# Patient Record
Sex: Male | Born: 1955
Health system: Southern US, Community
[De-identification: ages and names within clinical notes are randomized; demographics above are authoritative.]

## PROBLEM LIST (undated history)

## (undated) DIAGNOSIS — Z86718 Personal history of other venous thrombosis and embolism: Secondary | ICD-10-CM

## (undated) DIAGNOSIS — Z8709 Personal history of other diseases of the respiratory system: Secondary | ICD-10-CM

## (undated) DIAGNOSIS — T8859XA Other complications of anesthesia, initial encounter: Secondary | ICD-10-CM

## (undated) DIAGNOSIS — I499 Cardiac arrhythmia, unspecified: Secondary | ICD-10-CM

## (undated) DIAGNOSIS — A4101 Sepsis due to Methicillin susceptible Staphylococcus aureus: Secondary | ICD-10-CM

## (undated) DIAGNOSIS — M19132 Post-traumatic osteoarthritis, left wrist: Secondary | ICD-10-CM

## (undated) DIAGNOSIS — Z87442 Personal history of urinary calculi: Secondary | ICD-10-CM

## (undated) DIAGNOSIS — I8500 Esophageal varices without bleeding: Secondary | ICD-10-CM

## (undated) DIAGNOSIS — K2270 Barrett's esophagus without dysplasia: Secondary | ICD-10-CM

## (undated) DIAGNOSIS — I469 Cardiac arrest, cause unspecified: Secondary | ICD-10-CM

## (undated) DIAGNOSIS — Z8619 Personal history of other infectious and parasitic diseases: Secondary | ICD-10-CM

## (undated) DIAGNOSIS — D696 Thrombocytopenia, unspecified: Secondary | ICD-10-CM

## (undated) DIAGNOSIS — K449 Diaphragmatic hernia without obstruction or gangrene: Secondary | ICD-10-CM

## (undated) DIAGNOSIS — T4145XA Adverse effect of unspecified anesthetic, initial encounter: Secondary | ICD-10-CM

## (undated) DIAGNOSIS — K7581 Nonalcoholic steatohepatitis (NASH): Secondary | ICD-10-CM

## (undated) DIAGNOSIS — Z860101 Personal history of adenomatous and serrated colon polyps: Secondary | ICD-10-CM

## (undated) DIAGNOSIS — I5021 Acute systolic (congestive) heart failure: Secondary | ICD-10-CM

## (undated) DIAGNOSIS — T8453XA Infection and inflammatory reaction due to internal right knee prosthesis, initial encounter: Secondary | ICD-10-CM

## (undated) DIAGNOSIS — R7303 Prediabetes: Secondary | ICD-10-CM

## (undated) DIAGNOSIS — K7460 Unspecified cirrhosis of liver: Secondary | ICD-10-CM

## (undated) DIAGNOSIS — K219 Gastro-esophageal reflux disease without esophagitis: Secondary | ICD-10-CM

## (undated) DIAGNOSIS — M1990 Unspecified osteoarthritis, unspecified site: Secondary | ICD-10-CM

## (undated) DIAGNOSIS — Z9889 Other specified postprocedural states: Secondary | ICD-10-CM

## (undated) DIAGNOSIS — R579 Shock, unspecified: Secondary | ICD-10-CM

## (undated) DIAGNOSIS — Z8674 Personal history of sudden cardiac arrest: Secondary | ICD-10-CM

## (undated) DIAGNOSIS — Z86010 Personal history of colonic polyps: Secondary | ICD-10-CM

## (undated) DIAGNOSIS — K7469 Other cirrhosis of liver: Secondary | ICD-10-CM

## (undated) HISTORY — DX: Shock, unspecified: R57.9

## (undated) HISTORY — PX: TONSILLECTOMY: SUR1361

## (undated) HISTORY — PX: CHOLECYSTECTOMY, LAPAROSCOPIC: SHX56

## (undated) HISTORY — DX: Infection and inflammatory reaction due to internal right knee prosthesis, initial encounter: T84.53XA

## (undated) HISTORY — PX: REVISION TOTAL KNEE ARTHROPLASTY: SUR1280

## (undated) HISTORY — DX: Sepsis due to methicillin susceptible Staphylococcus aureus: A41.01

## (undated) HISTORY — PX: COLONOSCOPY: SHX174

## (undated) HISTORY — PX: CHOLECYSTECTOMY: SHX55

## (undated) HISTORY — PX: TOTAL KNEE ARTHROPLASTY: SHX125

## (undated) HISTORY — PX: ESOPHAGEAL DILATION: SHX303

---
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
## 1997-11-30 HISTORY — PX: INGUINAL HERNIA REPAIR: SUR1180

## 1998-07-02 DIAGNOSIS — Z9889 Other specified postprocedural states: Secondary | ICD-10-CM

## 1998-07-02 HISTORY — DX: Other specified postprocedural states: Z98.890

## 2001-06-28 ENCOUNTER — Emergency Department (HOSPITAL_COMMUNITY): Admission: EM | Admit: 2001-06-28 | Discharge: 2001-06-28 | Payer: Self-pay | Admitting: *Deleted

## 2001-07-06 ENCOUNTER — Emergency Department (HOSPITAL_COMMUNITY): Admission: EM | Admit: 2001-07-06 | Discharge: 2001-07-06 | Payer: Self-pay | Admitting: Emergency Medicine

## 2003-09-27 ENCOUNTER — Emergency Department (HOSPITAL_COMMUNITY): Admission: AD | Admit: 2003-09-27 | Discharge: 2003-09-27 | Payer: Self-pay | Admitting: Family Medicine

## 2006-07-09 ENCOUNTER — Ambulatory Visit: Payer: Self-pay | Admitting: Internal Medicine

## 2006-07-22 ENCOUNTER — Ambulatory Visit: Payer: Self-pay | Admitting: Internal Medicine

## 2006-07-22 ENCOUNTER — Ambulatory Visit (HOSPITAL_COMMUNITY): Admission: RE | Admit: 2006-07-22 | Discharge: 2006-07-22 | Payer: Self-pay | Admitting: Internal Medicine

## 2006-07-22 ENCOUNTER — Encounter (INDEPENDENT_AMBULATORY_CARE_PROVIDER_SITE_OTHER): Payer: Self-pay | Admitting: *Deleted

## 2009-03-03 ENCOUNTER — Ambulatory Visit: Payer: Self-pay | Admitting: Specialist

## 2009-03-16 ENCOUNTER — Inpatient Hospital Stay: Payer: Self-pay | Admitting: Specialist

## 2009-03-21 ENCOUNTER — Inpatient Hospital Stay: Payer: Self-pay | Admitting: Specialist

## 2009-03-23 HISTORY — PX: OTHER SURGICAL HISTORY: SHX169

## 2010-01-30 ENCOUNTER — Inpatient Hospital Stay (HOSPITAL_COMMUNITY): Admission: RE | Admit: 2010-01-30 | Discharge: 2010-02-01 | Payer: Self-pay | Admitting: Orthopedic Surgery

## 2010-01-30 HISTORY — PX: KNEE ARTHROSCOPY W/ SYNOVECTOMY: SHX1887

## 2010-07-02 HISTORY — PX: OTHER SURGICAL HISTORY: SHX169

## 2010-09-12 ENCOUNTER — Encounter (HOSPITAL_COMMUNITY)
Admission: RE | Admit: 2010-09-12 | Discharge: 2010-09-12 | Disposition: A | Discharge status: Home / Self Care | Payer: BC Managed Care – PPO | Source: Ambulatory Visit | Attending: Orthopedic Surgery | Admitting: Orthopedic Surgery

## 2010-09-12 DIAGNOSIS — Z01818 Encounter for other preprocedural examination: Secondary | ICD-10-CM | POA: Insufficient documentation

## 2010-09-12 DIAGNOSIS — Z01812 Encounter for preprocedural laboratory examination: Secondary | ICD-10-CM | POA: Insufficient documentation

## 2010-09-12 LAB — URINALYSIS, ROUTINE W REFLEX MICROSCOPIC
Bilirubin Urine: NEGATIVE
Hgb urine dipstick: NEGATIVE
Nitrite: NEGATIVE
Specific Gravity, Urine: 1.017 (ref 1.005–1.030)
pH: 6 (ref 5.0–8.0)

## 2010-09-12 LAB — COMPREHENSIVE METABOLIC PANEL
ALT: 82 U/L — ABNORMAL HIGH (ref 0–53)
Alkaline Phosphatase: 163 U/L — ABNORMAL HIGH (ref 39–117)
BUN: 8 mg/dL (ref 6–23)
Calcium: 8.9 mg/dL (ref 8.4–10.5)
Chloride: 105 mEq/L (ref 96–112)
Total Protein: 7 g/dL (ref 6.0–8.3)

## 2010-09-12 LAB — PROTIME-INR
INR: 0.89 (ref 0.00–1.49)
Prothrombin Time: 12.2 seconds (ref 11.6–15.2)

## 2010-09-12 LAB — DIFFERENTIAL
Basophils Absolute: 0 10*3/uL (ref 0.0–0.1)
Eosinophils Absolute: 0.3 10*3/uL (ref 0.0–0.7)
Eosinophils Relative: 7 % — ABNORMAL HIGH (ref 0–5)
Lymphocytes Relative: 23 % (ref 12–46)
Lymphs Abs: 0.9 10*3/uL (ref 0.7–4.0)
Monocytes Relative: 11 % (ref 3–12)
Neutro Abs: 2.4 10*3/uL (ref 1.7–7.7)

## 2010-09-12 LAB — SURGICAL PCR SCREEN
MRSA, PCR: NEGATIVE
Staphylococcus aureus: NEGATIVE

## 2010-09-12 LAB — CBC
Hemoglobin: 17.1 g/dL — ABNORMAL HIGH (ref 13.0–17.0)
WBC: 4 10*3/uL (ref 4.0–10.5)

## 2010-09-15 LAB — CBC
HCT: 41.8 % (ref 39.0–52.0)
HCT: 42.4 % (ref 39.0–52.0)
MCH: 30.6 pg (ref 26.0–34.0)
MCH: 32.1 pg (ref 26.0–34.0)
MCHC: 33.7 g/dL (ref 30.0–36.0)
MCHC: 35.6 g/dL (ref 30.0–36.0)
Platelets: 159 10*3/uL (ref 150–400)
Platelets: 167 10*3/uL (ref 150–400)
RBC: 4.61 MIL/uL (ref 4.22–5.81)
RDW: 13.6 % (ref 11.5–15.5)
WBC: 6 10*3/uL (ref 4.0–10.5)

## 2010-09-15 LAB — CROSSMATCH

## 2010-09-15 LAB — BASIC METABOLIC PANEL
BUN: 9 mg/dL (ref 6–23)
BUN: 9 mg/dL (ref 6–23)
CO2: 27 mEq/L (ref 19–32)
CO2: 28 mEq/L (ref 19–32)
Creatinine, Ser: 0.99 mg/dL (ref 0.4–1.5)
GFR calc non Af Amer: >60 mL/min (ref >60)
Potassium: 3.7 mEq/L (ref 3.5–5.1)

## 2010-09-16 LAB — URINALYSIS, ROUTINE W REFLEX MICROSCOPIC
Hgb urine dipstick: NEGATIVE
Ketones, ur: NEGATIVE mg/dL
Protein, ur: NEGATIVE mg/dL
Specific Gravity, Urine: 1.021 (ref 1.005–1.030)
pH: 7 (ref 5.0–8.0)

## 2010-09-16 LAB — CBC
HCT: 47.9 % (ref 39.0–52.0)
MCV: 91.7 fL (ref 78.0–100.0)
RBC: 5.22 MIL/uL (ref 4.22–5.81)
RDW: 14.2 % (ref 11.5–15.5)
WBC: 4.8 10*3/uL (ref 4.0–10.5)

## 2010-09-16 LAB — COMPREHENSIVE METABOLIC PANEL
AST: 32 U/L (ref 0–37)
Alkaline Phosphatase: 92 U/L (ref 39–117)
CO2: 31 mEq/L (ref 19–32)
Calcium: 9.1 mg/dL (ref 8.4–10.5)
GFR calc non Af Amer: >60 mL/min (ref >60)
Glucose, Bld: 107 mg/dL — ABNORMAL HIGH (ref 70–99)
Sodium: 141 mEq/L (ref 135–145)

## 2010-09-16 LAB — SURGICAL PCR SCREEN
MRSA, PCR: NEGATIVE
Staphylococcus aureus: NEGATIVE

## 2010-09-16 LAB — APTT: aPTT: 27 seconds (ref 24–37)

## 2010-09-16 LAB — DIFFERENTIAL
Basophils Absolute: 0 10*3/uL (ref 0.0–0.1)
Basophils Relative: 1 % (ref 0–1)
Monocytes Relative: 9 % (ref 3–12)

## 2010-09-16 LAB — PROTIME-INR: Prothrombin Time: 12.7 seconds (ref 11.6–15.2)

## 2010-09-16 LAB — URINE CULTURE: Culture: NO GROWTH

## 2010-09-18 ENCOUNTER — Inpatient Hospital Stay (HOSPITAL_COMMUNITY)
Admission: RE | Admit: 2010-09-18 | Discharge: 2010-09-20 | DRG: 209 | Disposition: A | Discharge status: Home Health | Payer: BC Managed Care – PPO | Source: Ambulatory Visit | Attending: Orthopedic Surgery | Admitting: Orthopedic Surgery

## 2010-09-18 DIAGNOSIS — Z888 Allergy status to other drugs, medicaments and biological substances status: Secondary | ICD-10-CM

## 2010-09-18 DIAGNOSIS — K219 Gastro-esophageal reflux disease without esophagitis: Secondary | ICD-10-CM | POA: Diagnosis present

## 2010-09-18 DIAGNOSIS — Z01812 Encounter for preprocedural laboratory examination: Secondary | ICD-10-CM

## 2010-09-18 DIAGNOSIS — D62 Acute posthemorrhagic anemia: Secondary | ICD-10-CM | POA: Diagnosis not present

## 2010-09-18 DIAGNOSIS — Z96659 Presence of unspecified artificial knee joint: Secondary | ICD-10-CM

## 2010-09-18 DIAGNOSIS — Z880 Allergy status to penicillin: Secondary | ICD-10-CM

## 2010-09-18 DIAGNOSIS — M1710 Unilateral primary osteoarthritis, unspecified knee: Principal | ICD-10-CM | POA: Diagnosis present

## 2010-09-18 LAB — CROSSMATCH
Antibody Screen: NEGATIVE
Unit division: 0

## 2010-09-18 LAB — HEPATIC FUNCTION PANEL
ALT: 60 U/L — ABNORMAL HIGH (ref 0–53)
Alkaline Phosphatase: 167 U/L — ABNORMAL HIGH (ref 39–117)
Indirect Bilirubin: 0.8 mg/dL (ref 0.3–0.9)

## 2010-09-19 LAB — CBC
HCT: 40.3 % (ref 39.0–52.0)
Hemoglobin: 13.8 g/dL (ref 13.0–17.0)
MCHC: 34.2 g/dL (ref 30.0–36.0)
RBC: 4.53 MIL/uL (ref 4.22–5.81)

## 2010-09-19 LAB — BASIC METABOLIC PANEL
CO2: 30 mEq/L (ref 19–32)
Calcium: 7.9 mg/dL — ABNORMAL LOW (ref 8.4–10.5)
Chloride: 103 mEq/L (ref 96–112)
GFR calc Af Amer: >60 mL/min (ref >60)
Glucose, Bld: 105 mg/dL — ABNORMAL HIGH (ref 70–99)
Sodium: 137 mEq/L (ref 135–145)

## 2010-09-20 LAB — BASIC METABOLIC PANEL
BUN: 7 mg/dL (ref 6–23)
CO2: 31 mEq/L (ref 19–32)
Calcium: 8.2 mg/dL — ABNORMAL LOW (ref 8.4–10.5)
Chloride: 102 mEq/L (ref 96–112)
Creatinine, Ser: 1.01 mg/dL (ref 0.4–1.5)
Glucose, Bld: 112 mg/dL — ABNORMAL HIGH (ref 70–99)

## 2010-09-20 LAB — CBC
HCT: 37 % — ABNORMAL LOW (ref 39.0–52.0)
Hemoglobin: 12.7 g/dL — ABNORMAL LOW (ref 13.0–17.0)
MCH: 30.2 pg (ref 26.0–34.0)
MCHC: 34.3 g/dL (ref 30.0–36.0)
MCV: 87.9 fL (ref 78.0–100.0)
RBC: 4.21 MIL/uL — ABNORMAL LOW (ref 4.22–5.81)

## 2010-09-21 LAB — CROSSMATCH: Unit division: 0

## 2010-10-10 NOTE — Op Note (Signed)
NAME:  Stephen Burch, [place]                ACCOUNT NO.:  [id number]  MEDICAL RECORD NO.:  [id number]           PATIENT TYPE:  I  LOCATION:  5002                         FACILITY:  [facility]  PHYSICIAN:  Mila Homer. Sherlean Foot, M.D. DATE OF BIRTH:  [birth year]  DATE OF PROCEDURE:  09/18/2010 DATE OF DISCHARGE:  09/20/2010                              OPERATIVE REPORT   SURGEON:  Mila Homer. Sherlean Foot, MD.  ASSISTANT:  Altamese Cabal, PA-C.  ANESTHESIA:  General.  PREOPERATIVE DIAGNOSIS:  Left knee osteoarthritis.  POSTOPERATIVE DIAGNOSIS:  Left knee osteoarthritis.  PROCEDURE:  Left total knee arthroplasty.  INDICATIONS FOR PROCEDURE:  The patient is a 55 year old white male with failure of conservative measures for osteoarthritis of the left knee. Informed consent was obtained.  DESCRIPTION OF PROCEDURE:  The patient was laid supine, administered general anesthesia.  The left leg was prepped and draped in usual sterile fashion.  Left leg exsanguinated with the Esmarch and tourniquet inflated to 350 mmHg.  A #10 blade was used to make a midline incision and a new blade to make a median parapatellar arthrotomy and performed a synovectomy.  I then elevated the deep MCL off the medial crest of the tibia around to and through the semimembranosus attachment since this was a bad varus knee.  I then everted the patella, it measured 25-mm thick.  I reamed down 8.5 mm with a reamer and drilled 3 lug holes through the 35-mm template with a prosthetic trial in place and recreated a 25-mm thickness.  I then removed the prosthetic trial, subluxed the patella laterally and went into flexion.  I used the extramedullary alignment system on the tibia to make perpendicular cut to the anatomic axis of the tibia and 3 degrees of posterior slope.  I then used the intramedullary system on the femur to make a 6-degree valgus cut as this again was a varus knee.  I then marked out the epicondylar axis and the posterior  condylar angle measured 3 degrees.  I sized to size E pinned through the 3 degrees external rotation holes and passed the formal cutting block to the femur.  I then made the anterior- posterior chamfer cuts with sagittal saw.  I then placed a lamina spreader in the knee of the ACL, PCL, medial lateral menisci, and posterior condylar osteophytes.  I then placed a 12-mm spacer block and had good flexion/extension gap balance.  I did have to drill a little bit more medial releasing distally.  I then finished size E finishing block, the tibia with a size 6 tibial tray drilling keel.  I then trialed with a E femur, 6 tibia, 12 insert, 35 patella, had good flexion/extension gap balance and good patellar tracking.  I then removed the components and copiously irrigated.  I then cemented in the components, removing all excess cement, and allowing the cement to harden in extension.  I then let the tourniquet down and obtained hemostasis.  I left the Hemovac coming out superolaterally and deep to the arthrotomy.  Pain catheter coming out supermedial and superficial to the arthrotomy.  I then closed the arthrotomy with figure-of-eight #  1 Vicryl sutures, buried 0 Vicryl sutures, subcuticular 2-0 Vicryl stitch, and skin staples.  Dressed with Xeroform, dressing sponges, sterile Webril, and TED stocking.  COMPLICATIONS:  None.  DRAINS:  One Hemovac.  ESTIMATED BLOOD LOSS:  300 mL.          ______________________________ Mila Homer. Sherlean Foot, M.D.     SDL/MEDQ  D:  09/22/2010  T:  09/23/2010  Job:  [id number]  Electronically Signed by Georgena Spurling M.D. on 10/10/2010 02:17:54 PM

## 2010-10-18 ENCOUNTER — Ambulatory Visit (HOSPITAL_COMMUNITY)
Admission: RE | Admit: 2010-10-18 | Discharge: 2010-10-18 | Disposition: A | Discharge status: Home / Self Care | Payer: BC Managed Care – PPO | Source: Ambulatory Visit | Attending: Orthopedic Surgery | Admitting: Orthopedic Surgery

## 2010-10-18 DIAGNOSIS — M6281 Muscle weakness (generalized): Secondary | ICD-10-CM | POA: Insufficient documentation

## 2010-10-18 DIAGNOSIS — IMO0001 Reserved for inherently not codable concepts without codable children: Secondary | ICD-10-CM | POA: Insufficient documentation

## 2010-10-18 DIAGNOSIS — R262 Difficulty in walking, not elsewhere classified: Secondary | ICD-10-CM | POA: Insufficient documentation

## 2010-10-18 DIAGNOSIS — M25569 Pain in unspecified knee: Secondary | ICD-10-CM | POA: Insufficient documentation

## 2010-10-18 DIAGNOSIS — M25669 Stiffness of unspecified knee, not elsewhere classified: Secondary | ICD-10-CM | POA: Insufficient documentation

## 2010-10-20 ENCOUNTER — Ambulatory Visit (HOSPITAL_COMMUNITY)
Admission: RE | Admit: 2010-10-20 | Discharge: 2010-10-20 | Disposition: A | Discharge status: Home / Self Care | Payer: BC Managed Care – PPO | Source: Ambulatory Visit

## 2010-10-23 ENCOUNTER — Ambulatory Visit (HOSPITAL_COMMUNITY)
Admission: RE | Admit: 2010-10-23 | Discharge: 2010-10-23 | Disposition: A | Discharge status: Home / Self Care | Payer: BC Managed Care – PPO | Source: Ambulatory Visit | Attending: Family Medicine | Admitting: Family Medicine

## 2010-10-25 ENCOUNTER — Ambulatory Visit (HOSPITAL_COMMUNITY): Payer: BC Managed Care – PPO | Admitting: Physical Therapy

## 2010-10-27 ENCOUNTER — Ambulatory Visit (HOSPITAL_COMMUNITY): Payer: BC Managed Care – PPO | Admitting: Physical Therapy

## 2010-10-30 ENCOUNTER — Ambulatory Visit (HOSPITAL_COMMUNITY): Payer: BC Managed Care – PPO | Admitting: *Deleted

## 2010-11-01 ENCOUNTER — Ambulatory Visit (HOSPITAL_COMMUNITY): Payer: BC Managed Care – PPO | Admitting: Physical Therapy

## 2010-11-03 ENCOUNTER — Ambulatory Visit (HOSPITAL_COMMUNITY): Payer: BC Managed Care – PPO | Admitting: Physical Therapy

## 2010-11-06 ENCOUNTER — Ambulatory Visit (HOSPITAL_COMMUNITY)
Admission: RE | Admit: 2010-11-06 | Discharge: 2010-11-06 | Disposition: A | Discharge status: Home / Self Care | Payer: BC Managed Care – PPO | Source: Ambulatory Visit | Attending: Orthopedic Surgery | Admitting: Orthopedic Surgery

## 2010-11-06 DIAGNOSIS — M25569 Pain in unspecified knee: Secondary | ICD-10-CM | POA: Insufficient documentation

## 2010-11-06 DIAGNOSIS — R262 Difficulty in walking, not elsewhere classified: Secondary | ICD-10-CM | POA: Insufficient documentation

## 2010-11-06 DIAGNOSIS — M25669 Stiffness of unspecified knee, not elsewhere classified: Secondary | ICD-10-CM | POA: Insufficient documentation

## 2010-11-06 DIAGNOSIS — M6281 Muscle weakness (generalized): Secondary | ICD-10-CM | POA: Insufficient documentation

## 2010-11-06 DIAGNOSIS — IMO0001 Reserved for inherently not codable concepts without codable children: Secondary | ICD-10-CM | POA: Insufficient documentation

## 2010-11-09 ENCOUNTER — Ambulatory Visit (HOSPITAL_COMMUNITY)
Admission: RE | Admit: 2010-11-09 | Discharge: 2010-11-09 | Disposition: A | Discharge status: Home / Self Care | Payer: BC Managed Care – PPO | Source: Ambulatory Visit | Attending: Orthopedic Surgery | Admitting: Orthopedic Surgery

## 2010-11-09 DIAGNOSIS — M25669 Stiffness of unspecified knee, not elsewhere classified: Secondary | ICD-10-CM | POA: Insufficient documentation

## 2010-11-09 DIAGNOSIS — M25569 Pain in unspecified knee: Secondary | ICD-10-CM | POA: Insufficient documentation

## 2010-11-09 DIAGNOSIS — IMO0001 Reserved for inherently not codable concepts without codable children: Secondary | ICD-10-CM | POA: Insufficient documentation

## 2010-11-09 DIAGNOSIS — R262 Difficulty in walking, not elsewhere classified: Secondary | ICD-10-CM | POA: Insufficient documentation

## 2010-11-09 DIAGNOSIS — M6281 Muscle weakness (generalized): Secondary | ICD-10-CM | POA: Insufficient documentation

## 2010-11-17 NOTE — Op Note (Signed)
NAME:  Stephen Burch, Stephen Burch                ACCOUNT NO.:  [id number]   MEDICAL RECORD NO.:  [id number]          PATIENT TYPE:  AMB   LOCATION:  DAY                           FACILITY:  [facility]   PHYSICIAN:  Lionel December, M.D.    DATE OF BIRTH:  1956/02/22   DATE OF PROCEDURE:  07/22/2006  DATE OF DISCHARGE:                               OPERATIVE REPORT   PROCEDURE:  Esophagogastroduodenoscopy with esophageal dilation followed  by colonoscopy with polypectomy.   INDICATION:  Stephen Burch is a 55 year old Caucasian male who presents with  few months history of solid food dysphagia.  He denies heartburn.  His  last EGD was in 1995 and he had distal esophageal ring, erosive reflux  esophagitis and small sliding hiatal hernia.  However, he did not take  PPI for long.  He is also undergoing screening colonoscopy.  Procedure  risks were reviewed with the patient, informed consent was obtained.   MEDS FOR CONSCIOUS SEDATION:  Benzocaine spray for pharyngeal topical  anesthesia, Demerol 50 mg IV, Versed 8 mg IV in divided dose.   FINDINGS:  Procedure performed in endoscopy suite.  The patient's vital  signs and O2 sat were monitored during procedure remained stable.   PROCEDURE:  1. Esophagogastroduodenoscopy. The patient was placed left lateral      position and Pentax videoscope was passed via oropharynx without      any difficulty into esophagus.   Esophagus. Mucosa of the esophagus normal.  There is prominent ring  noted at GE junction and two rings proximal to it which were noncritical  and incomplete.  GE junction was at 38 cm from the incisors and hiatus  was at 41.  Mucosa of the herniated part of the stomach was erythematous  and there was 5-6 mm ulcer just below the ring involving the gastric  mucosa.  Pictures taken for the record.   Stomach was empty and distended very well with insufflation.  Folds  proximal stomach were normal.  Examination mucosa revealed a 5-mm polyp  at gastric body  along the posterior wall which was ablated via cold  biopsy.  There was patchy antral erythema and some granularity but no  erosions or ulcers noted.  Pyloric channel was patent.  Angularis,  fundus and cardia examined by retroflexing the scope and were normal.   Duodenum. Bulbar mucosa was normal.  Scope was passed second part of  duodenum where mucosa and folds were normal.  Ampulla of Vater was also  easily seen and appeared to be unremarkable.   Endoscope was withdrawn in body of the stomach.  Balloon dilator was  passed through the scope.  The guidewire was pushed in the gastric  lumen.  Scope was withdrawn in the body of the esophagus and this ring  was dilated initially to 18 mm and subsequently to 19 mm with the  balloon.  This seemed to disrupt the ring.  Pictures taken for record.  Endoscope was withdrawn.  The patient prepared for procedure #2.   1. Colonoscopy.  Rectal examination performed.  No abnormality noted  on external or digital exam.  Pentax videoscope was placed in      rectum and advanced under vision into sigmoid colon beyond.      Preparation was satisfactory.  Scope was passed into cecum which      was identified by appendiceal orifice and ileocecal valve.      Pictures taken for the record.  As the scope was withdrawn colonic      mucosa was carefully examined.  There was 6 x 8 mm sessile polyp at      hepatic flexure which was snared after elevating it with submucosal      injection of normal saline.  Polypectomy was complete.  There was      oozing noted from polypectomy site which was coagulated with the      snare tip with good hemostasis.  Pictures taken for the record.      Mucosa rest of the colon was carefully examined and no other      abnormalities noted.  Rectal mucosa was normal.  Scope was      retroflexed to examine anorectal junction which was unremarkable.      Endoscope was then straightened and withdrawn.  The patient      tolerated the  procedure well.   FINAL DIAGNOSIS:  1. Distal esophageal ring dilated/disrupted with 18/19 mL balloon.  2. Small sliding hiatal hernia with 5-6 mm ulcer involving the      herniated part of the stomach just below the ring.  This      endoscopically appeared to be benign and was not biopsied.  3. A 5 mm gastric polyp at body ablated via cold biopsy.  4. Nonerosive antral gastritis and bulbar duodenitis.  5. 6 x 8 mm sessile polyp snared from hepatic flexure as described      above.   RECOMMENDATIONS:  1. Instructions given.  2. Antireflux measures.  3. Omeprazole 20 mg p.o. b.i.d.  4. He will return for follow-up EGD in 12 weeks.  Once his esophagitis      has healed completely we will drop PPI dose to once a day.  5. H pylori serology will be checked today.      Lionel December, M.D.  Electronically Signed     NR/MEDQ  D:  07/22/2006  T:  07/22/2006  Job:  [id number]   cc:   Dr. Sherrie Mustache [facility]

## 2010-11-17 NOTE — H&P (Signed)
NAME:  Stephen Burch, Stephen Burch                ACCOUNT NO.:  [id number]   MEDICAL RECORD NO.:  [id number]         PATIENT TYPE:  AMB   LOCATION:                                FACILITY:  [facility]   PHYSICIAN:  Lionel December, M.D.    DATE OF BIRTH:  09-18-1955   DATE OF ADMISSION:  DATE OF DISCHARGE:  LH                              HISTORY & PHYSICAL   CHIEF COMPLAINT:  Dysphagia, screening colonoscopy.   HISTORY OF PRESENT ILLNESS:  Stephen Burch is a 55 year old Caucasian male  who has a history of esophageal dysphagia, with a last EGD in 1995.  He  was found to have a distal esophageal ring, along with erosive reflux  esophagitis and a small sliding hiatal hernia.  His esophagus was  dilated to 18 mm using a balloon dilator.  He has not had any further  problems until approximately three weeks ago.  He began to develop  dysphagia to both solids and liquids.  He feels as though his food is  getting stuck mid-esophagus.  He has occasionally had to make himself  regurgitate his food, as he felt like it was not going to go down.  He  denies any odynophagia.  He denies anorexia.  He denies any heartburn or  indigestion.  He does have daily soft brown bowel movements.  He denies  rectal bleeding or melena.   PAST MEDICAL HISTORY:  Chronic GERD with dysphagia, see HPI, bilateral  inguinal herniorrhaphies, tonsillectomy.   CURRENT MEDICATIONS:  Denies.   ALLERGIES:  Denies any allergies to aspirin or penicillin.   FAMILY HISTORY:  No known family history of colorectal carcinoma, liver  or GI problems.  Mother age 31 is healthy.  Father age 55 has arthritis.  He has three healthy siblings.   SOCIAL HISTORY:  He is married with two healthy grandchildren.  He is  employed with [employer]. He denies any tobacco or drug use.  He  occasionally consumes a couple alcoholic beverages per month.   REVIEW OF SYSTEMS:  CONSTITUTIONAL:  Weight is stable, denies any  fatigue.  CARDIOVASCULAR:  Denies any chest  pain or palpitations.  PULMONARY:  Denies shortness of breath, dizziness, cough or hemoptysis.  GI:  See HPI, denies any diarrhea, constipation, or abdominal pain.  GU:  Denies any dysuria, hematuria.   PHYSICAL EXAMINATION:  VITAL SIGNS:  Weight 201, height 72, temp 98.2,  blood pressure 120/82 and pulse 70.  GENERAL:  Stephen Burch is a well-developed, well-nourished, Caucasian male  in no acute distress.  HEENT:  __________ are clear, nonicteric sclerae, conjunctivae pink.  Oropharynx pink and moist without any lesions.  NECK:  Supple without mass or thyromegaly.  HEART:  Regular rate and rhythm. Normal S1, S2.  ABDOMEN:  Positive bowel sounds x4.  No bruits auscultated, soft  nontender, nondistended without palpable mass or hepatosplenomegaly, no  rebound tenderness or guarding.  EXTREMITIES:  Without clubbing or edema bilaterally.  SKIN:  Pink, warm and dry, without any rashes or jaundice.   IMPRESSION:  Stephen Burch is a 55 year old Caucasian male with a recurrent  esophageal dysphagia.  I suspect recurrent esophageal ring.  It has been  over 12 years since he was last dilated.  He also would like to have  screening colonoscopy at the same time.   PLAN:  1. EGD with ED and screening colonoscopy with Dr. Karilyn Cota in the near      future.  We have discussed this procedure including risks and      benefits which include, but are not limited to, the risk of      bleeding, infection, perforation, drug reaction.  He agrees with      plan, consent obtained.  2. Further recommendations pending procedure.      Nicholas Lose, N.P.      Lionel December, M.D.  Electronically Signed    KC/MEDQ  D:  07/09/2006  T:  07/09/2006  Job:  [id number]

## 2010-12-21 ENCOUNTER — Ambulatory Visit (HOSPITAL_COMMUNITY)
Admission: RE | Admit: 2010-12-21 | Discharge: 2010-12-21 | Disposition: A | Discharge status: Home / Self Care | Payer: BC Managed Care – PPO | Source: Ambulatory Visit | Attending: Orthopedic Surgery | Admitting: Orthopedic Surgery

## 2010-12-21 DIAGNOSIS — IMO0001 Reserved for inherently not codable concepts without codable children: Secondary | ICD-10-CM | POA: Insufficient documentation

## 2010-12-21 DIAGNOSIS — M25569 Pain in unspecified knee: Secondary | ICD-10-CM | POA: Insufficient documentation

## 2010-12-21 DIAGNOSIS — M25669 Stiffness of unspecified knee, not elsewhere classified: Secondary | ICD-10-CM | POA: Insufficient documentation

## 2010-12-21 DIAGNOSIS — R262 Difficulty in walking, not elsewhere classified: Secondary | ICD-10-CM | POA: Insufficient documentation

## 2010-12-21 DIAGNOSIS — M6281 Muscle weakness (generalized): Secondary | ICD-10-CM | POA: Insufficient documentation

## 2010-12-27 ENCOUNTER — Ambulatory Visit (HOSPITAL_COMMUNITY)
Admission: RE | Admit: 2010-12-27 | Discharge: 2010-12-27 | Disposition: A | Discharge status: Home / Self Care | Payer: BC Managed Care – PPO | Source: Ambulatory Visit | Attending: Family Medicine | Admitting: Family Medicine

## 2011-01-01 ENCOUNTER — Ambulatory Visit (HOSPITAL_COMMUNITY)
Admission: RE | Admit: 2011-01-01 | Discharge: 2011-01-01 | Disposition: A | Discharge status: Home / Self Care | Payer: BC Managed Care – PPO | Source: Ambulatory Visit | Attending: Orthopedic Surgery | Admitting: Orthopedic Surgery

## 2011-01-01 DIAGNOSIS — M6281 Muscle weakness (generalized): Secondary | ICD-10-CM | POA: Insufficient documentation

## 2011-01-01 DIAGNOSIS — M25669 Stiffness of unspecified knee, not elsewhere classified: Secondary | ICD-10-CM | POA: Insufficient documentation

## 2011-01-01 DIAGNOSIS — M25569 Pain in unspecified knee: Secondary | ICD-10-CM | POA: Insufficient documentation

## 2011-01-01 DIAGNOSIS — R262 Difficulty in walking, not elsewhere classified: Secondary | ICD-10-CM | POA: Insufficient documentation

## 2011-01-01 DIAGNOSIS — IMO0001 Reserved for inherently not codable concepts without codable children: Secondary | ICD-10-CM | POA: Insufficient documentation

## 2011-01-09 ENCOUNTER — Ambulatory Visit (HOSPITAL_COMMUNITY)
Admission: RE | Admit: 2011-01-09 | Discharge: 2011-01-09 | Disposition: A | Discharge status: Home / Self Care | Payer: BC Managed Care – PPO | Source: Ambulatory Visit | Attending: Family Medicine | Admitting: Family Medicine

## 2011-01-09 DIAGNOSIS — R262 Difficulty in walking, not elsewhere classified: Secondary | ICD-10-CM | POA: Insufficient documentation

## 2011-01-09 DIAGNOSIS — M25569 Pain in unspecified knee: Secondary | ICD-10-CM | POA: Insufficient documentation

## 2011-01-09 NOTE — Progress Notes (Signed)
Physical Therapy Treatment Patient Name: Stephen Burch Date: 01/09/2011  Subjective: pain free at entrance   OBJECTIVE: PROM 0-118 degrees  Exercise/Treatments: Gastrocnemius st: 3x 30", Soleus st: 3x30", Biodex isokinetic at 45 degrees/second with 2x 10 reps., wall slides 10x 10" holds, Forward lunge walking 2 RT for balance, heel/toe walking 2RT for balance, Added tandem stance for balance, Supine TKE 10x10" for increased ROM, heel slides x 20 reps, with PROM following 3x30" holds each direction with PROM Measurement 0-118 degrees.  Assessment: Added tandem gait for increase balance, pt. able to regain 2 LOB episodes independently without Assistance.  All therapeutic exercises/balance activities completed without difficulty.  Plan: Re-eval next session.   End of Session There is no problem list on file for this patient.      Aldona Lento 01/09/2011, 8:10 AM

## 2011-01-16 ENCOUNTER — Ambulatory Visit (HOSPITAL_COMMUNITY)
Admission: RE | Admit: 2011-01-16 | Discharge: 2011-01-16 | Disposition: A | Discharge status: Home / Self Care | Payer: BC Managed Care – PPO | Source: Ambulatory Visit | Attending: Family Medicine | Admitting: Family Medicine

## 2011-01-16 DIAGNOSIS — R262 Difficulty in walking, not elsewhere classified: Secondary | ICD-10-CM | POA: Insufficient documentation

## 2011-01-16 DIAGNOSIS — M25669 Stiffness of unspecified knee, not elsewhere classified: Secondary | ICD-10-CM

## 2011-01-16 DIAGNOSIS — M25569 Pain in unspecified knee: Secondary | ICD-10-CM | POA: Insufficient documentation

## 2011-01-16 NOTE — Progress Notes (Signed)
Physical Therapy Treatment Patient Name: BENTLIE CATANZARO VPXTG'G Date: 01/16/2011 Time In: 801 Time Out 840 Charges: 1 ROM, 25 TE Physical Therapy Discharge Summary  Diagnosis: L TKR after manipulation ICD-9 YIRS:854.62, 719.56 Referring practitioner: Lucey Date of last MD visit: 01/06/11 Date of initial PT Visit:  Patient seen for 10 sessions after manipulation at this location   Subjective:    Patient's response to therapy: Pt reports that his pain is no greater then a 2/10.  He can stand for a few hours without an increase in pain, he can walk for at least an hour, no difficulty driving or sitting.    Objective:   Current condition:  LLE:  MMT:WNL AROM : 0-108 degrees PROM 0-118 degrees Balance: L SLS: 30 sec.  R SLS: 20 sec. Power: 5 Sit to stands: 9.6 sec, Stairs: reciprocal w/1 handrail, with decreased speed with descending stairs, Sqauts 5 times without pain.     Gait: Continues to have decreased knee flexion with gait.   Flexibility: Pt has improved hamstring and ITB length.     Edema: Not present   Assessment:   Summary/analysis of evaluation: Pt has improved strength, flexibility, power, balance and education on how to continue w/HEP at home.  Pt continues to have difficulty with AROM during gait.   Progress toward previous goals: Pt has met all STG and LTG for therapy.    Plan:   D/C w/comprehensive HEP   Exercise:  Bike x6 minutes 3.0  STANDING:   [place] 1x ascending and descending with 1 handrail  Heel Raise w/L eccentric lowering 2x10  Gastroc Stretch 3x30 sec  Chair Stretch 3x30 sec.   5 STS: 9.4 sec.  R SLS: 20 sec.  L SLS 30 sec.   Heel Walks 3 RT  Toe Walks 3 RT PRONE:   Knee flexion 5# 3x10  SUPINE:  Active Hamstring Stretch 3x30 sec.   ITB stretch 3x30 sec     Goals PT Short Term Goals Short Term Goal 1 Progress: Met Short Term Goal 3 Progress: Met PT Long Term Goals Long Term Goal 2 Progress: Met End of Session Patient Active Problem  List  Diagnoses  . Pain in joint, lower leg  . Stiffness of joint, not elsewhere classified, lower leg  . Difficulty in walking       Tanina Barb 01/16/2011, 8:16 AM

## 2012-06-23 ENCOUNTER — Other Ambulatory Visit (HOSPITAL_COMMUNITY): Payer: Self-pay | Admitting: Urology

## 2012-06-23 DIAGNOSIS — I861 Scrotal varices: Secondary | ICD-10-CM

## 2012-06-27 ENCOUNTER — Ambulatory Visit (HOSPITAL_COMMUNITY)
Admission: RE | Admit: 2012-06-27 | Discharge: 2012-06-27 | Disposition: A | Discharge status: Home / Self Care | Payer: Medicare Other | Source: Ambulatory Visit | Attending: Urology | Admitting: Urology

## 2012-06-27 DIAGNOSIS — K7460 Unspecified cirrhosis of liver: Secondary | ICD-10-CM | POA: Insufficient documentation

## 2012-06-27 DIAGNOSIS — R300 Dysuria: Secondary | ICD-10-CM | POA: Insufficient documentation

## 2012-06-27 DIAGNOSIS — K8020 Calculus of gallbladder without cholecystitis without obstruction: Secondary | ICD-10-CM | POA: Insufficient documentation

## 2012-06-27 DIAGNOSIS — I861 Scrotal varices: Secondary | ICD-10-CM | POA: Insufficient documentation

## 2012-06-27 DIAGNOSIS — K4090 Unilateral inguinal hernia, without obstruction or gangrene, not specified as recurrent: Secondary | ICD-10-CM | POA: Insufficient documentation

## 2012-06-27 DIAGNOSIS — N200 Calculus of kidney: Secondary | ICD-10-CM | POA: Insufficient documentation

## 2012-06-27 MED ORDER — IOHEXOL 300 MG/ML  SOLN
100.0000 mL | Freq: Once | INTRAMUSCULAR | Status: AC | PRN
  Administered 2012-06-27: 100 mL via INTRAVENOUS

## 2012-07-04 ENCOUNTER — Emergency Department (HOSPITAL_COMMUNITY): Payer: Medicare Other

## 2012-07-04 ENCOUNTER — Encounter (HOSPITAL_COMMUNITY): Payer: Self-pay | Admitting: Emergency Medicine

## 2012-07-04 ENCOUNTER — Emergency Department (HOSPITAL_COMMUNITY)
Admission: EM | Admit: 2012-07-04 | Discharge: 2012-07-04 | Disposition: A | Discharge status: Home / Self Care | Payer: Medicare Other | Attending: Emergency Medicine | Admitting: Emergency Medicine

## 2012-07-04 DIAGNOSIS — J189 Pneumonia, unspecified organism: Secondary | ICD-10-CM

## 2012-07-04 DIAGNOSIS — J45909 Unspecified asthma, uncomplicated: Secondary | ICD-10-CM | POA: Insufficient documentation

## 2012-07-04 DIAGNOSIS — R059 Cough, unspecified: Secondary | ICD-10-CM | POA: Insufficient documentation

## 2012-07-04 DIAGNOSIS — R05 Cough: Secondary | ICD-10-CM | POA: Insufficient documentation

## 2012-07-04 DIAGNOSIS — Z87442 Personal history of urinary calculi: Secondary | ICD-10-CM | POA: Insufficient documentation

## 2012-07-04 DIAGNOSIS — R509 Fever, unspecified: Secondary | ICD-10-CM | POA: Insufficient documentation

## 2012-07-04 LAB — COMPREHENSIVE METABOLIC PANEL
ALT: 42 U/L (ref 0–53)
Albumin: 3.6 g/dL (ref 3.5–5.2)
Calcium: 8.2 mg/dL — ABNORMAL LOW (ref 8.4–10.5)
GFR calc Af Amer: 83 mL/min — ABNORMAL LOW (ref >90)
Glucose, Bld: 227 mg/dL — ABNORMAL HIGH (ref 70–99)
Sodium: 135 mEq/L (ref 135–145)
Total Protein: 6.5 g/dL (ref 6.0–8.3)

## 2012-07-04 LAB — CBC WITH DIFFERENTIAL/PLATELET
Basophils Relative: 0 % (ref 0–1)
Eosinophils Absolute: 0.1 10*3/uL (ref 0.0–0.7)
Eosinophils Relative: 1 % (ref 0–5)
Lymphs Abs: 0.3 10*3/uL — ABNORMAL LOW (ref 0.7–4.0)
MCH: 30.7 pg (ref 26.0–34.0)
MCHC: 34.4 g/dL (ref 30.0–36.0)
MCV: 89 fL (ref 78.0–100.0)
Neutrophils Relative %: 85 % — ABNORMAL HIGH (ref 43–77)
Platelets: 94 10*3/uL — ABNORMAL LOW (ref 150–400)
RBC: 4.73 MIL/uL (ref 4.22–5.81)
RDW: 13.8 % (ref 11.5–15.5)

## 2012-07-04 LAB — INFLUENZA PANEL BY PCR (TYPE A & B)
H1N1 flu by pcr: DETECTED — AB
Influenza A By PCR: POSITIVE — AB
Influenza B By PCR: NEGATIVE

## 2012-07-04 LAB — PROTIME-INR: Prothrombin Time: 13.9 seconds (ref 11.6–15.2)

## 2012-07-04 MED ORDER — DEXTROSE 5 % IV SOLN
1.0000 g | Freq: Once | INTRAVENOUS | Status: AC
  Administered 2012-07-04: 1 g via INTRAVENOUS
  Filled 2012-07-04: qty 10

## 2012-07-04 MED ORDER — ALBUTEROL SULFATE HFA 108 (90 BASE) MCG/ACT IN AERS: 1.0000 | INHALATION_SPRAY | Freq: Four times a day (QID) | RESPIRATORY_TRACT | Status: DC | PRN

## 2012-07-04 MED ORDER — ALBUTEROL SULFATE (5 MG/ML) 0.5% IN NEBU
INHALATION_SOLUTION | RESPIRATORY_TRACT | Status: AC
  Filled 2012-07-04: qty 1

## 2012-07-04 MED ORDER — HYDROCOD POLST-CHLORPHEN POLST 10-8 MG/5ML PO LQCR
5.0000 mL | Freq: Once | ORAL | Status: AC
  Administered 2012-07-04: 5 mL via ORAL
  Filled 2012-07-04: qty 5

## 2012-07-04 MED ORDER — ALBUTEROL SULFATE (5 MG/ML) 0.5% IN NEBU
5.0000 mg | INHALATION_SOLUTION | Freq: Once | RESPIRATORY_TRACT | Status: AC
  Administered 2012-07-04: 5 mg via RESPIRATORY_TRACT
  Filled 2012-07-04: qty 1

## 2012-07-04 MED ORDER — OSELTAMIVIR PHOSPHATE 75 MG PO CAPS: 75.0000 mg | ORAL_CAPSULE | Freq: Two times a day (BID) | ORAL | Status: DC

## 2012-07-04 MED ORDER — IPRATROPIUM BROMIDE 0.02 % IN SOLN
0.5000 mg | Freq: Once | RESPIRATORY_TRACT | Status: AC
  Administered 2012-07-04: 0.5 mg via RESPIRATORY_TRACT

## 2012-07-04 MED ORDER — PREDNISONE 10 MG PO TABS: 20.0000 mg | ORAL_TABLET | Freq: Every day | ORAL | Status: DC

## 2012-07-04 MED ORDER — ACETAMINOPHEN 325 MG PO TABS
650.0000 mg | ORAL_TABLET | Freq: Once | ORAL | Status: AC
  Administered 2012-07-04: 650 mg via ORAL

## 2012-07-04 MED ORDER — ALBUTEROL SULFATE (5 MG/ML) 0.5% IN NEBU
5.0000 mg | INHALATION_SOLUTION | Freq: Once | RESPIRATORY_TRACT | Status: AC
  Administered 2012-07-04: 5 mg via RESPIRATORY_TRACT

## 2012-07-04 MED ORDER — AZITHROMYCIN 250 MG PO TABS: ORAL_TABLET | ORAL | Status: DC

## 2012-07-04 MED ORDER — OSELTAMIVIR PHOSPHATE 75 MG PO CAPS
75.0000 mg | ORAL_CAPSULE | Freq: Once | ORAL | Status: DC
  Administered 2012-07-04: 75 mg via ORAL
  Filled 2012-07-04: qty 1

## 2012-07-04 MED ORDER — SODIUM CHLORIDE 0.9 % IV BOLUS (SEPSIS)
500.0000 mL | Freq: Once | INTRAVENOUS | Status: AC
  Administered 2012-07-04: 500 mL via INTRAVENOUS

## 2012-07-04 MED ORDER — IPRATROPIUM BROMIDE 0.02 % IN SOLN
0.5000 mg | Freq: Once | RESPIRATORY_TRACT | Status: AC
  Administered 2012-07-04: 0.5 mg via RESPIRATORY_TRACT
  Filled 2012-07-04: qty 2.5

## 2012-07-04 MED ORDER — METHYLPREDNISOLONE SODIUM SUCC 125 MG IJ SOLR
125.0000 mg | Freq: Once | INTRAMUSCULAR | Status: AC
  Administered 2012-07-04: 125 mg via INTRAVENOUS
  Filled 2012-07-04: qty 2

## 2012-07-04 MED ORDER — DEXTROSE 5 % IV SOLN
500.0000 mg | Freq: Once | INTRAVENOUS | Status: AC
  Administered 2012-07-04: 500 mg via INTRAVENOUS
  Filled 2012-07-04: qty 500

## 2012-07-04 MED ORDER — ACETAMINOPHEN 325 MG PO TABS
ORAL_TABLET | ORAL | Status: AC
  Administered 2012-07-04: 650 mg via ORAL
  Filled 2012-07-04: qty 2

## 2012-07-04 MED ORDER — SODIUM CHLORIDE 0.9 % IV SOLN
Freq: Once | INTRAVENOUS | Status: AC
  Administered 2012-07-04: 08:00:00 via INTRAVENOUS

## 2012-07-04 MED ORDER — IPRATROPIUM BROMIDE 0.02 % IN SOLN
RESPIRATORY_TRACT | Status: AC
  Filled 2012-07-04: qty 2.5

## 2012-07-04 MED ORDER — ALBUTEROL SULFATE HFA 108 (90 BASE) MCG/ACT IN AERS
2.0000 | INHALATION_SPRAY | Freq: Once | RESPIRATORY_TRACT | Status: AC
  Administered 2012-07-04: 2 via RESPIRATORY_TRACT
  Filled 2012-07-04: qty 6.7

## 2012-07-04 NOTE — ED Provider Notes (Addendum)
History     CSN: [id number]  Arrival date & time 07/04/12  0508   First MD Initiated Contact with Patient 07/04/12 [phone]      Chief Complaint  Patient presents with  . Shortness of Breath  . Fever    (Consider location/radiation/quality/duration/timing/severity/associated sxs/prior treatment) HPI... cough, wheezing, fever, dyspnea for 24 hours. Nothing makes symptoms better or worse. Severity is moderate. No chest pain. No other associated symptoms. Has taken over-the-counter products at home  Past Medical History  Diagnosis Date  . Asthma   . Kidney stones     Past Surgical History  Procedure Date  . Replacement total knee   . Hernia repair     History reviewed. No pertinent family history.  History  Substance Use Topics  . Smoking status: Never Smoker   . Smokeless tobacco: Not on file  . Alcohol Use: No      Review of Systems  All other systems reviewed and are negative.    Allergies  Aspirin and Penicillins  Home Medications  No current outpatient prescriptions on file.  BP 152/76  Pulse 106  Temp 99 F (37.2 C) (Oral)  Resp 24  Ht 6' (1.829 m)  Wt 218 lb (98.884 kg)  BMI 29.57 kg/m2  SpO2 93%  Physical Exam  Nursing note and vitals reviewed. Constitutional: He is oriented to person, place, and time. He appears well-developed and well-nourished.  HENT:  Head: Normocephalic and atraumatic.  Eyes: Conjunctivae normal and EOM are normal. Pupils are equal, round, and reactive to light.  Neck: Normal range of motion. Neck supple.  Cardiovascular: Normal rate, regular rhythm and normal heart sounds.   Pulmonary/Chest: Effort normal.       Bilateral expiratory wheeze  Abdominal: Soft. Bowel sounds are normal.  Musculoskeletal: Normal range of motion.  Neurological: He is alert and oriented to person, place, and time.  Skin: Skin is warm and dry.  Psychiatric: He has a normal mood and affect.    ED Course  Procedures (including critical care  time)  Labs Reviewed - No data to display No results found.   No diagnosis found.  Dg Chest 2 View  07/04/2012  *RADIOLOGY REPORT*  Clinical Data: Shortness of breath, cough, congestion, fever  CHEST - 2 VIEW  Comparison: 01/24/2010  Findings: Grossly unchanged cardiac silhouette and mediastinal contours given slightly reduced lung volumes.  Possible developing heterogeneous air space opacity within the right mid lung.  No definite pleural effusion or pneumothorax.  Unchanged bones.  IMPRESSION: Possible developing air space opacity within right mid lung worrisome for infection. A follow-up chest radiograph in 4 to 6 weeks after treatment is recommended to ensure resolution.   Original Report Authenticated By: Jake Seats, MD    Date: 07/04/2012  Rate: 102  Rhythm: sinus tachycardia  QRS Axis: normal  Intervals: normal  ST/T Wave abnormalities: normal  Conduction Disutrbances:none  Narrative Interpretation:   Old EKG Reviewed: none available   MDM  Albuterol/Atrovent breathing treatment, cough syrup, chest x-ray.  0730.... chest x-ray reveals possible early right mid lung pneumonia.  Rx IV Rocephin 1 g IV and Zithromax 500 mg IV.   Discharge home on Zithromax by mouth, albuterol inhaler.      Nat Christen, MD 07/04/12 3790  Nat Christen, MD 07/04/12 [place], MD 07/04/12 [phone]

## 2012-07-04 NOTE — ED Notes (Signed)
MD aware of Tamiflu administration prior to discontinuation of order.

## 2012-07-04 NOTE — ED Notes (Signed)
Patient complaining of headache, cough, fever, and shortness of breath since yesterday.

## 2012-07-04 NOTE — ED Provider Notes (Signed)
Patient here with influenza like illness. Signed out by Dr. Lacinda Axon receiving iv fluids.  Patient had elevated fever after tylenol and felt worse.  Labs obtained. Results for orders placed during the hospital encounter of 07/04/12  CBC WITH DIFFERENTIAL      Component Value Range   WBC 5.4  4.0 - 10.5 K/uL   RBC 4.73  4.22 - 5.81 MIL/uL   Hemoglobin 14.5  13.0 - 17.0 g/dL   HCT 42.1  39.0 - 52.0 %   MCV 89.0  78.0 - 100.0 fL   MCH 30.7  26.0 - 34.0 pg   MCHC 34.4  30.0 - 36.0 g/dL   RDW 13.8  11.5 - 15.5 %   Platelets 94 (*) 150 - 400 K/uL   Neutrophils Relative 85 (*) 43 - 77 %   Neutro Abs 4.5  1.7 - 7.7 K/uL   Lymphocytes Relative 6 (*) 12 - 46 %   Lymphs Abs 0.3 (*) 0.7 - 4.0 K/uL   Monocytes Relative 8  3 - 12 %   Monocytes Absolute 0.4  0.1 - 1.0 K/uL   Eosinophils Relative 1  0 - 5 %   Eosinophils Absolute 0.1  0.0 - 0.7 K/uL   Basophils Relative 0  0 - 1 %   Basophils Absolute 0.0  0.0 - 0.1 K/uL  COMPREHENSIVE METABOLIC PANEL      Component Value Range   Sodium 135  135 - 145 mEq/L   Potassium 3.4 (*) 3.5 - 5.1 mEq/L   Chloride 98  96 - 112 mEq/L   CO2 25  19 - 32 mEq/L   Glucose, Bld 227 (*) 70 - 99 mg/dL   BUN 11  6 - 23 mg/dL   Creatinine, Ser 1.11  0.50 - 1.35 mg/dL   Calcium 8.2 (*) 8.4 - 10.5 mg/dL   Total Protein 6.5  6.0 - 8.3 g/dL   Albumin 3.6  3.5 - 5.2 g/dL   AST 32  0 - 37 U/L   ALT 42  0 - 53 U/L   Alkaline Phosphatase 129 (*) 39 - 117 U/L   Total Bilirubin 1.1  0.3 - 1.2 mg/dL   GFR calc non Af Amer 71 (*) >90 mL/min   GFR calc Af Amer 83 (*) >90 mL/min  PROTIME-INR      Component Value Range   Prothrombin Time 13.9  11.6 - 15.2 seconds   INR 1.08  0.00 - 1.49   NOw temp down and patient feels improved, will add tamiflu and prednisone to z-pack and albuterol.    Shaune Pollack, MD 07/04/12 [phone]

## 2012-07-04 NOTE — ED Notes (Addendum)
Patient states he is feeling somewhat better, states his cough is improved.  While patient is sleeping, his O2 sats decrease to 89%, placed on 2 lpm O2 via nasal canula.

## 2012-07-16 ENCOUNTER — Other Ambulatory Visit: Payer: Self-pay | Admitting: Dermatology

## 2012-07-29 ENCOUNTER — Ambulatory Visit: Payer: Self-pay | Admitting: Family Medicine

## 2012-08-01 ENCOUNTER — Encounter (INDEPENDENT_AMBULATORY_CARE_PROVIDER_SITE_OTHER): Payer: Self-pay | Admitting: General Surgery

## 2012-08-01 ENCOUNTER — Ambulatory Visit (INDEPENDENT_AMBULATORY_CARE_PROVIDER_SITE_OTHER): Payer: Medicare Other | Admitting: General Surgery

## 2012-08-01 VITALS — BP 123/67 | HR 74 | Temp 98.9°F | Resp 16 | Ht 72.0 in | Wt 216.8 lb

## 2012-08-01 DIAGNOSIS — K8020 Calculus of gallbladder without cholecystitis without obstruction: Secondary | ICD-10-CM | POA: Insufficient documentation

## 2012-08-01 DIAGNOSIS — K766 Portal hypertension: Secondary | ICD-10-CM | POA: Insufficient documentation

## 2012-08-01 DIAGNOSIS — K4020 Bilateral inguinal hernia, without obstruction or gangrene, not specified as recurrent: Secondary | ICD-10-CM

## 2012-08-01 DIAGNOSIS — R161 Splenomegaly, not elsewhere classified: Secondary | ICD-10-CM | POA: Insufficient documentation

## 2012-08-01 DIAGNOSIS — I8500 Esophageal varices without bleeding: Secondary | ICD-10-CM | POA: Insufficient documentation

## 2012-08-01 NOTE — Progress Notes (Signed)
Patient ID: Stephen Burch, male   DOB: 1956-06-07, 57 y.o.   MRN: [id number]  Chief Complaint  Patient presents with  . Hernia    HPI Stephen Burch is a 57 y.o. male.  He is referred by Dr. Wendall Mola, [facility] , for evaluation of bilateral inguinal hernias as seen on CT scan.  The patient had bilateral inguinal hernia repairs 39 years ago by Dr. Jerelene Redden. Mesh was not used. In 1999 he underwent repair of recurrent left ventral hernia with mesh and repair left varicocele by Dr. Assunta Gambles in [place].(we have the op note)  He now presents with a one-year history of dysuria which actually has resolved. He denies groin pain. Denies history of alcohol abuse. Denies history of hepatitis. Urologic evaluation was not remarkable. CT scan was performed which shows a bilateral inguinal hernias, right a little bit larger than left, and the bladder appears to be imaging up into the right inguinal hernia. Subtle cirrhosis was suspected. Portal hypertension with venous collaterals was documented. Marked splenomegaly was noted. No ascites. . There was one tiny gallstone. There was a left renal calculus.  He states that he is going to get a cystoscopy Monday. HPI  Past Medical History  Diagnosis Date  . Asthma   . Kidney stones     Past Surgical History  Procedure Date  . Replacement total knee   . Hernia repair     History reviewed. No pertinent family history.  Social History History  Substance Use Topics  . Smoking status: Never Smoker   . Smokeless tobacco: Not on file  . Alcohol Use: No    Allergies  Allergen Reactions  . Aspirin Shortness Of Breath  . Penicillins Shortness Of Breath    Current Outpatient Prescriptions  Medication Sig Dispense Refill  . acetaminophen (TYLENOL) 500 MG tablet Take 500 mg by mouth every 6 (six) hours as needed. Pain      . albuterol (PROVENTIL HFA;VENTOLIN HFA) 108 (90 BASE) MCG/ACT inhaler Inhale 1-2 puffs into the lungs every  6 (six) hours as needed for wheezing.  1 Inhaler  0  . azithromycin (ZITHROMAX Z-PAK) 250 MG tablet 2 po day one, then 1 daily x 4 days  5 tablet  0  . oseltamivir (TAMIFLU) 75 MG capsule Take 1 capsule (75 mg total) by mouth every 12 (twelve) hours.  10 capsule  0  . predniSONE (DELTASONE) 10 MG tablet Take 2 tablets (20 mg total) by mouth daily.  15 tablet  0    Review of Systems Review of Systems  Constitutional: Negative for fever, chills and unexpected weight change.       Denies history of alcohol abuse ever. Denies history of hepatitis.  HENT: Negative for hearing loss, congestion, sore throat, trouble swallowing and voice change.   Eyes: Negative for visual disturbance.  Respiratory: Negative for cough and wheezing.   Cardiovascular: Negative for chest pain, palpitations and leg swelling.  Gastrointestinal: Negative for nausea, vomiting, abdominal pain, diarrhea, constipation, blood in stool, abdominal distention, anal bleeding and rectal pain.  Genitourinary: Positive for dysuria. Negative for hematuria and difficulty urinating.       Recent dysuria, now resolved.  Musculoskeletal: Negative for arthralgias.  Skin: Negative for rash and wound.  Neurological: Negative for seizures, syncope, weakness and headaches.  Hematological: Negative for adenopathy. Does not bruise/bleed easily.  Psychiatric/Behavioral: Negative for confusion.    Blood pressure 123/67, pulse 74, temperature 98.9 F (37.2 C), temperature source Temporal, resp.  rate 16, height 6' (1.829 m), weight 216 lb 12.8 oz (98.34 kg).  Physical Exam Physical Exam  Constitutional: He is oriented to person, place, and time. He appears well-developed and well-nourished. No distress.  HENT:  Head: Normocephalic.  Nose: Nose normal.  Mouth/Throat: No oropharyngeal exudate.  Eyes: Conjunctivae normal and EOM are normal. Pupils are equal, round, and reactive to light. Right eye exhibits no discharge. Left eye exhibits no  discharge. No scleral icterus.  Neck: Normal range of motion. Neck supple. No JVD present. No tracheal deviation present. No thyromegaly present.  Cardiovascular: Normal rate, regular rhythm, normal heart sounds and intact distal pulses.   No murmur heard. Pulmonary/Chest: Effort normal and breath sounds normal. No stridor. No respiratory distress. He has no wheezes. He has no rales. He exhibits no tenderness.  Abdominal: Soft. Bowel sounds are normal. He exhibits no distension and no mass. There is no tenderness. There is no rebound and no guarding.       Tiny umbilical hernia. I do not detect organomegaly on exam.  Genitourinary:       I have examined him supine and standing, and I do not find any dramatic evidence of inguinal hernia. inguinal area is nontender.Very large varicocele in the scrotum. Testes descended.  Musculoskeletal: Normal range of motion. He exhibits no edema and no tenderness.  Lymphadenopathy:    He has no cervical adenopathy.  Neurological: He is alert and oriented to person, place, and time. He has normal reflexes. Coordination normal.  Skin: Skin is warm and dry. No rash noted. He is not diaphoretic. No erythema. No pallor.  Psychiatric: He has a normal mood and affect. His behavior is normal. Judgment and thought content normal.    Data Reviewed CT scan reviewed. Office notes from Dr. Frann Rider reviewed. Operative note from Dr. Achilles Dunk 1999 reviewed.  Assessment    CT diagnosis of bilateral inguinal hernias. This does not correlate with symptoms or physical exam. Suspect he does have recurrent inguinal hernias but they are not symptomatic and they do not require immediate operative intervention.  New finding of probable cirrhosis, portal hypertension, and marked splenomegaly. There are multiple potential etiologies for this, and this needs to be evaluated as a priority. I explained this to him in detail discussing some of the differential diagnosis.  Continue urologic  workup including cystoscopy with Dr. Frann Rider  Return to see me as necessary.    Plan    Continue urologic workup including cystoscopy cystoscopy with Dr. Frann Rider  He is referred back to his primary care physician, Dr. Mila Merry in [place], to begin the evaluation of his marked splenomegaly, portal hypertension and possible cirrhosis.  Return to see me as necessary.       Angelia Mould. Derrell Lolling, M.D., [facility] General and Minimally invasive Surgery Breast and Colorectal Surgery Office:   [phone] Pager:   [phone]  08/01/2012, 4:01 PM

## 2012-08-01 NOTE — Patient Instructions (Signed)
Your CT scan suggested bilateral, recurrent inguinal hernias. Your physical exam is unremarkable, meaning I do not really feel an obvious hernia on either side. The absence of pain in your  groins also suggest that there is no significant hernia.  You might require a hernia operation in the future.  The most significant finding on your CT scan is significant enlargement of the spleen, portal hypertension, and mild cirrhosis of the liver. These problems need to be explained at this time. They are the most important findings. Your hernias are not as important at this time, although they might become important in the future  You are  requested to see your primary care physician in [place], Dr. [name], to begin the evaluation of your portal hypertension and splenomegaly and possible cirrhosis.

## 2012-08-08 ENCOUNTER — Ambulatory Visit: Payer: Self-pay | Admitting: Family Medicine

## 2012-08-12 ENCOUNTER — Encounter (INDEPENDENT_AMBULATORY_CARE_PROVIDER_SITE_OTHER): Payer: Self-pay

## 2012-09-02 ENCOUNTER — Encounter (INDEPENDENT_AMBULATORY_CARE_PROVIDER_SITE_OTHER): Payer: Self-pay | Admitting: *Deleted

## 2012-09-02 ENCOUNTER — Other Ambulatory Visit (INDEPENDENT_AMBULATORY_CARE_PROVIDER_SITE_OTHER): Payer: Self-pay | Admitting: *Deleted

## 2012-09-02 ENCOUNTER — Ambulatory Visit (INDEPENDENT_AMBULATORY_CARE_PROVIDER_SITE_OTHER): Payer: Medicare Other | Admitting: Internal Medicine

## 2012-09-02 ENCOUNTER — Encounter (INDEPENDENT_AMBULATORY_CARE_PROVIDER_SITE_OTHER): Payer: Self-pay | Admitting: Internal Medicine

## 2012-09-02 VITALS — BP 122/78 | HR 60 | Temp 97.8°F | Ht 72.0 in | Wt 212.6 lb

## 2012-09-02 DIAGNOSIS — R161 Splenomegaly, not elsewhere classified: Secondary | ICD-10-CM

## 2012-09-02 DIAGNOSIS — K7460 Unspecified cirrhosis of liver: Secondary | ICD-10-CM

## 2012-09-02 NOTE — Patient Instructions (Addendum)
Labs. EGD with Dr. Laural Golden.

## 2012-09-02 NOTE — Progress Notes (Signed)
Subjective:     Patient ID: Stephen Burch, male   DOB: [birth year], 57 y.o.   MRN: [id number]  HPIReferred to our office by Dr. Caryn Section for cirrhosis. CT scan 06/27/2013 (rt lower quadrant pain): IMPRESSION:  1. Bilateral inguinal hernias. The right inguinal hernia contains  part of the bladder which likely explains the patient's symptoms.  2. Left-sided varicocele.  3. Cirrhosis with portal hypertension, portal venous collaterals  and fairly marked splenomegaly. No ascites.  4. Left renal calculus.  5. Cholelithiasis. No hx of etoh abuse. One tattoo. No IV drug use. Appetite is good. No weight loss. BMs are normal. Family hx of non-alcoholic fatty liver disease (father) Father deceased from an esophageal varices bleed in his 56s.  Seen at [facility] and transferred to [facility] where he ultimately died. Labs: Hep A AB, IgM anegative, HBsAg screen negative, Hep B core Ab, igM negative, Hep C virus Ab negative. Total bili 1.2, Direct 0.33, ALP 169, AST 35, ALT 48.  Review of Systems see hpi Current Outpatient Prescriptions  Medication Sig Dispense Refill  . acetaminophen (TYLENOL) 500 MG tablet Take 500 mg by mouth every 6 (six) hours as needed. Pain      . milk thistle 175 MG tablet Take 175 mg by mouth daily.      . multivitamin-iron-minerals-folic acid (CENTRUM) chewable tablet Chew 1 tablet by mouth daily.      . tamsulosin (FLOMAX) 0.4 MG CAPS Take 0.4 mg by mouth.       No current facility-administered medications for this visit.   Past Medical History  Diagnosis Date  . Asthma   . Kidney stones    Past Surgical History  Procedure Laterality Date  . Replacement total knee      Bilateral knee replacement, 2010, 2012  . Hernia repair     Allergies  Allergen Reactions  . Aspirin Shortness Of Breath  . Penicillins Shortness Of Breath        Objective:   Physical Exam  Filed Vitals:   09/02/12 1123  BP: 122/78  Pulse: 60  Temp: 97.8 F (36.6 C)  Height: 6' (1.829 m)   Weight: 212 lb 9.6 oz (96.435 kg)   Alert and oriented. Skin warm and dry. Oral mucosa is moist.   . Sclera anicteric, conjunctivae is pink. Thyroid not enlarged. No cervical lymphadenopathy. Lungs clear. Heart regular rate and rhythm.  Abdomen is soft. Bowel sounds are positive. No hepatomegaly. No abdominal masses felt. No tenderness.  No edema to lower extremities.  (I could not feel the spleen)     Assessment:   Cirrhosis without mention of etoh. Splenomegaly. I discussed this case with Dr. Laural Golden.     Plan:     AFP, PT/INR, ANA, SMA, Alpha1antitrypsyn, sedrate, ferritin, Iron EGD to rule out esophageal varices. [facility] and [facility] A and B vaccine. Will need a liver biopsy in the near future.

## 2012-09-03 ENCOUNTER — Telehealth (INDEPENDENT_AMBULATORY_CARE_PROVIDER_SITE_OTHER): Payer: Self-pay | Admitting: *Deleted

## 2012-09-03 NOTE — Telephone Encounter (Signed)
Denim said Stephen Castle, NP was going to put her a low grade BP medication and the pharmacy has not received it. He wanted to check on this if Karna Christmas could please return his call to [phone].

## 2012-09-03 NOTE — Telephone Encounter (Signed)
Message left at home

## 2012-09-09 ENCOUNTER — Encounter (HOSPITAL_COMMUNITY): Payer: Self-pay | Admitting: Pharmacy Technician

## 2012-09-12 ENCOUNTER — Other Ambulatory Visit (INDEPENDENT_AMBULATORY_CARE_PROVIDER_SITE_OTHER): Payer: Self-pay | Admitting: Internal Medicine

## 2012-09-12 LAB — HEPATIC FUNCTION PANEL
ALT: 37 U/L (ref 0–53)
AST: 33 U/L (ref 0–37)
Albumin: 4.7 g/dL (ref 3.5–5.2)
Bilirubin, Direct: 0.3 mg/dL (ref 0.0–0.3)
Total Protein: 7.2 g/dL (ref 6.0–8.3)

## 2012-09-12 LAB — PROTIME-INR: Prothrombin Time: 13.2 seconds (ref 11.6–15.2)

## 2012-09-13 LAB — ALPHA-1-ANTITRYPSIN: A-1 Antitrypsin, Ser: 111 mg/dL (ref 90–200)

## 2012-09-13 LAB — AFP TUMOR MARKER: AFP-Tumor Marker: <1.3 ng/mL (ref 0.0–8.0)

## 2012-09-15 LAB — ANA: Anti Nuclear Antibody(ANA): NEGATIVE

## 2012-09-17 ENCOUNTER — Encounter (HOSPITAL_COMMUNITY)
Admission: RE | Disposition: A | Discharge status: Home / Self Care | Payer: Self-pay | Source: Ambulatory Visit | Attending: Internal Medicine

## 2012-09-17 ENCOUNTER — Encounter (HOSPITAL_COMMUNITY): Payer: Self-pay | Admitting: *Deleted

## 2012-09-17 ENCOUNTER — Ambulatory Visit (HOSPITAL_COMMUNITY)
Admission: RE | Admit: 2012-09-17 | Discharge: 2012-09-17 | Disposition: A | Discharge status: Home / Self Care | Payer: Medicare Other | Source: Ambulatory Visit | Attending: Internal Medicine | Admitting: Internal Medicine

## 2012-09-17 DIAGNOSIS — K766 Portal hypertension: Secondary | ICD-10-CM

## 2012-09-17 DIAGNOSIS — K2270 Barrett's esophagus without dysplasia: Secondary | ICD-10-CM | POA: Insufficient documentation

## 2012-09-17 DIAGNOSIS — I8500 Esophageal varices without bleeding: Secondary | ICD-10-CM

## 2012-09-17 DIAGNOSIS — K7460 Unspecified cirrhosis of liver: Secondary | ICD-10-CM | POA: Insufficient documentation

## 2012-09-17 HISTORY — PX: ESOPHAGOGASTRODUODENOSCOPY: SHX5428

## 2012-09-17 SURGERY — EGD (ESOPHAGOGASTRODUODENOSCOPY)
Anesthesia: Moderate Sedation

## 2012-09-17 MED ORDER — SODIUM CHLORIDE 0.45 % IV SOLN: INTRAVENOUS | Status: DC

## 2012-09-17 MED ORDER — BUTAMBEN-TETRACAINE-BENZOCAINE 2-2-14 % EX AERO
INHALATION_SPRAY | CUTANEOUS | Status: DC | PRN
  Administered 2012-09-17: 2 via TOPICAL

## 2012-09-17 MED ORDER — MEPERIDINE HCL 25 MG/ML IJ SOLN
INTRAMUSCULAR | Status: DC | PRN
  Administered 2012-09-17 (×2): 25 mg via INTRAVENOUS

## 2012-09-17 MED ORDER — PANTOPRAZOLE SODIUM 40 MG PO TBEC: 40.0000 mg | DELAYED_RELEASE_TABLET | Freq: Every day | ORAL | Status: DC

## 2012-09-17 MED ORDER — MIDAZOLAM HCL 5 MG/5ML IJ SOLN
INTRAMUSCULAR | Status: DC | PRN
  Administered 2012-09-17: 3 mg via INTRAVENOUS
  Administered 2012-09-17: 2 mg via INTRAVENOUS

## 2012-09-17 MED ORDER — MEPERIDINE HCL 50 MG/ML IJ SOLN
INTRAMUSCULAR | Status: AC
  Filled 2012-09-17: qty 1

## 2012-09-17 MED ORDER — SODIUM CHLORIDE 0.9 % IV SOLN
INTRAVENOUS | Status: DC
  Administered 2012-09-17: 13:00:00 via INTRAVENOUS

## 2012-09-17 MED ORDER — STERILE WATER FOR IRRIGATION IR SOLN
Status: DC | PRN
  Administered 2012-09-17: 14:00:00

## 2012-09-17 MED ORDER — MIDAZOLAM HCL 5 MG/5ML IJ SOLN
INTRAMUSCULAR | Status: AC
  Filled 2012-09-17: qty 10

## 2012-09-17 NOTE — Op Note (Signed)
EGD PROCEDURE REPORT  PATIENT:  Stephen Burch  MR#:  [id number] Birthdate:  07-25-1955, 57 y.o., male Endoscopist:  Dr. Rogene Houston, MD Referred By:  Dr. Carlyn Reichert, MD Procedure Date: 09/17/2012  Procedure:   EGD  Indications:  Patient is 57 year old Caucasian male who was recently found to have cirrhosis. Biochemical markers for hep B, hep C autoimmune hepatitis hemachromatosis alpha-1 antitrypsin deficiency are all negative. CT revealed splenomegaly and portal venous collaterals. He is therefore undergoing EGD looking for varices. His last EGD was in January 2000 and with esophageal dilation when he presented with dysphagia secondary to distal esophageal ring. He also underwent colonoscopy at that time with removal of 6 mm tubular adenoma.           Informed Consent:  The risks, benefits, alternatives & imponderables which include, but are not limited to, bleeding, infection, perforation, drug reaction and potential missed lesion have been reviewed.  The potential for biopsy, lesion removal, esophageal dilation, etc. have also been discussed.  Questions have been answered.  All parties agreeable.  Please see history & physical in medical record for more information.  Medications:  Demerol 50 mg IV Versed 5 mg IV Cetacaine spray topically for oropharyngeal anesthesia  Description of procedure:  The endoscope was introduced through the mouth and advanced to the second portion of the duodenum without difficulty or limitations. The mucosal surfaces were surveyed very carefully during advancement of the scope and upon withdrawal.  Findings:  Esophagus:  Mucosa of the proximal and middle third was normal. Distally there was a single short columns of esophageal varix at 12 o'clock. 2 small salmon-colored patches noted measuring no more than 10 mm in length. Biopsy taken for routine histology. GEJ:  39 cm Stomach:  Stomach was empty and distended very well with insufflation. Folds in  the proximal stomach are normal. Examination mucosa at body revealed mosaic pattern. No telangiectasia erosions or ulcers are noted. Antral mucosa was normal. Pyloric channel was patent. Annularis fundus and cardia were examined by retroflexing the scope and were normal. No fundal varices are identified. Duodenum:  Normal bulbar and post bulbar mucosa.  Therapeutic/Diagnostic Maneuvers Performed:  See above  Complications:  None  Impression: Single short column of grade 1 esophageal varix. Two small patches of salmon-colored mucosa suspicious for short segment Barrett's. Biopsy taken. Mild portal gastropathy.  Recommendations:  Pantoprazole 40 mg by mouth every morning. I will be calling patient with results of biopsy. Liver biopsy to be arranged in near future.  Tod Abrahamsen U  09/17/2012  2:42 PM  CC: Dr. Carlyn Reichert, MD & Dr. Rayne Du ref. provider found

## 2012-09-17 NOTE — H&P (Signed)
Stephen Burch is an 57 y.o. male.   Chief Complaint: Patient is here for EGD. HPI: Patient is 57 year old Caucasian male who was recently found to have cirrhosis based on abdominopelvic CT that he had for urologic indication. His workup has been negative except he has not had ceruloplasmin. Family history significant for a cirrhosis in the father was diagnosed at age 62 and died of variceal bleed about 5 years ago. Asian has good appetite. He denies abdominal pain distention or lower extremity edema. Since he immigrated this diagnosis is change his eating habits and cut down salt intake.  Past Medical History  Diagnosis Date  . Asthma   . Kidney stones   . Cirrhosis     Past Surgical History  Procedure Laterality Date  . Replacement total knee      Bilateral knee replacement, 2010, 2012  . Hernia repair      Family History  Problem Relation Age of Onset  . Colon cancer Neg Hx   . Colon polyps Neg Hx    Social History:  reports that he has quit smoking. His smoking use included Cigarettes. He smoked 0.00 packs per day for 3 years. He does not have any smokeless tobacco history on file. He reports that he does not drink alcohol or use illicit drugs.  Allergies:  Allergies  Allergen Reactions  . Aspirin Shortness Of Breath  . Penicillins Shortness Of Breath    Medications Prior to Admission  Medication Sig Dispense Refill  . albuterol (PROVENTIL HFA;VENTOLIN HFA) 108 (90 BASE) MCG/ACT inhaler Inhale 2 puffs into the lungs every 6 (six) hours as needed for wheezing or shortness of breath.      . milk thistle 175 MG tablet Take 175 mg by mouth daily.      . multivitamin-iron-minerals-folic acid (CENTRUM) chewable tablet Chew 1 tablet by mouth daily.      . tamsulosin (FLOMAX) 0.4 MG CAPS Take 0.4 mg by mouth.        No results found for this or any previous visit (from the past 48 hour(s)). No results found.  ROS  Blood pressure 124/86, pulse 55, temperature 98.3 F (36.8  C), temperature source Oral, resp. rate 13, height 6' (1.829 m), weight 208 lb (94.348 kg), SpO2 99.00%. Physical Exam  Constitutional: He appears well-developed and well-nourished.  HENT:  Mouth/Throat: Oropharynx is clear and moist.  Eyes: Conjunctivae are normal. No scleral icterus.  Neck: No thyromegaly present.  Cardiovascular: Normal rate, regular rhythm and normal heart sounds.   No murmur heard. GI: Soft. He exhibits no distension and no mass. There is no tenderness.  Palpable spleen  Musculoskeletal: He exhibits no edema.  Lymphadenopathy:    He has no cervical adenopathy.  Neurological: He is alert.  Skin: Skin is warm and dry.     Assessment/Plan New diagnosis of cirrhosis. EGD looking for varices.  Stephen Burch U 09/17/2012, 2:19 PM

## 2012-09-22 ENCOUNTER — Encounter (INDEPENDENT_AMBULATORY_CARE_PROVIDER_SITE_OTHER): Payer: Self-pay | Admitting: *Deleted

## 2012-09-22 ENCOUNTER — Other Ambulatory Visit (INDEPENDENT_AMBULATORY_CARE_PROVIDER_SITE_OTHER): Payer: Self-pay | Admitting: Internal Medicine

## 2012-09-22 DIAGNOSIS — K7460 Unspecified cirrhosis of liver: Secondary | ICD-10-CM

## 2012-09-23 ENCOUNTER — Encounter (HOSPITAL_COMMUNITY): Payer: Self-pay | Admitting: Internal Medicine

## 2012-09-23 ENCOUNTER — Encounter (INDEPENDENT_AMBULATORY_CARE_PROVIDER_SITE_OTHER): Payer: Self-pay

## 2012-09-24 ENCOUNTER — Encounter (HOSPITAL_COMMUNITY): Payer: Self-pay | Admitting: Pharmacy Technician

## 2012-09-25 ENCOUNTER — Other Ambulatory Visit: Payer: Self-pay | Admitting: Radiology

## 2012-09-30 ENCOUNTER — Ambulatory Visit (HOSPITAL_COMMUNITY)
Admission: RE | Admit: 2012-09-30 | Discharge: 2012-09-30 | Disposition: A | Discharge status: Home / Self Care | Payer: Medicare Other | Source: Ambulatory Visit | Attending: Internal Medicine | Admitting: Internal Medicine

## 2012-09-30 DIAGNOSIS — Z87442 Personal history of urinary calculi: Secondary | ICD-10-CM | POA: Insufficient documentation

## 2012-09-30 DIAGNOSIS — J45909 Unspecified asthma, uncomplicated: Secondary | ICD-10-CM | POA: Insufficient documentation

## 2012-09-30 DIAGNOSIS — K2270 Barrett's esophagus without dysplasia: Secondary | ICD-10-CM | POA: Insufficient documentation

## 2012-09-30 DIAGNOSIS — K7689 Other specified diseases of liver: Secondary | ICD-10-CM | POA: Insufficient documentation

## 2012-09-30 DIAGNOSIS — K7460 Unspecified cirrhosis of liver: Secondary | ICD-10-CM | POA: Insufficient documentation

## 2012-09-30 HISTORY — PX: LIVER BIOPSY: SHX301

## 2012-09-30 LAB — CBC
HCT: 45.7 % (ref 39.0–52.0)
MCV: 84.6 fL (ref 78.0–100.0)
Platelets: 107 10*3/uL — ABNORMAL LOW (ref 150–400)
RBC: 5.4 MIL/uL (ref 4.22–5.81)
WBC: 3.7 10*3/uL — ABNORMAL LOW (ref 4.0–10.5)

## 2012-09-30 MED ORDER — MIDAZOLAM HCL 2 MG/2ML IJ SOLN
INTRAMUSCULAR | Status: AC
  Filled 2012-09-30: qty 4

## 2012-09-30 MED ORDER — FENTANYL CITRATE 0.05 MG/ML IJ SOLN
INTRAMUSCULAR | Status: AC
  Filled 2012-09-30: qty 4

## 2012-09-30 MED ORDER — FENTANYL CITRATE 0.05 MG/ML IJ SOLN
INTRAMUSCULAR | Status: AC | PRN
  Administered 2012-09-30: 50 ug via INTRAVENOUS

## 2012-09-30 MED ORDER — SODIUM CHLORIDE 0.9 % IV SOLN: Freq: Once | INTRAVENOUS | Status: DC

## 2012-09-30 MED ORDER — MIDAZOLAM HCL 2 MG/2ML IJ SOLN
INTRAMUSCULAR | Status: AC | PRN
  Administered 2012-09-30 (×2): 1 mg via INTRAVENOUS

## 2012-09-30 NOTE — Procedures (Signed)
Successful US guided random liver core Bx X 2 No complications

## 2012-09-30 NOTE — H&P (Signed)
Agree 

## 2012-09-30 NOTE — H&P (Signed)
Stephen Burch is an 57 y.o. male.   Chief Complaint:"I'm here to get a liver biopsy" HPI: Patient with history of cirrhosis presents today for US guided random liver biopsy.  Past Medical History  Diagnosis Date  . Asthma   . Kidney stones   . Cirrhosis   Barrett's esophagus  Past Surgical History  Procedure Laterality Date  . Replacement total knee      Bilateral knee replacement, 2010, 2012  . Hernia repair    . Esophagogastroduodenoscopy N/A 09/17/2012    Procedure: ESOPHAGOGASTRODUODENOSCOPY (EGD);  Surgeon: Rogene Houston, MD;  Location: [facility];  Service: Endoscopy;  Laterality: N/A;  200    Family History  Problem Relation Age of Onset  . Colon cancer Neg Hx   . Colon polyps Neg Hx    Social History:  reports that he has quit smoking. His smoking use included Cigarettes. He smoked 0.00 packs per day for 3 years. He does not have any smokeless tobacco history on file. He reports that he does not drink alcohol or use illicit drugs.  Allergies:  Allergies  Allergen Reactions  . Aspirin Shortness Of Breath  . Penicillins Shortness Of Breath    Current outpatient prescriptions:milk thistle 175 MG tablet, Take 175 mg by mouth daily., Disp: , Rfl: ;  multivitamin-iron-minerals-folic acid (CENTRUM) chewable tablet, Chew 1 tablet by mouth daily., Disp: , Rfl: ;  tamsulosin (FLOMAX) 0.4 MG CAPS, Take 0.4 mg by mouth., Disp: , Rfl:  Current facility-administered medications:0.9 %  sodium chloride infusion, , Intravenous, Once, Lavonia Drafts, PA-C   Results for orders placed during the hospital encounter of 09/30/12 (from the past 48 hour(s))  APTT     Status: None   Collection Time    09/30/12  8:49 AM      Result Value Range   aPTT 29  24 - 37 seconds  CBC     Status: Abnormal (Preliminary result)   Collection Time    09/30/12  8:49 AM      Result Value Range   WBC 3.7 (*) 4.0 - 10.5 K/uL   RBC 5.40  4.22 - 5.81 MIL/uL   Hemoglobin 16.7  13.0 - 17.0 g/dL   HCT  45.7  39.0 - 52.0 %   MCV 84.6  78.0 - 100.0 fL   MCH 30.9  26.0 - 34.0 pg   MCHC 36.5 (*) 30.0 - 36.0 g/dL   RDW 12.7  11.5 - 15.5 %   Platelets PENDING  150 - 400 K/uL   No results found.  Review of Systems  Constitutional: Negative for fever and chills.  Respiratory: Negative for cough and shortness of breath.   Cardiovascular: Negative for chest pain.  Gastrointestinal: Negative for nausea, vomiting and abdominal pain.  Musculoskeletal: Negative for back pain.  Neurological: Negative for headaches.  Endo/Heme/Allergies: Does not bruise/bleed easily.    Blood pressure 118/73, pulse 66, temperature 97.5 F (36.4 C), temperature source Oral, resp. rate 20, height 6' (1.829 m), weight 208 lb (94.348 kg), SpO2 98.00%. Physical Exam  Constitutional: He is oriented to person, place, and time. He appears well-developed and well-nourished.  Cardiovascular: Normal rate and regular rhythm.   Respiratory: Effort normal and breath sounds normal.  GI: Soft. Bowel sounds are normal. There is no tenderness.  splenomegaly  Musculoskeletal: Normal range of motion. He exhibits no edema.  Neurological: He is alert and oriented to person, place, and time.     Assessment/Plan Pt with hx cirrhosis.  Plan is for US guided random core liver biopsy today. Details/risks of procedure d/w pt/wife with their understanding and consent.  [name],D KEVIN 09/30/2012, 9:48 AM

## 2012-10-01 ENCOUNTER — Telehealth (HOSPITAL_COMMUNITY): Payer: Self-pay | Admitting: *Deleted

## 2012-10-06 ENCOUNTER — Telehealth (INDEPENDENT_AMBULATORY_CARE_PROVIDER_SITE_OTHER): Payer: Self-pay | Admitting: *Deleted

## 2012-10-06 NOTE — Telephone Encounter (Signed)
Stephen Burch would like to get Dr. Laural Golden option on having his hernia fixed. He was told by Dr. Laural Golden he would speak with a surgeon then get back with him. His return phone number is [phone].

## 2012-10-08 NOTE — Telephone Encounter (Signed)
Spoke to [facility], they will contact pateitn with appt, Stephen Burch is aware

## 2012-10-08 NOTE — Telephone Encounter (Signed)
Ann- Per Dr.Rehman the patient will need a referral to the same Surgeon as he previously saw at the [facility])

## 2012-10-08 NOTE — Telephone Encounter (Signed)
I called the patient to make him aware that his message had been rec'd, Dr.Rehman is out of town and I would ask Dr.Rehman and call him back.Patient's voicemail is not set up. Will try again to reach pateint

## 2012-10-09 ENCOUNTER — Telehealth (INDEPENDENT_AMBULATORY_CARE_PROVIDER_SITE_OTHER): Payer: Self-pay

## 2012-10-09 NOTE — Telephone Encounter (Signed)
I called and relayed the message below to the pt and that Dr Dalbert Batman did not intend to repair the hernias.  I asked him if they get worse and really bother him to let his medical md know and that office can contact us and refer him back.  He agrees.  He says he can feel them but it's not bad.  He only works 2 days a week.

## 2012-10-09 NOTE — Telephone Encounter (Signed)
Message copied by Gweneth Fritter on Thu Oct 09, 2012  2:22 PM ------      Message from: [name]      Created: Wed Oct 08, 2012  5:29 PM       He is high-risk patient due to portal hypertension.            I do not plan surgery to repair his hernias, unless they become symptomatic or unless they become  obvious on physical exam.            According to my last note, I will see him as necessary upon referral by his managing physicians.                  hmi            ----- Message -----         From: Gweneth Fritter, RN         Sent: 10/08/2012   5:02 PM           To: [name], MD              Can you read your last note on this pt?  Do you just need to see him back to check hernia before surgery is scheduled?      ----- Message -----         From: Aviva Signs         Sent: 10/08/2012   4:14 PM           To: Gweneth Fritter, RN            Pt wanting to have hernia sx now.he was here 1/31 last O/V...does he need another f/u apt or can he be sched for sx.pt is awaiting a callback             ------

## 2012-11-17 ENCOUNTER — Ambulatory Visit: Payer: Self-pay | Admitting: Family Medicine

## 2012-12-16 ENCOUNTER — Telehealth (INDEPENDENT_AMBULATORY_CARE_PROVIDER_SITE_OTHER): Payer: Self-pay | Admitting: *Deleted

## 2012-12-16 NOTE — Telephone Encounter (Signed)
No answer at home #

## 2012-12-16 NOTE — Telephone Encounter (Signed)
Would like to get his test results. "It was a new procedure, something like a [place] that was done on Wednesday, 12/10/12." His return phone number is [phone].

## 2012-12-17 NOTE — Telephone Encounter (Signed)
I have spoken with patient. Apparently this was an experimental Xray/?US. Results are not up.

## 2013-02-23 ENCOUNTER — Ambulatory Visit (INDEPENDENT_AMBULATORY_CARE_PROVIDER_SITE_OTHER): Payer: Medicare Other | Admitting: Internal Medicine

## 2013-02-23 ENCOUNTER — Encounter (INDEPENDENT_AMBULATORY_CARE_PROVIDER_SITE_OTHER): Payer: Self-pay | Admitting: Internal Medicine

## 2013-02-23 VITALS — BP 110/70 | HR 74 | Temp 98.7°F | Resp 18 | Ht 72.0 in | Wt 208.9 lb

## 2013-02-23 DIAGNOSIS — R141 Gas pain: Secondary | ICD-10-CM

## 2013-02-23 DIAGNOSIS — R140 Abdominal distension (gaseous): Secondary | ICD-10-CM

## 2013-02-23 DIAGNOSIS — K7460 Unspecified cirrhosis of liver: Secondary | ICD-10-CM

## 2013-02-23 DIAGNOSIS — K219 Gastro-esophageal reflux disease without esophagitis: Secondary | ICD-10-CM

## 2013-02-23 MED ORDER — VITAMIN C 500 MG PO TABS: 500.0000 mg | ORAL_TABLET | Freq: Every day | ORAL | Status: DC

## 2013-02-23 MED ORDER — VITAMIN E 180 MG (400 UNIT) PO CAPS: 400.0000 [IU] | ORAL_CAPSULE | Freq: Every day | ORAL | Status: DC

## 2013-02-23 NOTE — Progress Notes (Signed)
Presenting complaint;  Abdominal bloating.  Subjective:  Patient is a 57 year old Caucasian male who has cirrhosis secondary to NAFLD AS WELL as chronic GERD complicated by short segment Barrett's esophagus who presents with recent onset of bloating. He states bloating started about a month ago. He is not having any pain nausea vomiting diarrhea or constipation. He is wondering if he should have his enlarged spleen removed. He has lost 3 pounds since his last visit of March 2014. He walks 2-3 miles at least 3-4 times each week. He states he had Baker's cyst behind his right knee aspirated by Dr. Reynaldo Minium in May June and August this year. He feels his heartburn is well controlled with therapy. He denies melena or rectal bleeding.  Current Medications: Current Outpatient Prescriptions  Medication Sig Dispense Refill  . milk thistle 175 MG tablet Take 175 mg by mouth daily.      . multivitamin-iron-minerals-folic acid (CENTRUM) chewable tablet Chew 1 tablet by mouth daily.      . pantoprazole (PROTONIX) 40 MG tablet Take 40 mg by mouth daily.       . tamsulosin (FLOMAX) 0.4 MG CAPS Take 0.4 mg by mouth.       No current facility-administered medications for this visit.     Objective: Blood pressure 110/70, pulse 74, temperature 98.7 F (37.1 C), temperature source Oral, resp. rate 18, height 6' (1.829 m), weight 208 lb 14.4 oz (94.756 kg). Patient is alert and in no acute distress. He does not have asterixis. Conjunctiva is pink. Sclera is nonicteric Oropharyngeal mucosa is normal. No neck masses or thyromegaly noted. Cardiac exam with regular rhythm normal S1 and S2. No murmur or gallop noted. Lungs are clear to auscultation. Abdomen is symmetrical. It is soft and nontender. Spleen edge is easily palpable. Liver edge is indistinct. He has small umbilicus hernia which is only apparent when he coughs. Inguinal herniae not appreciated in supine position.  Shifting dullness is absent. No LE  edema or clubbing noted. He has hypopigmented areas over his for head back of his neck as well as his hands(vitiligo).  Labs/studies Results: Liver biopsy on 10-04-2012 revealed steatohepatitis and mild portal and focal sinusoidal fibrosis.   Assessment:  #1. Bloating of recent onset. No evidence of ascites. He does not have any associated symptoms such as abdominal pain nausea vomiting or diarrhea. Therefore the symptom would appear to be nonspecific. If bloating gets worse will consider abdominal ultrasound to rule out ascites. #2.NAFLD. Based on imaging studies he appears to have cirrhosis by liver biopsy did not show stage III or 4 fibrosis. I believe this is due to sampling error. NAFLD is an independent risk for cardiovascular disease. Would ask Dr. Caryn Section to consider cardiac evaluation. #3. GERD complicated by short segment Barrett's esophagus. Heartburn is well controlled with PPI.    Plan:  Patient advised to continue regular physical activity i.e. brisk walking at least 3-4 weeks. He will call if bloating gets worse. Will discuss need for cardiac evaluation with Dr. [name]. Office visit in 4 months.

## 2013-02-23 NOTE — Patient Instructions (Signed)
Try to drink two cups of coffee daily. Notify if bloating gets worse

## 2013-03-17 ENCOUNTER — Ambulatory Visit (INDEPENDENT_AMBULATORY_CARE_PROVIDER_SITE_OTHER): Payer: Medicare Other | Admitting: Internal Medicine

## 2013-03-24 ENCOUNTER — Telehealth (INDEPENDENT_AMBULATORY_CARE_PROVIDER_SITE_OTHER): Payer: Self-pay | Admitting: General Surgery

## 2013-03-24 NOTE — Telephone Encounter (Signed)
Dr. Laural Golden called me today and informed me that Stephen Burch appears stable from a GI standpoint. He has nonalcoholic, noninfectious cirrhosis, probably secondary to fatty liver. He has splenomegaly and mild pancytopenia, portal hypertension, but no ascites. He has bilateral inguinal hernias and would like these repaired. He has been followed regularly by his urologist, Dr. Freda Munro, who he feels that his urologic problems are stable. He is now on Flomax.   [name].  is going to call him in for a preop examination and probable scheduling of his bilateral inguinal hernia repairs.   [name]. Dalbert Batman, M.D., [facility]

## 2013-04-02 ENCOUNTER — Ambulatory Visit (INDEPENDENT_AMBULATORY_CARE_PROVIDER_SITE_OTHER): Payer: Medicare Other | Admitting: General Surgery

## 2013-04-02 ENCOUNTER — Encounter (INDEPENDENT_AMBULATORY_CARE_PROVIDER_SITE_OTHER): Payer: Self-pay | Admitting: General Surgery

## 2013-04-02 VITALS — BP 118/78 | HR 68 | Temp 97.3°F | Resp 14 | Ht 72.0 in | Wt 209.6 lb

## 2013-04-02 DIAGNOSIS — K4020 Bilateral inguinal hernia, without obstruction or gangrene, not specified as recurrent: Secondary | ICD-10-CM

## 2013-04-02 NOTE — Progress Notes (Signed)
Patient ID: Stephen Burch, male   DOB: [birth year], 57 y.o.   MRN: [id number]  Chief Complaint  Patient presents with  . Pre-op Exam    pre op for bi ing hernia    HPI [name] is a 57 y.o. male.  He is referred back to me by Dr. Karilyn Cota in reasonable for consideration of repair of his bilateral, recurrent inguinal hernias. Dr. Hoyt Koch is his urologist. Mila Merry is his PCP.  I previously evaluated this patient on 08/01/2012.he has undergone extensive medical violation since I last saw him. He has seen his urologist, diagnosed with BPH and started him Flomax. This has helped some but he still has urinary frequency. Upper endoscopy showed one tiny varix and changes of Barrett's esophagitis. Esophageal biopsy shows Barrett's esophagus but no dysplasia. A liver biopsy showed benign liver with steatosis special stains were negative. Dr. Dionicia Abler called me and informed me that the patient was ensured hernia repair and that he was stable from a GI standpoint. He stated that he has nonalcoholic, noninfectious cirrhosis, probably secondary to fatty liver. He has splenomegaly and mild pancytopenia, portal hypertension but no ascites.  The patient states that he notices a bulge and some pain in his right groin. Less pain  on the left. No nausea vomiting or abdominal pain.  .  The patient had bilateral inguinal hernia repairs 39 years ago by Dr. Jerelene Redden. Mesh was not used. In 1999 he underwent repair of recurrent  Right inguinal hernia(per patient) with mesh and repair left varicocele by Dr. Assunta Gambles in [place].(we have the op note)  He now presents with a one-year history of dysuria which actually has resolved. He denies groin pain. Denies history of alcohol abuse. Denies history of hepatitis. Urologic evaluation reveals BPH. . CT scan was performed which shows a bilateral inguinal hernias, right a little bit larger than left, and the bladder appears to be imaging up into the right  inguinal hernia. Subtle cirrhosis was suspected. Portal hypertension with venous collaterals was documented. Marked splenomegaly was noted. No ascites. . There was one tiny gallstone. There was a left renal calculus.   HPI  Past Medical History  Diagnosis Date  . Asthma   . Kidney stones   . Cirrhosis     Past Surgical History  Procedure Laterality Date  . Replacement total knee      Bilateral knee replacement, 2010, 2012  . Hernia repair    . Esophagogastroduodenoscopy N/A 09/17/2012    Procedure: ESOPHAGOGASTRODUODENOSCOPY (EGD);  Surgeon: Malissa Hippo, MD;  Location: [facility];  Service: Endoscopy;  Laterality: N/A;  200    Family History  Problem Relation Age of Onset  . Colon cancer Neg Hx   . Colon polyps Neg Hx     Social History History  Substance Use Topics  . Smoking status: Former Smoker -- 3 years    Types: Cigarettes  . Smokeless tobacco: Never Used     Comment: none since age 39-1 pack per week when he was smoking   . Alcohol Use: No     Comment: rarely.  Last beer 6 months. Was not a heavy drinker.    Allergies  Allergen Reactions  . Aspirin Shortness Of Breath  . Penicillins Shortness Of Breath    Current Outpatient Prescriptions  Medication Sig Dispense Refill  . milk thistle 175 MG tablet Take 175 mg by mouth daily.      . multivitamin-iron-minerals-folic acid (CENTRUM) chewable tablet Chew  1 tablet by mouth daily.      . pantoprazole (PROTONIX) 40 MG tablet Take 40 mg by mouth daily.       . tamsulosin (FLOMAX) 0.4 MG CAPS Take 0.4 mg by mouth.      . vitamin C (ASCORBIC ACID) 500 MG tablet Take 1 tablet (500 mg total) by mouth daily.      . vitamin E (VITAMIN E) 400 UNIT capsule Take 1 capsule (400 Units total) by mouth daily.       No current facility-administered medications for this visit.    Review of Systems Review of Systems  Genitourinary: Positive for dysuria and frequency.    Blood pressure 118/78, pulse 68, temperature  97.3 F (36.3 C), temperature source Temporal, resp. rate 14, height 6' (1.829 m), weight 209 lb 9.6 oz (95.074 kg).  Physical Exam Physical Exam  Constitutional: He is oriented to person, place, and time. He appears well-developed and well-nourished. No distress.  Looks reasonably healthy. Friendly and appropriate  HENT:  Head: Normocephalic.  Nose: Nose normal.  Mouth/Throat: No oropharyngeal exudate.  Eyes: Conjunctivae and EOM are normal. Pupils are equal, round, and reactive to light. Right eye exhibits no discharge. Left eye exhibits no discharge. No scleral icterus.  Neck: Normal range of motion. Neck supple. No JVD present. No tracheal deviation present. No thyromegaly present.  Cardiovascular: Normal rate, regular rhythm, normal heart sounds and intact distal pulses.   No murmur heard. Pulmonary/Chest: Effort normal and breath sounds normal. No stridor. No respiratory distress. He has no wheezes. He has no rales. He exhibits no tenderness.  Abdominal: Soft. Bowel sounds are normal. He exhibits no distension and no mass. There is no tenderness. There is no rebound and no guarding.  Tiny umbilical hernia detected when standing.  Genitourinary:  Bilateral groin incisions. Small bilateral inguinal hernias. Large varicocele in the scrotum. Testes descended.  Musculoskeletal: Normal range of motion. He exhibits no edema and no tenderness.  Lymphadenopathy:    He has no cervical adenopathy.  Neurological: He is alert and oriented to person, place, and time. He has normal reflexes. Coordination normal.  Skin: Skin is warm and dry. No rash noted. He is not diaphoretic. No erythema. No pallor.  Psychiatric: He has a normal mood and affect. His behavior is normal. Judgment and thought content normal.    Data Reviewed My old records. CT scan. Endoscopy report. Urologic evaluation. Liver biopsy.  Assessment     bilateral inguinal hernias. These are now becoming more symptomatic. I have  offered repair, and I think that this should be done through an open anterior approach because of his cirrhosis, portal hypertension, and low-grade coagulopathy.   New finding of nonalcoholic, noninfectious cirrhosis, portal hypertension, and marked splenomegaly.   Barrett's esophagus, no dysplasia. Marland Kitchen  BPH. Followed by Dr. Frann Rider  .      Plan    Offered to repair his bilateral inguinal hernias with mesh. I explained to him that he is at increased risk for [place] recurrence, nerve damage, injury or removal of the testicles, injury to the bladder. I told him that these risks are increased compared to elective initial surgery but acceptable. He would like to have this done because his symptoms are progressive.  He will be scheduled for open repair of bilateral inguinal hernias with mesh. We will plan to observe him in the hospital overnight. This will be done a [place] at his request.        Angelia Mould. Derrell Lolling,  M.D., [facility] General and Minimally invasive Surgery Breast and Colorectal Surgery Office:   [phone] Pager:   [phone]  04/02/2013, 9:47 AM

## 2013-04-02 NOTE — Patient Instructions (Signed)
You have bilateral, recurrent inguinal hernias. The right side has been operated on twice and the left side has been operated on once. These are likely to slowly enlarge and become more painful over time.  We have talked about the increased risks that are associated with your liver disease, bleeding, and the fact that this is reoperative surgery.  Although you are at increased risk for all these complications that we discussed, the risks are acceptable.  You will be scheduled for bilateral open repair of your bilateral inguinal hernias with mesh.     Inguinal Hernia, Adult Muscles help keep everything in the body in its proper place. But if a weak spot in the muscles develops, something can poke through. That is called a hernia. When this happens in the lower part of the belly (abdomen), it is called an inguinal hernia. (It takes its name from a part of the body in this region called the inguinal canal.) A weak spot in the wall of muscles lets some fat or part of the small intestine bulge through. An inguinal hernia can develop at any age. Men get them more often than women. CAUSES  In adults, an inguinal hernia develops over time.  It can be triggered by:  Suddenly straining the muscles of the lower abdomen.  Lifting heavy objects.  Straining to have a bowel movement. Difficult bowel movements (constipation) can lead to this.  Constant coughing. This may be caused by smoking or lung disease.  Being overweight.  Being pregnant.  Working at a job that requires long periods of standing or heavy lifting.  Having had an inguinal hernia before. One type can be an emergency situation. It is called a strangulated inguinal hernia. It develops if part of the small intestine slips through the weak spot and cannot get back into the abdomen. The blood supply can be cut off. If that happens, part of the intestine may die. This situation requires emergency surgery. SYMPTOMS  Often, a small  inguinal hernia has no symptoms. It is found when a healthcare provider does a physical exam. Larger hernias usually have symptoms.   In adults, symptoms may include:  A lump in the groin. This is easier to see when the person is standing. It might disappear when lying down.  In men, a lump in the scrotum.  Pain or burning in the groin. This occurs especially when lifting, straining or coughing.  A dull ache or feeling of pressure in the groin.  Signs of a strangulated hernia can include:  A bulge in the groin that becomes very painful and tender to the touch.  A bulge that turns red or purple.  Fever, nausea and vomiting.  Inability to have a bowel movement or to pass gas. DIAGNOSIS  To decide if you have an inguinal hernia, a healthcare provider will probably do a physical examination.  This will include asking questions about any symptoms you have noticed.  The healthcare provider might feel the groin area and ask you to cough. If an inguinal hernia is felt, the healthcare provider may try to slide it back into the abdomen.  Usually no other tests are needed. TREATMENT  Treatments can vary. The size of the hernia makes a difference. Options include:  Watchful waiting. This is often suggested if the hernia is small and you have had no symptoms.  No medical procedure will be done unless symptoms develop.  You will need to watch closely for symptoms. If any occur, contact your healthcare  provider right away.  Surgery. This is used if the hernia is larger or you have symptoms.  Open surgery. This is usually an outpatient procedure (you will not stay overnight in a hospital). An cut (incision) is made through the skin in the groin. The hernia is put back inside the abdomen. The weak area in the muscles is then repaired by herniorrhaphy or hernioplasty. Herniorrhaphy: in this type of surgery, the weak muscles are sewn back together. Hernioplasty: a patch or mesh is used to close  the weak area in the abdominal wall.  Laparoscopy. In this procedure, a surgeon makes small incisions. A thin tube with a tiny video camera (called a laparoscope) is put into the abdomen. The surgeon repairs the hernia with mesh by looking with the video camera and using two long instruments. HOME CARE INSTRUCTIONS   After surgery to repair an inguinal hernia:  You will need to take pain medicine prescribed by your healthcare provider. Follow all directions carefully.  You will need to take care of the wound from the incision.  Your activity will be restricted for awhile. This will probably include no heavy lifting for several weeks. You also should not do anything too active for a few weeks. When you can return to work will depend on the type of job that you have.  During "watchful waiting" periods, you should:  Maintain a healthy weight.  Eat a diet high in fiber (fruits, vegetables and whole grains).  Drink plenty of fluids to avoid constipation. This means drinking enough water and other liquids to keep your urine clear or pale yellow.  Do not lift heavy objects.  Do not stand for long periods of time.  Quit smoking. This should keep you from developing a frequent cough. SEEK MEDICAL CARE IF:   A bulge develops in your groin area.  You feel pain, a burning sensation or pressure in the groin. This might be worse if you are lifting or straining.  You develop a fever of more than 100.5 F (38.1 C). SEEK IMMEDIATE MEDICAL CARE IF:   Pain in the groin increases suddenly.  A bulge in the groin gets bigger suddenly and does not go down.  For men, there is sudden pain in the scrotum. Or, the size of the scrotum increases.  A bulge in the groin area becomes red or purple and is painful to touch.  You have nausea or vomiting that does not go away.  You feel your heart beating much faster than normal.  You cannot have a bowel movement or pass gas.  You develop a fever of  more than 102.0 F (38.9 C). Document Released: 11/04/2008 Document Revised: 09/10/2011 Document Reviewed: 11/04/2008 [facility] Patient Information 2014 [place], [place].

## 2013-04-29 ENCOUNTER — Encounter (HOSPITAL_COMMUNITY): Payer: Self-pay | Admitting: Emergency Medicine

## 2013-04-29 ENCOUNTER — Emergency Department (HOSPITAL_COMMUNITY)
Admission: EM | Admit: 2013-04-29 | Discharge: 2013-04-29 | Disposition: A | Discharge status: Home / Self Care | Payer: Medicare Other | Attending: Emergency Medicine | Admitting: Emergency Medicine

## 2013-04-29 DIAGNOSIS — Z79899 Other long term (current) drug therapy: Secondary | ICD-10-CM | POA: Insufficient documentation

## 2013-04-29 DIAGNOSIS — Z8719 Personal history of other diseases of the digestive system: Secondary | ICD-10-CM | POA: Insufficient documentation

## 2013-04-29 DIAGNOSIS — J45909 Unspecified asthma, uncomplicated: Secondary | ICD-10-CM | POA: Insufficient documentation

## 2013-04-29 DIAGNOSIS — Z87442 Personal history of urinary calculi: Secondary | ICD-10-CM | POA: Insufficient documentation

## 2013-04-29 DIAGNOSIS — Z880 Allergy status to penicillin: Secondary | ICD-10-CM | POA: Insufficient documentation

## 2013-04-29 DIAGNOSIS — Z87891 Personal history of nicotine dependence: Secondary | ICD-10-CM | POA: Insufficient documentation

## 2013-04-29 DIAGNOSIS — L0231 Cutaneous abscess of buttock: Secondary | ICD-10-CM | POA: Insufficient documentation

## 2013-04-29 MED ORDER — BUPIVACAINE HCL (PF) 0.25 % IJ SOLN
20.0000 mL | Freq: Once | INTRAMUSCULAR | Status: AC
Start: 2013-04-29 — End: 2013-04-29
  Administered 2013-04-29: 20 mL
  Filled 2013-04-29: qty 30

## 2013-04-29 MED ORDER — DOXYCYCLINE HYCLATE 100 MG PO CAPS: 100.0000 mg | ORAL_CAPSULE | Freq: Two times a day (BID) | ORAL | Status: AC

## 2013-04-29 MED ORDER — OXYCODONE-ACETAMINOPHEN 5-325 MG PO TABS: 1.0000 | ORAL_TABLET | Freq: Four times a day (QID) | ORAL | Status: DC | PRN

## 2013-04-29 MED ORDER — OXYCODONE-ACETAMINOPHEN 5-325 MG PO TABS
2.0000 | ORAL_TABLET | Freq: Once | ORAL | Status: AC
  Administered 2013-04-29: 2 via ORAL
  Filled 2013-04-29: qty 2

## 2013-04-29 MED ORDER — DOXYCYCLINE HYCLATE 100 MG PO TABS
100.0000 mg | ORAL_TABLET | Freq: Once | ORAL | Status: AC
  Administered 2013-04-29: 100 mg via ORAL
  Filled 2013-04-29: qty 1

## 2013-04-29 MED ORDER — CIPROFLOXACIN HCL 250 MG PO TABS
500.0000 mg | ORAL_TABLET | Freq: Once | ORAL | Status: AC
  Administered 2013-04-29: 500 mg via ORAL
  Filled 2013-04-29: qty 2

## 2013-04-29 MED ORDER — ONDANSETRON HCL 4 MG PO TABS
4.0000 mg | ORAL_TABLET | Freq: Once | ORAL | Status: AC
  Administered 2013-04-29: 4 mg via ORAL
  Filled 2013-04-29: qty 1

## 2013-04-29 NOTE — ED Notes (Signed)
Pt c/o abscess to buttocks since Friday.

## 2013-04-29 NOTE — ED Provider Notes (Signed)
CSN: [id number]     Arrival date & time 04/29/13  1812 History   First MD Initiated Contact with Patient 04/29/13 1852     Chief Complaint  Patient presents with  . Abscess   (Consider location/radiation/quality/duration/timing/severity/associated sxs/prior Treatment) Patient is a 57 y.o. male presenting with abscess. The history is provided by the patient.  Abscess Location:  Ano-genital Ano-genital abscess location:  L buttock Abscess quality: painful, redness and warmth   Abscess quality: not draining   Red streaking: no   Duration:  5 days Progression:  Worsening Pain details:    Quality:  Aching and tightness   Severity:  Severe   Duration:  5 days   Timing:  Constant   Progression:  Worsening Chronicity:  New Context: not diabetes   Relieved by:  Nothing Ineffective treatments:  Oral antibiotics Associated symptoms: no fever, no nausea and no vomiting   Risk factors: no hx of MRSA     Past Medical History  Diagnosis Date  . Asthma   . Kidney stones   . Cirrhosis    Past Surgical History  Procedure Laterality Date  . Replacement total knee      Bilateral knee replacement, 2010, 2012  . Hernia repair    . Esophagogastroduodenoscopy N/A 09/17/2012    Procedure: ESOPHAGOGASTRODUODENOSCOPY (EGD);  Surgeon: Rogene Houston, MD;  Location: [facility];  Service: Endoscopy;  Laterality: N/A;  200   Family History  Problem Relation Age of Onset  . Colon cancer Neg Hx   . Colon polyps Neg Hx    History  Substance Use Topics  . Smoking status: Former Smoker -- 3 years    Types: Cigarettes  . Smokeless tobacco: Never Used     Comment: none since age 57-1 pack per week when he was smoking   . Alcohol Use: No     Comment: rarely.  Last beer 6 months. Was not a heavy drinker.    Review of Systems  Constitutional: Negative for fever and activity change.       All ROS Neg except as noted in HPI  HENT: Negative for nosebleeds.   Eyes: Negative for photophobia and  discharge.  Respiratory: Negative for cough, shortness of breath and wheezing.   Cardiovascular: Negative for chest pain and palpitations.  Gastrointestinal: Negative for nausea, vomiting, abdominal pain and blood in stool.  Genitourinary: Negative for dysuria, frequency and hematuria.  Musculoskeletal: Negative for arthralgias, back pain and neck pain.  Skin: Negative.   Neurological: Negative for dizziness, seizures and speech difficulty.  Psychiatric/Behavioral: Negative for hallucinations and confusion.    Allergies  Aspirin and Penicillins  Home Medications   Current Outpatient Rx  Name  Route  Sig  Dispense  Refill  . HYDROcodone-acetaminophen (NORCO) 7.5-325 MG per tablet   Oral   Take 1 tablet by mouth every 4 (four) hours as needed. For pain         . levofloxacin (LEVAQUIN) 500 MG tablet   Oral   Take 500 mg by mouth daily. 10 day course starting on 04/28/2013         . pantoprazole (PROTONIX) 40 MG tablet   Oral   Take 40 mg by mouth daily.          . tamsulosin (FLOMAX) 0.4 MG CAPS   Oral   Take 0.4 mg by mouth daily after supper.          . vitamin C (ASCORBIC ACID) 500 MG tablet   Oral  Take 1 tablet (500 mg total) by mouth daily.         Marland Kitchen doxycycline (VIBRAMYCIN) 100 MG capsule   Oral   Take 1 capsule (100 mg total) by mouth 2 (two) times daily.   14 capsule   0   . oxyCODONE-acetaminophen (PERCOCET/ROXICET) 5-325 MG per tablet   Oral   Take 1 tablet by mouth every 6 (six) hours as needed for pain.   20 tablet   0    BP 131/94  Pulse 84  Temp(Src) 98.7 F (37.1 C) (Oral)  Resp 18  Ht 6' (1.829 m)  Wt 212 lb (96.163 kg)  BMI 28.75 kg/m2  SpO2 98% Physical Exam  Nursing note and vitals reviewed. Constitutional: He is oriented to person, place, and time. He appears well-developed and well-nourished.  Non-toxic appearance.  HENT:  Head: Normocephalic.  Right Ear: Tympanic membrane and external ear normal.  Left Ear: Tympanic  membrane and external ear normal.  Eyes: EOM and lids are normal. Pupils are equal, round, and reactive to light.  Neck: Normal range of motion. Neck supple. Carotid bruit is not present.  Cardiovascular: Normal rate, regular rhythm, normal heart sounds, intact distal pulses and normal pulses.   Pulmonary/Chest: Breath sounds normal. No respiratory distress.  Abdominal: Soft. Bowel sounds are normal. There is no tenderness. There is no guarding.  Genitourinary:  Moderate size, painful abscess of the left buttock. No drainage. No red streaking. Anus not involved.  Musculoskeletal: Normal range of motion.  Lymphadenopathy:       Head (right side): No submandibular adenopathy present.       Head (left side): No submandibular adenopathy present.    He has no cervical adenopathy.  Neurological: He is alert and oriented to person, place, and time. He has normal strength. No cranial nerve deficit or sensory deficit.  Skin: Skin is warm and dry.  Psychiatric: He has a normal mood and affect. His speech is normal.    ED Course  INCISION AND DRAINAGE Date/Time: 04/29/2013 8:09 PM Performed by: Lenox Ahr Authorized by: Lenox Ahr Consent: Verbal consent obtained. Risks and benefits: risks, benefits and alternatives were discussed Consent given by: patient Patient understanding: patient states understanding of the procedure being performed Patient identity confirmed: arm band Time out: Immediately prior to procedure a "time out" was called to verify the correct patient, procedure, equipment, support staff and site/side marked as required. Type: abscess Body area: anogenital Anesthesia: local infiltration Local anesthetic: bupivacaine 0.25% without epinephrine Patient sedated: no Scalpel size: 11 Incision type: single straight Complexity: simple Drainage: purulent Drainage amount: copious Wound treatment: wound left open Patient tolerance: Patient tolerated the procedure well  with no immediate complications. Comments: Culture sent to the lab. Sterile dressing applied.   (including critical care time) Labs Review Labs Reviewed  CULTURE, ROUTINE-ABSCESS   Imaging Review No results found.  EKG Interpretation   None       MDM   1. Abscess of buttock, left    *I have reviewed nursing notes, vital signs, and all appropriate lab and imaging results for this patient.**  Patient states he has been dealing with an abscess of the left buttocks for about 5 days. He's been seen by his primary physician, placed on antibiotics and hydrocodone for pain. The patient states that neither of these are working for him. He requests reevaluation of the abscess, as well as incision and drainage of possible.  Incision and drainage carried out with a to  the abscess of the left buttocks. Patient tolerated the procedure without problem.  Prescription for Percocet and doxycycline given. Patient will continue his Levaquin. He is to start warm tub soaks on tomorrow. He is to see his primary physician or return to the emergency department if not improving.  Lenox Ahr, PA-C 04/29/13 2019

## 2013-04-30 NOTE — ED Provider Notes (Signed)
Medical screening examination/treatment/procedure(s) were performed by non-physician practitioner and as supervising physician I was immediately available for consultation/collaboration.  EKG Interpretation   None        Jasper Riling. Alvino Chapel, MD 04/30/13 [phone]

## 2013-05-01 ENCOUNTER — Encounter (INDEPENDENT_AMBULATORY_CARE_PROVIDER_SITE_OTHER): Payer: Self-pay

## 2013-05-01 NOTE — Progress Notes (Unsigned)
Pt walked in today.  He is a pt of Dr Darrel Hoover and is scheduled for [facility] rep on 11/21.  He states he had a boil on his buttock that he went to the ER for.  He is on Doxycycline.  It is improving.  He will follow up with his Primary MD 11/15.  He wants to make sure this will not interfere with surgery.  I told him since it is getting better and he is on antibiotics that he will probably be ok by surgery day.  I asked him to call if he gets fever and if it seems to stop healing.  He will keep [place] posted.

## 2013-05-02 LAB — CULTURE, ROUTINE-ABSCESS

## 2013-05-12 NOTE — Pre-Procedure Instructions (Signed)
Stephen Burch  05/12/2013   Your procedure is scheduled on:  Friday, November 21st   Report to [facility]  2 * 3 at 5;30 AM.   Call this number if you have problems the morning of surgery: [phone]   Remember:   Do not eat food or drink liquids after midnight Thursday.   Take these medicines the morning of surgery with A SIP OF WATER: Protonix, Pain medication   Do not wear jewelry.  Do not wear lotions, powders, or colognes. You may wear deodorant.    Men may shave face and neck.  Do not bring valuables to the hospital.  [facility] is not responsible  for any belongings or valuables.               Contacts, dentures or bridgework may not be worn into surgery.  Leave suitcase in the car. After surgery it may be brought to your room.  For patients admitted to the hospital, discharge time is determined by your treatment team.               Name and phone number of your driver:    Special Instructions: Shower using CHG 2 nights before surgery and the night before surgery.  If you shower the day of surgery use CHG.  Use special wash - you have one bottle of CHG for all showers.  You should use approximately 1/3 of the bottle for each shower.   Please read over the following fact sheets that you were given: Pain Booklet, MRSA Information and Surgical Site Infection Prevention

## 2013-05-13 ENCOUNTER — Encounter (HOSPITAL_COMMUNITY)
Admission: RE | Admit: 2013-05-13 | Discharge: 2013-05-13 | Disposition: A | Discharge status: Home / Self Care | Payer: Medicare Other | Source: Ambulatory Visit | Attending: General Surgery | Admitting: General Surgery

## 2013-05-13 ENCOUNTER — Encounter (HOSPITAL_COMMUNITY): Payer: Self-pay

## 2013-05-13 DIAGNOSIS — Z01812 Encounter for preprocedural laboratory examination: Secondary | ICD-10-CM | POA: Insufficient documentation

## 2013-05-13 LAB — URINALYSIS, ROUTINE W REFLEX MICROSCOPIC
Bilirubin Urine: NEGATIVE
Ketones, ur: NEGATIVE mg/dL
Nitrite: NEGATIVE
Protein, ur: NEGATIVE mg/dL
Specific Gravity, Urine: 1.003 — ABNORMAL LOW (ref 1.005–1.030)
Urobilinogen, UA: 0.2 mg/dL (ref 0.0–1.0)

## 2013-05-13 LAB — PROTIME-INR
INR: 1 (ref 0.00–1.49)
Prothrombin Time: 13 seconds (ref 11.6–15.2)

## 2013-05-13 LAB — COMPREHENSIVE METABOLIC PANEL
Albumin: 4 g/dL (ref 3.5–5.2)
BUN: 8 mg/dL (ref 6–23)
CO2: 27 mEq/L (ref 19–32)
Chloride: 102 mEq/L (ref 96–112)
Creatinine, Ser: 0.93 mg/dL (ref 0.50–1.35)
GFR calc Af Amer: >90 mL/min (ref >90)
GFR calc non Af Amer: >90 mL/min (ref >90)
Total Bilirubin: 0.9 mg/dL (ref 0.3–1.2)

## 2013-05-13 LAB — CBC WITH DIFFERENTIAL/PLATELET
Basophils Relative: 0 % (ref 0–1)
Eosinophils Absolute: 0.3 10*3/uL (ref 0.0–0.7)
HCT: 45.8 % (ref 39.0–52.0)
Hemoglobin: 16 g/dL (ref 13.0–17.0)
Lymphocytes Relative: 18 % (ref 12–46)
MCH: 30.7 pg (ref 26.0–34.0)
MCHC: 34.9 g/dL (ref 30.0–36.0)
Monocytes Absolute: 0.4 10*3/uL (ref 0.1–1.0)
Monocytes Relative: 8 % (ref 3–12)
Neutro Abs: 3.2 10*3/uL (ref 1.7–7.7)
WBC: 4.8 10*3/uL (ref 4.0–10.5)

## 2013-05-13 LAB — APTT: aPTT: 27 seconds (ref 24–37)

## 2013-05-13 NOTE — Progress Notes (Addendum)
Pt has 'mild' case of cirrhosis.  He's had a liver biopsy done recently.  They think his cirrhosis is a hereditary thing..the patient denies drinking, ellicit drug use.   I am going to get notes from his PCP, Dr. [name] @ [facility].   DA Pt also states he had a boil lanced a while ago...culture was sent, but he hasn't been notified of results.  I went ahead and did a PCR to cover all bases.   DA Spoke with Dr. Dalbert Batman regarding low platelet count.  Surgery to continue as planned.  DA

## 2013-05-13 NOTE — Progress Notes (Addendum)
05/13/13 0927  OBSTRUCTIVE SLEEP APNEA  Have you ever been diagnosed with sleep apnea through a sleep study? No  Do you snore loudly (loud enough to be heard through closed doors)?  1  Do you often feel tired, fatigued, or sleepy during the daytime? 1  Has anyone observed you stop breathing during your sleep? 0  Do you have, or are you being treated for high blood pressure? 0  BMI more than 35 kg/m2? 0  Age over 59 years old? 1  Neck circumference greater than 40 cm/18 inches? 0  Gender: 1  Obstructive Sleep Apnea Score 4  Score 4 or greater  Results sent to PCP   According to our STOP Bang tool for identifying OSA, this patient scored a 4..Thanks

## 2013-05-14 ENCOUNTER — Encounter (HOSPITAL_COMMUNITY): Payer: Self-pay | Admitting: Pharmacy Technician

## 2013-05-19 NOTE — H&P (Signed)
Stephen Burch   MRN:  [id number]   Description: 57 year old male  Provider: Ernestene Mention, MD  Department: [facility]       Diagnoses    Bilateral inguinal hernia    -  Primary    550.92      Reason for Visit    Pre-op Exam    pre op for bi ing hernia        Current Vitals - Last Recorded    BP Pulse Temp(Src) Resp Ht Wt    118/78 68 97.3 F (36.3 C) (Temporal) 14 6' (1.829 m) 209 lb 9.6 oz (95.074 kg)    BMI 28.42 kg/m2                    History and Physical    Ernestene Mention, MD    Status: Signed            Patient ID: Stephen Burch, male   DOB: Dec 29, 1955, 57 y.o.   MRN: [id number]            HPI Stephen Burch is a 57 y.o. male.  He is referred back to me by Dr. Renae Fickle in reasonable for consideration of repair of his bilateral, recurrent inguinal hernias. Dr. Hoyt Koch is his urologist. Mila Merry is his PCP.   I previously evaluated this patient on 08/01/2012.he has undergone extensive medical evaluation since I last saw him. He has seen his urologist, diagnosed with BPH and started him Flomax. This has helped some but he still has urinary frequency. Upper endoscopy showed one tiny varix and changes of Barrett's esophagitis. Esophageal biopsy shows Barrett's esophagus but no dysplasia. A liver biopsy showed benign liver with steatosis special stains were negative. Dr. Renae Fickle called me and informed me that the patient was desiring  hernia repair and that he was stable from a GI standpoint. He stated that he has nonalcoholic, noninfectious cirrhosis, probably secondary to fatty liver. He has splenomegaly and mild pancytopenia, portal hypertension but no ascites.   The patient states that he notices a bulge and some pain in his right groin. Less pain  on the left. No nausea vomiting or abdominal pain.   .   The patient had bilateral inguinal hernia repairs 39 years ago by Dr. Jerelene Redden. Mesh was not used. In 1999 he underwent repair of  recurrent  Right inguinal hernia(per patient) with mesh and repair left varicocele by Dr. Assunta Gambles in [place].(we have the op note)   He now presents with a one-year history of dysuria which actually has resolved. He denies groin pain. Denies history of alcohol abuse. Denies history of hepatitis. Urologic evaluation reveals BPH. . CT scan was performed which shows a bilateral inguinal hernias, right a little bit larger than left, and the bladder appears to be imaging up into the right inguinal hernia. Subtle cirrhosis was suspected. Portal hypertension with venous collaterals was documented. Marked splenomegaly was noted. No ascites. . There was one tiny gallstone. There was a left renal calculus.         Past Medical History   Diagnosis  Date   .  Asthma     .  Kidney stones     .  Cirrhosis           Past Surgical History   Procedure  Laterality  Date   .  Replacement total knee           Bilateral  knee replacement, 2010, 2012   .  Hernia repair       .  Esophagogastroduodenoscopy  N/A  09/17/2012       Procedure: ESOPHAGOGASTRODUODENOSCOPY (EGD);  Surgeon: Malissa Hippo, MD;  Location: [facility];  Service: Endoscopy;  Laterality: N/A;  200         Family History   Problem  Relation  Age of Onset   .  Colon cancer  Neg Hx     .  Colon polyps  Neg Hx          Social History History   Substance Use Topics   .  Smoking status:  Former Smoker -- 3 years       Types:  Cigarettes   .  Smokeless tobacco:  Never Used         Comment: none since age 21-1 pack per week when he was smoking    .  Alcohol Use:  No         Comment: rarely.  Last beer 6 months. Was not a heavy drinker.         Allergies   Allergen  Reactions   .  Aspirin  Shortness Of Breath   .  Penicillins  Shortness Of Breath         Current Outpatient Prescriptions   Medication  Sig  Dispense  Refill   .  milk thistle 175 MG tablet  Take 175 mg by mouth daily.         .   multivitamin-iron-minerals-folic acid (CENTRUM) chewable tablet  Chew 1 tablet by mouth daily.         .  pantoprazole (PROTONIX) 40 MG tablet  Take 40 mg by mouth daily.          .  tamsulosin (FLOMAX) 0.4 MG CAPS  Take 0.4 mg by mouth.         .  vitamin C (ASCORBIC ACID) 500 MG tablet  Take 1 tablet (500 mg total) by mouth daily.         .  vitamin E (VITAMIN E) 400 UNIT capsule  Take 1 capsule (400 Units total) by mouth daily.            Review of Systems   Genitourinary: Positive for dysuria and frequency.      Blood pressure 118/78, pulse 68, temperature 97.3 F (36.3 C), temperature source Temporal, resp. rate 14, height 6' (1.829 m), weight 209 lb 9.6 oz (95.074 kg).   Physical Exam  Constitutional: He is oriented to person, place, and time. He appears well-developed and well-nourished. No distress.  Looks reasonably healthy. Friendly and appropriate  HENT:   Head: Normocephalic.   Nose: Nose normal.   Mouth/Throat: No oropharyngeal exudate.  Eyes: Conjunctivae and EOM are normal. Pupils are equal, round, and reactive to light. Right eye exhibits no discharge. Left eye exhibits no discharge. No scleral icterus.  Neck: Normal range of motion. Neck supple. No JVD present. No tracheal deviation present. No thyromegaly present.  Cardiovascular: Normal rate, regular rhythm, normal heart sounds and intact distal pulses.    No murmur heard. Pulmonary/Chest: Effort normal and breath sounds normal. No stridor. No respiratory distress. He has no wheezes. He has no rales. He exhibits no tenderness.  Abdominal: Soft. Bowel sounds are normal. He exhibits no distension and no mass. There is no tenderness. There is no rebound and no guarding.  Tiny umbilical hernia detected when standing.  Genitourinary:  Bilateral groin  incisions. Small bilateral inguinal hernias. Large varicocele in the scrotum. Testes descended.  Musculoskeletal: Normal range of motion. He exhibits no edema and no  tenderness.  Lymphadenopathy:    He has no cervical adenopathy.  Neurological: He is alert and oriented to person, place, and time. He has normal reflexes. Coordination normal.  Skin: Skin is warm and dry. No rash noted. He is not diaphoretic. No erythema. No pallor.  Psychiatric: He has a normal mood and affect. His behavior is normal. Judgment and thought content normal.      Data Reviewed My old records. CT scan. Endoscopy report. Urologic evaluation. Liver biopsy.   Assessment      bilateral inguinal hernias. These are now becoming more symptomatic. I have offered repair, and I think that this should be done through an open anterior approach because of his cirrhosis, portal hypertension, and low-grade coagulopathy.    New finding of nonalcoholic, noninfectious cirrhosis, portal hypertension, and marked splenomegaly.    Barrett's esophagus, no dysplasia. Marland Kitchen   BPH. Followed by Dr. Frann Rider   .        Plan    Offered to repair his bilateral inguinal hernias with mesh. I explained to him that he is at increased risk for [place] recurrence, nerve damage, injury or removal of the testicles, injury to the bladder. I told him that these risks are increased compared to elective initial surgery but acceptable. He would like to have this done because his symptoms are progressive.   He will be scheduled for open repair of bilateral inguinal hernias with mesh. We will plan to observe him in the hospital overnight. This will be done a [place] at his request.           Angelia Mould. Derrell Lolling, M.D., [facility] General and Minimally invasive Surgery Breast and Colorectal Surgery Office:   [phone] Pager:   [phone]

## 2013-05-21 MED ORDER — VANCOMYCIN HCL 10 G IV SOLR
1500.0000 mg | INTRAVENOUS | Status: AC
  Administered 2013-05-22: 1500 mg via INTRAVENOUS
  Filled 2013-05-21: qty 1500

## 2013-05-22 ENCOUNTER — Encounter (HOSPITAL_COMMUNITY): Payer: Self-pay

## 2013-05-22 ENCOUNTER — Encounter (HOSPITAL_COMMUNITY)
Admission: RE | Disposition: A | Discharge status: Home / Self Care | Payer: Self-pay | Source: Ambulatory Visit | Attending: General Surgery

## 2013-05-22 ENCOUNTER — Encounter (HOSPITAL_COMMUNITY): Payer: Medicare Other | Admitting: Anesthesiology

## 2013-05-22 ENCOUNTER — Ambulatory Visit (HOSPITAL_COMMUNITY): Payer: Medicare Other | Admitting: Anesthesiology

## 2013-05-22 ENCOUNTER — Observation Stay (HOSPITAL_COMMUNITY)
Admission: RE | Admit: 2013-05-22 | Discharge: 2013-05-23 | Disposition: A | Discharge status: Home / Self Care | Payer: Medicare Other | Source: Ambulatory Visit | Attending: General Surgery | Admitting: General Surgery

## 2013-05-22 DIAGNOSIS — K4031 Unilateral inguinal hernia, with obstruction, without gangrene, recurrent: Principal | ICD-10-CM | POA: Insufficient documentation

## 2013-05-22 DIAGNOSIS — K8020 Calculus of gallbladder without cholecystitis without obstruction: Secondary | ICD-10-CM | POA: Insufficient documentation

## 2013-05-22 DIAGNOSIS — K4020 Bilateral inguinal hernia, without obstruction or gangrene, not specified as recurrent: Secondary | ICD-10-CM | POA: Diagnosis present

## 2013-05-22 DIAGNOSIS — D61818 Other pancytopenia: Secondary | ICD-10-CM | POA: Insufficient documentation

## 2013-05-22 DIAGNOSIS — K766 Portal hypertension: Secondary | ICD-10-CM | POA: Insufficient documentation

## 2013-05-22 DIAGNOSIS — Z87891 Personal history of nicotine dependence: Secondary | ICD-10-CM | POA: Insufficient documentation

## 2013-05-22 DIAGNOSIS — R161 Splenomegaly, not elsewhere classified: Secondary | ICD-10-CM | POA: Insufficient documentation

## 2013-05-22 DIAGNOSIS — N289 Disorder of kidney and ureter, unspecified: Secondary | ICD-10-CM | POA: Insufficient documentation

## 2013-05-22 DIAGNOSIS — K7460 Unspecified cirrhosis of liver: Secondary | ICD-10-CM | POA: Insufficient documentation

## 2013-05-22 DIAGNOSIS — K2270 Barrett's esophagus without dysplasia: Secondary | ICD-10-CM | POA: Insufficient documentation

## 2013-05-22 DIAGNOSIS — K4091 Unilateral inguinal hernia, without obstruction or gangrene, recurrent: Secondary | ICD-10-CM

## 2013-05-22 HISTORY — PX: INGUINAL HERNIA REPAIR: SHX194

## 2013-05-22 HISTORY — PX: INSERTION OF MESH: SHX5868

## 2013-05-22 SURGERY — REPAIR, HERNIA, INGUINAL, BILATERAL, ADULT
Anesthesia: General | Site: Groin | Laterality: Bilateral | Wound class: Clean

## 2013-05-22 MED ORDER — LACTATED RINGERS IV SOLN
INTRAVENOUS | Status: DC | PRN
  Administered 2013-05-22 (×2): via INTRAVENOUS

## 2013-05-22 MED ORDER — SODIUM CHLORIDE 0.9 % IJ SOLN: 3.0000 mL | INTRAMUSCULAR | Status: DC | PRN

## 2013-05-22 MED ORDER — ONDANSETRON HCL 4 MG/2ML IJ SOLN
INTRAMUSCULAR | Status: DC | PRN
  Administered 2013-05-22: 4 mg via INTRAVENOUS

## 2013-05-22 MED ORDER — 0.9 % SODIUM CHLORIDE (POUR BTL) OPTIME
TOPICAL | Status: DC | PRN
  Administered 2013-05-22: 1000 mL

## 2013-05-22 MED ORDER — MIDAZOLAM HCL 5 MG/5ML IJ SOLN
INTRAMUSCULAR | Status: DC | PRN
  Administered 2013-05-22: 2 mg via INTRAVENOUS

## 2013-05-22 MED ORDER — VECURONIUM BROMIDE 10 MG IV SOLR
INTRAVENOUS | Status: DC | PRN
  Administered 2013-05-22: 3 mg via INTRAVENOUS

## 2013-05-22 MED ORDER — HYDROMORPHONE HCL PF 1 MG/ML IJ SOLN
0.2500 mg | INTRAMUSCULAR | Status: DC | PRN
  Administered 2013-05-22 (×2): 0.5 mg via INTRAVENOUS

## 2013-05-22 MED ORDER — ACETAMINOPHEN 325 MG PO TABS: 650.0000 mg | ORAL_TABLET | ORAL | Status: DC | PRN

## 2013-05-22 MED ORDER — SODIUM CHLORIDE 0.9 % IV SOLN: INTRAVENOUS | Status: DC

## 2013-05-22 MED ORDER — CHLORHEXIDINE GLUCONATE 4 % EX LIQD: 1.0000 "application " | Freq: Once | CUTANEOUS | Status: DC

## 2013-05-22 MED ORDER — FENTANYL CITRATE 0.05 MG/ML IJ SOLN
INTRAMUSCULAR | Status: DC | PRN
  Administered 2013-05-22: 50 ug via INTRAVENOUS
  Administered 2013-05-22: 100 ug via INTRAVENOUS
  Administered 2013-05-22: 50 ug via INTRAVENOUS

## 2013-05-22 MED ORDER — HYDROMORPHONE HCL PF 1 MG/ML IJ SOLN
INTRAMUSCULAR | Status: AC
  Filled 2013-05-22: qty 1

## 2013-05-22 MED ORDER — FENTANYL CITRATE 0.05 MG/ML IJ SOLN: 25.0000 ug | INTRAMUSCULAR | Status: DC | PRN

## 2013-05-22 MED ORDER — VITAMIN C 500 MG PO TABS
500.0000 mg | ORAL_TABLET | Freq: Every day | ORAL | Status: DC
  Administered 2013-05-22 – 2013-05-23 (×2): 500 mg via ORAL
  Filled 2013-05-22 (×2): qty 1

## 2013-05-22 MED ORDER — TAMSULOSIN HCL 0.4 MG PO CAPS
0.4000 mg | ORAL_CAPSULE | Freq: Every day | ORAL | Status: DC
  Administered 2013-05-22: 0.4 mg via ORAL
  Filled 2013-05-22 (×2): qty 1

## 2013-05-22 MED ORDER — OXYCODONE HCL 5 MG PO TABS
ORAL_TABLET | ORAL | Status: AC
  Administered 2013-05-22: 5 mg
  Filled 2013-05-22: qty 1

## 2013-05-22 MED ORDER — LIDOCAINE HCL (CARDIAC) 20 MG/ML IV SOLN
INTRAVENOUS | Status: DC | PRN
  Administered 2013-05-22 (×2): 50 mg via INTRAVENOUS

## 2013-05-22 MED ORDER — BUPIVACAINE-EPINEPHRINE 0.5% -1:200000 IJ SOLN
INTRAMUSCULAR | Status: DC | PRN
  Administered 2013-05-22: 10 mL

## 2013-05-22 MED ORDER — PROPOFOL 10 MG/ML IV BOLUS
INTRAVENOUS | Status: DC | PRN
  Administered 2013-05-22: 150 mg via INTRAVENOUS

## 2013-05-22 MED ORDER — ONDANSETRON HCL 4 MG/2ML IJ SOLN: 4.0000 mg | Freq: Four times a day (QID) | INTRAMUSCULAR | Status: DC | PRN

## 2013-05-22 MED ORDER — NEOSTIGMINE METHYLSULFATE 1 MG/ML IJ SOLN
INTRAMUSCULAR | Status: DC | PRN
  Administered 2013-05-22: 3 mg via INTRAVENOUS

## 2013-05-22 MED ORDER — DIPHENHYDRAMINE HCL 50 MG/ML IJ SOLN
INTRAMUSCULAR | Status: DC | PRN
  Administered 2013-05-22: 50 mg via INTRAVENOUS

## 2013-05-22 MED ORDER — ACETAMINOPHEN 650 MG RE SUPP: 650.0000 mg | RECTAL | Status: DC | PRN

## 2013-05-22 MED ORDER — BUPIVACAINE-EPINEPHRINE (PF) 0.5% -1:200000 IJ SOLN
INTRAMUSCULAR | Status: AC
  Filled 2013-05-22: qty 10

## 2013-05-22 MED ORDER — DEXAMETHASONE SODIUM PHOSPHATE 4 MG/ML IJ SOLN
INTRAMUSCULAR | Status: DC | PRN
  Administered 2013-05-22: 8 mg via INTRAVENOUS

## 2013-05-22 MED ORDER — SODIUM CHLORIDE 0.9 % IJ SOLN: 3.0000 mL | Freq: Two times a day (BID) | INTRAMUSCULAR | Status: DC

## 2013-05-22 MED ORDER — OXYCODONE HCL 5 MG PO TABS
5.0000 mg | ORAL_TABLET | ORAL | Status: DC | PRN
  Administered 2013-05-22: 10 mg via ORAL
  Filled 2013-05-22: qty 2

## 2013-05-22 MED ORDER — ROCURONIUM BROMIDE 100 MG/10ML IV SOLN
INTRAVENOUS | Status: DC | PRN
  Administered 2013-05-22: 50 mg via INTRAVENOUS

## 2013-05-22 MED ORDER — PANTOPRAZOLE SODIUM 40 MG PO TBEC: 40.0000 mg | DELAYED_RELEASE_TABLET | Freq: Every day | ORAL | Status: DC

## 2013-05-22 MED ORDER — GLYCOPYRROLATE 0.2 MG/ML IJ SOLN
INTRAMUSCULAR | Status: DC | PRN
  Administered 2013-05-22: .4 mg via INTRAVENOUS

## 2013-05-22 MED ORDER — EPHEDRINE SULFATE 50 MG/ML IJ SOLN
INTRAMUSCULAR | Status: DC | PRN
  Administered 2013-05-22 (×2): 5 mg via INTRAVENOUS
  Administered 2013-05-22: 10 mg via INTRAVENOUS

## 2013-05-22 MED ORDER — SODIUM CHLORIDE 0.9 % IV SOLN: 250.0000 mL | INTRAVENOUS | Status: DC | PRN

## 2013-05-22 SURGICAL SUPPLY — 49 items
BLADE SURG 10 STRL SS (BLADE) ×2 IMPLANT
BLADE SURG 15 STRL LF DISP TIS (BLADE) ×1 IMPLANT
BLADE SURG 15 STRL SS (BLADE) ×1
BLADE SURG ROTATE 9660 (MISCELLANEOUS) IMPLANT
CANISTER SUCTION 2500CC (MISCELLANEOUS) IMPLANT
CHLORAPREP W/TINT 26ML (MISCELLANEOUS) ×2 IMPLANT
COVER SURGICAL LIGHT HANDLE (MISCELLANEOUS) ×2 IMPLANT
DERMABOND ADVANCED (GAUZE/BANDAGES/DRESSINGS) ×1
DERMABOND ADVANCED .7 DNX12 (GAUZE/BANDAGES/DRESSINGS) ×1 IMPLANT
DRAIN PENROSE 1/2X12 LTX STRL (WOUND CARE) ×2 IMPLANT
DRAPE LAPAROTOMY TRNSV 102X78 (DRAPE) ×2 IMPLANT
DRAPE UTILITY 15X26 W/TAPE STR (DRAPE) ×4 IMPLANT
ELECT CAUTERY BLADE 6.4 (BLADE) ×2 IMPLANT
ELECT REM PT RETURN 9FT ADLT (ELECTROSURGICAL) ×2
ELECTRODE REM PT RTRN 9FT ADLT (ELECTROSURGICAL) ×1 IMPLANT
GLOVE EUDERMIC 7 POWDERFREE (GLOVE) ×2 IMPLANT
GOWN STRL NON-REIN LRG LVL3 (GOWN DISPOSABLE) ×4 IMPLANT
GOWN STRL REIN XL XLG (GOWN DISPOSABLE) ×2 IMPLANT
KIT BASIN OR (CUSTOM PROCEDURE TRAY) ×2 IMPLANT
KIT ROOM TURNOVER OR (KITS) ×2 IMPLANT
MESH HERNIA 3X6 (Mesh General) ×2 IMPLANT
MESH ULTRAPRO 3X6 7.6X15CM (Mesh General) ×4 IMPLANT
NEEDLE 22X1 1/2 (OR ONLY) (NEEDLE) ×2 IMPLANT
NEEDLE HYPO 25GX1X1/2 BEV (NEEDLE) ×2 IMPLANT
NS IRRIG 1000ML POUR BTL (IV SOLUTION) ×2 IMPLANT
PACK SURGICAL SETUP 50X90 (CUSTOM PROCEDURE TRAY) ×2 IMPLANT
PAD ARMBOARD 7.5X6 YLW CONV (MISCELLANEOUS) ×2 IMPLANT
PENCIL BUTTON HOLSTER BLD 10FT (ELECTRODE) ×2 IMPLANT
SPONGE GAUZE 4X4 12PLY (GAUZE/BANDAGES/DRESSINGS) ×2 IMPLANT
SPONGE INTESTINAL PEANUT (DISPOSABLE) ×2 IMPLANT
SPONGE LAP 18X18 X RAY DECT (DISPOSABLE) ×4 IMPLANT
STAPLER VISISTAT 35W (STAPLE) ×2 IMPLANT
SUT MNCRL AB 4-0 PS2 18 (SUTURE) ×4 IMPLANT
SUT PROLENE 2 0 CT2 30 (SUTURE) ×20 IMPLANT
SUT SILK 2 0 (SUTURE) ×2
SUT SILK 2 0 SH (SUTURE) ×2 IMPLANT
SUT SILK 2-0 18XBRD TIE 12 (SUTURE) ×2 IMPLANT
SUT VIC AB 2-0 CT1 27 (SUTURE) ×5
SUT VIC AB 2-0 CT1 TAPERPNT 27 (SUTURE) ×5 IMPLANT
SUT VIC AB 3-0 SH 27 (SUTURE) ×2
SUT VIC AB 3-0 SH 27XBRD (SUTURE) ×2 IMPLANT
SUT VICRYL AB 2 0 TIES (SUTURE) ×2 IMPLANT
SYR BULB 3OZ (MISCELLANEOUS) ×2 IMPLANT
SYR CONTROL 10ML LL (SYRINGE) ×2 IMPLANT
TAPE CLOTH SURG 4X10 WHT LF (GAUZE/BANDAGES/DRESSINGS) ×2 IMPLANT
TOWEL OR 17X24 6PK STRL BLUE (TOWEL DISPOSABLE) ×2 IMPLANT
TOWEL OR 17X26 10 PK STRL BLUE (TOWEL DISPOSABLE) ×2 IMPLANT
TUBE CONNECTING 12X1/4 (SUCTIONS) ×2 IMPLANT
YANKAUER SUCT BULB TIP NO VENT (SUCTIONS) ×2 IMPLANT

## 2013-05-22 NOTE — Progress Notes (Signed)
Late entry:  After receiving patient into phrase II, we were informed by wife that Dr. Dalbert Batman had said her husband was suppose to stay overnight.  I did call Dr. Dalbert Batman to get clarification--order to stay has been located.  Request placed into bed control.   Wife has since left and gone home.  I am still waiting for bed assignment.   Bilateral groin dressings look good.  Ice packs to those areas continue.  I have also started IVF of NS @ 75cc/hr. Cassandria Santee  RN

## 2013-05-22 NOTE — Preoperative (Signed)
Beta Blockers   Reason not to administer Beta Blockers:Not Applicable 

## 2013-05-22 NOTE — Anesthesia Postprocedure Evaluation (Signed)
  Anesthesia Post-op Note  Patient: Stephen Burch  Procedure(s) Performed: Procedure(s): REPAIR OF RECURRENT INCARCERATED INGUINAL HERNIA WITH MESH RIGHT SIDE,  REPAIR OF RECURRENT INGUINAL HERNIA WITH MESH LEFT SIDE (Bilateral) INSERTION OF MESH (Bilateral)  Patient Location: PACU  Anesthesia Type:General  Level of Consciousness: awake  Airway and Oxygen Therapy: Patient Spontanous Breathing  Post-op Pain: mild  Post-op Assessment: Post-op Vital signs reviewed  Post-op Vital Signs: Reviewed  Complications: No apparent anesthesia complications

## 2013-05-22 NOTE — Progress Notes (Signed)
Report given to sookie rn for lunch relief

## 2013-05-22 NOTE — Progress Notes (Signed)
Awaiting phase 2 spot to open

## 2013-05-22 NOTE — Interval H&P Note (Signed)
History and Physical Interval Note:  05/22/2013 6:29 AM  Stephen Burch  has presented today for surgery, with the diagnosis of BILATERAL INGUINAL HERNIA   The goals and the various methods of treatment have been discussed with the patient and family. He is aware of increased risk of bleeding due to portal hypertension and mild pancytopenia and increased risk of injury to adjacent organs such as testicle and intestine. After consideration of risks, benefits and other options for treatment, the patient has consented to  Procedure(s): HERNIA REPAIR INGUINAL ADULT, RECURRENT, BILATERAL (Bilateral) INSERTION OF MESH (Bilateral) as a surgical intervention .  The patient's history has been reviewed, patient examined today , no change in status, stable for surgery.  I have reviewed the patient's chart and labs.  Questions were answered to the patient's satisfaction.     [name]

## 2013-05-22 NOTE — Anesthesia Preprocedure Evaluation (Signed)
Anesthesia Evaluation  Patient identified by MRN, date of birth, ID band Patient awake    Reviewed: Allergy & Precautions  Airway Mallampati: II      Dental   Pulmonary asthma , former smoker,          Cardiovascular negative cardio ROS      Neuro/Psych    GI/Hepatic negative GI ROS, (+) Cirrhosis -       ,   Endo/Other    Renal/GU Renal disease     Musculoskeletal   Abdominal   Peds  Hematology   Anesthesia Other Findings   Reproductive/Obstetrics                           Anesthesia Physical Anesthesia Plan  ASA: III  Anesthesia Plan: General   Post-op Pain Management:    Induction: Intravenous  Airway Management Planned: Oral ETT  Additional Equipment:   Intra-op Plan:   Post-operative Plan: Extubation in OR  Informed Consent: I have reviewed the patients History and Physical, chart, labs and discussed the procedure including the risks, benefits and alternatives for the proposed anesthesia with the patient or authorized representative who has indicated his/her understanding and acceptance.   Dental advisory given  Plan Discussed with: CRNA, Anesthesiologist and Surgeon  Anesthesia Plan Comments:         Anesthesia Quick Evaluation

## 2013-05-22 NOTE — Transfer of Care (Signed)
Immediate Anesthesia Transfer of Care Note  Patient: Stephen Burch  Procedure(s) Performed: Procedure(s): REPAIR OF RECURRENT INCARCERATED INGUINAL HERNIA WITH MESH RIGHT SIDE,  REPAIR OF RECURRENT INGUINAL HERNIA WITH MESH LEFT SIDE (Bilateral) INSERTION OF MESH (Bilateral)  Patient Location: PACU  Anesthesia Type:General  Level of Consciousness: awake, alert  and oriented  Airway & Oxygen Therapy: Patient Spontanous Breathing and Patient connected to nasal cannula oxygen  Post-op Assessment: Report given to PACU RN and Post -op Vital signs reviewed and stable  Post vital signs: Reviewed and stable  Complications: No apparent anesthesia complications

## 2013-05-22 NOTE — Op Note (Signed)
Patient Name:           Stephen Burch   Date of Surgery:        05/22/2013  Pre op Diagnosis:      Recurrent bilateral inguinal hernias  Post op Diagnosis:    Recurrent, incarcerated right inguinal hernia Recurrent left inguinal hernia  Procedure:                 Open repair of recurrent, incarcerated right inguinal hernia with mesh, open repair recurrent left inguinal hernia with mesh.  Surgeon:                     Stephen Burch. Stephen Burch, M.D., FACS  Assistant:                      none  Operative Indications:   Stephen Burch is a 57 y.o. male. He is referred  to me by Dr. Dereck Burch for consideration of repair of his bilateral, recurrent inguinal hernias. Dr. Darryll Burch is his urologist. Stephen Burch is his PCP.  I previously evaluated this patient on 08/01/2012.he has undergone extensive medical evaluation since I last saw him. He has seen his urologist, diagnosed with BPH and started him Flomax. This has helped some but he still has urinary frequency. Upper endoscopy showed one tiny varix and changes of Barrett's esophagitis. Esophageal biopsy shows Barrett's esophagus but no dysplasia. A liver biopsy showed benign liver with steatosis special stains were negative. Dr. Dereck Burch called me and informed me that the patient was desiring hernia repair and that he was stable from a GI standpoint. He stated that he has nonalcoholic, noninfectious cirrhosis, probably secondary to fatty liver. He has splenomegaly and mild pancytopenia, portal hypertension but no ascites.  The patient states that he notices a bulge and some pain in his right groin. Less pain on the left. No nausea vomiting or abdominal pain.  .  The patient had bilateral inguinal hernia repairs 39 years ago by Dr. Magdalene Burch. Mesh was not used. In 1999 he underwent repair of recurrent Right inguinal hernia(per patient) with mesh and repair left varicocele by Dr. Edrick Burch in [place].(we have the op note)   CT scan was performed which  shows a bilateral inguinal hernias, right a little bit larger than left, and the bladder appears to be imaging up into the right inguinal hernia. Subtle cirrhosis was suspected. Portal hypertension with venous collaterals was documented. Marked splenomegaly was noted. No ascites. . There was one tiny gallstone. There was a left renal calculus   Operative Findings:       On the right side he had a recurrent hernia presenting just lateral to the pubic tubercle. This was a large lemon size incarcerated mass. I do not know if the bladder was involved in this. This was simply dissected free and reduced. The left side he had a recurrent indirect hernia. Both sides were repaired with mesh.  Procedure in Detail:          Following the induction of general endotracheal anesthesia the abdomen and genitalia were prepped and draped in a sterile fashion. Intravenous antibiotics were given and a surgical time out for. 0.5 Marcaine with epinephrine was used as local infiltration anesthetic.  I incised the right groin through the old scar. I dissected down through the scar tissue until I identified a hernia bulge medially and the external oblique laterally. I then  slowly dissected through  very intense scar  tissue, dissecting  it off around the cord structures and I encircled them with a Penrose drain. I then slowly dissected around the large hernia sac medial to the cord. Ultimately freed this up but I had to divide the lacunar ligament medially to make enough space to reduce the hernia. I did reduce the bulge. I then took a polypropylene mesh and fashioned a small plug and inserted that into the space and sutured  around the rim of the defect with interrupted 2-0 Prolene. This held the reduction in place quite nicely. I checked the cord and there was no evidence of indirect hernia. A small lipoma was  debrided. The floor of the inguinal canal was repaired and reinforced with an onlay graft of ultra Pro mesh. A 3" x 6"  piece of mesh was brought to the operative field and trimmed at the corners to accommodate the anatomy of the wound. This was sutured in place with interrupted mattress sutures of 2-0 Prolene. The mesh was sutured so as to generously overlap the fascia at the pubic tubercle. Then along the inguinal ligament inferiorly. I placed  mattress sutures medially, superiorly, and  laterally. The mesh was incised laterally stow so as to wrap around the cord structures. The tails of the mesh were overlapped laterally and further sutures were placed. This provided very secure repair both medial and lateral to the internal ring and allowed adequate fingertip opening for the cord structures. The wound was irrigated with saline. There was no bleeding. The external oblique was closed with a running 2-0 Vicryl suture;  Scarpa's fascia was closed with a running 2-0 Vicryl suture. The skin was closed with skin staples.  I then made a mirror image incision in the left groin, also through an old scar.Dissection was carried down through the subcutaneous tissue. I identified the external oblique laterally. I dissected medially. The scarring was quite intense chronically. I could palpate the pubic tubercle. I slowly identified the external inguinal ring. I got under that and slowly opened it up having to dissect each step. I eventually opened this up beyond the internal ring laterally. I mobilized the cord structures encircled them with a Penrose drain. I dissected the external oblique superiorly off of the underlying muscle layer dissected inferiorly  to identify the inguinal ligament. I found an indirect hernia sac that was mostly a lipoma. This was debrided and resected. The internal ring was then closed with interrupted 2-0 Vicryl sutures to tighten it down. I searched for other hernias and found no evidence of inguinal or femoral hernia. Wound  was irrigated with saline.  A 3" x 6" piece of Ultra-Pro mesh was  Brought to the  operative field, trimmed it at the corners and sutured in place with interrupted mattress sutures of 2-0 Prolene, essentially identical to the repair on the right. This provided very secure repair both medial and lateral to the internal ring and  allowed an adequate opening for the cord structures. There was no bleeding. I irrigated the wound. The external oblique was closed with running 2-0 Vicryl suture. Subcutaneous tissue was closed with running 3-0 Vicryl suture the skin closed with skin staples. Clean bandages were placed and the patient taken to recovery room in stable condition. EBL 20 cc. Counts correct. Complications none.     Stephen Burch. Stephen Burch, M.D., FACS General and Minimally Invasive Surgery Breast and Colorectal Surgery  05/22/2013 9:39 AM

## 2013-05-23 ENCOUNTER — Encounter (HOSPITAL_COMMUNITY): Payer: Self-pay | Admitting: Emergency Medicine

## 2013-05-23 DIAGNOSIS — J45909 Unspecified asthma, uncomplicated: Secondary | ICD-10-CM | POA: Insufficient documentation

## 2013-05-23 DIAGNOSIS — Y838 Other surgical procedures as the cause of abnormal reaction of the patient, or of later complication, without mention of misadventure at the time of the procedure: Secondary | ICD-10-CM | POA: Insufficient documentation

## 2013-05-23 DIAGNOSIS — Z87442 Personal history of urinary calculi: Secondary | ICD-10-CM | POA: Insufficient documentation

## 2013-05-23 DIAGNOSIS — Z79899 Other long term (current) drug therapy: Secondary | ICD-10-CM | POA: Insufficient documentation

## 2013-05-23 DIAGNOSIS — IMO0002 Reserved for concepts with insufficient information to code with codable children: Secondary | ICD-10-CM | POA: Insufficient documentation

## 2013-05-23 DIAGNOSIS — Z87891 Personal history of nicotine dependence: Secondary | ICD-10-CM | POA: Insufficient documentation

## 2013-05-23 DIAGNOSIS — Z8719 Personal history of other diseases of the digestive system: Secondary | ICD-10-CM | POA: Insufficient documentation

## 2013-05-23 DIAGNOSIS — Z880 Allergy status to penicillin: Secondary | ICD-10-CM | POA: Insufficient documentation

## 2013-05-23 MED ORDER — ACETAMINOPHEN 325 MG PO TABS: 650.0000 mg | ORAL_TABLET | ORAL | Status: DC | PRN

## 2013-05-23 MED ORDER — OXYCODONE HCL 5 MG PO TABS: 5.0000 mg | ORAL_TABLET | ORAL | Status: DC | PRN

## 2013-05-23 NOTE — Discharge Summary (Signed)
Physician Discharge Summary  Stephen Burch UEA:[id number] DOB: 09/06/1955 DOA: 05/22/2013  PCP: Clydell Hakim, MD  Consultation: none  Admit date: 05/22/2013 Discharge date: 05/23/2013  Recommendations for Outpatient Follow-up:    Follow-up Information   Follow up with Ernestene Mention, MD. Schedule an appointment as soon as possible for a visit in 10 days.   Specialty:  General Surgery   Contact information:   [address] [place] [address] [phone]      Discharge Diagnoses:  1. Recurrent, incarcerated right inguinal hernia 2. Recurrent left inguinal hernia   Surgical Procedure: open repair of recurrent, incarcerated right inguinal hernia with mesh, open repair of left inguinal hernia with mesh(05/22/13 Dr. Derrell Lolling)  Discharge Condition: stable Disposition: home  Diet recommendation: high fiber  Filed Weights   05/22/13 1539  Weight: 210 lb 12.8 oz (95.618 kg)     Filed Vitals:   05/23/13 1005  BP: 119/80  Pulse: 85  Temp: 98.8 F (37.1 C)  Resp: 19     Hospital Course:  Stephen Burch underwent a scheduled bilateral hernia repair yesterday.  He tolerated the procedure well and was kept overnight to ensure he remains stable.  His vital signs were stable.  He was mobilized.  On POD #1 he was tolerating a diet, passing flatus, voiding and pain was well controlled with oral pain medication.  He was therefore felt stable for discharge.  His incisions looked excellent. No bleeding or erythema.  We discussed home care.  We discussed warning signs that warrant immediate attention.  We discussed side effects of pain medication.  He knows to call Dr. Jacinto Halim office on Monday to scheduled a follow up in 10 days.     General appearance: alert and oriented. Calm and cooperative No acute distress. VSS. Afebrile.  Resp: clear to auscultation bilaterally  Cardio: S1S1 RRR without murmurs or gallops. No edema. GI: soft round and nontender. +BS x4  quadrants. No organomegaly, hernias or masses. Bilateral inguinal incisions are without drainage, staples are intact, without erythema.      Discharge Instructions   Future Appointments Provider Department Dept Phone   06/04/2013 10:30 AM Ernestene Mention, MD [facility], [place] [phone]   06/29/2013 9:00 AM Malissa Hippo, MD [facility] [phone]       Medication List         acetaminophen 325 MG tablet  Commonly known as:  TYLENOL  Take 2 tablets (650 mg total) by mouth every 4 (four) hours as needed for mild pain (or Fever >/= 101).     oxyCODONE 5 MG immediate release tablet  Commonly known as:  Oxy IR/ROXICODONE  Take 1-2 tablets (5-10 mg total) by mouth every 4 (four) hours as needed for moderate pain.     pantoprazole 40 MG tablet  Commonly known as:  PROTONIX  Take 40 mg by mouth daily.     tamsulosin 0.4 MG Caps capsule  Commonly known as:  FLOMAX  Take 0.4 mg by mouth daily after supper.     vitamin C 500 MG tablet  Commonly known as:  ASCORBIC ACID  Take 1 tablet (500 mg total) by mouth daily.           Follow-up Information   Follow up with Ernestene Mention, MD. Schedule an appointment as soon as possible for a visit in 10 days.   Specialty:  General Surgery   Contact information:   [address]  [place] [address] [phone]        The results of significant diagnostics from this hospitalization (including imaging, microbiology, ancillary and laboratory) are listed below for reference.     Principal Problem:   Bilateral inguinal hernia   Signed:  Larence Thone, ANP-BC

## 2013-05-23 NOTE — Discharge Summary (Signed)
ATTENDING ADDENDUM:  I personally reviewed patient's record, examined the patient, and formulated the following assessment and plan:  Stable for discharge.

## 2013-05-23 NOTE — ED Notes (Signed)
Pt had left inguinal hernia repair on Friday 05/22/2013 and now has swelling bruising around scrotum

## 2013-05-24 ENCOUNTER — Emergency Department (HOSPITAL_COMMUNITY)
Admission: EM | Admit: 2013-05-24 | Discharge: 2013-05-24 | Disposition: A | Discharge status: Home / Self Care | Payer: Medicare Other | Attending: Emergency Medicine | Admitting: Emergency Medicine

## 2013-05-24 DIAGNOSIS — T888XXA Other specified complications of surgical and medical care, not elsewhere classified, initial encounter: Secondary | ICD-10-CM

## 2013-05-24 NOTE — ED Provider Notes (Signed)
CSN: [id number]     Arrival date & time 05/23/13  2221 History   First MD Initiated Contact with Patient 05/24/13 0221     Chief Complaint  Patient presents with  . Groin Swelling   (Consider location/radiation/quality/duration/timing/severity/associated sxs/prior Treatment) HPI 57 year old male presents to emergency room with complaint of post operative bruising and swelling.  Patient had bilateral inguinal hernia repairs done yesterday.  He reports throughout the day today.  He has had increased swelling to his left incision site with bruising over his suprapubic region into his scrotum and penis.  He reports pain is controlled.  Swelling.  His discomfort, but not uncomfortable.  He has had no bleeding from his incision.  He was told by on-call surgeon that he could either wait to have it seen in the office or followup.  Tonight. Past Medical History  Diagnosis Date  . Kidney stones   . Cirrhosis   . Asthma     as a kid   Past Surgical History  Procedure Laterality Date  . Replacement total knee      Bilateral knee replacement, 2010, 2012  . Esophagogastroduodenoscopy N/A 09/17/2012    Procedure: ESOPHAGOGASTRODUODENOSCOPY (EGD);  Surgeon: Rogene Houston, MD;  Location: [facility];  Service: Endoscopy;  Laterality: N/A;  200  . Hernia repair Bilateral     x 2  . Tonsillectomy     Family History  Problem Relation Age of Onset  . Colon cancer Neg Hx   . Colon polyps Neg Hx    History  Substance Use Topics  . Smoking status: Former Smoker -- 0.25 packs/day for 3 years    Types: Cigarettes  . Smokeless tobacco: Never Used     Comment: none since age 34-1 pack per week when he was smoking   . Alcohol Use: No     Comment: rarely.  Last beer 6 months. Was not a heavy drinker.    Review of Systems  All other systems reviewed and are negative.    Allergies  Aspirin and Penicillins  Home Medications   Current Outpatient Rx  Name  Route  Sig  Dispense  Refill  .  oxyCODONE (OXY IR/ROXICODONE) 5 MG immediate release tablet   Oral   Take 1-2 tablets (5-10 mg total) by mouth every 4 (four) hours as needed for moderate pain.   30 tablet   0   . pantoprazole (PROTONIX) 40 MG tablet   Oral   Take 40 mg by mouth daily.          . tamsulosin (FLOMAX) 0.4 MG CAPS   Oral   Take 0.4 mg by mouth daily after supper.          . vitamin C (ASCORBIC ACID) 500 MG tablet   Oral   Take 1 tablet (500 mg total) by mouth daily.          BP 98/60  Pulse 68  Temp(Src) 98.3 F (36.8 C) (Oral)  Resp 18  Wt 208 lb 11.2 oz (94.666 kg)  SpO2 100% Physical Exam  Nursing note and vitals reviewed. Constitutional: He is oriented to person, place, and time. He appears well-developed and well-nourished. No distress.  HENT:  Head: Normocephalic and atraumatic.  Nose: Nose normal.  Mouth/Throat: Oropharynx is clear and moist.  Eyes: Conjunctivae and EOM are normal. Pupils are equal, round, and reactive to light.  Neck: Normal range of motion. Neck supple. No JVD present. No tracheal deviation present. No thyromegaly present.  Cardiovascular:  Normal rate, regular rhythm, normal heart sounds and intact distal pulses.  Exam reveals no gallop and no friction rub.   No murmur heard. Pulmonary/Chest: Effort normal and breath sounds normal. No stridor. No respiratory distress. He has no wheezes. He has no rales. He exhibits no tenderness.  Abdominal: Soft. Bowel sounds are normal. He exhibits no distension and no mass. There is no tenderness. There is no rebound and no guarding.  Bilateral incisions to groin.  Clean, dry and intact.  No signs of dehiscence.  No purulent drainage.  Left sided incision is slightly swollen.  There is bruising noted around the incision that extends into the suprapubic region and down into scrotum and penis.  There is no tenderness to palpation of the penis.  Scrotum is slightly tender  Musculoskeletal: Normal range of motion. He exhibits no  edema and no tenderness.  Lymphadenopathy:    He has no cervical adenopathy.  Neurological: He is alert and oriented to person, place, and time. He exhibits normal muscle tone. Coordination normal.  Skin: Skin is warm and dry. No rash noted. No erythema. No pallor.  Psychiatric: He has a normal mood and affect. His behavior is normal. Judgment and thought content normal.    ED Course  Procedures (including critical care time) Labs Review Labs Reviewed - No data to display Imaging Review No results found.  EKG Interpretation   None       MDM   1. Postoperative hematoma, initial encounter    57 year old male with bruising and swelling to scrotum and left inguinal hernia repair site.  Case was discussed with Dr. Hulen Skains, who reports this is a not uncommon occurrence after such surgeries.  Patient reassured and has good followup in place.   Kalman Drape, MD 05/25/13 [phone]

## 2013-05-24 NOTE — ED Notes (Addendum)
Pt stated that he had a hernia repair on Friday. Since then he has a hematoma. Groin area is significantly bruised. Pt stated that he also has intermittent swelling in scrotum. Pain is 4/5 out of 10. No cardiac or respiratory distress. Will continue to monitor.

## 2013-05-25 ENCOUNTER — Telehealth (INDEPENDENT_AMBULATORY_CARE_PROVIDER_SITE_OTHER): Payer: Self-pay | Admitting: *Deleted

## 2013-05-25 NOTE — Telephone Encounter (Signed)
Patient called this morning reporting that he has had increasing swelling and bruising.  Patient states he was seen on 05/24/13 in the ED due to this.  Explained to patient that the swelling and bruising are very normal for this type of surgery.  Patient denies fever, drainage, or signs of infection.  Patient instructed to use ice and a pillow under his lower back to try to help but explained it just takes quite a bit of time.  Patient states understanding and agreeable at this time.

## 2013-05-26 ENCOUNTER — Encounter (HOSPITAL_COMMUNITY): Payer: Self-pay | Admitting: General Surgery

## 2013-06-01 ENCOUNTER — Encounter (INDEPENDENT_AMBULATORY_CARE_PROVIDER_SITE_OTHER): Payer: Self-pay | Admitting: General Surgery

## 2013-06-01 ENCOUNTER — Encounter (INDEPENDENT_AMBULATORY_CARE_PROVIDER_SITE_OTHER): Payer: Self-pay

## 2013-06-01 ENCOUNTER — Ambulatory Visit (INDEPENDENT_AMBULATORY_CARE_PROVIDER_SITE_OTHER): Payer: Medicare Other | Admitting: General Surgery

## 2013-06-01 VITALS — BP 128/82 | HR 80 | Temp 97.6°F | Resp 16 | Ht 72.0 in | Wt 208.0 lb

## 2013-06-01 DIAGNOSIS — K4020 Bilateral inguinal hernia, without obstruction or gangrene, not specified as recurrent: Secondary | ICD-10-CM

## 2013-06-01 NOTE — Progress Notes (Signed)
Patient ID: Stephen Burch, male   DOB: [birth year], 57 y.o.   MRN: [id number] History: This gentleman underwent open repair of recurrent incarcerated right inguinal hernia with mesh and open repair of recurrent left anal hernia with mesh on 05/22/2013. His comorbidities include nonalcoholic, noninfectious cirrhosis, mild portal hypertension but no ascites, splenomegaly and mild pancytopenia. He has done well. He noticed a lot of swelling and bruising in his penis and scrotum and went to the emergency department on November 23. He now feels fine pieces the bruising has resolved. He hasn't ever had much pain and doesn't take much pain medicine. He is voiding normally. He is tolerating a diet, having normal bowel movements  Exam: Patient looks well. No distress Abdomen soft and nontender Genitalia revealed bilateral inguinal incisions healing normally. Normal amount of edema and thickening but no hematoma or infection. Small red dots around each staple, thought to be simply reaction to the staple material. Staples were removed and Steri-Strips are applied. Penis scrotum and testes looked normal. No hematoma or ecchymoses  Assessment: Bilateral recurrent inguinal hernias, incarcerated right. Recovering uneventfully following complex open repair with mesh bilaterally Nonalcoholic, noninfectious cirrhosis with portal hypertension Significant splenomegaly Mild pancytopenia  Plan: Diet & activities discussed. Lots of walking but no lifting more than 15 or 20 pounds or sports  for 6 weeks Return to see me in 6 weeks.   Stephen Petrin. Dalbert Batman, M.D., [facility] General and Minimally invasive Surgery Breast and Colorectal Surgery Office:   [phone] Pager:   [phone]

## 2013-06-01 NOTE — Patient Instructions (Signed)
Your bilateral inguinal hernia incisions are healing normally. A small amount of redness is most likely a reaction to the staples. There is no sign of any bleeding or infection at this time.  The staples were removed and Steri-Strip bandages were placed.  You may continue to shower but do not taken a tub bath  Take a couple of walks every day  No lifting more than 15 pounds for 6 weeks  Return to see Dr. Dalbert Batman in 6 weeks for a checkup

## 2013-06-04 ENCOUNTER — Encounter (INDEPENDENT_AMBULATORY_CARE_PROVIDER_SITE_OTHER): Payer: Medicare Other | Admitting: General Surgery

## 2013-06-05 ENCOUNTER — Encounter (INDEPENDENT_AMBULATORY_CARE_PROVIDER_SITE_OTHER): Payer: Medicare Other | Admitting: General Surgery

## 2013-06-08 ENCOUNTER — Telehealth (INDEPENDENT_AMBULATORY_CARE_PROVIDER_SITE_OTHER): Payer: Self-pay

## 2013-06-08 NOTE — Telephone Encounter (Signed)
Patient called in stating part of his incision has opened up. States the area may be about an inch in length that opened up. Denies any drainage. I advised him to keep area clean and covered with dry guaze. Watch area and if he starts to have any drainage or more of the incision opens up to call our office and let us know. We will keep his follow up appointment where it is now.

## 2013-06-09 ENCOUNTER — Ambulatory Visit (INDEPENDENT_AMBULATORY_CARE_PROVIDER_SITE_OTHER): Payer: Medicare Other | Admitting: Internal Medicine

## 2013-06-09 ENCOUNTER — Encounter (INDEPENDENT_AMBULATORY_CARE_PROVIDER_SITE_OTHER): Payer: Self-pay | Admitting: Internal Medicine

## 2013-06-09 VITALS — BP 110/74 | HR 76 | Temp 98.1°F | Resp 18 | Ht 72.0 in | Wt 212.9 lb

## 2013-06-09 DIAGNOSIS — D696 Thrombocytopenia, unspecified: Secondary | ICD-10-CM

## 2013-06-09 DIAGNOSIS — K7460 Unspecified cirrhosis of liver: Secondary | ICD-10-CM

## 2013-06-09 NOTE — Patient Instructions (Signed)
Resume regular exercise and physical activity when cleared by Dr. Fanny Skates.

## 2013-06-09 NOTE — Progress Notes (Signed)
Presenting complaint;  Followup for cirrhosis secondary to NAFLD.  Subjective:  Stephen Burch is a 57 year old Caucasian male who has cirrhosis secondary to NAFLD complicated by thrombocytopenia. He presents for scheduled visit. He was last seen on 02/23/2013 for abdominal bloating. He underwent bilateral inguinal herniorrhaphy on 05/22/2013 by Dr. Fanny Skates and has done well. He did not require platelet transfusion. He is still having soreness at surgical site but he is ambulating without difficulty. He has not yet been cleared to exercise. He is not bloated anymore. He has very good appetite. He has gained about 4 pounds since his last visit. His bowels move daily. He denies melena or rectal bleeding. Heartburn is well controlled with PPI. He states he completed a vaccination for hepatitis A and B. through [employer]. He tells me his father died of variceal bleed at age 69. No other member in his family has chronic liver disease.  Current Medications: Current Outpatient Prescriptions  Medication Sig Dispense Refill  . pantoprazole (PROTONIX) 40 MG tablet Take 40 mg by mouth daily.       . vitamin C (ASCORBIC ACID) 500 MG tablet Take 1 tablet (500 mg total) by mouth daily.       No current facility-administered medications for this visit.     Objective: Blood pressure 110/74, pulse 76, temperature 98.1 F (36.7 C), temperature source Oral, resp. rate 18, height 6' (1.829 m), weight 212 lb 14.4 oz (96.571 kg). Patient is alert and does not have asterixis. Conjunctiva is pink. Sclera is nonicteric Oropharyngeal mucosa is normal. No neck masses or thyromegaly noted. Cardiac exam with regular rhythm normal S1 and S2. No murmur or gallop noted. Lungs are clear to auscultation. Abdomen. He has bilateral inguinal surgical scars. Abdomen is soft and nontender with palpable spleen isn't palpable liver edge. Shifting dullness is absent. No LE edema or clubbing  noted.  Labs/studies Results: CBC from 05/13/2013 revealed WBC of 4.8, H&H of 16.0 and 45.8 and platelet count of 121K.    Assessment:  #1. Cirrhosis secondary to NAFLD. Based on imaging studies he would appear to have stage III or 4 fibrosis. He had liver biopsy in April 2014 which revealed periportal and perisinusoidal fibrosis but this would appear to be sampling error. EGD in March 2014 revealed portal gastropathy and single small esophageal varix. He has received hepatitis A and B. Vaccination. #2. GERD complicated by short segment Barrett's esophagus. Esophageal biopsy was in March 2014. Symptoms are well controlled with therapy.    Plan:  Patient will resume regular exercise when cleared by Dr. Fanny Skates.  I would like to see his weight down to below 200 pounds by the time of his next office visit. Office visit in 6 months.

## 2013-06-29 ENCOUNTER — Ambulatory Visit (INDEPENDENT_AMBULATORY_CARE_PROVIDER_SITE_OTHER): Payer: Medicare Other | Admitting: Internal Medicine

## 2013-07-13 ENCOUNTER — Encounter (INDEPENDENT_AMBULATORY_CARE_PROVIDER_SITE_OTHER): Payer: Self-pay

## 2013-07-13 ENCOUNTER — Ambulatory Visit (INDEPENDENT_AMBULATORY_CARE_PROVIDER_SITE_OTHER): Payer: Medicare Other | Admitting: General Surgery

## 2013-07-13 ENCOUNTER — Encounter (INDEPENDENT_AMBULATORY_CARE_PROVIDER_SITE_OTHER): Payer: Self-pay | Admitting: General Surgery

## 2013-07-13 VITALS — BP 118/80 | HR 96 | Temp 97.7°F | Resp 18 | Ht 72.0 in | Wt 219.5 lb

## 2013-07-13 DIAGNOSIS — K4020 Bilateral inguinal hernia, without obstruction or gangrene, not specified as recurrent: Secondary | ICD-10-CM

## 2013-07-13 DIAGNOSIS — L02214 Cutaneous abscess of groin: Secondary | ICD-10-CM

## 2013-07-13 DIAGNOSIS — L03319 Cellulitis of trunk, unspecified: Secondary | ICD-10-CM

## 2013-07-13 DIAGNOSIS — L02219 Cutaneous abscess of trunk, unspecified: Secondary | ICD-10-CM

## 2013-07-13 NOTE — Patient Instructions (Signed)
Your bilateral inguinal hernia surgery appears to have healed well. There is no evidence of hernia.  You developed a small abscess in your right groin 3 days ago. We drained this in the office today.  Take the doxycycline antibiotic for the next 2 weeks, and the infection should resolve.  You may return to work without restriction any time after January 19  Return to see Dr. Dalbert Batman if further problems arise.

## 2013-07-13 NOTE — Progress Notes (Signed)
Patient ID: Stephen Burch, male   DOB: 25-Aug-1955, 58 y.o.   MRN: [id number]  History:  This gentleman underwent open repair of recurrent incarcerated right inguinal hernia with mesh and open repair of recurrent left inguinal hernia with mesh on 05/22/2013. His comorbidities include nonalcoholic, noninfectious cirrhosis, mild portal hypertension but no ascites, splenomegaly and mild pancytopenia.  He states that the hernia repairs have healed well. He did develop a painful bulla laterally in his right groin 2 or 3 days ago. He has had these on his back before.  Past history, family history, social history, and review of systems are documented on the chart, unchanged, and noncontributory except as described above.  Exam:  Patient looks well. No distress  Abdomen soft and nontender  Genitalia revealed bilateral inguinal incisions The hernia repairs are intact. On the lateral aspect of the right groin, below the incision is a small eschar and some erythema. This is tender and indurated but not fluctuant. I removed the eschar and got one drop of purulent fluid out but there really wasn't anything else to drain. This was dressed with dry clean gauze. Penis scrotum and testes are normal.  Assessment:  Bilateral recurrent inguinal hernias, incarcerated right.No evidence of recurrence postop Soft tissue infection right groin. Suspect folliculitis. Unroofed and drained in office today. Suspect staph or strep Nonalcoholic, noninfectious cirrhosis with portal hypertension  Significant splenomegaly  Mild pancytopenia   Plan:  Right groin wound care discussed. Twice a day section shower Doxycycline 100 mg twice a day x14 days. Prescription given the patient Return to work in one week Return to see me if further problems arise or infection that probably resolve.    [name]. Dalbert Batman, M.D., [facility]  General and Minimally invasive Surgery  Breast and Colorectal Surgery   Office: [phone]  Pager: [phone]

## 2013-10-07 LAB — CBC AND DIFFERENTIAL
Hemoglobin: 16.3 g/dL (ref 13.5–17.5)
WBC: 3.8 10^3/mL

## 2013-11-10 ENCOUNTER — Telehealth (INDEPENDENT_AMBULATORY_CARE_PROVIDER_SITE_OTHER): Payer: Self-pay | Admitting: Internal Medicine

## 2013-11-10 DIAGNOSIS — K219 Gastro-esophageal reflux disease without esophagitis: Secondary | ICD-10-CM

## 2013-11-10 MED ORDER — PANTOPRAZOLE SODIUM 40 MG PO TBEC: 40.0000 mg | DELAYED_RELEASE_TABLET | Freq: Every day | ORAL | Status: DC

## 2013-11-10 NOTE — Telephone Encounter (Signed)
rx refilled.

## 2013-12-08 ENCOUNTER — Encounter (INDEPENDENT_AMBULATORY_CARE_PROVIDER_SITE_OTHER): Payer: Self-pay | Admitting: Internal Medicine

## 2013-12-08 ENCOUNTER — Encounter (INDEPENDENT_AMBULATORY_CARE_PROVIDER_SITE_OTHER): Payer: Self-pay | Admitting: *Deleted

## 2013-12-08 ENCOUNTER — Ambulatory Visit (INDEPENDENT_AMBULATORY_CARE_PROVIDER_SITE_OTHER): Payer: Medicare Other | Admitting: Internal Medicine

## 2013-12-08 VITALS — BP 110/70 | HR 74 | Temp 97.3°F | Resp 18 | Wt 213.3 lb

## 2013-12-08 DIAGNOSIS — K7460 Unspecified cirrhosis of liver: Secondary | ICD-10-CM

## 2013-12-08 DIAGNOSIS — K219 Gastro-esophageal reflux disease without esophagitis: Secondary | ICD-10-CM

## 2013-12-08 DIAGNOSIS — M7120 Synovial cyst of popliteal space [Baker], unspecified knee: Secondary | ICD-10-CM | POA: Insufficient documentation

## 2013-12-08 NOTE — Progress Notes (Signed)
Presenting complaint;  Followup for chronic liver disease and GERD.  Subjective:  Patient is 58 year old Caucasian male who has cirrhosis secondary to NAFLD and chronic GERD complicated by short segment Barrett's esophagus who is here for scheduled visit. He was last seen in December 2014. He has no complaints. He is watching his diet. He believes he is cut back on his calories. He is eating more grilled foods and drinks one cola a day. Heartburn is well-controlled with therapy. He occasionally has to take TUMS. He is drinking 2 cups of coffee per day. He walks 6-8 miles every week and he also goes to gym at [employer] at [employer] at least 3 times a week. He denies abdominal pain or fatigue.       Current Medications: Outpatient Encounter Prescriptions as of 12/08/2013  Medication Sig  . Milk Thistle 1000 MG CAPS Take 1,000 mg by mouth daily.  . pantoprazole (PROTONIX) 40 MG tablet Take 1 tablet (40 mg total) by mouth daily.  . vitamin C (ASCORBIC ACID) 500 MG tablet Take 1 tablet (500 mg total) by mouth daily.     Objective: Blood pressure 110/70, pulse 74, temperature 97.3 F (36.3 C), temperature source Oral, resp. rate 18, weight 213 lb 4.8 oz (96.752 kg). Patient is alert and does not have asterixis. Conjunctiva is pink. Sclera is nonicteric Oropharyngeal mucosa is normal. No neck masses or thyromegaly noted. Cardiac exam with regular rhythm normal S1 and S2. No murmur or gallop noted. Lungs are clear to auscultation. Abdomen is full. It is soft and nontender with palpable spleen. Liver edges is indistinct below RCM but does not appear to be enlarged. No LE edema or clubbing noted.  Labs/studies Results: Lab data from 10/07/2013  WBC 3.8, H&H 16.3 and 46.8 and platelet count 118 K.  INR 1.0    Assessment:  #1. Cirrhosis secondary to NAFLD. Patient has multiple stigmata to suggest cirrhosis he would do liver biopsy in April last year showed stage  II disease. EGD in March last year revealed grade 1 esophageal varices portal gastropathy and he has thrombocytopenia and splenomegaly. He has compensated disease. He has received hepatitis A and B. Vaccination. He is due for [facility] screening. #2. GERD. GERD complicated by short segment Barrett's esophagus. He is doing well with therapy. #3. Obesity. He is working hard to lose weight. If current approach is not working we'll consider dietary consultation at the time of next visit.    Plan:  Patient will go to the lab for AFP and LFTs. Hepatic ultrasound for [place] screening. Office visit in 6 months.

## 2013-12-08 NOTE — Patient Instructions (Signed)
Physician will call with results of ultrasound when completed. Will request a copy of blood work from your primary care physician.

## 2013-12-09 LAB — HEPATIC FUNCTION PANEL
ALBUMIN: 4.4 g/dL (ref 3.5–5.2)
ALT: 48 U/L (ref 0–53)
AST: 38 U/L — ABNORMAL HIGH (ref 0–37)
Alkaline Phosphatase: 185 U/L — ABNORMAL HIGH (ref 39–117)
Bilirubin, Direct: 0.3 mg/dL (ref 0.0–0.3)
Indirect Bilirubin: 1.2 mg/dL (ref 0.2–1.2)
TOTAL PROTEIN: 6.8 g/dL (ref 6.0–8.3)
Total Bilirubin: 1.5 mg/dL — ABNORMAL HIGH (ref 0.2–1.2)

## 2013-12-09 LAB — AFP TUMOR MARKER: AFP-Tumor Marker: 2.7 ng/mL (ref 0.0–8.0)

## 2013-12-11 ENCOUNTER — Ambulatory Visit (HOSPITAL_COMMUNITY)
Admission: RE | Admit: 2013-12-11 | Discharge: 2013-12-11 | Disposition: A | Discharge status: Home / Self Care | Payer: Medicare Other | Source: Ambulatory Visit | Attending: Internal Medicine | Admitting: Internal Medicine

## 2013-12-11 DIAGNOSIS — R161 Splenomegaly, not elsewhere classified: Secondary | ICD-10-CM | POA: Insufficient documentation

## 2013-12-11 DIAGNOSIS — K7460 Unspecified cirrhosis of liver: Secondary | ICD-10-CM | POA: Insufficient documentation

## 2014-01-04 ENCOUNTER — Telehealth (INDEPENDENT_AMBULATORY_CARE_PROVIDER_SITE_OTHER): Payer: Self-pay | Admitting: *Deleted

## 2014-01-04 NOTE — Telephone Encounter (Signed)
Stephen Burch has a rash more to the right side of his stomach above the pants live. His PCP and dermatologist gave him Flutlicasome Cream also Flucocinonide Cream which has had no effect on it. He was also give Doxycyclien Hycolate that has not helped either. Would like to know if his Cirrhosis could cause a rash? The return phone number is [phone].

## 2014-01-04 NOTE — Telephone Encounter (Signed)
Per Dr.Rehman - Does not feel that this rash is related to his Cirrhosis. Suggest that if the patient is not responding,he should follow up with his PCP /Dermatolgist. They may want to do a bx to determine the rash. Patient will be called 01/05/14.

## 2014-01-05 NOTE — Telephone Encounter (Signed)
Patient was called and a message with Dr.Rehman's recommendation was left on his voicemail. Patient was ask to call the office if he had any further questions and or concerns.

## 2014-01-27 ENCOUNTER — Other Ambulatory Visit: Payer: Self-pay | Admitting: Dermatology

## 2014-04-01 ENCOUNTER — Encounter (INDEPENDENT_AMBULATORY_CARE_PROVIDER_SITE_OTHER): Payer: Self-pay | Admitting: *Deleted

## 2014-04-06 ENCOUNTER — Encounter (INDEPENDENT_AMBULATORY_CARE_PROVIDER_SITE_OTHER): Payer: Self-pay | Admitting: Internal Medicine

## 2014-04-06 ENCOUNTER — Ambulatory Visit (INDEPENDENT_AMBULATORY_CARE_PROVIDER_SITE_OTHER): Payer: Medicare Other | Admitting: Internal Medicine

## 2014-04-06 ENCOUNTER — Encounter (INDEPENDENT_AMBULATORY_CARE_PROVIDER_SITE_OTHER): Payer: Self-pay | Admitting: *Deleted

## 2014-04-06 VITALS — BP 110/64 | HR 72 | Temp 97.4°F | Ht 72.0 in | Wt 216.2 lb

## 2014-04-06 DIAGNOSIS — R140 Abdominal distension (gaseous): Secondary | ICD-10-CM

## 2014-04-06 DIAGNOSIS — K7469 Other cirrhosis of liver: Secondary | ICD-10-CM

## 2014-04-06 LAB — CBC WITH DIFFERENTIAL/PLATELET
BASOS PCT: 0 % (ref 0–1)
Basophils Absolute: 0 10*3/uL (ref 0.0–0.1)
Eosinophils Absolute: 0.3 10*3/uL (ref 0.0–0.7)
Eosinophils Relative: 6 % — ABNORMAL HIGH (ref 0–5)
HCT: 46 % (ref 39.0–52.0)
HEMOGLOBIN: 16.1 g/dL (ref 13.0–17.0)
Lymphocytes Relative: 19 % (ref 12–46)
Lymphs Abs: 0.9 10*3/uL (ref 0.7–4.0)
MCH: 30.6 pg (ref 26.0–34.0)
MCHC: 35 g/dL (ref 30.0–36.0)
MCV: 87.3 fL (ref 78.0–100.0)
Monocytes Absolute: 0.4 10*3/uL (ref 0.1–1.0)
Monocytes Relative: 8 % (ref 3–12)
NEUTROS ABS: 3 10*3/uL (ref 1.7–7.7)
NEUTROS PCT: 67 % (ref 43–77)
Platelets: 112 10*3/uL — ABNORMAL LOW (ref 150–400)
RBC: 5.27 MIL/uL (ref 4.22–5.81)
RDW: 13.8 % (ref 11.5–15.5)
WBC: 4.5 10*3/uL (ref 4.0–10.5)

## 2014-04-06 NOTE — Patient Instructions (Signed)
Keep OV in December

## 2014-04-06 NOTE — Progress Notes (Signed)
Subjective:    Patient ID: Stephen Burch, male    DOB: Jun 25, 1956, 58 y.o.   MRN: [id number]  HPI Here today with c/o bloating. Bloats especially after eating. Abdomen does go down in the evening. Hx of NAFLD and GERD. Very little acid reflux.   Appetite is good. He has gained 3 pounds since his last visit.  Usually has a BM daily. No melena or BRRB. He is exercising at the [facility] He has received Hep A and B vaccine  Family hx of non-alcoholic fatty liver disease (father)  Father deceased from an esophageal varices bleed in his 72s. Seen at [facility] and transferred to [facility] where he ultimately died.  Labs: Hep A AB, IgM anegative, HBsAg screen negative, Hep B core Ab, igM negative, Hep C virus Ab negative.      12/11/2013 US abdomen/complete: IMPRESSION:  Cirrhotic liver with question probable gallbladder polyp at fundus.  Incomplete pancreatic visualization.  Splenomegaly.  No additional acute abnormalities.   12/08/2013 AFP 2.7  .Labs/studies Results:  Liver biopsy on 09/30/2012 revealed steatohepatitis and mild portal and focal sinusoidal fibrosis.    09/17/2012 EGD: Impression:  Single short column of grade 1 esophageal varix.  Two small patches of salmon-colored mucosa suspicious for short segment Barrett's. Biopsy taken.  Mild portal gastropathy.   Hepatic Function Panel     Component Value Date/Time   PROT 6.8 12/08/2013 1357   ALBUMIN 4.4 12/08/2013 1357   AST 38* 12/08/2013 1357   ALT 48 12/08/2013 1357   ALKPHOS 185* 12/08/2013 1357   BILITOT 1.5* 12/08/2013 1357   BILIDIR 0.3 12/08/2013 1357   IBILI 1.2 12/08/2013 1357   CBC    Component Value Date/Time   WBC 4.8 05/13/2013 0929   RBC 5.21 05/13/2013 0929   HGB 16.0 05/13/2013 0929   HCT 45.8 05/13/2013 0929   PLT 121* 05/13/2013 0929   MCV 87.9 05/13/2013 0929   MCH 30.7 05/13/2013 0929   MCHC 34.9 05/13/2013 0929   RDW 12.5 05/13/2013 0929   LYMPHSABS 0.9 05/13/2013 0929   MONOABS 0.4 05/13/2013  0929   EOSABS 0.3 05/13/2013 0929   BASOSABS 0.0 05/13/2013 0929      Review of Systems Past Medical History  Diagnosis Date  . Kidney stones   . Cirrhosis   . Asthma     as a kid    Past Surgical History  Procedure Laterality Date  . Replacement total knee      Bilateral knee replacement, 2010, 2012  . Esophagogastroduodenoscopy N/A 09/17/2012    Procedure: ESOPHAGOGASTRODUODENOSCOPY (EGD);  Surgeon: Malissa Hippo, MD;  Location: [facility];  Service: Endoscopy;  Laterality: N/A;  200  . Hernia repair Bilateral     x 2  . Tonsillectomy    . Inguinal hernia repair Bilateral 05/22/2013    Procedure: REPAIR OF RECURRENT INCARCERATED INGUINAL HERNIA WITH MESH RIGHT SIDE,  REPAIR OF RECURRENT INGUINAL HERNIA WITH MESH LEFT SIDE;  Surgeon: Ernestene Mention, MD;  Location: [facility] OR;  Service: General;  Laterality: Bilateral;  . Insertion of mesh Bilateral 05/22/2013    Procedure: INSERTION OF MESH;  Surgeon: Ernestene Mention, MD;  Location: [facility] OR;  Service: General;  Laterality: Bilateral;    Allergies  Allergen Reactions  . Aspirin Shortness Of Breath  . Penicillins Shortness Of Breath    Current Outpatient Prescriptions on File Prior to Visit  Medication Sig Dispense Refill  . Milk Thistle 1000 MG CAPS Take 1,000  mg by mouth daily.      . pantoprazole (PROTONIX) 40 MG tablet Take 1 tablet (40 mg total) by mouth daily.  30 tablet  5  . vitamin C (ASCORBIC ACID) 500 MG tablet Take 1 tablet (500 mg total) by mouth daily.       No current facility-administered medications on file prior to visit.        Objective:   Physical Exam  Filed Vitals:   04/06/14 1537  BP: 110/64  Pulse: 72  Temp: 97.4 F (36.3 C)  Height: 6' (1.829 m)  Weight: 216 lb 3.2 oz (98.068 kg)   Alert and oriented. Skin warm and dry. Oral mucosa is moist.   . Sclera anicteric, conjunctivae is pink. Thyroid not enlarged. No cervical lymphadenopathy. Lungs clear. Heart regular rate and rhythm.   Abdomen is soft. Bowel sounds are positive. No hepatomegaly. No abdominal masses felt. No tenderness.  No edema to lower extremities.     Assessment & Plan:  NAFLD. He seems to be doing well. He is trying to exericse. He has gained 3 pounds since his last visit.  Bloating.: Will get an [place] to be sure there is no ascites. CBC and Hepatic profile today.

## 2014-04-07 LAB — HEPATIC FUNCTION PANEL
ALBUMIN: 4.3 g/dL (ref 3.5–5.2)
ALT: 47 U/L (ref 0–53)
AST: 33 U/L (ref 0–37)
Alkaline Phosphatase: 147 U/L — ABNORMAL HIGH (ref 39–117)
Bilirubin, Direct: 0.3 mg/dL (ref 0.0–0.3)
Indirect Bilirubin: 1.1 mg/dL (ref 0.2–1.2)
TOTAL PROTEIN: 6.5 g/dL (ref 6.0–8.3)
Total Bilirubin: 1.4 mg/dL — ABNORMAL HIGH (ref 0.2–1.2)

## 2014-04-14 ENCOUNTER — Telehealth (INDEPENDENT_AMBULATORY_CARE_PROVIDER_SITE_OTHER): Payer: Self-pay | Admitting: Internal Medicine

## 2014-04-14 ENCOUNTER — Ambulatory Visit (HOSPITAL_COMMUNITY)
Admission: RE | Admit: 2014-04-14 | Discharge: 2014-04-14 | Disposition: A | Discharge status: Home / Self Care | Payer: Medicare Other | Source: Ambulatory Visit | Attending: Internal Medicine | Admitting: Internal Medicine

## 2014-04-14 DIAGNOSIS — K8080 Other cholelithiasis without obstruction: Secondary | ICD-10-CM | POA: Diagnosis not present

## 2014-04-14 DIAGNOSIS — R161 Splenomegaly, not elsewhere classified: Secondary | ICD-10-CM | POA: Insufficient documentation

## 2014-04-14 DIAGNOSIS — K7469 Other cirrhosis of liver: Secondary | ICD-10-CM

## 2014-04-14 DIAGNOSIS — R140 Abdominal distension (gaseous): Secondary | ICD-10-CM | POA: Insufficient documentation

## 2014-04-14 DIAGNOSIS — K7460 Unspecified cirrhosis of liver: Secondary | ICD-10-CM

## 2014-04-14 NOTE — Telephone Encounter (Signed)
US abdomen with elastrography.

## 2014-04-14 NOTE — Telephone Encounter (Signed)
Results of [place] given to patient.

## 2014-04-15 NOTE — Telephone Encounter (Signed)
[place] sch'd 04/21/14 @ 800 ([phone]), npo after midnight, left detailed message for patient

## 2014-04-21 ENCOUNTER — Ambulatory Visit (HOSPITAL_COMMUNITY)
Admission: RE | Admit: 2014-04-21 | Discharge: 2014-04-21 | Disposition: A | Discharge status: Home / Self Care | Payer: Medicare Other | Source: Ambulatory Visit | Attending: Internal Medicine | Admitting: Internal Medicine

## 2014-04-21 DIAGNOSIS — K7460 Unspecified cirrhosis of liver: Secondary | ICD-10-CM | POA: Diagnosis present

## 2014-06-08 ENCOUNTER — Encounter (INDEPENDENT_AMBULATORY_CARE_PROVIDER_SITE_OTHER): Payer: Self-pay

## 2014-06-08 ENCOUNTER — Ambulatory Visit (INDEPENDENT_AMBULATORY_CARE_PROVIDER_SITE_OTHER): Payer: Medicare Other | Admitting: Internal Medicine

## 2014-06-08 ENCOUNTER — Encounter (INDEPENDENT_AMBULATORY_CARE_PROVIDER_SITE_OTHER): Payer: Self-pay | Admitting: Internal Medicine

## 2014-06-08 VITALS — BP 106/70 | HR 74 | Temp 97.6°F | Resp 18 | Ht 72.0 in | Wt 215.4 lb

## 2014-06-08 DIAGNOSIS — K7460 Unspecified cirrhosis of liver: Secondary | ICD-10-CM

## 2014-06-08 DIAGNOSIS — D696 Thrombocytopenia, unspecified: Secondary | ICD-10-CM

## 2014-06-08 DIAGNOSIS — K2270 Barrett's esophagus without dysplasia: Secondary | ICD-10-CM

## 2014-06-08 DIAGNOSIS — K2100 Gastro-esophageal reflux disease with esophagitis, without bleeding: Secondary | ICD-10-CM

## 2014-06-08 NOTE — Progress Notes (Signed)
Presenting complaint;  Follow-up for cirrhosis and GERD.  Subjective:  Patient is 58 year-old Caucasian male who has cirrhosis secondary to NAFLD and GERD complicated by short segment Barrett's esophagus was here for scheduled visit. He was last seen 2 months ago and was complaining of bloating. He had abdominal ultrasound was negative for ascites. He has no complaints. He says pantoprazole is working. He rarely experiences heartburn or regurgitation. He denies abdominal pain or lower extremity edema. He is going to [facility] center at [facility] history times a week. Lately he has not been able to walk because of recurrence of left Baker's cyst. He's had it drained multiple times. His bowels move regularly and he denies melena or rectal bleeding. He has not lost any weight since his last visit.   Current Medications: Outpatient Encounter Prescriptions as of 06/08/2014  Medication Sig  . HALOG 0.1 % CREA as needed.   . Milk Thistle 1000 MG CAPS Take 1,000 mg by mouth daily.  . pantoprazole (PROTONIX) 40 MG tablet Take 1 tablet (40 mg total) by mouth daily.  . vitamin C (ASCORBIC ACID) 500 MG tablet Take 1 tablet (500 mg total) by mouth daily.     Objective: Blood pressure 106/70, pulse 74, temperature 97.6 F (36.4 C), temperature source Oral, resp. rate 18, height 6' (1.829 m), weight 215 lb 6.4 oz (97.705 kg). Patient is alert and does not have asterixis. Conjunctiva is pink. Sclera is nonicteric Oropharyngeal mucosa is normal. No neck masses or thyromegaly noted. Cardiac exam with regular rhythm normal S1 and S2. No murmur or gallop noted. Lungs are clear to auscultation. Abdomen is soft and nontender. Spleen tip is easily palpable. Liver edge is indistinct. He has bilateral scars above and parallel to inguinal ligaments. No LE edema or clubbing noted.  Labs/studies Results:  ultrasound on 04/21/2014 revealed echogenic and slightly heterogeneous liver parenchyma . splenomegaly with spleen  measuring 17.5 cm and no evidence of ascites. No focal abnormalities involving the liver.  Metavir fibrosis score  F0/F1.  Lab data from 04/06/2014 WBC 4.5, H&H 16.1 and 46.0 and platelet count 112 dictated [place] 1.4 direct 0.3, AP 147, AST 33, ALT 47 albumin 4.3. Last EGD was in March 2014 revealing single short column of grade 1 esophageal varix mild portal gastropathy and short segment Barrett's confirmed via biopsy.   Assessment:  #1. Cirrhosis secondary to NAFLD. He has well-preserved hepatic function. Elastography does not correspond to clinical diagnosis of cirrhosis. Similarly liver biopsy in April last year revealed portal fibrosis but no evidence of cirrhosis. I believe her disease has been underestimated with elastography and biopsy. #2. Thrombocytopenia secondary to chronic liver disease and splenomegaly. #3. GERD complicated by short segment Barrett's esophagus. Symptoms well controlled. #4. Last colonoscopy was in January 2008 with removal of small polyp which was tubular adenoma.  Plan:  Patient encouraged to walk and exercise daily if possible for a minimum of 30 minutes. He will have ultrasound and AFP in April 2016. Office visit in 1 year.

## 2014-06-08 NOTE — Patient Instructions (Signed)
Ultrasound and AFP in April 2016.

## 2014-06-14 ENCOUNTER — Ambulatory Visit (INDEPENDENT_AMBULATORY_CARE_PROVIDER_SITE_OTHER): Payer: Medicare Other | Admitting: Internal Medicine

## 2014-08-05 DIAGNOSIS — Z96659 Presence of unspecified artificial knee joint: Secondary | ICD-10-CM | POA: Insufficient documentation

## 2014-08-17 ENCOUNTER — Other Ambulatory Visit (HOSPITAL_COMMUNITY): Payer: Self-pay | Admitting: Orthopedic Surgery

## 2014-08-17 DIAGNOSIS — M7121 Synovial cyst of popliteal space [Baker], right knee: Secondary | ICD-10-CM

## 2014-08-18 ENCOUNTER — Ambulatory Visit (HOSPITAL_COMMUNITY)
Admission: RE | Admit: 2014-08-18 | Discharge: 2014-08-18 | Disposition: A | Discharge status: Home / Self Care | Payer: Medicare Other | Source: Ambulatory Visit | Attending: Orthopedic Surgery | Admitting: Orthopedic Surgery

## 2014-08-18 DIAGNOSIS — M7121 Synovial cyst of popliteal space [Baker], right knee: Secondary | ICD-10-CM | POA: Diagnosis present

## 2014-08-18 DIAGNOSIS — Z96651 Presence of right artificial knee joint: Secondary | ICD-10-CM | POA: Diagnosis not present

## 2014-08-24 ENCOUNTER — Other Ambulatory Visit: Payer: Self-pay | Admitting: Orthopedic Surgery

## 2014-08-31 NOTE — Pre-Procedure Instructions (Signed)
Stephen Burch  08/31/2014   Your procedure is scheduled on:  09-06-2014   Monday   Report to [facility] Admitting at 6:45 AM.   Call this number if you have problems the morning of surgery: [phone]   Remember:   Do not eat food or drink liquids after midnight.    Take these medicines the morning of surgery with A SIP OF WATER: pantoprazole(Protonix)   Do not wear jewelry,     Do not wear lotions, powders, or perfumes. You may not wear deodorant.  Do not shave 48 hours prior to surgery. Men may shave face and neck.   Do not bring valuables to the hospital.  [facility] is not responsible for any belongings or valuables.               Contacts, dentures or bridgework may not be worn into surgery.   Leave suitcase in the car. After surgery it may be brought to your room.   For patients admitted to the hospital, discharge time is determined by your  treatment team.               Patients discharged the day of surgery will not be allowed to drive home.   Name and phone number of your driver:    Special Instructions: See attached Sheet for instructions of CHG shower/bath   Please read over the following fact sheets that you were given: Pain Booklet and Surgical Site Infection Prevention

## 2014-09-01 ENCOUNTER — Encounter (HOSPITAL_COMMUNITY): Payer: Self-pay

## 2014-09-01 ENCOUNTER — Encounter (HOSPITAL_COMMUNITY)
Admission: RE | Admit: 2014-09-01 | Discharge: 2014-09-01 | Disposition: A | Discharge status: Home / Self Care | Payer: Medicare Other | Source: Ambulatory Visit | Attending: Orthopedic Surgery | Admitting: Orthopedic Surgery

## 2014-09-01 DIAGNOSIS — Z87891 Personal history of nicotine dependence: Secondary | ICD-10-CM | POA: Diagnosis not present

## 2014-09-01 DIAGNOSIS — Z01812 Encounter for preprocedural laboratory examination: Secondary | ICD-10-CM | POA: Diagnosis not present

## 2014-09-01 DIAGNOSIS — K7460 Unspecified cirrhosis of liver: Secondary | ICD-10-CM | POA: Diagnosis not present

## 2014-09-01 DIAGNOSIS — Z01818 Encounter for other preprocedural examination: Secondary | ICD-10-CM | POA: Insufficient documentation

## 2014-09-01 DIAGNOSIS — J45909 Unspecified asthma, uncomplicated: Secondary | ICD-10-CM | POA: Insufficient documentation

## 2014-09-01 DIAGNOSIS — M7121 Synovial cyst of popliteal space [Baker], right knee: Secondary | ICD-10-CM | POA: Diagnosis not present

## 2014-09-01 DIAGNOSIS — K219 Gastro-esophageal reflux disease without esophagitis: Secondary | ICD-10-CM | POA: Diagnosis not present

## 2014-09-01 DIAGNOSIS — R001 Bradycardia, unspecified: Secondary | ICD-10-CM | POA: Diagnosis not present

## 2014-09-01 HISTORY — DX: Unspecified osteoarthritis, unspecified site: M19.90

## 2014-09-01 HISTORY — DX: Gastro-esophageal reflux disease without esophagitis: K21.9

## 2014-09-01 LAB — CBC
HCT: 46.3 % (ref 39.0–52.0)
Hemoglobin: 16.1 g/dL (ref 13.0–17.0)
MCH: 30.4 pg (ref 26.0–34.0)
MCHC: 34.8 g/dL (ref 30.0–36.0)
MCV: 87.4 fL (ref 78.0–100.0)
Platelets: 105 10*3/uL — ABNORMAL LOW (ref 150–400)
RBC: 5.3 MIL/uL (ref 4.22–5.81)
RDW: 13.3 % (ref 11.5–15.5)
WBC: 3.4 10*3/uL — AB (ref 4.0–10.5)

## 2014-09-01 LAB — COMPREHENSIVE METABOLIC PANEL
ALT: 47 U/L (ref 0–53)
AST: 38 U/L — ABNORMAL HIGH (ref 0–37)
Albumin: 4.2 g/dL (ref 3.5–5.2)
Alkaline Phosphatase: 140 U/L — ABNORMAL HIGH (ref 39–117)
Anion gap: 1 — ABNORMAL LOW (ref 5–15)
BILIRUBIN TOTAL: 1.3 mg/dL — AB (ref 0.3–1.2)
BUN: 9 mg/dL (ref 6–23)
CHLORIDE: 106 mmol/L (ref 96–112)
CO2: 32 mmol/L (ref 19–32)
Calcium: 8.8 mg/dL (ref 8.4–10.5)
Creatinine, Ser: 1.03 mg/dL (ref 0.50–1.35)
GFR calc Af Amer: 89 mL/min — ABNORMAL LOW (ref >90)
GFR calc non Af Amer: 77 mL/min — ABNORMAL LOW (ref >90)
Glucose, Bld: 114 mg/dL — ABNORMAL HIGH (ref 70–99)
Potassium: 4 mmol/L (ref 3.5–5.1)
Sodium: 139 mmol/L (ref 135–145)
Total Protein: 7.1 g/dL (ref 6.0–8.3)

## 2014-09-01 MED ORDER — CHLORHEXIDINE GLUCONATE 4 % EX LIQD: 60.0000 mL | Freq: Once | CUTANEOUS | Status: DC

## 2014-09-02 NOTE — Progress Notes (Signed)
Anesthesia Chart Review:  Pt is 59 year old male scheduled for open excision of Baker's cyst R knee on 09/06/2014 with Dr. Ronnie Derby.   PMH includes: Asthma, cirrhosis, GERD. S/p repair incarcerated inguinal hernia 05/22/2013. Former smoker. BMI 30.   Preoperative labs reviewed.  Platelets 105, c/w previous results.   EKG: Sinus bradycardia (55 bpm). Minimal voltage criteria for LVH, may be normal variant. Cannot rule out Anterior infarct, age undetermined. Since previous tracing 07/04/12 LVH is new, rate is lower. Low R in V3 is read as anterior infarct but may just be lead placement.  Reviewed EKG with Dr. Therisa Doyne.   If no changes, I anticipate pt can proceed with surgery as scheduled.   Willeen Cass, FNP-BC [facility]/Anesthesiology Phone: [phone] 09/02/2014 2:43 PM

## 2014-09-05 MED ORDER — VANCOMYCIN HCL IN DEXTROSE 1-5 GM/200ML-% IV SOLN
1000.0000 mg | INTRAVENOUS | Status: AC
  Administered 2014-09-06: 1000 mg via INTRAVENOUS
  Filled 2014-09-05: qty 200

## 2014-09-05 NOTE — Anesthesia Preprocedure Evaluation (Addendum)
Anesthesia Evaluation  Patient identified by MRN, date of birth, ID band Patient awake    Reviewed: Allergy & Precautions, NPO status , Patient's Chart, lab work & pertinent test results  Airway Mallampati: II  TM Distance: >3 FB Neck ROM: Full    Dental  (+) Teeth Intact, Dental Advisory Given   Pulmonary asthma , former smoker,  breath sounds clear to auscultation        Cardiovascular Rhythm:Regular  EKG,  possible AS MI old, LVV   Neuro/Psych    GI/Hepatic GERD-  Medicated,  Endo/Other    Renal/GU      Musculoskeletal   Abdominal (+)  Abdomen: soft.    Peds  Hematology 16/46   Anesthesia Other Findings   Reproductive/Obstetrics                           Anesthesia Physical Anesthesia Plan  ASA: II  Anesthesia Plan: General   Post-op Pain Management:    Induction: Intravenous  Airway Management Planned: Oral ETT  Additional Equipment:   Intra-op Plan:   Post-operative Plan: Extubation in OR  Informed Consent: I have reviewed the patients History and Physical, chart, labs and discussed the procedure including the risks, benefits and alternatives for the proposed anesthesia with the patient or authorized representative who has indicated his/her understanding and acceptance.     Plan Discussed with:   Anesthesia Plan Comments:         Anesthesia Quick Evaluation

## 2014-09-06 ENCOUNTER — Ambulatory Visit (HOSPITAL_COMMUNITY): Payer: Medicare Other | Admitting: Certified Registered Nurse Anesthetist

## 2014-09-06 ENCOUNTER — Encounter (HOSPITAL_COMMUNITY)
Admission: RE | Disposition: A | Discharge status: Home / Self Care | Payer: Self-pay | Source: Ambulatory Visit | Attending: Orthopedic Surgery

## 2014-09-06 ENCOUNTER — Ambulatory Visit (HOSPITAL_COMMUNITY): Payer: Medicare Other | Admitting: Emergency Medicine

## 2014-09-06 ENCOUNTER — Inpatient Hospital Stay (HOSPITAL_COMMUNITY)
Admission: RE | Admit: 2014-09-06 | Discharge: 2014-09-07 | DRG: 489 | Disposition: A | Discharge status: Home / Self Care | Payer: Medicare Other | Source: Ambulatory Visit | Attending: Orthopedic Surgery | Admitting: Orthopedic Surgery

## 2014-09-06 ENCOUNTER — Encounter (HOSPITAL_COMMUNITY): Payer: Self-pay | Admitting: Certified Registered"

## 2014-09-06 DIAGNOSIS — K210 Gastro-esophageal reflux disease with esophagitis: Secondary | ICD-10-CM | POA: Diagnosis present

## 2014-09-06 DIAGNOSIS — M7121 Synovial cyst of popliteal space [Baker], right knee: Secondary | ICD-10-CM | POA: Diagnosis present

## 2014-09-06 DIAGNOSIS — Z87891 Personal history of nicotine dependence: Secondary | ICD-10-CM

## 2014-09-06 DIAGNOSIS — Z96653 Presence of artificial knee joint, bilateral: Secondary | ICD-10-CM | POA: Diagnosis present

## 2014-09-06 DIAGNOSIS — M7120 Synovial cyst of popliteal space [Baker], unspecified knee: Secondary | ICD-10-CM | POA: Diagnosis present

## 2014-09-06 DIAGNOSIS — M7122 Synovial cyst of popliteal space [Baker], left knee: Secondary | ICD-10-CM

## 2014-09-06 DIAGNOSIS — M25561 Pain in right knee: Secondary | ICD-10-CM | POA: Diagnosis present

## 2014-09-06 HISTORY — PX: EAR CYST EXCISION: SHX22

## 2014-09-06 SURGERY — EXCISION, SYNOVIAL CYST, POPLITEAL SPACE
Anesthesia: General | Site: Knee | Laterality: Right

## 2014-09-06 MED ORDER — FENTANYL CITRATE 0.05 MG/ML IJ SOLN
INTRAMUSCULAR | Status: DC | PRN
  Administered 2014-09-06: 50 ug via INTRAVENOUS
  Administered 2014-09-06 (×2): 100 ug via INTRAVENOUS

## 2014-09-06 MED ORDER — BUPIVACAINE LIPOSOME 1.3 % IJ SUSP
20.0000 mL | INTRAMUSCULAR | Status: DC
  Filled 2014-09-06: qty 20

## 2014-09-06 MED ORDER — ONDANSETRON HCL 4 MG/2ML IJ SOLN
INTRAMUSCULAR | Status: AC
  Filled 2014-09-06: qty 2

## 2014-09-06 MED ORDER — ROCURONIUM BROMIDE 50 MG/5ML IV SOLN
INTRAVENOUS | Status: AC
  Filled 2014-09-06: qty 1

## 2014-09-06 MED ORDER — ACETAMINOPHEN 650 MG RE SUPP: 650.0000 mg | Freq: Four times a day (QID) | RECTAL | Status: DC | PRN

## 2014-09-06 MED ORDER — ALUM & MAG HYDROXIDE-SIMETH 200-200-20 MG/5ML PO SUSP: 30.0000 mL | ORAL | Status: DC | PRN

## 2014-09-06 MED ORDER — FENTANYL CITRATE 0.05 MG/ML IJ SOLN
INTRAMUSCULAR | Status: AC
  Filled 2014-09-06: qty 5

## 2014-09-06 MED ORDER — MIDAZOLAM HCL 5 MG/5ML IJ SOLN
INTRAMUSCULAR | Status: DC | PRN
  Administered 2014-09-06: 2 mg via INTRAVENOUS

## 2014-09-06 MED ORDER — PHENOL 1.4 % MT LIQD: 1.0000 | OROMUCOSAL | Status: DC | PRN

## 2014-09-06 MED ORDER — SENNOSIDES-DOCUSATE SODIUM 8.6-50 MG PO TABS: 1.0000 | ORAL_TABLET | Freq: Every evening | ORAL | Status: DC | PRN

## 2014-09-06 MED ORDER — FLEET ENEMA 7-19 GM/118ML RE ENEM: 1.0000 | ENEMA | Freq: Once | RECTAL | Status: AC | PRN

## 2014-09-06 MED ORDER — PANTOPRAZOLE SODIUM 40 MG PO TBEC: 40.0000 mg | DELAYED_RELEASE_TABLET | Freq: Every day | ORAL | Status: DC

## 2014-09-06 MED ORDER — PROPOFOL 10 MG/ML IV BOLUS
INTRAVENOUS | Status: DC | PRN
  Administered 2014-09-06: 170 mg via INTRAVENOUS

## 2014-09-06 MED ORDER — NEOSTIGMINE METHYLSULFATE 10 MG/10ML IV SOLN
INTRAVENOUS | Status: DC | PRN
  Administered 2014-09-06: 3 mg via INTRAVENOUS

## 2014-09-06 MED ORDER — BUPIVACAINE-EPINEPHRINE (PF) 0.25% -1:200000 IJ SOLN
INTRAMUSCULAR | Status: DC | PRN
  Administered 2014-09-06: 30 mL

## 2014-09-06 MED ORDER — LIDOCAINE HCL (CARDIAC) 20 MG/ML IV SOLN
INTRAVENOUS | Status: AC
  Filled 2014-09-06: qty 5

## 2014-09-06 MED ORDER — MEPERIDINE HCL 25 MG/ML IJ SOLN
6.2500 mg | INTRAMUSCULAR | Status: DC | PRN
Start: 2014-09-06 — End: 2014-09-06

## 2014-09-06 MED ORDER — METHOCARBAMOL 500 MG PO TABS: 500.0000 mg | ORAL_TABLET | Freq: Four times a day (QID) | ORAL | Status: DC | PRN

## 2014-09-06 MED ORDER — DOCUSATE SODIUM 100 MG PO CAPS
100.0000 mg | ORAL_CAPSULE | Freq: Two times a day (BID) | ORAL | Status: DC
  Administered 2014-09-06 (×2): 100 mg via ORAL
  Filled 2014-09-06 (×2): qty 1

## 2014-09-06 MED ORDER — MENTHOL 3 MG MT LOZG: 1.0000 | LOZENGE | OROMUCOSAL | Status: DC | PRN

## 2014-09-06 MED ORDER — VANCOMYCIN HCL IN DEXTROSE 1-5 GM/200ML-% IV SOLN
1000.0000 mg | Freq: Two times a day (BID) | INTRAVENOUS | Status: AC
  Administered 2014-09-06: 1000 mg via INTRAVENOUS
  Filled 2014-09-06: qty 200

## 2014-09-06 MED ORDER — EPHEDRINE SULFATE 50 MG/ML IJ SOLN
INTRAMUSCULAR | Status: DC | PRN
  Administered 2014-09-06 (×4): 10 mg via INTRAVENOUS

## 2014-09-06 MED ORDER — LACTATED RINGERS IV SOLN
INTRAVENOUS | Status: DC
  Administered 2014-09-06 (×2): via INTRAVENOUS

## 2014-09-06 MED ORDER — FENTANYL CITRATE 0.05 MG/ML IJ SOLN: 25.0000 ug | INTRAMUSCULAR | Status: DC | PRN

## 2014-09-06 MED ORDER — HYDROMORPHONE HCL 1 MG/ML IJ SOLN: 1.0000 mg | INTRAMUSCULAR | Status: DC | PRN

## 2014-09-06 MED ORDER — METOCLOPRAMIDE HCL 5 MG/ML IJ SOLN: 5.0000 mg | Freq: Three times a day (TID) | INTRAMUSCULAR | Status: DC | PRN

## 2014-09-06 MED ORDER — ARTIFICIAL TEARS OP OINT
TOPICAL_OINTMENT | OPHTHALMIC | Status: AC
  Filled 2014-09-06: qty 3.5

## 2014-09-06 MED ORDER — LIDOCAINE HCL (CARDIAC) 20 MG/ML IV SOLN
INTRAVENOUS | Status: DC | PRN
  Administered 2014-09-06: 100 mg via INTRAVENOUS

## 2014-09-06 MED ORDER — ACETAMINOPHEN 325 MG PO TABS: 650.0000 mg | ORAL_TABLET | Freq: Four times a day (QID) | ORAL | Status: DC | PRN

## 2014-09-06 MED ORDER — ACETAMINOPHEN 500 MG PO TABS
1000.0000 mg | ORAL_TABLET | Freq: Four times a day (QID) | ORAL | Status: AC
  Administered 2014-09-06 – 2014-09-07 (×4): 1000 mg via ORAL
  Filled 2014-09-06 (×4): qty 2

## 2014-09-06 MED ORDER — METOCLOPRAMIDE HCL 10 MG PO TABS: 5.0000 mg | ORAL_TABLET | Freq: Three times a day (TID) | ORAL | Status: DC | PRN

## 2014-09-06 MED ORDER — ROCURONIUM BROMIDE 100 MG/10ML IV SOLN
INTRAVENOUS | Status: DC | PRN
  Administered 2014-09-06: 35 mg via INTRAVENOUS

## 2014-09-06 MED ORDER — PHENYLEPHRINE HCL 10 MG/ML IJ SOLN
INTRAMUSCULAR | Status: DC | PRN
  Administered 2014-09-06 (×2): 120 ug via INTRAVENOUS

## 2014-09-06 MED ORDER — PROPOFOL 10 MG/ML IV BOLUS
INTRAVENOUS | Status: AC
  Filled 2014-09-06: qty 20

## 2014-09-06 MED ORDER — ONDANSETRON HCL 4 MG PO TABS: 4.0000 mg | ORAL_TABLET | Freq: Four times a day (QID) | ORAL | Status: DC | PRN

## 2014-09-06 MED ORDER — SODIUM CHLORIDE 0.9 % IJ SOLN
INTRAMUSCULAR | Status: AC
  Filled 2014-09-06: qty 10

## 2014-09-06 MED ORDER — GLYCOPYRROLATE 0.2 MG/ML IJ SOLN
INTRAMUSCULAR | Status: DC | PRN
  Administered 2014-09-06: 0.4 mg via INTRAVENOUS

## 2014-09-06 MED ORDER — ARTIFICIAL TEARS OP OINT
TOPICAL_OINTMENT | OPHTHALMIC | Status: DC | PRN
  Administered 2014-09-06: 1 via OPHTHALMIC

## 2014-09-06 MED ORDER — METHOCARBAMOL 1000 MG/10ML IJ SOLN
500.0000 mg | Freq: Four times a day (QID) | INTRAVENOUS | Status: DC | PRN
  Filled 2014-09-06: qty 5

## 2014-09-06 MED ORDER — SODIUM CHLORIDE 0.9 % IR SOLN
Status: DC | PRN
  Administered 2014-09-06: 1000 mL

## 2014-09-06 MED ORDER — MIDAZOLAM HCL 2 MG/2ML IJ SOLN
INTRAMUSCULAR | Status: AC
  Filled 2014-09-06: qty 2

## 2014-09-06 MED ORDER — ONDANSETRON HCL 4 MG/2ML IJ SOLN
INTRAMUSCULAR | Status: DC | PRN
  Administered 2014-09-06: 4 mg via INTRAVENOUS

## 2014-09-06 MED ORDER — CHLORHEXIDINE GLUCONATE 4 % EX LIQD
60.0000 mL | Freq: Once | CUTANEOUS | Status: DC
  Filled 2014-09-06: qty 60

## 2014-09-06 MED ORDER — BUPIVACAINE LIPOSOME 1.3 % IJ SUSP
INTRAMUSCULAR | Status: DC | PRN
  Administered 2014-09-06: 20 mL

## 2014-09-06 MED ORDER — ZOLPIDEM TARTRATE 5 MG PO TABS: 5.0000 mg | ORAL_TABLET | Freq: Every evening | ORAL | Status: DC | PRN

## 2014-09-06 MED ORDER — DIPHENHYDRAMINE HCL 12.5 MG/5ML PO ELIX: 12.5000 mg | ORAL_SOLUTION | ORAL | Status: DC | PRN

## 2014-09-06 MED ORDER — ONDANSETRON HCL 4 MG/2ML IJ SOLN: 4.0000 mg | Freq: Four times a day (QID) | INTRAMUSCULAR | Status: DC | PRN

## 2014-09-06 MED ORDER — SODIUM CHLORIDE 0.9 % IV SOLN
INTRAVENOUS | Status: DC
  Administered 2014-09-06: 16:00:00 via INTRAVENOUS

## 2014-09-06 MED ORDER — OXYCODONE HCL 5 MG PO TABS: 5.0000 mg | ORAL_TABLET | ORAL | Status: DC | PRN

## 2014-09-06 MED ORDER — PROMETHAZINE HCL 25 MG/ML IJ SOLN: 6.2500 mg | INTRAMUSCULAR | Status: DC | PRN

## 2014-09-06 MED ORDER — BISACODYL 5 MG PO TBEC: 5.0000 mg | DELAYED_RELEASE_TABLET | Freq: Every day | ORAL | Status: DC | PRN

## 2014-09-06 SURGICAL SUPPLY — 62 items
BAG URINE DRAINAGE (UROLOGICAL SUPPLIES) IMPLANT
BANDAGE ELASTIC 6 VELCRO ST LF (GAUZE/BANDAGES/DRESSINGS) ×2 IMPLANT
BANDAGE ESMARK 6X9 LF (GAUZE/BANDAGES/DRESSINGS) ×1 IMPLANT
BNDG ESMARK 6X9 LF (GAUZE/BANDAGES/DRESSINGS) ×2
BOWL SMART MIX CTS (DISPOSABLE) IMPLANT
CONT SPEC 4OZ CLIKSEAL STRL BL (MISCELLANEOUS) ×4 IMPLANT
COVER SURGICAL LIGHT HANDLE (MISCELLANEOUS) ×2 IMPLANT
CUFF TOURNIQUET SINGLE 34IN LL (TOURNIQUET CUFF) ×2 IMPLANT
DRAPE EXTREMITY T 121X128X90 (DRAPE) ×2 IMPLANT
DRAPE IMP U-DRAPE 54X76 (DRAPES) ×2 IMPLANT
DRAPE INCISE IOBAN 66X45 STRL (DRAPES) IMPLANT
DRAPE ORTHO SPLIT 77X108 STRL (DRAPES) ×1
DRAPE PROXIMA HALF (DRAPES) ×2 IMPLANT
DRAPE SURG ORHT 6 SPLT 77X108 (DRAPES) ×1 IMPLANT
DRAPE U-SHAPE 47X51 STRL (DRAPES) ×2 IMPLANT
DRSG ADAPTIC 3X8 NADH LF (GAUZE/BANDAGES/DRESSINGS) ×2 IMPLANT
DRSG PAD ABDOMINAL 8X10 ST (GAUZE/BANDAGES/DRESSINGS) ×2 IMPLANT
DURAPREP 26ML APPLICATOR (WOUND CARE) ×4 IMPLANT
ELECT REM PT RETURN 9FT ADLT (ELECTROSURGICAL) ×2
ELECTRODE REM PT RTRN 9FT ADLT (ELECTROSURGICAL) ×1 IMPLANT
EVACUATOR 1/8 PVC DRAIN (DRAIN) IMPLANT
GAUZE SPONGE 4X4 12PLY STRL (GAUZE/BANDAGES/DRESSINGS) ×2 IMPLANT
GAUZE XEROFORM 1X8 LF (GAUZE/BANDAGES/DRESSINGS) ×2 IMPLANT
GLOVE BIOGEL M 7.0 STRL (GLOVE) ×2 IMPLANT
GLOVE BIOGEL PI IND STRL 7.5 (GLOVE) IMPLANT
GLOVE BIOGEL PI IND STRL 8.5 (GLOVE) ×2 IMPLANT
GLOVE BIOGEL PI INDICATOR 7.5 (GLOVE)
GLOVE BIOGEL PI INDICATOR 8.5 (GLOVE) ×2
GLOVE SURG ORTHO 7.0 STRL STRW (GLOVE) IMPLANT
GLOVE SURG ORTHO 8.0 STRL STRW (GLOVE) ×4 IMPLANT
GOWN STRL REUS W/ TWL LRG LVL3 (GOWN DISPOSABLE) ×2 IMPLANT
GOWN STRL REUS W/ TWL XL LVL3 (GOWN DISPOSABLE) ×2 IMPLANT
GOWN STRL REUS W/TWL LRG LVL3 (GOWN DISPOSABLE) ×2
GOWN STRL REUS W/TWL XL LVL3 (GOWN DISPOSABLE) ×2
HANDPIECE INTERPULSE COAX TIP (DISPOSABLE)
HOOD PEEL AWAY FACE SHEILD DIS (HOOD) IMPLANT
IMMOBILIZER KNEE 22  40 CIR (ORTHOPEDIC SUPPLIES) ×1
IMMOBILIZER KNEE 22 40 CIR (ORTHOPEDIC SUPPLIES) ×1 IMPLANT
KIT BASIN OR (CUSTOM PROCEDURE TRAY) ×2 IMPLANT
KIT ROOM TURNOVER OR (KITS) ×2 IMPLANT
MANIFOLD NEPTUNE II (INSTRUMENTS) ×2 IMPLANT
NS IRRIG 1000ML POUR BTL (IV SOLUTION) ×2 IMPLANT
PACK TOTAL JOINT (CUSTOM PROCEDURE TRAY) ×2 IMPLANT
PACK UNIVERSAL I (CUSTOM PROCEDURE TRAY) ×2 IMPLANT
PAD ARMBOARD 7.5X6 YLW CONV (MISCELLANEOUS) ×4 IMPLANT
PADDING CAST COTTON 6X4 STRL (CAST SUPPLIES) ×2 IMPLANT
SET HNDPC FAN SPRY TIP SCT (DISPOSABLE) IMPLANT
SPONGE GAUZE 4X4 12PLY STER LF (GAUZE/BANDAGES/DRESSINGS) ×2 IMPLANT
STAPLER VISISTAT 35W (STAPLE) ×2 IMPLANT
SUCTION FRAZIER TIP 10 FR DISP (SUCTIONS) ×2 IMPLANT
SUT PDS AB 1 CTXB1 36 (SUTURE) ×2 IMPLANT
SUT VIC AB 0 CTB1 27 (SUTURE) ×2 IMPLANT
SUT VIC AB 1 CT1 27 (SUTURE)
SUT VIC AB 1 CT1 27XBRD ANBCTR (SUTURE) IMPLANT
SUT VIC AB 2-0 CT1 27 (SUTURE) ×2
SUT VIC AB 2-0 CT1 TAPERPNT 27 (SUTURE) ×2 IMPLANT
SYR 20CC LL (SYRINGE) ×4 IMPLANT
SYRINGE 10CC LL (SYRINGE) IMPLANT
TOWEL OR 17X24 6PK STRL BLUE (TOWEL DISPOSABLE) ×2 IMPLANT
TOWEL OR 17X26 10 PK STRL BLUE (TOWEL DISPOSABLE) ×2 IMPLANT
TRAY FOLEY CATH 16FRSI W/METER (SET/KITS/TRAYS/PACK) IMPLANT
WATER STERILE IRR 1000ML POUR (IV SOLUTION) ×6 IMPLANT

## 2014-09-06 NOTE — Anesthesia Postprocedure Evaluation (Signed)
  Anesthesia Post-op Note  Patient: Stephen Burch  Procedure(s) Performed: Procedure(s): OPEN EXCISION BAKER'S CYST RIGHT KNEE (Right)  Patient Location: PACU  Anesthesia Type:General  Level of Consciousness: awake and alert   Airway and Oxygen Therapy: Patient Spontanous Breathing and Patient connected to nasal cannula oxygen  Post-op Pain: mild  Post-op Assessment: Post-op Vital signs reviewed and Patient's Cardiovascular Status Stable  Post-op Vital Signs: Reviewed and stable  Last Vitals:  Filed Vitals:   09/06/14 1100  BP: 126/66  Pulse: 62  Temp:   Resp: 17    Complications: No apparent anesthesia complications

## 2014-09-06 NOTE — H&P (Signed)
Stephen Burch MRN:  [id number] DOB/SEX:  [birth year]/male  CHIEF COMPLAINT:  Painful right Knee  HISTORY: Patient is a 59 y.o. male presented with a history of pain in the right knee. Onset of symptoms was gradual starting several years ago with gradually worsening course since that time. Prior procedures on the knee include arthroplasty. Patient has been treated conservatively with over-the-counter NSAIDs and activity modification. Patient currently rates pain in the knee at 5 out of 10 with activity. There is a baker's cyst that continues to growing having been drained a half dozen times.  PAST MEDICAL HISTORY: Patient Active Problem List   Diagnosis Date Noted  . Baker's cyst 12/08/2013  . Cirrhosis of liver without mention of alcohol 09/02/2012  . Bilateral inguinal hernia 08/01/2012  . Splenomegaly 08/01/2012  . Portal hypertension 08/01/2012  . Gallstones 08/01/2012  . Pain in joint, lower leg 01/09/2011  . Stiffness of joint, not elsewhere classified, lower leg 01/09/2011  . Difficulty in walking 01/09/2011   Past Medical History  Diagnosis Date  . Kidney stones   . Cirrhosis   . Asthma     as a kid  . Pneumonia     3 years ago  . GERD (gastroesophageal reflux disease)   . Arthritis     knees   Past Surgical History  Procedure Laterality Date  . Replacement total knee      Bilateral knee replacement, 2010, 2012  . Esophagogastroduodenoscopy N/A 09/17/2012    Procedure: ESOPHAGOGASTRODUODENOSCOPY (EGD);  Surgeon: Malissa Hippo, MD;  Location: [facility];  Service: Endoscopy;  Laterality: N/A;  200  . Hernia repair Bilateral     x 2  . Tonsillectomy    . Inguinal hernia repair Bilateral 05/22/2013    Procedure: REPAIR OF RECURRENT INCARCERATED INGUINAL HERNIA WITH MESH RIGHT SIDE,  REPAIR OF RECURRENT INGUINAL HERNIA WITH MESH LEFT SIDE;  Surgeon: Ernestene Mention, MD;  Location: [facility] OR;  Service: General;  Laterality: Bilateral;  . Insertion of mesh Bilateral  05/22/2013    Procedure: INSERTION OF MESH;  Surgeon: Ernestene Mention, MD;  Location: [facility] OR;  Service: General;  Laterality: Bilateral;  . Joint replacement       MEDICATIONS:   No prescriptions prior to admission    ALLERGIES:   Allergies  Allergen Reactions  . Aspirin Shortness Of Breath  . Penicillins Shortness Of Breath    REVIEW OF SYSTEMS:  A comprehensive review of systems was negative.   FAMILY HISTORY:   Family History  Problem Relation Age of Onset  . Colon cancer Neg Hx   . Colon polyps Neg Hx     SOCIAL HISTORY:   History  Substance Use Topics  . Smoking status: Former Smoker -- 0.25 packs/day for 3 years    Types: Cigarettes  . Smokeless tobacco: Never Used     Comment: none since age 17-1 pack per week when he was smoking   . Alcohol Use: No     Comment: rarely.  Last beer 6 months. Was not a heavy drinker.     EXAMINATION:  Vital signs in last 24 hours:    General appearance: alert, cooperative and no distress Lungs: clear to auscultation bilaterally Heart: regular rate and rhythm, S1, S2 normal, no murmur, click, rub or gallop Abdomen: soft, non-tender; bowel sounds normal; no masses,  no organomegaly Extremities: extremities normal, atraumatic, no cyanosis or edema and Homans sign is negative, no sign of DVT Pulses: 2+ and symmetric Skin: Skin  color, texture, turgor normal. No rashes or lesions Neurologic: Alert and oriented X 3, normal strength and tone. Normal symmetric reflexes. Normal coordination and gait  Musculoskeletal:  ROM 0-120, Ligaments intact,  Imaging Review Components in good alignment  Assessment/Plan: Right knee baker's cyst  Right knee baker's cyst removal  Ramaj Frangos 09/06/2014, 6:39 AM

## 2014-09-06 NOTE — Progress Notes (Signed)
OT Cancellation Note  Patient Details Name: Stephen Burch MRN: [id number] DOB: September 01, 1955   Cancelled Treatment:    Reason Eval/Treat Not Completed: OT screened, no needs identified, will sign off  [facility], OTR/L  [phone] 09/06/2014 09/06/2014, 4:56 PM

## 2014-09-06 NOTE — Transfer of Care (Signed)
Immediate Anesthesia Transfer of Care Note  Patient: Stephen Burch  Procedure(s) Performed: Procedure(s): OPEN EXCISION BAKER'S CYST RIGHT KNEE (Right)  Patient Location: PACU  Anesthesia Type:General  Level of Consciousness: sedated  Airway & Oxygen Therapy: Patient Spontanous Breathing and Patient connected to nasal cannula oxygen  Post-op Assessment: Report given to RN and Post -op Vital signs reviewed and stable  Post vital signs: Reviewed and stable  Last Vitals:  Filed Vitals:   09/06/14 0712  BP: 129/88  Pulse: 70  Temp: 36.7 C  Resp: 20    Complications: No apparent anesthesia complications

## 2014-09-06 NOTE — Evaluation (Signed)
Physical Therapy Evaluation Patient Details Name: Stephen Burch MRN: [id number] DOB: Aug 22, 1955 Today's Date: 09/06/2014   History of Present Illness  Pt is a 59 y.o. male s/p OPEN EXCISION BAKER'S CYST RIGHT KNEE .  Clinical Impression  Patient is s/p above surgery resulting in functional limitations due to the deficits listed below (see PT Problem List). Awaiting MD clarification regarding ROM restrictions prior to increasing exercises. Patient will benefit from skilled PT to increase their independence and safety with mobility to allow discharge to the venue listed below.       Follow Up Recommendations Outpatient PT;Supervision/Assistance - 24 hour    Equipment Recommendations  None recommended by PT    Recommendations for Other Services       Precautions / Restrictions Precautions Precautions: None Required Braces or Orthoses: Knee Immobilizer - Right Knee Immobilizer - Right: Other (comment) (no orders regarding KI ) Restrictions Weight Bearing Restrictions: Yes RLE Weight Bearing: Weight bearing as tolerated      Mobility  Bed Mobility Overal bed mobility: Needs Assistance Bed Mobility: Supine to Sit     Supine to sit: Modified independent (Device/Increase time)     General bed mobility comments: pt able to swing Rt LE off EOB; no c/o dizziness  Transfers Overall transfer level: Needs assistance Equipment used: Rolling walker (2 wheeled) Transfers: Sit to/from Stand Sit to Stand: Supervision         General transfer comment: cues for hand placement and safety with RW  Ambulation/Gait Ambulation/Gait assistance: Min guard Ambulation Distance (Feet): 30 Feet Assistive device: Rolling walker (2 wheeled) Gait Pattern/deviations: Step-through pattern;Decreased stride length Gait velocity: decr Gait velocity interpretation: Below normal speed for age/gender General Gait Details: guarded gt pattern due to pain; pt mobilizing around room with min guard (A) to  steady; no buckling of Rt Knee noted  Stairs            Wheelchair Mobility    Modified Rankin (Stroke Patients Only)       Balance Overall balance assessment: Needs assistance Sitting-balance support: Feet supported;No upper extremity supported Sitting balance-Leahy Scale: Poor Sitting balance - Comments: sat EOB ~5 min; no c/o dizziness   Standing balance support: During functional activity;Bilateral upper extremity supported Standing balance-Leahy Scale: Poor Standing balance comment: RW to balance                              Pertinent Vitals/Pain Pain Assessment: 0-10 Pain Score: 2  Pain Location: Rt knee  Pain Descriptors / Indicators: Aching Pain Intervention(s): Monitored during session;Premedicated before session;Repositioned    Home Living Family/patient expects to be discharged to:: Private residence Living Arrangements: Spouse/significant other Available Help at Discharge: Family;Available 24 hours/day Type of Home: House Home Access: Stairs to enter Entrance Stairs-Rails: None Entrance Stairs-Number of Steps: 2 Home Layout: One level Home Equipment: Walker - 2 wheels;Cane - single point;Wheelchair - manual      Prior Function Level of Independence: Independent               Hand Dominance        Extremity/Trunk Assessment   Upper Extremity Assessment: Defer to OT evaluation           Lower Extremity Assessment: RLE deficits/detail RLE Deficits / Details: maintained KI; no orders regarding ROM restrictions at this time; pt was able to perform APs and SLRx5    Cervical / Trunk Assessment: Normal  Communication   Communication: No  difficulties  Cognition Arousal/Alertness: Awake/alert Behavior During Therapy: WFL for tasks assessed/performed Overall Cognitive Status: Within Functional Limits for tasks assessed                      General Comments General comments (skin integrity, edema, etc.): encouraged  OOB activity with nursing     Exercises General Exercises - Lower Extremity Ankle Circles/Pumps: AROM;Both;10 reps Long Arc Quad: AROM;Right;5 reps;Supine      Assessment/Plan    PT Assessment Patient needs continued PT services  PT Diagnosis Abnormality of gait;Generalized weakness;Acute pain   PT Problem List Decreased strength;Decreased range of motion;Decreased activity tolerance;Decreased balance;Decreased mobility;Decreased knowledge of use of DME;Pain  PT Treatment Interventions DME instruction;Gait training;Stair training;Functional mobility training;Therapeutic activities;Therapeutic exercise;Neuromuscular re-education;Balance training;Patient/family education   PT Goals (Current goals can be found in the Care Plan section) Acute Rehab PT Goals Patient Stated Goal: to go home tomorrow PT Goal Formulation: With patient Time For Goal Achievement: 09/09/14 Potential to Achieve Goals: Good    Frequency Min 4X/week   Barriers to discharge        Co-evaluation               End of Session Equipment Utilized During Treatment: Gait belt;Right knee immobilizer Activity Tolerance: Patient tolerated treatment well Patient left: in chair;with call bell/phone within reach Nurse Communication: Mobility status;Precautions;Weight bearing status         Time: 1420-1443 PT Time Calculation (min) (ACUTE ONLY): 23 min   Charges:   PT Evaluation $Initial PT Evaluation Tier I: 1 Procedure PT Treatments $Gait Training: 8-22 mins   PT G CodesDonell Sievert, [place]  [phone] 09/06/2014, 4:14 PM

## 2014-09-06 NOTE — Anesthesia Procedure Notes (Signed)
Procedure Name: Intubation Date/Time: 09/06/2014 8:41 AM Performed by: Trixie Deis A Pre-anesthesia Checklist: Patient identified, Emergency Drugs available, Suction available, Timeout performed and Patient being monitored Patient Re-evaluated:Patient Re-evaluated prior to inductionOxygen Delivery Method: Circle system utilized Preoxygenation: Pre-oxygenation with 100% oxygen Intubation Type: IV induction Ventilation: Mask ventilation without difficulty and Oral airway inserted - appropriate to patient size Laryngoscope Size: Mac and 4 Grade View: Grade I Tube type: Oral Tube size: 7.5 mm Number of attempts: 1 Airway Equipment and Method: Stylet Placement Confirmation: positive ETCO2,  ETT inserted through vocal cords under direct vision and breath sounds checked- equal and bilateral Secured at: 23 cm Tube secured with: Tape Dental Injury: Teeth and Oropharynx as per pre-operative assessment

## 2014-09-06 NOTE — Progress Notes (Signed)
Orthopedic Tech Progress Note Patient Details:  Stephen Burch 06-Mar-1956 [id number] Bone foam provided in PACU Ortho Devices Type of Ortho Device: Other (comment) Ortho Device/Splint Location: Bone Foam Ortho Device/Splint Interventions: Ordered   [place] R Thompson 09/06/2014, 10:50 AM

## 2014-09-07 ENCOUNTER — Encounter (HOSPITAL_COMMUNITY): Payer: Self-pay | Admitting: Orthopedic Surgery

## 2014-09-07 LAB — BASIC METABOLIC PANEL
Anion gap: 5 (ref 5–15)
BUN: <5 mg/dL — ABNORMAL LOW (ref 6–23)
CHLORIDE: 105 mmol/L (ref 96–112)
CO2: 31 mmol/L (ref 19–32)
Calcium: 8.2 mg/dL — ABNORMAL LOW (ref 8.4–10.5)
Creatinine, Ser: 1.05 mg/dL (ref 0.50–1.35)
GFR calc non Af Amer: 75 mL/min — ABNORMAL LOW (ref >90)
GFR, EST AFRICAN AMERICAN: 87 mL/min — AB (ref >90)
Glucose, Bld: 122 mg/dL — ABNORMAL HIGH (ref 70–99)
POTASSIUM: 3.7 mmol/L (ref 3.5–5.1)
SODIUM: 141 mmol/L (ref 135–145)

## 2014-09-07 LAB — CBC
HCT: 42.9 % (ref 39.0–52.0)
Hemoglobin: 14.5 g/dL (ref 13.0–17.0)
MCH: 30.3 pg (ref 26.0–34.0)
MCHC: 33.8 g/dL (ref 30.0–36.0)
MCV: 89.6 fL (ref 78.0–100.0)
Platelets: 79 10*3/uL — ABNORMAL LOW (ref 150–400)
RBC: 4.79 MIL/uL (ref 4.22–5.81)
RDW: 13.5 % (ref 11.5–15.5)
WBC: 3.4 10*3/uL — ABNORMAL LOW (ref 4.0–10.5)

## 2014-09-07 MED ORDER — OXYCODONE HCL 5 MG PO TABS: 5.0000 mg | ORAL_TABLET | ORAL | Status: DC | PRN

## 2014-09-07 NOTE — Progress Notes (Signed)
CARE MANAGEMENT NOTE 09/07/2014  Patient:  Stephen Burch, Stephen Burch   Account Number:  [id number]  Date Initiated:  09/07/2014  Documentation initiated by:  [facility]  Subjective/Objective Assessment:   s/p open excision of Baker cyst     Action/Plan:   PT evals- recommended outpatient PT   Anticipated DC Date:  09/07/2014   Anticipated DC Plan:  [place] [facility]  CM consult      Choice offered to / List presented to:             Status of service:  Completed, signed off  Discharge Disposition:  HOME/SELF CARE  Per UR Regulation:  Reviewed for med. necessity/level of care/duration of stay

## 2014-09-07 NOTE — Op Note (Signed)
Dictation Number:  [id number]

## 2014-09-07 NOTE — Progress Notes (Signed)
SPORTS MEDICINE AND JOINT REPLACEMENT  Lara Mulch, MD   Carlynn Spry, PA-C [place], [place], [place]  [address]                             [phone]   PROGRESS NOTE  Subjective:  negative for Chest Pain  negative for Shortness of Breath  negative for Nausea/Vomiting   negative for Calf Pain  negative for Bowel Movement   Tolerating Diet: yes         Patient reports pain as 2 on 0-10 scale.    Objective: Vital signs in last 24 hours:   Patient Vitals for the past 24 hrs:  BP Temp Temp src Pulse Resp SpO2  09/07/14 0539 (!) 106/58 mmHg 97.8 F (36.6 C) Oral 65 16 98 %  09/07/14 0400 - - - - 16 95 %  09/07/14 0000 - - - - 16 95 %  09/06/14 2000 - - - - 16 95 %  09/06/14 1949 97/61 mmHg 98.1 F (36.7 C) Oral 68 16 95 %  09/06/14 1100 126/66 mmHg 97.7 F (36.5 C) - 62 17 100 %  09/06/14 1045 - - - 62 15 99 %  09/06/14 1035 - 97.7 F (36.5 C) - 69 14 98 %    @flow {1959:LAST@   Intake/Output from previous day:   03/07 0701 - 03/08 0700 In: 1540 [P.O.:240; I.V.:1300] Out: 1850 [Urine:1850]   Intake/Output this shift:       Intake/Output      03/07 0701 - 03/08 0700 03/08 0701 - 03/09 0700   P.O. 240    I.V. (mL/kg) 1300 (13)    Total Intake(mL/kg) 1540 (15.4)    Urine (mL/kg/hr) 1850 (0.8)    Total Output 1850     Net -310             LABORATORY DATA:  Recent Labs  09/01/14 0831 09/07/14 0525  WBC 3.4* 3.4*  HGB 16.1 14.5  HCT 46.3 42.9  PLT 105* 79*    Recent Labs  09/01/14 0831  NA 139  K 4.0  CL 106  CO2 32  BUN 9  CREATININE 1.03  GLUCOSE 114*  CALCIUM 8.8   Lab Results  Component Value Date   INR 1.00 05/13/2013   INR 1.00 09/12/2012   INR 1.08 07/04/2012    Examination:  General appearance: alert, cooperative and no distress Extremities: extremities normal, atraumatic, no cyanosis or edema and Homans sign is negative, no sign of DVT  Wound Exam: clean, dry, intact   Drainage:  None: wound tissue  dry  Motor Exam: EHL and FHL Intact  Sensory Exam: Deep Peroneal normal   Assessment:    1 Day Post-Op  Procedure(s) (LRB): OPEN EXCISION BAKER'S CYST RIGHT KNEE (Right)  ADDITIONAL DIAGNOSIS:  Active Problems:   Baker cyst    Plan: Physical Therapy as ordered Weight Bearing as Tolerated (WBAT)  DVT Prophylaxis:  None  DISCHARGE PLAN: Home           Marry Kusch 09/07/2014, 7:41 AM

## 2014-09-07 NOTE — Discharge Summary (Signed)
SPORTS MEDICINE & JOINT REPLACEMENT   Georgena Spurling, MD   Altamese Cabal, PA-C [address] [place], [place], [place]  [address]                             [phone]  PATIENT ID: Stephen Burch        MRN:  [id number]          DOB/AGE: [birth year] / 59 y.o.    DISCHARGE SUMMARY  ADMISSION DATE:    09/06/2014 DISCHARGE DATE:   09/07/2014   ADMISSION DIAGNOSIS: baker's cyst right knee    DISCHARGE DIAGNOSIS:  baker's cyst right knee    ADDITIONAL DIAGNOSIS: Active Problems:   Baker cyst  Past Medical History  Diagnosis Date  . Kidney stones   . Cirrhosis   . Asthma     as a kid  . Pneumonia     3 years ago  . GERD (gastroesophageal reflux disease)   . Arthritis     knees    PROCEDURE: Procedure(s): OPEN EXCISION BAKER'S CYST RIGHT KNEE on 09/06/2014  CONSULTS:     HISTORY:  See H&P in chart  HOSPITAL COURSE:  Stephen Burch is a 59 y.o. admitted on 09/06/2014 and found to have a diagnosis of baker's cyst right knee.  After appropriate laboratory studies were obtained  they were taken to the operating room on 09/06/2014 and underwent Procedure(s): OPEN EXCISION BAKER'S CYST RIGHT KNEE.   They were given perioperative antibiotics:  Anti-infectives    Start     Dose/Rate Route Frequency Ordered Stop   09/06/14 2000  vancomycin (VANCOCIN) IVPB 1000 mg/200 mL premix     1,000 mg 200 mL/hr over 60 Minutes Intravenous Every 12 hours 09/06/14 1153 09/06/14 2120   09/06/14 0600  vancomycin (VANCOCIN) IVPB 1000 mg/200 mL premix     1,000 mg 200 mL/hr over 60 Minutes Intravenous On call to O.R. 09/05/14 1450 09/06/14 0925    .  Tolerated the procedure well.  Placed with a foley intraoperatively.  Given Ofirmev at induction and for 48 hours.    POD# 1: Vital signs were stable.  Patient denied Chest pain, shortness of breath, or calf pain.   Consults to PT, OT, and care management were made.  The patient was weight bearing as tolerated.    Incentive spirometry was taught.  Dressing  was changed.          The remainder of the hospital course was dedicated to ambulation and strengthening.   The patient was discharged on 1 Day Post-Op in  Good condition.  Blood products given:none  DIAGNOSTIC STUDIES: Recent vital signs: Patient Vitals for the past 24 hrs:  BP Temp Temp src Pulse Resp SpO2  09/07/14 0539 (!) 106/58 mmHg 97.8 F (36.6 C) Oral 65 16 98 %  09/07/14 0400 - - - - 16 95 %  09/07/14 0000 - - - - 16 95 %  09/06/14 2000 - - - - 16 95 %  09/06/14 1949 97/61 mmHg 98.1 F (36.7 C) Oral 68 16 95 %       Recent laboratory studies:  Recent Labs  09/01/14 0831 09/07/14 0525  WBC 3.4* 3.4*  HGB 16.1 14.5  HCT 46.3 42.9  PLT 105* 79*    Recent Labs  09/01/14 0831 09/07/14 0525  NA 139 141  K 4.0 3.7  CL 106 105  CO2 32 31  BUN 9 <5*  CREATININE  1.03 1.05  GLUCOSE 114* 122*  CALCIUM 8.8 8.2*   Lab Results  Component Value Date   INR 1.00 05/13/2013   INR 1.00 09/12/2012   INR 1.08 07/04/2012     Recent Radiographic Studies :  [place] Extrem Low Right Ltd  08/18/2014   CLINICAL DATA:  Baker's cyst.  History of knee replacement.  EXAM: ULTRASOUND RIGHT LOWER EXTREMITY LIMITED  TECHNIQUE: Ultrasound examination of the lower extremity soft tissues was performed in the area of clinical concern.  COMPARISON:  None.  FINDINGS: RIGHT popliteal fossa of cysts is present compatible with Baker cyst. This measures 54 mm x 28 mm x 56 mm. The contents of the Baker's cyst are overall simple, with some low level internal echoes which are typically seen in Baker cyst.  IMPRESSION: RIGHT popliteal fossa of Baker's cyst.   Electronically Signed   By: Andreas Newport M.D.   On: 08/18/2014 14:05    DISCHARGE INSTRUCTIONS: Discharge Instructions    Call MD / Call 911    Complete by:  As directed   If you experience chest pain or shortness of breath, CALL 911 and be transported to the hospital emergency room.  If you develope a fever above 101 F, pus (white  drainage) or increased drainage or redness at the wound, or calf pain, call your surgeon's office.     Constipation Prevention    Complete by:  As directed   Drink plenty of fluids.  Prune juice may be helpful.  You may use a stool softener, such as Colace (over the counter) 100 mg twice a day.  Use MiraLax (over the counter) for constipation as needed.     Diet - low sodium heart healthy    Complete by:  As directed      Increase activity slowly as tolerated    Complete by:  As directed      Weight bearing as tolerated    Complete by:  As directed            DISCHARGE MEDICATIONS:     Medication List    TAKE these medications        Garcinia Cambogia-Chromium 500-200 MG-MCG Tabs  Take 2 tablets by mouth daily.     Milk Thistle 1000 MG Caps  Take 1,000 mg by mouth daily.     oxyCODONE 5 MG immediate release tablet  Commonly known as:  Oxy IR/ROXICODONE  Take 1-2 tablets (5-10 mg total) by mouth every 4 (four) hours as needed for breakthrough pain.     pantoprazole 40 MG tablet  Commonly known as:  PROTONIX  Take 1 tablet (40 mg total) by mouth daily.     vitamin C 500 MG tablet  Commonly known as:  ASCORBIC ACID  Take 1 tablet (500 mg total) by mouth daily.        FOLLOW UP VISIT:       Follow-up Information    Follow up with Raymon Mutton, MD. Call on 09/09/2014.   Specialty:  Orthopedic Surgery   Contact information:   [address] [place] [address] [phone]       DISPOSITION: HOME   CONDITION:  Good   Lional Icenogle 09/07/2014, 3:22 PM

## 2014-09-07 NOTE — Discharge Instructions (Signed)
Diet: As you were doing prior to hospitalization   Activity:  Increase activity slowly as tolerated                  No lifting or driving for 6 weeks  Shower:  May shower without a dressing once there is no drainage from your wound.                 Do NOT wash over the wound.                 Dressing:  You may change your dressing on Wednesday                    Then change the dressing daily with sterile 4"x4"s gauze dressing                     And TED hose for knees.  Weight Bearing:  Weight bearing as tolerated as taught in physical therapy.  Use a                                walker or Crutches as instructed.  To prevent constipation: you may use a stool softener such as -               Colace ( over the counter) 100 mg by mouth twice a day                Drink plenty of fluids ( prune juice may be helpful) and high fiber foods                Miralax ( over the counter) for constipation as needed.    Precautions:  If you experience chest pain or shortness of breath - call 911 immediately               For transfer to the hospital emergency department!!               If you develop a fever greater that 101 F, purulent drainage from wound,                             increased redness or drainage from wound, or calf pain -- Call the office.  Follow- Up Appointment:  Please call for an appointment to be seen on 09/09/14                                              [facility] office:  [phone]            [address] [place], [place] [address]

## 2014-09-08 NOTE — Op Note (Signed)
NAMETORY, SEPTER                ACCOUNT NO.:  [id number]  MEDICAL RECORD NO.:  [id number]  LOCATION:  5N28C                        FACILITY:  [facility]  PHYSICIAN:  Estill Bamberg. Ronnie Derby, M.D. DATE OF BIRTH:  [birth year]  DATE OF PROCEDURE:  09/06/2014 DATE OF DISCHARGE:  09/07/2014                              OPERATIVE REPORT   SURGEON:  Estill Bamberg. Ronnie Derby, M.D.  ASSISTANT:  Nehemiah Massed, PA-C.  ANESTHESIA:  General.  PREOPERATIVE DIAGNOSIS:  Right knee Baker cyst.  POSTOPERATIVE DIAGNOSIS:  Right knee Baker cyst.  PROCEDURE:  Right open Baker cyst removal.  INDICATION FOR PROCEDURE:  The patient is a 59 year old, status post a knee replacement by another Psychologist, sport and exercise.  I had aspirated this Baker cyst, no less than dozen times.  He consented for open debridement.  Informed consent was obtained.  DESCRIPTION OF PROCEDURE:  The patient was laid supine, administered general anesthesia, then placed in the prone position, well padded.  The extremity was prepped and draped with a tourniquet applied.  I then exsanguinated the extremity after sterile prep and drape, and inflated the tourniquet to 300 mmHg.  I made a midline incision approximately 6 cm in length with a #15 blade.  I then used careful dissection through the soft tissues identifying the Baker cyst.  I then used blunt dissection to shell it out.  It was piercing through the posteromedial aspect of the joint longitudinally with the soleus muscle.  Took about 20-30 minutes to dissect down to the joint to get down through the stalk.  I then ellipsed the Baker cyst out that measured approximately 10 x 10 cm.  I then irrigated and closed what I felt to be the mouth of the cyst with a #2-0 Vicryl suture.  I then lavaged the tourniquet down to obtain hemostasis and then closed with 2-0 Vicryl, 4-0 Monocryl, Steri-Strips, a bulky dressing, and a 6-inch Ace wrap.  Tourniquet time was 28 minutes.  COMPLICATIONS:  None.  DRAINS:   None.          ______________________________ Estill Bamberg. Ronnie Derby, M.D.     SDL/MEDQ  D:  09/07/2014  T:  09/08/2014  Job:  [id number]

## 2014-09-18 ENCOUNTER — Emergency Department (HOSPITAL_COMMUNITY): Payer: Medicare Other

## 2014-09-18 ENCOUNTER — Emergency Department (HOSPITAL_COMMUNITY)
Admission: EM | Admit: 2014-09-18 | Discharge: 2014-09-18 | Disposition: A | Discharge status: Home / Self Care | Payer: Medicare Other | Attending: Emergency Medicine | Admitting: Emergency Medicine

## 2014-09-18 ENCOUNTER — Encounter (HOSPITAL_COMMUNITY): Payer: Self-pay

## 2014-09-18 DIAGNOSIS — Z79899 Other long term (current) drug therapy: Secondary | ICD-10-CM | POA: Insufficient documentation

## 2014-09-18 DIAGNOSIS — R030 Elevated blood-pressure reading, without diagnosis of hypertension: Secondary | ICD-10-CM | POA: Diagnosis present

## 2014-09-18 DIAGNOSIS — Z87891 Personal history of nicotine dependence: Secondary | ICD-10-CM | POA: Insufficient documentation

## 2014-09-18 DIAGNOSIS — Z8701 Personal history of pneumonia (recurrent): Secondary | ICD-10-CM | POA: Diagnosis not present

## 2014-09-18 DIAGNOSIS — K219 Gastro-esophageal reflux disease without esophagitis: Secondary | ICD-10-CM | POA: Insufficient documentation

## 2014-09-18 DIAGNOSIS — J45909 Unspecified asthma, uncomplicated: Secondary | ICD-10-CM | POA: Diagnosis not present

## 2014-09-18 DIAGNOSIS — Z880 Allergy status to penicillin: Secondary | ICD-10-CM | POA: Diagnosis not present

## 2014-09-18 DIAGNOSIS — M25512 Pain in left shoulder: Secondary | ICD-10-CM | POA: Insufficient documentation

## 2014-09-18 MED ORDER — KETOROLAC TROMETHAMINE 60 MG/2ML IM SOLN
60.0000 mg | Freq: Once | INTRAMUSCULAR | Status: AC
  Administered 2014-09-18: 60 mg via INTRAMUSCULAR
  Filled 2014-09-18: qty 2

## 2014-09-18 NOTE — ED Notes (Signed)
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  Pt checked his blood pressure at home and it was 190/94

## 2014-09-18 NOTE — ED Provider Notes (Signed)
CSN: [id number]     Arrival date & time 09/18/14  0441 History   First MD Initiated Contact with Patient 09/18/14 [phone]     Chief Complaint  Patient presents with  . Hypertension     (Consider location/radiation/quality/duration/timing/severity/associated sxs/prior Treatment) HPI Comments: Patient is a 59 year old male with history of cirrhosis, asthma, GERD, and recent surgery for a Baker's cyst. He presents today for evaluation of pain in his left shoulder which started yesterday evening after working with a grinding machine. His blood pressure was elevated at home and he presents for evaluation of this. He denies any chest pain or shortness of breath. He reports to me that the same thing happened with his right shoulder several days ago and seemed to resolve with time.  Patient is a 59 y.o. male presenting with hypertension. The history is provided by the patient.  Hypertension This is a new problem. Episode onset: 9 hours ago. The problem occurs constantly. The problem has been rapidly worsening. Pertinent negatives include no chest pain and no shortness of breath. Exacerbated by: Movement, position, and palpation. Nothing relieves the symptoms. He has tried nothing for the symptoms. The treatment provided no relief.    Past Medical History  Diagnosis Date  . Cirrhosis   . Asthma     as a kid  . Pneumonia     3 years ago  . GERD (gastroesophageal reflux disease)   . Arthritis     knees   Past Surgical History  Procedure Laterality Date  . Replacement total knee      Bilateral knee replacement, 2010, 2012  . Esophagogastroduodenoscopy N/A 09/17/2012    Procedure: ESOPHAGOGASTRODUODENOSCOPY (EGD);  Surgeon: Rogene Houston, MD;  [facility];  Service: Endoscopy;  Laterality: N/A;  200  . Hernia repair Bilateral     x 2  . Tonsillectomy    . Inguinal hernia repair Bilateral 05/22/2013    Procedure: REPAIR OF RECURRENT INCARCERATED INGUINAL HERNIA WITH MESH RIGHT SIDE,   REPAIR OF RECURRENT INGUINAL HERNIA WITH MESH LEFT SIDE;  Surgeon: [name], MD;  Location: [facility];  Service: General;  Laterality: Bilateral;  . Insertion of mesh Bilateral 05/22/2013    Procedure: INSERTION OF MESH;  Surgeon: [name], MD;  Location: [facility];  Service: General;  Laterality: Bilateral;  . Joint replacement    . Ear cyst excision Right 09/06/2014    Procedure: OPEN EXCISION BAKER'S CYST RIGHT KNEE;  Surgeon: Vickey Huger, MD;  Location: [facility];  Service: Orthopedics;  Laterality: Right;   Family History  Problem Relation Age of Onset  . Colon cancer Neg Hx   . Colon polyps Neg Hx    History  Substance Use Topics  . Smoking status: Former Smoker -- 0.25 packs/day for 3 years    Types: Cigarettes  . Smokeless tobacco: Never Used     Comment: none since age 28-1 pack per week when he was smoking   . Alcohol Use: No     Comment: rarely.  Last beer 6 months. Was not a heavy drinker.    Review of Systems  Respiratory: Negative for shortness of breath.   Cardiovascular: Negative for chest pain.  All other systems reviewed and are negative.     Allergies  Aspirin and Penicillins  Home Medications   Prior to Admission medications   Medication Sig Start Date End Date Taking? Authorizing Provider  Milk Thistle 1000 MG CAPS Take 1,000 mg by mouth daily.  Yes Historical Provider, MD  oxyCODONE (OXY IR/ROXICODONE) 5 MG immediate release tablet Take 1-2 tablets (5-10 mg total) by mouth every 4 (four) hours as needed for breakthrough pain. 09/07/14  Yes Carlynn Spry, PA-C  pantoprazole (PROTONIX) 40 MG tablet Take 1 tablet (40 mg total) by mouth daily. 11/10/13  Yes Butch Penny, NP  vitamin C (ASCORBIC ACID) 500 MG tablet Take 1 tablet (500 mg total) by mouth daily. 02/23/13  Yes Rogene Houston, MD  Garcinia Cambogia-Chromium 500-200 MG-MCG TABS Take 2 tablets by mouth daily.    Historical Provider, MD   BP 137/87 mmHg  Pulse 76  Temp(Src) 98.8 F (37.1 C)   Resp 20  Ht 6' (1.829 m)  Wt 218 lb (98.884 kg)  BMI 29.56 kg/m2  SpO2 96% Physical Exam  Constitutional: He is oriented to person, place, and time. He appears well-developed and well-nourished. No distress.  HENT:  Head: Normocephalic and atraumatic.  Neck: Normal range of motion. Neck supple.  Cardiovascular: Normal rate, regular rhythm and normal heart sounds.   No murmur heard. Pulmonary/Chest: Effort normal and breath sounds normal. No respiratory distress. He has no wheezes.  Abdominal: Soft. Bowel sounds are normal. He exhibits no distension. There is no tenderness.  Musculoskeletal:  The left shoulder appears grossly normal. There is no redness, swelling, or other abnormality. There is tenderness to palpation to the lateral aspect of the left deltoid. There is pain with any range of motion. Ulnar and radial pulses are easily palpable. He is able to flex, extend, and oppose all fingers without difficulty.  Neurological: He is alert and oriented to person, place, and time.  Skin: Skin is warm and dry. He is not diaphoretic.  Nursing note and vitals reviewed.   ED Course  Procedures (including critical care time) Labs Review Labs Reviewed - No data to display  Imaging Review No results found.   EKG Interpretation   Date/Time:  Saturday September 18 2014 05:05:08 EDT Ventricular Rate:  67 PR Interval:  149 QRS Duration: 97 QT Interval:  374 QTC Calculation: 395 R Axis:   6 Text Interpretation:  Sinus rhythm Baseline wander in lead(s) V5 Otherwise  normal ECG Confirmed by DELOS  MD, Jasir Rother ([id number]) on 09/18/2014 5:07:51 AM      MDM   Final diagnoses:  Left shoulder pain    Patient presents with complaints of left shoulder pain. 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 His discomfort is very musculoskeletal in nature. He is exquisitely tender over the lateral left shoulder. He has significant discomfort with  any movement that reproduces his pain. His EKG is normal and x-ray reveals no acute abnormality. I am certain this is not cardiac and do not feel as though any workup is indicated into this. He will be treated with rest, immobilization, NSAIDs, and when necessary return.    Veryl Speak, MD 09/18/14 [phone]

## 2014-09-18 NOTE — Discharge Instructions (Signed)
Wear shoulder sling as applied for the next 5 days.  Ibuprofen 600 mg 3 times daily for the next 5 days.  All up with your primary Dr. if not improving in the next week, and return to the emergency department if your symptoms substantially worsen or change.   Shoulder Pain The shoulder is the joint that connects your arms to your body. The bones that form the shoulder joint include the upper arm bone (humerus), the shoulder blade (scapula), and the collarbone (clavicle). The top of the humerus is shaped like a ball and fits into a rather flat socket on the scapula (glenoid cavity). A combination of muscles and strong, fibrous tissues that connect muscles to bones (tendons) support your shoulder joint and hold the ball in the socket. Small, fluid-filled sacs (bursae) are located in different areas of the joint. They act as cushions between the bones and the overlying soft tissues and help reduce friction between the gliding tendons and the bone as you move your arm. Your shoulder joint allows a wide range of motion in your arm. This range of motion allows you to do things like scratch your back or throw a ball. However, this range of motion also makes your shoulder more prone to pain from overuse and injury. Causes of shoulder pain can originate from both injury and overuse and usually can be grouped in the following four categories:  Redness, swelling, and pain (inflammation) of the tendon (tendinitis) or the bursae (bursitis).  Instability, such as a dislocation of the joint.  Inflammation of the joint (arthritis).  Broken bone (fracture). HOME CARE INSTRUCTIONS   Apply ice to the sore area.  Put ice in a plastic bag.  Place a towel between your skin and the bag.  Leave the ice on for 15-20 minutes, 3-4 times per day for the first 2 days, or as directed by your health care provider.  Stop using cold packs if they do not help with the pain.  If you have a shoulder sling or immobilizer,  wear it as long as your caregiver instructs. Only remove it to shower or bathe. Move your arm as little as possible, but keep your hand moving to prevent swelling.  Squeeze a soft ball or foam pad as much as possible to help prevent swelling.  Only take over-the-counter or prescription medicines for pain, discomfort, or fever as directed by your caregiver. SEEK MEDICAL CARE IF:   Your shoulder pain increases, or new pain develops in your arm, hand, or fingers.  Your hand or fingers become cold and numb.  Your pain is not relieved with medicines. SEEK IMMEDIATE MEDICAL CARE IF:   Your arm, hand, or fingers are numb or tingling.  Your arm, hand, or fingers are significantly swollen or turn white or blue. MAKE SURE YOU:   Understand these instructions.  Will watch your condition.  Will get help right away if you are not doing well or get worse. Document Released: 03/28/2005 Document Revised: 11/02/2013 Document Reviewed: 06/02/2011 [facility] Patient Information 2015 [place], [place]. This information is not intended to replace advice given to you by your health care provider. Make sure you discuss any questions you have with your health care provider.

## 2014-09-24 ENCOUNTER — Telehealth (HOSPITAL_COMMUNITY): Payer: Self-pay

## 2014-09-24 NOTE — Telephone Encounter (Signed)
3/25 called to schedule and first available appt was April 7.  Patient said that was a waste of time because he would be back at work by then.  Nothing was scheduled

## 2014-09-27 ENCOUNTER — Encounter (INDEPENDENT_AMBULATORY_CARE_PROVIDER_SITE_OTHER): Payer: Self-pay | Admitting: *Deleted

## 2014-11-08 ENCOUNTER — Other Ambulatory Visit (INDEPENDENT_AMBULATORY_CARE_PROVIDER_SITE_OTHER): Payer: Self-pay | Admitting: Internal Medicine

## 2014-11-08 DIAGNOSIS — K7469 Other cirrhosis of liver: Secondary | ICD-10-CM

## 2014-11-17 ENCOUNTER — Ambulatory Visit (HOSPITAL_COMMUNITY)
Admission: RE | Admit: 2014-11-17 | Discharge: 2014-11-17 | Disposition: A | Discharge status: Home / Self Care | Payer: Medicare Other | Source: Ambulatory Visit | Attending: Internal Medicine | Admitting: Internal Medicine

## 2014-11-17 DIAGNOSIS — K7469 Other cirrhosis of liver: Secondary | ICD-10-CM | POA: Insufficient documentation

## 2014-11-22 ENCOUNTER — Telehealth (INDEPENDENT_AMBULATORY_CARE_PROVIDER_SITE_OTHER): Payer: Self-pay | Admitting: *Deleted

## 2014-11-22 DIAGNOSIS — K7469 Other cirrhosis of liver: Secondary | ICD-10-CM

## 2014-11-22 NOTE — Telephone Encounter (Signed)
Per Dr.Rehman the patient will need to have labs drawn the week of May 30,2016.

## 2014-12-01 LAB — AFP TUMOR MARKER: AFP-Tumor Marker: 2.7 ng/mL (ref <6.1)

## 2014-12-06 ENCOUNTER — Other Ambulatory Visit (INDEPENDENT_AMBULATORY_CARE_PROVIDER_SITE_OTHER): Payer: Self-pay | Admitting: Internal Medicine

## 2014-12-06 DIAGNOSIS — R935 Abnormal findings on diagnostic imaging of other abdominal regions, including retroperitoneum: Secondary | ICD-10-CM

## 2014-12-06 DIAGNOSIS — K7469 Other cirrhosis of liver: Secondary | ICD-10-CM

## 2014-12-15 ENCOUNTER — Ambulatory Visit (HOSPITAL_COMMUNITY)
Admission: RE | Admit: 2014-12-15 | Discharge: 2014-12-15 | Disposition: A | Discharge status: Home / Self Care | Payer: Medicare Other | Source: Ambulatory Visit | Attending: Internal Medicine | Admitting: Internal Medicine

## 2014-12-15 DIAGNOSIS — K7469 Other cirrhosis of liver: Secondary | ICD-10-CM | POA: Diagnosis present

## 2014-12-15 DIAGNOSIS — R935 Abnormal findings on diagnostic imaging of other abdominal regions, including retroperitoneum: Secondary | ICD-10-CM | POA: Diagnosis not present

## 2014-12-15 LAB — POCT I-STAT CREATININE: CREATININE: 1.1 mg/dL (ref 0.61–1.24)

## 2014-12-15 MED ORDER — GADOXETATE DISODIUM 0.25 MMOL/ML IV SOLN
10.0000 mL | Freq: Once | INTRAVENOUS | Status: AC | PRN
  Administered 2014-12-15: 10 mL via INTRAVENOUS

## 2014-12-21 ENCOUNTER — Ambulatory Visit (INDEPENDENT_AMBULATORY_CARE_PROVIDER_SITE_OTHER): Payer: Medicare Other | Admitting: Internal Medicine

## 2014-12-21 ENCOUNTER — Encounter (INDEPENDENT_AMBULATORY_CARE_PROVIDER_SITE_OTHER): Payer: Self-pay | Admitting: Internal Medicine

## 2014-12-21 VITALS — BP 110/82 | HR 68 | Temp 98.4°F | Resp 18 | Ht 72.0 in | Wt 216.3 lb

## 2014-12-21 DIAGNOSIS — K7460 Unspecified cirrhosis of liver: Secondary | ICD-10-CM

## 2014-12-21 DIAGNOSIS — K219 Gastro-esophageal reflux disease without esophagitis: Secondary | ICD-10-CM

## 2014-12-21 DIAGNOSIS — R932 Abnormal findings on diagnostic imaging of liver and biliary tract: Secondary | ICD-10-CM | POA: Diagnosis not present

## 2014-12-21 DIAGNOSIS — R140 Abdominal distension (gaseous): Secondary | ICD-10-CM

## 2014-12-21 MED ORDER — DICYCLOMINE HCL 20 MG PO TABS: 10.0000 mg | ORAL_TABLET | Freq: Three times a day (TID) | ORAL | Status: DC

## 2014-12-21 MED ORDER — DICYCLOMINE HCL 20 MG PO TABS: 20.0000 mg | ORAL_TABLET | Freq: Three times a day (TID) | ORAL | Status: DC

## 2014-12-21 MED ORDER — PANTOPRAZOLE SODIUM 40 MG PO TBEC: 40.0000 mg | DELAYED_RELEASE_TABLET | Freq: Every day | ORAL | Status: DC

## 2014-12-21 NOTE — Progress Notes (Signed)
Presenting complaint;  Patient is here to discuss results of liver MRI. He complains of bloating after each meal.  Subjective:  Patient is here to discuss results of recent MRI. He had ultrasound for this surveillance purposes and noted to have focal abnormality. MRI revealed 2 hyperenhancing lesions in the right lobe of liver posteriorly. He is complaining of postprandial bloating which occurs after meals. He also has have a bowel movement. He denies abdominal pain and urgency melena or rectal bleeding. He has good appetite. Ultrasound suggested gallbladder polyp but no stones.   Current Medications: Outpatient Encounter Prescriptions as of 12/21/2014  Medication Sig  . calcium carbonate (TUMS - DOSED IN MG ELEMENTAL CALCIUM) 500 MG chewable tablet Chew 1 tablet by mouth. Prn  . pantoprazole (PROTONIX) 40 MG tablet Take 1 tablet (40 mg total) by mouth daily.  . [DISCONTINUED] Garcinia Cambogia-Chromium 500-200 MG-MCG TABS Take 2 tablets by mouth daily.  . [DISCONTINUED] Milk Thistle 1000 MG CAPS Take 1,000 mg by mouth daily.  . [DISCONTINUED] oxyCODONE (OXY IR/ROXICODONE) 5 MG immediate release tablet Take 1-2 tablets (5-10 mg total) by mouth every 4 (four) hours as needed for breakthrough pain. (Patient not taking: Reported on 12/21/2014)  . [DISCONTINUED] vitamin C (ASCORBIC ACID) 500 MG tablet Take 1 tablet (500 mg total) by mouth daily. (Patient not taking: Reported on 12/21/2014)   No facility-administered encounter medications on file as of 12/21/2014.     Objective: Blood pressure 110/82, pulse 68, temperature 98.4 F (36.9 C), temperature source Oral, resp. rate 18, height 6' (1.829 m), weight 216 lb 4.8 oz (98.113 kg). Patient is alert and in no acute distress. Conjunctiva is pink. Sclera is nonicteric Oropharyngeal mucosa is normal. No neck masses or thyromegaly noted. Cardiac exam with regular rhythm normal S1 and S2. No murmur or gallop noted. Lungs are clear to  auscultation. Abdomen is symmetrical soft and nontender. Liver edge is right costal margin. Spleen is felt not palpable. No LE edema or clubbing noted.  Labs/studies Results: EXAM: MRI ABDOMEN WITHOUT AND WITH CONTRAST  TECHNIQUE: Multiplanar multisequence MR imaging of the abdomen was performed both before and after the administration of intravenous contrast.  CONTRAST: 10 cc Eovist  COMPARISON: Ultrasound 11/17/2014.  FINDINGS: Lower chest: Unremarkable.  Hepatobiliary: No abnormality seen in the subcapsular left liver as suggest on the recent ultrasound exam. 10 mm T1 isointense, T2 isointense, hyper enhancing lesion is identified in segment 7 (see image 11 series 5004). A similar but smaller 6 mm lesion is identified more laterally in the same liver segment. On 23 minutes hepato biliary face imaging, these lesions are isointense to the background hepatic parenchyma. No other focal abnormality is seen within the liver parenchyma.  There is no evidence for gallstones, gallbladder wall thickening, or pericholecystic fluid. No intrahepatic or extrahepatic biliary dilation.  Pancreas: No focal mass lesion. No dilatation of the main duct. No intraparenchymal cyst. No peripancreatic edema.  Spleen: 18.8 cm craniocaudal length. No focal mass lesion.  Adrenals/Urinary Tract: No adrenal nodule or mass. Tiny cyst identified in the lower pole the right kidney. Left kidney is unremarkable.  Stomach/Bowel: Stomach is nondistended. No gastric wall thickening. No evidence of outlet obstruction. Duodenum is normally positioned as is the ligament of Treitz.  Vascular/Lymphatic: No abdominal aortic aneurysm. No lymphadenopathy in the abdomen.  Other: No intraperitoneal free fluid.  Musculoskeletal: No abnormal marrow signal within the visualized bony anatomy.  IMPRESSION: 1. No focal parenchymal abnormality is identified in the subcapsular aspect the anterior  left liver as suggested on the recent ultrasound exam. The patient is noted to have two tiny foci of hyper enhancement in the posterior right liver, both of which retained contrast on delayed hepatobiliary phase imaging. While imaging features are nonspecific, hepatoma at either location could certainly have this appearance.   Electronically Signed  By: Misty Stanley M.D.  On: 12/15/2014 09:22   Assessment:  #1.Two small hyper enhancing liver lesions measuring 9.8 and 6 mm and right lobe of liver concerning for small HCC but could be benign and needs to be followed up closely. AFP is normal. #2. Postprandial bloating and bowel movement possibly due to IBS. #3. Cirrhosis secondary to NAFLD. He has compensated disease.   Plan:  Dicyclomine 10 mg by mouth two or 3 times a day. Patient will call with progress report. If he does not respond to dicyclomine will proceed with HIDA scan with CCK. Office visit in 6 months at which time liver MRI would be repeated.

## 2014-12-21 NOTE — Patient Instructions (Signed)
Try dicyclomine for 2-3 weeks and if it does not help with bloating will proceed with HIDA scan with CCK

## 2014-12-22 ENCOUNTER — Telehealth (INDEPENDENT_AMBULATORY_CARE_PROVIDER_SITE_OTHER): Payer: Self-pay | Admitting: *Deleted

## 2014-12-22 DIAGNOSIS — K7469 Other cirrhosis of liver: Secondary | ICD-10-CM

## 2014-12-22 NOTE — Telephone Encounter (Signed)
Per Dr.Rehman the patient will need to have labs drawn in 6 months.

## 2015-01-13 ENCOUNTER — Encounter: Payer: Self-pay | Admitting: Family Medicine

## 2015-01-13 ENCOUNTER — Ambulatory Visit (INDEPENDENT_AMBULATORY_CARE_PROVIDER_SITE_OTHER): Payer: Medicare Other | Admitting: Family Medicine

## 2015-01-13 VITALS — BP 100/64 | HR 72 | Temp 98.0°F | Resp 16 | Ht 72.0 in | Wt 218.0 lb

## 2015-01-13 DIAGNOSIS — L259 Unspecified contact dermatitis, unspecified cause: Secondary | ICD-10-CM | POA: Diagnosis not present

## 2015-01-13 MED ORDER — METHYLPREDNISOLONE ACETATE 80 MG/ML IJ SUSP
80.0000 mg | Freq: Once | INTRAMUSCULAR | Status: AC
  Administered 2015-01-13: 80 mg via INTRAMUSCULAR

## 2015-01-13 MED ORDER — PREDNISONE 20 MG PO TABS: ORAL_TABLET | ORAL | Status: DC

## 2015-01-13 NOTE — Patient Instructions (Signed)
May use oral antihistamines like Benadryl or Claritin for itching

## 2015-01-13 NOTE — Progress Notes (Signed)
Subjective:     Patient ID: Stephen Burch, male   DOB: 05/20/1956, 59 y.o.   MRN: [id number]  HPI  Chief Complaint  Patient presents with  . Rash    Patient comes in office today with conerns of what he believes is poison oak. Patient states that he has been out in the woods and two days ago noticed rash around his hands and wrist which has now spread up to his face. Patient describes rash as very itchy and has applied calomine lotion prn  States he was cutting down limbs in his yard which were covered by poison ivy vines. Wishes an injection today.   Review of Systems     Objective:   Physical Exam  Constitutional: He appears well-developed and well-nourished. No distress.  Skin:  A few papules on his bilateral anterior forearms but has an area of confluent rash on his right forehead.       Assessment:    1. Contact dermatitis - predniSONE (DELTASONE) 20 MG tablet; Taper as follows: 3 pills for 4 days, two pills for 4 days, one pill for four days  Dispense: 24 tablet; Refill: 0 - methylPREDNISolone acetate (DEPO-MEDROL) injection 80 mg; Inject 1 mL (80 mg total) into the muscle once.    Plan:    May use oral antihistamines for itching.

## 2015-05-03 ENCOUNTER — Telehealth (INDEPENDENT_AMBULATORY_CARE_PROVIDER_SITE_OTHER): Payer: Self-pay | Admitting: *Deleted

## 2015-05-03 NOTE — Telephone Encounter (Signed)
Forward to Dr.Rehman

## 2015-05-03 NOTE — Telephone Encounter (Signed)
Stephen Burch would like to know if Dr. Laural Golden would please give him a return call at [phone]. He is wanting to know what Dr. Laural Golden thinks about him have Hernia surgery before the end of the year.

## 2015-05-03 NOTE — Telephone Encounter (Signed)
Patient will stop by at day hospital on this Thursday for me to do a quick exam before referral made or recommended.

## 2015-05-18 ENCOUNTER — Telehealth (INDEPENDENT_AMBULATORY_CARE_PROVIDER_SITE_OTHER): Payer: Self-pay | Admitting: *Deleted

## 2015-05-18 NOTE — Telephone Encounter (Signed)
[name] went to his knee doctor and he is wanting to put him on the generic brand of Cipro. It has a warring not to take if you have liver problems. He would like to know if Dr. Laural Golden thinks. The return phone number is [phone].

## 2015-05-18 NOTE — Telephone Encounter (Signed)
Per Dr.Rehman - this will be okay to take. Patient was called and made aware.

## 2015-05-25 ENCOUNTER — Other Ambulatory Visit (INDEPENDENT_AMBULATORY_CARE_PROVIDER_SITE_OTHER): Payer: Self-pay | Admitting: *Deleted

## 2015-05-25 ENCOUNTER — Encounter (INDEPENDENT_AMBULATORY_CARE_PROVIDER_SITE_OTHER): Payer: Self-pay | Admitting: *Deleted

## 2015-05-25 DIAGNOSIS — K7469 Other cirrhosis of liver: Secondary | ICD-10-CM

## 2015-06-09 ENCOUNTER — Other Ambulatory Visit: Payer: Self-pay

## 2015-06-14 ENCOUNTER — Encounter (INDEPENDENT_AMBULATORY_CARE_PROVIDER_SITE_OTHER): Payer: Self-pay | Admitting: Internal Medicine

## 2015-06-14 ENCOUNTER — Ambulatory Visit (INDEPENDENT_AMBULATORY_CARE_PROVIDER_SITE_OTHER): Payer: Medicare Other | Admitting: Internal Medicine

## 2015-06-14 VITALS — BP 118/80 | HR 68 | Temp 97.6°F | Resp 18 | Ht 72.0 in | Wt 212.9 lb

## 2015-06-14 DIAGNOSIS — K2270 Barrett's esophagus without dysplasia: Secondary | ICD-10-CM

## 2015-06-14 DIAGNOSIS — K2100 Gastro-esophageal reflux disease with esophagitis, without bleeding: Secondary | ICD-10-CM

## 2015-06-14 DIAGNOSIS — K7460 Unspecified cirrhosis of liver: Secondary | ICD-10-CM

## 2015-06-14 DIAGNOSIS — R932 Abnormal findings on diagnostic imaging of liver and biliary tract: Secondary | ICD-10-CM

## 2015-06-14 NOTE — Progress Notes (Signed)
Presenting complaint;  Follow-up for chronic liver disease.  Subjective:  Stephen Burch is 59 year old occasion male was cirrhosis secondary to NAFLD was here for scheduled visit. He was last seen on 12/21/2014. He has no complaints. He has not undergone repair for umbilicus hernia because Dr. Dalbert Batman is on leave. He is not having any pain. He walks 3 times a week. He walks at least mildly time. He also lifts weights and punching at home twice a week. He states heart tones well controlled with therapy. He denies dysphagia. He drinks 2 cups of coffee per day. He denies abdominal pain melena or rectal bleeding. He has completed hepatitis A and B vaccination.    Current Medications: Outpatient Encounter Prescriptions as of 06/14/2015  Medication Sig  . calcium carbonate (TUMS - DOSED IN MG ELEMENTAL CALCIUM) 500 MG chewable tablet Chew 1 tablet by mouth. Prn  . dicyclomine (BENTYL) 20 MG tablet Take 0.5 tablets (10 mg total) by mouth 3 (three) times daily before meals.  . pantoprazole (PROTONIX) 40 MG tablet Take 1 tablet (40 mg total) by mouth daily.  . [DISCONTINUED] predniSONE (DELTASONE) 20 MG tablet Taper as follows: 3 pills for 4 days, two pills for 4 days, one pill for four days (Patient not taking: Reported on 06/14/2015)   No facility-administered encounter medications on file as of 06/14/2015.     Objective: Blood pressure 118/80, pulse 68, temperature 97.6 F (36.4 C), temperature source Oral, resp. rate 18, height 6' (1.829 m), weight 212 lb 14.4 oz (96.571 kg).  Patient is alert and in no acute distress. Conjunctiva is pink. Sclera is nonicteric Oropharyngeal mucosa is normal. No neck masses or thyromegaly noted. Cardiac exam with regular rhythm normal S1 and S2. No murmur or gallop noted. Lungs are clear to auscultation. Abdomen is full. He has small umbilical hernia which is completely reducible. Abdomen is soft with palpable spleen tip. Liver is indistinct. Shifting dullness  is absent.  No LE edema or clubbing noted. He has hypopigmented patches of vitiligo involving right for head and back of his hands.  Labs/studies Results: He had EGD on 09/17/2012 revealing single short column of grade 1 esophageal varix mild portal gastropathy and short segment Barrett's esophagus.   MR liver on 12/15/2014 revealed two foci of hyper enhancing lesions in posterior right liver. Lesion in segment 7 was 10 mm and 6 mm lesion was in the same segment. Spleen was 18.8 cm.  Assessment:  #1. Cirrhosis secondary to NAFLD. He has well compensated hepatic function. #2. 2 small hyperenhancing lesions in right lobe noted on MRI of 12/15/2014. He is due for follow-up study. AFP was 2.7 in May 2016. #3. GERD complicated by short segment Barrett's esophagus. Heartburn is well controlled with therapy.   Plan:  Patient will go the lab for CBC, LFTs, metabolic 7 and AFP. MR of liver with and without contrast. EGD in 2017. Office visit in 6 months.

## 2015-06-14 NOTE — Patient Instructions (Signed)
Physician will call with results of blood tests and MRI when completed

## 2015-06-15 NOTE — Addendum Note (Signed)
Addended by: Rogene Houston on: 06/15/2015 10:05 AM   Modules accepted: Orders

## 2015-06-17 ENCOUNTER — Ambulatory Visit (HOSPITAL_COMMUNITY)
Admission: RE | Admit: 2015-06-17 | Discharge: 2015-06-17 | Disposition: A | Discharge status: Home / Self Care | Payer: Medicare Other | Source: Ambulatory Visit | Attending: Internal Medicine | Admitting: Internal Medicine

## 2015-06-17 DIAGNOSIS — K7460 Unspecified cirrhosis of liver: Secondary | ICD-10-CM | POA: Insufficient documentation

## 2015-06-17 DIAGNOSIS — R932 Abnormal findings on diagnostic imaging of liver and biliary tract: Secondary | ICD-10-CM | POA: Diagnosis not present

## 2015-06-17 DIAGNOSIS — K2270 Barrett's esophagus without dysplasia: Secondary | ICD-10-CM

## 2015-06-17 DIAGNOSIS — R161 Splenomegaly, not elsewhere classified: Secondary | ICD-10-CM | POA: Insufficient documentation

## 2015-06-17 LAB — POCT I-STAT CREATININE: CREATININE: 1.1 mg/dL (ref 0.61–1.24)

## 2015-06-17 MED ORDER — GADOXETATE DISODIUM 0.25 MMOL/ML IV SOLN
10.0000 mL | Freq: Once | INTRAVENOUS | Status: AC | PRN
  Administered 2015-06-17: 10 mL via INTRAVENOUS

## 2015-06-24 LAB — BASIC METABOLIC PANEL
BUN: 16 mg/dL (ref 7–25)
CO2: 28 mmol/L (ref 20–31)
Calcium: 9 mg/dL (ref 8.6–10.3)
Chloride: 105 mmol/L (ref 98–110)
Creat: 1 mg/dL (ref 0.70–1.25)
GLUCOSE: 100 mg/dL — AB (ref 65–99)
Potassium: 4.1 mmol/L (ref 3.5–5.3)
Sodium: 142 mmol/L (ref 135–146)

## 2015-06-30 ENCOUNTER — Ambulatory Visit (HOSPITAL_COMMUNITY): Admission: RE | Admit: 2015-06-30 | Payer: Medicare Other | Source: Ambulatory Visit

## 2015-08-05 ENCOUNTER — Encounter (INDEPENDENT_AMBULATORY_CARE_PROVIDER_SITE_OTHER): Payer: Self-pay

## 2015-08-22 ENCOUNTER — Encounter: Payer: Self-pay | Admitting: Family Medicine

## 2015-08-22 DIAGNOSIS — R739 Hyperglycemia, unspecified: Secondary | ICD-10-CM | POA: Insufficient documentation

## 2015-08-22 DIAGNOSIS — E119 Type 2 diabetes mellitus without complications: Secondary | ICD-10-CM | POA: Insufficient documentation

## 2015-08-22 DIAGNOSIS — K7689 Other specified diseases of liver: Secondary | ICD-10-CM | POA: Insufficient documentation

## 2015-08-22 DIAGNOSIS — N529 Male erectile dysfunction, unspecified: Secondary | ICD-10-CM | POA: Insufficient documentation

## 2015-08-22 DIAGNOSIS — L209 Atopic dermatitis, unspecified: Secondary | ICD-10-CM | POA: Insufficient documentation

## 2015-08-22 DIAGNOSIS — K7581 Nonalcoholic steatohepatitis (NASH): Secondary | ICD-10-CM | POA: Insufficient documentation

## 2015-08-22 DIAGNOSIS — M25061 Hemarthrosis, right knee: Secondary | ICD-10-CM | POA: Insufficient documentation

## 2015-08-22 DIAGNOSIS — J45909 Unspecified asthma, uncomplicated: Secondary | ICD-10-CM | POA: Insufficient documentation

## 2015-08-22 DIAGNOSIS — M67432 Ganglion, left wrist: Secondary | ICD-10-CM | POA: Insufficient documentation

## 2015-08-22 DIAGNOSIS — M1711 Unilateral primary osteoarthritis, right knee: Secondary | ICD-10-CM | POA: Insufficient documentation

## 2015-08-22 DIAGNOSIS — K219 Gastro-esophageal reflux disease without esophagitis: Secondary | ICD-10-CM | POA: Insufficient documentation

## 2015-08-22 DIAGNOSIS — L80 Vitiligo: Secondary | ICD-10-CM | POA: Insufficient documentation

## 2015-08-22 DIAGNOSIS — K7460 Unspecified cirrhosis of liver: Secondary | ICD-10-CM | POA: Insufficient documentation

## 2015-09-15 ENCOUNTER — Encounter (INDEPENDENT_AMBULATORY_CARE_PROVIDER_SITE_OTHER): Payer: Self-pay | Admitting: *Deleted

## 2015-09-22 ENCOUNTER — Ambulatory Visit (INDEPENDENT_AMBULATORY_CARE_PROVIDER_SITE_OTHER): Payer: Medicare Other | Admitting: Family Medicine

## 2015-09-22 ENCOUNTER — Encounter: Payer: Self-pay | Admitting: Family Medicine

## 2015-09-22 VITALS — BP 122/78 | HR 75 | Temp 98.5°F | Resp 18 | Wt 213.0 lb

## 2015-09-22 DIAGNOSIS — R059 Cough, unspecified: Secondary | ICD-10-CM

## 2015-09-22 DIAGNOSIS — J40 Bronchitis, not specified as acute or chronic: Secondary | ICD-10-CM | POA: Diagnosis not present

## 2015-09-22 DIAGNOSIS — R05 Cough: Secondary | ICD-10-CM

## 2015-09-22 MED ORDER — HYDROCODONE-HOMATROPINE 5-1.5 MG/5ML PO SYRP
5.0000 mL | ORAL_SOLUTION | Freq: Three times a day (TID) | ORAL | Status: DC | PRN
Start: 2015-09-22 — End: 2015-11-04

## 2015-09-22 MED ORDER — AZITHROMYCIN 250 MG PO TABS: ORAL_TABLET | ORAL | Status: AC

## 2015-09-22 NOTE — Progress Notes (Signed)
Patient: Stephen Burch Male    DOB: January 24, 1956   60 y.o.   MRN: [id number] Visit Date: 09/22/2015  Today's Provider: Mila Merry, MD   Chief Complaint  Patient presents with  . Cough   Subjective:    Cough This is a new problem. Episode onset: 2 weeks ago. The problem has been unchanged. The cough is productive of sputum (yellow- green colored). Associated symptoms include ear congestion, nasal congestion (now resolved), postnasal drip and wheezing. Pertinent negatives include no chest pain, chills, ear pain, eye redness, fever, headaches, heartburn, hemoptysis, myalgias, rash, rhinorrhea, sore throat, shortness of breath or sweats. The symptoms are aggravated by lying down. He has tried OTC cough suppressant (Mucinex  and Tussinex cough syrup) for the symptoms. The treatment provided mild relief.   Had some head congestion and chills last week which have resolved.     Allergies  Allergen Reactions  . Aspirin Shortness Of Breath  . Penicillins Shortness Of Breath and Rash   Previous Medications   MILK THISTLE 500 MG CAPS    Take 1 capsule by mouth 3 (three) times daily.   PANTOPRAZOLE (PROTONIX) 40 MG TABLET    Take 1 tablet (40 mg total) by mouth daily.    Review of Systems  Constitutional: Negative for fever, chills, diaphoresis, appetite change and fatigue.  HENT: Positive for postnasal drip. Negative for congestion, ear pain, mouth sores, nosebleeds, rhinorrhea, sinus pressure, sneezing and sore throat.   Eyes: Negative for pain, discharge, redness and itching.  Respiratory: Positive for cough and wheezing. Negative for hemoptysis, chest tightness and shortness of breath.   Cardiovascular: Negative for chest pain and palpitations.  Gastrointestinal: Negative for heartburn, nausea, vomiting and abdominal pain.  Musculoskeletal: Negative for myalgias.  Skin: Negative for rash.  Neurological: Negative for headaches.    Social History  Substance Use Topics  .  Smoking status: Former Smoker -- 0.25 packs/day for 3 years    Types: Cigarettes  . Smokeless tobacco: Never Used     Comment: none since age 26-1 pack per week when he was smoking   . Alcohol Use: No     Comment: occasional use.  Last beer 6 months. Was not a heavy drinker.   Objective:   BP 122/78 mmHg  Pulse 75  Temp(Src) 98.5 F (36.9 C) (Oral)  Resp 18  Wt 213 lb (96.616 kg)  SpO2 96%  Physical Exam  General Appearance:    Alert, cooperative, no distress  HENT:   bilateral TM normal without fluid or infection, neck without nodes, sinuses nontender and nasal mucosa congested  Eyes:    PERRL, conjunctiva/corneas clear, EOM's intact       Lungs:     Occasional expiratory wheeze, no rales, ,respirations unlabored  Heart:    Regular rate and rhythm  Neurologic:   Awake, alert, oriented x 3. No apparent focal neurological           defect.           Assessment & Plan:     1. Bronchitis  - azithromycin (ZITHROMAX) 250 MG tablet; 2 by mouth today, then 1 daily for 4 days  Dispense: 6 tablet; Refill: 0  2. Cough  - HYDROcodone-homatropine (HYCODAN) 5-1.5 MG/5ML syrup; Take 5 mLs by mouth every 8 (eight) hours as needed for cough.  Dispense: 120 mL; Refill: 0  Call if symptoms change or if not rapidly improving.   Work excuse the remainder of the week  Mila Merry, MD  [facility]

## 2015-10-26 ENCOUNTER — Other Ambulatory Visit (INDEPENDENT_AMBULATORY_CARE_PROVIDER_SITE_OTHER): Payer: Self-pay | Admitting: *Deleted

## 2015-10-26 ENCOUNTER — Encounter (INDEPENDENT_AMBULATORY_CARE_PROVIDER_SITE_OTHER): Payer: Self-pay | Admitting: *Deleted

## 2015-10-26 DIAGNOSIS — K7469 Other cirrhosis of liver: Secondary | ICD-10-CM

## 2015-11-04 ENCOUNTER — Ambulatory Visit (INDEPENDENT_AMBULATORY_CARE_PROVIDER_SITE_OTHER): Payer: Medicare Other | Admitting: Family Medicine

## 2015-11-04 ENCOUNTER — Encounter: Payer: Self-pay | Admitting: Family Medicine

## 2015-11-04 VITALS — BP 110/68 | HR 65 | Temp 98.6°F | Resp 16 | Wt 215.0 lb

## 2015-11-04 DIAGNOSIS — L259 Unspecified contact dermatitis, unspecified cause: Secondary | ICD-10-CM

## 2015-11-04 MED ORDER — METHYLPREDNISOLONE ACETATE 80 MG/ML IJ SUSP
80.0000 mg | Freq: Once | INTRAMUSCULAR | Status: AC
Start: 2015-11-04 — End: 2015-11-04
  Administered 2015-11-04: 80 mg via INTRAMUSCULAR

## 2015-11-04 MED ORDER — PREDNISONE 20 MG PO TABS: ORAL_TABLET | ORAL | Status: DC

## 2015-11-04 NOTE — Progress Notes (Signed)
Patient: Stephen Burch Male    DOB: March 16, 1956   60 y.o.   MRN: [id number] Visit Date: 11/04/2015  Today's Provider: Mila Merry, MD   Chief Complaint  Patient presents with  . Rash   Subjective:    Rash This is a new problem. The current episode started yesterday. The problem has been gradually worsening since onset. The affected locations include the right arm and left arm. The rash is characterized by itchiness (bumps). He was exposed to plant contact (poison oak). Pertinent negatives include no anorexia, congestion, cough, diarrhea, eye pain, facial edema, fatigue, fever, joint pain, nail changes, rhinorrhea, shortness of breath, sore throat or vomiting. Past treatments include nothing.   He reports history of having severe allergic reactions to poison oak in past, nearly being hospitalized one time. He was seen for this last year and had injection of depot medrol which he states worked well, and he desires today.     Allergies  Allergen Reactions  . Aspirin Shortness Of Breath  . Penicillins Shortness Of Breath and Rash   Previous Medications   PANTOPRAZOLE (PROTONIX) 40 MG TABLET    Take 1 tablet (40 mg total) by mouth daily.    Review of Systems  Constitutional: Negative for fever, chills, appetite change and fatigue.  HENT: Negative for congestion, rhinorrhea and sore throat.   Eyes: Negative for pain.  Respiratory: Negative for cough, chest tightness, shortness of breath and wheezing.   Cardiovascular: Negative for chest pain and palpitations.  Gastrointestinal: Negative for nausea, vomiting, abdominal pain, diarrhea and anorexia.  Musculoskeletal: Negative for joint pain.  Skin: Positive for rash. Negative for nail changes.    Social History  Substance Use Topics  . Smoking status: Former Smoker -- 0.25 packs/day for 3 years    Types: Cigarettes  . Smokeless tobacco: Never Used     Comment: none since age 79-1 pack per week when he was smoking   .  Alcohol Use: 0.0 oz/week    0 Standard drinks or equivalent per week     Comment: occasional use.  Last beer 6 months. Was not a heavy drinker.   Objective:   BP 110/68 mmHg  Pulse 65  Temp(Src) 98.6 F (37 C) (Oral)  Resp 16  Wt 215 lb (97.523 kg)  SpO2 96%  Physical Exam  General appearance: alert, well developed, well nourished, cooperative and in no distress Extremities: No gross deformities Skin: Scattered vesicular eruption with mild erythema on volar aspect of both forearms      Assessment & Plan:     1. Contact dermatitis Admiistered 80mg  Depot-Medrol in office today.   - predniSONE (DELTASONE) 20 MG tablet; Taper as follows: 3 pills for 4 days, two pills for 4 days, one pill for four days  Dispense: 24 tablet; Refill: 0  Call if symptoms change or if not rapidly improving.          Mila Merry, MD  [facility]

## 2015-11-18 ENCOUNTER — Telehealth (INDEPENDENT_AMBULATORY_CARE_PROVIDER_SITE_OTHER): Payer: Self-pay | Admitting: *Deleted

## 2015-11-18 NOTE — Telephone Encounter (Signed)
Referring MD/PCP: fisher   Procedure: egd  Reason/Indication:  ocrrishosi  Has patient had this procedure before?  Yes, 2014 -- epic  If so, when, by whom and where?    Is there a family history of colon cancer?    Who?  What age when diagnosed?    Is patient diabetic?   no      Does patient have prosthetic heart valve or mechanical valve?  no  Do you have a pacemaker?  no  Has patient ever had endocarditis? no  Has patient had joint replacement within last 12 months?  no  Does patient tend to be constipated or take laxatives? no  Does patient have a history of alcohol/drug use?  o  Is patient on Coumadin, Plavix and/or Aspirin? no  Medications: protonix 40 mg daily  Allergies: see epic  Medication Adjustment:   Procedure date & time: 12/15/15 at 1245

## 2015-11-22 NOTE — Telephone Encounter (Signed)
agree

## 2015-12-13 ENCOUNTER — Ambulatory Visit (INDEPENDENT_AMBULATORY_CARE_PROVIDER_SITE_OTHER): Payer: Medicare Other | Admitting: Internal Medicine

## 2015-12-13 ENCOUNTER — Encounter (INDEPENDENT_AMBULATORY_CARE_PROVIDER_SITE_OTHER): Payer: Self-pay | Admitting: Internal Medicine

## 2015-12-13 VITALS — BP 110/80 | HR 67 | Temp 97.6°F | Resp 18 | Ht 72.0 in | Wt 208.4 lb

## 2015-12-13 DIAGNOSIS — K7460 Unspecified cirrhosis of liver: Secondary | ICD-10-CM | POA: Diagnosis not present

## 2015-12-13 DIAGNOSIS — K2270 Barrett's esophagus without dysplasia: Secondary | ICD-10-CM | POA: Diagnosis not present

## 2015-12-13 DIAGNOSIS — R400 Somnolence: Secondary | ICD-10-CM | POA: Diagnosis not present

## 2015-12-13 DIAGNOSIS — K219 Gastro-esophageal reflux disease without esophagitis: Secondary | ICD-10-CM

## 2015-12-13 LAB — CBC
HCT: 49.4 % (ref 38.5–50.0)
Hemoglobin: 16.9 g/dL (ref 13.2–17.1)
MCH: 30 pg (ref 27.0–33.0)
MCHC: 34.2 g/dL (ref 32.0–36.0)
MCV: 87.7 fL (ref 80.0–100.0)
MPV: 10.3 fL (ref 7.5–12.5)
PLATELETS: 109 10*3/uL — AB (ref 140–400)
RBC: 5.63 MIL/uL (ref 4.20–5.80)
RDW: 14.5 % (ref 11.0–15.0)
WBC: 4 10*3/uL (ref 3.8–10.8)

## 2015-12-13 MED ORDER — PANTOPRAZOLE SODIUM 40 MG PO TBEC: 40.0000 mg | DELAYED_RELEASE_TABLET | Freq: Every day | ORAL | Status: DC

## 2015-12-13 NOTE — Progress Notes (Signed)
Presenting complaint;  Follow-up for chronic liver disease and GERD.  Subjective:  Stephen Burch is 60 year old Caucasian male was here for scheduled visit. He was last seen 6 months ago. He had umbilical hernia repair by Dr. Dalbert Batman in March this year. He did not have problems during or after surgery. He has good appetite. Bowels move daily. He denies melena or rectal bleeding. He has occasional heartburn. He denies dysphagia nausea vomiting or abdominal pain. His new complaint is one of frequent yawns and he feels sleepy all the time. He denies falling asleep behind revealed or doing tasks which require attention. He is walking or doing exercise twice a week. He states he has slacked off. He has not been confused and denies memory impairment.   Current Medications: Outpatient Encounter Prescriptions as of 12/13/2015  Medication Sig  . dicyclomine (BENTYL) 20 MG tablet Take 20 mg by mouth as needed.   . pantoprazole (PROTONIX) 40 MG tablet Take 1 tablet (40 mg total) by mouth daily.   No facility-administered encounter medications on file as of 12/13/2015.     Objective: Blood pressure 110/80, pulse 67, temperature 97.6 F (36.4 C), temperature source Oral, resp. rate 18, height 6' (1.829 m), weight 208 lb 6.4 oz (94.53 kg). Patient is alert and in no acute distress. He does not have asterixis. Conjunctiva is pink. Sclera is nonicteric Oropharyngeal mucosa is normal. No neck masses or thyromegaly noted. Cardiac exam with regular rhythm normal S1 and S2. No murmur or gallop noted. Lungs are clear to auscultation. Abdomen is symmetrical. He has well-healed scar below the umbilicus. Abdomen is soft and nontender with palpable spleen tip. Liver edge is indistinct. Shifting dullness is absent. No LE edema or clubbing noted. He has vitiligo involving his forehead and both hands.  Labs/studies Results:  AFP on 11/30/2014 was 2.7.    MRN 06/17/2015 revealed cirrhotic liver with 2 small  hypervascular lesions in segment 7 unchanged from prior MR of June 2016. Splenomegaly but no ascites or varices.  Assessment:  #1. Nonalcoholic cirrhosis. He remains with well preserved hepatic function. He has stigmata of portal hypertension. Last EGD was in March 2014 revealed single small varix. #2. 2 small hepatic lesions possibly hemangiomas. Stable between first MR of 12/20/2014 and second MR of December 2016. He will have MR in December 2017. #3. GERD complicated by short segment Barrett's esophagus. Heartburns well controlled with therapy. #4. Somnolence. He does not have asterixis. Will check serum ammonia.   Plan:  Patient will go to the lab for CBC LFTs AFP and serum ammonia. EGD later this week to assess for varices and follow-up for Barrett's esophagus. MR liver in December 2017.

## 2015-12-13 NOTE — Patient Instructions (Signed)
Physician will call with results of blood work. Next MRI of liver and December 2017.

## 2015-12-14 ENCOUNTER — Other Ambulatory Visit (INDEPENDENT_AMBULATORY_CARE_PROVIDER_SITE_OTHER): Payer: Self-pay | Admitting: *Deleted

## 2015-12-14 LAB — HEPATIC FUNCTION PANEL
ALT: 29 U/L (ref 9–46)
AST: 26 U/L (ref 10–35)
Albumin: 4.5 g/dL (ref 3.6–5.1)
Alkaline Phosphatase: 110 U/L (ref 40–115)
Bilirubin, Direct: 0.3 mg/dL — ABNORMAL HIGH (ref <=0.2)
Indirect Bilirubin: 1.2 mg/dL (ref 0.2–1.2)
TOTAL PROTEIN: 7 g/dL (ref 6.1–8.1)
Total Bilirubin: 1.5 mg/dL — ABNORMAL HIGH (ref 0.2–1.2)

## 2015-12-14 LAB — AMMONIA: AMMONIA: 70 umol/L — AB (ref 16–53)

## 2015-12-14 LAB — AFP TUMOR MARKER: AFP-Tumor Marker: 3.4 ng/mL (ref <6.1)

## 2015-12-14 NOTE — Telephone Encounter (Signed)
Rx was called to the [facility] in [place]. Lactulose 30 BID as per Dr.Rehman. 240 mL with 1 refill. The ammonia result was left on the patient's cell phone voicemail. He was advised that Dr.Rehman was going to talk with him at the time of his procedure.

## 2015-12-15 ENCOUNTER — Encounter (HOSPITAL_COMMUNITY): Payer: Self-pay | Admitting: *Deleted

## 2015-12-15 ENCOUNTER — Encounter (HOSPITAL_COMMUNITY)
Admission: RE | Disposition: A | Discharge status: Home / Self Care | Payer: Self-pay | Source: Ambulatory Visit | Attending: Internal Medicine

## 2015-12-15 ENCOUNTER — Ambulatory Visit (HOSPITAL_COMMUNITY)
Admission: RE | Admit: 2015-12-15 | Discharge: 2015-12-15 | Disposition: A | Discharge status: Home / Self Care | Payer: Medicare Other | Source: Ambulatory Visit | Attending: Internal Medicine | Admitting: Internal Medicine

## 2015-12-15 DIAGNOSIS — K2270 Barrett's esophagus without dysplasia: Secondary | ICD-10-CM | POA: Insufficient documentation

## 2015-12-15 DIAGNOSIS — K3189 Other diseases of stomach and duodenum: Secondary | ICD-10-CM | POA: Insufficient documentation

## 2015-12-15 DIAGNOSIS — Z833 Family history of diabetes mellitus: Secondary | ICD-10-CM | POA: Diagnosis not present

## 2015-12-15 DIAGNOSIS — K766 Portal hypertension: Secondary | ICD-10-CM | POA: Insufficient documentation

## 2015-12-15 DIAGNOSIS — M170 Bilateral primary osteoarthritis of knee: Secondary | ICD-10-CM | POA: Insufficient documentation

## 2015-12-15 DIAGNOSIS — Z87891 Personal history of nicotine dependence: Secondary | ICD-10-CM | POA: Insufficient documentation

## 2015-12-15 DIAGNOSIS — Z96653 Presence of artificial knee joint, bilateral: Secondary | ICD-10-CM | POA: Insufficient documentation

## 2015-12-15 DIAGNOSIS — Z13810 Encounter for screening for upper gastrointestinal disorder: Secondary | ICD-10-CM | POA: Insufficient documentation

## 2015-12-15 DIAGNOSIS — I8500 Esophageal varices without bleeding: Secondary | ICD-10-CM | POA: Diagnosis not present

## 2015-12-15 DIAGNOSIS — K228 Other specified diseases of esophagus: Secondary | ICD-10-CM | POA: Diagnosis not present

## 2015-12-15 DIAGNOSIS — K219 Gastro-esophageal reflux disease without esophagitis: Secondary | ICD-10-CM | POA: Diagnosis not present

## 2015-12-15 DIAGNOSIS — Z79899 Other long term (current) drug therapy: Secondary | ICD-10-CM | POA: Insufficient documentation

## 2015-12-15 DIAGNOSIS — I8510 Secondary esophageal varices without bleeding: Secondary | ICD-10-CM | POA: Diagnosis not present

## 2015-12-15 DIAGNOSIS — K7460 Unspecified cirrhosis of liver: Secondary | ICD-10-CM | POA: Insufficient documentation

## 2015-12-15 DIAGNOSIS — K7469 Other cirrhosis of liver: Secondary | ICD-10-CM

## 2015-12-15 HISTORY — PX: ESOPHAGOGASTRODUODENOSCOPY: SHX5428

## 2015-12-15 SURGERY — EGD (ESOPHAGOGASTRODUODENOSCOPY)
Anesthesia: Moderate Sedation

## 2015-12-15 MED ORDER — BUTAMBEN-TETRACAINE-BENZOCAINE 2-2-14 % EX AERO
INHALATION_SPRAY | CUTANEOUS | Status: AC
  Filled 2015-12-15: qty 20

## 2015-12-15 MED ORDER — MEPERIDINE HCL 50 MG/ML IJ SOLN
INTRAMUSCULAR | Status: DC | PRN
  Administered 2015-12-15 (×2): 25 mg via INTRAVENOUS

## 2015-12-15 MED ORDER — MEPERIDINE HCL 50 MG/ML IJ SOLN
INTRAMUSCULAR | Status: AC
  Filled 2015-12-15: qty 1

## 2015-12-15 MED ORDER — MIDAZOLAM HCL 5 MG/5ML IJ SOLN
INTRAMUSCULAR | Status: DC | PRN
  Administered 2015-12-15: 1 mg via INTRAVENOUS
  Administered 2015-12-15 (×2): 2 mg via INTRAVENOUS

## 2015-12-15 MED ORDER — BUTAMBEN-TETRACAINE-BENZOCAINE 2-2-14 % EX AERO
INHALATION_SPRAY | CUTANEOUS | Status: DC | PRN
  Administered 2015-12-15: 2 via TOPICAL

## 2015-12-15 MED ORDER — MIDAZOLAM HCL 5 MG/5ML IJ SOLN
INTRAMUSCULAR | Status: AC
  Filled 2015-12-15: qty 10

## 2015-12-15 MED ORDER — SODIUM CHLORIDE 0.9 % IV SOLN
INTRAVENOUS | Status: DC
  Administered 2015-12-15: 1000 mL via INTRAVENOUS

## 2015-12-15 MED ORDER — STERILE WATER FOR IRRIGATION IR SOLN
Status: DC | PRN
  Administered 2015-12-15: 2.5 mL

## 2015-12-15 MED ORDER — LACTULOSE 10 GM/15ML PO SOLN: 20.0000 g | Freq: Two times a day (BID) | ORAL | Status: DC

## 2015-12-15 NOTE — H&P (Signed)
Stephen Burch is an 60 y.o. male.   Chief Complaint: Patient is here for EGD. HPI: Patient is 60 year old Caucasian male who is cirrhosis most likely secondary to steatohepatitis who is here for screening/surveillance exam. Has chronic GERD, history by short segment Barrett's esophagus. Last EGD was in March 2014 also revealed single column of grade 1 esophageal varix. He was seen in the office earlier this week. He was complaining of somnolence and frequent yawning. Serum ammonia was checked and was elevated. He was given prescription for lactulose which he has not started yet. He has never had asterixis.  Past Medical History  Diagnosis Date  . Cirrhosis ([place])   . Asthma     as a kid  . Pneumonia     3 years ago  . GERD (gastroesophageal reflux disease)   . Arthritis     knees    Past Surgical History  Procedure Laterality Date  . Replacement total knee      Bilateral knee replacement, 2010, 2012  . Esophagogastroduodenoscopy N/A 09/17/2012    Procedure: ESOPHAGOGASTRODUODENOSCOPY (EGD);  Surgeon: Rogene Houston, MD;  Location: [facility];  Service: Endoscopy;  Laterality: N/A;  200  . Tonsillectomy    . Inguinal hernia repair Bilateral 05/22/2013    Procedure: REPAIR OF RECURRENT INCARCERATED INGUINAL HERNIA WITH MESH RIGHT SIDE,  REPAIR OF RECURRENT INGUINAL HERNIA WITH MESH LEFT SIDE;  Surgeon: [name], MD;  Location: [facility];  Service: General;  Laterality: Bilateral;  . Insertion of mesh Bilateral 05/22/2013    Procedure: INSERTION OF MESH;  Surgeon: [name], MD;  Location: [place];  Service: General;  Laterality: Bilateral;  . Ear cyst excision Right 09/06/2014    Procedure: OPEN EXCISION BAKER'S CYST RIGHT KNEE;  Surgeon: Vickey Huger, MD;  Location: [facility];  Service: Orthopedics;  Laterality: Right;  . Knee arthroscopy with excision baker's cyst Right 10/01/14  . Irrigation and debridement right knee  03/23/2009    Dr. Sabra Heck, [facility]  . Joint replacement   03/16/2009    RIGHT KNEE REPLACEMENT  . Hernia repair Bilateral 05/22/2013, 11/1997    x 2 (Inguinal hernia) Dr. Dalbert Batman, Dr. Jacqlyn Larsen  . Esophageal dilation  1996  . Ct scan of abdomen  06/27/2012    Cirrhosis with portal hypertension. Marked slenomegaly. Bilateral inguinal hernias. Right inguinal hernia contains part of the bladder. Report from [facility]  . Radionuclide imaging study      Normal LV systolic function with ejection of 50%; Normal myocardial perfusion without evidence of myocardial ischemia  . Exercise tredmill test  03/27/2005    Normal    Family History  Problem Relation Age of Onset  . Colon cancer Neg Hx   . Colon polyps Neg Hx   . Diabetes Mother     type 2  . Arthritis Father   . GI Bleed Father     upper GI Bleed  . Arthritis Brother   . Diabetes Brother     type 2  . Diabetes Paternal Uncle     type 2  . Heart attack Maternal Grandmother   . Heart attack Maternal Grandfather    Social History:  reports that he has quit smoking. His smoking use included Cigarettes. He has a .75 pack-year smoking history. He has never used smokeless tobacco. He reports that he drinks alcohol. He reports that he does not use illicit drugs.  Allergies:  Allergies  Allergen Reactions  . Aspirin Shortness Of Breath  .  Penicillins Shortness Of Breath and Rash    Has patient had a PCN reaction causing immediate rash, facial/tongue/throat swelling, SOB or lightheadedness with hypotension: Yes Has patient had a PCN reaction causing severe rash involving mucus membranes or skin necrosis: No Has patient had a PCN reaction that required hospitalization No Has patient had a PCN reaction occurring within the last 10 years: No If all of the above answers are "NO", then may proceed with Cephalosporin use.     Medications Prior to Admission  Medication Sig Dispense Refill  . dicyclomine (BENTYL) 20 MG tablet Take 20 mg by mouth as needed.   2  . pantoprazole (PROTONIX) 40 MG  tablet Take 1 tablet (40 mg total) by mouth daily. 30 tablet 11    No results found for this or any previous visit (from the past 48 hour(s)). No results found.  ROS  Blood pressure 121/87, pulse 62, temperature 99 F (37.2 C), temperature source Oral, resp. rate 12, height 6' (1.829 m), weight 208 lb (94.348 kg), SpO2 94 %. Physical Exam  Constitutional: He appears well-developed and well-nourished.  HENT:  Mouth/Throat: Oropharynx is clear and moist.  Eyes: Conjunctivae are normal. No scleral icterus.  Neck: No thyromegaly present.  GI:  Abdomen is symmetrical with infra-umbilicus scar. Abdomen is soft and nontender with palpable spleen tip. No masses.   Musculoskeletal: He exhibits no edema.  Lymphadenopathy:    He has no cervical adenopathy.  Neurological: He is alert.  Skin: Skin is warm and dry.     Assessment/Plan Short segment Barrett's esophagus and cirrhosis. EGD to assess and treat esophageal varices and also evaluate Barrett's.  Hildred Laser, MD 12/15/2015, 12:34 PM

## 2015-12-15 NOTE — Discharge Instructions (Signed)
Resume usual medications and diet. Lactulose 30 mL or 1 ounce by mouth twice daily. Can titrate dose. Goal is 3 bowel movements daily. No driving for 24 hours. Please call office with progress report in 3-4 weeks(regarding somnolence and yawning).         Esophagogastroduodenoscopy, Care After Refer to this sheet in the next few weeks. These instructions provide you with information about caring for yourself after your procedure. Your health care provider may also give you more specific instructions. Your treatment has been planned according to current medical practices, but problems sometimes occur. Call your health care provider if you have any problems or questions after your procedure. WHAT TO EXPECT AFTER THE PROCEDURE After your procedure, it is typical to feel:  Soreness in your throat.  Pain with swallowing.  Sick to your stomach (nauseous).  Bloated.  Dizzy.  Fatigued. HOME CARE INSTRUCTIONS  Do not eat or drink anything until the numbing medicine (local anesthetic) has worn off and your gag reflex has returned. You will know that the local anesthetic has worn off when you can swallow comfortably.  Do not drive or operate machinery until directed by your health care provider.  Take medicines only as directed by your health care provider. SEEK MEDICAL CARE IF:   You cannot stop coughing.  You are not urinating at all or less than usual. SEEK IMMEDIATE MEDICAL CARE IF:  You have difficulty swallowing.  You cannot eat or drink.  You have worsening throat or chest pain.  You have dizziness or lightheadedness or you faint.  You have nausea or vomiting.  You have chills.  You have a fever.  You have severe abdominal pain.  You have black, tarry, or bloody stools.   This information is not intended to replace advice given to you by your health care provider. Make sure you discuss any questions you have with your health care provider.   Document  Released: 06/04/2012 Document Revised: 07/09/2014 Document Reviewed: 06/04/2012 Elsevier Interactive Patient Education 2016 [employer]     Esophageal Varices The esophagus is the passage that connects the throat to the stomach. Esophageal varices are blood vessels in the esophagus that have become enlarged. They develop when extra blood is forced to flow through them because the blood's normal pathway is blocked. Without treatment these blood vessels eventually break and bleed (hemorrhage). A hemorrhage is life-threatening. CAUSES This condition may be caused by:  Scarring of the liver (cirrhosis) due to alcoholism. This is the most common cause.  Liver disease.  Severe heart failure.  A blood clot in the portal vein.  Sarcoidosis. This is an inflammatory disease that can affect the liver.  Schistosomiasis. This is a parasitic infection that can cause liver damage. SYMPTOMS Usually there are no symptoms unless the esophageal varices bleed. Symptoms of bleeding esophageal varices include:  Vomiting material that is bright red or that is black and looks like coffee grounds.  Coughing up blood.  Black, tarry stools.  Dizziness or lightheadedness.  Low blood pressure.  Loss of consciousness. DIAGNOSIS This condition is diagnosed with tests, such as:  Endoscopy. During this test a thin, lighted tube is inserted through the mouth and into the esophagus.  Blood tests. These may be done to check liver function, blood counts, and the body's ability to form blood clots. TREATMENT This condition may be treated with:  Medicines that reduce pressure in the esophageal varices and reduce the risk of bleeding.  Procedures to reduce pressure in the  esophageal varices and reduce the risk of bleeding or stop bleeding. These include:  Variceal ligation. In this procedure, a rubber band is placed around the esophageal varices to keep them from bleeding.  Injection therapy. This  treatment involves an injection that causes the esophageal varices to shrink and close (sclerotherapy). Medicines that tighten blood vessels or alter blood flow may also be used.  Balloon tamponade. In this procedure, a tube is put into the esophagus and a balloon is passed through it and inflated.  Transjugular intrahepatic portosystemic shunt (TIPS) placement. In this procedure, a small tube is placed within the liver veins. This decreases blood flow and pressure to the esophageal varices.  A liver transplant. This may be done if other treatments do not work. HOME CARE INSTRUCTIONS  Take medicines only as directed by your health care provider.  Follow your health care provider's instructions about rest and physical activity. SEEK IMMEDIATE MEDICAL CARE IF:  You have any symptoms of this condition after treatment.  You are unable to eat or drink.  You have chest pain.   This information is not intended to replace advice given to you by your health care provider. Make sure you discuss any questions you have with your health care provider.   Document Released: 09/08/2003 Document Revised: 11/02/2014 Document Reviewed: 06/14/2014 Elsevier Interactive Patient Education [employer].

## 2015-12-15 NOTE — Op Note (Signed)
[facility] Patient Name: Stephen Burch Procedure Date: 12/15/2015 12:34 PM MRN: [id number] Date of Birth: 08-Apr-1956 Attending MD: Lionel December , MD CSN: [id number] Age: 60 Admit Type: Outpatient Procedure:                Upper GI endoscopy Indications:              Barrett's esophagus, Cirrhosis rule out esophageal                            varices Providers:                Lionel December, MD, Nena Polio, RN, Calton Dach,                            Technician Referring MD:             Demetrios Isaacs. Fisher, MD Medicines:                Cetacaine spray, Meperidine 50 mg IV, Midazolam 5                            mg IV Complications:            No immediate complications. Estimated Blood Loss:     Estimated blood loss: none. Procedure:                Pre-Anesthesia Assessment:                           - Prior to the procedure, a History and Physical                            was performed, and patient medications and                            allergies were reviewed. The patient's tolerance of                            previous anesthesia was also reviewed. The risks                            and benefits of the procedure and the sedation                            options and risks were discussed with the patient.                            All questions were answered, and informed consent                            was obtained. Prior Anticoagulants: The patient has                            taken no previous anticoagulant or antiplatelet                            agents.  ASA Grade Assessment: III - A patient with                            severe systemic disease. After reviewing the risks                            and benefits, the patient was deemed in                            satisfactory condition to undergo the procedure.                           After obtaining informed consent, the endoscope was                            passed under direct vision. Throughout  the                            procedure, the patient's blood pressure, pulse, and                            oxygen saturations were monitored continuously. The                            [id number]) was introduced through the mouth,                            and advanced to the second part of duodenum. The                            upper GI endoscopy was accomplished without                            difficulty. The patient tolerated the procedure                            well. Scope In: 12:49:59 PM Scope Out: 12:55:41 PM Total Procedure Duration: 0 hours 5 minutes 42 seconds  Findings:      The upper third of the esophagus and middle third of the esophagus were       normal.      There were esophageal mucosal changes secondary to established       short-segment Barrett's disease present in the lower third of the       esophagus. The maximum longitudinal extent of these mucosal changes was       1 cm in length.      Grade I varices were found in the lower third of the esophagus. Three       short columns.      The Z-line was irregular and was found 40 cm from the incisors.      Mild portal hypertensive gastropathy was found in the gastric body.      The exam of the stomach was otherwise normal.      The duodenal bulb and second portion of the duodenum were normal. Impression:               -  Normal upper third of esophagus and middle third                            of esophagus.                           - Esophageal mucosal changes secondary to                            established short-segment Barrett's disease.                            Barrett's was not biopsied.                           - Three columns of Grade I esophageal varices.                           - Z-line irregular, 40 cm from the incisors.                           - Portal hypertensive gastropathy.                           - Normal duodenal bulb and second portion of the                             duodenum.                           - No specimens collected. Moderate Sedation:      Moderate (conscious) sedation was administered by the endoscopy nurse       and supervised by the endoscopist. The following parameters were       monitored: oxygen saturation, heart rate, blood pressure, CO2       capnography and response to care. Total physician intraservice time was       14 minutes. Recommendation:           - Patient has a contact number available for                            emergencies. The signs and symptoms of potential                            delayed complications were discussed with the                            patient. Return to normal activities tomorrow.                            Written discharge instructions were provided to the                            patient.                           -  Resume previous diet today.                           - Continue present medications.                           - Repeat upper endoscopy in 1 year.                           - Return to GI office in 6 months. Procedure Code(s):        --- Professional ---                           [phone], Esophagogastroduodenoscopy, flexible,                            transoral; diagnostic, including collection of                            specimen(s) by brushing or washing, when performed                            (separate procedure)                           99152, Moderate sedation services provided by the                            same physician or other qualified health care                            professional performing the diagnostic or                            therapeutic service that the sedation supports,                            requiring the presence of an independent trained                            observer to assist in the monitoring of the                            patient's level of consciousness and physiological                            status; initial 15  minutes of intraservice time,                            patient age 33 years or older Diagnosis Code(s):        --- Professional ---                           K22.70, Barrett's esophagus without dysplasia  K74.60, Unspecified cirrhosis of liver                           I85.10, Secondary esophageal varices without                            bleeding                           K22.8, Other specified diseases of esophagus                           K76.6, Portal hypertension                           K31.89, Other diseases of stomach and duodenum CPT copyright 2016 American [employer]. All rights reserved. The codes documented in this report are preliminary and upon coder review may  be revised to meet current compliance requirements. Lionel December, MD Lionel December, MD 12/15/2015 1:18:40 PM This report has been signed electronically. Number of Addenda: 0

## 2015-12-16 ENCOUNTER — Encounter (HOSPITAL_COMMUNITY): Payer: Self-pay | Admitting: Internal Medicine

## 2016-02-05 ENCOUNTER — Emergency Department (HOSPITAL_COMMUNITY): Payer: Medicare Other

## 2016-02-05 ENCOUNTER — Encounter (HOSPITAL_COMMUNITY): Payer: Self-pay

## 2016-02-05 ENCOUNTER — Emergency Department (HOSPITAL_COMMUNITY)
Admission: EM | Admit: 2016-02-05 | Discharge: 2016-02-05 | Disposition: A | Discharge status: Home / Self Care | Payer: Medicare Other | Attending: Emergency Medicine | Admitting: Emergency Medicine

## 2016-02-05 DIAGNOSIS — Z79899 Other long term (current) drug therapy: Secondary | ICD-10-CM | POA: Insufficient documentation

## 2016-02-05 DIAGNOSIS — Z87891 Personal history of nicotine dependence: Secondary | ICD-10-CM | POA: Diagnosis not present

## 2016-02-05 DIAGNOSIS — M25561 Pain in right knee: Secondary | ICD-10-CM | POA: Diagnosis present

## 2016-02-05 DIAGNOSIS — M7121 Synovial cyst of popliteal space [Baker], right knee: Secondary | ICD-10-CM | POA: Insufficient documentation

## 2016-02-05 DIAGNOSIS — M25461 Effusion, right knee: Secondary | ICD-10-CM | POA: Insufficient documentation

## 2016-02-05 DIAGNOSIS — J45909 Unspecified asthma, uncomplicated: Secondary | ICD-10-CM | POA: Insufficient documentation

## 2016-02-05 MED ORDER — HYDROCODONE-ACETAMINOPHEN 5-325 MG PO TABS: 1.0000 | ORAL_TABLET | ORAL | 0 refills | Status: DC | PRN

## 2016-02-05 MED ORDER — CIPROFLOXACIN HCL 250 MG PO TABS
500.0000 mg | ORAL_TABLET | Freq: Once | ORAL | Status: AC
  Administered 2016-02-05: 500 mg via ORAL
  Filled 2016-02-05: qty 2

## 2016-02-05 MED ORDER — CIPROFLOXACIN HCL 500 MG PO TABS: 500.0000 mg | ORAL_TABLET | Freq: Two times a day (BID) | ORAL | 0 refills | Status: DC

## 2016-02-05 MED ORDER — HYDROCODONE-ACETAMINOPHEN 5-325 MG PO TABS
1.0000 | ORAL_TABLET | Freq: Once | ORAL | Status: AC
  Administered 2016-02-05: 1 via ORAL
  Filled 2016-02-05: qty 1

## 2016-02-05 NOTE — ED Provider Notes (Signed)
[facility] DEPT Provider Note   CSN: [id number] Arrival date & time: 02/05/16  1655  First Provider Contact:  None       History   Chief Complaint Chief Complaint  Patient presents with  . Knee Pain    HPI Stephen Burch is a 60 y.o. male.  Stephen Burch is a 60 y.o. Male presenting for evaluation of right knee pain. He has a long standing history of a right knee Bakers cyst with intermittent episodes of drainage despite surgery performed last year by Dr. Ronnie Derby. Pt endorses persistent swelling which culminates in drainage from the tract that has formed approximately every 3 days for the past several months.  He has been told the next step will need to be repeat surgery with wound vac placement and he is planning to schedule an appointment with his surgeon to discuss, although has been trying to avoid further surgery.  He is concerned for possible infection in the joint given increasing pain.  He denies fevers, redness or drainage of purulent looking fluid as the last drainage occurred this am and was clear yellow colored and like "motor oil" consistency, no different from prior episodes of drainage.  He denies radiation of pain which is constant with weight bearing and movement, better at rest. He has found no alleviators.        Past Medical History:  Diagnosis Date  . Arthritis    knees  . Asthma    as a kid  . Cirrhosis ([place])   . GERD (gastroesophageal reflux disease)   . Pneumonia    3 years ago    Patient Active Problem List   Diagnosis Date Noted  . Vitiligo 08/22/2015  . AD (atopic dermatitis) 08/22/2015  . Osteoarthritis of right knee 08/22/2015  . Cirrhosis ([place]) 08/22/2015  . Hyperglycemia 08/22/2015  . GERD (gastroesophageal reflux disease) 08/22/2015  . Ganglion of left wrist 08/22/2015  . ED (erectile dysfunction) of organic origin 08/22/2015  . Asthma 08/22/2015  . Dysplastic nodule of liver 08/22/2015  . Baker cyst 09/06/2014  . H/O total knee  replacement 08/05/2014  . Bilateral inguinal hernia 08/01/2012  . Splenomegaly 08/01/2012  . Portal hypertension ([place]) 08/01/2012  . Gallstones 08/01/2012    Past Surgical History:  Procedure Laterality Date  . CT SCAN of Abdomen  06/27/2012   Cirrhosis with portal hypertension. Marked slenomegaly. Bilateral inguinal hernias. Right inguinal hernia contains part of the bladder. Report from [facility] Right 09/06/2014   Procedure: OPEN EXCISION BAKER'S CYST RIGHT KNEE;  Surgeon: Vickey Huger, MD;  Location: [facility];  Service: Orthopedics;  Laterality: Right;  . [place]  . ESOPHAGOGASTRODUODENOSCOPY N/A 09/17/2012   Procedure: ESOPHAGOGASTRODUODENOSCOPY (EGD);  Surgeon: Rogene Houston, MD;  [facility];  Service: Endoscopy;  Laterality: N/A;  200  . ESOPHAGOGASTRODUODENOSCOPY N/A 12/15/2015   Procedure: ESOPHAGOGASTRODUODENOSCOPY (EGD);  Surgeon: Rogene Houston, MD;  [facility];  Service: Endoscopy;  Laterality: N/A;  1245  . Exercise tredmill test  03/27/2005   Normal  . HERNIA REPAIR Bilateral 05/22/2013, 11/1997   x 2 (Inguinal hernia) Dr. Dalbert Batman, Dr. Jacqlyn Larsen  . INGUINAL HERNIA REPAIR Bilateral 05/22/2013   Procedure: REPAIR OF RECURRENT INCARCERATED INGUINAL HERNIA WITH MESH RIGHT SIDE,  REPAIR OF RECURRENT INGUINAL HERNIA WITH MESH LEFT SIDE;  Surgeon: [name], MD;  Location: [facility];  Service: General;  Laterality: Bilateral;  . INSERTION OF MESH Bilateral 05/22/2013  Procedure: INSERTION OF MESH;  Surgeon: [name], MD;  Location: [facility];  Service: General;  Laterality: Bilateral;  . Irrigation and Debridement right knee  03/23/2009   Dr. Sabra Heck, [facility]  . JOINT REPLACEMENT  03/16/2009   RIGHT KNEE REPLACEMENT  . KNEE ARTHROSCOPY WITH EXCISION BAKER'S CYST Right 10/01/14  . Radionuclide Imaging Study     Normal LV systolic function with ejection of 50%; Normal myocardial perfusion without evidence of  myocardial ischemia  . REPLACEMENT TOTAL KNEE     Bilateral knee replacement, 2010, 2012  . TONSILLECTOMY         Home Medications    Prior to Admission medications   Medication Sig Start Date End Date Taking? Authorizing Provider  dicyclomine (BENTYL) 20 MG tablet Take 20 mg by mouth daily as needed for spasms.  11/20/15  Yes Historical Provider, MD  lactulose (CHRONULAC) 10 GM/15ML solution Take 30 mLs (20 g total) by mouth 2 (two) times daily. 12/15/15  Yes Rogene Houston, MD  pantoprazole (PROTONIX) 40 MG tablet Take 1 tablet (40 mg total) by mouth daily. 12/13/15  Yes Rogene Houston, MD  ciprofloxacin (CIPRO) 500 MG tablet Take 1 tablet (500 mg total) by mouth 2 (two) times daily. 02/05/16   Evalee Jefferson, PA-C  HYDROcodone-acetaminophen (NORCO/VICODIN) 5-325 MG tablet Take 1 tablet by mouth every 4 (four) hours as needed. 02/05/16   Evalee Jefferson, PA-C    Family History Family History  Problem Relation Age of Onset  . Diabetes Mother     type 2  . Arthritis Father   . GI Bleed Father     upper GI Bleed  . Arthritis Brother   . Diabetes Brother     type 2  . Diabetes Paternal Uncle     type 2  . Heart attack Maternal Grandmother   . Heart attack Maternal Grandfather   . Colon cancer Neg Hx   . Colon polyps Neg Hx     Social History Social History  Substance Use Topics  . Smoking status: Former Smoker    Packs/day: 0.25    Years: 3.00    Types: Cigarettes  . Smokeless tobacco: Never Used     Comment: none since age 31-1 pack per week when he was smoking   . Alcohol use No     Allergies   Aspirin and Penicillins   Review of Systems Review of Systems  Constitutional: Negative for chills and fever.  Musculoskeletal: Positive for arthralgias and joint swelling. Negative for myalgias.  Skin: Negative for color change.  Neurological: Negative for weakness and numbness.     Physical Exam Updated Vital Signs BP 122/82 (BP Location: Left Arm)   Pulse 86   Temp  98.6 F (37 C) (Oral)   Resp 16   Ht 6' (1.829 m)   Wt 97.1 kg   SpO2 98%   BMI 29.02 kg/m   Physical Exam  Constitutional: He appears well-developed and well-nourished.  HENT:  Head: Atraumatic.  Neck: Normal range of motion.  Cardiovascular:  Pulses equal bilaterally  Musculoskeletal: He exhibits edema and tenderness.       Right knee: He exhibits decreased range of motion and effusion. He exhibits no deformity, no erythema and no bony tenderness.  Pt can flex/ext right knee to 90 degrees with discomfort.  No red streaking, no increased warmth.  Chronic appearing raised nodule in the popliteal space with a trace of clear yellow drainage around the base of this nodule with  pressure application.  Neurological: He is alert. He has normal strength. He displays normal reflexes. No sensory deficit.  Skin: Skin is warm and dry.  Psychiatric: He has a normal mood and affect.     ED Treatments / Results  Labs (Radiology Dg Knee Complete 4 Views Right  Result Date: 02/05/2016 CLINICAL DATA:  Right knee pain and burning which began goes tearing. History of prior knee replacement. No known injury. Initial encounter. EXAM: RIGHT KNEE - COMPLETE 4+ VIEW COMPARISON:  Two views of the right knee 03/23/2009. FINDINGS: The patient is status post right total knee arthroplasty. No fracture is seen. Hardware is intact. Prominent osteophytosis is seen about the knee and is new since the prior examination. There are likely loose bodies in the joint. Moderate joint effusion is identified. IMPRESSION: Negative for fracture. Status post right total knee replacement. Extensive osteophytosis about the knee is new since the prior examination. There are likely loose bodies in the joint. Moderate joint effusion. Electronically Signed   By: Inge Rise M.D.   On: 02/05/2016 18:41    Procedures Procedures (including critical care time)  Nodule was prepped in sterile fashion using alcohol swabs.  Attempt at  needle aspiration to collect fluid for culture with just a trace of clear yellow, then bloody aspirate obtained, not enough for culture.   Medications Ordered in ED Medications  ciprofloxacin (CIPRO) tablet 500 mg (500 mg Oral Given 02/05/16 1946)  HYDROcodone-acetaminophen (NORCO/VICODIN) 5-325 MG per tablet 1 tablet (1 tablet Oral Given 02/05/16 1946)     Initial Impression / Assessment and Plan / ED Course  I have reviewed the triage vital signs and the nursing notes.  Pertinent labs & imaging results that were available during my care of the patient were reviewed by me and considered in my medical decision making (see chart for details).  Clinical Course    Review of chart with pt having course of ciprofloxacin per Dr. Ronnie Derby last fall for prophylactic tx given pt has an open tract and potential for joint space infection.  There is no PE exam or hx evidence of joint infection today, but will cover with abx until can be re-evaluated by his surgeon.  Pt will call for appt tomorrow. Hydrocodone prescribed for pain.  Suggested knee immobilizer, crutches for sx relief and to help minimize reaccumulation of fluid.  Pt has these items at home and will utilize prn.  Dressing applied to aspiration site.  Final Clinical Impressions(s) / ED Diagnoses   Final diagnoses:  Knee effusion, right  Baker's cyst of knee, right    New Prescriptions Discharge Medication List as of 02/05/2016  7:40 PM    START taking these medications   Details  ciprofloxacin (CIPRO) 500 MG tablet Take 1 tablet (500 mg total) by mouth 2 (two) times daily., Starting Sun 02/05/2016, Print    HYDROcodone-acetaminophen (NORCO/VICODIN) 5-325 MG tablet Take 1 tablet by mouth every 4 (four) hours as needed., Starting Sun 02/05/2016, Print         Evalee Jefferson, PA-C 02/07/16 1339    Julianne Dotzler, MD 02/07/16 2314

## 2016-02-05 NOTE — ED Notes (Signed)
Patient transported to X-ray 

## 2016-02-05 NOTE — ED Triage Notes (Signed)
Right knee pain/burning that started yesterday. Denies recent injury. States pain increases when bending.

## 2016-02-05 NOTE — Discharge Instructions (Signed)
Take the entire course of antibiotics and use the hydrocodone as needed for pain relief.  Do not drive within 4 hours of taking this medication as it will make you drowsy.  Use your crutches and your home knee immobilizer as discussed.

## 2016-02-07 ENCOUNTER — Other Ambulatory Visit (HOSPITAL_COMMUNITY): Payer: Self-pay | Admitting: Orthopedic Surgery

## 2016-02-07 DIAGNOSIS — T84038D Mechanical loosening of other internal prosthetic joint, subsequent encounter: Secondary | ICD-10-CM

## 2016-02-07 DIAGNOSIS — Z96659 Presence of unspecified artificial knee joint: Principal | ICD-10-CM

## 2016-02-13 ENCOUNTER — Encounter (HOSPITAL_COMMUNITY)
Admission: RE | Admit: 2016-02-13 | Discharge: 2016-02-13 | Disposition: A | Discharge status: Home / Self Care | Payer: Medicare Other | Source: Ambulatory Visit | Attending: Orthopedic Surgery | Admitting: Orthopedic Surgery

## 2016-02-13 ENCOUNTER — Encounter (HOSPITAL_COMMUNITY): Payer: Self-pay

## 2016-02-13 DIAGNOSIS — Z96659 Presence of unspecified artificial knee joint: Secondary | ICD-10-CM

## 2016-02-13 DIAGNOSIS — Z96651 Presence of right artificial knee joint: Secondary | ICD-10-CM | POA: Diagnosis present

## 2016-02-13 DIAGNOSIS — M25561 Pain in right knee: Secondary | ICD-10-CM | POA: Insufficient documentation

## 2016-02-13 DIAGNOSIS — T84038D Mechanical loosening of other internal prosthetic joint, subsequent encounter: Secondary | ICD-10-CM | POA: Diagnosis present

## 2016-02-13 MED ORDER — TECHNETIUM TC 99M MEDRONATE IV KIT
20.0000 | PACK | Freq: Once | INTRAVENOUS | Status: AC | PRN
  Administered 2016-02-13: 21 via INTRAVENOUS

## 2016-02-21 ENCOUNTER — Encounter (HOSPITAL_COMMUNITY)
Admission: RE | Admit: 2016-02-21 | Discharge: 2016-02-21 | Disposition: A | Discharge status: Home / Self Care | Payer: Medicare Other | Source: Ambulatory Visit | Attending: Orthopedic Surgery | Admitting: Orthopedic Surgery

## 2016-02-21 ENCOUNTER — Encounter (HOSPITAL_COMMUNITY): Payer: Self-pay

## 2016-02-21 LAB — COMPREHENSIVE METABOLIC PANEL
ALBUMIN: 4.2 g/dL (ref 3.5–5.0)
ALT: 35 U/L (ref 17–63)
AST: 32 U/L (ref 15–41)
Alkaline Phosphatase: 109 U/L (ref 38–126)
Anion gap: 7 (ref 5–15)
BUN: 11 mg/dL (ref 6–20)
CHLORIDE: 108 mmol/L (ref 101–111)
CO2: 24 mmol/L (ref 22–32)
CREATININE: 0.95 mg/dL (ref 0.61–1.24)
Calcium: 8.7 mg/dL — ABNORMAL LOW (ref 8.9–10.3)
GFR calc Af Amer: >60 mL/min (ref >60)
GFR calc non Af Amer: >60 mL/min (ref >60)
GLUCOSE: 126 mg/dL — AB (ref 65–99)
POTASSIUM: 3.8 mmol/L (ref 3.5–5.1)
Sodium: 139 mmol/L (ref 135–145)
Total Bilirubin: 1.3 mg/dL — ABNORMAL HIGH (ref 0.3–1.2)
Total Protein: 6.4 g/dL — ABNORMAL LOW (ref 6.5–8.1)

## 2016-02-21 LAB — CBC
HEMATOCRIT: 45.7 % (ref 39.0–52.0)
Hemoglobin: 15.5 g/dL (ref 13.0–17.0)
MCH: 30.8 pg (ref 26.0–34.0)
MCHC: 33.9 g/dL (ref 30.0–36.0)
MCV: 90.7 fL (ref 78.0–100.0)
PLATELETS: 85 10*3/uL — AB (ref 150–400)
RBC: 5.04 MIL/uL (ref 4.22–5.81)
RDW: 13.2 % (ref 11.5–15.5)
WBC: 4.3 10*3/uL (ref 4.0–10.5)

## 2016-02-21 NOTE — Progress Notes (Signed)
   02/21/16 1416  OBSTRUCTIVE SLEEP APNEA  Have you ever been diagnosed with sleep apnea through a sleep study? No  Do you snore loudly (loud enough to be heard through closed doors)?  1  Do you often feel tired, fatigued, or sleepy during the daytime (such as falling asleep during driving or talking to someone)? 1  Has anyone observed you stop breathing during your sleep? 1  Do you have, or are you being treated for high blood pressure? 0  BMI more than 35 kg/m2? 0  Age > 50 (1-yes) 1  Neck circumference greater than:Male 16 inches or larger, Male 17inches or larger? 1  Male Gender (Yes=1) 1  Obstructive Sleep Apnea Score 6  Score 5 or greater  Results sent to PCP

## 2016-02-21 NOTE — Progress Notes (Signed)
Anesthesia Chart Review:  Pt is a 60 year old male scheduled for irrigation and debridement R knee, application of wound vac on 02/22/2016 with Vickey Huger, MD.   PMH includes: Asthma, cirrhosis, GERD. S/p R Baker's cyst excision 09/06/14. S/p repair incarcerated inguinal hernia 05/22/2013. Former smoker. BMI 30.   Preoperative labs reviewed.  Platelets 85. Prior results 105-121.  Notified Dr. Ruel Favors office of low platelets.   EKG 09/18/14: Sinus bradycardia (55 bpm). Minimal voltage criteria for LVH, may be normal variant. Cannot rule out Anterior infarct, age undetermined. Since previous tracing 07/04/12 LVH is new, rate is lower. Low R in V3 is read as anterior infarct but may just be lead placement.  If no changes, I anticipate pt can proceed with surgery as scheduled.   Willeen Cass, FNP-BC [facility]/Anesthesiology Phone: [phone] 02/21/2016 4:10 PM

## 2016-02-21 NOTE — Progress Notes (Signed)
PCP is Dr. [name] States he had an Echo many years ago, but doesn't remember the Dr that did it thinks it was done in 2006. States it was a normal Echo. Denies having any chest pain.

## 2016-02-21 NOTE — Pre-Procedure Instructions (Signed)
VIC ESCO  02/21/2016      [facility], [place] - [id number] [place] [address] Phone: [phone] Fax: [phone]    Your procedure is scheduled on Aug 23  Report to [facility] at 830 A.M.  Call this number if you have problems the morning of surgery:  [phone]   Remember:  Do not eat food or drink liquids after midnight.  Take these medicines the morning of surgery with A SIP OF WATER Pantoprazole (Prtotonix) and Hydrocodone (norco) if needed    Stop taking aspirin, BC's, Goody's, Ibuprofen, Advil, Motrin, Aleve, Herbal medications, Fish Oil, vitamins    Do not wear jewelry, make-up or nail polish.  Do not wear lotions, powders, or perfumes.  You may wear deoderant.  Do not shave 48 hours prior to surgery.  Men may shave face and neck.  Do not bring valuables to the hospital.  [facility] is not responsible for any belongings or valuables.  Contacts, dentures or bridgework may not be worn into surgery.  Leave your suitcase in the car.  After surgery it may be brought to your room.  For patients admitted to the hospital, discharge time will be determined by your treatment team.  Patients discharged the day of surgery will not be allowed to drive home.    Special instructions:  [facility] - Preparing for Surgery  Before surgery, you can play an important role.  Because skin is not sterile, your skin needs to be as free of germs as possible.  You can reduce the number of germs on you skin by washing with CHG (chlorahexidine gluconate) soap before surgery.  CHG is an antiseptic cleaner which kills germs and bonds with the skin to continue killing germs even after washing.  Please DO NOT use if you have an allergy to CHG or antibacterial soaps.  If your skin becomes reddened/irritated stop using the CHG and inform your nurse when you arrive at Short Stay.  Do not shave (including legs and  underarms) for at least 48 hours prior to the first CHG shower.  You may shave your face.  Please follow these instructions carefully:   1.  Shower with CHG Soap the night before surgery and the   morning of Surgery.  2.  If you choose to wash your hair, wash your hair first as usual with your  normal shampoo.  3.  After you shampoo, rinse your hair and body thoroughly to remove the Shampoo.  4.  Use CHG as you would any other liquid soap.  You can apply chg directly   to the skin and wash gently with scrungie or a clean washcloth.  5.  Apply the CHG Soap to your body ONLY FROM THE NECK DOWN.  Do not use on open wounds or open sores.  Avoid contact with your eyes, ears, mouth and genitals (private parts).  Wash genitals (private parts)  with your normal soap.  6.  Wash thoroughly, paying special attention to the area where your surgery  will be performed.  7.  Thoroughly rinse your body with warm water from the neck down.  8.  DO NOT shower/wash with your normal soap after using and rinsing off   the CHG Soap.  9.  Pat yourself dry with a clean towel.            10.  Wear clean pajamas.  11.  Place clean sheets on your bed the night of your first shower and do not sleep with pets.  Day of Surgery  Do not apply any lotions/deoderants the morning of surgery.  Please wear clean clothes to the hospital/surgery center.    Please read over the following fact sheets that you were given. Pain Booklet, Coughing and Deep Breathing and Surgical Site Infection Prevention

## 2016-02-22 ENCOUNTER — Encounter (HOSPITAL_COMMUNITY): Payer: Self-pay | Admitting: Certified Registered Nurse Anesthetist

## 2016-02-22 ENCOUNTER — Ambulatory Visit (HOSPITAL_COMMUNITY): Payer: Medicare Other | Admitting: Vascular Surgery

## 2016-02-22 ENCOUNTER — Inpatient Hospital Stay (HOSPITAL_COMMUNITY): Payer: Medicare Other

## 2016-02-22 ENCOUNTER — Ambulatory Visit (HOSPITAL_COMMUNITY): Payer: Medicare Other | Admitting: Certified Registered Nurse Anesthetist

## 2016-02-22 ENCOUNTER — Inpatient Hospital Stay (HOSPITAL_COMMUNITY)
Admission: AD | Admit: 2016-02-22 | Discharge: 2016-02-24 | DRG: 981 | Disposition: A | Discharge status: Home Health | Payer: Medicare Other | Source: Ambulatory Visit | Attending: Orthopedic Surgery | Admitting: Orthopedic Surgery

## 2016-02-22 ENCOUNTER — Encounter (HOSPITAL_COMMUNITY)
Admission: AD | Disposition: A | Discharge status: Home Health | Payer: Self-pay | Source: Ambulatory Visit | Attending: Orthopedic Surgery

## 2016-02-22 DIAGNOSIS — L80 Vitiligo: Secondary | ICD-10-CM | POA: Diagnosis present

## 2016-02-22 DIAGNOSIS — M7121 Synovial cyst of popliteal space [Baker], right knee: Secondary | ICD-10-CM

## 2016-02-22 DIAGNOSIS — Z452 Encounter for adjustment and management of vascular access device: Secondary | ICD-10-CM

## 2016-02-22 DIAGNOSIS — K7469 Other cirrhosis of liver: Secondary | ICD-10-CM | POA: Diagnosis not present

## 2016-02-22 DIAGNOSIS — I469 Cardiac arrest, cause unspecified: Secondary | ICD-10-CM | POA: Diagnosis present

## 2016-02-22 DIAGNOSIS — R739 Hyperglycemia, unspecified: Secondary | ICD-10-CM | POA: Diagnosis not present

## 2016-02-22 DIAGNOSIS — M7120 Synovial cyst of popliteal space [Baker], unspecified knee: Secondary | ICD-10-CM | POA: Diagnosis present

## 2016-02-22 DIAGNOSIS — D62 Acute posthemorrhagic anemia: Secondary | ICD-10-CM | POA: Diagnosis not present

## 2016-02-22 DIAGNOSIS — K219 Gastro-esophageal reflux disease without esophagitis: Secondary | ICD-10-CM | POA: Diagnosis present

## 2016-02-22 DIAGNOSIS — I5023 Acute on chronic systolic (congestive) heart failure: Secondary | ICD-10-CM | POA: Diagnosis not present

## 2016-02-22 DIAGNOSIS — K766 Portal hypertension: Secondary | ICD-10-CM | POA: Diagnosis present

## 2016-02-22 DIAGNOSIS — I959 Hypotension, unspecified: Secondary | ICD-10-CM | POA: Diagnosis not present

## 2016-02-22 DIAGNOSIS — I5020 Unspecified systolic (congestive) heart failure: Secondary | ICD-10-CM | POA: Diagnosis not present

## 2016-02-22 DIAGNOSIS — Z87891 Personal history of nicotine dependence: Secondary | ICD-10-CM | POA: Diagnosis not present

## 2016-02-22 DIAGNOSIS — D696 Thrombocytopenia, unspecified: Secondary | ICD-10-CM | POA: Diagnosis not present

## 2016-02-22 DIAGNOSIS — I4901 Ventricular fibrillation: Secondary | ICD-10-CM | POA: Diagnosis present

## 2016-02-22 DIAGNOSIS — Z880 Allergy status to penicillin: Secondary | ICD-10-CM | POA: Diagnosis not present

## 2016-02-22 DIAGNOSIS — J9621 Acute and chronic respiratory failure with hypoxia: Secondary | ICD-10-CM | POA: Diagnosis not present

## 2016-02-22 DIAGNOSIS — I5021 Acute systolic (congestive) heart failure: Secondary | ICD-10-CM | POA: Diagnosis not present

## 2016-02-22 DIAGNOSIS — I214 Non-ST elevation (NSTEMI) myocardial infarction: Secondary | ICD-10-CM | POA: Diagnosis present

## 2016-02-22 DIAGNOSIS — J9601 Acute respiratory failure with hypoxia: Secondary | ICD-10-CM | POA: Diagnosis not present

## 2016-02-22 DIAGNOSIS — K7460 Unspecified cirrhosis of liver: Secondary | ICD-10-CM | POA: Diagnosis present

## 2016-02-22 DIAGNOSIS — Z96653 Presence of artificial knee joint, bilateral: Secondary | ICD-10-CM | POA: Diagnosis present

## 2016-02-22 DIAGNOSIS — I9581 Postprocedural hypotension: Secondary | ICD-10-CM | POA: Diagnosis not present

## 2016-02-22 DIAGNOSIS — D6959 Other secondary thrombocytopenia: Secondary | ICD-10-CM | POA: Diagnosis present

## 2016-02-22 DIAGNOSIS — R579 Shock, unspecified: Secondary | ICD-10-CM

## 2016-02-22 DIAGNOSIS — M79604 Pain in right leg: Secondary | ICD-10-CM | POA: Diagnosis present

## 2016-02-22 HISTORY — DX: Cardiac arrest, cause unspecified: I46.9

## 2016-02-22 HISTORY — DX: Thrombocytopenia, unspecified: D69.6

## 2016-02-22 HISTORY — PX: IRRIGATION AND DEBRIDEMENT KNEE: SHX5185

## 2016-02-22 HISTORY — PX: TRANSTHORACIC ECHOCARDIOGRAM: SHX275

## 2016-02-22 HISTORY — PX: APPLICATION OF WOUND VAC: SHX5189

## 2016-02-22 LAB — COMPREHENSIVE METABOLIC PANEL
ALK PHOS: 116 U/L (ref 38–126)
ALT: 138 U/L — ABNORMAL HIGH (ref 17–63)
ANION GAP: 9 (ref 5–15)
AST: 169 U/L — ABNORMAL HIGH (ref 15–41)
Albumin: 3.5 g/dL (ref 3.5–5.0)
BILIRUBIN TOTAL: 1.5 mg/dL — AB (ref 0.3–1.2)
BUN: 14 mg/dL (ref 6–20)
CALCIUM: 8 mg/dL — AB (ref 8.9–10.3)
CO2: 21 mmol/L — ABNORMAL LOW (ref 22–32)
Chloride: 108 mmol/L (ref 101–111)
Creatinine, Ser: 1.26 mg/dL — ABNORMAL HIGH (ref 0.61–1.24)
GFR calc non Af Amer: 60 mL/min — ABNORMAL LOW (ref >60)
Glucose, Bld: 137 mg/dL — ABNORMAL HIGH (ref 65–99)
Potassium: 3.5 mmol/L (ref 3.5–5.1)
Sodium: 138 mmol/L (ref 135–145)
TOTAL PROTEIN: 5.6 g/dL — AB (ref 6.5–8.1)

## 2016-02-22 LAB — TROPONIN I
TROPONIN I: 0.38 ng/mL — AB (ref <0.03)
Troponin I: <0.03 ng/mL (ref <0.03)
Troponin I: 1.85 ng/mL (ref <0.03)

## 2016-02-22 LAB — ECHOCARDIOGRAM COMPLETE
CHL CUP MV DEC (S): 280
CHL CUP STROKE VOLUME: 50 mL
E/e' ratio: 6.71
EWDT: 280 ms
FS: 26 % — AB (ref 28–44)
IV/PV OW: 1.02
LA diam end sys: 32 mm
LA diam index: 1.42 cm/m2
LA vol A4C: 25.3 ml
LASIZE: 32 mm
LAVOL: 28.1 mL
LAVOLIN: 12.5 mL/m2
LDCA: 2.84 cm2
LV E/e'average: 6.71
LV PW d: 9.06 mm — AB (ref 0.6–1.1)
LV SIMPSON'S DISK: 50
LV dias vol: 100 mL (ref 62–150)
LV sys vol: 51 mL (ref 21–61)
LVDIAVOLIN: 44 mL/m2
LVEEMED: 6.71
LVELAT: 5.87 cm/s
LVOT VTI: 13.4 cm
LVOT diameter: 19 mm
LVOTPV: 73.1 cm/s
LVOTSV: 38 mL
LVSYSVOLIN: 22 mL/m2
Lateral S' vel: 10.1 cm/s
MV pk A vel: 71.6 m/s
MVPKEVEL: 39.4 m/s
RV TAPSE: 14.9 mm
Reg peak vel: 201 cm/s
TDI e' lateral: 5.87
TDI e' medial: 5.11
TR max vel: 201 cm/s

## 2016-02-22 LAB — POCT I-STAT 3, ART BLOOD GAS (G3+)
ACID-BASE DEFICIT: 3 mmol/L — AB (ref 0.0–2.0)
Acid-base deficit: 3 mmol/L — ABNORMAL HIGH (ref 0.0–2.0)
Acid-base deficit: 3 mmol/L — ABNORMAL HIGH (ref 0.0–2.0)
BICARBONATE: 22.6 meq/L (ref 20.0–24.0)
BICARBONATE: 22.8 meq/L (ref 20.0–24.0)
Bicarbonate: 24.2 mEq/L — ABNORMAL HIGH (ref 20.0–24.0)
O2 Saturation: 100 %
O2 Saturation: 99 %
O2 Saturation: 100 %
PCO2 ART: 42.1 mmHg (ref 35.0–45.0)
PH ART: 7.341 — AB (ref 7.350–7.450)
PO2 ART: 413 mmHg — AB (ref 80.0–100.0)
Patient temperature: 98.5
TCO2: 26 mmol/L (ref 0–100)
TCO2: 24 mmol/L (ref 0–100)
TCO2: 24 mmol/L (ref 0–100)
pCO2 arterial: 52.2 mmHg — ABNORMAL HIGH (ref 35.0–45.0)
pCO2 arterial: 40.5 mmHg (ref 35.0–45.0)
pH, Arterial: 7.274 — ABNORMAL LOW (ref 7.350–7.450)
pH, Arterial: 7.352 (ref 7.350–7.450)
pO2, Arterial: 238 mmHg — ABNORMAL HIGH (ref 80.0–100.0)
pO2, Arterial: 152 mmHg — ABNORMAL HIGH (ref 80.0–100.0)

## 2016-02-22 LAB — GLUCOSE, CAPILLARY
GLUCOSE-CAPILLARY: 143 mg/dL — AB (ref 65–99)
Glucose-Capillary: 153 mg/dL — ABNORMAL HIGH (ref 65–99)
Glucose-Capillary: 166 mg/dL — ABNORMAL HIGH (ref 65–99)

## 2016-02-22 LAB — CBC
HEMATOCRIT: 47.5 % (ref 39.0–52.0)
HEMATOCRIT: 46.6 % (ref 39.0–52.0)
HEMOGLOBIN: 16.1 g/dL (ref 13.0–17.0)
HEMOGLOBIN: 15.8 g/dL (ref 13.0–17.0)
MCH: 30.7 pg (ref 26.0–34.0)
MCH: 30.6 pg (ref 26.0–34.0)
MCHC: 33.9 g/dL (ref 30.0–36.0)
MCHC: 33.9 g/dL (ref 30.0–36.0)
MCV: 90.5 fL (ref 78.0–100.0)
MCV: 90.3 fL (ref 78.0–100.0)
Platelets: 83 10*3/uL — ABNORMAL LOW (ref 150–400)
Platelets: 114 10*3/uL — ABNORMAL LOW (ref 150–400)
RBC: 5.25 MIL/uL (ref 4.22–5.81)
RBC: 5.16 MIL/uL (ref 4.22–5.81)
RDW: 13.2 % (ref 11.5–15.5)
RDW: 13.4 % (ref 11.5–15.5)
WBC: 4 10*3/uL (ref 4.0–10.5)
WBC: 1.7 10*3/uL — ABNORMAL LOW (ref 4.0–10.5)

## 2016-02-22 LAB — TYPE AND SCREEN
ABO/RH(D): O POS
ANTIBODY SCREEN: NEGATIVE

## 2016-02-22 LAB — FIBRINOGEN: FIBRINOGEN: 244 mg/dL (ref 210–475)

## 2016-02-22 LAB — PROTIME-INR
INR: 1.21
Prothrombin Time: 15.4 seconds — ABNORMAL HIGH (ref 11.4–15.2)

## 2016-02-22 LAB — PHOSPHORUS: PHOSPHORUS: 4.7 mg/dL — AB (ref 2.5–4.6)

## 2016-02-22 LAB — LACTIC ACID, PLASMA: Lactic Acid, Venous: 2.5 mmol/L (ref 0.5–1.9)

## 2016-02-22 LAB — APTT: aPTT: 29 seconds (ref 24–36)

## 2016-02-22 LAB — MRSA PCR SCREENING: MRSA by PCR: NEGATIVE

## 2016-02-22 LAB — MAGNESIUM: Magnesium: 1.6 mg/dL — ABNORMAL LOW (ref 1.7–2.4)

## 2016-02-22 LAB — BRAIN NATRIURETIC PEPTIDE: B Natriuretic Peptide: 32.2 pg/mL (ref 0.0–100.0)

## 2016-02-22 SURGERY — IRRIGATION AND DEBRIDEMENT KNEE
Anesthesia: General | Site: Leg Lower | Laterality: Right

## 2016-02-22 MED ORDER — SODIUM CHLORIDE 0.9 % IV SOLN: 250.0000 mL | INTRAVENOUS | Status: DC | PRN

## 2016-02-22 MED ORDER — MIDAZOLAM HCL 2 MG/2ML IJ SOLN
INTRAMUSCULAR | Status: AC
  Filled 2016-02-22: qty 2

## 2016-02-22 MED ORDER — SODIUM CHLORIDE 0.9 % IR SOLN
Status: DC | PRN
  Administered 2016-02-22: 1000 mL

## 2016-02-22 MED ORDER — EPHEDRINE SULFATE 50 MG/ML IJ SOLN
INTRAMUSCULAR | Status: DC | PRN
  Administered 2016-02-22: 10 mg via INTRAVENOUS
  Administered 2016-02-22: 20 mg via INTRAVENOUS
  Administered 2016-02-22: 25 mg via INTRAVENOUS

## 2016-02-22 MED ORDER — PROPOFOL 10 MG/ML IV BOLUS
INTRAVENOUS | Status: DC | PRN
  Administered 2016-02-22: 200 mg via INTRAVENOUS

## 2016-02-22 MED ORDER — PROPOFOL 1000 MG/100ML IV EMUL
INTRAVENOUS | Status: AC
  Filled 2016-02-22: qty 100

## 2016-02-22 MED ORDER — CETYLPYRIDINIUM CHLORIDE 0.05 % MT LIQD
7.0000 mL | Freq: Two times a day (BID) | OROMUCOSAL | Status: DC
  Administered 2016-02-23: 7 mL via OROMUCOSAL

## 2016-02-22 MED ORDER — PHENYLEPHRINE HCL 10 MG/ML IJ SOLN
30.0000 ug/min | INTRAVENOUS | Status: DC
  Administered 2016-02-22: 30 ug/min via INTRAVENOUS
  Administered 2016-02-23: 20 ug/min via INTRAVENOUS
  Filled 2016-02-22 (×3): qty 1

## 2016-02-22 MED ORDER — LACTATED RINGERS IV SOLN
INTRAVENOUS | Status: DC
  Administered 2016-02-22 (×3): via INTRAVENOUS

## 2016-02-22 MED ORDER — ROCURONIUM BROMIDE 100 MG/10ML IV SOLN
INTRAVENOUS | Status: DC | PRN
  Administered 2016-02-22: 50 mg via INTRAVENOUS

## 2016-02-22 MED ORDER — FENTANYL CITRATE (PF) 100 MCG/2ML IJ SOLN
25.0000 ug | INTRAMUSCULAR | Status: DC | PRN
  Administered 2016-02-22 (×2): 50 ug via INTRAVENOUS
  Filled 2016-02-22 (×2): qty 2

## 2016-02-22 MED ORDER — ANTISEPTIC ORAL RINSE SOLUTION (CORINZ): 7.0000 mL | Freq: Four times a day (QID) | OROMUCOSAL | Status: DC

## 2016-02-22 MED ORDER — FAMOTIDINE IN NACL 20-0.9 MG/50ML-% IV SOLN
20.0000 mg | Freq: Two times a day (BID) | INTRAVENOUS | Status: DC
  Administered 2016-02-22 (×2): 20 mg via INTRAVENOUS
  Filled 2016-02-22 (×2): qty 50

## 2016-02-22 MED ORDER — EPHEDRINE SULFATE 50 MG/ML IJ SOLN: INTRAMUSCULAR | Status: DC | PRN

## 2016-02-22 MED ORDER — SUGAMMADEX SODIUM 500 MG/5ML IV SOLN
INTRAVENOUS | Status: AC
Start: 2016-02-22 — End: 2016-02-22
  Filled 2016-02-22: qty 5

## 2016-02-22 MED ORDER — PHENYLEPHRINE HCL 10 MG/ML IJ SOLN
INTRAMUSCULAR | Status: DC | PRN
  Administered 2016-02-22: 80 ug via INTRAVENOUS
  Administered 2016-02-22: 200 ug via INTRAVENOUS

## 2016-02-22 MED ORDER — LIDOCAINE 2% (20 MG/ML) 5 ML SYRINGE
INTRAMUSCULAR | Status: AC
  Filled 2016-02-22: qty 10

## 2016-02-22 MED ORDER — VANCOMYCIN HCL IN DEXTROSE 1-5 GM/200ML-% IV SOLN
1000.0000 mg | Freq: Once | INTRAVENOUS | Status: DC
  Filled 2016-02-22: qty 200

## 2016-02-22 MED ORDER — PROPOFOL 500 MG/50ML IV EMUL
INTRAVENOUS | Status: DC | PRN
  Administered 2016-02-22: 20 ug/kg/min via INTRAVENOUS

## 2016-02-22 MED ORDER — EPHEDRINE 5 MG/ML INJ
INTRAVENOUS | Status: AC
  Filled 2016-02-22: qty 20

## 2016-02-22 MED ORDER — FENTANYL CITRATE (PF) 100 MCG/2ML IJ SOLN
INTRAMUSCULAR | Status: AC
  Filled 2016-02-22: qty 2

## 2016-02-22 MED ORDER — PHENYLEPHRINE 40 MCG/ML (10ML) SYRINGE FOR IV PUSH (FOR BLOOD PRESSURE SUPPORT)
PREFILLED_SYRINGE | INTRAVENOUS | Status: AC
  Filled 2016-02-22: qty 20

## 2016-02-22 MED ORDER — HEPARIN SODIUM (PORCINE) 5000 UNIT/ML IJ SOLN
5000.0000 [IU] | Freq: Three times a day (TID) | INTRAMUSCULAR | Status: DC
  Administered 2016-02-22 – 2016-02-24 (×6): 5000 [IU] via SUBCUTANEOUS
  Filled 2016-02-22 (×6): qty 1

## 2016-02-22 MED ORDER — FENTANYL CITRATE (PF) 100 MCG/2ML IJ SOLN
INTRAMUSCULAR | Status: DC | PRN
  Administered 2016-02-22 (×2): 100 ug via INTRAVENOUS

## 2016-02-22 MED ORDER — LIDOCAINE HCL (CARDIAC) 20 MG/ML IV SOLN
INTRAVENOUS | Status: DC | PRN
  Administered 2016-02-22: 80 mg via INTRAVENOUS
  Administered 2016-02-22: 100 mg via INTRAVENOUS

## 2016-02-22 MED ORDER — LACTATED RINGERS IV BOLUS (SEPSIS)
1000.0000 mL | Freq: Once | INTRAVENOUS | Status: AC
  Administered 2016-02-22: 1000 mL via INTRAVENOUS

## 2016-02-22 MED ORDER — CHLORHEXIDINE GLUCONATE 0.12% ORAL RINSE (MEDLINE KIT)
15.0000 mL | Freq: Two times a day (BID) | OROMUCOSAL | Status: DC
  Administered 2016-02-22: 15 mL via OROMUCOSAL

## 2016-02-22 MED ORDER — EPINEPHRINE HCL 1 MG/ML IJ SOLN
INTRAMUSCULAR | Status: DC | PRN
  Administered 2016-02-22: 1 mg via INTRAVENOUS
  Administered 2016-02-22: .02 mg via INTRAVENOUS

## 2016-02-22 MED ORDER — VANCOMYCIN HCL 1000 MG IV SOLR
INTRAVENOUS | Status: DC | PRN
  Administered 2016-02-22: 100 mg via INTRAVENOUS

## 2016-02-22 MED ORDER — LIDOCAINE 2% (20 MG/ML) 5 ML SYRINGE
INTRAMUSCULAR | Status: AC
  Filled 2016-02-22: qty 5

## 2016-02-22 MED ORDER — MAGNESIUM SULFATE 50 % IJ SOLN
3.0000 g | Freq: Once | INTRAVENOUS | Status: AC
  Administered 2016-02-22: 3 g via INTRAVENOUS
  Filled 2016-02-22: qty 6

## 2016-02-22 MED ORDER — PROPOFOL 10 MG/ML IV BOLUS
INTRAVENOUS | Status: AC
  Filled 2016-02-22: qty 20

## 2016-02-22 MED ORDER — SODIUM CHLORIDE 0.9 % IV SOLN: INTRAVENOUS | Status: DC | PRN

## 2016-02-22 MED ORDER — PROPOFOL 1000 MG/100ML IV EMUL
5.0000 ug/kg/min | INTRAVENOUS | Status: DC
  Administered 2016-02-22 (×2): 40 ug/kg/min via INTRAVENOUS
  Filled 2016-02-22 (×2): qty 100

## 2016-02-22 MED ORDER — MIDAZOLAM HCL 5 MG/5ML IJ SOLN
INTRAMUSCULAR | Status: DC | PRN
  Administered 2016-02-22 (×2): 2 mg via INTRAVENOUS

## 2016-02-22 SURGICAL SUPPLY — 28 items
CANISTER WOUND CARE 500ML ATS (WOUND CARE) ×3 IMPLANT
COVER SURGICAL LIGHT HANDLE (MISCELLANEOUS) ×3 IMPLANT
DRAPE IMP U-DRAPE 54X76 (DRAPES) ×3 IMPLANT
DRAPE INCISE IOBAN 66X45 STRL (DRAPES) ×3 IMPLANT
DRAPE PROXIMA HALF (DRAPES) ×3 IMPLANT
DRAPE U-SHAPE 47X51 STRL (DRAPES) ×3 IMPLANT
DRSG VAC ATS SM SENSATRAC (GAUZE/BANDAGES/DRESSINGS) ×3 IMPLANT
DURAPREP 26ML APPLICATOR (WOUND CARE) ×3 IMPLANT
ELECT REM PT RETURN 9FT ADLT (ELECTROSURGICAL) ×3
ELECTRODE REM PT RTRN 9FT ADLT (ELECTROSURGICAL) ×1 IMPLANT
GLOVE BIO SURGEON STRL SZ8 (GLOVE) ×3 IMPLANT
GLOVE BIOGEL PI IND STRL 7.0 (GLOVE) ×1 IMPLANT
GLOVE BIOGEL PI IND STRL 8.5 (GLOVE) ×3 IMPLANT
GLOVE BIOGEL PI INDICATOR 7.0 (GLOVE) ×2
GLOVE BIOGEL PI INDICATOR 8.5 (GLOVE) ×6
GLOVE SURG ORTHO 7.0 STRL STRW (GLOVE) ×3 IMPLANT
GLOVE SURG ORTHO 8.0 STRL STRW (GLOVE) ×6 IMPLANT
GOWN STRL REUS W/ TWL LRG LVL3 (GOWN DISPOSABLE) ×1 IMPLANT
GOWN STRL REUS W/ TWL XL LVL3 (GOWN DISPOSABLE) ×3 IMPLANT
GOWN STRL REUS W/TWL LRG LVL3 (GOWN DISPOSABLE) ×2
GOWN STRL REUS W/TWL XL LVL3 (GOWN DISPOSABLE) ×6
KIT BASIN OR (CUSTOM PROCEDURE TRAY) ×3 IMPLANT
KIT ROOM TURNOVER OR (KITS) ×3 IMPLANT
NS IRRIG 1000ML POUR BTL (IV SOLUTION) ×3 IMPLANT
PACK TOTAL JOINT (CUSTOM PROCEDURE TRAY) ×3 IMPLANT
PAD ARMBOARD 7.5X6 YLW CONV (MISCELLANEOUS) ×6 IMPLANT
TOWEL OR 17X24 6PK STRL BLUE (TOWEL DISPOSABLE) ×3 IMPLANT
TOWEL OR 17X26 10 PK STRL BLUE (TOWEL DISPOSABLE) ×3 IMPLANT

## 2016-02-22 NOTE — Progress Notes (Signed)
Patient self-extubated at RT called to room. Georgann Housekeeper, NP at bedside and stated that we watch patient with 30 min ABG check. Ventilator turned off and RT will continue to monitor.

## 2016-02-22 NOTE — Transfer of Care (Signed)
Immediate Anesthesia Transfer of Care Note  Patient: Treasa School  Procedure(s) Performed: Procedure(s): IRRIGATION AND DEBRIDEMENT KNEE (Right) APPLICATION OF WOUND VAC (Right)  Patient Location: ICU  Anesthesia Type:General  Level of Consciousness: Patient remains intubated per anesthesia plan  Airway & Oxygen Therapy: Patient remains intubated per anesthesia plan and Patient placed on Ventilator (see vital sign flow sheet for setting)  Post-op Assessment: Report given to RN and Post -op Vital signs reviewed and stable  Post vital signs: Reviewed and stable  Last Vitals:  Vitals:   02/22/16 0945 02/22/16 1317  BP: (!) 161/93 110/84  Pulse: 71 99  Resp: 20 15  Temp: 36.9 C     Last Pain:  Vitals:   02/22/16 0945  TempSrc: Oral      Patients Stated Pain Goal: 8 (39/03/00 9233)  Complications: unexpected post-op hospitalization   Transported to PACU with Dr. Gifford Shave at bedside.  VS remained stable throughout transport with SR on monitor hr 90's. BP 110/80.  Bag mask ventilator with 100% 02 10-12bpm

## 2016-02-22 NOTE — Progress Notes (Signed)
CRITICAL VALUE ALERT  Critical value received:  Trop 0.38  Date of notification:  02/22/16  Time of notification:  3779  Critical value read back:Yes.    Nurse who received alert:  Jake Bathe, RN  MD notified (1st page):    Time of first page:    MD notified (2nd page):  Time of second page:  Responding MD:  Ashok Cordia, MD  Time MD responded:  50  Dr. Ashok Cordia at bedside, made aware of elevated troponin. Patient to have an ECHO.  Rowe Pavy, RN

## 2016-02-22 NOTE — Progress Notes (Signed)
Echocardiogram 2D Echocardiogram has been performed.  Aggie Cosier 02/22/2016, 5:15 PM

## 2016-02-22 NOTE — Anesthesia Postprocedure Evaluation (Signed)
Anesthesia Post Note  Patient: Stephen Burch  Procedure(s) Performed: Procedure(s) (LRB): IRRIGATION AND DEBRIDEMENT KNEE (Right) APPLICATION OF WOUND VAC (Right)  Patient location during evaluation: SICU Anesthesia Type: General Level of consciousness: sedated and patient remains intubated per anesthesia plan Pain management: pain level controlled Vital Signs Assessment: post-procedure vital signs reviewed and stable Respiratory status: patient remains intubated per anesthesia plan Cardiovascular status: stable Anesthetic complications: yes Anesthetic complication details: adverse drug reactionComments: Intra-operative code, likely from vancomycin administration with ROSC after 1 round of CPR and defibrillation x1.  Transported to SICU intubated, sedated.    Last Vitals:  Vitals:   02/22/16 1317 02/22/16 1400  BP: 110/84   Pulse: 99 (!) 117  Resp: 15 17  Temp:  36.4 C    Last Pain:  Vitals:   02/22/16 1400  TempSrc: Axillary                 Catalina Gravel

## 2016-02-22 NOTE — Addendum Note (Signed)
Addendum  created 02/22/16 1653 by Oletta Lamas, CRNA   Anesthesia Event edited, Anesthesia Intra Flowsheets edited, Anesthesia Intra Meds edited

## 2016-02-22 NOTE — Progress Notes (Signed)
PCCM Attending Note: Bedside nurse notified of persistent hypotension w/ CVP 8. Serum magnesium 1.5 & creatinine 1.2. Ordering 1L LR bolus over 1 hour. Magnesium Sulfate 3gm IV now.   Sonia Baller Ashok Cordia, M.D. [facility]monary [facility] Pager:  [phone] After 3pm or if no response, call [phone] 6:49 PM 02/22/16

## 2016-02-22 NOTE — Progress Notes (Signed)
eLink Physician-Brief Progress Note Patient Name: Stephen Burch DOB: 18-Sep-1955 MRN: [id number]   Date of Service  02/22/2016  HPI/Events of Note  ABG on 40%/PRVC 18/TV 620/P 5 = 7.35/40.5/152/22.6.  eICU Interventions  Continue present management.         Sommer,Steven Cornelia Copa 02/22/2016, 5:07 PM

## 2016-02-22 NOTE — Anesthesia Preprocedure Evaluation (Addendum)
Anesthesia Evaluation  Patient identified by MRN, date of birth, ID band Patient awake    Reviewed: Allergy & Precautions, NPO status , Patient's Chart, lab work & pertinent test results  Airway Mallampati: II  TM Distance: >3 FB Neck ROM: Full    Dental  (+) Teeth Intact, Dental Advisory Given   Pulmonary asthma , former smoker,    Pulmonary exam normal breath sounds clear to auscultation       Cardiovascular Exercise Tolerance: Good negative cardio ROS Normal cardiovascular exam Rhythm:Regular Rate:Normal  EKG 09/18/14: Sinus bradycardia (55 bpm). Minimal voltage criteria for LVH, may be normal variant. Cannot rule out Anterior infarct, age undetermined. Since previous tracing 07/04/12 LVH is new, rate is lower. Low R in V3 is read as anterior infarct but may just be lead placement.   Neuro/Psych negative neurological ROS     GI/Hepatic hiatal hernia, GERD  Medicated,(+) Cirrhosis       ,   Endo/Other  negative endocrine ROS  Renal/GU negative Renal ROS     Musculoskeletal  (+) Arthritis , Osteoarthritis,    Abdominal   Peds  Hematology  (+) Blood dyscrasia (thrombocytopenia), ,   Anesthesia Other Findings Day of surgery medications reviewed with the patient.  Reproductive/Obstetrics                            Anesthesia Physical Anesthesia Plan  ASA: II  Anesthesia Plan: General   Post-op Pain Management:    Induction: Intravenous  Airway Management Planned: LMA  Additional Equipment:   Intra-op Plan:   Post-operative Plan: Extubation in OR  Informed Consent: I have reviewed the patients History and Physical, chart, labs and discussed the procedure including the risks, benefits and alternatives for the proposed anesthesia with the patient or authorized representative who has indicated his/her understanding and acceptance.   Dental advisory given  Plan Discussed with:  CRNA  Anesthesia Plan Comments: (Risks/benefits of general anesthesia discussed with patient including risk of damage to teeth, lips, gum, and tongue, nausea/vomiting, allergic reactions to medications, and the possibility of heart attack, stroke and death.  All patient questions answered.  Patient wishes to proceed.)       Anesthesia Quick Evaluation

## 2016-02-22 NOTE — Progress Notes (Signed)
Pt self extubated, pt immediately placed on 15L NRB. Propofol stopped. Elink MD notified, RT notified, Georgann Housekeeper, NP at bedside. Verbal orders to check ABG in 30 minutes. Will continue to closely monitor.  Sherlie Ban, RN

## 2016-02-22 NOTE — Progress Notes (Signed)
Spoke with Carlyon Shadow, PA. Gave verbal order to please consult CCM.  Dr. Ashok Cordia, MD made aware.  Rowe Pavy, RN

## 2016-02-22 NOTE — H&P (Signed)
Stephen Burch MRN:  [id number] DOB/SEX:  1955-11-16/male  CHIEF COMPLAINT:  Painful right leg  HISTORY: Patient is a 60 y.o. male presented with a history of pain in the right leg. Onset of symptoms was gradual starting a few months ago with gradually worsening course since that time. Patient has been treated conservatively with over-the-counter NSAIDs and activity modification. Patient currently rates pain in the knee at 8 out of 10 with activity. There is pain at night.  PAST MEDICAL HISTORY: Patient Active Problem List   Diagnosis Date Noted  . Vitiligo 08/22/2015  . AD (atopic dermatitis) 08/22/2015  . Osteoarthritis of right knee 08/22/2015  . Cirrhosis (HCC) 08/22/2015  . Hyperglycemia 08/22/2015  . GERD (gastroesophageal reflux disease) 08/22/2015  . Ganglion of left wrist 08/22/2015  . ED (erectile dysfunction) of organic origin 08/22/2015  . Asthma 08/22/2015  . Dysplastic nodule of liver 08/22/2015  . Baker cyst 09/06/2014  . H/O total knee replacement 08/05/2014  . Bilateral inguinal hernia 08/01/2012  . Splenomegaly 08/01/2012  . Portal hypertension (HCC) 08/01/2012  . Gallstones 08/01/2012   Past Medical History:  Diagnosis Date  . Arthritis    knees  . Asthma    as a kid  . Cirrhosis (HCC)   . GERD (gastroesophageal reflux disease)   . History of hiatal hernia   . Kidney stones   . Pneumonia    3 years ago   Past Surgical History:  Procedure Laterality Date  . COLONOSCOPY    . CT SCAN of Abdomen  06/27/2012   Cirrhosis with portal hypertension. Marked slenomegaly. Bilateral inguinal hernias. Right inguinal hernia contains part of the bladder. Report from [facility]  . EAR CYST EXCISION Right 09/06/2014   Procedure: OPEN EXCISION BAKER'S CYST RIGHT KNEE;  Surgeon: Dannielle Huh, MD;  Location: [facility] OR;  Service: Orthopedics;  Laterality: Right;  . ESOPHAGEAL DILATION  1996  . ESOPHAGOGASTRODUODENOSCOPY N/A 09/17/2012   Procedure:  ESOPHAGOGASTRODUODENOSCOPY (EGD);  Surgeon: Malissa Hippo, MD;  Location: [facility] ENDO SUITE;  Service: Endoscopy;  Laterality: N/A;  200  . ESOPHAGOGASTRODUODENOSCOPY N/A 12/15/2015   Procedure: ESOPHAGOGASTRODUODENOSCOPY (EGD);  Surgeon: Malissa Hippo, MD;  Location: [facility] ENDO SUITE;  Service: Endoscopy;  Laterality: N/A;  1245  . Exercise tredmill test  03/27/2005   Normal  . HERNIA REPAIR Bilateral 05/22/2013, 11/1997   x 2 (Inguinal hernia) Dr. Derrell Lolling, Dr. Achilles Dunk  . INGUINAL HERNIA REPAIR Bilateral 05/22/2013   Procedure: REPAIR OF RECURRENT INCARCERATED INGUINAL HERNIA WITH MESH RIGHT SIDE,  REPAIR OF RECURRENT INGUINAL HERNIA WITH MESH LEFT SIDE;  Surgeon: Ernestene Mention, MD;  Location: [facility] OR;  Service: General;  Laterality: Bilateral;  . INSERTION OF MESH Bilateral 05/22/2013   Procedure: INSERTION OF MESH;  Surgeon: Ernestene Mention, MD;  Location: [facility] OR;  Service: General;  Laterality: Bilateral;  . Irrigation and Debridement right knee  03/23/2009   Dr. Hyacinth Meeker, [facility]  . JOINT REPLACEMENT  03/16/2009   RIGHT KNEE REPLACEMENT  . KNEE ARTHROSCOPY WITH EXCISION BAKER'S CYST Right 10/01/14  . Radionuclide Imaging Study     Normal LV systolic function with ejection of 50%; Normal myocardial perfusion without evidence of myocardial ischemia  . REPLACEMENT TOTAL KNEE     Bilateral knee replacement, 2010, 2012  . TONSILLECTOMY       MEDICATIONS:   Prescriptions Prior to Admission  Medication Sig Dispense Refill Last Dose  . pantoprazole (PROTONIX) 40 MG tablet Take 1 tablet (40 mg total) by mouth daily.  30 tablet 11 02/21/2016 at Unknown time  . ciprofloxacin (CIPRO) 500 MG tablet Take 1 tablet (500 mg total) by mouth 2 (two) times daily. (Patient not taking: Reported on 02/20/2016) 14 tablet 0 Completed Course at Unknown time  . HYDROcodone-acetaminophen (NORCO/VICODIN) 5-325 MG tablet Take 1 tablet by mouth every 4 (four) hours as needed. (Patient not taking: Reported on 02/20/2016) 20 tablet  0 Not Taking at Unknown time  . lactulose (CHRONULAC) 10 GM/15ML solution Take 30 mLs (20 g total) by mouth 2 (two) times daily. (Patient not taking: Reported on 02/20/2016) 1892 mL 0 Not Taking at Unknown time    ALLERGIES:   Allergies  Allergen Reactions  . Aspirin Shortness Of Breath  . Penicillins Shortness Of Breath and Rash    Has patient had a PCN reaction causing immediate rash, facial/tongue/throat swelling, SOB or lightheadedness with hypotension: Yes Has patient had a PCN reaction causing severe rash involving mucus membranes or skin necrosis: No Has patient had a PCN reaction that required hospitalization No Has patient had a PCN reaction occurring within the last 10 years: No If all of the above answers are "NO", then may proceed with Cephalosporin use.     REVIEW OF SYSTEMS:  A comprehensive review of systems was negative except for: Musculoskeletal: positive for swelling   FAMILY HISTORY:   Family History  Problem Relation Age of Onset  . Diabetes Mother     type 2  . Arthritis Father   . GI Bleed Father     upper GI Bleed  . Arthritis Brother   . Diabetes Brother     type 2  . Diabetes Paternal Uncle     type 2  . Heart attack Maternal Grandmother   . Heart attack Maternal Grandfather   . Colon cancer Neg Hx   . Colon polyps Neg Hx     SOCIAL HISTORY:   Social History  Substance Use Topics  . Smoking status: Former Smoker    Packs/day: 0.25    Years: 3.00    Types: Cigarettes  . Smokeless tobacco: Never Used     Comment: none since age 66-1 pack per week when he was smoking   . Alcohol use No     EXAMINATION:  Vital signs in last 24 hours: Temp:  [98.4 F (36.9 C)] 98.4 F (36.9 C) (08/23 0945) Pulse Rate:  [71-90] 71 (08/23 0945) Resp:  [18-20] 20 (08/23 0945) BP: (131-161)/(76-93) 161/93 (08/23 0945) SpO2:  [97 %-98 %] 98 % (08/23 0945) Weight:  [97.9 kg (215 lb 14.4 oz)] 97.9 kg (215 lb 14.4 oz) (08/22 1407)  BP (!) 161/93   Pulse 71    Temp 98.4 F (36.9 C) (Oral)   Resp 20   SpO2 98%   General Appearance:    Alert, cooperative, no distress, appears stated age  Head:    Normocephalic, without obvious abnormality, atraumatic  Eyes:    PERRL, conjunctiva/corneas clear, EOM's intact, fundi    benign, both eyes       Ears:    Normal TM's and external ear canals, both ears  Nose:   Nares normal, septum midline, mucosa normal, no drainage    or sinus tenderness  Throat:   Lips, mucosa, and tongue normal; teeth and gums normal  Neck:   Supple, symmetrical, trachea midline, no adenopathy;       thyroid:  No enlargement/tenderness/nodules; no carotid   bruit or JVD  Back:     Symmetric, no curvature, ROM  normal, no CVA tenderness  Lungs:     Clear to auscultation bilaterally, respirations unlabored  Chest wall:    No tenderness or deformity  Heart:    Regular rate and rhythm, S1 and S2 normal, no murmur, rub   or gallop  Abdomen:     Soft, non-tender, bowel sounds active all four quadrants,    no masses, no organomegaly  Genitalia:    Normal male without lesion, discharge or tenderness  Rectal:    Normal tone, normal prostate, no masses or tenderness;   guaiac negative stool  Extremities:   Extremities normal, atraumatic, no cyanosis or edema  Pulses:   2+ and symmetric all extremities  Skin:   Skin color, texture, turgor normal, no rashes or lesions  Lymph nodes:   Cervical, supraclavicular, and axillary nodes normal  Neurologic:   CNII-XII intact. Normal strength, sensation and reflexes      throughout     Musculoskeletal:  ROM 0-120, Ligaments intact,    Assessment/Plan: Recurrent bakers cyst, right leg   The patient history, physical examination and imaging studies are consistent with recurrent bakers cyst right leg. The patient has failed conservative treatment.  The clearance notes were reviewed.  After discussion with the patient it was felt that incision and drainage with wound vac application was  appropriate. The procedure,  risks, and benefits were presented and reviewed. The risks including but not limited to infection, blood clots, vascular injury, stiffness, patella tracking problems complications among others were discussed. The patient acknowledged the explanation, agreed to proceed with the plan. Guy Sandifer 02/22/2016, 11:49 AM

## 2016-02-22 NOTE — Progress Notes (Addendum)
Pt remains hypotensive Lactic acid reported as 2.5. This is NOT SEPSIS.  Favor hypotension is a mix of sedation, cardiac fxn following VT arrest and possibly volume.   Plan Central line emergently placed Will obtain CVP w/ goal 8-12 Start neo gtt for MAP >65 Echo pending Will hold off from extubation at this point to let things settle s/p arrest   Erick Colace ACNP-BC [place] Pager # [phone] OR # [phone] if no answer

## 2016-02-22 NOTE — Procedures (Signed)
Central Venous Catheter Insertion Procedure Note Stephen Burch [id number] Dec 31, 1955  Procedure: Insertion of Central Venous Catheter Indications: Assessment of intravascular volume, Drug and/or fluid administration and Frequent blood sampling  Procedure Details Consent: Unable to obtain consent because of emergent medical necessity. Time Out: Verified patient identification, verified procedure, site/side was marked, verified correct patient position, special equipment/implants available, medications/allergies/relevent history reviewed, required imaging and test results available.  Performed  Maximum sterile technique was used including antiseptics, cap, gloves, gown, hand hygiene, mask and sheet. Skin prep: Chlorhexidine; local anesthetic administered A antimicrobial bonded/coated triple lumen catheter was placed in the right subclavian vein using the Seldinger technique.  Evaluation Blood flow good Complications: No apparent complications Patient did tolerate procedure well. Chest X-ray ordered to verify placement.  CXR: pending.  Erick Colace ACNP-BC [facility] Pager # [phone] OR # [phone] if no answer  Clementeen Graham 02/22/2016, 4:00 PM

## 2016-02-22 NOTE — Progress Notes (Signed)
CRITICAL VALUE ALERT  Critical value received:  Lactic acid 2.5  Date of notification:  02/22/16   Time of notification:  3:33 PM   Critical value read back: yes  Nurse who received alert: Sherrie Mustache   MD notified (1st page):  MD at bedside  Time of first page:  MD at bedside  MD notified (2nd page): MD at bedside  Time of second page: 3:34 PM   Responding MD:  Salvadore Dom  Time MD responded: 3:34 PM

## 2016-02-22 NOTE — Anesthesia Procedure Notes (Signed)
Procedure Name: Intubation Date/Time: 02/22/2016 12:05 PM Performed by: Melina Copa, Caylen Kuwahara R Pre-anesthesia Checklist: Patient identified, Emergency Drugs available, Suction available and Patient being monitored Patient Re-evaluated:Patient Re-evaluated prior to inductionOxygen Delivery Method: Circle System Utilized Preoxygenation: Pre-oxygenation with 100% oxygen Intubation Type: IV induction Ventilation: Mask ventilation without difficulty Laryngoscope Size: Mac and 4 Grade View: Grade I Tube type: Oral Tube size: 7.0 mm Number of attempts: 1 Airway Equipment and Method: Stylet Placement Confirmation: ETT inserted through vocal cords under direct vision,  positive ETCO2 and breath sounds checked- equal and bilateral Secured at: 23 cm Tube secured with: Tape Dental Injury: Teeth and Oropharynx as per pre-operative assessment

## 2016-02-22 NOTE — Op Note (Signed)
Dictation number:  [id number]

## 2016-02-22 NOTE — Progress Notes (Signed)
Pt able to answer questions appropriately, states he is in the hospital and his "surgery went bad." Will continue to monitor closely.  Sherlie Ban, RN

## 2016-02-22 NOTE — Consult Note (Signed)
PULMONARY / CRITICAL CARE MEDICINE   Name: KAIAN Burch MRN: [id number] DOB: 10-27-1955    ADMISSION DATE:  02/22/2016 CONSULTATION DATE:  02/22/16  REFERRING MD:  Dr. Sherlean Foot  CHIEF COMPLAINT:  VF Arrest   HISTORY OF PRESENT ILLNESS:   60 y/o M with PMH of arthritis s/p total knee replacement, GERD, inguinal hiatal hernia repair, renal stones, cirrhosis, pneumonia and asthma (not defined) who presented to [facility] on 8/23 for planned Bakers Cyst removal.    Per chart review, the patient has been followed by [facility] for right leg pain that began several months before admission with a gradual worsening.  He had been treated with NSAIDs and activity modification without relief.    The patient was admitted 8/23 for planned removal of Bakers cyst.  He was intubated per anesthesia (grade I view with a MAC 4) without noted difficulty.  Per report, during the case he developed a VF arrest requiring shock x1.  No other details available at this time.    PCCM called for assistance.    PAST MEDICAL HISTORY :  He  has a past medical history of Arthritis; Asthma; Cirrhosis (HCC); GERD (gastroesophageal reflux disease); History of hiatal hernia; Kidney stones; and Pneumonia.  PAST SURGICAL HISTORY: He  has a past surgical history that includes Replacement total knee; Esophagogastroduodenoscopy (N/A, 09/17/2012); Tonsillectomy; Inguinal hernia repair (Bilateral, 05/22/2013); Insertion of mesh (Bilateral, 05/22/2013); Ear Cyst Excision (Right, 09/06/2014); Knee arthroscopy with excision baker's cyst (Right, 10/01/14); Irrigation and Debridement right knee (03/23/2009); Joint replacement (03/16/2009); Hernia repair (Bilateral, 05/22/2013, 11/1997); Esophageal dilation (1996); CT SCAN of Abdomen (06/27/2012); Radionuclide Imaging Study; Exercise tredmill test (03/27/2005); Esophagogastroduodenoscopy (N/A, 12/15/2015); and Colonoscopy.  Allergies  Allergen Reactions  . Aspirin Shortness Of Breath  . Penicillins  Shortness Of Breath and Rash    Has patient had a PCN reaction causing immediate rash, facial/tongue/throat swelling, SOB or lightheadedness with hypotension: Yes Has patient had a PCN reaction causing severe rash involving mucus membranes or skin necrosis: No Has patient had a PCN reaction that required hospitalization No Has patient had a PCN reaction occurring within the last 10 years: No If all of the above answers are "NO", then may proceed with Cephalosporin use.     No current facility-administered medications on file prior to encounter.    Current Outpatient Prescriptions on File Prior to Encounter  Medication Sig  . pantoprazole (PROTONIX) 40 MG tablet Take 1 tablet (40 mg total) by mouth daily.  . ciprofloxacin (CIPRO) 500 MG tablet Take 1 tablet (500 mg total) by mouth 2 (two) times daily. (Patient not taking: Reported on 02/20/2016)  . HYDROcodone-acetaminophen (NORCO/VICODIN) 5-325 MG tablet Take 1 tablet by mouth every 4 (four) hours as needed. (Patient not taking: Reported on 02/20/2016)  . lactulose (CHRONULAC) 10 GM/15ML solution Take 30 mLs (20 g total) by mouth 2 (two) times daily. (Patient not taking: Reported on 02/20/2016)    FAMILY HISTORY:  His indicated that his mother is alive. He indicated that his father is deceased. He indicated that his sister is alive. He indicated that his brother is alive. He indicated that his maternal grandmother is deceased. He indicated that his maternal grandfather is deceased. He indicated that his paternal uncle is deceased. He indicated that the status of his neg hx is unknown.    SOCIAL HISTORY: He  reports that he has quit smoking. His smoking use included Cigarettes. He has a 0.75 pack-year smoking history. He has never used smokeless tobacco. He reports  that he does not drink alcohol or use drugs.  REVIEW OF SYSTEMS:  Unable to complete as patient is on mechanical ventilation   SUBJECTIVE:  RN reports pt follows commands  appropriately when off propofol   VITAL SIGNS: BP 110/84   Pulse 99   Temp 98.4 F (36.9 C) (Oral)   Resp 15   SpO2 100%   HEMODYNAMICS:    VENTILATOR SETTINGS: Vent Mode: PRVC FiO2 (%):  [100 %] 100 % Set Rate:  [16 bmp] 16 bmp Vt Set:  [620 mL] 620 mL PEEP:  [5 cmH20] 5 cmH20 Plateau Pressure:  [21 cmH20] 21 cmH20  INTAKE / OUTPUT: No intake/output data recorded.  PHYSICAL EXAMINATION: General:  Sedated. No acute distress. Family now at bedside.  Integument:  Warm & dry. No rash on exposed skin.  Lymphatics:  No appreciated cervical or supraclavicular lymphadenoapthy. HEENT:  Moist mucus membranes. No scleral injection or icterus. Endotracheal tube in place. PERRL. Cardiovascular:  Regular rate. No edema. No appreciable JVD.  Pulmonary:  Good aeration & clear to auscultation bilaterally. Symmetric chest wall rise on ventilator. Abdomen: Soft. Normal bowel sounds. Protuberant. Musculoskeletal:  Normal bulk and tone. No joint deformity or effusion appreciated. Neurological:  Face symmetric. Spontaneously moving all 4 extremities equally. Patient does not to questions with lightening of sedation. Psychiatric:  Unable to assess given intubation and sedation.  LABS:  BMET  Recent Labs Lab 02/21/16 1428  NA 139  K 3.8  CL 108  CO2 24  BUN 11  CREATININE 0.95  GLUCOSE 126*    Electrolytes  Recent Labs Lab 02/21/16 1428  CALCIUM 8.7*    CBC  Recent Labs Lab 02/21/16 1428 02/22/16 1250  WBC 4.3 4.0  HGB 15.5 16.1  HCT 45.7 47.5  PLT 85* 83*    Coag's No results for input(s): APTT, INR in the last 168 hours.  Sepsis Markers No results for input(s): LATICACIDVEN, PROCALCITON, O2SATVEN in the last 168 hours.  ABG No results for input(s): PHART, PCO2ART, PO2ART in the last 168 hours.  Liver Enzymes  Recent Labs Lab 02/21/16 1428  AST 32  ALT 35  ALKPHOS 109  BILITOT 1.3*  ALBUMIN 4.2    Cardiac Enzymes No results for input(s):  TROPONINI, PROBNP in the last 168 hours.  Glucose No results for input(s): GLUCAP in the last 168 hours.  Imaging No results found.   STUDIES:  EKG 8/23:  Sinus Tach w/ QTc . Questionable Q-waves in III & AVF. No obvious ischemia. Port CXR 8/23 >> TTE >>  MICROBIOLOGY: MRSA PCR 8/23 >>  ANTIBIOTICS: Vancomycin  SIGNIFICANT EVENTS: 8/23  Admit for planned removal of Baker's cyst, VF arrest during case with shock x1.  Returned to ICU   LINES/TUBES: OETT 8/23 >>  PIV x2 8/23  L Rad ALine 8/23 >>   DISCUSSION: 59 y/o M with PMH of asthma, GERD, cirrhosis and known Baker's cyst admitted 8/23 for planned removal.  Intubated without difficulty.  During case developed reported VF arrest requiring shock x1, ? Reaction to vancomycin.  Returned to ICU on vent.    ASSESSMENT / PLAN:  PULMONARY A: Acute Hypoxic Respiratory Failure - post cardiac arrest Asthma Former Smoker   P:   PRVC 8 cc/kg Wean PEEP / FiO2 for sats > 92% STAT CXR Daily SBT / WUA   CARDIOVASCULAR A:  VF Arrest - reported VF in OR, shock x1 (no notes available) Questionable Reaction to Vancomycin - per OR staff  P:  Assess  EKG to r/o intraoperative MI  Trend Troponin q6hr ICU monitoring of hemodynamics Assess ECHO   Assess BNP, triglycerides (propofol)  RENAL A:   At Risk AKI - in setting of cardiac arrest   P:   Trend BMP / UOP  Replace electrolytes as indicated  Avoid nephrotoxic agents  Now CMP, lactic acid, magnesium, phosphorous Gentle hydration, LR at 50 ml/hr  GASTROINTESTINAL A:   Hx Cirrhosis, GERD   P:   Pepcid BID for stress ulcer prophylaxis  NPO  Consider TF in am if remains intubated  HOLD home lactulose, consider restart in am 8/24  HEMATOLOGIC A:   Thrombocytopenia - Likely due to cirrhosis.  P:  Trend CBC Type and Cross  Now CBC, fibronigen, PTT Heparin SQ for DVT prophylaxis    INFECTIOUS A:   No evidence of acute infectious process   P:    Monitor CXR  Trend fever curve / WBC  Holding on further Vancomycin  ENDOCRINE A:   At Risk Hyper / Hypoglycemia    P:   Monitor glucose on BMP   NEUROLOGIC A:   Post Cardiac Arrest - woke post arrest, follows commands appropriately  P:   RASS goal: 0 to -1  Frequent neuro exams  Daily WUA   Propofol for sedation  Fentanyl for pain    FAMILY  - Updates:  Wife updated at bedside per Dr. Jamison Neighbor 8/23  - Inter-disciplinary family meet or Palliative Care meeting due by:  8/30   Canary Brim, NP-C [facility] Pgr: [phone] or if no answer [phone] 02/22/2016, 1:41 PM   PCCM Attending Note: Patient seen and examined with nurse practitioner. Please refer to her consultation note which I have reviewed in detail. Patient reportedly suffered cardiac arrest with ventricular fibrillation intraoperatively. Question if this could've been secondary to vancomycin. Family updated by myself at bedside. I did discuss the events intraoperative with Dr. Sherlean Foot. Checking electrolytes & trending cardiac biomarkers. Also checking transthoracic echocardiogram. Holding off on initiating antiarrhythmic given QTc 470 ms. Continuing sedation for now with spontaneous breathing trial in the morning. Hopefully we'll be able to extubate in the morning. Ventilator rate increased for hypercarbia likely secondary to hypoventilation post arrest.  I spent a total of 31 minutes of critical care time today caring for the patient, reviewing the patient's electronic medical record, discussing the intraoperative events with Dr. Sherlean Foot, updating family at bedside, and reviewing the patient's electronic medical record.  Donna Christen Jamison Neighbor, M.D. [facility] Pager:  [phone] After 3pm or if no response, call [phone] 2:52 PM 02/22/16

## 2016-02-23 ENCOUNTER — Encounter (HOSPITAL_COMMUNITY): Payer: Self-pay | Admitting: Orthopedic Surgery

## 2016-02-23 DIAGNOSIS — I9581 Postprocedural hypotension: Secondary | ICD-10-CM

## 2016-02-23 DIAGNOSIS — I5020 Unspecified systolic (congestive) heart failure: Secondary | ICD-10-CM

## 2016-02-23 DIAGNOSIS — R739 Hyperglycemia, unspecified: Secondary | ICD-10-CM

## 2016-02-23 DIAGNOSIS — I5021 Acute systolic (congestive) heart failure: Secondary | ICD-10-CM

## 2016-02-23 LAB — RENAL FUNCTION PANEL
ALBUMIN: 3 g/dL — AB (ref 3.5–5.0)
Anion gap: 9 (ref 5–15)
BUN: 14 mg/dL (ref 6–20)
CALCIUM: 8 mg/dL — AB (ref 8.9–10.3)
CHLORIDE: 108 mmol/L (ref 101–111)
CO2: 23 mmol/L (ref 22–32)
CREATININE: 1.19 mg/dL (ref 0.61–1.24)
Glucose, Bld: 127 mg/dL — ABNORMAL HIGH (ref 65–99)
PHOSPHORUS: 3.4 mg/dL (ref 2.5–4.6)
Potassium: 3.7 mmol/L (ref 3.5–5.1)
SODIUM: 140 mmol/L (ref 135–145)

## 2016-02-23 LAB — GLUCOSE, CAPILLARY
GLUCOSE-CAPILLARY: 130 mg/dL — AB (ref 65–99)
GLUCOSE-CAPILLARY: 118 mg/dL — AB (ref 65–99)
Glucose-Capillary: 128 mg/dL — ABNORMAL HIGH (ref 65–99)
Glucose-Capillary: 118 mg/dL — ABNORMAL HIGH (ref 65–99)
Glucose-Capillary: 119 mg/dL — ABNORMAL HIGH (ref 65–99)

## 2016-02-23 LAB — BLOOD GAS, ARTERIAL
Acid-Base Excess: 0.2 mmol/L (ref 0.0–2.0)
BICARBONATE: 24.5 meq/L — AB (ref 20.0–24.0)
FIO2: 32
O2 Content: 3 L/min
O2 Saturation: 98.8 %
Patient temperature: 98.2
TCO2: 25.8 mmol/L (ref 0–100)
pCO2 arterial: 40.6 mmHg (ref 35.0–45.0)
pH, Arterial: 7.397 (ref 7.350–7.450)
pO2, Arterial: 121 mmHg — ABNORMAL HIGH (ref 80.0–100.0)

## 2016-02-23 LAB — CBC WITH DIFFERENTIAL/PLATELET
Basophils Absolute: 0 10*3/uL (ref 0.0–0.1)
Basophils Relative: 0 %
EOS ABS: 0.1 10*3/uL (ref 0.0–0.7)
Eosinophils Relative: 1 %
HEMATOCRIT: 43.1 % (ref 39.0–52.0)
HEMOGLOBIN: 14.4 g/dL (ref 13.0–17.0)
LYMPHS ABS: 0.4 10*3/uL — AB (ref 0.7–4.0)
LYMPHS PCT: 5 %
MCH: 29.8 pg (ref 26.0–34.0)
MCHC: 33.4 g/dL (ref 30.0–36.0)
MCV: 89 fL (ref 78.0–100.0)
MONOS PCT: 8 %
Monocytes Absolute: 0.7 10*3/uL (ref 0.1–1.0)
NEUTROS ABS: 8 10*3/uL — AB (ref 1.7–7.7)
NEUTROS PCT: 87 %
Platelets: 132 10*3/uL — ABNORMAL LOW (ref 150–400)
RBC: 4.84 MIL/uL (ref 4.22–5.81)
RDW: 14.1 % (ref 11.5–15.5)
WBC: 9.1 10*3/uL (ref 4.0–10.5)

## 2016-02-23 LAB — MAGNESIUM: MAGNESIUM: 2.1 mg/dL (ref 1.7–2.4)

## 2016-02-23 LAB — TROPONIN I: Troponin I: 1.13 ng/mL (ref <0.03)

## 2016-02-23 LAB — TRIGLYCERIDES: Triglycerides: 111 mg/dL (ref <150)

## 2016-02-23 MED ORDER — LACTULOSE 10 GM/15ML PO SOLN
20.0000 g | Freq: Two times a day (BID) | ORAL | Status: DC
Start: 2016-02-23 — End: 2016-02-25
  Administered 2016-02-23: 20 g via ORAL
  Filled 2016-02-23 (×4): qty 30

## 2016-02-23 MED ORDER — INSULIN ASPART 100 UNIT/ML ~~LOC~~ SOLN
2.0000 [IU] | SUBCUTANEOUS | Status: DC
  Administered 2016-02-23 (×2): 2 [IU] via SUBCUTANEOUS
  Administered 2016-02-23: 4 [IU] via SUBCUTANEOUS

## 2016-02-23 MED ORDER — INSULIN ASPART 100 UNIT/ML ~~LOC~~ SOLN: 0.0000 [IU] | Freq: Three times a day (TID) | SUBCUTANEOUS | Status: DC

## 2016-02-23 MED ORDER — SODIUM CHLORIDE 0.9% FLUSH
10.0000 mL | INTRAVENOUS | Status: DC | PRN
  Administered 2016-02-24 (×2): 10 mL
  Filled 2016-02-23 (×2): qty 40

## 2016-02-23 MED ORDER — INSULIN ASPART 100 UNIT/ML ~~LOC~~ SOLN: 0.0000 [IU] | Freq: Every day | SUBCUTANEOUS | Status: DC

## 2016-02-23 MED ORDER — HYDROCODONE-ACETAMINOPHEN 5-325 MG PO TABS
1.0000 | ORAL_TABLET | ORAL | Status: DC | PRN
  Administered 2016-02-23: 1 via ORAL
  Filled 2016-02-23: qty 1

## 2016-02-23 MED ORDER — SODIUM CHLORIDE 0.9% FLUSH
3.0000 mL | Freq: Two times a day (BID) | INTRAVENOUS | Status: DC
Start: 2016-02-23 — End: 2016-02-23

## 2016-02-23 MED ORDER — SODIUM CHLORIDE 0.9% FLUSH: 10.0000 mL | Freq: Two times a day (BID) | INTRAVENOUS | Status: DC

## 2016-02-23 MED ORDER — SODIUM CHLORIDE 0.9 % IV SOLN: INTRAVENOUS | Status: DC

## 2016-02-23 MED ORDER — CARVEDILOL 3.125 MG PO TABS
3.1250 mg | ORAL_TABLET | Freq: Two times a day (BID) | ORAL | Status: DC
  Administered 2016-02-23: 3.125 mg via ORAL
  Filled 2016-02-23 (×3): qty 1

## 2016-02-23 MED ORDER — PANTOPRAZOLE SODIUM 40 MG PO TBEC
40.0000 mg | DELAYED_RELEASE_TABLET | Freq: Every day | ORAL | Status: DC
  Administered 2016-02-23: 40 mg via ORAL
  Filled 2016-02-23 (×2): qty 1

## 2016-02-23 MED ORDER — SODIUM CHLORIDE 0.9% FLUSH: 3.0000 mL | INTRAVENOUS | Status: DC | PRN

## 2016-02-23 MED ORDER — SODIUM CHLORIDE 0.9 % IV SOLN: 250.0000 mL | INTRAVENOUS | Status: DC | PRN

## 2016-02-23 NOTE — Op Note (Signed)
Stephen Burch, Stephen Burch                ACCOUNT NO.:  [id number]  MEDICAL RECORD NO.:  [id number]  LOCATION:  2S02C                        FACILITY:  [facility]  PHYSICIAN:  Estill Bamberg. Ronnie Derby, M.D. DATE OF BIRTH:  06/09/1956  DATE OF PROCEDURE:  02/22/2016 DATE OF DISCHARGE:                              OPERATIVE REPORT   SURGEON:  Estill Bamberg. Ronnie Derby, M.D.  ASSISTANT:  Carlyon Shadow, PA-C  ANESTHESIA:  General.  PREOPERATIVE DIAGNOSIS:  Right draining baker cyst.  POSTOPERATIVE DIAGNOSIS:  Right draining baker cyst.  PROCEDURE:  Right open irrigation and debridement with VAC sponge placement.  INDICATION FOR PROCEDURE:  The patient is a 60 year old, white male, with draining Baker cyst many years status post a knee replacement done by a Psychologist, sport and exercise in another community.  An open cyst debridement was taken place couple years ago by myself after multiple aspirations of the Baker cyst.  Informed consent was obtained.  DESCRIPTION OF PROCEDURE:  The patient was laid in the prone position, well-padded extremity, and prepped and draped in the usual fashion.  I then used #10 blade to ellipse the draining sinus tract.  I then irrigated and debrided about 500 mL through the wound.  About 3 minutes into the operation, anesthesia staff said that he was having difficulty "moving air."  They called for the anesthesia Staff.  Within 3 minutes, I finished the surgery, had the VAC sponge in place, and had offered to put the patient back to the supine position, which we did.  In the subsequent 1-2 minutes, they begin to call for code him begin chest compressions and running the code.  Ultimately, after he was in the ED unit, he seemed stabilized and he was taken to the ICU by the anesthesia staff.  There was no communication with regard to what they felt the source of the problem was and felt that the critical care team could figure that out.  ESTIMATED BLOOD LOSS:  Minimal.  COMPLICATIONS:  As  stated.         ______________________________ Estill Bamberg. Ronnie Derby, M.D.    SDL/MEDQ  D:  02/22/2016  T:  02/23/2016  Job:  [id number]

## 2016-02-23 NOTE — Progress Notes (Signed)
SPORTS MEDICINE AND JOINT REPLACEMENT  Lara Mulch, MD    Carlyon Shadow, PA-C [place], [place], [place]  [address]                             [phone]   PROGRESS NOTE  Subjective:  positive for Chest Pain  positive for Shortness of Breath  negative for Nausea/Vomiting   negative for Calf Pain  negative for Bowel Movement   Tolerating Diet: yes         Patient reports pain as 5 on 0-10 scale.    Objective: Vital signs in last 24 hours:   Patient Vitals for the past 24 hrs:  BP Temp Temp src Pulse Resp SpO2 Height Weight  02/23/16 1550 - - - - - - - 102.7 kg (226 lb 6.6 oz)  02/23/16 1528 - 98.4 F (36.9 C) Oral - - - - -  02/23/16 1400 103/67 - - 67 (!) 23 96 % - -  02/23/16 1300 - - - 72 12 95 % - -  02/23/16 1208 - 98.2 F (36.8 C) Oral - - - - -  02/23/16 1200 115/66 - - 89 20 94 % - -  02/23/16 1100 (!) 91/57 - - 84 14 96 % - -  02/23/16 1000 109/64 - - 88 20 96 % - -  02/23/16 0900 (!) 87/53 - - 83 20 95 % - -  02/23/16 0800 92/64 - - 75 (!) 21 94 % - -  02/23/16 0719 - 99 F (37.2 C) Oral - - - - -  02/23/16 0700 (!) 89/56 - - 72 (!) 21 94 % - -  02/23/16 0645 - - - 70 16 96 % - -  02/23/16 0630 (!) 93/58 - - 79 (!) 23 95 % - -  02/23/16 0615 - - - 81 14 96 % - -  02/23/16 0600 (!) 92/59 - - 75 (!) 21 98 % - -  02/23/16 0545 - - - 69 (!) 21 98 % - -  02/23/16 0530 95/60 - - 77 (!) 22 98 % - -  02/23/16 0515 - - - 76 16 99 % - -  02/23/16 0500 96/64 - - 69 (!) 21 98 % - -  02/23/16 0445 - - - 70 20 96 % - -  02/23/16 0430 (!) 91/57 - - 89 (!) 23 98 % - -  02/23/16 0415 - - - 71 (!) 22 98 % - -  02/23/16 0400 99/60 98.1 F (36.7 C) Oral 75 (!) 23 97 % 6' (1.829 m) 102.7 kg (226 lb 6.6 oz)  02/23/16 0345 - - - 76 (!) 21 98 % - -  02/23/16 0330 96/64 - - 77 (!) 23 97 % - -  02/23/16 0315 - - - 81 (!) 23 98 % - -  02/23/16 0300 104/70 - - 77 20 98 % - -  02/23/16 0245 - - - 75 (!) 22 98 % - -  02/23/16 0230 (!) 93/59 - - 76 (!) 23 98 % - -   02/23/16 0215 96/67 - - 75 16 100 % - -  02/23/16 0200 (!) 98/56 - - 75 (!) 24 97 % - -  02/23/16 0145 (!) 89/70 - - 78 (!) 23 97 % - -  02/23/16 0130 105/73 - - 81 (!) 22 97 % - -  02/23/16 0115 93/66 - - 73 (!)  21 98 % - -  02/23/16 0100 93/65 - - 75 20 100 % - -  02/23/16 0045 96/67 - - 73 (!) 21 98 % - -  02/23/16 0030 (!) 89/62 - - 77 16 99 % - -  02/23/16 0015 (!) 84/64 - - 74 20 98 % - -  02/23/16 0003 - 98.1 F (36.7 C) Oral - - - - -  02/23/16 0000 (!) 88/54 - - 77 (!) 21 97 % - -  02/22/16 2345 96/63 - - 79 (!) 21 97 % - -  02/22/16 2330 (!) 86/60 - - 82 13 99 % - -  02/22/16 2315 96/67 - - 79 (!) 22 98 % - -  02/22/16 2300 (!) 109/93 - - 79 20 97 % - -  02/22/16 2245 91/60 - - 81 19 97 % - -  02/22/16 2230 100/64 - - 79 14 98 % - -  02/22/16 2215 108/68 - - 80 15 99 % - -  02/22/16 2200 108/82 - - 81 14 98 % - -  02/22/16 2145 108/69 - - 80 18 99 % - -  02/22/16 2130 100/71 - - 79 14 99 % - -  02/22/16 2115 107/72 - - 76 17 99 % - -  02/22/16 2100 94/65 - - 85 15 99 % - -  02/22/16 2045 (!) 158/104 - - (!) 123 (!) 22 96 % - -  02/22/16 2030 98/70 - - 84 19 97 % - -  02/22/16 2015 96/71 - - 83 19 98 % - -  02/22/16 2000 101/72 - - 87 19 98 % - -  02/22/16 1935 - 99.1 F (37.3 C) Oral - - - - -  02/22/16 1900 (!) 89/66 - - 86 18 98 % - -  02/22/16 1800 97/74 - - 90 18 99 % - -  02/22/16 1730 97/75 - - 92 18 99 % - -  02/22/16 1700 92/73 - - 90 18 99 % 6' (1.829 m) 106.3 kg (234 lb 5.6 oz)  02/22/16 1631 (!) 79/59 - - 94 18 99 % - -  02/22/16 1630 - - - 95 18 97 % - -    @flow {1959:LAST@   Intake/Output from previous day:   08/23 0701 - 08/24 0700 In: 3961.8 [I.V.:2675.8] Out: 3105 [Urine:2885; Drains:50]   Intake/Output this shift:   08/24 0701 - 08/24 1900 In: 597.5 [P.O.:240; I.V.:357.5] Out: 2125 [Urine:2125]   Intake/Output      08/23 0701 - 08/24 0700 08/24 0701 - 08/25 0700   P.O.  240   I.V. (mL/kg) 2675.8 (26.1) 357.5 (3.5)   NG/GT 30    IV  Piggyback 1256    Total Intake(mL/kg) 3961.8 (38.6) 597.5 (5.8)   Urine (mL/kg/hr) 2885 2125 (2.3)   Emesis/NG output 150    Drains 50 0 (0)   Blood 20    Total Output 3105 2125   Net +856.8 -1527.5           LABORATORY DATA:  Recent Labs  02/21/16 1428 02/22/16 1250 02/22/16 1430 02/23/16 0230  WBC 4.3 4.0 1.7* 9.1  HGB 15.5 16.1 15.8 14.4  HCT 45.7 47.5 46.6 43.1  PLT 85* 83* 114* 132*    Recent Labs  02/21/16 1428 02/22/16 1430 02/23/16 0924  NA 139 138 140  K 3.8 3.5 3.7  CL 108 108 108  CO2 24 21* 23  BUN 11 14 14   CREATININE 0.95 1.26* 1.19  GLUCOSE 126*  137* 127*  CALCIUM 8.7* 8.0* 8.0*   Lab Results  Component Value Date   INR 1.21 02/22/2016   INR 1.00 05/13/2013   INR 1.00 09/12/2012    Examination:  General appearance: alert, cooperative and mild distress Extremities: extremities normal, atraumatic, no cyanosis or edema  Wound Exam: clean, dry, intact   Drainage:  None: wound tissue dry  Motor Exam: Quadriceps and Hamstrings Intact  Sensory Exam: Superficial Peroneal, Deep Peroneal and Tibial normal   Assessment:    1 Day Post-Op  Procedure(s) (LRB): IRRIGATION AND DEBRIDEMENT KNEE (Right) APPLICATION OF WOUND VAC (Right)  ADDITIONAL DIAGNOSIS:  Active Problems:   Cardiac arrest (HCC)   Encounter for central line placement   Shock circulatory (HCC)  Acute Blood Loss Anemia   Plan: Physical Therapy as ordered Weight Bearing as Tolerated (WBAT)  DVT Prophylaxis:  None  Will continue to follow patient daily s/p bakers cyst removal. Patient schedule for cardiac cath tomorrow due to elevated cardiac enzymes. Will continue to check daily until D/C.          Donia Ast 02/23/2016, 4:11 PM

## 2016-02-23 NOTE — Consult Note (Signed)
CARDIOLOGY CONSULT NOTE   Patient ID: Stephen Burch MRN: [id number] DOB/AGE: [birth year] 60 y.o.  Admit date: 02/22/2016  Primary Physician   Mila Merry, MD Primary Cardiologist   New Reason for Consultation   Abnormal echo, decreased EF Requesting MD: Dr Jamison Neighbor  NWG:[id number] Stephen Burch is a 60 y.o. year old male with a history of arthritis s/p total knee replacement, GERD, inguinal hiatal hernia repair, renal stones, cirrhosis, pneumonia and asthma (not defined) who presented to [facility] on 8/23 for planned Bakers Cyst removal.    After the case, he went into cardiac arrest. Details recorded in surgical log below describe ST elevation on the monitor, difficulty with ventilation and hypotension, then he became pulseless. He was prone at the time and was turned over, CPR started and he was given Epi x 1. After that, he went into Vfib and was shocked twice. He was kept intubated after the procedure, but self-extubated overnight.   On 08/24, he had some mild chest discomfort and an echo showed an EF of 30-35%. He had a mild elevation in his troponin. Cardiology was asked to evaluate him.  Prior to admission, he was as active as his knee would allow. He was walking daily, working part-time and was able to lift 50 lbs as needed with no difficulty -no CP or dyspnea    Past Medical History:  Diagnosis Date  . Arthritis    knees  . Asthma    as a kid  . Cirrhosis (HCC)   . GERD (gastroesophageal reflux disease)   . History of hiatal hernia   . Kidney stones   . Pneumonia    3 years ago     Past Surgical History:  Procedure Laterality Date  . APPLICATION OF WOUND VAC Right 02/22/2016   Procedure: APPLICATION OF WOUND VAC;  Surgeon: Dannielle Huh, MD;  Location: [facility] OR;  Service: Orthopedics;  Laterality: Right;  . COLONOSCOPY    . CT SCAN of Abdomen  06/27/2012   Cirrhosis with portal hypertension. Marked slenomegaly. Bilateral inguinal hernias. Right inguinal hernia contains part of the  bladder. Report from [facility]  . EAR CYST EXCISION Right 09/06/2014   Procedure: OPEN EXCISION BAKER'S CYST RIGHT KNEE;  Surgeon: Dannielle Huh, MD;  Location: [facility] OR;  Service: Orthopedics;  Laterality: Right;  . ESOPHAGEAL DILATION  1996  . ESOPHAGOGASTRODUODENOSCOPY N/A 09/17/2012   Procedure: ESOPHAGOGASTRODUODENOSCOPY (EGD);  Surgeon: Malissa Hippo, MD;  Location: [facility] ENDO SUITE;  Service: Endoscopy;  Laterality: N/A;  200  . ESOPHAGOGASTRODUODENOSCOPY N/A 12/15/2015   Procedure: ESOPHAGOGASTRODUODENOSCOPY (EGD);  Surgeon: Malissa Hippo, MD;  Location: [facility] ENDO SUITE;  Service: Endoscopy;  Laterality: N/A;  1245  . Exercise tredmill test  03/27/2005   Normal  . HERNIA REPAIR Bilateral 05/22/2013, 11/1997   x 2 (Inguinal hernia) Dr. Derrell Lolling, Dr. Achilles Dunk  . INGUINAL HERNIA REPAIR Bilateral 05/22/2013   Procedure: REPAIR OF RECURRENT INCARCERATED INGUINAL HERNIA WITH MESH RIGHT SIDE,  REPAIR OF RECURRENT INGUINAL HERNIA WITH MESH LEFT SIDE;  Surgeon: Ernestene Mention, MD;  Location: [facility] OR;  Service: General;  Laterality: Bilateral;  . INSERTION OF MESH Bilateral 05/22/2013   Procedure: INSERTION OF MESH;  Surgeon: Ernestene Mention, MD;  Location: [facility] OR;  Service: General;  Laterality: Bilateral;  . IRRIGATION AND DEBRIDEMENT KNEE Right 02/22/2016   Procedure: IRRIGATION AND DEBRIDEMENT KNEE;  Surgeon: Dannielle Huh, MD;  Location: [facility] OR;  Service: Orthopedics;  Laterality: Right;  . Irrigation and  Debridement right knee  03/23/2009   Dr. Hyacinth Meeker, [facility]  . JOINT REPLACEMENT  03/16/2009   RIGHT KNEE REPLACEMENT  . KNEE ARTHROSCOPY WITH EXCISION BAKER'S CYST Right 10/01/14  . Radionuclide Imaging Study     Normal LV systolic function with ejection of 50%; Normal myocardial perfusion without evidence of myocardial ischemia  . REPLACEMENT TOTAL KNEE     Bilateral knee replacement, 2010, 2012  . TONSILLECTOMY      Allergies  Allergen Reactions  . Aspirin Shortness Of Breath  . Penicillins  Shortness Of Breath and Rash    Has patient had a PCN reaction causing immediate rash, facial/tongue/throat swelling, SOB or lightheadedness with hypotension: Yes Has patient had a PCN reaction causing severe rash involving mucus membranes or skin necrosis: No Has patient had a PCN reaction that required hospitalization No Has patient had a PCN reaction occurring within the last 10 years: No If all of the above answers are "NO", then may proceed with Cephalosporin use.   . Vancomycin Anaphylaxis    Immediately following Vancomycin administration in the OR patient Vfib arrested.    I have reviewed the patient's current medications . antiseptic oral rinse  7 mL Mouth Rinse BID  . heparin  5,000 Units Subcutaneous Q8H  . insulin aspart  2-6 Units Subcutaneous Q4H  . lactulose  20 g Oral BID  . pantoprazole  40 mg Oral Daily  . sodium chloride flush  10-40 mL Intracatheter Q12H   . lactated ringers 50 mL/hr at 02/23/16 1400  . phenylephrine (NEO-SYNEPHRINE) Adult infusion Stopped (02/23/16 0745)   sodium chloride, fentaNYL (SUBLIMAZE) injection, sodium chloride flush  Prior to Admission medications   Medication Sig Start Date End Date Taking? Authorizing Provider  pantoprazole (PROTONIX) 40 MG tablet Take 1 tablet (40 mg total) by mouth daily. 12/13/15  Yes Malissa Hippo, MD  ciprofloxacin (CIPRO) 500 MG tablet Take 1 tablet (500 mg total) by mouth 2 (two) times daily. Patient not taking: Reported on 02/20/2016 02/05/16   Burgess Amor, PA-C  HYDROcodone-acetaminophen (NORCO/VICODIN) 5-325 MG tablet Take 1 tablet by mouth every 4 (four) hours as needed. Patient not taking: Reported on 02/20/2016 02/05/16   Burgess Amor, PA-C  lactulose (CHRONULAC) 10 GM/15ML solution Take 30 mLs (20 g total) by mouth 2 (two) times daily. Patient not taking: Reported on 02/20/2016 12/15/15   Malissa Hippo, MD     Social History   Social History  . Marital status: Single    Spouse name: N/A  . Number of  children: 2  . Years of education: N/A   Occupational History  . Tour manager   Social History Main Topics  . Smoking status: Former Smoker    Packs/day: 0.25    Years: 3.00    Types: Cigarettes    Quit date: 07/03/1975  . Smokeless tobacco: Never Used     Comment: none since age 73-1 pack per week when he was smoking   . Alcohol use No  . Drug use: No  . Sexual activity: Not on file   Other Topics Concern  . Not on file   Social History Narrative   Pt lives alone.    Family Status  Relation Status  . Mother Alive   good health  . Father Deceased   Cirrhosis of liver, non-alcoholic  . Sister Alive   good health  . Brother Alive   DM2  . Paternal Uncle Deceased   complication of Diabetes  . Maternal Grandmother  Deceased   died in her 56's from MI  . Maternal Grandfather Deceased   age of death: in his 73's. cause of death: MI  . Neg Hx    Family History  Problem Relation Age of Onset  . Diabetes Mother     type 2, born 73  . Arthritis Father     died age 25  . GI Bleed Father     upper GI Bleed, non-ETOH cirrhosis  . Arthritis Brother   . Diabetes Brother     type 2  . Diabetes Paternal Uncle     type 2  . Heart attack Maternal Grandmother   . Heart attack Maternal Grandfather   . Colon cancer Neg Hx   . Colon polyps Neg Hx      ROS:  Full 14 point review of systems complete and found to be negative unless listed above.  Physical Exam: Blood pressure 103/67, pulse 67, temperature 98.2 F (36.8 C), temperature source Oral, resp. rate (!) 23, height 6' (1.829 m), weight 226 lb 6.6 oz (102.7 kg), SpO2 96 %.  General: Well developed, well nourished, male in no acute distress Head: Eyes PERRLA, No xanthomas.   Normocephalic and atraumatic, oropharynx without edema or exudate. Dentition: good Lungs: clear bilaterally Heart: HRRR S1 S2, no rub/gallop, no murmur. pulses are 2+ all 4 extrem.   Neck: No carotid bruits. No lymphadenopathy.  JVD  not elevated Abdomen: Bowel sounds present, abdomen soft and non-tender without masses or hernias noted. Msk:  No spine or cva tenderness. No weakness, no joint deformities or effusions. Extremities: No clubbing or cyanosis. No edema. Knee wound with wound vac in place, not disturbed Neuro: Alert and oriented X 3. No focal deficits noted. Psych:  Good affect, responds appropriately Skin: No rashes or lesions noted.  Labs:   Lab Results  Component Value Date   WBC 9.1 02/23/2016   HGB 14.4 02/23/2016   HCT 43.1 02/23/2016   MCV 89.0 02/23/2016   PLT 132 (L) 02/23/2016    Recent Labs  02/22/16 1430  INR 1.21     Recent Labs Lab 02/22/16 1430 02/23/16 0924  NA 138 140  K 3.5 3.7  CL 108 108  CO2 21* 23  BUN 14 14  CREATININE 1.26* 1.19  CALCIUM 8.0* 8.0*  PROT 5.6*  --   BILITOT 1.5*  --   ALKPHOS 116  --   ALT 138*  --   AST 169*  --   GLUCOSE 137* 127*  ALBUMIN 3.5 3.0*   Magnesium  Date Value Ref Range Status  02/23/2016 2.1 1.7 - 2.4 mg/dL Final    Recent Labs  62/95/28 1250 02/22/16 1430 02/22/16 1940 02/23/16 0230  TROPONINI <0.03 0.38* 1.85* 1.13*   B Natriuretic Peptide  Date/Time Value Ref Range Status  02/22/2016 02:02 PM 32.2 0.0 - 100.0 pg/mL Final   Lab Results  Component Value Date   TRIG 111 02/23/2016   Echo: 08/24 - Left ventricle: The cavity size was normal. Wall thickness was   normal. Systolic function was moderately to severely reduced. The   estimated ejection fraction was in the range of 30% to 35%.   Severe hypokinesis of the mid-apicalanteroseptal and apical   myocardium. Doppler parameters are consistent with abnormal left   ventricular relaxation (grade 1 diastolic dysfunction).  ECG:  08/23 ST, rate 115 QT/QTc 340/470 ms  Anesthesia Report post-op: 8/24 1223 Quick Note     EKG changes noted on monitor. ST elevation.  Peak pressure elevated and difficulty delivering adequate tidal volume. Tube checked for kinks.  Breath sounds present bilateral, diminished, wheezes on left. Requested Dr. Desmond Lope be called to the room.    1226 Quick Note    MDA called to room for hypotension.   1226 Anesthesia MD in room    MDA called to room for hypotension unresponsive to ephedrine. On arrival to OR, patient in prone position, hypotensive with EtCO2 in teens. Bilateral breath sounds to auscultation, but breath sounds diminished. Communicated with surgeon to return patient to supine position. of epinephrine IV administered. Vancomycin infusion discontinued. Patient returned to supine position. Pulse checked and patient found to be pulseless. 1mg  epinephrine delivered IV and CPR initiated. 2nd IV and arterial line obtained. Patient progressed into V-fib and defibrillated x1 with return of spontaneous circulation and sinus rhythm.   Transported to ICU intubated, sedated and VSS. Report given to ICU staff.   Rondall Allegra, MD   [phone] Quick Note    CPR in progress   1231 CPR   1232 CPR   1235 Quick Note    Defib shock 120 joules    1235 Defibrillation    Defib shock 120 Joules    1236 Restoration of Circulation    Sinus Huston Foley with a pulse      Radiology:  Dg Chest Port 1 View Result Date: 02/22/2016 CLINICAL DATA:  Status post central line placement and intubation today. EXAM: PORTABLE CHEST 1 VIEW COMPARISON:  PA and lateral chest 07/29/2012. FINDINGS: Endotracheal tube is in place with the tip at the level of clavicular heads well above the carina. Right subclavian catheter tip projects over the mid to lower superior vena cava. Defibrillator pad is in place over the left chest. Lung volumes are low but the lungs are clear. No pneumothorax or pleural fluid. Heart size is normal. IMPRESSION: ETT and right subclavian catheter projecting good position. Negative for pneumothorax. No acute disease. Electronically Signed   By: Drusilla Kanner M.D.   On: 02/22/2016 16:40    ASSESSMENT AND PLAN:   The patient was seen  today by Dr Elease Hashimoto, the patient evaluated and the data reviewed.  Active Problems:   Cardiac arrest (HCC) - with low EF, cath am - unclear if ischemic or non-ischemic - hold off on statin w/ abnl LFTs, BP has been low, but may be able to add low-dose Coreg and/or ACE prior to dc - no ASA, it gives him asthma - The risks and benefits of a cardiac catheterization including, but not limited to, death, stroke, MI, kidney damage and bleeding were discussed with the patient who indicates understanding and agrees to proceed.     Encounter for central line placement - per CCM    Shock circulatory (HCC) - was on neo all night, now off.  SignedLeanna Battles 02/23/2016 2:59 PM Beeper [phone]  Co-Sign MD   Attending Note:   The patient was seen and examined.  Agree with assessment and plan as noted above.  Changes made to the above note as needed.  Patient seen and independently examined with Theodore Demark, PA .   We discussed all aspects of the encounter. I agree with the assessment and plan as stated above.  Pt presented for Bakers cyst surgery . Surgery was complicated by respiratory arrest / cardiac arrest Apparently had some ST elevation during surgery  - no ECG or tele strips are available  Had CPR, received Epi - then at that point had VF  arrest Was recessutated  Echo now reveals EF of 35%  Was hypotensive this am - required pressors this am  Doing better.   BP is better  1. Cardiopulmonary arrest - cause is unclear. Has been found to have EF 35%  Will arrange for cath tomorrow Has an ASA allergy.   Will wait and start plavix once we see that the will not need CABG   2. Acute vs. Chronic systolic chf:   Will start low dose coreg  . BP is still marginal today  Anticipate adding ARB soon He will need follow up for this CHF as op    I have spent a total of 40 minutes with patient reviewing hospital  notes , telemetry, EKGs, labs and examining patient as well as  establishing an assessment and plan that was discussed with the patient. > 50% of time was spent in direct patient care.    Vesta Mixer, Montez Hageman., MD, [facility] 02/23/2016, 3:36 PM [address],  [address] Office [phone] Pager [phone]

## 2016-02-23 NOTE — Progress Notes (Signed)
Stephen Burch w [facility] called to let us know that they had seen pt prior to adm and will follow after disch.

## 2016-02-23 NOTE — Progress Notes (Signed)
eLink Physician-Brief Progress Note Patient Name: Stephen Burch DOB: [birth year] MRN: [id number]   Date of Service  02/23/2016  HPI/Events of Note  Passed bedside swallow, elevated BGs  eICU Interventions  Start clears SSI     Intervention Category Intermediate Interventions: Other:  Hermila Millis 02/23/2016, 12:00 AM

## 2016-02-23 NOTE — Progress Notes (Signed)
PULMONARY / CRITICAL CARE MEDICINE   Name: Stephen Burch MRN: [id number] DOB: 08/12/1955    ADMISSION DATE:  02/22/2016 CONSULTATION DATE:  02/22/16  REFERRING MD:  Dr. Sherlean Foot  CHIEF COMPLAINT:  VF Arrest   HISTORY OF PRESENT ILLNESS:   60 y/o M with PMH of arthritis s/p total knee replacement, GERD, inguinal hiatal hernia repair, renal stones, cirrhosis, pneumonia and asthma (not defined) who presented to [facility] on 8/23 for planned Bakers Cyst removal.    Per chart review, the patient has been followed by [facility] for right leg pain that began several months before admission with a gradual worsening.  He had been treated with NSAIDs and activity modification without relief.    The patient was admitted 8/23 for planned removal of Bakers cyst.  He was intubated per anesthesia (grade I view with a MAC 4) without noted difficulty.  Per report, during the case he developed a VF arrest requiring shock x1.  No other details available at this time.  PCCM called for assistance.    SUBJECTIVE:  Patient self extubated overnight and passed bedside swallow evaluation. Patient reports some chest wall discomfort that is mild. Denies any dyspnea or cough. No nausea or emesis.   REVIEW OF SYSTEMS:  No fever or chills. No headache or vision changes.   VITAL SIGNS: BP 92/64 (BP Location: Right Arm)   Pulse 75   Temp 99 F (37.2 C) (Oral)   Resp (!) 21   Ht 6' (1.829 m)   Wt 226 lb 6.6 oz (102.7 kg)   SpO2 94%   BMI 30.71 kg/m   HEMODYNAMICS: CVP:  [3 mmHg-15 mmHg] 6 mmHg  VENTILATOR SETTINGS: Vent Mode: PRVC FiO2 (%):  [30 %-100 %] 30 % Set Rate:  [16 bmp-18 bmp] 18 bmp Vt Set:  [620 mL] 620 mL PEEP:  [5 cmH20] 5 cmH20 Plateau Pressure:  [13 cmH20-21 cmH20] 17 cmH20  INTAKE / OUTPUT: I/O last 3 completed shifts: In: 3961.8 [I.V.:2675.8; NG/GT:30; IV Piggyback:1256] Out: 3105 [Urine:2885; Emesis/NG output:150; Drains:50; Blood:20]  PHYSICAL EXAMINATION: General:  Awake. Alert. Daughters now  at bedside.  Integument:  Warm & dry. No rash on exposed skin. Right posterior knee dressing with wound VAC in place. Lymphatics:  No appreciated cervical or supraclavicular lymphadenoapthy. HEENT:  Moist mucus membranes. No scleral injection or icterus.  Cardiovascular:  Regular rate and rhythm. No edema. Normal S1 & S2. Pulmonary:  Good aeration & clear to auscultation bilaterally. Normal work of breathing on room air. Abdomen: Soft. Normal bowel sounds. Protuberant. Nontender. Musculoskeletal:  Normal bulk and tone. No joint deformity or effusion appreciated. Neurological:  Cranial nerves grossly intact. Moving all 4 extremities equally. Grossly nonfocal. Psychiatric:  Mood and affect congruent. Alert and oriented 4.  LABS:  BMET  Recent Labs Lab 02/21/16 1428 02/22/16 1430  NA 139 138  K 3.8 3.5  CL 108 108  CO2 24 21*  BUN 11 14  CREATININE 0.95 1.26*  GLUCOSE 126* 137*    Electrolytes  Recent Labs Lab 02/21/16 1428 02/22/16 1430  CALCIUM 8.7* 8.0*  MG  --  1.6*  PHOS  --  4.7*    CBC  Recent Labs Lab 02/22/16 1250 02/22/16 1430 02/23/16 0230  WBC 4.0 1.7* 9.1  HGB 16.1 15.8 14.4  HCT 47.5 46.6 43.1  PLT 83* 114* 132*    Coag's  Recent Labs Lab 02/22/16 1430  APTT 29  INR 1.21    Sepsis Markers  Recent Labs Lab 02/22/16 1430  LATICACIDVEN 2.5*    ABG  Recent Labs Lab 02/22/16 1705 02/22/16 2134 02/23/16 0420  PHART 7.352 7.341* 7.397  PCO2ART 40.5 42.1 40.6  PO2ART 152.0* 413.0* 121*    Liver Enzymes  Recent Labs Lab 02/21/16 1428 02/22/16 1430  AST 32 169*  ALT 35 138*  ALKPHOS 109 116  BILITOT 1.3* 1.5*  ALBUMIN 4.2 3.5    Cardiac Enzymes  Recent Labs Lab 02/22/16 1430 02/22/16 1940 02/23/16 0230  TROPONINI 0.38* 1.85* 1.13*    Glucose  Recent Labs Lab 02/22/16 1359 02/22/16 1929 02/22/16 2357 02/23/16 0405 02/23/16 0713  GLUCAP 143* 153* 166* 128* 118*    Imaging Dg Chest Port 1  View  Result Date: 02/22/2016 CLINICAL DATA:  Status post central line placement and intubation today. EXAM: PORTABLE CHEST 1 VIEW COMPARISON:  PA and lateral chest 07/29/2012. FINDINGS: Endotracheal tube is in place with the tip at the level of clavicular heads well above the carina. Right subclavian catheter tip projects over the mid to lower superior vena cava. Defibrillator pad is in place over the left chest. Lung volumes are low but the lungs are clear. No pneumothorax or pleural fluid. Heart size is normal. IMPRESSION: ETT and right subclavian catheter projecting good position. Negative for pneumothorax. No acute disease. Electronically Signed   By: Drusilla Kanner M.D.   On: 02/22/2016 16:40     STUDIES:  EKG 8/23:  Sinus Tach w/ QTc . Questionable Q-waves in III & AVF. No obvious ischemia. Port CXR 8/23: Endotracheal tube in good position. Right subclavian central venous catheter in good position. Low lung volumes. No focal opacity. EKG 8/23:  QTc . Sinus tachycardia. No obvious ischemia.  TTE 8/23: EF 30-35% with severe hypokinesis. Wall motion abnormalities. Grade 1 diastolic dysfunction. LA & RA normal in size. RV normal in size and function. No aortic stenosis or regurgitation. Aortic root normal in size. No mitral stenosis or regurgitation. Trivial pulmonic regurgitation. Trivial tricuspid regurgitation. No pericardial effusion.  MICROBIOLOGY: MRSA PCR 8/23 >>  ANTIBIOTICS: Vancomycin intra-op  SIGNIFICANT EVENTS: 8/23 - Admit for planned removal of Baker's cyst, VF arrest during case with shock x1.  Returned to ICU   LINES/TUBES: OETT 8/23 - 8/23 (self extubated) R [place] CVL 8/23 >> Foley 8/23 >> L Rad ALine 8/23 >>  PIV x3  ASSESSMENT / PLAN:  PULMONARY A: Acute Hypoxic Respiratory Failure - Post cardiac arrest. H/O Asthma Former Smoker   P:   Continuous Pulse Oximetry Incentive Spirometry q1hr while awake  CARDIOVASCULAR A:  VF Arrest - reported VF  in OR, shock x1  Elevated Troponin I - Wall motion abnormalities noted on TTE but no prior for comparison. Trending downward. Questionable Reaction to Vancomycin - per OR staff Hypotension - Resolved.  P:  Continuous Telemetry monitoring Vitals per unit protocol Consulting Cardiology today Goal SBP >90 & MAP >65  RENAL A:   Hypomagnesemia - Replaced 8/23.  P:   Discontinuing Foley Trending UOP Monitoring electrolytes & renal function daily Replace electrolytes as indicated  LR at 50 ml/hr Checking electrolytes now  GASTROINTESTINAL A:   H/O Liver Cirrhosis H/O GERD   P:   Clear Liquid Diet Restarting Lactulose 20gm bid Protonix PO daily  HEMATOLOGIC A:   Thrombocytopenia - Likely due to cirrhosis. Mild.  P:  Trending cell counts daily w/ CBC Heparin [place] q8hr SCDs  INFECTIOUS A:   No evidence of acute infectious process   P:   Trend fever curve / WBC  Holding  on further Vancomycin  ENDOCRINE A:   Hyperglycemia - No h/o DM.  P:   Continuing SSI per Standard Algorithm Accu-Checks q4hr Checking Hgb A1c  NEUROLOGIC A:   Post Cardiac Arrest - No focal deficits. Post-operative Pain  P:   Fentanyl IV prn   FAMILY  - Updates:  Daughters updated at bedside per Dr. Jamison Neighbor 8/24.  TODAY'S SUMMARY:  60 y.o. male with PMH of asthma, GERD, cirrhosis and known Baker's cyst admitted 8/23 for planned removal.  Intubated without difficulty.  During case developed reported VF arrest requiring shock x1, questionable reaction to vancomycin. Patient now with wall motion abnormalities on his transthoracic echocardiogram. Consult cardiology for further assessment and recommendations. As patient only weaned off vasopressor support very early this morning we'll continue ICU monitoring for signs of clinical decompensation. Patient appears neurologically intact without any deficits post resuscitation.  I spent a total of 34 minutes of critical care time today caring for the  patient, reviewing the patient's electronic medical record, updating family at bedside, and reviewing the patient's electronic medical record.  Donna Christen Jamison Neighbor, M.D. [facility] Pulmonary & Critical Care Pager:  [phone] After 3pm or if no response, call [phone] 8:35 AM 02/23/16

## 2016-02-23 NOTE — Progress Notes (Signed)
Transferred to 2W19 via wheelchair. Portable monitor on. No changes.Report given to [place], [place]

## 2016-02-23 NOTE — Progress Notes (Signed)
PCCM Rounding Note: Patient doing well. Up in a chair visiting with family on room air. Has remained off vasopressor support since 7:30am. Patient seen by cardiology & started on low dose Coreg. Planned for left heart catheterization tomorrow. Patient tolerating clear liquid diet. Transferring patient to telemetry bed for further monitoring. Discontinuing Fentanyl for pain. Ordering Norco prn pain. I will have TRH to follow along for continued support of Internal Medicine. PCCM will sign off as of 8/25.  Sonia Baller Ashok Cordia, M.D. [facility] Pulmonary & Critical Care Pager:  [phone] After 3pm or if no response, call [phone] 4:36 PM 02/23/16

## 2016-02-24 ENCOUNTER — Inpatient Hospital Stay (HOSPITAL_COMMUNITY)
Admission: AD | Disposition: A | Discharge status: Home Health | Payer: Self-pay | Source: Ambulatory Visit | Attending: Orthopedic Surgery

## 2016-02-24 ENCOUNTER — Encounter (HOSPITAL_COMMUNITY): Payer: Self-pay | Admitting: Physician Assistant

## 2016-02-24 DIAGNOSIS — K7460 Unspecified cirrhosis of liver: Secondary | ICD-10-CM

## 2016-02-24 DIAGNOSIS — D696 Thrombocytopenia, unspecified: Secondary | ICD-10-CM

## 2016-02-24 HISTORY — PX: CARDIAC CATHETERIZATION: SHX172

## 2016-02-24 LAB — CBC WITH DIFFERENTIAL/PLATELET
Basophils Absolute: 0 K/uL (ref 0.0–0.1)
Basophils Relative: 0 %
Eosinophils Absolute: 0.1 K/uL (ref 0.0–0.7)
Eosinophils Relative: 5 %
HCT: 41.5 % (ref 39.0–52.0)
Hemoglobin: 13.4 g/dL (ref 13.0–17.0)
Lymphocytes Relative: 15 %
Lymphs Abs: 0.4 K/uL — ABNORMAL LOW (ref 0.7–4.0)
MCH: 29.3 pg (ref 26.0–34.0)
MCHC: 32.3 g/dL (ref 30.0–36.0)
MCV: 90.8 fL (ref 78.0–100.0)
Monocytes Absolute: 0.4 K/uL (ref 0.1–1.0)
Monocytes Relative: 14 %
Neutro Abs: 1.7 K/uL (ref 1.7–7.7)
Neutrophils Relative %: 65 %
Platelets: 60 K/uL — ABNORMAL LOW (ref 150–400)
RBC: 4.57 MIL/uL (ref 4.22–5.81)
RDW: 13.9 % (ref 11.5–15.5)
WBC: 2.6 K/uL — ABNORMAL LOW (ref 4.0–10.5)

## 2016-02-24 LAB — CBC
HCT: 41 % (ref 39.0–52.0)
Hemoglobin: 13.8 g/dL (ref 13.0–17.0)
MCH: 30.2 pg (ref 26.0–34.0)
MCHC: 33.7 g/dL (ref 30.0–36.0)
MCV: 89.7 fL (ref 78.0–100.0)
PLATELETS: 61 10*3/uL — AB (ref 150–400)
RBC: 4.57 MIL/uL (ref 4.22–5.81)
RDW: 13.8 % (ref 11.5–15.5)
WBC: 2.5 10*3/uL — AB (ref 4.0–10.5)

## 2016-02-24 LAB — HEMOGLOBIN A1C
Hgb A1c MFr Bld: 5.7 % — ABNORMAL HIGH (ref 4.8–5.6)
MEAN PLASMA GLUCOSE: 117 mg/dL

## 2016-02-24 LAB — MAGNESIUM: Magnesium: 2.2 mg/dL (ref 1.7–2.4)

## 2016-02-24 LAB — COMPREHENSIVE METABOLIC PANEL WITH GFR
ALT: 78 U/L — ABNORMAL HIGH (ref 17–63)
AST: 66 U/L — ABNORMAL HIGH (ref 15–41)
Albumin: 3.3 g/dL — ABNORMAL LOW (ref 3.5–5.0)
Alkaline Phosphatase: 79 U/L (ref 38–126)
Anion gap: 5 (ref 5–15)
BUN: 9 mg/dL (ref 6–20)
CO2: 29 mmol/L (ref 22–32)
Calcium: 8.4 mg/dL — ABNORMAL LOW (ref 8.9–10.3)
Chloride: 107 mmol/L (ref 101–111)
Creatinine, Ser: 1.03 mg/dL (ref 0.61–1.24)
GFR calc Af Amer: >60 mL/min
GFR calc non Af Amer: >60 mL/min
Glucose, Bld: 94 mg/dL (ref 65–99)
Potassium: 3.7 mmol/L (ref 3.5–5.1)
Sodium: 141 mmol/L (ref 135–145)
Total Bilirubin: 1.8 mg/dL — ABNORMAL HIGH (ref 0.3–1.2)
Total Protein: 5.8 g/dL — ABNORMAL LOW (ref 6.5–8.1)

## 2016-02-24 LAB — PHOSPHORUS: Phosphorus: 2.1 mg/dL — ABNORMAL LOW (ref 2.5–4.6)

## 2016-02-24 LAB — GLUCOSE, CAPILLARY
Glucose-Capillary: 107 mg/dL — ABNORMAL HIGH (ref 65–99)
Glucose-Capillary: 127 mg/dL — ABNORMAL HIGH (ref 65–99)

## 2016-02-24 SURGERY — LEFT HEART CATH AND CORONARY ANGIOGRAPHY
Anesthesia: LOCAL

## 2016-02-24 MED ORDER — ORAL CARE MOUTH RINSE: 15.0000 mL | Freq: Two times a day (BID) | OROMUCOSAL | Status: DC

## 2016-02-24 MED ORDER — FENTANYL CITRATE (PF) 100 MCG/2ML IJ SOLN
INTRAMUSCULAR | Status: AC
  Filled 2016-02-24: qty 2

## 2016-02-24 MED ORDER — FENTANYL CITRATE (PF) 100 MCG/2ML IJ SOLN
INTRAMUSCULAR | Status: DC | PRN
  Administered 2016-02-24: 50 ug via INTRAVENOUS

## 2016-02-24 MED ORDER — HEPARIN (PORCINE) IN NACL 2-0.9 UNIT/ML-% IJ SOLN
INTRAMUSCULAR | Status: DC | PRN
  Administered 2016-02-24: 1000 mL via INTRA_ARTERIAL

## 2016-02-24 MED ORDER — HEPARIN SODIUM (PORCINE) 1000 UNIT/ML IJ SOLN
INTRAMUSCULAR | Status: DC | PRN
Start: 2016-02-24 — End: 2016-02-24
  Administered 2016-02-24: 5000 [IU] via INTRAVENOUS

## 2016-02-24 MED ORDER — LIDOCAINE HCL (PF) 1 % IJ SOLN
INTRAMUSCULAR | Status: DC | PRN
  Administered 2016-02-24: 2 mL

## 2016-02-24 MED ORDER — SODIUM CHLORIDE 0.9 % IV SOLN: 250.0000 mL | INTRAVENOUS | Status: DC | PRN

## 2016-02-24 MED ORDER — MIDAZOLAM HCL 2 MG/2ML IJ SOLN
INTRAMUSCULAR | Status: DC | PRN
  Administered 2016-02-24: 2 mg via INTRAVENOUS

## 2016-02-24 MED ORDER — IOPAMIDOL (ISOVUE-370) INJECTION 76%
INTRAVENOUS | Status: AC
  Filled 2016-02-24: qty 100

## 2016-02-24 MED ORDER — MIDAZOLAM HCL 2 MG/2ML IJ SOLN
INTRAMUSCULAR | Status: AC
  Filled 2016-02-24: qty 2

## 2016-02-24 MED ORDER — HEPARIN (PORCINE) IN NACL 2-0.9 UNIT/ML-% IJ SOLN
INTRAMUSCULAR | Status: AC
  Filled 2016-02-24: qty 1000

## 2016-02-24 MED ORDER — SODIUM CHLORIDE 0.9% FLUSH: 3.0000 mL | INTRAVENOUS | Status: DC | PRN

## 2016-02-24 MED ORDER — VERAPAMIL HCL 2.5 MG/ML IV SOLN
INTRAVENOUS | Status: AC
  Filled 2016-02-24: qty 2

## 2016-02-24 MED ORDER — HEPARIN SODIUM (PORCINE) 1000 UNIT/ML IJ SOLN
INTRAMUSCULAR | Status: AC
  Filled 2016-02-24: qty 1

## 2016-02-24 MED ORDER — IOPAMIDOL (ISOVUE-370) INJECTION 76%
INTRAVENOUS | Status: DC | PRN
  Administered 2016-02-24: 70 mL via INTRA_ARTERIAL

## 2016-02-24 MED ORDER — LIDOCAINE HCL (PF) 1 % IJ SOLN
INTRAMUSCULAR | Status: AC
  Filled 2016-02-24: qty 30

## 2016-02-24 MED ORDER — SODIUM CHLORIDE 0.9% FLUSH: 3.0000 mL | Freq: Two times a day (BID) | INTRAVENOUS | Status: DC

## 2016-02-24 MED ORDER — HYDROCODONE-ACETAMINOPHEN 5-325 MG PO TABS: 1.0000 | ORAL_TABLET | Freq: Four times a day (QID) | ORAL | 0 refills | Status: DC | PRN

## 2016-02-24 MED ORDER — CARVEDILOL 3.125 MG PO TABS: 3.1250 mg | ORAL_TABLET | Freq: Two times a day (BID) | ORAL | 2 refills | Status: DC

## 2016-02-24 MED ORDER — CHLORHEXIDINE GLUCONATE 0.12 % MT SOLN: 15.0000 mL | Freq: Two times a day (BID) | OROMUCOSAL | Status: DC

## 2016-02-24 MED ORDER — SODIUM PHOSPHATES 45 MMOLE/15ML IV SOLN
10.0000 mmol | Freq: Once | INTRAVENOUS | Status: AC
  Administered 2016-02-24: 10 mmol via INTRAVENOUS
  Filled 2016-02-24: qty 3.33

## 2016-02-24 MED ORDER — SODIUM CHLORIDE 0.9 % IV SOLN
INTRAVENOUS | Status: AC
  Administered 2016-02-24: 13:00:00 via INTRAVENOUS

## 2016-02-24 SURGICAL SUPPLY — 10 items
CATH INFINITI 5 FR JL3.5 (CATHETERS) ×2 IMPLANT
CATH INFINITI 5FR MULTPACK ANG (CATHETERS) ×2 IMPLANT
DEVICE RAD COMP TR BAND LRG (VASCULAR PRODUCTS) ×2 IMPLANT
GLIDESHEATH SLEND SS 6F .021 (SHEATH) ×2 IMPLANT
KIT HEART LEFT (KITS) ×2 IMPLANT
PACK CARDIAC CATHETERIZATION (CUSTOM PROCEDURE TRAY) ×2 IMPLANT
SYR MEDRAD MARK V 150ML (SYRINGE) ×2 IMPLANT
TRANSDUCER W/STOPCOCK (MISCELLANEOUS) ×2 IMPLANT
TUBING CIL FLEX 10 FLL-RA (TUBING) ×2 IMPLANT
WIRE SAFE-T 1.5MM-J .035X260CM (WIRE) ×2 IMPLANT

## 2016-02-24 NOTE — Discharge Summary (Signed)
SPORTS MEDICINE & JOINT REPLACEMENT   Stephen Spurling, MD   Laurier Nancy, PA-C [address] [place], [place], [place]  [address]                             [phone]  PATIENT ID: Stephen Burch        MRN:  [id number]          DOB/AGE: 62-Feb-1957 / 62 y.o.    DISCHARGE SUMMARY  ADMISSION DATE:    02/22/2016 DISCHARGE DATE:   02/24/2016   ADMISSION DIAGNOSIS: bakers cyst right knee NSTEMI    DISCHARGE DIAGNOSIS:  bakers cyst right knee    ADDITIONAL DIAGNOSIS: Principal Problem:   Cardiac arrest [facility]) Active Problems:   Encounter for central line placement   Shock circulatory (HCC)   Thrombocytopenia (HCC)   Hypomagnesemia  Past Medical History:  Diagnosis Date  . Arthritis    knees  . Asthma    as a kid  . Cirrhosis (HCC)   . GERD (gastroesophageal reflux disease)   . History of hiatal hernia   . Kidney stones   . Pneumonia    3 years ago  . Thrombocytopenia (HCC)     PROCEDURE: Procedure(s): IRRIGATION AND DEBRIDEMENT KNEE APPLICATION OF WOUND VAC on 02/22/2016  CONSULTS: Treatment Team:  Rounding Lbcardiology, MD   HISTORY:  See H&P in chart  HOSPITAL COURSE:  Stephen Burch is a 60 y.o. admitted on 02/22/2016 and found to have a diagnosis of bakers cyst right knee.  After appropriate laboratory studies were obtained  they were taken to the operating room on 02/22/2016 and underwent Procedure(s): IRRIGATION AND DEBRIDEMENT KNEE APPLICATION OF WOUND VAC.   They were given perioperative antibiotics:  Anti-infectives    Start     Dose/Rate Route Frequency Ordered Stop   02/22/16 1200  vancomycin (VANCOCIN) IVPB 1000 mg/200 mL premix  Status:  Discontinued     1,000 mg 200 mL/hr over 60 Minutes Intravenous  Once 02/22/16 1147 02/22/16 1306    .  Patient given tranexamic acid IV or topical and exparel intra-operatively.  Tolerated the procedure well.    POD# 1: Vital signs were stable.  Patient denied Chest pain, shortness of breath, or calf pain.   Patient was started on Lovenox 30 mg subcutaneously twice daily at 8am.  Consults to PT, OT, and care management were made.  The patient was weight bearing as tolerated.  CPM was placed on the operative leg 0-90 degrees for 6-8 hours a day. When out of the CPM, patient was placed in the foam block to achieve full extension. Incentive spirometry was taught.  Dressing was changed.       POD #2, Continued  PT for ambulation and exercise program.  IV saline locked.  O2 discontinued.    The remainder of the hospital course was dedicated to ambulation and strengthening.   The patient was discharged on 2 Days Post-Op in  Good condition.  Blood products given:none  DIAGNOSTIC STUDIES: Recent vital signs: Patient Vitals for the past 24 hrs:  BP Temp Temp src Pulse Resp SpO2 Weight  02/24/16 1402 129/77 - - 64 - 97 % -  02/24/16 1347 136/78 - - 65 - 97 % -  02/24/16 1332 109/86 - - 65 - 99 % -  02/24/16 1315 126/61 - - 60 - 98 % -  02/24/16 1247 (!) 127/94 - - 71 - 97 % -  02/24/16  1232 129/77 - - 68 - 98 % -  02/24/16 1217 121/76 - - 70 - 97 % -  02/24/16 1208 118/74 - - 62 - 96 % -  02/24/16 1145 - - - 74 - - -  02/24/16 1140 122/81 - - 76 18 97 % -  02/24/16 1135 123/81 - - 66 14 97 % -  02/24/16 1130 137/86 - - 72 14 99 % -  02/24/16 1125 126/79 - - 61 18 97 % -  02/24/16 1120 126/82 - - (!) 57 14 96 % -  02/24/16 1115 131/87 - - (!) 56 18 93 % -  02/24/16 1110 (!) 153/101 - - 73 18 100 % -  02/24/16 1109 - - - 71 - - -  02/24/16 1107 - - - (!) 0 17 (!) 0 % -  02/24/16 1103 - - - (!) 0 (!) 0 (!) 0 % -  02/24/16 1058 - - - (!) 0 (!) 0 (!) 0 % -  02/24/16 0919 113/74 - - (!) 56 - - -  02/24/16 0326 127/72 99.2 F (37.3 C) Oral 69 17 95 % 97 kg (213 lb 13.5 oz)  02/23/16 2026 113/61 98.6 F (37 C) Oral 71 18 96 % -  02/23/16 1800 (!) 104/54 98.7 F (37.1 C) Oral 77 18 96 % -       Recent laboratory studies:  Recent Labs  02/21/16 1428 02/22/16 1250 02/22/16 1430 02/23/16 0230  02/24/16 0410 02/24/16 0945  WBC 4.3 4.0 1.7* 9.1 2.6* 2.5*  HGB 15.5 16.1 15.8 14.4 13.4 13.8  HCT 45.7 47.5 46.6 43.1 41.5 41.0  PLT 85* 83* 114* 132* 60* 61*    Recent Labs  02/21/16 1428 02/22/16 1430 02/23/16 0924 02/24/16 0410  NA 139 138 140 141  K 3.8 3.5 3.7 3.7  CL 108 108 108 107  CO2 24 21* 23 29  BUN 11 14 14 9   CREATININE 0.95 1.26* 1.19 1.03  GLUCOSE 126* 137* 127* 94  CALCIUM 8.7* 8.0* 8.0* 8.4*   Lab Results  Component Value Date   INR 1.21 02/22/2016   INR 1.00 05/13/2013   INR 1.00 09/12/2012     Recent Radiographic Studies :  Dg Chest Port 1 View  Result Date: 02/22/2016 CLINICAL DATA:  Status post central line placement and intubation today. EXAM: PORTABLE CHEST 1 VIEW COMPARISON:  PA and lateral chest 07/29/2012. FINDINGS: Endotracheal tube is in place with the tip at the level of clavicular heads well above the carina. Right subclavian catheter tip projects over the mid to lower superior vena cava. Defibrillator pad is in place over the left chest. Lung volumes are low but the lungs are clear. No pneumothorax or pleural fluid. Heart size is normal. IMPRESSION: ETT and right subclavian catheter projecting good position. Negative for pneumothorax. No acute disease. Electronically Signed   By: Drusilla Kanner M.D.   On: 02/22/2016 16:40   Dg Knee Complete 4 Views Right  Result Date: 02/05/2016 CLINICAL DATA:  Right knee pain and burning which began goes tearing. History of prior knee replacement. No known injury. Initial encounter. EXAM: RIGHT KNEE - COMPLETE 4+ VIEW COMPARISON:  Two views of the right knee 03/23/2009. FINDINGS: The patient is status post right total knee arthroplasty. No fracture is seen. Hardware is intact. Prominent osteophytosis is seen about the knee and is new since the prior examination. There are likely loose bodies in the joint. Moderate joint effusion is identified. IMPRESSION: Negative for  fracture. Status post right total knee  replacement. Extensive osteophytosis about the knee is new since the prior examination. There are likely loose bodies in the joint. Moderate joint effusion. Electronically Signed   By: Drusilla Kanner M.D.   On: 02/05/2016 18:41   Nm Bone Scan 3 Phase Lower Extremity  Result Date: 02/13/2016 CLINICAL DATA:  RIGHT knee replacement 9 years ago. LEFT knee replacement 5 to 6 years ago. RIGHT knee pain EXAM: NUCLEAR MEDICINE 3-PHASE BONE SCAN TECHNIQUE: Radionuclide angiographic images, immediate static blood pool images, and 3-hour delayed static images were obtained of the knees after intravenous injection of radiopharmaceutical. RADIOPHARMACEUTICALS:  Twenty-one mCi Tc-69m MDP COMPARISON:  Radiograph 02/05/16 FINDINGS: Vascular phase: Mild increased blood flow to RIGHT knee compared to the LEFT. Blood pool phase: Mild increased blood pool activity in the RIGHT knee outlining the joint capsule. Delayed phase: Mild to moderate uptake along the RIGHT tibial plateau which is increased above the other prosthetic surfaces of LEFT and RIGHT knee. IMPRESSION: Mild increased vascular flow, hyperemia and delayed uptake in the RIGHT knee localizing to tibial plateau could indicate early loosening. Electronically Signed   By: Genevive Bi M.D.   On: 02/13/2016 14:50    DISCHARGE INSTRUCTIONS: Discharge Instructions    Call MD / Call 911    Complete by:  As directed   If you experience chest pain or shortness of breath, CALL 911 and be transported to the hospital emergency room.  If you develope a fever above 101 F, pus (white drainage) or increased drainage or redness at the wound, or calf pain, call your surgeon's office.   Constipation Prevention    Complete by:  As directed   Drink plenty of fluids.  Prune juice may be helpful.  You may use a stool softener, such as Colace (over the counter) 100 mg twice a day.  Use MiraLax (over the counter) for constipation as needed.   Diet - low sodium heart healthy     Complete by:  As directed   Discharge instructions    Complete by:  As directed   INSTRUCTIONS AFTER JOINT REPLACEMENT   Remove items at home which could result in a fall. This includes throw rugs or furniture in walking pathways ICE to the affected joint every three hours while awake for 30 minutes at a time, for at least the first 3-5 days, and then as needed for pain and swelling.  Continue to use ice for pain and swelling. You may notice swelling that will progress down to the foot and ankle.  This is normal after surgery.  Elevate your leg when you are not up walking on it.   Continue to use the breathing machine you got in the hospital (incentive spirometer) which will help keep your temperature down.  It is common for your temperature to cycle up and down following surgery, especially at night when you are not up moving around and exerting yourself.  The breathing machine keeps your lungs expanded and your temperature down.   DIET:  As you were doing prior to hospitalization, we recommend a well-balanced diet.  DRESSING / WOUND CARE / SHOWERING  Keep the surgical dressing until follow up.   IF THE DRESSING FALLS OFF or the wound gets wet inside, change the dressing with sterile gauze.  Please use good hand washing techniques before changing the dressing.  Do not use any lotions or creams on the incision until instructed by your surgeon.    ACTIVITY  Increase  activity slowly as tolerated, but follow the weight bearing instructions below.   No driving for 6 weeks or until further direction given by your physician.  You cannot drive while taking narcotics.  No lifting or carrying greater than 10 lbs. until further directed by your surgeon. Avoid periods of inactivity such as sitting longer than an hour when not asleep. This helps prevent blood clots.  You may return to work once you are authorized by your doctor.     WEIGHT BEARING   Weight bearing as tolerated with assist device  (walker, cane, etc) as directed, use it as long as suggested by your surgeon or therapist, typically at least 4-6 weeks.   EXERCISES  Results after joint replacement surgery are often greatly improved when you follow the exercise, range of motion and muscle strengthening exercises prescribed by your doctor. Safety measures are also important to protect the joint from further injury. Any time any of these exercises cause you to have increased pain or swelling, decrease what you are doing until you are comfortable again and then slowly increase them. If you have problems or questions, call your caregiver or physical therapist for advice.   Rehabilitation is important following a joint replacement. After just a few days of immobilization, the muscles of the leg can become weakened and shrink (atrophy).  These exercises are designed to build up the tone and strength of the thigh and leg muscles and to improve motion. Often times heat used for twenty to thirty minutes before working out will loosen up your tissues and help with improving the range of motion but do not use heat for the first two weeks following surgery (sometimes heat can increase post-operative swelling).   These exercises can be done on a training (exercise) mat, on the floor, on a table or on a bed. Use whatever works the best and is most comfortable for you.    Use music or television while you are exercising so that the exercises are a pleasant break in your day. This will make your life better with the exercises acting as a break in your routine that you can look forward to.   Perform all exercises about fifteen times, three times per day or as directed.  You should exercise both the operative leg and the other leg as well.   Exercises include:   Quad Sets - Tighten up the muscle on the front of the thigh (Quad) and hold for 5-10 seconds.   Straight Leg Raises - With your knee straight (if you were given a brace, keep it on), lift the  leg to 60 degrees, hold for 3 seconds, and slowly lower the leg.  Perform this exercise against resistance later as your leg gets stronger.  Leg Slides: Lying on your back, slowly slide your foot toward your buttocks, bending your knee up off the floor (only go as far as is comfortable). Then slowly slide your foot back down until your leg is flat on the floor again.  Angel Wings: Lying on your back spread your legs to the side as far apart as you can without causing discomfort.  Hamstring Strength:  Lying on your back, push your heel against the floor with your leg straight by tightening up the muscles of your buttocks.  Repeat, but this time bend your knee to a comfortable angle, and push your heel against the floor.  You may put a pillow under the heel to make it more comfortable if necessary.  A rehabilitation program following joint replacement surgery can speed recovery and prevent re-injury in the future due to weakened muscles. Contact your doctor or a physical therapist for more information on knee rehabilitation.    CONSTIPATION  Constipation is defined medically as fewer than three stools per week and severe constipation as less than one stool per week.  Even if you have a regular bowel pattern at home, your normal regimen is likely to be disrupted due to multiple reasons following surgery.  Combination of anesthesia, postoperative narcotics, change in appetite and fluid intake all can affect your bowels.   YOU MUST use at least one of the following options; they are listed in order of increasing strength to get the job done.  They are all available over the counter, and you may need to use some, POSSIBLY even all of these options:    Drink plenty of fluids (prune juice may be helpful) and high fiber foods Colace 100 mg by mouth twice a day  Senokot for constipation as directed and as needed Dulcolax (bisacodyl), take with full glass of water  Miralax (polyethylene glycol) once or twice  a day as needed.  If you have tried all these things and are unable to have a bowel movement in the first 3-4 days after surgery call either your surgeon or your primary doctor.    If you experience loose stools or diarrhea, hold the medications until you stool forms back up.  If your symptoms do not get better within 1 week or if they get worse, check with your doctor.  If you experience "the worst abdominal pain ever" or develop nausea or vomiting, please contact the office immediately for further recommendations for treatment.   ITCHING:  If you experience itching with your medications, try taking only a single pain pill, or even half a pain pill at a time.  You can also use Benadryl over the counter for itching or also to help with sleep.   TED HOSE STOCKINGS:  Use stockings on both legs until for at least 2 weeks or as directed by physician office. They may be removed at night for sleeping.  MEDICATIONS:  See your medication summary on the "After Visit Summary" that nursing will review with you.  You may have some home medications which will be placed on hold until you complete the course of blood thinner medication.  It is important for you to complete the blood thinner medication as prescribed.  PRECAUTIONS:  If you experience chest pain or shortness of breath - call 911 immediately for transfer to the hospital emergency department.   If you develop a fever greater that 101 F, purulent drainage from wound, increased redness or drainage from wound, foul odor from the wound/dressing, or calf pain - CONTACT YOUR SURGEON.                                                   FOLLOW-UP APPOINTMENTS:  If you do not already have a post-op appointment, please call the office for an appointment to be seen by your surgeon.  Guidelines for how soon to be seen are listed in your "After Visit Summary", but are typically between 1-4 weeks after surgery.  OTHER INSTRUCTIONS:   Knee Replacement:  Do not  place pillow under knee, focus on keeping the knee straight while resting.  CPM instructions: 0-90 degrees, 2 hours in the morning, 2 hours in the afternoon, and 2 hours in the evening. Place foam block, curve side up under heel at all times except when in CPM or when walking.  DO NOT modify, tear, cut, or change the foam block in any way.  MAKE SURE YOU:  Understand these instructions.  Get help right away if you are not doing well or get worse.    Thank you for letting us be a part of your medical care team.  It is a privilege we respect greatly.  We hope these instructions will help you stay on track for a fast and full recovery!   Increase activity slowly as tolerated    Complete by:  As directed      DISCHARGE MEDICATIONS:     Medication List    STOP taking these medications   ciprofloxacin 500 MG tablet Commonly known as:  CIPRO     TAKE these medications   carvedilol 3.125 MG tablet Commonly known as:  COREG Take 1 tablet (3.125 mg total) by mouth 2 (two) times daily with a meal.   HYDROcodone-acetaminophen 5-325 MG tablet Commonly known as:  NORCO/VICODIN Take 1 tablet by mouth every 6 (six) hours as needed for moderate pain. What changed:  when to take this  reasons to take this   lactulose 10 GM/15ML solution Commonly known as:  CHRONULAC Take 30 mLs (20 g total) by mouth 2 (two) times daily.   pantoprazole 40 MG tablet Commonly known as:  PROTONIX Take 1 tablet (40 mg total) by mouth daily.       FOLLOW UP VISIT:    DISPOSITION: HOME VS. SNF  CONDITION:  Good   Stephen Burch 02/24/2016, 5:41 PM

## 2016-02-24 NOTE — Progress Notes (Signed)
Patient Name: Stephen Burch Date of Encounter: 02/24/2016  Principal Problem:   Cardiac arrest [facility]) Active Problems:   Encounter for central line placement   Shock circulatory ([place])   Thrombocytopenia [facility])   Primary Cardiologist: Dr Acie Fredrickson Patient Profile: 60 y.o. year old male with a history of arthritis s/p R-TKR, GERD, hernia repair, renal stones, non-ETOH cirrhosis, PNA and asthma (not defined), came [facility] on 8/23 for planned Bakers Cyst removal. Post-op had ST elevation>>hypoxia/hypotension>>pulseless>>CPR>>Epi>>Vfib>>shock x 2>>pulses. EF 35% by echo (no previous)  SUBJECTIVE: No chest pain, no SOB, has had low platelets before. WBCs have also been low before. Leg is not too bad  OBJECTIVE Vitals:   02/23/16 1800 02/23/16 2026 02/24/16 0326 02/24/16 0919  BP: (!) 104/54 113/61 127/72 113/74  Pulse: 77 71 69 (!) 56  Resp: 18 18 17    Temp: 98.7 F (37.1 C) 98.6 F (37 C) 99.2 F (37.3 C)   TempSrc: Oral Oral Oral   SpO2: 96% 96% 95%   Weight:   213 lb 13.5 oz (97 kg)   Height:        Intake/Output Summary (Last 24 hours) at 02/24/16 1005 Last data filed at 02/24/16 7408  Gross per 24 hour  Intake              900 ml  Output             3550 ml  Net            -2650 ml   Filed Weights   02/23/16 0400 02/23/16 1550 02/24/16 0326  Weight: 226 lb 6.6 oz (102.7 kg) 226 lb 6.6 oz (102.7 kg) 213 lb 13.5 oz (97 kg)    PHYSICAL EXAM General: Well developed, well nourished, male in no acute distress. Head: Normocephalic, atraumatic.  Neck: Supple without bruits, JVD not elevated. Lungs:  Resp regular and unlabored, CTA. Heart: RRR, S1, S2, no S3, S4, or murmur; no rub. Abdomen: Soft, non-tender, non-distended, BS + x 4.  Extremities: No clubbing, cyanosis, edema.  Neuro: Alert and oriented X 3. Moves all extremities spontaneously. Psych: Normal affect.  LABS: CBC:  Recent Labs  02/23/16 0230 02/24/16 0410  WBC 9.1 2.6*  NEUTROABS 8.0* 1.7  HGB 14.4  13.4  HCT 43.1 41.5  MCV 89.0 90.8  PLT 132* 60*   INR:  Recent Labs  02/22/16 1430  INR 1.44   Basic Metabolic Panel:  Recent Labs  02/23/16 0924 02/24/16 0410  NA 140 141  K 3.7 3.7  CL 108 107  CO2 23 29  GLUCOSE 127* 94  BUN 14 9  CREATININE 1.19 1.03  CALCIUM 8.0* 8.4*  MG 2.1 2.2  PHOS 3.4 2.1*   Liver Function Tests:  Recent Labs  02/22/16 1430 02/23/16 0924 02/24/16 0410  AST 169*  --  66*  ALT 138*  --  78*  ALKPHOS 116  --  79  BILITOT 1.5*  --  1.8*  PROT 5.6*  --  5.8*  ALBUMIN 3.5 3.0* 3.3*   Cardiac Enzymes:  Recent Labs  02/22/16 1430 02/22/16 1940 02/23/16 0230  TROPONINI 0.38* 1.85* 1.13*   BNP:  B Natriuretic Peptide  Date/Time Value Ref Range Status  02/22/2016 02:02 PM 32.2 0.0 - 100.0 pg/mL Final   Hemoglobin A1C:  Recent Labs  02/23/16 0900  HGBA1C 5.7*   Fasting Lipid Panel:  Recent Labs  02/23/16 0230  TRIG 111   TELE: SR, [place]  Radiology/Studies: Dg Chest Port 1 View  Result Date: 02/22/2016 CLINICAL DATA:  Status post central line placement and intubation today. EXAM: PORTABLE CHEST 1 VIEW COMPARISON:  PA and lateral chest 07/29/2012. FINDINGS: Endotracheal tube is in place with the tip at the level of clavicular heads well above the carina. Right subclavian catheter tip projects over the mid to lower superior vena cava. Defibrillator pad is in place over the left chest. Lung volumes are low but the lungs are clear. No pneumothorax or pleural fluid. Heart size is normal. IMPRESSION: ETT and right subclavian catheter projecting good position. Negative for pneumothorax. No acute disease. Electronically Signed   By: Inge Rise M.D.   On: 02/22/2016 16:40     Current Medications:  . antiseptic oral rinse  7 mL Mouth Rinse BID  . carvedilol  3.125 mg Oral BID WC  . insulin aspart  0-5 Units Subcutaneous QHS  . insulin aspart  0-9 Units Subcutaneous TID WC  . lactulose  20 g Oral BID  . pantoprazole   40 mg Oral Daily  . sodium chloride flush  10-40 mL Intracatheter Q12H  . sodium phosphate  Dextrose 5% IVPB  10 mmol Intravenous Once   . lactated ringers 50 mL/hr at 02/23/16 1600    ASSESSMENT AND PLAN: Principal Problem:   Cardiac arrest (HCC)/elevated troponin - per notes, ST elevation>>hypoxia/hypotension>>no pulse>>VF after epi - BP low after resuscitation, improved w/ IVF - tolerating low-dose BB - EF low, cath today  Active Problems:   Encounter for central line placement - per IM    Shock circulatory ([place]) - improved    Thrombocytopenia - has had since 2014, about the time his cirrhosis was diagnosed - lowest plt count before today was 79, generally they are > 90 - LFTs are trending down, recheck CBC - no bleeding issues - cath on hold till CBC results reviewed. Anderson Malta is aware.  Jonetta Speak , PA-C 10:04 AM 02/24/2016   Attending Note:   The patient was seen and examined.  Agree with assessment and plan as noted above.  Changes made to the above note as needed.  Patient seen and independently examined with Rosaria Ferries, PA .   We discussed all aspects of the encounter. I agree with the assessment and plan as stated above.  Cath is normal - normal coronaries, normal LV function    I have spent a total of 20 minutes with patient reviewing hospital  notes , telemetry, EKGs, labs and examining patient as well as establishing an assessment and plan that was discussed with the patient. > 50% of time was spent in direct patient care.  Patient is stable for DC He can follow up with his medical doctor and may see [place] as needed    Thayer Headings, Brooke Bonito., MD, [facility] 02/24/2016, 4:21 PM [address],  [place] Pager [phone]

## 2016-02-24 NOTE — Progress Notes (Signed)
PROGRESS NOTE/consult    Stephen Burch  WUJ:[id number] DOB: 09-09-1955 DOA: 02/22/2016 PCP: Mila Merry, MD   Brief Narrative:  60 y/o M with PMH of arthritis s/p total knee replacement, GERD, inguinal hiatal hernia repair, renal stones, cirrhosis, pneumonia and asthma (not defined) who presented to [facility] on 8/23 for planned Bakers Cyst removal.    Per chart review, the patient has been followed by [facility] for right leg pain that began several months before admission with a gradual worsening.  He had been treated with NSAIDs and activity modification without relief.    The patient was admitted 8/23 for planned removal of Bakers cyst.  He was intubated per anesthesia (grade I view with a MAC 4) without noted difficulty.  Per report, during the case he developed a ST elevation, hypoxia/hypotension with pulselessness patient received CPR and epinephrine and went into VF arrest requiring shock x2. Patient was subsequently admitted to the ICU under the critical care team. 2-D echo which was done showed a EF of 30-35% with severe hypokinesis of the mid apical anteroseptal and apical myocardium with grade 1 diastolic dysfunction. Cardiology was consulted patient underwent cardiac catheterization on 02/24/2016 which showed no angiographic evidence of coronary artery disease, normal LV systolic function. Patient self extubated in the ICU and was subsequently transferred to the medical floor, under the orthopedic service. Critical care medicine and asked that [facility] follow along for continued support of internal medicine.   Assessment & Plan:   Principal Problem:   Cardiac arrest [facility]) Active Problems:   Encounter for central line placement   Shock circulatory (HCC)   Thrombocytopenia (HCC)  #1 cardiac arrest/elevated troponin Per notes patient procedure had a ST elevation leading to hypoxia and hypotension pulselessness and V. fib after epinephrine requiring shock 2. Patient was  subsequently admitted to the ICU. 2-D echo showed a EF of 30-35% with severe hypokinesis of the mid apical anteroseptal and apical myocardium with grade 1 diastolic dysfunction. Cardiology was consulted. Patient's blood pressure responded to IV fluids. Patient placed on a low-dose beta blocker with Coreg. Patient underwent cardiac catheterization on 02/24/2016 with no angiographic evidence of coronary artery disease, normal left ventricular systolic function. Per cardiology.  #2 acute hypoxic respiratory failure postcardiac arrest Results. Patient self extubated. Patient with no shortness of breath. Patient currently with sats of 97% on room air. Patient was being followed by critical care.  #3 hypomagnesemia Repleted.  #4 history of liver cirrhosis Stable. Continue PPI and lactulose. Outpatient follow-up with GI.  #5 chronic thrombocytopenia Likely secondary to cirrhosis. Patient denies any bleeding. Prophylactic heparin has been discontinued. Stable. Follow.  #6 chronic leukopenia Stable. No signs of infection. Patient afebrile. No need for and about except this time.  Patient is currently afebrile, hemodynamically stable. Patient's electrolytes have been repleted and renal function is stable. Patient with history of cirrhosis and a such does have some transaminitis which have improved. Patient's history of cirrhosis is compensated. Patient's chronic medical issues are all stable. Patient is currently stable from a medical standpoint.  Will sign off.    DVT prophylaxis: SCDs Code Status: Full Family Communication: Updated patient. Disposition Plan: Per primary team. Patient is currently medically stable to be discharged from a medical standpoint.   Consultants:   PCCM : Dr Jamison Neighbor 02/22/2016  Cardiology: Dr. Elease Hashimoto 02/23/2016  Procedures:   Chest x-ray 02/22/2016  Cardiac catheterization 02/24/2016  2-D echo 02/22/2016  Right open irrigation and debridement with VAC sponge  placement 02/22/2016 per Dr.  Lucey  STUDIES:  EKG 8/23:  Sinus Tach w/ QTc . Questionable Q-waves in III & AVF. No obvious ischemia. Port CXR 8/23: Endotracheal tube in good position. Right subclavian central venous catheter in good position. Low lung volumes. No focal opacity. EKG 8/23:  QTc . Sinus tachycardia. No obvious ischemia.  TTE 8/23: EF 30-35% with severe hypokinesis. Wall motion abnormalities. Grade 1 diastolic dysfunction. LA & RA normal in size. RV normal in size and function. No aortic stenosis or regurgitation. Aortic root normal in size. No mitral stenosis or regurgitation. Trivial pulmonic regurgitation. Trivial tricuspid regurgitation. No pericardial effusion.  LINES/TUBES: OETT 8/23 - 8/23 (self extubated) R Fedora CVL 8/23 >> Foley 8/23 >> L Rad ALine 8/23 >>  PIV x3   Antimicrobials:   IV vancomycin 1 g IV 1  02/22/2016   Subjective: Patient denies any chest pain. No shortness of breath. No nausea. No vomiting.  Objective: Vitals:   02/24/16 1135 02/24/16 1140 02/24/16 1145 02/24/16 1208  BP: 123/81 122/81  118/74  Pulse: 66 76 74 62  Resp: 14 18    Temp:      TempSrc:      SpO2: 97% 97%  96%  Weight:      Height:        Intake/Output Summary (Last 24 hours) at 02/24/16 1357 Last data filed at 02/24/16 0919  Gross per 24 hour  Intake              510 ml  Output             3350 ml  Net            -2840 ml   Filed Weights   02/23/16 0400 02/23/16 1550 02/24/16 0326  Weight: 102.7 kg (226 lb 6.6 oz) 102.7 kg (226 lb 6.6 oz) 97 kg (213 lb 13.5 oz)    Examination:  General exam: Appears calm and comfortable  Respiratory system: Clear to auscultation. Respiratory effort normal. Cardiovascular system: S1 & S2 heard, RRR. No JVD, murmurs, rubs, gallops or clicks. No pedal edema. Gastrointestinal system: Abdomen is nondistended, soft and nontender. No organomegaly or masses felt. Normal bowel sounds heard. Central nervous system: Alert and  oriented. No focal neurological deficits. Extremities: Symmetric 5 x 5 power. Skin: No rashes, lesions or ulcers Psychiatry: Judgement and insight appear normal. Mood & affect appropriate.     Data Reviewed: I have personally reviewed following labs and imaging studies  CBC:  Recent Labs Lab 02/22/16 1250 02/22/16 1430 02/23/16 0230 02/24/16 0410 02/24/16 0945  WBC 4.0 1.7* 9.1 2.6* 2.5*  NEUTROABS  --   --  8.0* 1.7  --   HGB 16.1 15.8 14.4 13.4 13.8  HCT 47.5 46.6 43.1 41.5 41.0  MCV 90.5 90.3 89.0 90.8 89.7  PLT 83* 114* 132* 60* 61*   Basic Metabolic Panel:  Recent Labs Lab 02/21/16 1428 02/22/16 1430 02/23/16 0924 02/24/16 0410  NA 139 138 140 141  K 3.8 3.5 3.7 3.7  CL 108 108 108 107  CO2 24 21* 23 29  GLUCOSE 126* 137* 127* 94  BUN 11 14 14 9   CREATININE 0.95 1.26* 1.19 1.03  CALCIUM 8.7* 8.0* 8.0* 8.4*  MG  --  1.6* 2.1 2.2  PHOS  --  4.7* 3.4 2.1*   GFR: Estimated Creatinine Clearance: 91 mL/min (by C-G formula based on SCr of 1.03 mg/dL). Liver Function Tests:  Recent Labs Lab 02/21/16 1428 02/22/16 1430 02/23/16 0924 02/24/16 0410  AST 32 169*  --  66*  ALT 35 138*  --  78*  ALKPHOS 109 116  --  79  BILITOT 1.3* 1.5*  --  1.8*  PROT 6.4* 5.6*  --  5.8*  ALBUMIN 4.2 3.5 3.0* 3.3*   No results for input(s): LIPASE, AMYLASE in the last 168 hours. No results for input(s): AMMONIA in the last 168 hours. Coagulation Profile:  Recent Labs Lab 02/22/16 1430  INR 1.21   Cardiac Enzymes:  Recent Labs Lab 02/22/16 1250 02/22/16 1430 02/22/16 1940 02/23/16 0230  TROPONINI <0.03 0.38* 1.85* 1.13*   BNP (last 3 results) No results for input(s): PROBNP in the last 8760 hours. HbA1C:  Recent Labs  02/23/16 0900  HGBA1C 5.7*   CBG:  Recent Labs Lab 02/23/16 0713 02/23/16 1207 02/23/16 1527 02/23/16 2044 02/24/16 0636  GLUCAP 118* 130* 118* 119* 107*   Lipid Profile:  Recent Labs  02/23/16 0230  TRIG 111   Thyroid  Function Tests: No results for input(s): TSH, T4TOTAL, FREET4, T3FREE, THYROIDAB in the last 72 hours. Anemia Panel: No results for input(s): VITAMINB12, FOLATE, FERRITIN, TIBC, IRON, RETICCTPCT in the last 72 hours. Sepsis Labs:  Recent Labs Lab 02/22/16 1430  LATICACIDVEN 2.5*    Recent Results (from the past 240 hour(s))  MRSA PCR Screening     Status: None   Collection Time: 02/22/16  6:01 PM  Result Value Ref Range Status   MRSA by PCR NEGATIVE NEGATIVE Final    Comment:        The GeneXpert MRSA Assay (FDA approved for NASAL specimens only), is one component of a comprehensive MRSA colonization surveillance program. It is not intended to diagnose MRSA infection nor to guide or monitor treatment for MRSA infections.          Radiology Studies: Dg Chest Port 1 View  Result Date: 02/22/2016 CLINICAL DATA:  Status post central line placement and intubation today. EXAM: PORTABLE CHEST 1 VIEW COMPARISON:  PA and lateral chest 07/29/2012. FINDINGS: Endotracheal tube is in place with the tip at the level of clavicular heads well above the carina. Right subclavian catheter tip projects over the mid to lower superior vena cava. Defibrillator pad is in place over the left chest. Lung volumes are low but the lungs are clear. No pneumothorax or pleural fluid. Heart size is normal. IMPRESSION: ETT and right subclavian catheter projecting good position. Negative for pneumothorax. No acute disease. Electronically Signed   By: Drusilla Kanner M.D.   On: 02/22/2016 16:40        Scheduled Meds: . antiseptic oral rinse  7 mL Mouth Rinse BID  . carvedilol  3.125 mg Oral BID WC  . insulin aspart  0-5 Units Subcutaneous QHS  . insulin aspart  0-9 Units Subcutaneous TID WC  . lactulose  20 g Oral BID  . pantoprazole  40 mg Oral Daily  . sodium chloride flush  10-40 mL Intracatheter Q12H  . sodium chloride flush  3 mL Intravenous Q12H  . sodium phosphate  Dextrose 5% IVPB  10 mmol  Intravenous Once   Continuous Infusions: . sodium chloride    . lactated ringers 50 mL/hr at 02/23/16 1600     LOS: 2 days    Time spent: 35 minutes    THOMPSON,DANIEL, MD [facility] Pager [phone]  If 7PM-7AM, please contact night-coverage [email or web address] Password [email or web address] 02/24/2016, 1:57 PM

## 2016-02-24 NOTE — Progress Notes (Addendum)
Spoke with Carlyon Shadow PAC several times regarding patient and discharge this evening. PAC has contacted [employer] and stated that they have an on-call person to come and change out VAC to home [place] but it would be in a couple of hours as they were coming from another town. The [facility] also stated he called the home health agency and they are aware of the patients discharge tonight.  Orders were written for home health and on call case manager was paged through [facility] system and made aware of [place] orders. Carlyon Shadow [facility] stated that once home vac was brought by [facility] rep that he could be discharged home. Report given to on coming RN and Agricultural consultant . Siddhi Dornbush, Bettina Gavia RN

## 2016-02-24 NOTE — Progress Notes (Signed)
SPORTS MEDICINE AND JOINT REPLACEMENT  Lara Mulch, MD    Carlyon Shadow, PA-C [place], [place], [place]  [id number]                             [phone]   PROGRESS NOTE  Subjective:  negative for Chest Pain  negative for Shortness of Breath  negative for Nausea/Vomiting   negative for Calf Pain  negative for Bowel Movement   Tolerating Diet: yes         Patient reports pain as 2 on 0-10 scale.    Objective: Vital signs in last 24 hours:   Patient Vitals for the past 24 hrs:  BP Temp Temp src Pulse Resp SpO2 Weight  02/24/16 1402 129/77 - - 64 - 97 % -  02/24/16 1347 136/78 - - 65 - 97 % -  02/24/16 1332 109/86 - - 65 - 99 % -  02/24/16 1315 126/61 - - 60 - 98 % -  02/24/16 1247 (!) 127/94 - - 71 - 97 % -  02/24/16 1232 129/77 - - 68 - 98 % -  02/24/16 1217 121/76 - - 70 - 97 % -  02/24/16 1208 118/74 - - 62 - 96 % -  02/24/16 1145 - - - 74 - - -  02/24/16 1140 122/81 - - 76 18 97 % -  02/24/16 1135 123/81 - - 66 14 97 % -  02/24/16 1130 137/86 - - 72 14 99 % -  02/24/16 1125 126/79 - - 61 18 97 % -  02/24/16 1120 126/82 - - (!) 57 14 96 % -  02/24/16 1115 131/87 - - (!) 56 18 93 % -  02/24/16 1110 (!) 153/101 - - 73 18 100 % -  02/24/16 1109 - - - 71 - - -  02/24/16 1107 - - - (!) 0 17 (!) 0 % -  02/24/16 1103 - - - (!) 0 (!) 0 (!) 0 % -  02/24/16 1058 - - - (!) 0 (!) 0 (!) 0 % -  02/24/16 0919 113/74 - - (!) 56 - - -  02/24/16 0326 127/72 99.2 F (37.3 C) Oral 69 17 95 % 97 kg (213 lb 13.5 oz)  02/23/16 2026 113/61 98.6 F (37 C) Oral 71 18 96 % -  02/23/16 1800 (!) 104/54 98.7 F (37.1 C) Oral 77 18 96 % -    @flow {1959:LAST@   Intake/Output from previous day:   08/24 0701 - 08/25 0700 In: 1057.5 [P.O.:590; I.V.:467.5] Out: 4125 [Urine:4125]   Intake/Output this shift:   08/25 0701 - 08/25 1900 In: -  Out: 1700 [Urine:1700]   Intake/Output      08/24 0701 - 08/25 0700 08/25 0701 - 08/26 0700   P.O. 590    I.V. (mL/kg) 467.5 (4.8)     NG/GT     IV Piggyback     Total Intake(mL/kg) 1057.5 (10.9)    Urine (mL/kg/hr) 4125 (1.8) 1700 (1.7)   Emesis/NG output     Drains 0 (0)    Blood     Total Output 4125 1700   Net -3067.5 -1700           LABORATORY DATA:  Recent Labs  02/21/16 1428 02/22/16 1250 02/22/16 1430 02/23/16 0230 02/24/16 0410 02/24/16 0945  WBC 4.3 4.0 1.7* 9.1 2.6* 2.5*  HGB 15.5 16.1 15.8 14.4 13.4 13.8  HCT 45.7 47.5 46.6 43.1  41.5 41.0  PLT 85* 83* 114* 132* 60* 61*    Recent Labs  02/21/16 1428 02/22/16 1430 02/23/16 0924 02/24/16 0410  NA 139 138 140 141  K 3.8 3.5 3.7 3.7  CL 108 108 108 107  CO2 24 21* 23 29  BUN 11 14 14 9   CREATININE 0.95 1.26* 1.19 1.03  GLUCOSE 126* 137* 127* 94  CALCIUM 8.7* 8.0* 8.0* 8.4*   Lab Results  Component Value Date   INR 1.21 02/22/2016   INR 1.00 05/13/2013   INR 1.00 09/12/2012    Examination:  General appearance: alert, cooperative and no distress Extremities: extremities normal, atraumatic, no cyanosis or edema  Wound Exam: clean, dry, intact   Drainage:  None: wound tissue dry  Motor Exam: Quadriceps and Hamstrings Intact  Sensory Exam: Superficial Peroneal, Deep Peroneal and Tibial normal   Assessment:    2 Days Post-Op  Procedure(s) (LRB): IRRIGATION AND DEBRIDEMENT KNEE (Right) APPLICATION OF WOUND VAC (Right)  ADDITIONAL DIAGNOSIS:  Principal Problem:   Cardiac arrest (HCC) Active Problems:   Encounter for central line placement   Shock circulatory (HCC)   Thrombocytopenia (HCC)   Hypomagnesemia  Acute Blood Loss Anemia   Plan: Physical Therapy as ordered Weight Bearing as Tolerated (WBAT)  DVT Prophylaxis:  None  DISCHARGE PLAN: Home  DISCHARGE NEEDS: home health wound vac checks   Patient has been cleared by ccm, hospitalists, cardiologist  Ortho now signing off and patient will be discharged. To follow up in our office in 1 week.         Donia Ast 02/24/2016, 5:36 PM

## 2016-02-24 NOTE — H&P (View-Only) (Signed)
CARDIOLOGY CONSULT NOTE   Patient ID: Stephen Burch MRN: [id number] DOB/AGE: [birth year] 60 y.o.  Admit date: 02/22/2016  Primary Physician   Mila Merry, MD Primary Cardiologist   New Reason for Consultation   Abnormal echo, decreased EF Requesting MD: Dr Jamison Neighbor  NWG:[id number] Stephen Burch is a 60 y.o. year old male with a history of arthritis s/p total knee replacement, GERD, inguinal hiatal hernia repair, renal stones, cirrhosis, pneumonia and asthma (not defined) who presented to [facility] on 8/23 for planned Bakers Cyst removal.    After the case, he went into cardiac arrest. Details recorded in surgical log below describe ST elevation on the monitor, difficulty with ventilation and hypotension, then he became pulseless. He was prone at the time and was turned over, CPR started and he was given Epi x 1. After that, he went into Vfib and was shocked twice. He was kept intubated after the procedure, but self-extubated overnight.   On 08/24, he had some mild chest discomfort and an echo showed an EF of 30-35%. He had a mild elevation in his troponin. Cardiology was asked to evaluate him.  Prior to admission, he was as active as his knee would allow. He was walking daily, working part-time and was able to lift 50 lbs as needed with no difficulty -no CP or dyspnea    Past Medical History:  Diagnosis Date  . Arthritis    knees  . Asthma    as a kid  . Cirrhosis (HCC)   . GERD (gastroesophageal reflux disease)   . History of hiatal hernia   . Kidney stones   . Pneumonia    3 years ago     Past Surgical History:  Procedure Laterality Date  . APPLICATION OF WOUND VAC Right 02/22/2016   Procedure: APPLICATION OF WOUND VAC;  Surgeon: Dannielle Huh, MD;  Location: [facility] OR;  Service: Orthopedics;  Laterality: Right;  . COLONOSCOPY    . CT SCAN of Abdomen  06/27/2012   Cirrhosis with portal hypertension. Marked slenomegaly. Bilateral inguinal hernias. Right inguinal hernia contains part of the  bladder. Report from [facility]  . EAR CYST EXCISION Right 09/06/2014   Procedure: OPEN EXCISION BAKER'S CYST RIGHT KNEE;  Surgeon: Dannielle Huh, MD;  Location: [facility] OR;  Service: Orthopedics;  Laterality: Right;  . ESOPHAGEAL DILATION  1996  . ESOPHAGOGASTRODUODENOSCOPY N/A 09/17/2012   Procedure: ESOPHAGOGASTRODUODENOSCOPY (EGD);  Surgeon: Malissa Hippo, MD;  Location: [facility] ENDO SUITE;  Service: Endoscopy;  Laterality: N/A;  200  . ESOPHAGOGASTRODUODENOSCOPY N/A 12/15/2015   Procedure: ESOPHAGOGASTRODUODENOSCOPY (EGD);  Surgeon: Malissa Hippo, MD;  Location: [facility] ENDO SUITE;  Service: Endoscopy;  Laterality: N/A;  1245  . Exercise tredmill test  03/27/2005   Normal  . HERNIA REPAIR Bilateral 05/22/2013, 11/1997   x 2 (Inguinal hernia) Dr. Derrell Lolling, Dr. Achilles Dunk  . INGUINAL HERNIA REPAIR Bilateral 05/22/2013   Procedure: REPAIR OF RECURRENT INCARCERATED INGUINAL HERNIA WITH MESH RIGHT SIDE,  REPAIR OF RECURRENT INGUINAL HERNIA WITH MESH LEFT SIDE;  Surgeon: Ernestene Mention, MD;  Location: [facility] OR;  Service: General;  Laterality: Bilateral;  . INSERTION OF MESH Bilateral 05/22/2013   Procedure: INSERTION OF MESH;  Surgeon: Ernestene Mention, MD;  Location: [facility] OR;  Service: General;  Laterality: Bilateral;  . IRRIGATION AND DEBRIDEMENT KNEE Right 02/22/2016   Procedure: IRRIGATION AND DEBRIDEMENT KNEE;  Surgeon: Dannielle Huh, MD;  Location: [facility] OR;  Service: Orthopedics;  Laterality: Right;  . Irrigation and  Debridement right knee  03/23/2009   Dr. Hyacinth Meeker, [facility]  . JOINT REPLACEMENT  03/16/2009   RIGHT KNEE REPLACEMENT  . KNEE ARTHROSCOPY WITH EXCISION BAKER'S CYST Right 10/01/14  . Radionuclide Imaging Study     Normal LV systolic function with ejection of 50%; Normal myocardial perfusion without evidence of myocardial ischemia  . REPLACEMENT TOTAL KNEE     Bilateral knee replacement, 2010, 2012  . TONSILLECTOMY      Allergies  Allergen Reactions  . Aspirin Shortness Of Breath  . Penicillins  Shortness Of Breath and Rash    Has patient had a PCN reaction causing immediate rash, facial/tongue/throat swelling, SOB or lightheadedness with hypotension: Yes Has patient had a PCN reaction causing severe rash involving mucus membranes or skin necrosis: No Has patient had a PCN reaction that required hospitalization No Has patient had a PCN reaction occurring within the last 10 years: No If all of the above answers are "NO", then may proceed with Cephalosporin use.   . Vancomycin Anaphylaxis    Immediately following Vancomycin administration in the OR patient Vfib arrested.    I have reviewed the patient's current medications . antiseptic oral rinse  7 mL Mouth Rinse BID  . heparin  5,000 Units Subcutaneous Q8H  . insulin aspart  2-6 Units Subcutaneous Q4H  . lactulose  20 g Oral BID  . pantoprazole  40 mg Oral Daily  . sodium chloride flush  10-40 mL Intracatheter Q12H   . lactated ringers 50 mL/hr at 02/23/16 1400  . phenylephrine (NEO-SYNEPHRINE) Adult infusion Stopped (02/23/16 0745)   sodium chloride, fentaNYL (SUBLIMAZE) injection, sodium chloride flush  Prior to Admission medications   Medication Sig Start Date End Date Taking? Authorizing Provider  pantoprazole (PROTONIX) 40 MG tablet Take 1 tablet (40 mg total) by mouth daily. 12/13/15  Yes Malissa Hippo, MD  ciprofloxacin (CIPRO) 500 MG tablet Take 1 tablet (500 mg total) by mouth 2 (two) times daily. Patient not taking: Reported on 02/20/2016 02/05/16   Burgess Amor, PA-C  HYDROcodone-acetaminophen (NORCO/VICODIN) 5-325 MG tablet Take 1 tablet by mouth every 4 (four) hours as needed. Patient not taking: Reported on 02/20/2016 02/05/16   Burgess Amor, PA-C  lactulose (CHRONULAC) 10 GM/15ML solution Take 30 mLs (20 g total) by mouth 2 (two) times daily. Patient not taking: Reported on 02/20/2016 12/15/15   Malissa Hippo, MD     Social History   Social History  . Marital status: Single    Spouse name: N/A  . Number of  children: 2  . Years of education: N/A   Occupational History  . Tour manager   Social History Main Topics  . Smoking status: Former Smoker    Packs/day: 0.25    Years: 3.00    Types: Cigarettes    Quit date: 07/03/1975  . Smokeless tobacco: Never Used     Comment: none since age 73-1 pack per week when he was smoking   . Alcohol use No  . Drug use: No  . Sexual activity: Not on file   Other Topics Concern  . Not on file   Social History Narrative   Pt lives alone.    Family Status  Relation Status  . Mother Alive   good health  . Father Deceased   Cirrhosis of liver, non-alcoholic  . Sister Alive   good health  . Brother Alive   DM2  . Paternal Uncle Deceased   complication of Diabetes  . Maternal Grandmother  Deceased   died in her 56's from MI  . Maternal Grandfather Deceased   age of death: in his 73's. cause of death: MI  . Neg Hx    Family History  Problem Relation Age of Onset  . Diabetes Mother     type 2, born 73  . Arthritis Father     died age 25  . GI Bleed Father     upper GI Bleed, non-ETOH cirrhosis  . Arthritis Brother   . Diabetes Brother     type 2  . Diabetes Paternal Uncle     type 2  . Heart attack Maternal Grandmother   . Heart attack Maternal Grandfather   . Colon cancer Neg Hx   . Colon polyps Neg Hx      ROS:  Full 14 point review of systems complete and found to be negative unless listed above.  Physical Exam: Blood pressure 103/67, pulse 67, temperature 98.2 F (36.8 C), temperature source Oral, resp. rate (!) 23, height 6' (1.829 m), weight 226 lb 6.6 oz (102.7 kg), SpO2 96 %.  General: Well developed, well nourished, male in no acute distress Head: Eyes PERRLA, No xanthomas.   Normocephalic and atraumatic, oropharynx without edema or exudate. Dentition: good Lungs: clear bilaterally Heart: HRRR S1 S2, no rub/gallop, no murmur. pulses are 2+ all 4 extrem.   Neck: No carotid bruits. No lymphadenopathy.  JVD  not elevated Abdomen: Bowel sounds present, abdomen soft and non-tender without masses or hernias noted. Msk:  No spine or cva tenderness. No weakness, no joint deformities or effusions. Extremities: No clubbing or cyanosis. No edema. Knee wound with wound vac in place, not disturbed Neuro: Alert and oriented X 3. No focal deficits noted. Psych:  Good affect, responds appropriately Skin: No rashes or lesions noted.  Labs:   Lab Results  Component Value Date   WBC 9.1 02/23/2016   HGB 14.4 02/23/2016   HCT 43.1 02/23/2016   MCV 89.0 02/23/2016   PLT 132 (L) 02/23/2016    Recent Labs  02/22/16 1430  INR 1.21     Recent Labs Lab 02/22/16 1430 02/23/16 0924  NA 138 140  K 3.5 3.7  CL 108 108  CO2 21* 23  BUN 14 14  CREATININE 1.26* 1.19  CALCIUM 8.0* 8.0*  PROT 5.6*  --   BILITOT 1.5*  --   ALKPHOS 116  --   ALT 138*  --   AST 169*  --   GLUCOSE 137* 127*  ALBUMIN 3.5 3.0*   Magnesium  Date Value Ref Range Status  02/23/2016 2.1 1.7 - 2.4 mg/dL Final    Recent Labs  62/95/28 1250 02/22/16 1430 02/22/16 1940 02/23/16 0230  TROPONINI <0.03 0.38* 1.85* 1.13*   B Natriuretic Peptide  Date/Time Value Ref Range Status  02/22/2016 02:02 PM 32.2 0.0 - 100.0 pg/mL Final   Lab Results  Component Value Date   TRIG 111 02/23/2016   Echo: 08/24 - Left ventricle: The cavity size was normal. Wall thickness was   normal. Systolic function was moderately to severely reduced. The   estimated ejection fraction was in the range of 30% to 35%.   Severe hypokinesis of the mid-apicalanteroseptal and apical   myocardium. Doppler parameters are consistent with abnormal left   ventricular relaxation (grade 1 diastolic dysfunction).  ECG:  08/23 ST, rate 115 QT/QTc 340/470 ms  Anesthesia Report post-op: 8/24 1223 Quick Note     EKG changes noted on monitor. ST elevation.  Peak pressure elevated and difficulty delivering adequate tidal volume. Tube checked for kinks.  Breath sounds present bilateral, diminished, wheezes on left. Requested Dr. Desmond Lope be called to the room.    1226 Quick Note    MDA called to room for hypotension.   1226 Anesthesia MD in room    MDA called to room for hypotension unresponsive to ephedrine. On arrival to OR, patient in prone position, hypotensive with EtCO2 in teens. Bilateral breath sounds to auscultation, but breath sounds diminished. Communicated with surgeon to return patient to supine position. of epinephrine IV administered. Vancomycin infusion discontinued. Patient returned to supine position. Pulse checked and patient found to be pulseless. 1mg  epinephrine delivered IV and CPR initiated. 2nd IV and arterial line obtained. Patient progressed into V-fib and defibrillated x1 with return of spontaneous circulation and sinus rhythm.   Transported to ICU intubated, sedated and VSS. Report given to ICU staff.   Rondall Allegra, MD   [phone] Quick Note    CPR in progress   1231 CPR   1232 CPR   1235 Quick Note    Defib shock 120 joules    1235 Defibrillation    Defib shock 120 Joules    1236 Restoration of Circulation    Sinus Huston Foley with a pulse      Radiology:  Dg Chest Port 1 View Result Date: 02/22/2016 CLINICAL DATA:  Status post central line placement and intubation today. EXAM: PORTABLE CHEST 1 VIEW COMPARISON:  PA and lateral chest 07/29/2012. FINDINGS: Endotracheal tube is in place with the tip at the level of clavicular heads well above the carina. Right subclavian catheter tip projects over the mid to lower superior vena cava. Defibrillator pad is in place over the left chest. Lung volumes are low but the lungs are clear. No pneumothorax or pleural fluid. Heart size is normal. IMPRESSION: ETT and right subclavian catheter projecting good position. Negative for pneumothorax. No acute disease. Electronically Signed   By: Drusilla Kanner M.D.   On: 02/22/2016 16:40    ASSESSMENT AND PLAN:   The patient was seen  today by Dr Elease Hashimoto, the patient evaluated and the data reviewed.  Active Problems:   Cardiac arrest (HCC) - with low EF, cath am - unclear if ischemic or non-ischemic - hold off on statin w/ abnl LFTs, BP has been low, but may be able to add low-dose Coreg and/or ACE prior to dc - no ASA, it gives him asthma - The risks and benefits of a cardiac catheterization including, but not limited to, death, stroke, MI, kidney damage and bleeding were discussed with the patient who indicates understanding and agrees to proceed.     Encounter for central line placement - per CCM    Shock circulatory (HCC) - was on neo all night, now off.  SignedLeanna Battles 02/23/2016 2:59 PM Beeper [phone]  Co-Sign MD   Attending Note:   The patient was seen and examined.  Agree with assessment and plan as noted above.  Changes made to the above note as needed.  Patient seen and independently examined with Theodore Demark, PA .   We discussed all aspects of the encounter. I agree with the assessment and plan as stated above.  Pt presented for Bakers cyst surgery . Surgery was complicated by respiratory arrest / cardiac arrest Apparently had some ST elevation during surgery  - no ECG or tele strips are available  Had CPR, received Epi - then at that point had VF  arrest Was recessutated  Echo now reveals EF of 35%  Was hypotensive this am - required pressors this am  Doing better.   BP is better  1. Cardiopulmonary arrest - cause is unclear. Has been found to have EF 35%  Will arrange for cath tomorrow Has an ASA allergy.   Will wait and start plavix once we see that the will not need CABG   2. Acute vs. Chronic systolic chf:   Will start low dose coreg  . BP is still marginal today  Anticipate adding ARB soon He will need follow up for this CHF as op    I have spent a total of 40 minutes with patient reviewing hospital  notes , telemetry, EKGs, labs and examining patient as well as  establishing an assessment and plan that was discussed with the patient. > 50% of time was spent in direct patient care.    Vesta Mixer, Montez Hageman., MD, [facility] 02/23/2016, 3:36 PM [address],  [address] Office [phone] Pager [phone]

## 2016-02-24 NOTE — Interval H&P Note (Signed)
History and Physical Interval Note:  02/24/2016 11:08 AM  Stephen Burch  has presented today for cardiac cath with the diagnosis of NSTEMI  The various methods of treatment have been discussed with the patient and family. After consideration of risks, benefits and other options for treatment, the patient has consented to  Procedure(s): Left Heart Cath and Coronary Angiography (N/A) as a surgical intervention .  The patient's history has been reviewed, patient examined, no change in status, stable for surgery.  I have reviewed the patient's chart and labs.  Questions were answered to the patient's satisfaction.    Cath Lab Visit (complete for each Cath Lab visit)  Clinical Evaluation Leading to the Procedure:   ACS: Yes.    Non-ACS:    Anginal Classification: CCS III  Anti-ischemic medical therapy: No Therapy  Non-Invasive Test Results: No non-invasive testing performed  Prior CABG: No previous CABG         Lauree Chandler

## 2016-02-24 NOTE — Care Management Note (Signed)
Case Management Note Marvetta Gibbons RN, BSN Unit 2W-Case Manager [phone]  Patient Details  Name: Stephen Burch MRN: [id number] Date of Birth: 1956-01-03  Subjective/Objective:    Pt admitted s/p  Right open irrigation and debridement with VAC sponge Placement.- with cardiac arrest post precedure               Action/Plan: PTA pt lived at home-was active with [facility] for [facility] services- will need resumption orders for discharge- also will pt go home with wound VAC? Will need order form signed prior to discharge in order to arrange- please place CM consult for wound VAC if needed. ([facility] or [facility] wound VAC? Please advise)  Expected Discharge Date:                  Expected Discharge Plan:  [facility]  In-House Referral:     Discharge planning Services     Post Acute Care Choice:  Resumption of Svcs/PTA Provider Choice offered to:     DME Arranged:    DME Agency:     HH Arranged:  RN [place] Agency:  [facility] (now [facility])  Status of Service:     If discussed at [employer], dates discussed:    Additional Comments:  Dawayne Patricia, RN 02/24/2016, 2:08 PM

## 2016-02-24 NOTE — Progress Notes (Signed)
Pt's discharge instructions have been given and is now hooked up to home wound vac.  IVs have been taken out. Lupita Dawn, RN

## 2016-02-27 MED FILL — Verapamil HCl IV Soln 2.5 MG/ML: INTRAVENOUS | Qty: 2 | Status: AC

## 2016-03-04 ENCOUNTER — Encounter (HOSPITAL_COMMUNITY): Payer: Self-pay | Admitting: *Deleted

## 2016-03-04 ENCOUNTER — Emergency Department (HOSPITAL_COMMUNITY): Payer: Medicare Other

## 2016-03-04 ENCOUNTER — Emergency Department (HOSPITAL_COMMUNITY)
Admission: EM | Admit: 2016-03-04 | Discharge: 2016-03-04 | Disposition: A | Discharge status: Home / Self Care | Payer: Medicare Other | Attending: Emergency Medicine | Admitting: Emergency Medicine

## 2016-03-04 DIAGNOSIS — J45909 Unspecified asthma, uncomplicated: Secondary | ICD-10-CM | POA: Diagnosis not present

## 2016-03-04 DIAGNOSIS — R071 Chest pain on breathing: Secondary | ICD-10-CM | POA: Insufficient documentation

## 2016-03-04 DIAGNOSIS — Z87891 Personal history of nicotine dependence: Secondary | ICD-10-CM | POA: Diagnosis not present

## 2016-03-04 DIAGNOSIS — R091 Pleurisy: Secondary | ICD-10-CM | POA: Diagnosis present

## 2016-03-04 HISTORY — DX: Personal history of sudden cardiac arrest: Z86.74

## 2016-03-04 LAB — BASIC METABOLIC PANEL
Anion gap: 8 (ref 5–15)
BUN: 11 mg/dL (ref 6–20)
CHLORIDE: 99 mmol/L — AB (ref 101–111)
CO2: 27 mmol/L (ref 22–32)
Calcium: 8.6 mg/dL — ABNORMAL LOW (ref 8.9–10.3)
Creatinine, Ser: 0.95 mg/dL (ref 0.61–1.24)
Glucose, Bld: 159 mg/dL — ABNORMAL HIGH (ref 65–99)
POTASSIUM: 3.3 mmol/L — AB (ref 3.5–5.1)
SODIUM: 134 mmol/L — AB (ref 135–145)

## 2016-03-04 LAB — CBC
HCT: 43.7 % (ref 39.0–52.0)
Hemoglobin: 15.5 g/dL (ref 13.0–17.0)
MCH: 30.9 pg (ref 26.0–34.0)
MCHC: 35.5 g/dL (ref 30.0–36.0)
MCV: 87.2 fL (ref 78.0–100.0)
Platelets: 105 10*3/uL — ABNORMAL LOW (ref 150–400)
RBC: 5.01 MIL/uL (ref 4.22–5.81)
RDW: 13.2 % (ref 11.5–15.5)
WBC: 4.9 10*3/uL (ref 4.0–10.5)

## 2016-03-04 LAB — I-STAT TROPONIN, ED: Troponin i, poc: 0 ng/mL (ref 0.00–0.08)

## 2016-03-04 MED ORDER — IBUPROFEN 400 MG PO TABS: 400.0000 mg | ORAL_TABLET | Freq: Four times a day (QID) | ORAL | 0 refills | Status: DC

## 2016-03-04 MED ORDER — KETOROLAC TROMETHAMINE 60 MG/2ML IM SOLN: 20.0000 mg | Freq: Once | INTRAMUSCULAR | Status: DC

## 2016-03-04 MED ORDER — KETOROLAC TROMETHAMINE 30 MG/ML IJ SOLN
INTRAMUSCULAR | Status: AC
  Filled 2016-03-04: qty 1

## 2016-03-04 MED ORDER — HYDROCODONE-ACETAMINOPHEN 5-325 MG PO TABS: 1.0000 | ORAL_TABLET | Freq: Four times a day (QID) | ORAL | 0 refills | Status: DC | PRN

## 2016-03-04 MED ORDER — HYDROCODONE-ACETAMINOPHEN 5-325 MG PO TABS
1.0000 | ORAL_TABLET | Freq: Once | ORAL | Status: AC
  Administered 2016-03-04: 1 via ORAL
  Filled 2016-03-04: qty 1

## 2016-03-04 MED ORDER — KETOROLAC TROMETHAMINE 30 MG/ML IJ SOLN
10.0000 mg | Freq: Once | INTRAMUSCULAR | Status: AC
  Administered 2016-03-04: 9.9 mg via INTRAVENOUS
  Filled 2016-03-04: qty 1

## 2016-03-04 NOTE — ED Triage Notes (Signed)
Pt states that he went in to hospital for knee surgery and went into cardiac arrest, had cpr performed, advised that everything "checked out" on his heart, comes to er tonight c/o left side chest pain that is worse with cough, movement,

## 2016-03-04 NOTE — ED Provider Notes (Signed)
[facility] DEPT Provider Note   CSN: [id number] Arrival date & time: 03/04/16  0151     History   Chief Complaint Chief Complaint  Patient presents with  . Pleurisy    HPI Stephen Burch is a 60 y.o. male.  60 year old male who was discharged from hospital week ago after having had a cardiac arrest secondary to ST elevation in the A. fib while under general anesthesia for Baker's cyst. Had CPR performed for unknown period of time and also defibrillated couple times. He has intermittent chest pain worse with big deep breaths or with coughing. He is coughing up white phlegm. This is been persistent since his discharge. Is not radiating or. Has no shortness of breath, nausea, vomiting, headache, dizziness, diaphoresis or other associated symptoms. No modifying factors aside from coughing and deep breaths make it worse.      Past Medical History:  Diagnosis Date  . Arthritis    knees  . Asthma    as a kid  . Cirrhosis ([place])   . GERD (gastroesophageal reflux disease)   . History of hiatal hernia   . Hx of cardiac arrest   . Kidney stones   . Pneumonia    3 years ago  . Thrombocytopenia [facility])     Patient Active Problem List   Diagnosis Date Noted  . Thrombocytopenia ([place])   . Hypomagnesemia   . Cardiac arrest ([place]) 02/22/2016  . Encounter for central line placement   . Shock circulatory ([place])   . Vitiligo 08/22/2015  . AD (atopic dermatitis) 08/22/2015  . Osteoarthritis of right knee 08/22/2015  . Hepatic cirrhosis ([place]) 08/22/2015  . Hyperglycemia 08/22/2015  . GERD (gastroesophageal reflux disease) 08/22/2015  . Ganglion of left wrist 08/22/2015  . ED (erectile dysfunction) of organic origin 08/22/2015  . Asthma 08/22/2015  . Dysplastic nodule of liver 08/22/2015  . Baker cyst 09/06/2014  . H/O total knee replacement 08/05/2014  . Bilateral inguinal hernia 08/01/2012  . Splenomegaly 08/01/2012  . Portal hypertension ([place]) 08/01/2012  . Gallstones  08/01/2012    Past Surgical History:  Procedure Laterality Date  . APPLICATION OF WOUND VAC Right 02/22/2016   Procedure: APPLICATION OF WOUND VAC;  Surgeon: Vickey Huger, MD;  Location: [place];  Service: Orthopedics;  Laterality: Right;  . CARDIAC CATHETERIZATION N/A 02/24/2016   Procedure: Left Heart Cath and Coronary Angiography;  Surgeon: Burnell Blanks, MD;  Location: [place] [facility];  Service: Cardiovascular;  Laterality: N/A;  . COLONOSCOPY    . CT SCAN of Abdomen  06/27/2012   Cirrhosis with portal hypertension. Marked slenomegaly. Bilateral inguinal hernias. Right inguinal hernia contains part of the bladder. Report from [place] Right 09/06/2014   Procedure: OPEN EXCISION BAKER'S CYST RIGHT KNEE;  Surgeon: Vickey Huger, MD;  Location: [place];  Service: Orthopedics;  Laterality: Right;  . [place]  . ESOPHAGOGASTRODUODENOSCOPY N/A 09/17/2012   Procedure: ESOPHAGOGASTRODUODENOSCOPY (EGD);  Surgeon: Rogene Houston, MD;  [facility];  Service: Endoscopy;  Laterality: N/A;  200  . ESOPHAGOGASTRODUODENOSCOPY N/A 12/15/2015   Procedure: ESOPHAGOGASTRODUODENOSCOPY (EGD);  Surgeon: Rogene Houston, MD;  [facility];  Service: Endoscopy;  Laterality: N/A;  1245  . Exercise tredmill test  03/27/2005   Normal  . HERNIA REPAIR Bilateral 05/22/2013, 11/1997   x 2 (Inguinal hernia) Dr. Dalbert Batman, Dr. Jacqlyn Larsen  . INGUINAL HERNIA REPAIR Bilateral 05/22/2013   Procedure: REPAIR OF RECURRENT INCARCERATED INGUINAL HERNIA  WITH MESH RIGHT SIDE,  REPAIR OF RECURRENT INGUINAL HERNIA WITH MESH LEFT SIDE;  Surgeon: [name], MD;  Location: [place];  Service: General;  Laterality: Bilateral;  . INSERTION OF MESH Bilateral 05/22/2013   Procedure: INSERTION OF MESH;  Surgeon: [name], MD;  Location: [place];  Service: General;  Laterality: Bilateral;  . IRRIGATION AND DEBRIDEMENT KNEE Right 02/22/2016   Procedure: IRRIGATION AND  DEBRIDEMENT KNEE;  Surgeon: Vickey Huger, MD;  Location: [place];  Service: Orthopedics;  Laterality: Right;  . Irrigation and Debridement right knee  03/23/2009   Dr. Sabra Heck, [facility]  . JOINT REPLACEMENT  03/16/2009   RIGHT KNEE REPLACEMENT  . KNEE ARTHROSCOPY WITH EXCISION BAKER'S CYST Right 10/01/14  . Radionuclide Imaging Study     Normal LV systolic function with ejection of 50%; Normal myocardial perfusion without evidence of myocardial ischemia  . REPLACEMENT TOTAL KNEE     Bilateral knee replacement, 2010, 2012  . TONSILLECTOMY         Home Medications    Prior to Admission medications   Medication Sig Start Date End Date Taking? Authorizing Provider  carvedilol (COREG) 3.125 MG tablet Take 1 tablet (3.125 mg total) by mouth 2 (two) times daily with a meal. 02/24/16   Donia Ast, PA  HYDROcodone-acetaminophen (NORCO) 5-325 MG tablet Take 1 tablet by mouth every 6 (six) hours as needed for severe pain. 03/04/16   Merrily Pew, MD  ibuprofen (ADVIL,MOTRIN) 400 MG tablet Take 1 tablet (400 mg total) by mouth 4 (four) times daily. 03/04/16   Merrily Pew, MD  lactulose (CHRONULAC) 10 GM/15ML solution Take 30 mLs (20 g total) by mouth 2 (two) times daily. Patient not taking: Reported on 02/20/2016 12/15/15   Rogene Houston, MD  pantoprazole (PROTONIX) 40 MG tablet Take 1 tablet (40 mg total) by mouth daily. 12/13/15   Rogene Houston, MD    Family History Family History  Problem Relation Age of Onset  . Diabetes Mother     type 2, born 17  . Arthritis Father     died age 25  . GI Bleed Father     upper GI Bleed, non-ETOH cirrhosis  . Arthritis Brother   . Diabetes Brother     type 2  . Diabetes Paternal Uncle     type 2  . Heart attack Maternal Grandmother   . Heart attack Maternal Grandfather   . Colon cancer Neg Hx   . Colon polyps Neg Hx     Social History Social History  Substance Use Topics  . Smoking status: Former Smoker    Packs/day: 0.25    Years: 3.00     Types: Cigarettes    Quit date: 07/03/1975  . Smokeless tobacco: Never Used     Comment: none since age 44-1 pack per week when he was smoking   . Alcohol use No     Allergies   Aspirin; Penicillins; and Vancomycin   Review of Systems Review of Systems  Respiratory: Positive for cough. Negative for shortness of breath, wheezing and stridor.   Cardiovascular: Positive for chest pain. Negative for leg swelling.  Gastrointestinal: Negative for abdominal pain.  All other systems reviewed and are negative.    Physical Exam Updated Vital Signs BP 104/75   Pulse 69   Temp 97.8 F (36.6 C) (Oral)   Resp 10   Ht 6' (1.829 m)   Wt 213 lb (96.6 kg)   SpO2 95%   BMI  28.89 kg/m   Physical Exam  Constitutional: He is oriented to person, place, and time. He appears well-developed and well-nourished.  HENT:  Head: Normocephalic and atraumatic.  Eyes: Conjunctivae are normal.  Neck: Neck supple.  Cardiovascular: Normal rate and regular rhythm.   No murmur heard. Pulmonary/Chest: Effort normal and breath sounds normal. No respiratory distress. He has no wheezes. He has no rales. He exhibits tenderness (when palpatign sternum).  Abdominal: Soft. He exhibits no mass. There is no tenderness.  Musculoskeletal: He exhibits no edema.  Neurological: He is alert and oriented to person, place, and time.  Skin: Skin is warm and dry.  Psychiatric: He has a normal mood and affect.  Nursing note and vitals reviewed.    ED Treatments / Results  Labs (all labs ordered are listed, but only abnormal results are displayed) Labs Reviewed  BASIC METABOLIC PANEL - Abnormal; Notable for the following:       Result Value   Sodium 134 (*)    Potassium 3.3 (*)    Chloride 99 (*)    Glucose, Bld 159 (*)    Calcium 8.6 (*)    All other components within normal limits  CBC - Abnormal; Notable for the following:    Platelets 105 (*)    All other components within normal limits  I-STAT TROPOININ,  ED    EKG  EKG Interpretation  Date/Time:  Sunday March 04 2016 02:00:13 EDT Ventricular Rate:  75 PR Interval:    QRS Duration: 104 QT Interval:  389 QTC Calculation: 435 R Axis:   -10 Text Interpretation:  Sinus rhythm Low voltage, precordial leads Borderline T abnormalities, inferior leads Confirmed by [facility] MD, Corene Cornea [phone]) on 03/04/2016 2:09:46 AM       Radiology Dg Chest 2 View  Result Date: 03/04/2016 CLINICAL DATA:  Status post cardiac arrest and CPR. Left-sided chest pain and cough. Initial encounter. EXAM: CHEST  2 VIEW COMPARISON:  Chest radiograph performed 02/22/2016 FINDINGS: The lungs are well-aerated and clear. There is no evidence of focal opacification, pleural effusion or pneumothorax. The heart is normal in size; the mediastinal contour is within normal limits. No acute osseous abnormalities are seen. Anterior bridging osteophytes are seen along the lower thoracic spine. IMPRESSION: No acute cardiopulmonary process seen. Electronically Signed   By: Garald Balding M.D.   On: 03/04/2016 03:18   Dg Sternum  Result Date: 03/04/2016 CLINICAL DATA:  Status post cardiac arrest and CPR, with left-sided chest pain. Initial encounter. EXAM: STERNUM - 2+ VIEW COMPARISON:  Chest radiograph performed 07/29/2012 FINDINGS: There is question of a minimally displaced fracture of the body of the sternum. The visualized portions of the lungs are grossly clear. IMPRESSION: Question of minimally displaced fracture of the body of the sternum. Electronically Signed   By: Garald Balding M.D.   On: 03/04/2016 03:19   Ct Chest Wo Contrast  Result Date: 03/04/2016 CLINICAL DATA:  Status post cardiac arrest and CPR. Acute onset of left-sided chest pain and cough. Initial encounter. EXAM: CT CHEST WITHOUT CONTRAST TECHNIQUE: Multidetector CT imaging of the chest was performed following the standard protocol without IV contrast. COMPARISON:  None. FINDINGS: Cardiovascular: The heart is difficult  to fully assess without contrast, but appears grossly unremarkable. The thoracic aorta is grossly unremarkable in appearance. The great vessels are grossly unremarkable. Mediastinum/Nodes: The mediastinum is unremarkable in appearance. No mediastinal lymphadenopathy is seen. No pericardial effusion is identified. The thyroid gland is unremarkable. No axillary lymphadenopathy is seen. Lungs/Pleura: The  lungs are clear bilaterally. No focal consolidation, pleural effusion or pneumothorax is seen. No masses are identified. Upper Abdomen: The visualized portions of the liver are unremarkable. A few tiny stones are noted at the base of the gallbladder. The spleen is enlarged, measuring 18.2 cm in length. The visualized portions of the pancreas and adrenal glands are within normal limits. Musculoskeletal: No acute osseous abnormalities are identified. The chest wall is grossly unremarkable in appearance. Anterior bridging osteophytes are noted along the lower thoracic spine. IMPRESSION: 1. No acute abnormality seen within the chest. 2. Minimal cholelithiasis noted. Gallbladder otherwise grossly unremarkable. 3. Splenomegaly noted. Electronically Signed   By: Garald Balding M.D.   On: 03/04/2016 04:15    Procedures Procedures (including critical care time)  Medications Ordered in ED Medications  HYDROcodone-acetaminophen (NORCO/VICODIN) 5-325 MG per tablet 1 tablet (1 tablet Oral Given 03/04/16 0236)  ketorolac (TORADOL) 30 MG/ML injection 9.9 mg (9.9 mg Intravenous Given 03/04/16 0235)     Initial Impression / Assessment and Plan / ED Course  I have reviewed the triage vital signs and the nursing notes.  Pertinent labs & imaging results that were available during my care of the patient were reviewed by me and considered in my medical decision making (see chart for details).  Clinical Course   Likely MSK from broken ribs/costochondritis from cardiac arrest and chest compressions. ecg ok, doubt acs. Will xr  and ct if needed. Supportive care otherwise.  No fracture seen. Will treat for costochondritis likely 2/2 trauma. Doubt PE/ACS/pneumonia.   Final Clinical Impressions(s) / ED Diagnoses   Final diagnoses:  Chest pain on breathing    New Prescriptions Discharge Medication List as of 03/04/2016  4:22 AM    START taking these medications   Details  ibuprofen (ADVIL,MOTRIN) 400 MG tablet Take 1 tablet (400 mg total) by mouth 4 (four) times daily., Starting Sun 03/04/2016, Print         Merrily Pew, MD 03/04/16 [phone]

## 2016-03-13 ENCOUNTER — Ambulatory Visit (INDEPENDENT_AMBULATORY_CARE_PROVIDER_SITE_OTHER): Payer: Medicare Other | Admitting: Internal Medicine

## 2016-03-13 ENCOUNTER — Encounter (INDEPENDENT_AMBULATORY_CARE_PROVIDER_SITE_OTHER): Payer: Self-pay | Admitting: Internal Medicine

## 2016-03-13 VITALS — BP 102/78 | HR 64 | Temp 97.9°F | Resp 18 | Ht 72.0 in | Wt 213.1 lb

## 2016-03-13 DIAGNOSIS — R740 Nonspecific elevation of levels of transaminase and lactic acid dehydrogenase [LDH]: Secondary | ICD-10-CM | POA: Diagnosis not present

## 2016-03-13 DIAGNOSIS — R1010 Upper abdominal pain, unspecified: Secondary | ICD-10-CM | POA: Diagnosis not present

## 2016-03-13 DIAGNOSIS — R748 Abnormal levels of other serum enzymes: Secondary | ICD-10-CM

## 2016-03-13 DIAGNOSIS — K7460 Unspecified cirrhosis of liver: Secondary | ICD-10-CM

## 2016-03-13 NOTE — Progress Notes (Signed)
Presenting complaint;  Right-sided abdominal pain and elevated transaminases.  Database and Subjective:  Patient is 60 year old Caucasian male who was cirrhosis secondary to NASH and was doing well when he was last seen on 12/13/2015. He underwent right knee surgery for Baker's cyst on 02/22/2016. Towards end of the procedure he had cardiac arrest and had to be recessed sedated. He was shocked twice. Echo in usually after his event revealed diminished ejection fraction but cardiac cath on 02/24/2016 revealed normal coronaries and normal LV function. It was determined that he had life-threatening reaction to vancomycin. He was also noted to have elevated transaminases and has been experiencing pain below the right costal margin. He was therefore advised to follow-up with me. He has noted mild pain below the right costal margin laterally ever since he left the hospital. This pain is not associated with nausea vomiting or hematuria. Pain does not radiate posteriorly. Similarly he is not noted any change in pain with meals or with physical activity. He remains with good appetite. He has gained 5 pounds. He states since his surgery he has not been able to exercise. He is on pain medication which she is taking very sparingly and not every day. He has 1 formed stool daily. He denies rectal bleeding or melena.    Current Medications: Outpatient Encounter Prescriptions as of 03/13/2016  Medication Sig  . HYDROcodone-acetaminophen (NORCO) 5-325 MG tablet Take 1 tablet by mouth every 6 (six) hours as needed for severe pain.  . pantoprazole (PROTONIX) 40 MG tablet Take 1 tablet (40 mg total) by mouth daily.  . [DISCONTINUED] carvedilol (COREG) 3.125 MG tablet Take 1 tablet (3.125 mg total) by mouth 2 (two) times daily with a meal. (Patient not taking: Reported on 03/13/2016)  . [DISCONTINUED] ibuprofen (ADVIL,MOTRIN) 400 MG tablet Take 1 tablet (400 mg total) by mouth 4 (four) times daily. (Patient not taking:  Reported on 03/13/2016)  . [DISCONTINUED] lactulose (CHRONULAC) 10 GM/15ML solution Take 30 mLs (20 g total) by mouth 2 (two) times daily. (Patient not taking: Reported on 02/20/2016)   No facility-administered encounter medications on file as of 03/13/2016.      Objective: Blood pressure 102/78, pulse 64, temperature 97.9 F (36.6 C), temperature source Oral, resp. rate 18, height 6' (1.829 m), weight 213 lb 1.6 oz (96.7 kg). Patient is alert and in no acute distress. Asterixis absent. Conjunctiva is pink. Sclera is nonicteric Oropharyngeal mucosa is normal. No neck masses or thyromegaly noted. Cardiac exam with regular rhythm normal S1 and S2. No murmur or gallop noted. Lungs are clear to auscultation. Abdomen is full and symmetrical. On palpation it soft and nontender. Liver edge is indistinct but spleen tip was palpable. He has bilateral scars above inguinal ligament from prior hernia repaired but no evidence of cough impulse. Small umbilicus scar also noted. No LE edema or clubbing noted. He has vitiligo involving back of his hands.  Labs/studies Results:  AST and ALT were 32 and 35 on 02/21/2016  AST and ALT were 169 and 138 respectively on 02/22/2016  AST and ALT were 66 and 78 respectively on 02/24/2016.   Assessment:  #1. Right upper quadrant abdominal pain appears to be musculoskeletal. No further workup unless pain lingers for more than 3 weeks or becomes worse. #2. Elevated transaminases possibly related to cardiac arrest three weeks ago resulting in liver injury. Prior to this incidence transaminases have been normal for several months. Transaminases should return to normal otherwise further workup would be undertaken. #3. Cirrhosis secondary  to NASH. He has preserved hepatic function. His transaminases have been normal until recently. He is up-to-date re [place] screening. #4. GERD complicated by short segment Barrett's esophagus. He is doing well with anti-reflux measures and  PPI.   Plan:  Patient will have LFTs next week. He will call if abdominal pain becomes worsens or does not resolve in the next 3-4 weeks. Office visit in December, 2017.

## 2016-03-13 NOTE — Patient Instructions (Signed)
Physician will call with results of LFTs when completed. Notify if abdominal pain becomes worse and are not resolved in the next 3-4 weeks.

## 2016-03-27 LAB — HEPATIC FUNCTION PANEL
ALBUMIN: 4.1 g/dL (ref 3.6–5.1)
ALK PHOS: 107 U/L (ref 40–115)
ALT: 30 U/L (ref 9–46)
AST: 29 U/L (ref 10–35)
BILIRUBIN DIRECT: 0.2 mg/dL (ref <=0.2)
BILIRUBIN TOTAL: 1 mg/dL (ref 0.2–1.2)
Indirect Bilirubin: 0.8 mg/dL (ref 0.2–1.2)
Total Protein: 6.4 g/dL (ref 6.1–8.1)

## 2016-04-01 ENCOUNTER — Emergency Department (HOSPITAL_COMMUNITY)
Admission: EM | Admit: 2016-04-01 | Discharge: 2016-04-01 | Disposition: A | Discharge status: Home / Self Care | Payer: Medicare Other | Source: Home / Self Care | Attending: Emergency Medicine | Admitting: Emergency Medicine

## 2016-04-01 ENCOUNTER — Emergency Department (HOSPITAL_COMMUNITY): Payer: Medicare Other

## 2016-04-01 ENCOUNTER — Inpatient Hospital Stay (HOSPITAL_COMMUNITY)
Admission: EM | Admit: 2016-04-01 | Discharge: 2016-04-07 | DRG: 463 | Disposition: A | Discharge status: Home Health | Payer: Medicare Other | Attending: Orthopedic Surgery | Admitting: Orthopedic Surgery

## 2016-04-01 ENCOUNTER — Encounter (HOSPITAL_COMMUNITY): Payer: Self-pay | Admitting: Emergency Medicine

## 2016-04-01 DIAGNOSIS — M7121 Synovial cyst of popliteal space [Baker], right knee: Secondary | ICD-10-CM | POA: Insufficient documentation

## 2016-04-01 DIAGNOSIS — M25561 Pain in right knee: Secondary | ICD-10-CM | POA: Diagnosis not present

## 2016-04-01 DIAGNOSIS — J45909 Unspecified asthma, uncomplicated: Secondary | ICD-10-CM | POA: Insufficient documentation

## 2016-04-01 DIAGNOSIS — K7581 Nonalcoholic steatohepatitis (NASH): Secondary | ICD-10-CM

## 2016-04-01 DIAGNOSIS — Z8674 Personal history of sudden cardiac arrest: Secondary | ICD-10-CM

## 2016-04-01 DIAGNOSIS — K7460 Unspecified cirrhosis of liver: Secondary | ICD-10-CM | POA: Diagnosis present

## 2016-04-01 DIAGNOSIS — Z87891 Personal history of nicotine dependence: Secondary | ICD-10-CM

## 2016-04-01 DIAGNOSIS — Z79899 Other long term (current) drug therapy: Secondary | ICD-10-CM | POA: Insufficient documentation

## 2016-04-01 DIAGNOSIS — R2681 Unsteadiness on feet: Secondary | ICD-10-CM

## 2016-04-01 DIAGNOSIS — A4101 Sepsis due to Methicillin susceptible Staphylococcus aureus: Secondary | ICD-10-CM | POA: Diagnosis present

## 2016-04-01 DIAGNOSIS — M25569 Pain in unspecified knee: Secondary | ICD-10-CM

## 2016-04-01 DIAGNOSIS — Z880 Allergy status to penicillin: Secondary | ICD-10-CM

## 2016-04-01 DIAGNOSIS — M00062 Staphylococcal arthritis, left knee: Secondary | ICD-10-CM | POA: Diagnosis present

## 2016-04-01 DIAGNOSIS — K219 Gastro-esophageal reflux disease without esophagitis: Secondary | ICD-10-CM | POA: Diagnosis present

## 2016-04-01 DIAGNOSIS — Z881 Allergy status to other antibiotic agents status: Secondary | ICD-10-CM

## 2016-04-01 DIAGNOSIS — T8453XA Infection and inflammatory reaction due to internal right knee prosthesis, initial encounter: Secondary | ICD-10-CM | POA: Diagnosis not present

## 2016-04-01 DIAGNOSIS — M009 Pyogenic arthritis, unspecified: Secondary | ICD-10-CM | POA: Diagnosis present

## 2016-04-01 DIAGNOSIS — Z886 Allergy status to analgesic agent status: Secondary | ICD-10-CM

## 2016-04-01 DIAGNOSIS — Z8619 Personal history of other infectious and parasitic diseases: Secondary | ICD-10-CM

## 2016-04-01 DIAGNOSIS — B9561 Methicillin susceptible Staphylococcus aureus infection as the cause of diseases classified elsewhere: Secondary | ICD-10-CM | POA: Diagnosis present

## 2016-04-01 DIAGNOSIS — D693 Immune thrombocytopenic purpura: Secondary | ICD-10-CM | POA: Diagnosis present

## 2016-04-01 DIAGNOSIS — K760 Fatty (change of) liver, not elsewhere classified: Secondary | ICD-10-CM | POA: Diagnosis present

## 2016-04-01 DIAGNOSIS — Y792 Prosthetic and other implants, materials and accessory orthopedic devices associated with adverse incidents: Secondary | ICD-10-CM | POA: Diagnosis present

## 2016-04-01 DIAGNOSIS — Z96651 Presence of right artificial knee joint: Secondary | ICD-10-CM | POA: Diagnosis present

## 2016-04-01 HISTORY — DX: Personal history of other infectious and parasitic diseases: Z86.19

## 2016-04-01 MED ORDER — HYDROCODONE-ACETAMINOPHEN 5-325 MG PO TABS: 1.0000 | ORAL_TABLET | ORAL | 0 refills | Status: DC | PRN

## 2016-04-01 MED ORDER — HYDROCODONE-ACETAMINOPHEN 5-325 MG PO TABS
2.0000 | ORAL_TABLET | Freq: Once | ORAL | Status: AC
  Administered 2016-04-01: 2 via ORAL
  Filled 2016-04-01: qty 2

## 2016-04-01 MED ORDER — LIDOCAINE HCL (PF) 1 % IJ SOLN
10.0000 mL | Freq: Once | INTRAMUSCULAR | Status: AC
  Administered 2016-04-01: 10 mL
  Filled 2016-04-01: qty 10

## 2016-04-01 MED ORDER — DOXYCYCLINE HYCLATE 100 MG PO CAPS: 100.0000 mg | ORAL_CAPSULE | Freq: Two times a day (BID) | ORAL | 0 refills | Status: DC

## 2016-04-01 MED ORDER — POVIDONE-IODINE 10 % EX SOLN
CUTANEOUS | Status: AC
  Administered 2016-04-01: 10:00:00
  Filled 2016-04-01: qty 118

## 2016-04-01 MED ORDER — DOXYCYCLINE HYCLATE 100 MG PO TABS
100.0000 mg | ORAL_TABLET | Freq: Once | ORAL | Status: AC
  Administered 2016-04-01: 100 mg via ORAL
  Filled 2016-04-01: qty 1

## 2016-04-01 NOTE — ED Provider Notes (Signed)
[place] DEPT Provider Note   CSN: [id number] Arrival date & time: 04/01/16  0759     History   Chief Complaint Chief Complaint  Patient presents with  . Knee Pain    HPI Stephen Burch is a 60 y.o. male presenting with right posterior knee pain, swelling and redness which started yesterday.  He is currently one month out from having a surgery to place a wound vac at this site to try to heal a chronic Bakers cyst (initial Bakers cyst incision last year).  The wound vac was removed 2 weeks ago and had no problems with the wound site until yesterday.  He denies any injury. He went shopping yesterday, walked a little bit, but did over exert and by yesterday evening he had increased pain and swelling. He denies fevers, chills or other complaint. He has had multiple surgeries of the right knee including joint replacement.  The history is provided by the patient.    Past Medical History:  Diagnosis Date  . Arthritis    knees  . Asthma    as a kid  . Cirrhosis ([place])   . GERD (gastroesophageal reflux disease)   . History of hiatal hernia   . Hx of cardiac arrest   . Kidney stones   . Pneumonia    3 years ago  . Thrombocytopenia [facility])     Patient Active Problem List   Diagnosis Date Noted  . Thrombocytopenia ([place])   . Hypomagnesemia   . Cardiac arrest ([place]) 02/22/2016  . Encounter for central line placement   . Shock circulatory ([place])   . Vitiligo 08/22/2015  . AD (atopic dermatitis) 08/22/2015  . Osteoarthritis of right knee 08/22/2015  . Hepatic cirrhosis ([place]) 08/22/2015  . Hyperglycemia 08/22/2015  . GERD (gastroesophageal reflux disease) 08/22/2015  . Ganglion of left wrist 08/22/2015  . ED (erectile dysfunction) of organic origin 08/22/2015  . Asthma 08/22/2015  . Dysplastic nodule of liver 08/22/2015  . Baker cyst 09/06/2014  . H/O total knee replacement 08/05/2014  . Bilateral inguinal hernia 08/01/2012  . Splenomegaly 08/01/2012  . Portal hypertension  ([place]) 08/01/2012  . Gallstones 08/01/2012    Past Surgical History:  Procedure Laterality Date  . APPLICATION OF WOUND VAC Right 02/22/2016   Procedure: APPLICATION OF WOUND VAC;  Surgeon: Vickey Huger, MD;  Location: [place];  Service: Orthopedics;  Laterality: Right;  . CARDIAC CATHETERIZATION N/A 02/24/2016   Procedure: Left Heart Cath and Coronary Angiography;  Surgeon: Burnell Blanks, MD;  Location: [place] CV LAB;  Service: Cardiovascular;  Laterality: N/A;  . COLONOSCOPY    . CT SCAN of Abdomen  06/27/2012   Cirrhosis with portal hypertension. Marked slenomegaly. Bilateral inguinal hernias. Right inguinal hernia contains part of the bladder. Report from [place] Right 09/06/2014   Procedure: OPEN EXCISION BAKER'S CYST RIGHT KNEE;  Surgeon: Vickey Huger, MD;  Location: [place];  Service: Orthopedics;  Laterality: Right;  . [place]  . ESOPHAGOGASTRODUODENOSCOPY N/A 09/17/2012   Procedure: ESOPHAGOGASTRODUODENOSCOPY (EGD);  Surgeon: Rogene Houston, MD;  [facility];  Service: Endoscopy;  Laterality: N/A;  200  . ESOPHAGOGASTRODUODENOSCOPY N/A 12/15/2015   Procedure: ESOPHAGOGASTRODUODENOSCOPY (EGD);  Surgeon: Rogene Houston, MD;  [facility];  Service: Endoscopy;  Laterality: N/A;  1245  . Exercise tredmill test  03/27/2005   Normal  . HERNIA REPAIR Bilateral 05/22/2013, 11/1997   x 2 (Inguinal hernia) Dr. Dalbert Batman, Dr.  Cope  . INGUINAL HERNIA REPAIR Bilateral 05/22/2013   Procedure: REPAIR OF RECURRENT INCARCERATED INGUINAL HERNIA WITH MESH RIGHT SIDE,  REPAIR OF RECURRENT INGUINAL HERNIA WITH MESH LEFT SIDE;  Surgeon: [name], MD;  Location: [place];  Service: General;  Laterality: Bilateral;  . INSERTION OF MESH Bilateral 05/22/2013   Procedure: INSERTION OF MESH;  Surgeon: [name], MD;  Location: [place];  Service: General;  Laterality: Bilateral;  . IRRIGATION AND DEBRIDEMENT KNEE Right  02/22/2016   Procedure: IRRIGATION AND DEBRIDEMENT KNEE;  Surgeon: Vickey Huger, MD;  Location: [place];  Service: Orthopedics;  Laterality: Right;  . Irrigation and Debridement right knee  03/23/2009   Dr. Sabra Heck, [facility]  . JOINT REPLACEMENT  03/16/2009   RIGHT KNEE REPLACEMENT  . KNEE ARTHROSCOPY WITH EXCISION BAKER'S CYST Right 10/01/14  . Radionuclide Imaging Study     Normal LV systolic function with ejection of 50%; Normal myocardial perfusion without evidence of myocardial ischemia  . REPLACEMENT TOTAL KNEE     Bilateral knee replacement, 2010, 2012  . TONSILLECTOMY         Home Medications    Prior to Admission medications   Medication Sig Start Date End Date Taking? Authorizing Provider  pantoprazole (PROTONIX) 40 MG tablet Take 1 tablet (40 mg total) by mouth daily. 12/13/15  Yes Rogene Houston, MD  doxycycline (VIBRAMYCIN) 100 MG capsule Take 1 capsule (100 mg total) by mouth 2 (two) times daily. 04/01/16   Evalee Jefferson, PA-C  HYDROcodone-acetaminophen (NORCO/VICODIN) 5-325 MG tablet Take 1 tablet by mouth every 4 (four) hours as needed. 04/01/16   Evalee Jefferson, PA-C    Family History Family History  Problem Relation Age of Onset  . Diabetes Mother     type 2, born 77  . Arthritis Father     died age 13  . GI Bleed Father     upper GI Bleed, non-ETOH cirrhosis  . Arthritis Brother   . Diabetes Brother     type 2  . Diabetes Paternal Uncle     type 2  . Heart attack Maternal Grandmother   . Heart attack Maternal Grandfather   . Colon cancer Neg Hx   . Colon polyps Neg Hx     Social History Social History  Substance Use Topics  . Smoking status: Former Smoker    Packs/day: 0.25    Years: 3.00    Types: Cigarettes    Quit date: 07/03/1975  . Smokeless tobacco: Never Used     Comment: none since age 33-1 pack per week when he was smoking   . Alcohol use No     Allergies   Aspirin; Penicillins; and Vancomycin   Review of Systems Review of Systems    Constitutional: Negative for fever.  Musculoskeletal: Positive for arthralgias and joint swelling. Negative for myalgias.  Skin: Positive for color change.  Neurological: Negative for weakness and numbness.     Physical Exam Updated Vital Signs BP (!) 89/55 (BP Location: Left Arm)   Pulse 73   Temp 98.3 F (36.8 C) (Oral)   Resp 18   Ht 6' (1.829 m)   Wt 97.1 kg   SpO2 97%   BMI 29.02 kg/m   Physical Exam  Constitutional: He appears well-developed and well-nourished.  HENT:  Head: Atraumatic.  Neck: Normal range of motion.  Cardiovascular:  Pulses equal bilaterally  Musculoskeletal: He exhibits edema and tenderness.  Ttp, swelling and redness in the right popliteal fossa with a  central intact scab.  No red streaking.   Decreased ROM of the knee secondary to pain.   Neurological: He is alert. He has normal strength. He displays normal reflexes. No sensory deficit.  Skin: Skin is warm and dry.  Psychiatric: He has a normal mood and affect.     ED Treatments / Results  Labs (all labs ordered are listed, but only abnormal results are displayed) Labs Reviewed  AEROBIC/ANAEROBIC CULTURE (SURGICAL/DEEP WOUND)    EKG  EKG Interpretation None       Radiology No results found.  Dr. Sabra Heck performed bedside US with superficial fluid collection suspicious for abscess.  Call placed to Dr. Ronnie Derby to discuss.  Call from Dr. French Ana, advised needle aspiration and/or I & D.  Also recommended dose of abx here/ doxycycline for home use.  Needs f/u with Dr. Ronnie Derby within the next 1-2 days.   Procedures Procedures (including critical care time)  INCISION AND DRAINAGE Performed by: Evalee Jefferson Consent: Verbal consent obtained. Risks and benefits: risks, benefits and alternatives were discussed Type: abscess.  Procedure done using sterile technique.  Betadine, sterile drapes.    Body area: right popliteal space  Anesthesia: local infiltration.  Incision was made with a  scalpel.  Local anesthetic: lidocaine 1% without epinephrine  Anesthetic total: 6 ml  Complexity: complex Blunt dissection to break up loculations  Drainage: bloody drainage mixed with oily textured fluid suspicious for synovial fluid.  Not clearly purulent.  Drainage amount: moderate.  Packing material: 1/4 in iodoform gauze  Patient tolerance: Patient tolerated the procedure well with no immediate complications.     Medications Ordered in ED Medications  povidone-iodine (BETADINE) 10 % external solution (  Given by Other 04/01/16 1005)  HYDROcodone-acetaminophen (NORCO/VICODIN) 5-325 MG per tablet 2 tablet (2 tablets Oral Given 04/01/16 0830)  lidocaine (PF) (XYLOCAINE) 1 % injection 10 mL (10 mLs Other Given by Other 04/01/16 0933)  doxycycline (VIBRA-TABS) tablet 100 mg (100 mg Oral Given 04/01/16 1009)     Initial Impression / Assessment and Plan / ED Course  I have reviewed the triage vital signs and the nursing notes.  Pertinent labs & imaging results that were available during my care of the patient were reviewed by me and considered in my medical decision making (see chart for details).  Clinical Course    Cultures sent.  Pt was placed on doxycycline.  Dressing applied.  Pt to see Dr. Ronnie Derby tomorrow.  Pt knows to call for appt.  Final Clinical Impressions(s) / ED Diagnoses   Final diagnoses:  Acute pain of right knee  Baker's cyst of knee, right    New Prescriptions New Prescriptions   DOXYCYCLINE (VIBRAMYCIN) 100 MG CAPSULE    Take 1 capsule (100 mg total) by mouth 2 (two) times daily.   HYDROCODONE-ACETAMINOPHEN (NORCO/VICODIN) 5-325 MG TABLET    Take 1 tablet by mouth every 4 (four) hours as needed.     Evalee Jefferson, PA-C 04/01/16 [place], PA-C 04/01/16 1015    Noemi Chapel, MD 04/01/16 [phone]

## 2016-04-01 NOTE — Discharge Instructions (Signed)
Keep your knee incision covered and dry.  Change the dressing if it soaks through.  Otherwise leave it in place and plan to see your surgeon tomorrow or Tuesday at the latest for a recheck of your knee.  I am less likely to think this is an infection (although wound cultures were obtained and should result within the next several days) and more likely to think this is a recurrence of your Bakers cyst given the nature of the fluid drained.   You may take the hydrocodone prescribed for pain relief.  This will make you drowsy - do not drive within 4 hours of taking this medication.

## 2016-04-01 NOTE — ED Provider Notes (Signed)
The patient is a 60 year old male with a history of multiple surgical procedures on his right leg including a knee replacement with revision, popliteal cyst removal followed by a revision which was done approximately one month ago during which time the patient had a cardiac arrest during his procedure. He had a wound VAC which was removed 2 weeks ago and has done very well since that time with good wound healing. Yesterday he noted increased swelling at that area with some redness, no fevers. He has had some difficulty ambulating because of pain in that area. On exam with the patient in the prone position he does have an obvious swollen area in the right popliteal fossa, there is significant tenderness in the center of that area and on my bedside ultrasound there does appear to be a fluid-filled pocket consistent with an abscess. I do not see any cobblestoning of the surrounding tissue. This is superficial to the popliteal vessels. The patient has decreased range of motion of the knee secondary to pain. The patient will need to have care discussed with the orthopedist prior to any incision and drainage in the emergency department, antibiotics and close follow-up.  EMERGENCY DEPARTMENT US SOFT TISSUE INTERPRETATION "Study: Limited Ultrasound of the noted body part in comments below"  INDICATIONS: Soft tissue infection Multiple views of the body part are obtained with a multi-frequency linear probe  PERFORMED BY:  Myself  IMAGES ARCHIVED?: Yes  SIDE:Right   BODY PART:Lower extremity  FINDINGS: Other fluid collection present  LIMITATIONS:  Emergent Procedure  INTERPRETATION:  Fluid collection present  COMMENT:  [place] confirms popliteal abscess    Medical screening examination/treatment/procedure(s) were conducted as a shared visit with non-physician practitioner(s) and myself.  I personally evaluated the patient during the encounter.  Clinical Impression:   Final diagnoses:  Acute pain of  right knee  Baker's cyst of knee, right          Noemi Chapel, MD 04/01/16 1331

## 2016-04-01 NOTE — ED Provider Notes (Addendum)
[place] DEPT Provider Note   CSN: [id number] Arrival date & time: 04/01/16  2122  By signing my name below, I, Gwenlyn Fudge, attest that this documentation has been prepared under the direction and in the presence of Varney Biles, MD. Electronically Signed: Gwenlyn Fudge, ED Scribe. 04/01/16. 6:28 AM.   History   Chief Complaint Chief Complaint  Patient presents with  . Knee Pain   HPI  HPI Comments: REVANTH NEIDIG is a 60 y.o. male with PMHx of Liver Cirrhosis who presents to the Emergency Department complaining of gradual onset, constant right knee pain onset 1 day. Pt had surgery for Baker's cysts on 03/24/2016. He was seen in ED for knee pain earlier and was started on antibiotics. Denies injury to knee and pain prior to surgery. Pt reports associated swelling and decreased ROM due to pain. Denies hx of DM or any autoimmune disorders. Pt has had 5 surgeries on this knee before. Denies nausea, vomiting, fever, chills.  Past Medical History:  Diagnosis Date  . Liver cirrhosis (H[facility])     There are no active problems to display for this patient.   Past Surgical History:  Procedure Laterality Date  . KNEE SURGERY       Home Medications    Prior to Admission medications   Medication Sig Start Date End Date Taking? Authorizing Provider  HYDROcodone-acetaminophen (NORCO/VICODIN) 5-325 MG tablet Take 1 tablet by mouth every 6 (six) hours as needed for moderate pain.   Yes Historical Provider, MD  pantoprazole (PROTONIX) 40 MG tablet Take 40 mg by mouth daily.   Yes Historical Provider, MD    Family History No family history on file.  Social History Social History  Substance Use Topics  . Smoking status: Never Smoker  . Smokeless tobacco: Never Used  . Alcohol use No     Allergies   Asa [aspirin]; Penicillins; and Vancomycin   Review of Systems Review of Systems A complete 10 system review of systems was obtained and all systems are negative except as noted  in the HPI and PMH.    Physical Exam Updated Vital Signs BP 108/78   Pulse 81   Temp 99.8 F (37.7 C) (Oral)   Resp (!) 27   Ht 6' (1.829 m)   Wt 214 lb (97.1 kg)   SpO2 94%   BMI 29.02 kg/m   Physical Exam  Constitutional: He is oriented to person, place, and time. He appears well-developed and well-nourished.  HENT:  Head: Normocephalic.  Eyes: EOM are normal.  Neck: Normal range of motion.  Pulmonary/Chest: Effort normal.  Abdominal: He exhibits no distension.  Musculoskeletal: Normal range of motion.  warm to touch Right knee has effusion and is edematous Anteriorly no erythema  Posterior aspect pt has erythema and some edema that measures 11 cm x 5 cm Pt also has packing in place with no drainage to palpation  Neurological: He is alert and oriented to person, place, and time.  Psychiatric: He has a normal mood and affect.  Nursing note and vitals reviewed.  ED Treatments / Results  DIAGNOSTIC STUDIES: Oxygen Saturation is 100% on RA, normal by my interpretation.    COORDINATION OF CARE: 11:25 PM Discussed treatment plan with pt at bedside which includes  CT of Right Knee and pt agreed to plan.  Labs (all labs ordered are listed, but only abnormal results are displayed) Labs Reviewed  COMPREHENSIVE METABOLIC PANEL - Abnormal; Notable for the following:       Result  Value   Potassium 3.4 (*)    Glucose, Bld 178 (*)    Calcium 8.3 (*)    Total Bilirubin 2.4 (*)    All other components within normal limits  CBC WITH DIFFERENTIAL/PLATELET - Abnormal; Notable for the following:    Platelets 63 (*)    Lymphs Abs 0.5 (*)    All other components within normal limits  C-REACTIVE PROTEIN - Abnormal; Notable for the following:    CRP 13.3 (*)    All other components within normal limits  PROTIME-INR - Abnormal; Notable for the following:    Prothrombin Time 15.5 (*)    All other components within normal limits  SYNOVIAL CELL COUNT + DIFF, W/ CRYSTALS - Abnormal;  Notable for the following:    Appearance-Synovial TURBID (*)    WBC, Synovial 26,050 (*)    Neutrophil, Synovial 84 (*)    Monocyte-Macrophage-Synovial Fluid 14 (*)    All other components within normal limits  BODY FLUID CULTURE  CULTURE, BLOOD (ROUTINE X 2)  CULTURE, BLOOD (ROUTINE X 2)  SEDIMENTATION RATE  GLUCOSE, SYNOVIAL FLUID  I-STAT CG4 LACTIC ACID, ED    EKG  EKG Interpretation None       Radiology Dg Chest Portable 1 View  Result Date: 04/02/2016 CLINICAL DATA:  Fever. EXAM: PORTABLE CHEST 1 VIEW COMPARISON:  None. FINDINGS: Shallow inspiration. Normal heart size and pulmonary vascularity. No focal airspace disease or consolidation in the lungs. No blunting of costophrenic angles. No pneumothorax. Mediastinal contours appear intact. IMPRESSION: Shallow inspiration.  No evidence of active pulmonary disease. Electronically Signed   By: Lucienne Capers M.D.   On: 04/02/2016 00:45   Dg Knee Complete 4 Views Right  Result Date: 04/02/2016 CLINICAL DATA:  Pain and swelling of the posterior right knee. Recent removal of Baker's cyst on 02/22/2016. EXAM: RIGHT KNEE - COMPLETE 4+ VIEW COMPARISON:  None. FINDINGS: Postoperative right total knee arthroplasty with patellar femoral component. Components appear well seated. Minimal lucency at the bone hardware interface for the tibial component could indicate mild loosening. Multiple old appearing ossicles adjacent to the knee are likely postoperative. Moderate size right knee effusion. Skin thickening over the suprapatellar region. No acute fracture or dislocation. IMPRESSION: Postoperative right knee arthroplasty. Can't exclude mild loosening of the tibial component. Moderate size right knee effusion with overlying skin thickening. No acute fracture or dislocation. Electronically Signed   By: Lucienne Capers M.D.   On: 04/02/2016 00:44    Procedures .Marland KitchenIncision and Drainage Date/Time: 04/02/2016 1:03 AM Performed by: Varney Biles Authorized by: Varney Biles   Consent:    Consent obtained:  Verbal   Consent given by:  Patient   Risks discussed:  Bleeding, pain, infection and incomplete drainage   Alternatives discussed:  No treatment and observation Universal protocol:    Procedure explained and questions answered to patient or proxy's satisfaction: yes     Imaging studies available: yes     Site/side marked: yes     Patient identity confirmed:  Arm band Location:    Type:  Fluid collection   Location:  Lower extremity   Lower extremity location:  Knee   Knee location:  R knee Pre-procedure details:    Skin preparation:  Betadine Anesthesia (see MAR for exact dosages):    Anesthesia method:  None Procedure type:    Complexity:  Simple Procedure details:    Needle aspiration: yes     Needle size:  18 G   Incision types:  Single  straight   Drainage:  Serosanguinous   Drainage amount:  Moderate Post-procedure details:    Patient tolerance of procedure:  Tolerated well, no immediate complications Comments:     Risk of the procedure including infection (soft tissue / joint), need for further surgeries, bleeding, pain, nerve injury discussed - and pt verbally consented to needle aspiration and drainage.   .Joint Aspiration/Arthrocentesis Date/Time: 04/02/2016 1:23 AM Performed by: Varney Biles Authorized by: Varney Biles   Consent:    Consent obtained:  Verbal   Consent given by:  Patient   Risks discussed:  Infection   Alternatives discussed:  Observation Location:    Location:  Knee   Knee:  R knee Anesthesia (see MAR for exact dosages):    Anesthesia method:  Local infiltration   Local anesthetic:  Lidocaine 1% WITH epi Procedure details:    Preparation: Patient was prepped and draped in usual sterile fashion     Needle gauge:  18 G   Ultrasound guidance: no     Approach:  Posterior   Aspirate amount:  10   Aspirate characteristics:  Yellow   Specimen collected: yes    Post-procedure details:    Dressing:  Adhesive bandage   Patient tolerance of procedure:  Tolerated well, no immediate complications   (including critical care time)  ULTRASOUND LIMITED SOFT TISSUE/ MUSCULOSKELETAL: Right knee - posterior Indication: edema - evaluation for cellulitis and possible abscess. Linear probe used to evaluate area of interest in two planes. Findings:  There is a fluid pocket just below the I & d site. No cobble stoning seen. Performed by: Dr Kathrynn Humble Images saved electronically    Medications Ordered in ED Medications  lidocaine-EPINEPHrine (XYLOCAINE W/EPI) 2 %-1:200000 (PF) injection 10 mL (10 mLs Intradermal Given 04/02/16 0025)  doxycycline (VIBRA-TABS) tablet 100 mg (100 mg Oral Given 04/02/16 0303)     Initial Impression / Assessment and Plan / ED Course  I have reviewed the triage vital signs and the nursing notes.  Pertinent labs & imaging results that were available during my care of the patient were reviewed by me and considered in my medical decision making (see chart for details).  Clinical Course  Comment By Time  Bedside US revealed a pocket of fluid collection, and we decided to attempt US guided needle aspiration at that site. The yield was serosanguinous fluid initially, followed by what appeared to be synovial fluid. Fluid sent for analysis. We had about 8 cc of fluid drained. There was no cobble stone type finding - so possibly there might be erysipelas. Knee Xray reveals effusion. I havent heard back from Ortho yet. Varney Biles, MD 10/02 [phone]  3rd attempt to reach Ortho requested to secretary. Cell count > 20K, no crystals. In this clinical setting, septic arthritis will have to be ruled out by Orthopedist. Synovial gram stain is negative. Varney Biles, MD 10/02 [phone]  Dr. Ronnie Derby to see the patient - likely  to get OR washout. PT aware. Varney Biles, MD 10/02 [phone]   I personally performed the services described in this  documentation, which was scribed in my presence. The recorded information has been reviewed and is accurate.  Pt comes in with cc of knee pain. Pt has liver cirrhosis hx and he had R knee baker cyst that was drained 8/24. At the time of the intubation he had VF arrest and required admission to the ICU. Pt reports knee pain and swelling that started last night. S/p ER visit today - had incision  and drainage and bedside US in the ER, with no significant drainage. There was a fluid pocket seen by the EDP. Pt has generalized knee pain, now has fevers - so he comes to the ER. Pt was started on doxy.  DDX: Post op infection of the baker's cyst Soft tissue infection around the posterior knee  - cellulitis vs. erysipelas Septic joint  Formal US will not be helpful, as we clearly see edema - and it won't be able to distinguish between post op fluid collection and abscess. CT ordered w/ contrast after consultation with the radiologist.  Plan is to get labs, get CT and likely aspirate the knee as well.  We will initiate sepsis workup. Get CRP and SED RATE as well. We will give fluids. We will start tx for soft tissue infection. Will possibly need admission.  Final Clinical Impressions(s) / ED Diagnoses   Final diagnoses:  Knee pain, acute  Pyogenic arthritis of right knee joint, due to unspecified organism [facility])    New Prescriptions New Prescriptions   No medications on file     Varney Biles, MD 04/02/16 [place], MD 04/02/16 [id number]    Varney Biles, MD 04/04/16 0425

## 2016-04-01 NOTE — ED Notes (Signed)
Dressing appled to right knee. Telfa, Gauze, ABD pad, and ace bandage-patient tolerated well.

## 2016-04-01 NOTE — ED Triage Notes (Addendum)
Pt had right knee operation 02/22/16 to remove bakers cyst, has been having knee pain since yesterday with small amount of drainage.  PT states he was given vancomycin during surgery causing him to "flatline."  Pt alert and oriented at this time. Upon assessment pt appears to have abscess behind right knee.

## 2016-04-01 NOTE — ED Triage Notes (Signed)
Pt. reports right knee pain with swelling and drainage onset yesterday , seen at [facility] this morning discharged home with oral antibiotic and pain medication after incision and drainage at ER .

## 2016-04-02 ENCOUNTER — Other Ambulatory Visit: Payer: Self-pay

## 2016-04-02 ENCOUNTER — Inpatient Hospital Stay (HOSPITAL_COMMUNITY): Payer: Medicare Other | Admitting: Anesthesiology

## 2016-04-02 ENCOUNTER — Inpatient Hospital Stay (HOSPITAL_COMMUNITY): Admission: EM | Admit: 2016-04-02 | Payer: Medicare Other | Source: Ambulatory Visit | Admitting: Orthopedic Surgery

## 2016-04-02 ENCOUNTER — Encounter (HOSPITAL_COMMUNITY): Payer: Self-pay | Admitting: Anesthesiology

## 2016-04-02 ENCOUNTER — Encounter (HOSPITAL_COMMUNITY)
Admission: EM | Disposition: A | Discharge status: Home Health | Payer: Self-pay | Source: Home / Self Care | Attending: Orthopedic Surgery

## 2016-04-02 ENCOUNTER — Encounter (HOSPITAL_COMMUNITY): Admission: EM | Payer: Self-pay | Source: Ambulatory Visit

## 2016-04-02 DIAGNOSIS — K760 Fatty (change of) liver, not elsewhere classified: Secondary | ICD-10-CM | POA: Diagnosis present

## 2016-04-02 DIAGNOSIS — T8453XD Infection and inflammatory reaction due to internal right knee prosthesis, subsequent encounter: Secondary | ICD-10-CM | POA: Diagnosis not present

## 2016-04-02 DIAGNOSIS — Z886 Allergy status to analgesic agent status: Secondary | ICD-10-CM | POA: Diagnosis not present

## 2016-04-02 DIAGNOSIS — M009 Pyogenic arthritis, unspecified: Secondary | ICD-10-CM | POA: Diagnosis present

## 2016-04-02 DIAGNOSIS — Z96651 Presence of right artificial knee joint: Secondary | ICD-10-CM | POA: Diagnosis present

## 2016-04-02 DIAGNOSIS — M7121 Synovial cyst of popliteal space [Baker], right knee: Secondary | ICD-10-CM | POA: Diagnosis present

## 2016-04-02 DIAGNOSIS — M25561 Pain in right knee: Secondary | ICD-10-CM | POA: Diagnosis present

## 2016-04-02 DIAGNOSIS — Z881 Allergy status to other antibiotic agents status: Secondary | ICD-10-CM | POA: Diagnosis not present

## 2016-04-02 DIAGNOSIS — T8453XA Infection and inflammatory reaction due to internal right knee prosthesis, initial encounter: Secondary | ICD-10-CM | POA: Diagnosis present

## 2016-04-02 DIAGNOSIS — B9561 Methicillin susceptible Staphylococcus aureus infection as the cause of diseases classified elsewhere: Secondary | ICD-10-CM | POA: Diagnosis present

## 2016-04-02 DIAGNOSIS — A4101 Sepsis due to Methicillin susceptible Staphylococcus aureus: Secondary | ICD-10-CM | POA: Diagnosis present

## 2016-04-02 DIAGNOSIS — K7460 Unspecified cirrhosis of liver: Secondary | ICD-10-CM | POA: Diagnosis present

## 2016-04-02 DIAGNOSIS — R509 Fever, unspecified: Secondary | ICD-10-CM | POA: Diagnosis not present

## 2016-04-02 DIAGNOSIS — Z8674 Personal history of sudden cardiac arrest: Secondary | ICD-10-CM | POA: Diagnosis not present

## 2016-04-02 DIAGNOSIS — M00061 Staphylococcal arthritis, right knee: Secondary | ICD-10-CM | POA: Diagnosis not present

## 2016-04-02 DIAGNOSIS — D693 Immune thrombocytopenic purpura: Secondary | ICD-10-CM | POA: Diagnosis present

## 2016-04-02 DIAGNOSIS — K219 Gastro-esophageal reflux disease without esophagitis: Secondary | ICD-10-CM | POA: Diagnosis present

## 2016-04-02 DIAGNOSIS — Y792 Prosthetic and other implants, materials and accessory orthopedic devices associated with adverse incidents: Secondary | ICD-10-CM | POA: Diagnosis present

## 2016-04-02 DIAGNOSIS — M00062 Staphylococcal arthritis, left knee: Secondary | ICD-10-CM | POA: Diagnosis present

## 2016-04-02 DIAGNOSIS — Z880 Allergy status to penicillin: Secondary | ICD-10-CM | POA: Diagnosis not present

## 2016-04-02 HISTORY — PX: I & D KNEE WITH POLY EXCHANGE: SHX5024

## 2016-04-02 HISTORY — DX: Pyogenic arthritis, unspecified: M00.9

## 2016-04-02 LAB — COMPREHENSIVE METABOLIC PANEL
ALBUMIN: 3.7 g/dL (ref 3.5–5.0)
ALK PHOS: 101 U/L (ref 38–126)
ALT: 41 U/L (ref 17–63)
AST: 38 U/L (ref 15–41)
Anion gap: 9 (ref 5–15)
BILIRUBIN TOTAL: 2.4 mg/dL — AB (ref 0.3–1.2)
BUN: 11 mg/dL (ref 6–20)
CALCIUM: 8.3 mg/dL — AB (ref 8.9–10.3)
CO2: 24 mmol/L (ref 22–32)
CREATININE: 1.13 mg/dL (ref 0.61–1.24)
Chloride: 104 mmol/L (ref 101–111)
GFR calc Af Amer: >60 mL/min (ref >60)
GFR calc non Af Amer: >60 mL/min (ref >60)
GLUCOSE: 178 mg/dL — AB (ref 65–99)
Potassium: 3.4 mmol/L — ABNORMAL LOW (ref 3.5–5.1)
Sodium: 137 mmol/L (ref 135–145)
TOTAL PROTEIN: 6.5 g/dL (ref 6.5–8.1)

## 2016-04-02 LAB — CBC
HCT: 42 % (ref 39.0–52.0)
Hemoglobin: 14.2 g/dL (ref 13.0–17.0)
MCH: 30.3 pg (ref 26.0–34.0)
MCHC: 33.8 g/dL (ref 30.0–36.0)
MCV: 89.6 fL (ref 78.0–100.0)
PLATELETS: 51 10*3/uL — AB (ref 150–400)
RBC: 4.69 MIL/uL (ref 4.22–5.81)
RDW: 13.5 % (ref 11.5–15.5)
WBC: 6.7 10*3/uL (ref 4.0–10.5)

## 2016-04-02 LAB — SYNOVIAL CELL COUNT + DIFF, W/ CRYSTALS
Crystals, Fluid: NONE SEEN
Lymphocytes-Synovial Fld: 2 % (ref 0–20)
Monocyte-Macrophage-Synovial Fluid: 14 % — ABNORMAL LOW (ref 50–90)
NEUTROPHIL, SYNOVIAL: 84 % — AB (ref 0–25)
WBC, Synovial: 26050 /mm3 — ABNORMAL HIGH (ref 0–200)

## 2016-04-02 LAB — SURGICAL PCR SCREEN
MRSA, PCR: NEGATIVE
Staphylococcus aureus: POSITIVE — AB

## 2016-04-02 LAB — C-REACTIVE PROTEIN
CRP: 13.3 mg/dL — AB (ref <1.0)
CRP: 18.6 mg/dL — AB (ref <1.0)

## 2016-04-02 LAB — CBC WITH DIFFERENTIAL/PLATELET
BASOS ABS: 0 10*3/uL (ref 0.0–0.1)
BASOS PCT: 0 %
EOS PCT: 0 %
Eosinophils Absolute: 0 10*3/uL (ref 0.0–0.7)
HCT: 41.2 % (ref 39.0–52.0)
Hemoglobin: 14 g/dL (ref 13.0–17.0)
Lymphocytes Relative: 7 %
Lymphs Abs: 0.5 10*3/uL — ABNORMAL LOW (ref 0.7–4.0)
MCH: 30.4 pg (ref 26.0–34.0)
MCHC: 34 g/dL (ref 30.0–36.0)
MCV: 89.6 fL (ref 78.0–100.0)
MONO ABS: 0.9 10*3/uL (ref 0.1–1.0)
MONOS PCT: 12 %
Neutro Abs: 6.3 10*3/uL (ref 1.7–7.7)
Neutrophils Relative %: 81 %
PLATELETS: 63 10*3/uL — AB (ref 150–400)
RBC: 4.6 MIL/uL (ref 4.22–5.81)
RDW: 13.3 % (ref 11.5–15.5)
WBC: 7.8 10*3/uL (ref 4.0–10.5)

## 2016-04-02 LAB — PATHOLOGIST SMEAR REVIEW

## 2016-04-02 LAB — SEDIMENTATION RATE
SED RATE: 12 mm/h (ref 0–16)
Sed Rate: 7 mm/hr (ref 0–16)

## 2016-04-02 LAB — PROTIME-INR
INR: 1.22
PROTHROMBIN TIME: 15.5 s — AB (ref 11.4–15.2)

## 2016-04-02 LAB — BASIC METABOLIC PANEL
Anion gap: 8 (ref 5–15)
BUN: 11 mg/dL (ref 6–20)
CALCIUM: 8.4 mg/dL — AB (ref 8.9–10.3)
CO2: 25 mmol/L (ref 22–32)
CREATININE: 0.88 mg/dL (ref 0.61–1.24)
Chloride: 104 mmol/L (ref 101–111)
GFR calc Af Amer: >60 mL/min (ref >60)
GFR calc non Af Amer: >60 mL/min (ref >60)
GLUCOSE: 148 mg/dL — AB (ref 65–99)
Potassium: 3.6 mmol/L (ref 3.5–5.1)
Sodium: 137 mmol/L (ref 135–145)

## 2016-04-02 LAB — ABO/RH: ABO/RH(D): O POS

## 2016-04-02 LAB — I-STAT CG4 LACTIC ACID, ED: Lactic Acid, Venous: 1.82 mmol/L (ref 0.5–1.9)

## 2016-04-02 SURGERY — IRRIGATION AND DEBRIDEMENT KNEE WITH POLY EXCHANGE
Anesthesia: General | Site: Knee | Laterality: Right

## 2016-04-02 MED ORDER — POVIDONE-IODINE 10 % EX SWAB: 2.0000 "application " | Freq: Once | CUTANEOUS | Status: DC

## 2016-04-02 MED ORDER — ACETAMINOPHEN 650 MG RE SUPP: 650.0000 mg | Freq: Four times a day (QID) | RECTAL | Status: DC | PRN

## 2016-04-02 MED ORDER — METHOCARBAMOL 500 MG PO TABS
500.0000 mg | ORAL_TABLET | Freq: Four times a day (QID) | ORAL | Status: DC | PRN
  Administered 2016-04-03 – 2016-04-05 (×4): 500 mg via ORAL
  Filled 2016-04-02 (×5): qty 1

## 2016-04-02 MED ORDER — BUPIVACAINE LIPOSOME 1.3 % IJ SUSP
20.0000 mL | INTRAMUSCULAR | Status: DC
  Filled 2016-04-02: qty 20

## 2016-04-02 MED ORDER — FENTANYL CITRATE (PF) 100 MCG/2ML IJ SOLN
INTRAMUSCULAR | Status: DC | PRN
  Administered 2016-04-02 (×2): 100 ug via INTRAVENOUS

## 2016-04-02 MED ORDER — TRANEXAMIC ACID 1000 MG/10ML IV SOLN
2000.0000 mg | INTRAVENOUS | Status: DC
  Filled 2016-04-02: qty 20

## 2016-04-02 MED ORDER — CHLORHEXIDINE GLUCONATE 4 % EX LIQD: 60.0000 mL | Freq: Once | CUTANEOUS | Status: DC

## 2016-04-02 MED ORDER — CLINDAMYCIN PHOSPHATE 900 MG/50ML IV SOLN
900.0000 mg | INTRAVENOUS | Status: DC
  Filled 2016-04-02: qty 50

## 2016-04-02 MED ORDER — LACTATED RINGERS IV SOLN
INTRAVENOUS | Status: DC | PRN
Start: 2016-04-02 — End: 2016-04-02
  Administered 2016-04-02: 13:00:00 via INTRAVENOUS

## 2016-04-02 MED ORDER — NEOSTIGMINE METHYLSULFATE 10 MG/10ML IV SOLN
INTRAVENOUS | Status: DC | PRN
  Administered 2016-04-02: 2.5 mg via INTRAVENOUS

## 2016-04-02 MED ORDER — FLEET ENEMA 7-19 GM/118ML RE ENEM: 1.0000 | ENEMA | Freq: Once | RECTAL | Status: DC | PRN

## 2016-04-02 MED ORDER — HYDROMORPHONE HCL 1 MG/ML IJ SOLN
0.2500 mg | INTRAMUSCULAR | Status: DC | PRN
  Administered 2016-04-02 (×2): 0.5 mg via INTRAVENOUS

## 2016-04-02 MED ORDER — PHENYLEPHRINE HCL 10 MG/ML IJ SOLN
INTRAMUSCULAR | Status: DC | PRN
  Administered 2016-04-02: 40 ug via INTRAVENOUS
  Administered 2016-04-02: 80 ug via INTRAVENOUS
  Administered 2016-04-02 (×2): 120 ug via INTRAVENOUS
  Administered 2016-04-02: 40 ug via INTRAVENOUS

## 2016-04-02 MED ORDER — SODIUM CHLORIDE 0.9 % IR SOLN
Status: DC | PRN
  Administered 2016-04-02 (×2): 3000 mL

## 2016-04-02 MED ORDER — CLINDAMYCIN PHOSPHATE 900 MG/50ML IV SOLN
900.0000 mg | Freq: Three times a day (TID) | INTRAVENOUS | Status: DC
  Administered 2016-04-02 – 2016-04-03 (×3): 900 mg via INTRAVENOUS
  Filled 2016-04-02 (×5): qty 50

## 2016-04-02 MED ORDER — MIDAZOLAM HCL 2 MG/2ML IJ SOLN: 0.5000 mg | Freq: Once | INTRAMUSCULAR | Status: DC | PRN

## 2016-04-02 MED ORDER — PHENYLEPHRINE HCL 10 MG/ML IJ SOLN
INTRAVENOUS | Status: DC | PRN
  Administered 2016-04-02: 25 ug/min via INTRAVENOUS

## 2016-04-02 MED ORDER — BISACODYL 5 MG PO TBEC
5.0000 mg | DELAYED_RELEASE_TABLET | Freq: Every day | ORAL | Status: DC | PRN
  Filled 2016-04-02: qty 1

## 2016-04-02 MED ORDER — DIPHENHYDRAMINE HCL 50 MG/ML IJ SOLN
INTRAMUSCULAR | Status: DC | PRN
  Administered 2016-04-02: 25 mg via INTRAVENOUS

## 2016-04-02 MED ORDER — DIPHENHYDRAMINE HCL 12.5 MG/5ML PO ELIX: 12.5000 mg | ORAL_SOLUTION | ORAL | Status: DC | PRN

## 2016-04-02 MED ORDER — SENNOSIDES-DOCUSATE SODIUM 8.6-50 MG PO TABS
1.0000 | ORAL_TABLET | Freq: Every evening | ORAL | Status: DC | PRN
  Filled 2016-04-02: qty 1

## 2016-04-02 MED ORDER — PROMETHAZINE HCL 25 MG/ML IJ SOLN: 6.2500 mg | INTRAMUSCULAR | Status: DC | PRN

## 2016-04-02 MED ORDER — PANTOPRAZOLE SODIUM 40 MG PO TBEC
40.0000 mg | DELAYED_RELEASE_TABLET | Freq: Every day | ORAL | Status: DC
  Administered 2016-04-03 – 2016-04-07 (×5): 40 mg via ORAL
  Filled 2016-04-02 (×5): qty 1

## 2016-04-02 MED ORDER — ZOLPIDEM TARTRATE 5 MG PO TABS: 5.0000 mg | ORAL_TABLET | Freq: Every evening | ORAL | Status: DC | PRN

## 2016-04-02 MED ORDER — PROPOFOL 10 MG/ML IV BOLUS
INTRAVENOUS | Status: DC | PRN
  Administered 2016-04-02: 120 mg via INTRAVENOUS

## 2016-04-02 MED ORDER — FENTANYL CITRATE (PF) 100 MCG/2ML IJ SOLN
INTRAMUSCULAR | Status: AC
  Filled 2016-04-02: qty 2

## 2016-04-02 MED ORDER — SODIUM CHLORIDE 0.9 % IV SOLN
INTRAVENOUS | Status: DC
  Administered 2016-04-02 (×2): via INTRAVENOUS

## 2016-04-02 MED ORDER — LIDOCAINE-EPINEPHRINE (PF) 2 %-1:200000 IJ SOLN
10.0000 mL | Freq: Once | INTRAMUSCULAR | Status: AC
  Administered 2016-04-02: 10 mL via INTRADERMAL
  Filled 2016-04-02: qty 10

## 2016-04-02 MED ORDER — HYDROMORPHONE HCL 1 MG/ML IJ SOLN
1.0000 mg | INTRAMUSCULAR | Status: DC | PRN
  Administered 2016-04-02: 1 mg via INTRAVENOUS
  Filled 2016-04-02: qty 1

## 2016-04-02 MED ORDER — ONDANSETRON HCL 4 MG PO TABS: 4.0000 mg | ORAL_TABLET | Freq: Four times a day (QID) | ORAL | Status: DC | PRN

## 2016-04-02 MED ORDER — ONDANSETRON HCL 4 MG/2ML IJ SOLN
INTRAMUSCULAR | Status: DC | PRN
  Administered 2016-04-02: 4 mg via INTRAVENOUS

## 2016-04-02 MED ORDER — HYDROMORPHONE HCL 1 MG/ML IJ SOLN
INTRAMUSCULAR | Status: AC
  Administered 2016-04-02: 0.5 mg via INTRAVENOUS
  Filled 2016-04-02: qty 1

## 2016-04-02 MED ORDER — ROCURONIUM BROMIDE 10 MG/ML (PF) SYRINGE
PREFILLED_SYRINGE | INTRAVENOUS | Status: AC
  Filled 2016-04-02: qty 10

## 2016-04-02 MED ORDER — LIDOCAINE 2% (20 MG/ML) 5 ML SYRINGE
INTRAMUSCULAR | Status: AC
  Filled 2016-04-02: qty 10

## 2016-04-02 MED ORDER — METHOCARBAMOL 1000 MG/10ML IJ SOLN
500.0000 mg | Freq: Four times a day (QID) | INTRAVENOUS | Status: DC | PRN
  Filled 2016-04-02: qty 5

## 2016-04-02 MED ORDER — ONDANSETRON HCL 4 MG/2ML IJ SOLN
INTRAMUSCULAR | Status: AC
  Filled 2016-04-02: qty 2

## 2016-04-02 MED ORDER — ENOXAPARIN SODIUM 40 MG/0.4ML ~~LOC~~ SOLN
40.0000 mg | Freq: Every day | SUBCUTANEOUS | Status: DC
  Administered 2016-04-03 – 2016-04-07 (×5): 40 mg via SUBCUTANEOUS
  Filled 2016-04-02 (×5): qty 0.4

## 2016-04-02 MED ORDER — DIPHENHYDRAMINE HCL 50 MG/ML IJ SOLN
INTRAMUSCULAR | Status: AC
  Filled 2016-04-02: qty 1

## 2016-04-02 MED ORDER — BUPIVACAINE-EPINEPHRINE (PF) 0.5% -1:200000 IJ SOLN
INTRAMUSCULAR | Status: AC
  Filled 2016-04-02: qty 30

## 2016-04-02 MED ORDER — DOCUSATE SODIUM 100 MG PO CAPS
100.0000 mg | ORAL_CAPSULE | Freq: Two times a day (BID) | ORAL | Status: DC
  Administered 2016-04-02 – 2016-04-07 (×10): 100 mg via ORAL
  Filled 2016-04-02 (×10): qty 1

## 2016-04-02 MED ORDER — ACETAMINOPHEN 325 MG PO TABS: 650.0000 mg | ORAL_TABLET | Freq: Four times a day (QID) | ORAL | Status: DC | PRN

## 2016-04-02 MED ORDER — MIDAZOLAM HCL 5 MG/5ML IJ SOLN
INTRAMUSCULAR | Status: DC | PRN
  Administered 2016-04-02: 2 mg via INTRAVENOUS

## 2016-04-02 MED ORDER — ROCURONIUM BROMIDE 100 MG/10ML IV SOLN
INTRAVENOUS | Status: DC | PRN
  Administered 2016-04-02: 50 mg via INTRAVENOUS

## 2016-04-02 MED ORDER — ACETAMINOPHEN 500 MG PO TABS: 1000.0000 mg | ORAL_TABLET | Freq: Once | ORAL | Status: DC

## 2016-04-02 MED ORDER — DOXYCYCLINE HYCLATE 100 MG PO TABS
100.0000 mg | ORAL_TABLET | Freq: Once | ORAL | Status: AC
  Administered 2016-04-02: 100 mg via ORAL
  Filled 2016-04-02: qty 1

## 2016-04-02 MED ORDER — LIDOCAINE HCL (CARDIAC) 20 MG/ML IV SOLN
INTRAVENOUS | Status: DC | PRN
  Administered 2016-04-02: 20 mg via INTRATRACHEAL

## 2016-04-02 MED ORDER — GLYCOPYRROLATE 0.2 MG/ML IJ SOLN
INTRAMUSCULAR | Status: DC | PRN
  Administered 2016-04-02: .4 mg via INTRAVENOUS

## 2016-04-02 MED ORDER — MIDAZOLAM HCL 2 MG/2ML IJ SOLN
INTRAMUSCULAR | Status: AC
  Filled 2016-04-02: qty 2

## 2016-04-02 MED ORDER — ACETAMINOPHEN 500 MG PO TABS
1000.0000 mg | ORAL_TABLET | Freq: Four times a day (QID) | ORAL | Status: DC
  Administered 2016-04-02 – 2016-04-07 (×11): 1000 mg via ORAL
  Filled 2016-04-02 (×15): qty 2

## 2016-04-02 MED ORDER — MEPERIDINE HCL 25 MG/ML IJ SOLN: 6.2500 mg | INTRAMUSCULAR | Status: DC | PRN

## 2016-04-02 MED ORDER — OXYCODONE HCL ER 10 MG PO T12A
10.0000 mg | EXTENDED_RELEASE_TABLET | Freq: Two times a day (BID) | ORAL | Status: DC
  Administered 2016-04-02 – 2016-04-07 (×10): 10 mg via ORAL
  Filled 2016-04-02 (×11): qty 1

## 2016-04-02 MED ORDER — BUPIVACAINE-EPINEPHRINE (PF) 0.25% -1:200000 IJ SOLN
INTRAMUSCULAR | Status: AC
  Filled 2016-04-02: qty 30

## 2016-04-02 MED ORDER — EPHEDRINE SULFATE 50 MG/ML IJ SOLN
INTRAMUSCULAR | Status: DC | PRN
  Administered 2016-04-02: 5 mg via INTRAVENOUS

## 2016-04-02 MED ORDER — LIDOCAINE-EPINEPHRINE (PF) 2 %-1:200000 IJ SOLN: 20.0000 mL | Freq: Once | INTRAMUSCULAR | Status: DC

## 2016-04-02 MED ORDER — PROPOFOL 10 MG/ML IV BOLUS
INTRAVENOUS | Status: AC
  Filled 2016-04-02: qty 20

## 2016-04-02 MED ORDER — ONDANSETRON HCL 4 MG/2ML IJ SOLN: 4.0000 mg | Freq: Four times a day (QID) | INTRAMUSCULAR | Status: DC | PRN

## 2016-04-02 MED ORDER — DEXAMETHASONE SODIUM PHOSPHATE 10 MG/ML IJ SOLN
INTRAMUSCULAR | Status: DC | PRN
  Administered 2016-04-02: 8 mg via INTRAVENOUS

## 2016-04-02 MED ORDER — OXYCODONE HCL 5 MG PO TABS
5.0000 mg | ORAL_TABLET | ORAL | Status: DC | PRN
  Administered 2016-04-04 – 2016-04-06 (×6): 10 mg via ORAL
  Filled 2016-04-02 (×6): qty 2

## 2016-04-02 SURGICAL SUPPLY — 58 items
BLADE 10 SAFETY STRL DISP (BLADE) ×18 IMPLANT
BNDG GAUZE ELAST 4 BULKY (GAUZE/BANDAGES/DRESSINGS) ×3 IMPLANT
COVER SURGICAL LIGHT HANDLE (MISCELLANEOUS) ×3 IMPLANT
CUFF TOURNIQUET SINGLE 24IN (TOURNIQUET CUFF) ×3 IMPLANT
DRAPE EXTREMITY T 121X128X90 (DRAPE) ×3 IMPLANT
DRAPE IMP U-DRAPE 54X76 (DRAPES) ×3 IMPLANT
DRAPE INCISE IOBAN 66X45 STRL (DRAPES) ×3 IMPLANT
DRAPE PROXIMA HALF (DRAPES) ×3 IMPLANT
DRAPE U-SHAPE 47X51 STRL (DRAPES) ×3 IMPLANT
DRSG ADAPTIC 3X8 NADH LF (GAUZE/BANDAGES/DRESSINGS) ×3 IMPLANT
DRSG AQUACEL AG ADV 3.5X10 (GAUZE/BANDAGES/DRESSINGS) ×3 IMPLANT
DRSG TEGADERM 2-3/8X2-3/4 SM (GAUZE/BANDAGES/DRESSINGS) ×3 IMPLANT
DURAPREP 26ML APPLICATOR (WOUND CARE) ×3 IMPLANT
ELECT REM PT RETURN 9FT ADLT (ELECTROSURGICAL) ×3
ELECTRODE REM PT RTRN 9FT ADLT (ELECTROSURGICAL) ×1 IMPLANT
EVACUATOR 1/8 PVC DRAIN (DRAIN) ×3 IMPLANT
GAUZE PACKING IODOFORM 1/4X15 (GAUZE/BANDAGES/DRESSINGS) ×3 IMPLANT
GAUZE SPONGE 4X4 12PLY STRL (GAUZE/BANDAGES/DRESSINGS) ×3 IMPLANT
GLOVE BIOGEL M 7.0 STRL (GLOVE) ×3 IMPLANT
GLOVE BIOGEL PI IND STRL 7.5 (GLOVE) ×1 IMPLANT
GLOVE BIOGEL PI IND STRL 8.5 (GLOVE) ×1 IMPLANT
GLOVE BIOGEL PI INDICATOR 7.5 (GLOVE) ×2
GLOVE BIOGEL PI INDICATOR 8.5 (GLOVE) ×2
GLOVE SURG ORTHO 8.0 STRL STRW (GLOVE) ×6 IMPLANT
GOWN STRL REUS W/ TWL LRG LVL3 (GOWN DISPOSABLE) ×2 IMPLANT
GOWN STRL REUS W/ TWL XL LVL3 (GOWN DISPOSABLE) ×2 IMPLANT
GOWN STRL REUS W/TWL 2XL LVL3 (GOWN DISPOSABLE) ×3 IMPLANT
GOWN STRL REUS W/TWL LRG LVL3 (GOWN DISPOSABLE) ×4
GOWN STRL REUS W/TWL XL LVL3 (GOWN DISPOSABLE) ×4
HANDPIECE INTERPULSE COAX TIP (DISPOSABLE) ×2
HOOD PEEL AWAY FACE SHEILD DIS (HOOD) ×9 IMPLANT
IMMOBILIZER KNEE 22 UNIV (SOFTGOODS) ×3 IMPLANT
INSERT TIB LCS RP LRG 12.5 (Knees) ×3 IMPLANT
KIT BASIN OR (CUSTOM PROCEDURE TRAY) ×3 IMPLANT
KIT ROOM TURNOVER OR (KITS) ×3 IMPLANT
MANIFOLD NEPTUNE II (INSTRUMENTS) ×3 IMPLANT
NS IRRIG 1000ML POUR BTL (IV SOLUTION) ×3 IMPLANT
PACK TOTAL JOINT (CUSTOM PROCEDURE TRAY) ×3 IMPLANT
PAD ABD 8X10 STRL (GAUZE/BANDAGES/DRESSINGS) ×3 IMPLANT
PAD ARMBOARD 7.5X6 YLW CONV (MISCELLANEOUS) ×6 IMPLANT
PATELLA REPLACEMENT LG (Knees) ×3 IMPLANT
SET HNDPC FAN SPRY TIP SCT (DISPOSABLE) ×1 IMPLANT
STAPLER VISISTAT 35W (STAPLE) ×3 IMPLANT
SUCTION FRAZIER HANDLE 10FR (MISCELLANEOUS) ×2
SUCTION TUBE FRAZIER 10FR DISP (MISCELLANEOUS) ×1 IMPLANT
SUT MNCRL AB 4-0 PS2 18 (SUTURE) ×3 IMPLANT
SUT PDS AB 0 CT 36 (SUTURE) ×6 IMPLANT
SUT PDS AB 1 CT  36 (SUTURE) ×4
SUT PDS AB 1 CT 36 (SUTURE) ×2 IMPLANT
SUT PROLENE 2 0 CT 1 (SUTURE) ×6 IMPLANT
SUT VIC AB 0 CT1 27 (SUTURE) ×4
SUT VIC AB 0 CT1 27XBRD ANBCTR (SUTURE) ×2 IMPLANT
SUT VIC AB 1 CT1 27 (SUTURE) ×4
SUT VIC AB 1 CT1 27XBRD ANBCTR (SUTURE) ×2 IMPLANT
SUT VIC AB 2-0 CT1 27 (SUTURE) ×4
SUT VIC AB 2-0 CT1 TAPERPNT 27 (SUTURE) ×2 IMPLANT
TOWEL OR 17X24 6PK STRL BLUE (TOWEL DISPOSABLE) ×3 IMPLANT
TOWEL OR 17X26 10 PK STRL BLUE (TOWEL DISPOSABLE) ×3 IMPLANT

## 2016-04-02 NOTE — ED Notes (Signed)
Present after I&D at [facility] yesterday, reports inc in pain, swelling to R knee. Nanavati performed aspiration of synovial fluid from knee & sent for cultures. Lactic normal, blood cultures pending. 2 IV's. Given doxycycline PO.

## 2016-04-02 NOTE — Progress Notes (Signed)
Pt. Arrived to floor from ED. Alert and oriented x 4.. No respiratory distress noted.Family at bedside.

## 2016-04-02 NOTE — Anesthesia Procedure Notes (Signed)
Procedure Name: Intubation Date/Time: 04/02/2016 1:13 PM Performed by: Mariea Clonts Pre-anesthesia Checklist: Patient identified, Emergency Drugs available, Suction available and Patient being monitored Patient Re-evaluated:Patient Re-evaluated prior to inductionOxygen Delivery Method: Circle System Utilized Preoxygenation: Pre-oxygenation with 100% oxygen Intubation Type: IV induction Ventilation: Mask ventilation without difficulty Laryngoscope Size: Miller and 2 Grade View: Grade I Tube type: Oral Number of attempts: 1 Airway Equipment and Method: Stylet and Oral airway Placement Confirmation: ETT inserted through vocal cords under direct vision,  positive ETCO2 and breath sounds checked- equal and bilateral Tube secured with: Tape Dental Injury: Teeth and Oropharynx as per pre-operative assessment

## 2016-04-02 NOTE — Progress Notes (Signed)
Pt. Left to OR via bed. Family at side. No respiratory distress noted.

## 2016-04-02 NOTE — Anesthesia Preprocedure Evaluation (Signed)
Anesthesia Evaluation  Patient identified by MRN, date of birth, ID band Patient awake    Reviewed: Allergy & Precautions, NPO status , Patient's Chart, lab work & pertinent test results  History of Anesthesia Complications (+) history of anesthetic complications (cardiac arrest during GETA)  Airway Mallampati: II  TM Distance: >3 FB Neck ROM: Full    Dental  (+) Dental Advisory Given, Chipped   Pulmonary neg pulmonary ROS,    breath sounds clear to auscultation       Cardiovascular (-) anginanegative cardio ROS   Rhythm:Regular Rate:Normal  H/o cardiac arrest following turning prone under GETA and vanc administration 02/24/16 cath: normal coronaries, normal LVF   Neuro/Psych negative neurological ROS     GI/Hepatic Neg liver ROS, GERD  Medicated and Controlled,  Endo/Other  negative endocrine ROS  Renal/GU negative Renal ROS     Musculoskeletal  (+) Arthritis , Osteoarthritis,    Abdominal   Peds  Hematology H/o ITP: plt 51k   Anesthesia Other Findings   Reproductive/Obstetrics                             Anesthesia Physical Anesthesia Plan  ASA: III  Anesthesia Plan: General   Post-op Pain Management:    Induction: Intravenous  Airway Management Planned: Oral ETT  Additional Equipment:   Intra-op Plan:   Post-operative Plan: Extubation in OR  Informed Consent: I have reviewed the patients History and Physical, chart, labs and discussed the procedure including the risks, benefits and alternatives for the proposed anesthesia with the patient or authorized representative who has indicated his/her understanding and acceptance.   Dental advisory given  Plan Discussed with: CRNA and Surgeon  Anesthesia Plan Comments: (Plan routine monitors, GETA)        Anesthesia Quick Evaluation

## 2016-04-02 NOTE — ED Notes (Signed)
Attempted report 

## 2016-04-02 NOTE — Transfer of Care (Signed)
Immediate Anesthesia Transfer of Care Note  Patient: Stephen Burch  Procedure(s) Performed: Procedure(s): IRRIGATION AND DEBRIDEMENT RIGHT KNEE WITH POLY EXCHANGE (Right)  Patient Location: PACU  Anesthesia Type:General  Level of Consciousness: awake, alert  and oriented  Airway & Oxygen Therapy: Patient Spontanous Breathing and Patient connected to nasal cannula oxygen  Post-op Assessment: Report given to RN and Post -op Vital signs reviewed and stable  Post vital signs: Reviewed and stable  Last Vitals:  Vitals:   04/02/16 0700 04/02/16 1142  BP: 105/71 131/87  Pulse: 79 88  Resp: 17 18  Temp:  37.1 C    Last Pain:  Vitals:   04/02/16 1148  TempSrc:   PainSc: 4          Complications: No apparent anesthesia complications

## 2016-04-02 NOTE — Anesthesia Preprocedure Evaluation (Deleted)
Anesthesia Evaluation    History of Anesthesia Complications (+) history of anesthetic complications (h/o cardiac arrest during surgery: subsequent cath showed normal coronaries and vent function)  Airway        Dental   Pulmonary former smoker (quit 1977),           Cardiovascular   02/24/16 Cath: No angiographic evidence of CAD, Normal LV systolic function   Neuro/Psych    GI/Hepatic GERD  Medicated,(+) Cirrhosis       ,   Endo/Other    Renal/GU      Musculoskeletal  (+) Arthritis , Osteoarthritis,    Abdominal   Peds  Hematology   Anesthesia Other Findings   Reproductive/Obstetrics                             Anesthesia Physical Anesthesia Plan Anesthesia Quick Evaluation

## 2016-04-02 NOTE — H&P (Signed)
[name] MRN:  [id number] DOB/SEX:  1955-10-11/male  CHIEF COMPLAINT:  Painful right Knee  HISTORY: Patient is a 60 y.o. male presented with a history of pain in the right knee. Onset of symptoms was abrupt starting a few days ago with gradually worsening course since that time. Prior procedures on the knee include arthroplasty. Patient has been treated conservatively with over-the-counter NSAIDs and activity modification. Patient currently rates pain in the knee at 8 out of 10 with activity. There is pain at night.  PAST MEDICAL HISTORY: Patient Active Problem List   Diagnosis Date Noted  . Thrombocytopenia (HCC)   . Hypomagnesemia   . Cardiac arrest (HCC) 02/22/2016  . Encounter for central line placement   . Shock circulatory (HCC)   . Vitiligo 08/22/2015  . AD (atopic dermatitis) 08/22/2015  . Osteoarthritis of right knee 08/22/2015  . Hepatic cirrhosis (HCC) 08/22/2015  . Hyperglycemia 08/22/2015  . GERD (gastroesophageal reflux disease) 08/22/2015  . Ganglion of left wrist 08/22/2015  . ED (erectile dysfunction) of organic origin 08/22/2015  . Asthma 08/22/2015  . Dysplastic nodule of liver 08/22/2015  . Baker cyst 09/06/2014  . H/O total knee replacement 08/05/2014  . Bilateral inguinal hernia 08/01/2012  . Splenomegaly 08/01/2012  . Portal hypertension (HCC) 08/01/2012  . Gallstones 08/01/2012   Past Medical History:  Diagnosis Date  . Arthritis    knees  . Asthma    as a kid  . Cirrhosis (HCC)   . GERD (gastroesophageal reflux disease)   . History of hiatal hernia   . Hx of cardiac arrest   . Kidney stones   . Pneumonia    3 years ago  . Thrombocytopenia (HCC)    Past Surgical History:  Procedure Laterality Date  . APPLICATION OF WOUND VAC Right 02/22/2016   Procedure: APPLICATION OF WOUND VAC;  Surgeon: Dannielle Huh, MD;  Location: [facility] OR;  Service: Orthopedics;  Laterality: Right;  . CARDIAC CATHETERIZATION N/A 02/24/2016   Procedure: Left Heart Cath  and Coronary Angiography;  Surgeon: Kathleene Hazel, MD;  Location: [facility] INVASIVE CV LAB;  Service: Cardiovascular;  Laterality: N/A;  . COLONOSCOPY    . CT SCAN of Abdomen  06/27/2012   Cirrhosis with portal hypertension. Marked slenomegaly. Bilateral inguinal hernias. Right inguinal hernia contains part of the bladder. Report from [facility]  . EAR CYST EXCISION Right 09/06/2014   Procedure: OPEN EXCISION BAKER'S CYST RIGHT KNEE;  Surgeon: Dannielle Huh, MD;  Location: [facility] OR;  Service: Orthopedics;  Laterality: Right;  . ESOPHAGEAL DILATION  1996  . ESOPHAGOGASTRODUODENOSCOPY N/A 09/17/2012   Procedure: ESOPHAGOGASTRODUODENOSCOPY (EGD);  Surgeon: Malissa Hippo, MD;  Location: [facility] ENDO SUITE;  Service: Endoscopy;  Laterality: N/A;  200  . ESOPHAGOGASTRODUODENOSCOPY N/A 12/15/2015   Procedure: ESOPHAGOGASTRODUODENOSCOPY (EGD);  Surgeon: Malissa Hippo, MD;  Location: [facility] ENDO SUITE;  Service: Endoscopy;  Laterality: N/A;  1245  . Exercise tredmill test  03/27/2005   Normal  . HERNIA REPAIR Bilateral 05/22/2013, 11/1997   x 2 (Inguinal hernia) Dr. Derrell Lolling, Dr. Achilles Dunk  . INGUINAL HERNIA REPAIR Bilateral 05/22/2013   Procedure: REPAIR OF RECURRENT INCARCERATED INGUINAL HERNIA WITH MESH RIGHT SIDE,  REPAIR OF RECURRENT INGUINAL HERNIA WITH MESH LEFT SIDE;  Surgeon: Ernestene Mention, MD;  Location: [facility] OR;  Service: General;  Laterality: Bilateral;  . INSERTION OF MESH Bilateral 05/22/2013   Procedure: INSERTION OF MESH;  Surgeon: Ernestene Mention, MD;  Location[facility] OR;  Service: General;  Laterality: Bilateral;  .  IRRIGATION AND DEBRIDEMENT KNEE Right 02/22/2016   Procedure: IRRIGATION AND DEBRIDEMENT KNEE;  Surgeon: Dannielle Huh, MD;  Location: [facility] OR;  Service: Orthopedics;  Laterality: Right;  . Irrigation and Debridement right knee  03/23/2009   Dr. Hyacinth Meeker, [facility]  . JOINT REPLACEMENT  03/16/2009   RIGHT KNEE REPLACEMENT  . KNEE ARTHROSCOPY WITH EXCISION BAKER'S CYST Right 10/01/14  .  Radionuclide Imaging Study     Normal LV systolic function with ejection of 50%; Normal myocardial perfusion without evidence of myocardial ischemia  . REPLACEMENT TOTAL KNEE     Bilateral knee replacement, 2010, 2012  . TONSILLECTOMY       MEDICATIONS:   No prescriptions prior to admission.    ALLERGIES:   Allergies  Allergen Reactions  . Aspirin Shortness Of Breath  . Penicillins Shortness Of Breath and Rash    Has patient had a PCN reaction causing immediate rash, facial/tongue/throat swelling, SOB or lightheadedness with hypotension: Yes Has patient had a PCN reaction causing severe rash involving mucus membranes or skin necrosis: No Has patient had a PCN reaction that required hospitalization No Has patient had a PCN reaction occurring within the last 10 years: No If all of the above answers are "NO", then may proceed with Cephalosporin use.   . Vancomycin Anaphylaxis    Immediately following Vancomycin administration in the OR patient Vfib arrested.    REVIEW OF SYSTEMS:  A comprehensive review of systems was negative except for: Musculoskeletal: positive for arthralgias, myalgias and stiff joints   FAMILY HISTORY:   Family History  Problem Relation Age of Onset  . Diabetes Mother     type 2, born 29  . Arthritis Father     died age 14  . GI Bleed Father     upper GI Bleed, non-ETOH cirrhosis  . Arthritis Brother   . Diabetes Brother     type 2  . Diabetes Paternal Uncle     type 2  . Heart attack Maternal Grandmother   . Heart attack Maternal Grandfather   . Colon cancer Neg Hx   . Colon polyps Neg Hx     SOCIAL HISTORY:   Social History  Substance Use Topics  . Smoking status: Former Smoker    Packs/day: 0.25    Years: 3.00    Types: Cigarettes    Quit date: 07/03/1975  . Smokeless tobacco: Never Used     Comment: none since age 22-1 pack per week when he was smoking   . Alcohol use No     EXAMINATION:  Vital signs in last 24 hours:    There  were no vitals taken for this visit.  General Appearance:    Alert, cooperative, no distress, appears stated age  Head:    Normocephalic, without obvious abnormality, atraumatic  Eyes:    PERRL, conjunctiva/corneas clear, EOM's intact, fundi    benign, both eyes       Ears:    Normal TM's and external ear canals, both ears  Nose:   Nares normal, septum midline, mucosa normal, no drainage    or sinus tenderness  Throat:   Lips, mucosa, and tongue normal; teeth and gums normal  Neck:   Supple, symmetrical, trachea midline, no adenopathy;       thyroid:  No enlargement/tenderness/nodules; no carotid   bruit or JVD  Back:     Symmetric, no curvature, ROM normal, no CVA tenderness  Lungs:     Clear to auscultation bilaterally, respirations unlabored  Chest  wall:    No tenderness or deformity  Heart:    Regular rate and rhythm, S1 and S2 normal, no murmur, rub   or gallop  Abdomen:     Soft, non-tender, bowel sounds active all four quadrants,    no masses, no organomegaly  Genitalia:    Normal male without lesion, discharge or tenderness  Rectal:    Normal tone, normal prostate, no masses or tenderness;   guaiac negative stool  Extremities:   Extremities normal, atraumatic, no cyanosis or edema  Pulses:   2+ and symmetric all extremities  Skin:   Skin color, texture, turgor normal, no rashes or lesions  Lymph nodes:   Cervical, supraclavicular, and axillary nodes normal  Neurologic:   CNII-XII intact. Normal strength, sensation and reflexes      throughout     Musculoskeletal:  ROM 0-90, Ligaments intact, warm swollen right knee with swelling around bakers cyst  Imaging Review Plain radiographs demonstrate s/p right total knee replacement Assessment/Plan: Septic Joint right knee   The patient history, physical examination and imaging studies are consistent with septic joint of the right knee. The patient has failed conservative treatment.  The clearance notes were reviewed.  After  discussion with the patient it was felt that irrigation and debridement with poly exchange vs excision arthroplasty with antibiotic spacer. The procedure,  risks, and benefits of the procedure were presented and reviewed. The risks including but not limited to aseptic loosening, infection, blood clots, vascular injury, stiffness, patella tracking problems complications among others were discussed. The patient acknowledged the explanation, agreed to proceed with the plan. Guy Sandifer 04/02/2016, 10:49 AM

## 2016-04-02 NOTE — Progress Notes (Signed)
PT Cancellation Note  Patient Details Name: Stephen Burch MRN: [id number] DOB: [birth year]   Cancelled Treatment:    Reason Eval/Treat Not Completed: Patient at procedure or test/unavailable, will follow as able.    Cassell Clement, PT, CSCS Pager [phone] Office [phone]  04/02/2016, 12:44 PM

## 2016-04-03 ENCOUNTER — Encounter (HOSPITAL_COMMUNITY): Payer: Self-pay | Admitting: Orthopedic Surgery

## 2016-04-03 DIAGNOSIS — Z881 Allergy status to other antibiotic agents status: Secondary | ICD-10-CM

## 2016-04-03 DIAGNOSIS — T8453XA Infection and inflammatory reaction due to internal right knee prosthesis, initial encounter: Secondary | ICD-10-CM

## 2016-04-03 DIAGNOSIS — A4101 Sepsis due to Methicillin susceptible Staphylococcus aureus: Secondary | ICD-10-CM

## 2016-04-03 DIAGNOSIS — B9561 Methicillin susceptible Staphylococcus aureus infection as the cause of diseases classified elsewhere: Secondary | ICD-10-CM

## 2016-04-03 DIAGNOSIS — Z886 Allergy status to analgesic agent status: Secondary | ICD-10-CM

## 2016-04-03 DIAGNOSIS — M00061 Staphylococcal arthritis, right knee: Secondary | ICD-10-CM

## 2016-04-03 DIAGNOSIS — Y792 Prosthetic and other implants, materials and accessory orthopedic devices associated with adverse incidents: Secondary | ICD-10-CM

## 2016-04-03 DIAGNOSIS — T8453XD Infection and inflammatory reaction due to internal right knee prosthesis, subsequent encounter: Secondary | ICD-10-CM

## 2016-04-03 DIAGNOSIS — M25569 Pain in unspecified knee: Secondary | ICD-10-CM

## 2016-04-03 DIAGNOSIS — Z880 Allergy status to penicillin: Secondary | ICD-10-CM

## 2016-04-03 LAB — MISC LABCORP TEST (SEND OUT): LABCORP TEST CODE: 19497

## 2016-04-03 LAB — BLOOD CULTURE ID PANEL (REFLEXED)
ACINETOBACTER BAUMANNII: NOT DETECTED
CANDIDA ALBICANS: NOT DETECTED
CANDIDA GLABRATA: NOT DETECTED
CANDIDA KRUSEI: NOT DETECTED
CANDIDA PARAPSILOSIS: NOT DETECTED
Candida tropicalis: NOT DETECTED
ENTEROBACTER CLOACAE COMPLEX: NOT DETECTED
ENTEROBACTERIACEAE SPECIES: NOT DETECTED
ENTEROCOCCUS SPECIES: NOT DETECTED
ESCHERICHIA COLI: NOT DETECTED
Haemophilus influenzae: NOT DETECTED
KLEBSIELLA OXYTOCA: NOT DETECTED
Klebsiella pneumoniae: NOT DETECTED
LISTERIA MONOCYTOGENES: NOT DETECTED
Methicillin resistance: NOT DETECTED
Neisseria meningitidis: NOT DETECTED
PSEUDOMONAS AERUGINOSA: NOT DETECTED
Proteus species: NOT DETECTED
SERRATIA MARCESCENS: NOT DETECTED
STAPHYLOCOCCUS AUREUS BCID: DETECTED — AB
STREPTOCOCCUS AGALACTIAE: NOT DETECTED
STREPTOCOCCUS PNEUMONIAE: NOT DETECTED
STREPTOCOCCUS PYOGENES: NOT DETECTED
Staphylococcus species: DETECTED — AB
Streptococcus species: NOT DETECTED

## 2016-04-03 MED ORDER — SODIUM CHLORIDE 0.9 % IV SOLN
750.0000 mg | INTRAVENOUS | Status: DC
  Administered 2016-04-04 – 2016-04-05 (×2): 750 mg via INTRAVENOUS
  Filled 2016-04-03 (×6): qty 15

## 2016-04-03 NOTE — Consult Note (Signed)
Date of Admission:  04/01/2016  Date of Consult:  04/03/2016  Reason for Consult: MSSA bacteremia and prosthetic joint infection  Referring Physician: Terrilyn Saver "auto consult" and Dr. Ronnie Derby   HPI: Stephen Burch is an 60 y.o. male with hx of cirrhosis of the liver (? Cause) had placement of a prosthetic knee proximally 10 years ago. He was complaining of pain in his right knee at the end of September and came to the emergency department. He apparently had surgery for a "Baker's cyst on 03/24/2016. During that surgery apparently had a cardiac arrest which she believes was due to vancomycin. Hip placement of a wound vacuum place at that time and did continue to have pain in the knee and was seen in the emerge department on multiple occasions. On October 1 he was seen and an aspiration was performed of a localized abscess but not the joint fluid itself and this was sent for cell count and differential as well as culture. Patient was then started on doxycycline. He unfortunately developed severe fevers and worsening knee pain and came back to the emerge department on the second and was admitted to Dr. Ruel Favors service. He was taken to the operating room and underwent incision and debridement and single staged exchange arthroplasty. He had blood cultures drawn on admission and these are growing methicillin sensitive Staphylococcus aureus. He has been on IV clindamycin due to his history of penicillin allergy which apparently causes wheezing and the alleged vancomycin allergy. He also appears to be growing  staph from his knee.      Past Medical History:  Diagnosis Date  . Liver cirrhosis [facility])     Past Surgical History:  Procedure Laterality Date  . I&D KNEE WITH POLY EXCHANGE Right 04/02/2016   Procedure: IRRIGATION AND DEBRIDEMENT RIGHT KNEE WITH POLY EXCHANGE;  Surgeon: Vickey Huger, MD;  Location: [place];  Service: Orthopedics;  Laterality: Right;  . KNEE SURGERY      Social History:   reports that he has never smoked. He has never used smokeless tobacco. He reports that he does not drink alcohol or use drugs.   No family history on file.  Allergies  Allergen Reactions  . Asa [Aspirin] Shortness Of Breath  . Penicillins Shortness Of Breath  . Vancomycin Other (See Comments)    Flat lined      Medications: I have reviewed patients current medications as documented in Epic Anti-infectives    Start     Dose/Rate Route Frequency Ordered Stop   04/03/16 1300  DAPTOmycin (CUBICIN) 750 mg in sodium chloride 0.9 % IVPB     750 mg 230 mL/hr over 30 Minutes Intravenous Every 24 hours 04/03/16 1219     04/02/16 1715  clindamycin (CLEOCIN) IVPB 900 mg  Status:  Discontinued     900 mg 100 mL/hr over 30 Minutes Intravenous Every 8 hours 04/02/16 1705 04/03/16 1219   04/02/16 0900  clindamycin (CLEOCIN) IVPB 900 mg  Status:  Discontinued     900 mg 100 mL/hr over 30 Minutes Intravenous On call to O.R. 04/02/16 0845 04/02/16 1703   04/02/16 0230  doxycycline (VIBRA-TABS) tablet 100 mg     100 mg Oral  Once 04/02/16 0229 04/02/16 0303         ROS: as in HPI otherwise remainder of 12 point Review of Systems is negative  Blood pressure 105/78, pulse 96, temperature 98.1 F (36.7 C), temperature source Oral, resp. rate 18, height 6' (1.829  m), weight 214 lb (97.1 kg), SpO2 99 %.   General: Alert and awake, oriented x3, not in any acute distress. HEENT: anicteric sclera,  EOMI, oropharynx clear and without exudate Cardiovascular: regular rate, normal r,  no murmur rubs or gallops Pulmonary: clear to auscultation bilaterally, no wheezing, rales or rhonchi Gastrointestinal: soft nontender, nondistended, normal bowel sounds, Musculoskeletal: right knee bandaged Skin, soft tissue: no rashes Neuro: nonfocal, strength and sensation intact   Results for orders placed or performed during the hospital encounter of 04/01/16 (from the past 48 hour(s))  Comprehensive metabolic  panel     Status: Abnormal   Collection Time: 04/01/16 11:54 PM  Result Value Ref Range   Sodium 137 135 - 145 mmol/L   Potassium 3.4 (L) 3.5 - 5.1 mmol/L   Chloride 104 101 - 111 mmol/L   CO2 24 22 - 32 mmol/L   Glucose, Bld 178 (H) 65 - 99 mg/dL   BUN 11 6 - 20 mg/dL   Creatinine, Ser 1.13 0.61 - 1.24 mg/dL   Calcium 8.3 (L) 8.9 - 10.3 mg/dL   Total Protein 6.5 6.5 - 8.1 g/dL   Albumin 3.7 3.5 - 5.0 g/dL   AST 38 15 - 41 U/L   ALT 41 17 - 63 U/L   Alkaline Phosphatase 101 38 - 126 U/L   Total Bilirubin 2.4 (H) 0.3 - 1.2 mg/dL   GFR calc non Af Amer >60 >60 mL/min   GFR calc Af Amer >60 >60 mL/min    Comment: (NOTE) The eGFR has been calculated using the CKD EPI equation. This calculation has not been validated in all clinical situations. eGFR's persistently <60 mL/min signify possible Chronic Kidney Disease.    Anion gap 9 5 - 15  CBC WITH DIFFERENTIAL     Status: Abnormal   Collection Time: 04/01/16 11:54 PM  Result Value Ref Range   WBC 7.8 4.0 - 10.5 K/uL   RBC 4.60 4.22 - 5.81 MIL/uL   Hemoglobin 14.0 13.0 - 17.0 g/dL   HCT 41.2 39.0 - 52.0 %   MCV 89.6 78.0 - 100.0 fL   MCH 30.4 26.0 - 34.0 pg   MCHC 34.0 30.0 - 36.0 g/dL   RDW 13.3 11.5 - 15.5 %   Platelets 63 (L) 150 - 400 K/uL    Comment: SPECIMEN CHECKED FOR CLOTS REPEATED TO VERIFY PLATELET COUNT CONFIRMED BY SMEAR    Neutrophils Relative % 81 %   Neutro Abs 6.3 1.7 - 7.7 K/uL   Lymphocytes Relative 7 %   Lymphs Abs 0.5 (L) 0.7 - 4.0 K/uL   Monocytes Relative 12 %   Monocytes Absolute 0.9 0.1 - 1.0 K/uL   Eosinophils Relative 0 %   Eosinophils Absolute 0.0 0.0 - 0.7 K/uL   Basophils Relative 0 %   Basophils Absolute 0.0 0.0 - 0.1 K/uL  Sedimentation rate     Status: None   Collection Time: 04/01/16 11:54 PM  Result Value Ref Range   Sed Rate 7 0 - 16 mm/hr  C-reactive protein     Status: Abnormal   Collection Time: 04/01/16 11:54 PM  Result Value Ref Range   CRP 13.3 (H) <1.0 mg/dL  Blood  culture (routine x 2)     Status: Abnormal (Preliminary result)   Collection Time: 04/01/16 11:54 PM  Result Value Ref Range   Specimen Description BLOOD RIGHT HAND    Special Requests BOTTLES DRAWN AEROBIC AND ANAEROBIC 5CC EA    Culture  Setup Time  GRAM POSITIVE COCCI IN CLUSTERS IN BOTH AEROBIC AND ANAEROBIC BOTTLES CRITICAL RESULT CALLED TO, READ BACK BY AND VERIFIED WITH: J. Frens Pharm.D. 11:45 04/03/16  ([email or web address])    Culture STAPHYLOCOCCUS AUREUS (A)    Report Status PENDING   Blood culture (routine x 2)     Status: None (Preliminary result)   Collection Time: 04/01/16 11:54 PM  Result Value Ref Range   Specimen Description BLOOD RIGHT ANTECUBITAL    Special Requests BOTTLES DRAWN AEROBIC AND ANAEROBIC 5CC EA    Culture  Setup Time      ANAEROBIC BOTTLE ONLY GRAM POSITIVE COCCI IN CLUSTERS CRITICAL VALUE NOTED.  VALUE IS CONSISTENT WITH PREVIOUSLY REPORTED AND CALLED VALUE.    Culture GRAM POSITIVE COCCI IN CLUSTERS    Report Status PENDING   Blood Culture ID Panel (Reflexed)     Status: Abnormal   Collection Time: 04/01/16 11:54 PM  Result Value Ref Range   Enterococcus species NOT DETECTED NOT DETECTED   Listeria monocytogenes NOT DETECTED NOT DETECTED   Staphylococcus species DETECTED (A) NOT DETECTED    Comment: CRITICAL RESULT CALLED TO, READ BACK BY AND VERIFIED WITH: J. Frens 11:45 04/03/16 ([email or web address])    Staphylococcus aureus DETECTED (A) NOT DETECTED    Comment: CRITICAL RESULT CALLED TO, READ BACK BY AND VERIFIED WITH: J. Frens 11:45 04/03/16 ([email or web address])    Methicillin resistance NOT DETECTED NOT DETECTED   Streptococcus species NOT DETECTED NOT DETECTED   Streptococcus agalactiae NOT DETECTED NOT DETECTED   Streptococcus pneumoniae NOT DETECTED NOT DETECTED   Streptococcus pyogenes NOT DETECTED NOT DETECTED   Acinetobacter baumannii NOT DETECTED NOT DETECTED   Enterobacteriaceae species NOT DETECTED NOT DETECTED   Enterobacter cloacae complex NOT DETECTED  NOT DETECTED   Escherichia coli NOT DETECTED NOT DETECTED   Klebsiella oxytoca NOT DETECTED NOT DETECTED   Klebsiella pneumoniae NOT DETECTED NOT DETECTED   Proteus species NOT DETECTED NOT DETECTED   Serratia marcescens NOT DETECTED NOT DETECTED   Haemophilus influenzae NOT DETECTED NOT DETECTED   Neisseria meningitidis NOT DETECTED NOT DETECTED   Pseudomonas aeruginosa NOT DETECTED NOT DETECTED   Candida albicans NOT DETECTED NOT DETECTED   Candida glabrata NOT DETECTED NOT DETECTED   Candida krusei NOT DETECTED NOT DETECTED   Candida parapsilosis NOT DETECTED NOT DETECTED   Candida tropicalis NOT DETECTED NOT DETECTED  Protime-INR     Status: Abnormal   Collection Time: 04/01/16 11:57 PM  Result Value Ref Range   Prothrombin Time 15.5 (H) 11.4 - 15.2 seconds   INR 1.22   I-Stat CG4 Lactic Acid, ED  (not at  [facility])     Status: None   Collection Time: 04/02/16 12:18 AM  Result Value Ref Range   Lactic Acid, Venous 1.82 0.5 - 1.9 mmol/L  Body fluid culture     Status: None (Preliminary result)   Collection Time: 04/02/16 12:42 AM  Result Value Ref Range   Specimen Description FLUID SYNOVIAL RIGHT KNEE    Special Requests NONE    Gram Stain      FEW WBC PRESENT, PREDOMINANTLY PMN NO ORGANISMS SEEN    Culture RARE STAPHYLOCOCCUS AUREUS    Report Status PENDING   Cell count + diff,  w/ cryst-synvl fld     Status: Abnormal   Collection Time: 04/02/16  1:03 AM  Result Value Ref Range   Color, Synovial YELLOW YELLOW   Appearance-Synovial TURBID (A) CLEAR   Crystals, Fluid NO CRYSTALS SEEN    WBC, Synovial 26,050 (H)  0 - 200 /cu mm   Neutrophil, Synovial 84 (H) 0 - 25 %   Lymphocytes-Synovial Fld 2 0 - 20 %   Monocyte-Macrophage-Synovial Fluid 14 (L) 50 - 90 %  Miscellaneous [facility] test (send-out)     Status: None   Collection Time: 04/02/16  1:03 AM  Result Value Ref Range   [facility] test code 19,497    [facility] test name GLUCOSE BODY FLUID    Source ([facility]) 1ML SYNOVIAL  FLUID RIGHT KNEE    Misc [facility] result COMMENT     Comment: (NOTE) Test Ordered: [id number] Glucose, Body Fluid Glucose, Body Fluid            108              mg/dL    BN   ________________________________________________________ :  Peritoneal  :       Pleural          :   Synovial     : :______________:________________________:________________: :              : Transudate :  Exudate  :                : :______________:____________:___________:________________: :  Not Estab.  :  Equal to simultaneously drawn plasma   : :______________:_________________________________________: The method performance specifications have not been established for this test in body fluid. The test result should be integrated into clinical context for interpretation. The reference intervals and other method performance specifications have not been established for this test. The test result should be integrated into the clinical context for interpretation. Performed At: [facility] [address] [place], [place] [id number] Inocente Salles [id number]   Pathologist smear review     Status: None   Collection Time: 04/02/16  1:03 AM  Result Value Ref Range   Path Review Acute inflammation.     Comment: Reviewed by Chrystie Nose. Saralyn Pilar, M.D. 04/02/16   CBC     Status: Abnormal   Collection Time: 04/02/16  8:00 AM  Result Value Ref Range   WBC 6.7 4.0 - 10.5 K/uL   RBC 4.69 4.22 - 5.81 MIL/uL   Hemoglobin 14.2 13.0 - 17.0 g/dL   HCT 42.0 39.0 - 52.0 %   MCV 89.6 78.0 - 100.0 fL   MCH 30.3 26.0 - 34.0 pg   MCHC 33.8 30.0 - 36.0 g/dL   RDW 13.5 11.5 - 15.5 %   Platelets 51 (L) 150 - 400 K/uL    Comment: REPEATED TO VERIFY SPECIMEN CHECKED FOR CLOTS CONSISTENT WITH PREVIOUS RESULT   C-reactive protein     Status: Abnormal   Collection Time: 04/02/16  8:00 AM  Result Value Ref Range   CRP 18.6 (H) <1.0 mg/dL  Sedimentation rate     Status: None   Collection Time: 04/02/16  8:00 AM    Result Value Ref Range   Sed Rate 12 0 - 16 mm/hr  Basic metabolic panel     Status: Abnormal   Collection Time: 04/02/16  8:00 AM  Result Value Ref Range   Sodium 137 135 - 145 mmol/L   Potassium 3.6 3.5 - 5.1 mmol/L   Chloride 104 101 - 111 mmol/L   CO2 25 22 - 32 mmol/L   Glucose, Bld 148 (H) 65 - 99 mg/dL   BUN 11 6 - 20 mg/dL   Creatinine, Ser 0.88 0.61 - 1.24 mg/dL   Calcium 8.4 (L) 8.9 - 10.3 mg/dL  GFR calc non Af Amer >60 >60 mL/min   GFR calc Af Amer >60 >60 mL/min    Comment: (NOTE) The eGFR has been calculated using the CKD EPI equation. This calculation has not been validated in all clinical situations. eGFR's persistently <60 mL/min signify possible Chronic Kidney Disease.    Anion gap 8 5 - 15  Surgical pcr screen     Status: Abnormal   Collection Time: 04/02/16  8:46 AM  Result Value Ref Range   MRSA, PCR NEGATIVE NEGATIVE   Staphylococcus aureus POSITIVE (A) NEGATIVE    Comment:        The Xpert SA Assay (FDA approved for NASAL specimens in patients over 62 years of age), is one component of a comprehensive surveillance program.  Test performance has been validated by [facility] for patients greater than or equal to 47 year old. It is not intended to diagnose infection nor to guide or monitor treatment.   Body fluid culture     Status: None (Preliminary result)   Collection Time: 04/02/16  2:22 PM  Result Value Ref Range   Specimen Description SYNOVIAL RIGHT KNEE    Special Requests NONE    Gram Stain      FEW WBC PRESENT, PREDOMINANTLY PMN RARE GRAM POSITIVE COCCI IN PAIRS IN CLUSTERS    Culture FEW STAPHYLOCOCCUS AUREUS    Report Status PENDING   Type and screen [place]     Status: None   Collection Time: 04/02/16  2:27 PM  Result Value Ref Range   ABO/RH(D) O POS    Antibody Screen NEG    Sample Expiration 04/05/2016   ABO/Rh     Status: None   Collection Time: 04/02/16  2:27 PM  Result Value Ref Range    ABO/RH(D) O POS    @BRIEFLABTABLE (sdes,specrequest,cult,reptstatus)   ) Recent Results (from the past 720 hour(s))  Blood culture (routine x 2)     Status: Abnormal (Preliminary result)   Collection Time: 04/01/16 11:54 PM  Result Value Ref Range Status   Specimen Description BLOOD RIGHT HAND  Final   Special Requests BOTTLES DRAWN AEROBIC AND ANAEROBIC 5CC EA  Final   Culture  Setup Time   Final    GRAM POSITIVE COCCI IN CLUSTERS IN BOTH AEROBIC AND ANAEROBIC BOTTLES CRITICAL RESULT CALLED TO, READ BACK BY AND VERIFIED WITH: J. Frens Pharm.D. 11:45 04/03/16  ([email or web address])    Culture STAPHYLOCOCCUS AUREUS (A)  Final   Report Status PENDING  Incomplete  Blood culture (routine x 2)     Status: None (Preliminary result)   Collection Time: 04/01/16 11:54 PM  Result Value Ref Range Status   Specimen Description BLOOD RIGHT ANTECUBITAL  Final   Special Requests BOTTLES DRAWN AEROBIC AND ANAEROBIC 5CC EA  Final   Culture  Setup Time   Final    ANAEROBIC BOTTLE ONLY GRAM POSITIVE COCCI IN CLUSTERS CRITICAL VALUE NOTED.  VALUE IS CONSISTENT WITH PREVIOUSLY REPORTED AND CALLED VALUE.    Culture GRAM POSITIVE COCCI IN CLUSTERS  Final   Report Status PENDING  Incomplete  Blood Culture ID Panel (Reflexed)     Status: Abnormal   Collection Time: 04/01/16 11:54 PM  Result Value Ref Range Status   Enterococcus species NOT DETECTED NOT DETECTED Final   Listeria monocytogenes NOT DETECTED NOT DETECTED Final   Staphylococcus species DETECTED (A) NOT DETECTED Final    Comment: CRITICAL RESULT CALLED TO, READ BACK BY AND VERIFIED WITH: J. Frens 11:45 04/03/16 (  [email or web address])    Staphylococcus aureus DETECTED (A) NOT DETECTED Final    Comment: CRITICAL RESULT CALLED TO, READ BACK BY AND VERIFIED WITH: J. Frens 11:45 04/03/16 ([email or web address])    Methicillin resistance NOT DETECTED NOT DETECTED Final   Streptococcus species NOT DETECTED NOT DETECTED Final   Streptococcus agalactiae NOT DETECTED NOT DETECTED Final    Streptococcus pneumoniae NOT DETECTED NOT DETECTED Final   Streptococcus pyogenes NOT DETECTED NOT DETECTED Final   Acinetobacter baumannii NOT DETECTED NOT DETECTED Final   Enterobacteriaceae species NOT DETECTED NOT DETECTED Final   Enterobacter cloacae complex NOT DETECTED NOT DETECTED Final   Escherichia coli NOT DETECTED NOT DETECTED Final   Klebsiella oxytoca NOT DETECTED NOT DETECTED Final   Klebsiella pneumoniae NOT DETECTED NOT DETECTED Final   Proteus species NOT DETECTED NOT DETECTED Final   Serratia marcescens NOT DETECTED NOT DETECTED Final   Haemophilus influenzae NOT DETECTED NOT DETECTED Final   Neisseria meningitidis NOT DETECTED NOT DETECTED Final   Pseudomonas aeruginosa NOT DETECTED NOT DETECTED Final   Candida albicans NOT DETECTED NOT DETECTED Final   Candida glabrata NOT DETECTED NOT DETECTED Final   Candida krusei NOT DETECTED NOT DETECTED Final   Candida parapsilosis NOT DETECTED NOT DETECTED Final   Candida tropicalis NOT DETECTED NOT DETECTED Final  Body fluid culture     Status: None (Preliminary result)   Collection Time: 04/02/16 12:42 AM  Result Value Ref Range Status   Specimen Description FLUID SYNOVIAL RIGHT KNEE  Final   Special Requests NONE  Final   Gram Stain   Final    FEW WBC PRESENT, PREDOMINANTLY PMN NO ORGANISMS SEEN    Culture RARE STAPHYLOCOCCUS AUREUS  Final   Report Status PENDING  Incomplete  Surgical pcr screen     Status: Abnormal   Collection Time: 04/02/16  8:46 AM  Result Value Ref Range Status   MRSA, PCR NEGATIVE NEGATIVE Final   Staphylococcus aureus POSITIVE (A) NEGATIVE Final    Comment:        The Xpert SA Assay (FDA approved for NASAL specimens in patients over 43 years of age), is one component of a comprehensive surveillance program.  Test performance has been validated by [facility] for patients greater than or equal to 72 year old. It is not intended to diagnose infection nor to guide or monitor  treatment.   Body fluid culture     Status: None (Preliminary result)   Collection Time: 04/02/16  2:22 PM  Result Value Ref Range Status   Specimen Description SYNOVIAL RIGHT KNEE  Final   Special Requests NONE  Final   Gram Stain   Final    FEW WBC PRESENT, PREDOMINANTLY PMN RARE GRAM POSITIVE COCCI IN PAIRS IN CLUSTERS    Culture FEW STAPHYLOCOCCUS AUREUS  Final   Report Status PENDING  Incomplete     Impression/Recommendation  Active Problems:   Septic joint (HCC)   Stephen Burch is a 60 y.o. male with cirrhosis of the liver, prosthetic knee, now with prosthetic joint infection and bacteremia with methicillin sensitive Staphylococcus aureus.   #1 MS SAB with PJI:     [facility] Staphylococcus aureus bacteremia   Staphylococcus aureus bacteremia (SAB) is associated with a high rate of complications and mortality.  Specific aspects of clinical management are critical to optimizing the outcome of patients with SAB.  Therefore, the [facility] Antimicrobial Management Team [facility]) has initiated an intervention aimed at improving the management of SAB  at [facility].  To do so, Infectious Diseases physicians are providing an evidence-based consult for the management of all patients with SAB.     Yes No Comments  Perform follow-up blood cultures (even if the patient is afebrile) to ensure clearance of bacteremia [x]  []  Ordered today  Remove vascular catheter and obtain follow-up blood cultures after the removal of the catheter []  []  DO NOT PLACE PICC UNTIL WE HAVE NO GROWTH ON REPEAT BLOOD CULTURES X 4 DAYS  Perform echocardiography to evaluate for endocarditis (transthoracic ECHO is 40-50% sensitive, TEE is > 90% sensitive) []  []  Please keep in mind, that neither test can definitively EXCLUDE endocarditis, and that should clinical suspicion remain high for endocarditis the patient should then still be treated with an "endocarditis" duration of therapy  = 6 weeks, 2d ECHO ORDERED       Ensure source control []  []  Have all abscesses been drained effectively? Have deep seeded infections (septic joints or osteomyelitis) had appropriate surgical debridement?  Patient has had exchange arthroplasty  Investigate for "metastatic" sites of infection []  []  Does the patient have ANY symptom or physical exam finding that would suggest a deeper infection (back or neck pain that may be suggestive of vertebral osteomyelitis or epidural abscess, muscle pain that could be a symptom of pyomyositis)?  Keep in mind that for deep seeded infections MRI imaging with contrast is preferred rather than other often insensitive tests such as plain x-rays, especially early in a patient's presentation.  Change antibiotic therapy to daptomycin. I would consider adding rifampin later BUT he has cirrhosis and I wonder if it could be from alcoholic cirrhosis [x]  []  Beta-lactam antibiotics are preferred for MSSA due to higher cure rates.   If on Vancomycin, goal trough should be 15 - 20 mcg/mL  Estimated duration of IV antibiotic therapy:  6 weeks followed by chronic oral abx x 6 months if not longer [x]  []  Consult case management for probably prolonged outpatient IV antibiotic therapy   #2 Screening: check HIV, HCV  04/03/2016, 4:18 PM   Thank you so much for this interesting consult  [place] for [place] [phone] (pager) [phone] (office) 04/03/2016, 4:18 PM  [place] 04/03/2016, 4:18 PM

## 2016-04-03 NOTE — Progress Notes (Signed)
SPORTS MEDICINE AND JOINT REPLACEMENT  Lara Mulch, MD    Carlyon Shadow, PA-C [place], [place], [place]  [address]                             [phone]   PROGRESS NOTE  Subjective:  negative for Chest Pain  negative for Shortness of Breath  negative for Nausea/Vomiting   negative for Calf Pain  negative for Bowel Movement   Tolerating Diet: yes         Patient reports pain as 4 on 0-10 scale.    Objective: Vital signs in last 24 hours:   Patient Vitals for the past 24 hrs:  BP Temp Temp src Pulse Resp SpO2  04/03/16 0400 130/72 98 F (36.7 C) Oral 83 17 99 %  04/02/16 2326 130/68 98.2 F (36.8 C) Oral 78 17 98 %  04/02/16 2242 122/72 98.2 F (36.8 C) Oral 98 17 98 %  04/02/16 1700 115/72 99.8 F (37.7 C) Oral 82 17 98 %  04/02/16 1630 120/76 97.7 F (36.5 C) - - - -  04/02/16 1615 102/75 - - - - -  04/02/16 1600 109/64 - - - - -  04/02/16 1545 106/72 - - - - -  04/02/16 1530 121/62 - - - - -  04/02/16 1515 105/76 - - - - -  04/02/16 1500 104/67 - - - - -  04/02/16 1445 (!) 152/60 97.4 F (36.3 C) - 98 (!) 21 98 %  04/02/16 1142 131/87 98.8 F (37.1 C) Oral 88 18 98 %    @flow {1959:LAST@   Intake/Output from previous day:   10/02 0701 - 10/03 0700 In: 1350 [I.V.:1300] Out: 1150 [Urine:400; Drains:550]   Intake/Output this shift:   No intake/output data recorded.   Intake/Output      10/02 0701 - 10/03 0700 10/03 0701 - 10/04 0700   I.V. (mL/kg) 1300 (13.4)    IV Piggyback 50    Total Intake(mL/kg) 1350 (13.9)    Urine (mL/kg/hr) 400 (0.2)    Drains 550 (0.2)    Blood 200 (0.1)    Total Output 1150     Net +200             LABORATORY DATA:  Recent Labs  04/01/16 2354 04/02/16 0800  WBC 7.8 6.7  HGB 14.0 14.2  HCT 41.2 42.0  PLT 63* 51*    Recent Labs  04/01/16 2354 04/02/16 0800  NA 137 137  K 3.4* 3.6  CL 104 104  CO2 24 25  BUN 11 11  CREATININE 1.13 0.88  GLUCOSE 178* 148*  CALCIUM 8.3* 8.4*   Lab Results   Component Value Date   INR 1.22 04/01/2016    Examination:  General appearance: alert, cooperative and no distress Extremities: extremities normal, atraumatic, no cyanosis or edema  Wound Exam: clean, dry, intact   Drainage:  Scant/small amount Serous exudate  Motor Exam: Quadriceps and Hamstrings Intact  Sensory Exam: Superficial Peroneal, Deep Peroneal and Tibial normal   Assessment:    1 Day Post-Op  Procedure(s) (LRB): IRRIGATION AND DEBRIDEMENT RIGHT KNEE WITH POLY EXCHANGE (Right)  ADDITIONAL DIAGNOSIS:  Active Problems:   Septic joint (HCC)     Plan: Physical Therapy as ordered Weight Bearing as Tolerated (WBAT)  DVT Prophylaxis:  Lovenox  DISCHARGE PLAN: Home  DISCHARGE NEEDS: HHPT   Patient doing well, will keep until cultures come back and infectious disease  sees the patient and recommends antibiotic treatment.          Donia Ast 04/03/2016, 7:17 AM

## 2016-04-03 NOTE — Evaluation (Signed)
Physical Therapy Evaluation Patient Details Name: Stephen Burch MRN: [id number] DOB: Nov 05, 1955 Today's Date: 04/03/2016   History of Present Illness  60 y.o. male with PMHx of Liver Cirrhosis. He underwent I&D of Baker's cyst R knee 9/23 and presented to the ED 10/1 with c/o severe R knee pain. Pt admitted for septic R knee joint and underwent I&D 10/2.  Clinical Impression  Pt admitted with above diagnosis. Pt currently with functional limitations due to the deficits listed below (see PT Problem List). On eval, pt demo mod I with bed mobility. Supervision provided for safety only with transfers and ambulation 300 feet with RW. Pt will benefit from skilled PT to increase their independence and safety with mobility to allow discharge to the venue listed below.      Follow Up Recommendations No PT follow up;Supervision - Intermittent    Equipment Recommendations  None recommended by PT    Recommendations for Other Services       Precautions / Restrictions Precautions Precautions: None Required Braces or Orthoses: Knee Immobilizer - Right Knee Immobilizer - Right: On at all times Restrictions RLE Weight Bearing: Weight bearing as tolerated      Mobility  Bed Mobility Overal bed mobility: Modified Independent                Transfers Overall transfer level: Needs assistance Equipment used: Rolling walker (2 wheeled) Transfers: Sit to/from [employer] Sit to Stand: Supervision Stand pivot transfers: Supervision       General transfer comment: No physical assist needed. No LOB noted.  Ambulation/Gait Ambulation/Gait assistance: Supervision Ambulation Distance (Feet): 300 Feet Assistive device: Rolling walker (2 wheeled) Gait Pattern/deviations: Step-to pattern;Antalgic;Decreased stride length Gait velocity: decreased Gait velocity interpretation: Below normal speed for age/gender General Gait Details: Pt demo good technique with RW.  Stairs             Wheelchair Mobility    Modified Rankin (Stroke Patients Only)       Balance Overall balance assessment: No apparent balance deficits (not formally assessed)                                           Pertinent Vitals/Pain Pain Assessment: No/denies pain    Home Living Family/patient expects to be discharged to:: Private residence Living Arrangements: Alone Available Help at Discharge: Family;Friend(s);Available 24 hours/day Type of Home: House Home Access: Stairs to enter Entrance Stairs-Rails: None Entrance Stairs-Number of Steps: 2 Home Layout: One level Home Equipment: Crutches;Walker - 2 wheels;Cane - single point Additional Comments: This is pt's 9th knee sx.    Prior Function Level of Independence: Independent               Hand Dominance        Extremity/Trunk Assessment                         Communication   Communication: No difficulties  Cognition Arousal/Alertness: Awake/alert Behavior During Therapy: WFL for tasks assessed/performed Overall Cognitive Status: Within Functional Limits for tasks assessed                      General Comments      Exercises     Assessment/Plan    PT Assessment Patient needs continued PT services  PT Problem List Decreased activity tolerance;Decreased mobility;Decreased strength;Decreased range of  motion;Pain          PT Treatment Interventions DME instruction;Gait training;Stair training;Functional mobility training;Balance training;Therapeutic exercise;Therapeutic activities;Patient/family education    PT Goals (Current goals can be found in the Care Plan section)  Acute Rehab PT Goals Patient Stated Goal: home PT Goal Formulation: With patient Time For Goal Achievement: 04/10/16 Potential to Achieve Goals: Good    Frequency Min 6X/week   Barriers to discharge        Co-evaluation               End of Session Equipment Utilized During  Treatment: Gait belt Activity Tolerance: Patient tolerated treatment well Patient left: in chair;with call bell/phone within reach Nurse Communication: Mobility status         Time: 1610-9604 PT Time Calculation (min) (ACUTE ONLY): 23 min   Charges:   PT Evaluation $PT Eval Moderate Complexity: 1 Procedure PT Treatments $Gait Training: 8-22 mins   PT G Codes:        Ilda Foil 04/03/2016, 10:58 AM

## 2016-04-03 NOTE — Op Note (Signed)
Dictation Number:  [id number]

## 2016-04-03 NOTE — Progress Notes (Signed)
PHARMACY - PHYSICIAN COMMUNICATION CRITICAL VALUE ALERT - BLOOD CULTURE IDENTIFICATION (BCID)  Results for orders placed or performed during the hospital encounter of 04/01/16  Blood Culture ID Panel (Reflexed) (Collected: 04/01/2016 11:54 PM)  Result Value Ref Range   Enterococcus species NOT DETECTED NOT DETECTED   Listeria monocytogenes NOT DETECTED NOT DETECTED   Staphylococcus species DETECTED (A) NOT DETECTED   Staphylococcus aureus DETECTED (A) NOT DETECTED   Methicillin resistance NOT DETECTED NOT DETECTED   Streptococcus species NOT DETECTED NOT DETECTED   Streptococcus agalactiae NOT DETECTED NOT DETECTED   Streptococcus pneumoniae NOT DETECTED NOT DETECTED   Streptococcus pyogenes NOT DETECTED NOT DETECTED   Acinetobacter baumannii NOT DETECTED NOT DETECTED   Enterobacteriaceae species NOT DETECTED NOT DETECTED   Enterobacter cloacae complex NOT DETECTED NOT DETECTED   Escherichia coli NOT DETECTED NOT DETECTED   Klebsiella oxytoca NOT DETECTED NOT DETECTED   Klebsiella pneumoniae NOT DETECTED NOT DETECTED   Proteus species NOT DETECTED NOT DETECTED   Serratia marcescens NOT DETECTED NOT DETECTED   Haemophilus influenzae NOT DETECTED NOT DETECTED   Neisseria meningitidis NOT DETECTED NOT DETECTED   Pseudomonas aeruginosa NOT DETECTED NOT DETECTED   Candida albicans NOT DETECTED NOT DETECTED   Candida glabrata NOT DETECTED NOT DETECTED   Candida krusei NOT DETECTED NOT DETECTED   Candida parapsilosis NOT DETECTED NOT DETECTED   Candida tropicalis NOT DETECTED NOT DETECTED    Name of physician (or Provider) Contacted: Dr. Tommy Medal and Carlyon Shadow PA-C  Changes to prescribed antibiotics required: Stop clindamycin and start Daptomycin 40m/kg day  60year old man with septic knee growing Staph aureus.  Blood cultures are not also positive with MSSA per BCID.  Patient reports allergies to penicillin and vancomycin.  Will start daptomycin today, stop clindamycin and order a  baseline CK tomorrow morning.  FCandie Mile10/08/2015  12:22 PM

## 2016-04-03 NOTE — Anesthesia Postprocedure Evaluation (Signed)
Anesthesia Post Note  Patient: Stephen Burch  Procedure(s) Performed: Procedure(s) (LRB): IRRIGATION AND DEBRIDEMENT RIGHT KNEE WITH POLY EXCHANGE (Right)  Patient location during evaluation: PACU Anesthesia Type: General Level of consciousness: awake and alert Pain management: pain level controlled Vital Signs Assessment: post-procedure vital signs reviewed and stable Respiratory status: spontaneous breathing, nonlabored ventilation, respiratory function stable and patient connected to nasal cannula oxygen Cardiovascular status: blood pressure returned to baseline and stable Postop Assessment: no signs of nausea or vomiting Anesthetic complications: no    Last Vitals:  Vitals:   04/02/16 2326 04/03/16 0400  BP: 130/68 130/72  Pulse: 78 83  Resp: 17 17  Temp: 36.8 C 36.7 C    Last Pain:  Vitals:   04/03/16 0643  TempSrc:   PainSc: 0-No pain                 Tiajuana Amass

## 2016-04-04 ENCOUNTER — Encounter (HOSPITAL_COMMUNITY): Payer: Self-pay | Admitting: Infectious Disease

## 2016-04-04 ENCOUNTER — Inpatient Hospital Stay (HOSPITAL_COMMUNITY): Payer: Medicare Other

## 2016-04-04 DIAGNOSIS — R509 Fever, unspecified: Secondary | ICD-10-CM

## 2016-04-04 DIAGNOSIS — K760 Fatty (change of) liver, not elsewhere classified: Secondary | ICD-10-CM

## 2016-04-04 DIAGNOSIS — K7581 Nonalcoholic steatohepatitis (NASH): Secondary | ICD-10-CM

## 2016-04-04 DIAGNOSIS — R2681 Unsteadiness on feet: Secondary | ICD-10-CM

## 2016-04-04 LAB — BODY FLUID CULTURE

## 2016-04-04 LAB — ECHOCARDIOGRAM COMPLETE
Height: 72 in
WEIGHTICAEL: 3424 [oz_av]

## 2016-04-04 LAB — SEDIMENTATION RATE: SED RATE: 60 mm/h — AB (ref 0–16)

## 2016-04-04 LAB — TYPE AND SCREEN
ABO/RH(D): O POS
Antibody Screen: NEGATIVE

## 2016-04-04 LAB — C-REACTIVE PROTEIN: CRP: 14.8 mg/dL — ABNORMAL HIGH (ref <1.0)

## 2016-04-04 LAB — HIV ANTIBODY (ROUTINE TESTING W REFLEX): HIV Screen 4th Generation wRfx: NONREACTIVE

## 2016-04-04 LAB — CK: CK TOTAL: 91 U/L (ref 49–397)

## 2016-04-04 NOTE — Progress Notes (Signed)
Received clearance from PA for ROM/mobility R knee. He will be ordering a CPM today. Spoke with pt re:  beginning exercises today. He wants to just use CPM this afternoon and start exercises tomorrow.  Lorrin Goodell, PT  Office # [phone] Pager [phone]

## 2016-04-04 NOTE — Progress Notes (Signed)
SPORTS MEDICINE AND JOINT REPLACEMENT  Lara Mulch, MD    Carlyon Shadow, PA-C [address], [place], [place]  [address]                             [phone]   PROGRESS NOTE  Subjective:  negative for Chest Pain  negative for Shortness of Breath  negative for Nausea/Vomiting   negative for Calf Pain  negative for Bowel Movement   Tolerating Diet: yes         Patient reports pain as 2 on 0-10 scale.    Objective: Vital signs in last 24 hours:   Patient Vitals for the past 24 hrs:  BP Temp Temp src Pulse Resp SpO2  04/04/16 0330 131/84 97.9 F (36.6 C) Oral 80 18 99 %  04/03/16 1519 105/78 98.1 F (36.7 C) Oral 96 18 99 %    @flow {1959:LAST@   Intake/Output from previous day:   10/03 0701 - 10/04 0700 In: 730 [P.O.:730] Out: 1225 [Urine:1000; Drains:225]   Intake/Output this shift:   No intake/output data recorded.   Intake/Output      10/03 0701 - 10/04 0700 10/04 0701 - 10/05 0700   P.O. 730    I.V. (mL/kg)     IV Piggyback     Total Intake(mL/kg) 730 (7.5)    Urine (mL/kg/hr) 1000 (0.4)    Drains 225 (0.1)    Blood     Total Output 1225     Net -495          Urine Occurrence 1 x       LABORATORY DATA:  Recent Labs  04/01/16 2354 04/02/16 0800  WBC 7.8 6.7  HGB 14.0 14.2  HCT 41.2 42.0  PLT 63* 51*    Recent Labs  04/01/16 2354 04/02/16 0800  NA 137 137  K 3.4* 3.6  CL 104 104  CO2 24 25  BUN 11 11  CREATININE 1.13 0.88  GLUCOSE 178* 148*  CALCIUM 8.3* 8.4*   Lab Results  Component Value Date   INR 1.22 04/01/2016   INR 1.21 02/22/2016   INR 1.00 05/13/2013    Examination:  General appearance: alert, cooperative and no distress Extremities: extremities normal, atraumatic, no cyanosis or edema  Wound Exam: clean, dry, intact   Drainage:  Small amount of bloody drainage  Motor Exam: Quadriceps and Hamstrings Intact  Sensory Exam: Superficial Peroneal, Deep Peroneal and Tibial normal   Assessment:    2 Days  Post-Op  Procedure(s) (LRB): IRRIGATION AND DEBRIDEMENT RIGHT KNEE WITH POLY EXCHANGE (Right)  ADDITIONAL DIAGNOSIS:  Active Problems:   Pyogenic arthritis of right knee joint (HCC)   Staphylococcus aureus bacteremia with sepsis (HCC)   Prosthetic joint infection, initial encounter (HCC)   Knee pain, acute     Plan: Physical Therapy as ordered Weight Bearing as Tolerated (WBAT)  DVT Prophylaxis:  Lovenox  DISCHARGE PLAN: Home  DISCHARGE NEEDS: IV Antibiotics   Patient doing well, dressing change and drain removed today. Was also present in the room for infectious disease rounds. Patient will need to have at least 3 days of clean blood cultures in order to be D/C home on PICC line for 6 week of IV antibiotics. Will continue to monitor until infectious disease is ready for D/C         Donia Ast 04/04/2016, 12:23 PM

## 2016-04-04 NOTE — Op Note (Signed)
Stephen Burch, Stephen Burch                ACCOUNT NO.:  [id number]  MEDICAL RECORD NO.:  [id number]  LOCATION:  [id number]                        FACILITY:  [facility]  PHYSICIAN:  Estill Bamberg. Ronnie Derby, M.D. DATE OF BIRTH:  1955/08/09  DATE OF PROCEDURE:  04/02/2016 DATE OF DISCHARGE:                              OPERATIVE REPORT   SURGEON:  Estill Bamberg. Ronnie Derby, M.D.  ASSISTANTLowell Guitar. Mancel Bale, PA-C  ANESTHESIA:  General.  PREOPERATIVE DIAGNOSIS:  Right septic knee with total knee replacement and posterior wound infection.  POSTOPERATIVE DIAGNOSIS:  Right septic knee with total knee replacement and posterior wound infection.  PROCEDURE:  Right open revision total knee arthroplasty, one side (polyethylene only) with open synovectomy irrigation, debridement, as well as irrigation and debridement of the posterior wound.  INDICATION FOR PROCEDURE:  The patient is a 60 year old, white male with a very complicated history.  He had a total knee replacement done elsewhere 18 years ago.  He developed recurrent Baker cyst, which I aspirated several times.  We then attempted to do a Baker cyst removal, but it recurred.  It recurred over several years and then burst and I went in to do a VAC sponge placement and he ended up having cardiac arrest on the table.  That was only 1 month ago.  He returned to the [facility] ER with septic joint and draining from the posterior wound. Informed consent obtained.  DESCRIPTION OF PROCEDURE:  The patient was laid supine, administered general anesthesia.  The right leg prepped and draped in usual fashion. I did not exsanguinate the extremity, but I did elevate the tourniquet to 300 mmHg.  I made a midline incision through the old incision and then used a new blade to make a median parapatellar arthrotomy and perform a synovectomy.  There was purulence upon entering the wound.  We did culture that before irrigating.  After very complete synovectomy and meticulous  debridement of soft tissues, I pulse lavaged 6000 mL of LR through the joint.  Interestingly, it did not seem to communicate with posterior Baker cyst incision, there was no drainage coming out posteriorly.  We therefore pulse irrigated the posterior incision, which was only one-quarter inch.  I then left a drain deep to the arthrotomy, I did remove the polyethylene and irrigated another 1000 mL of the posterior aspect of the knee, the tissue actually looked quite good back there.  I then placed a new polyethylene in the knee, closed the arthrotomy with figure-of-eight #1 PDS sutures, 0-PDS sutures, subcuticular 2-0 PDS sutures, and skin staples.  I closed the back of the knee with iodoform quarter-inch gauze x3 inches, 4 x 4s, Webril, and an Ace wrap.  COMPLICATIONS:  None.  DRAINS:  None.  TOURNIQUET TIME:  35 minutes.          ______________________________ Estill Bamberg Ronnie Derby, M.D.     SDL/MEDQ  D:  04/03/2016  T:  04/04/2016  Job:  [id number]

## 2016-04-04 NOTE — Progress Notes (Signed)
Physical Therapy Treatment Patient Details Name: Stephen Burch MRN: [id number] DOB: Jun 08, 1956 Today's Date: 04/04/2016    History of Present Illness 60 y.o. male with PMHx of Liver Cirrhosis. He underwent I&D of Baker's cyst R knee 9/23 and presented to the ED 10/1 with c/o severe R knee pain. Pt admitted for septic R knee joint and underwent I&D 10/2.    PT Comments    Updated d/c plan to OPPT to address R knee mobility, if cleared by ortho for ROM. Pt to remain in acute care pending cultures to determine appropriate antibiotic. Pt is mod I with bed mobility and transfers. Supervision provided for ambulation in hallway and stairs. Will follow up tomorrow to see if pt is cleared for ROM R knee. If pt is to remain in KI at all times, PT will d/c.  Follow Up Recommendations  Supervision - Intermittent;Outpatient PT     Equipment Recommendations  None recommended by PT    Recommendations for Other Services       Precautions / Restrictions Precautions Precautions: None Required Braces or Orthoses: Knee Immobilizer - Right Knee Immobilizer - Right: On at all times Restrictions RLE Weight Bearing: Weight bearing as tolerated Other Position/Activity Restrictions: No order in chart regarding KI wear schedule or ROM restrictions R knee. Maintaining KI at all times and no ROM until clarified by ortho.    Mobility  Bed Mobility Overal bed mobility: Modified Independent                Transfers   Equipment used: Rolling walker (2 wheeled)   Sit to Stand: Modified independent (Device/Increase time) Stand pivot transfers: Modified independent (Device/Increase time)       General transfer comment: Pt demo safe technique.  Ambulation/Gait Ambulation/Gait assistance: Supervision Ambulation Distance (Feet): 600 Feet Assistive device: Rolling walker (2 wheeled) Gait Pattern/deviations: Step-through pattern;Decreased stride length Gait velocity: decreased Gait velocity  interpretation: Below normal speed for age/gender General Gait Details: Pt demo good technique with RW.   Stairs Stairs: Yes Stairs assistance: Supervision Stair Management: No rails;Forwards;With walker Number of Stairs: 1 General stair comments: Pt has 2 steps that are deep enough to fit all 4 legs of RW.  Wheelchair Mobility    Modified Rankin (Stroke Patients Only)       Balance Overall balance assessment: No apparent balance deficits (not formally assessed)                                  Cognition Arousal/Alertness: Awake/alert Behavior During Therapy: WFL for tasks assessed/performed Overall Cognitive Status: Within Functional Limits for tasks assessed                      Exercises      General Comments        Pertinent Vitals/Pain Pain Assessment: No/denies pain    Home Living                      Prior Function            PT Goals (current goals can now be found in the care plan section) Acute Rehab PT Goals Patient Stated Goal: home PT Goal Formulation: With patient Time For Goal Achievement: 04/10/16 Potential to Achieve Goals: Good Progress towards PT goals: Progressing toward goals    Frequency    Min 6X/week      PT Plan Discharge plan needs  to be updated    Co-evaluation             End of Session Equipment Utilized During Treatment: Gait belt Activity Tolerance: Patient tolerated treatment well Patient left: in chair;with call bell/phone within reach     Time: 1040-1055 PT Time Calculation (min) (ACUTE ONLY): 15 min  Charges:  $Gait Training: 8-22 mins                    G Codes:      Ilda Foil 04/04/2016, 11:15 AM

## 2016-04-04 NOTE — Progress Notes (Signed)
  Echocardiogram 2D Echocardiogram has been performed.  Darlina Sicilian M 04/04/2016, 10:39 AM

## 2016-04-04 NOTE — Progress Notes (Signed)
Subjective: No new complaints   Antibiotics:  Anti-infectives    Start     Dose/Rate Route Frequency Ordered Stop   04/03/16 1300  DAPTOmycin (CUBICIN) 750 mg in sodium chloride 0.9 % IVPB     750 mg 230 mL/hr over 30 Minutes Intravenous Every 24 hours 04/03/16 1219     04/02/16 1715  clindamycin (CLEOCIN) IVPB 900 mg  Status:  Discontinued     900 mg 100 mL/hr over 30 Minutes Intravenous Every 8 hours 04/02/16 1705 04/03/16 1219   04/02/16 0900  clindamycin (CLEOCIN) IVPB 900 mg  Status:  Discontinued     900 mg 100 mL/hr over 30 Minutes Intravenous On call to O.R. 04/02/16 0845 04/02/16 1703   04/02/16 0230  doxycycline (VIBRA-TABS) tablet 100 mg     100 mg Oral  Once 04/02/16 0229 04/02/16 0303      Medications: Scheduled Meds: . acetaminophen  1,000 mg Oral Q6H  . DAPTOmycin (CUBICIN)  IV  750 mg Intravenous Q24H  . docusate sodium  100 mg Oral BID  . enoxaparin (LOVENOX) injection  40 mg Subcutaneous Daily  . oxyCODONE  10 mg Oral Q12H  . pantoprazole  40 mg Oral Daily   Continuous Infusions:  PRN Meds:.bisacodyl, diphenhydrAMINE, HYDROmorphone (DILAUDID) injection, methocarbamol **OR** methocarbamol (ROBAXIN)  IV, ondansetron **OR** ondansetron (ZOFRAN) IV, oxyCODONE, senna-docusate, sodium phosphate, zolpidem    Objective: Weight change:   Intake/Output Summary (Last 24 hours) at 04/04/16 1909 Last data filed at 04/04/16 1500  Gross per 24 hour  Intake              605 ml  Output              225 ml  Net              380 ml   Blood pressure 110/60, pulse 74, temperature 99.1 F (37.3 C), temperature source Oral, resp. rate 18, height 6' (1.829 m), weight 214 lb (97.1 kg), SpO2 97 %. Temp:  [97.9 F (36.6 C)-99.1 F (37.3 C)] 99.1 F (37.3 C) (10/04 1500) Pulse Rate:  [74-80] 74 (10/04 1500) Resp:  [18] 18 (10/04 1500) BP: (110-131)/(60-84) 110/60 (10/04 1500) SpO2:  [97 %-99 %] 97 % (10/04 1500)  Physical Exam: General: Alert and awake,  oriented x3, not in any acute distress. HEENT: anicteric sclera, pupils reactive to light and accommodation, EOMI CVS regular rate, normal r,  no murmur rubs or gallops Chest: clear to auscultation bilaterally, no wheezing, rales or rhonchi Abdomen: soft nontender, nondistended, normal bowel sounds, Extremities: no  clubbing or edema noted bilaterally Skin: no rashes Lymph: no new lymphadenopathy Neuro: nonfocal  CBC: @LABBLAST3 (wbc3,Hgb:3,Hct:3,Plt:3,INR:3APTT:3)@   BMET  Recent Labs  04/01/16 2354 04/02/16 0800  NA 137 137  K 3.4* 3.6  CL 104 104  CO2 24 25  GLUCOSE 178* 148*  BUN 11 11  CREATININE 1.13 0.88  CALCIUM 8.3* 8.4*     Liver Panel   Recent Labs  04/01/16 2354  PROT 6.5  ALBUMIN 3.7  AST 38  ALT 41  ALKPHOS 101  BILITOT 2.4*       Sedimentation Rate  Recent Labs  04/04/16 0524  ESRSEDRATE 60*   C-Reactive Protein  Recent Labs  04/02/16 0800 04/04/16 0524  CRP 18.6* 14.8*    Micro Results: Recent Results (from the past 720 hour(s))  Blood culture (routine x 2)     Status: Abnormal (Preliminary result)   Collection Time:  04/01/16 11:54 PM  Result Value Ref Range Status   Specimen Description BLOOD RIGHT HAND  Final   Special Requests BOTTLES DRAWN AEROBIC AND ANAEROBIC 5CC EA  Final   Culture  Setup Time   Final    GRAM POSITIVE COCCI IN CLUSTERS IN BOTH AEROBIC AND ANAEROBIC BOTTLES CRITICAL RESULT CALLED TO, READ BACK BY AND VERIFIED WITH: J. Frens Pharm.D. 11:45 04/03/16  ([email or web address])    Culture STAPHYLOCOCCUS AUREUS (A)  Final   Report Status PENDING  Incomplete  Blood culture (routine x 2)     Status: Abnormal (Preliminary result)   Collection Time: 04/01/16 11:54 PM  Result Value Ref Range Status   Specimen Description BLOOD RIGHT ANTECUBITAL  Final   Special Requests BOTTLES DRAWN AEROBIC AND ANAEROBIC 5CC EA  Final   Culture  Setup Time   Final    IN BOTH AEROBIC AND ANAEROBIC BOTTLES GRAM POSITIVE COCCI IN  CLUSTERS CRITICAL VALUE NOTED.  VALUE IS CONSISTENT WITH PREVIOUSLY REPORTED AND CALLED VALUE.    Culture STAPHYLOCOCCUS AUREUS (A)  Final   Report Status PENDING  Incomplete  Blood Culture ID Panel (Reflexed)     Status: Abnormal   Collection Time: 04/01/16 11:54 PM  Result Value Ref Range Status   Enterococcus species NOT DETECTED NOT DETECTED Final   Listeria monocytogenes NOT DETECTED NOT DETECTED Final   Staphylococcus species DETECTED (A) NOT DETECTED Final    Comment: CRITICAL RESULT CALLED TO, READ BACK BY AND VERIFIED WITH: J. Frens 11:45 04/03/16 ([email or web address])    Staphylococcus aureus DETECTED (A) NOT DETECTED Final    Comment: CRITICAL RESULT CALLED TO, READ BACK BY AND VERIFIED WITH: J. Frens 11:45 04/03/16 ([email or web address])    Methicillin resistance NOT DETECTED NOT DETECTED Final   Streptococcus species NOT DETECTED NOT DETECTED Final   Streptococcus agalactiae NOT DETECTED NOT DETECTED Final   Streptococcus pneumoniae NOT DETECTED NOT DETECTED Final   Streptococcus pyogenes NOT DETECTED NOT DETECTED Final   Acinetobacter baumannii NOT DETECTED NOT DETECTED Final   Enterobacteriaceae species NOT DETECTED NOT DETECTED Final   Enterobacter cloacae complex NOT DETECTED NOT DETECTED Final   Escherichia coli NOT DETECTED NOT DETECTED Final   Klebsiella oxytoca NOT DETECTED NOT DETECTED Final   Klebsiella pneumoniae NOT DETECTED NOT DETECTED Final   Proteus species NOT DETECTED NOT DETECTED Final   Serratia marcescens NOT DETECTED NOT DETECTED Final   Haemophilus influenzae NOT DETECTED NOT DETECTED Final   Neisseria meningitidis NOT DETECTED NOT DETECTED Final   Pseudomonas aeruginosa NOT DETECTED NOT DETECTED Final   Candida albicans NOT DETECTED NOT DETECTED Final   Candida glabrata NOT DETECTED NOT DETECTED Final   Candida krusei NOT DETECTED NOT DETECTED Final   Candida parapsilosis NOT DETECTED NOT DETECTED Final   Candida tropicalis NOT DETECTED NOT DETECTED Final  Body fluid  culture     Status: None   Collection Time: 04/02/16 12:42 AM  Result Value Ref Range Status   Specimen Description FLUID SYNOVIAL RIGHT KNEE  Final   Special Requests NONE  Final   Gram Stain   Final    FEW WBC PRESENT, PREDOMINANTLY PMN NO ORGANISMS SEEN    Culture   Final    RARE STAPHYLOCOCCUS AUREUS SUSCEPTIBILITIES PERFORMED ON PREVIOUS CULTURE WITHIN THE LAST 5 DAYS.    Report Status 04/04/2016 FINAL  Final  Surgical pcr screen     Status: Abnormal   Collection Time: 04/02/16  8:46 AM  Result Value Ref Range Status   MRSA, PCR NEGATIVE  NEGATIVE Final   Staphylococcus aureus POSITIVE (A) NEGATIVE Final    Comment:        The Xpert SA Assay (FDA approved for NASAL specimens in patients over 48 years of age), is one component of a comprehensive surveillance program.  Test performance has been validated by [facility] for patients greater than or equal to 45 year old. It is not intended to diagnose infection nor to guide or monitor treatment.   Anaerobic culture     Status: None (Preliminary result)   Collection Time: 04/02/16  2:22 PM  Result Value Ref Range Status   Specimen Description SYNOVIAL RIGHT KNEE  Final   Special Requests NONE  Final   Culture   Final    NO ANAEROBES ISOLATED; CULTURE IN PROGRESS FOR 5 DAYS   Report Status PENDING  Incomplete  Body fluid culture     Status: None   Collection Time: 04/02/16  2:22 PM  Result Value Ref Range Status   Specimen Description SYNOVIAL RIGHT KNEE  Final   Special Requests NONE  Final   Gram Stain   Final    FEW WBC PRESENT, PREDOMINANTLY PMN RARE GRAM POSITIVE COCCI IN PAIRS IN CLUSTERS    Culture FEW STAPHYLOCOCCUS AUREUS  Final   Report Status 04/04/2016 FINAL  Final   Organism ID, Bacteria STAPHYLOCOCCUS AUREUS  Final      Susceptibility   Staphylococcus aureus - MIC*    CIPROFLOXACIN <=0.5 SENSITIVE Sensitive     ERYTHROMYCIN <=0.25 SENSITIVE Sensitive     GENTAMICIN <=0.5 SENSITIVE Sensitive      OXACILLIN <=0.25 SENSITIVE Sensitive     TETRACYCLINE <=1 SENSITIVE Sensitive     VANCOMYCIN 1 SENSITIVE Sensitive     TRIMETH/SULFA >=320 RESISTANT Resistant     CLINDAMYCIN <=0.25 SENSITIVE Sensitive     RIFAMPIN <=0.5 SENSITIVE Sensitive     Inducible Clindamycin NEGATIVE Sensitive     * FEW STAPHYLOCOCCUS AUREUS  Culture, blood (Routine X 2) w Reflex to ID Panel     Status: None (Preliminary result)   Collection Time: 04/03/16  2:30 PM  Result Value Ref Range Status   Specimen Description BLOOD LEFT ARM  Final   Special Requests BOTTLES DRAWN AEROBIC AND ANAEROBIC 5CC  Final   Culture NO GROWTH < 24 HOURS  Final   Report Status PENDING  Incomplete  Culture, blood (Routine X 2) w Reflex to ID Panel     Status: None (Preliminary result)   Collection Time: 04/03/16  2:40 PM  Result Value Ref Range Status   Specimen Description BLOOD RIGHT ARM  Final   Special Requests BOTTLES DRAWN AEROBIC ONLY 5CC  Final   Culture NO GROWTH < 24 HOURS  Final   Report Status PENDING  Incomplete    Studies/Results: No results found.    Assessment/Plan:  INTERVAL HISTORY: staph from knee also is MSSA TTE done   Active Problems:   Pyogenic arthritis of right knee joint (HCC)   Staphylococcus aureus bacteremia with sepsis [facility])   Prosthetic joint infection, initial encounter ([place])   Knee pain, acute    Stephen Burch is a 60 y.o. male with   cirrhosis of the liver, prosthetic knee, now with prosthetic joint infection and bacteremia with methicillin sensitive Staphylococcus aureus.   #1 MS SAB with [facility]:  [facility] Staphylococcus aureus bacteremia  Staphylococcus aureus bacteremia (SAB) is associated with a high rate of complications and mortality.  Specific aspects of clinical management are critical to optimizing the outcome of patients with SAB.  Therefore, the [facility] Antimicrobial Management Team [facility])  has initiated an intervention aimed at improving the management of SAB at [facility].  To do so, Infectious Diseases physicians are providing an evidence-based consult for the management of all patients with SAB.     Yes No Comments  Perform follow-up blood cultures (even if the patient is afebrile) to ensure clearance of bacteremia [x]  []  04/03/16 cultures NG <24 hours  Remove vascular catheter and obtain follow-up blood cultures after the removal of the catheter []  []  DO NOT PLACE PICC UNTIL WE HAVE NO GROWTH ON REPEAT BLOOD CULTURES X 4 DAYS  Perform echocardiography to evaluate for endocarditis (transthoracic ECHO is 40-50% sensitive, TEE is > 90% sensitive) []  []  TTE WITHOUT VEGETATIONS, willl not pursure TEE       Ensure source control []  []  Have all abscesses been drained effectively? Have deep seeded infections (septic joints or osteomyelitis) had appropriate surgical debridement?  Patient has had exchange arthroplasty  Investigate for "metastatic" sites of infection []  []  Does the patient have ANY symptom or physical exam finding that would suggest a deeper infection (back or neck pain that may be suggestive of vertebral osteomyelitis or epidural abscess, muscle pain that could be a symptom of pyomyositis)?  Keep in mind that for deep seeded infections MRI imaging with contrast is preferred rather than other often insensitive tests such as plain x-rays, especially early in a patient's presentation.  Change antibiotic therapy to daptomycin. [x]  []  Beta-lactam antibiotics are preferred for MSSA due to higher cure rates.   If on Vancomycin, goal trough should be 15 - 20 mcg/mL  Estimated duration of IV antibiotic therapy:  6 weeks followed by chronic oral abx x 6 months if not longer [x]  []  Consult case management for probably prolonged outpatient IV antibiotic therapy   #2 Screening: check HIV- negative, HCV  #3 Cirrhosis: sounds based on interview with to be NAFLD     LOS: 2  days   Alcide Evener 04/04/2016, 7:09 PM

## 2016-04-05 LAB — CULTURE, BLOOD (ROUTINE X 2)

## 2016-04-05 LAB — HEPATITIS C ANTIBODY (REFLEX)

## 2016-04-05 LAB — HCV COMMENT:

## 2016-04-05 MED ORDER — CHLORPROMAZINE HCL 25 MG PO TABS
25.0000 mg | ORAL_TABLET | Freq: Three times a day (TID) | ORAL | Status: DC | PRN
  Administered 2016-04-05 (×2): 25 mg via ORAL
  Administered 2016-04-06: 50 mg via ORAL
  Filled 2016-04-05 (×4): qty 2

## 2016-04-05 NOTE — Progress Notes (Signed)
[place] pt for [facility] pharmacy services at this admission  [facility] will provide Home Infusion Pharmacy services for home IV ABX as ordered at DC. [facility] will support and work with patient's [place] of choice.  We will follow pt until DC to ensure HH/ Infusion needs are met for home.   If patient discharges after hours, please call [phone].   Larry Sierras 04/05/2016, 2:43 PM

## 2016-04-05 NOTE — Progress Notes (Signed)
Physical Therapy Treatment Patient Details Name: Stephen Burch MRN: [id number] DOB: Aug 06, 1955 Today's Date: 04/05/2016    History of Present Illness 60 y.o. male with PMHx of Liver Cirrhosis. He underwent I&D of Baker's cyst R knee 9/23 and presented to the ED 10/1 with c/o severe R knee pain. Pt admitted for septic R knee joint and underwent I&D 10/2.    PT Comments    Patient led through LE therex and given handout. Patient needs to practice stairs next session. Current plan remains appropriate.   Follow Up Recommendations  Supervision - Intermittent;Outpatient PT     Equipment Recommendations  None recommended by PT    Recommendations for Other Services       Precautions / Restrictions Precautions Precautions: None Restrictions Weight Bearing Restrictions: Yes RLE Weight Bearing: Weight bearing as tolerated    Mobility  Bed Mobility               General bed mobility comments: pt OOB in chair upon arrival  Transfers                    Ambulation/Gait                 Stairs            Wheelchair Mobility    Modified Rankin (Stroke Patients Only)       Balance                                    Cognition Arousal/Alertness: Awake/alert Behavior During Therapy: WFL for tasks assessed/performed Overall Cognitive Status: Within Functional Limits for tasks assessed                      Exercises General Exercises - Lower Extremity Ankle Circles/Pumps: AROM;Both;15 reps;Seated Quad Sets: AROM;Right;20 reps;Seated Long Arc Quad: AAROM;Right;15 reps;Seated Heel Slides: AROM;Right;15 reps;Seated Hip ABduction/ADduction: AROM;Right;20 reps;Seated Straight Leg Raises: AAROM;Right;Seated;10 reps    General Comments        Pertinent Vitals/Pain Pain Assessment: Faces Faces Pain Scale: Hurts whole lot Pain Location: R knee Pain Descriptors / Indicators: Grimacing;Sore Pain Intervention(s): Limited  activity within patient's tolerance;Monitored during session;Premedicated before session;Repositioned    Home Living                      Prior Function            PT Goals (current goals can now be found in the care plan section) Acute Rehab PT Goals Patient Stated Goal: home Progress towards PT goals: Progressing toward goals    Frequency    Min 6X/week      PT Plan Current plan remains appropriate    Co-evaluation             End of Session   Activity Tolerance: Patient tolerated treatment well Patient left: in chair;with call bell/phone within reach;with family/visitor present     Time: 6568-1275 PT Time Calculation (min) (ACUTE ONLY): 18 min  Charges:  $Therapeutic Exercise: 8-22 mins                    G Codes:      [place] April, PTA Pager: [phone]   04/05/2016, 3:15 PM

## 2016-04-05 NOTE — Progress Notes (Signed)
Orthopedic Tech Progress Note Patient Details:  Stephen Burch 10-31-1955 [id number]  CPM Right Knee CPM Right Knee: On Right Knee Flexion (Degrees): 45 Right Knee Extension (Degrees): 0   [name] 04/05/2016, 7:10 AM

## 2016-04-05 NOTE — Care Management Important Message (Signed)
Important Message  Patient Details  Name: Stephen Burch MRN: [id number] Date of Birth: May 10, 1956   Medicare Important Message Given:  Yes    Cherylyn Sundby 04/05/2016, 12:17 PM

## 2016-04-05 NOTE — Progress Notes (Signed)
SPORTS MEDICINE AND JOINT REPLACEMENT  Lara Mulch, MD    Carlyon Shadow, PA-C [address], [place], [place]  [address]                             [phone]   PROGRESS NOTE  Subjective:  negative for Chest Pain  negative for Shortness of Breath  negative for Nausea/Vomiting   negative for Calf Pain  positive for Bowel Movement   Tolerating Diet: yes         Patient reports pain as 3 on 0-10 scale.    Objective: Vital signs in last 24 hours:   Patient Vitals for the past 24 hrs:  BP Temp Temp src Pulse Resp SpO2  04/05/16 0518 130/72 98.3 F (36.8 C) Oral 76 16 100 %  04/04/16 2040 106/67 99.6 F (37.6 C) Oral 83 16 98 %  04/04/16 1500 110/60 99.1 F (37.3 C) Oral 74 18 97 %    @flow {1959:LAST@   Intake/Output from previous day:   10/04 0701 - 10/05 0700 In: 605 [P.O.:490] Out: -    Intake/Output this shift:   10/05 0701 - 10/05 1900 In: 240 [P.O.:240] Out: -    Intake/Output      10/04 0701 - 10/05 0700 10/05 0701 - 10/06 0700   P.O. 490 240   IV Piggyback 115    Total Intake(mL/kg) 605 (6.2) 240 (2.5)   Urine (mL/kg/hr)     Drains     Total Output       Net +605 +240        Urine Occurrence 3 x 1 x      LABORATORY DATA:  Recent Labs  04/01/16 2354 04/02/16 0800  WBC 7.8 6.7  HGB 14.0 14.2  HCT 41.2 42.0  PLT 63* 51*    Recent Labs  04/01/16 2354 04/02/16 0800  NA 137 137  K 3.4* 3.6  CL 104 104  CO2 24 25  BUN 11 11  CREATININE 1.13 0.88  GLUCOSE 178* 148*  CALCIUM 8.3* 8.4*   Lab Results  Component Value Date   INR 1.22 04/01/2016   INR 1.21 02/22/2016   INR 1.00 05/13/2013    Examination:  General appearance: alert, cooperative and no distress Extremities: extremities normal, atraumatic, no cyanosis or edema  Wound Exam: clean, dry, intact   Drainage:  None: wound tissue dry  Motor Exam: Quadriceps and Hamstrings Intact  Sensory Exam: Superficial Peroneal, Deep Peroneal and Tibial normal   Assessment:     3 Days Post-Op  Procedure(s) (LRB): IRRIGATION AND DEBRIDEMENT RIGHT KNEE WITH POLY EXCHANGE (Right)  ADDITIONAL DIAGNOSIS:  Active Problems:   Pyogenic arthritis of right knee joint (HCC)   Staphylococcus aureus bacteremia with sepsis (HCC)   Prosthetic joint infection, initial encounter (HCC)   Knee pain, acute   NAFLD (nonalcoholic fatty liver disease)   Unsteadiness on feet     Plan: Physical Therapy as ordered Weight Bearing as Tolerated (WBAT)  DVT Prophylaxis:  Lovenox  DISCHARGE PLAN: Home  DISCHARGE NEEDS: IV Antibiotics   Patient is doing well and is very comfortable. The wick from the bakers cyst was removed today and a new dressing was applied. Continue PT and WBAT. We are waiting for blood cultures to show no growth x 3-4 days so patient can be D/C on PICC line for home health to give IV antibiotic treatment for 6 weeks. From an orthopedic standpoint he is doing  excellent and is ready for D/C whenever cleared by infectious disease.          Donia Ast 04/05/2016, 12:35 PM

## 2016-04-06 MED ORDER — OXYCODONE HCL 5 MG PO TABS: 5.0000 mg | ORAL_TABLET | ORAL | 0 refills | Status: DC | PRN

## 2016-04-06 MED ORDER — METHOCARBAMOL 500 MG PO TABS: 500.0000 mg | ORAL_TABLET | Freq: Four times a day (QID) | ORAL | 0 refills | Status: DC | PRN

## 2016-04-06 MED ORDER — ONDANSETRON HCL 4 MG PO TABS: 4.0000 mg | ORAL_TABLET | Freq: Four times a day (QID) | ORAL | 0 refills | Status: DC | PRN

## 2016-04-06 MED ORDER — CEFAZOLIN IN D5W 1 GM/50ML IV SOLN
1.0000 g | Freq: Three times a day (TID) | INTRAVENOUS | Status: DC
  Administered 2016-04-06 – 2016-04-07 (×3): 1 g via INTRAVENOUS
  Filled 2016-04-06 (×5): qty 50

## 2016-04-06 MED ORDER — ENOXAPARIN SODIUM 40 MG/0.4ML ~~LOC~~ SOLN: 40.0000 mg | SUBCUTANEOUS | 0 refills | Status: DC

## 2016-04-06 NOTE — Progress Notes (Signed)
Physical Therapy Treatment Patient Details Name: Stephen Burch MRN: [id number] DOB: Nov 04, 1955 Today's Date: 04/06/2016    History of Present Illness 60 y.o. male with PMHx of Liver Cirrhosis. He underwent I&D of Baker's cyst R knee 9/23 and presented to the ED 10/1 with c/o severe R knee pain. Pt admitted for septic R knee joint and underwent I&D 10/2.    PT Comments    Pt progressing well, performed multiple bouts of stair training in edition to therapeutic exercise.    Follow Up Recommendations  Supervision - Intermittent;Outpatient PT     Equipment Recommendations  None recommended by PT    Recommendations for Other Services       Precautions / Restrictions Precautions Precautions: None Restrictions Weight Bearing Restrictions: Yes RLE Weight Bearing: Weight bearing as tolerated    Mobility  Bed Mobility               General bed mobility comments: Pt sitting on futon in room on arrival.    Transfers Overall transfer level: Modified independent Equipment used: Rolling walker (2 wheeled)   Sit to Stand: Modified independent (Device/Increase time) Stand pivot transfers: Modified independent (Device/Increase time)       General transfer comment: Pt demo safe technique.  Ambulation/Gait Ambulation/Gait assistance: Supervision (for gait deviations not assistance.  ) Ambulation Distance (Feet): 650 Feet Assistive device: Rolling walker (2 wheeled) Gait Pattern/deviations: Step-through pattern;Decreased stance time - right;Trunk flexed Gait velocity: decreased   General Gait Details: Assisted patient with adjustment of RW height to improve upper trunk control, cues for weight bearing and relaxing shoulders to encourage weight bearing and reduce reliance of UEs.     Stairs Stairs: Yes Stairs assistance: Supervision Stair Management: Two rails;No rails;With walker Number of Stairs: 10 (x4 with B rails and x6 with RW and no rails to simulate curb height  negotiation.  ) General stair comments: Pt required cues for sequencing and use of device and/or rails.    Wheelchair Mobility    Modified Rankin (Stroke Patients Only)       Balance Overall balance assessment: No apparent balance deficits (not formally assessed)                                  Cognition Arousal/Alertness: Awake/alert Behavior During Therapy: WFL for tasks assessed/performed Overall Cognitive Status: Within Functional Limits for tasks assessed                      Exercises Total Joint Exercises Ankle Circles/Pumps: AROM;Right;10 reps;Supine Heel Slides: AAROM;Right;10 reps;Seated Hip ABduction/ADduction: AROM;Right;10 reps;Standing Long Arc Quad: AROM;Right;10 reps;Seated Knee Flexion: AROM;Right;10 reps;Seated    General Comments        Pertinent Vitals/Pain Pain Assessment: 0-10 Pain Score: 8  Pain Location: R knee Pain Descriptors / Indicators: Sore Pain Intervention(s): Monitored during session;Repositioned;Ice applied    Home Living                      Prior Function            PT Goals (current goals can now be found in the care plan section) Acute Rehab PT Goals Patient Stated Goal: home Potential to Achieve Goals: Good Progress towards PT goals: Progressing toward goals    Frequency    Min 6X/week      PT Plan Current plan remains appropriate    Co-evaluation  End of Session Equipment Utilized During Treatment: Gait belt Activity Tolerance: Patient tolerated treatment well Patient left: in chair;with call bell/phone within reach;with family/visitor present     Time: 7253-6644 PT Time Calculation (min) (ACUTE ONLY): 24 min  Charges:  $Gait Training: 8-22 mins $Therapeutic Exercise: 8-22 mins                    G Codes:      Florestine Avers April 19, 2016, 3:23 PM Joycelyn Rua, PTA pager [phone]

## 2016-04-06 NOTE — Progress Notes (Signed)
Pharmacy Antibiotic Note  Stephen Burch is a 60 y.o. male admitted on 04/01/2016 with bacteremia.  Pharmacy has been consulted for cefazolin dosing.  Plan: Discontinue daptomycin Start cefazolin 1 gram q 8 hours Monitor clinical progress, c/s, renal function, abx plan/LOT   Height: 6' (182.9 cm) Weight: 214 lb (97.1 kg) IBW/kg (Calculated) : 77.6  Temp (24hrs), Avg:99.3 F (37.4 C), Min:98.6 F (37 C), Max:99.7 F (37.6 C)   Recent Labs Lab 04/01/16 2354 04/02/16 0018 04/02/16 0800  WBC 7.8  --  6.7  CREATININE 1.13  --  0.88  LATICACIDVEN  --  1.82  --     Estimated Creatinine Clearance: 105.1 mL/min (by C-G formula based on SCr of 0.88 mg/dL).    Allergies  Allergen Reactions  . Asa [Aspirin] Shortness Of Breath  . Penicillins Shortness Of Breath and Rash    Has patient had a PCN reaction causing immediate rash, facial/tongue/throat swelling, SOB or lightheadedness with hypotension: Yes Has patient had a PCN reaction causing severe rash involving mucus membranes or skin necrosis: No Has patient had a PCN reaction that required hospitalization No Has patient had a PCN reaction occurring within the last 10 years: No If all of the above answers are "NO", then may proceed with Cephalosporin use.   . Vancomycin Anaphylaxis    Immediately following Vancomycin administration in the OR patient Vfib arrested.    Antimicrobials this admission: 10/1 clinda >> 10/3 10/3 dapto >> 10/5 10/6 cefazolin>>   Microbiology results: 10/3 BCx: ngtd 10/1 BCx: MSSA (bcid) 10/2: knee fluid - staph aureus 10/1 MRSA PCR: Neg (positive for MSSA)   Thank you for allowing [place] to participate in this patients care. Jens Som, PharmD Pager: [phone] 04/06/2016 2:51 PM

## 2016-04-06 NOTE — Care Management Note (Addendum)
Case Management Note  Patient Details  Name: Stephen Burch MRN: [id number] Date of Birth: [birth year]  Subjective/Objective:   60 yr old gentleman s/p I & D of right septic knee with poly exchange.                 Action/Plan: Case manager spoke with patient concerning need for Home Health RN for IV antibiotics. Patient will get PICC line when Infectious disease clears him. Patient has been setup with [place] for IV antibiotics and for [facility]. Case manager gave referral to [facility] IV Specialist. Patient has rolling waler and 3in1 from previous surgery, he will have family support at discharge.   Expected Discharge Date:   to be determined               Expected Discharge Plan:  [place]  In-House Referral:     Discharge planning Services  CM Consult  Post Acute Care Choice:  Home Health, Durable Medical Equipment Choice offered to:  Patient  DME Arranged:  IV pump/equipment DME Agency:  [place]:  RN [facility] Agency:  [place]  Status of Service:  In process, will continue to follow  If discussed at Long Length of Stay Meetings, dates discussed:    Additional Comments:  Ninfa Meeker, RN 04/06/2016, 3:22 PM

## 2016-04-06 NOTE — Progress Notes (Signed)
Subjective: No new complaints   Antibiotics:  Anti-infectives    Start     Dose/Rate Route Frequency Ordered Stop   04/06/16 1500  ceFAZolin (ANCEF) IVPB 1 g/50 mL premix     1 g 100 mL/hr over 30 Minutes Intravenous Every 8 hours 04/06/16 1449     04/03/16 1300  DAPTOmycin (CUBICIN) 750 mg in sodium chloride 0.9 % IVPB  Status:  Discontinued     750 mg 230 mL/hr over 30 Minutes Intravenous Every 24 hours 04/03/16 1219 04/06/16 1335   04/02/16 1715  clindamycin (CLEOCIN) IVPB 900 mg  Status:  Discontinued     900 mg 100 mL/hr over 30 Minutes Intravenous Every 8 hours 04/02/16 1705 04/03/16 1219   04/02/16 0900  clindamycin (CLEOCIN) IVPB 900 mg  Status:  Discontinued     900 mg 100 mL/hr over 30 Minutes Intravenous On call to O.R. 04/02/16 0845 04/02/16 1703   04/02/16 0230  doxycycline (VIBRA-TABS) tablet 100 mg     100 mg Oral  Once 04/02/16 0229 04/02/16 0303      Medications: Scheduled Meds: . acetaminophen  1,000 mg Oral Q6H  .  ceFAZolin (ANCEF) IV  1 g Intravenous Q8H  . docusate sodium  100 mg Oral BID  . enoxaparin (LOVENOX) injection  40 mg Subcutaneous Daily  . oxyCODONE  10 mg Oral Q12H  . pantoprazole  40 mg Oral Daily   Continuous Infusions:  PRN Meds:.bisacodyl, chlorproMAZINE, diphenhydrAMINE, HYDROmorphone (DILAUDID) injection, methocarbamol **OR** methocarbamol (ROBAXIN)  IV, ondansetron **OR** ondansetron (ZOFRAN) IV, oxyCODONE, senna-docusate, sodium phosphate, zolpidem    Objective: Weight change:   Intake/Output Summary (Last 24 hours) at 04/06/16 2021 Last data filed at 04/06/16 1537  Gross per 24 hour  Intake              290 ml  Output                0 ml  Net              290 ml   Blood pressure 97/66, pulse 94, temperature 98.6 F (37 C), temperature source Oral, resp. rate 20, height 6' (1.829 m), weight 214 lb (97.1 kg), SpO2 96 %. Temp:  [98.6 F (37 C)-99.7 F (37.6 C)] 98.6 F (37 C) (10/06 1332) Pulse Rate:  [94-102]  94 (10/06 1332) Resp:  [20] 20 (10/06 1332) BP: (97-116)/(66-77) 97/66 (10/06 1332) SpO2:  [96 %] 96 % (10/06 1332)  Physical Exam: General: Alert and awake, oriented x3, not in any acute distress. HEENT: anicteric sclera, pupils reactive to light and accommodation, EOMI CVS regular rate, normal r,  no murmur rubs or gallops Chest: clear to auscultation bilaterally, no wheezing, rales or rhonchi Abdomen: soft nontender, nondistended, normal bowel sounds, Extremities: dressing in place Skin: no rashes Neuro: nonfocal  CBC:  CBC Latest Ref Rng & Units 04/02/2016 04/01/2016 03/04/2016  WBC 4.0 - 10.5 K/uL 6.7 7.8 4.9  Hemoglobin 13.0 - 17.0 g/dL 14.2 14.0 15.5  Hematocrit 39.0 - 52.0 % 42.0 41.2 43.7  Platelets 150 - 400 K/uL 51(L) 63(L) 105(L)      BMET No results for input(s): NA, K, CL, CO2, GLUCOSE, BUN, CREATININE, CALCIUM in the last 72 hours.   Liver Panel  No results for input(s): PROT, ALBUMIN, AST, ALT, ALKPHOS, BILITOT, BILIDIR, IBILI in the last 72 hours.     Sedimentation Rate  Recent Labs  04/04/16 0524  ESRSEDRATE 60*   C-Reactive  Protein  Recent Labs  04/04/16 0524  CRP 14.8*    Micro Results: Recent Results (from the past 720 hour(s))  Blood culture (routine x 2)     Status: Abnormal   Collection Time: 04/01/16 11:54 PM  Result Value Ref Range Status   Specimen Description BLOOD RIGHT HAND  Final   Special Requests BOTTLES DRAWN AEROBIC AND ANAEROBIC 5CC EA  Final   Culture  Setup Time   Final    GRAM POSITIVE COCCI IN CLUSTERS IN BOTH AEROBIC AND ANAEROBIC BOTTLES CRITICAL RESULT CALLED TO, READ BACK BY AND VERIFIED WITH: J. Frens Pharm.D. 11:45 04/03/16  ([email or web address])    Culture STAPHYLOCOCCUS AUREUS (A)  Final   Report Status 04/05/2016 FINAL  Final   Organism ID, Bacteria STAPHYLOCOCCUS AUREUS  Final      Susceptibility   Staphylococcus aureus - MIC*    CIPROFLOXACIN <=0.5 SENSITIVE Sensitive     ERYTHROMYCIN 0.5 SENSITIVE Sensitive      GENTAMICIN <=0.5 SENSITIVE Sensitive     OXACILLIN <=0.25 SENSITIVE Sensitive     TETRACYCLINE <=1 SENSITIVE Sensitive     VANCOMYCIN <=0.5 SENSITIVE Sensitive     TRIMETH/SULFA >=320 RESISTANT Resistant     CLINDAMYCIN <=0.25 SENSITIVE Sensitive     RIFAMPIN <=0.5 SENSITIVE Sensitive     Inducible Clindamycin NEGATIVE Sensitive     * STAPHYLOCOCCUS AUREUS  Blood culture (routine x 2)     Status: Abnormal   Collection Time: 04/01/16 11:54 PM  Result Value Ref Range Status   Specimen Description BLOOD RIGHT ANTECUBITAL  Final   Special Requests BOTTLES DRAWN AEROBIC AND ANAEROBIC 5CC EA  Final   Culture  Setup Time   Final    IN BOTH AEROBIC AND ANAEROBIC BOTTLES GRAM POSITIVE COCCI IN CLUSTERS CRITICAL VALUE NOTED.  VALUE IS CONSISTENT WITH PREVIOUSLY REPORTED AND CALLED VALUE.    Culture (A)  Final    STAPHYLOCOCCUS AUREUS SUSCEPTIBILITIES PERFORMED ON PREVIOUS CULTURE WITHIN THE LAST 5 DAYS.    Report Status 04/05/2016 FINAL  Final  Blood Culture ID Panel (Reflexed)     Status: Abnormal   Collection Time: 04/01/16 11:54 PM  Result Value Ref Range Status   Enterococcus species NOT DETECTED NOT DETECTED Final   Listeria monocytogenes NOT DETECTED NOT DETECTED Final   Staphylococcus species DETECTED (A) NOT DETECTED Final    Comment: CRITICAL RESULT CALLED TO, READ BACK BY AND VERIFIED WITH: J. Frens 11:45 04/03/16 ([email or web address])    Staphylococcus aureus DETECTED (A) NOT DETECTED Final    Comment: CRITICAL RESULT CALLED TO, READ BACK BY AND VERIFIED WITH: J. Frens 11:45 04/03/16 ([email or web address])    Methicillin resistance NOT DETECTED NOT DETECTED Final   Streptococcus species NOT DETECTED NOT DETECTED Final   Streptococcus agalactiae NOT DETECTED NOT DETECTED Final   Streptococcus pneumoniae NOT DETECTED NOT DETECTED Final   Streptococcus pyogenes NOT DETECTED NOT DETECTED Final   Acinetobacter baumannii NOT DETECTED NOT DETECTED Final   Enterobacteriaceae species NOT DETECTED NOT  DETECTED Final   Enterobacter cloacae complex NOT DETECTED NOT DETECTED Final   Escherichia coli NOT DETECTED NOT DETECTED Final   Klebsiella oxytoca NOT DETECTED NOT DETECTED Final   Klebsiella pneumoniae NOT DETECTED NOT DETECTED Final   Proteus species NOT DETECTED NOT DETECTED Final   Serratia marcescens NOT DETECTED NOT DETECTED Final   Haemophilus influenzae NOT DETECTED NOT DETECTED Final   Neisseria meningitidis NOT DETECTED NOT DETECTED Final   Pseudomonas aeruginosa NOT DETECTED NOT DETECTED Final   Candida albicans NOT  DETECTED NOT DETECTED Final   Candida glabrata NOT DETECTED NOT DETECTED Final   Candida krusei NOT DETECTED NOT DETECTED Final   Candida parapsilosis NOT DETECTED NOT DETECTED Final   Candida tropicalis NOT DETECTED NOT DETECTED Final  Body fluid culture     Status: None   Collection Time: 04/02/16 12:42 AM  Result Value Ref Range Status   Specimen Description FLUID SYNOVIAL RIGHT KNEE  Final   Special Requests NONE  Final   Gram Stain   Final    FEW WBC PRESENT, PREDOMINANTLY PMN NO ORGANISMS SEEN    Culture   Final    RARE STAPHYLOCOCCUS AUREUS SUSCEPTIBILITIES PERFORMED ON PREVIOUS CULTURE WITHIN THE LAST 5 DAYS.    Report Status 04/04/2016 FINAL  Final  Surgical pcr screen     Status: Abnormal   Collection Time: 04/02/16  8:46 AM  Result Value Ref Range Status   MRSA, PCR NEGATIVE NEGATIVE Final   Staphylococcus aureus POSITIVE (A) NEGATIVE Final    Comment:        The Xpert SA Assay (FDA approved for NASAL specimens in patients over 76 years of age), is one component of a comprehensive surveillance program.  Test performance has been validated by [facility] for patients greater than or equal to 33 year old. It is not intended to diagnose infection nor to guide or monitor treatment.   Anaerobic culture     Status: None (Preliminary result)   Collection Time: 04/02/16  2:22 PM  Result Value Ref Range Status   Specimen Description  SYNOVIAL RIGHT KNEE  Final   Special Requests NONE  Final   Culture   Final    NO ANAEROBES ISOLATED; CULTURE IN PROGRESS FOR 5 DAYS   Report Status PENDING  Incomplete  Body fluid culture     Status: None   Collection Time: 04/02/16  2:22 PM  Result Value Ref Range Status   Specimen Description SYNOVIAL RIGHT KNEE  Final   Special Requests NONE  Final   Gram Stain   Final    FEW WBC PRESENT, PREDOMINANTLY PMN RARE GRAM POSITIVE COCCI IN PAIRS IN CLUSTERS    Culture FEW STAPHYLOCOCCUS AUREUS  Final   Report Status 04/04/2016 FINAL  Final   Organism ID, Bacteria STAPHYLOCOCCUS AUREUS  Final      Susceptibility   Staphylococcus aureus - MIC*    CIPROFLOXACIN <=0.5 SENSITIVE Sensitive     ERYTHROMYCIN <=0.25 SENSITIVE Sensitive     GENTAMICIN <=0.5 SENSITIVE Sensitive     OXACILLIN <=0.25 SENSITIVE Sensitive     TETRACYCLINE <=1 SENSITIVE Sensitive     VANCOMYCIN 1 SENSITIVE Sensitive     TRIMETH/SULFA >=320 RESISTANT Resistant     CLINDAMYCIN <=0.25 SENSITIVE Sensitive     RIFAMPIN <=0.5 SENSITIVE Sensitive     Inducible Clindamycin NEGATIVE Sensitive     * FEW STAPHYLOCOCCUS AUREUS  Culture, blood (Routine X 2) w Reflex to ID Panel     Status: None (Preliminary result)   Collection Time: 04/03/16  2:30 PM  Result Value Ref Range Status   Specimen Description BLOOD LEFT ARM  Final   Special Requests BOTTLES DRAWN AEROBIC AND ANAEROBIC 5CC  Final   Culture NO GROWTH 3 DAYS  Final   Report Status PENDING  Incomplete  Culture, blood (Routine X 2) w Reflex to ID Panel     Status: None (Preliminary result)   Collection Time: 04/03/16  2:40 PM  Result Value Ref Range Status   Specimen Description BLOOD  RIGHT ARM  Final   Special Requests BOTTLES DRAWN AEROBIC ONLY 5CC  Final   Culture NO GROWTH 3 DAYS  Final   Report Status PENDING  Incomplete    Studies/Results: No results found.    Assessment/Plan:  INTERVAL HISTORY: staph from knee also is MSSA TTE done no vegetations  on valves  Cubicin will cost pt 220 dollar per day   Active Problems:   Pyogenic arthritis of right knee joint (HCC)   Staphylococcus aureus bacteremia with sepsis (HCC)   Prosthetic joint infection, initial encounter (HCC)   Knee pain, acute   NAFLD (nonalcoholic fatty liver disease)   Unsteadiness on feet    Stephen Burch is a 60 y.o. male with   cirrhosis of the liver, prosthetic knee, now with prosthetic joint infection and bacteremia with methicillin sensitive Staphylococcus aureus.   #1 MS SAB with PJI: Due to HIGH cost of cubicin once again revisited his chldhood allergy of PCN causing asthma. This was NEVER throat swelling or anaphylactic reaction but rather a connection he believed between his asthma and PCN. We decided to try cehalosporin                                           [facility] Staphylococcus aureus bacteremia  Staphylococcus aureus bacteremia (SAB) is associated with a high rate of complications and mortality.  Specific aspects of clinical management are critical to optimizing the outcome of patients with SAB.  Therefore, the [facility] Antimicrobial Management Team [facility]) has initiated an intervention aimed at improving the management of SAB at [facility].  To do so, Infectious Diseases physicians are providing an evidence-based consult for the management of all patients with SAB.     Yes No Comments  Perform follow-up blood cultures (even if the patient is afebrile) to ensure clearance of bacteremia [x]  []  04/03/16 cultures NG ROWTH  Remove vascular catheter and obtain follow-up blood cultures after the removal of the catheter []  []  DO NOT PLACE PICC UNTIL WE HAVE NO GROWTH ON REPEAT BLOOD CULTURES X 4 DAYS  Perform echocardiography to evaluate for endocarditis (transthoracic ECHO is 40-50% sensitive, TEE is > 90% sensitive) []  []  TTE WITHOUT VEGETATIONS, willl not pursure TEE       Ensure source control []  []  Have all  abscesses been drained effectively? Have deep seeded infections (septic joints or osteomyelitis) had appropriate surgical debridement?  Patient has had exchange arthroplasty  Investigate for "metastatic" sites of infection []  []  Does the patient have ANY symptom or physical exam finding that would suggest a deeper infection (back or neck pain that may be suggestive of vertebral osteomyelitis or epidural abscess, muscle pain that could be a symptom of pyomyositis)?  Keep in mind that for deep seeded infections MRI imaging with contrast is preferred rather than other often insensitive tests such as plain x-rays, especially early in a patient's presentation.  Change antibiotic therapy to   Cefazolin 2 grams IV q 8 horus [x]  []  Beta-lactam antibiotics are preferred for MSSA due to higher cure rates.   If on Vancomycin, goal trough should be 15 - 20 mcg/mL  Estimated duration of IV antibiotic therapy:  6 weeks followed by chronic oral abx x 6 months if not longer [x]  []  Consult case management for probably prolonged outpatient IV antibiotic therapy    #2 Cirrhosis: sounds based on  interview with to be NAFLD   #3 PCN allergy: made RN aware of trial of cephalosporin  Dipso: if cultures from 04/03/16 still no growth tomorrow would be OK to place PICC and DC to home  Dr Johnnye Sima available for questions over the weekend  Tentative plan for IV abx  Diagnosis:  MSSA bacteremia and septic PJI  Culture Result: MSSA  Allergies  Allergen Reactions  . Asa [Aspirin] Shortness Of Breath  . Penicillins Shortness Of Breath and Rash    Has patient had a PCN reaction causing immediate rash, facial/tongue/throat swelling, SOB or lightheadedness with hypotension: Yes Has patient had a PCN reaction causing severe rash involving mucus membranes or skin necrosis: No Has patient had a PCN reaction that required hospitalization No Has patient had a PCN reaction occurring within the last 10 years: No If  all of the above answers are "NO", then may proceed with Cephalosporin use.   . Vancomycin Anaphylaxis    Immediately following Vancomycin administration in the OR patient Vfib arrested.    Discharge antibiotics: cefazolin 2 grams IV q 8 hours Per pharmacy protocol cefazolin  Duration: 6 weeks Tentative End Date:  November 13th  [place] Per Protocol:  Labs weekly while on IV antibiotics: x__ CBC with differential  _x_ CMP _x_ CRP x__ ESR   __ Please pull PIC at completion of IV antibiotics _x_ Please leave PIC in place until doctor has seen patient or been notified  Fax weekly labs to [phone]  Clinic Follow Up Appt: Next 4 weeks    LOS: 4 days   Rhina Brackett [place] 04/06/2016, 8:21 PM

## 2016-04-06 NOTE — Progress Notes (Signed)
SPORTS MEDICINE AND JOINT REPLACEMENT  Lara Mulch, MD    Carlyon Shadow, PA-C [address], [place], [place]  [address]                             [phone]   PROGRESS NOTE  Subjective:  negative for Chest Pain  negative for Shortness of Breath  negative for Nausea/Vomiting   negative for Calf Pain  positive for Bowel Movement   Tolerating Diet: yes         Patient reports pain as 2 on 0-10 scale.    Objective: Vital signs in last 24 hours:   Patient Vitals for the past 24 hrs:  BP Temp Temp src Pulse Resp SpO2  04/06/16 0517 116/77 99.7 F (37.6 C) Oral (!) 102 20 96 %  04/05/16 2000 132/68 99.7 F (37.6 C) Oral 92 18 99 %  04/05/16 1300 118/68 99.1 F (37.3 C) Oral 82 18 100 %    @flow {1959:LAST@   Intake/Output from previous day:   10/05 0701 - 10/06 0700 In: 690 [P.O.:690] Out: -    Intake/Output this shift:   No intake/output data recorded.   Intake/Output      10/05 0701 - 10/06 0700 10/06 0701 - 10/07 0700   P.O. 690    IV Piggyback     Total Intake(mL/kg) 690 (7.1)    Net +690          Urine Occurrence 3 x       LABORATORY DATA:  Recent Labs  04/01/16 2354 04/02/16 0800  WBC 7.8 6.7  HGB 14.0 14.2  HCT 41.2 42.0  PLT 63* 51*    Recent Labs  04/01/16 2354 04/02/16 0800  NA 137 137  K 3.4* 3.6  CL 104 104  CO2 24 25  BUN 11 11  CREATININE 1.13 0.88  GLUCOSE 178* 148*  CALCIUM 8.3* 8.4*   Lab Results  Component Value Date   INR 1.22 04/01/2016   INR 1.21 02/22/2016   INR 1.00 05/13/2013    Examination:  General appearance: alert, cooperative and no distress Extremities: extremities normal, atraumatic, no cyanosis or edema  Wound Exam: clean, dry, intact   Drainage:  None: wound tissue dry  Motor Exam: Quadriceps and Hamstrings Intact  Sensory Exam: Superficial Peroneal, Deep Peroneal and Tibial normal   Assessment:    4 Days Post-Op  Procedure(s) (LRB): IRRIGATION AND DEBRIDEMENT RIGHT KNEE WITH POLY  EXCHANGE (Right)  ADDITIONAL DIAGNOSIS:  Active Problems:   Pyogenic arthritis of right knee joint (HCC)   Staphylococcus aureus bacteremia with sepsis (HCC)   Prosthetic joint infection, initial encounter (HCC)   Knee pain, acute   NAFLD (nonalcoholic fatty liver disease)   Unsteadiness on feet     Plan: Physical Therapy as ordered Weight Bearing as Tolerated (WBAT)  DVT Prophylaxis:  Lovenox  DISCHARGE PLAN: Home  DISCHARGE NEEDS: IV Antibiotics   Patient is still doing well and is hopeful for D/C by Saturday. Will continue to follow until infectious disease orders PICC line and clears patient for D/C. RXs signed and in chart.          Donia Ast 04/06/2016, 7:48 AM

## 2016-04-07 LAB — ANAEROBIC CULTURE

## 2016-04-07 MED ORDER — SODIUM CHLORIDE 0.9% FLUSH: 10.0000 mL | INTRAVENOUS | Status: DC | PRN

## 2016-04-07 MED ORDER — CEFAZOLIN SODIUM-DEXTROSE 2-4 GM/100ML-% IV SOLN
2.0000 g | Freq: Three times a day (TID) | INTRAVENOUS | Status: DC
  Filled 2016-04-07 (×2): qty 100

## 2016-04-07 MED ORDER — CEFAZOLIN SODIUM-DEXTROSE 2-4 GM/100ML-% IV SOLN
2.0000 g | Freq: Three times a day (TID) | INTRAVENOUS | Status: DC
  Administered 2016-04-07: 2 g via INTRAVENOUS
  Filled 2016-04-07 (×2): qty 100

## 2016-04-07 MED ORDER — CEFAZOLIN SODIUM-DEXTROSE 2-4 GM/100ML-% IV SOLN: 2.0000 g | Freq: Three times a day (TID) | INTRAVENOUS | 0 refills | Status: DC

## 2016-04-07 NOTE — Progress Notes (Signed)
   Assessment: 5 Days Post-Op  S/P Procedure(s) (LRB): IRRIGATION AND DEBRIDEMENT RIGHT KNEE WITH POLY EXCHANGE (Right) by Dr. Ronnie Derby on 04/03/16  Active Problems:   Pyogenic arthritis of right knee joint ([place])   Staphylococcus aureus bacteremia with sepsis [facility])   Prosthetic joint infection, initial encounter ([place])   Knee pain, acute   NAFLD (nonalcoholic fatty liver disease)   Unsteadiness on feet  ID okay'd PICC Placement for today.  Plan for D/C after placement.  Plan: Continue ABX therapy per ID:  Discharge antibiotics: cefazolin 2 grams IV q 8 hours Per pharmacy protocol cefazolin  Duration: 6 weeks Tentative End Date:  November 13th  [place] Per Protocol:  Labs weekly while on IV antibiotics: x__ CBC with differential  _x_ CMP _x_ CRP x__ ESR   __ Please pull PIC at completion of IV antibiotics _x_ Please leave PIC in place until doctor has seen patient or been notified  Fax weekly labs to [phone]  Clinic Follow Up Appt: Next 4 weeks  Weight Bearing: Weight Bearing as Tolerated (WBAT)  Dressings: Aquacel.  VTE prophylaxis: Lovenox, SCDs, ambulation Dispo: Home  Subjective: Patient reports pain as mild. Pain controlled with PO meds.  Tolerating diet.  Urinating.  +Flatus.  No CP, SOB.  OOB.  Objective:   VITALS:   Vitals:   04/06/16 0517 04/06/16 1332 04/06/16 2032 04/07/16 0515  BP: 116/77 97/66 132/67 111/79  Pulse: (!) 102 94 91 91  Resp: 20 20 18 18   Temp: 99.7 F (37.6 C) 98.6 F (37 C) 99 F (37.2 C) 98.3 F (36.8 C)  TempSrc: Oral Oral Oral Oral  SpO2: 96% 96% 97% 95%  Weight:      Height:       CBC Latest Ref Rng & Units 04/02/2016 04/01/2016 03/04/2016  WBC 4.0 - 10.5 K/uL 6.7 7.8 4.9  Hemoglobin 13.0 - 17.0 g/dL 14.2 14.0 15.5  Hematocrit 39.0 - 52.0 % 42.0 41.2 43.7  Platelets 150 - 400 K/uL 51(L) 63(L) 105(L)   BMP Latest Ref Rng & Units 04/02/2016 04/01/2016 03/04/2016  Glucose 65 - 99 mg/dL 148(H) 178(H) 159(H)    BUN 6 - 20 mg/dL 11 11 11   Creatinine 0.61 - 1.24 mg/dL 0.88 1.13 0.95  Sodium 135 - 145 mmol/L 137 137 134(L)  Potassium 3.5 - 5.1 mmol/L 3.6 3.4(L) 3.3(L)  Chloride 101 - 111 mmol/L 104 104 99(L)  CO2 22 - 32 mmol/L 25 24 27   Calcium 8.9 - 10.3 mg/dL 8.4(L) 8.3(L) 8.6(L)   Intake/Output      10/06 0701 - 10/07 0700 10/07 0701 - 10/08 0700   P.O. 240    IV Piggyback 50    Total Intake(mL/kg) 290 (3)    Urine (mL/kg/hr) 450 (0.2)    Total Output 450     Net -160          Urine Occurrence 3 x     General: NAD.  Supine in bed Resp: No increased wob Cardio: regular rate and rhythm ABD soft Neurologically intact MSK Neurovascularly intact Sensation intact distally Foot warm Dorsiflexion/Plantar flexion intact Incision: dressing C/D/I with mild drainage on Aquacel   Stephen Burch 04/07/2016, 9:04 AM

## 2016-04-07 NOTE — Discharge Summary (Signed)
Discharge Summary  Patient ID: Stephen Burch MRN: [id number] DOB/AGE: 1955-12-12 60 y.o.  Admit date: 04/01/2016 Discharge date: 04/07/2016  Admission Diagnoses:  Pyogenic arthritis of right knee joint [facility])  Discharge Diagnoses:  Principal Problem:   Pyogenic arthritis of right knee joint (HCC) Active Problems:   Staphylococcus aureus bacteremia with sepsis [facility])   Prosthetic joint infection, initial encounter (HCC)   Knee pain, acute   NAFLD (nonalcoholic fatty liver disease)   Unsteadiness on feet   Past Medical History:  Diagnosis Date  . Arthritis    knees  . Asthma    as a kid  . Cirrhosis (HCC)   . GERD (gastroesophageal reflux disease)   . History of hiatal hernia   . Hx of cardiac arrest   . Kidney stones   . Liver cirrhosis (HCC)   . NAFLD (nonalcoholic fatty liver disease) 78/08/9560  . Pneumonia    3 years ago  . Thrombocytopenia (HCC)     Surgeries: Procedure(s): IRRIGATION AND DEBRIDEMENT RIGHT KNEE WITH POLY EXCHANGE on 04/01/2016 - 04/02/2016   Consultants (if any):   Discharged Condition: Improved  Hospital Course: Stephen Burch is an 60 y.o. male who was admitted 04/01/2016 with a diagnosis of Pyogenic arthritis of right knee joint (HCC) and went to the operating room on 04/01/2016 - 04/02/2016 and underwent the above named procedures.    He was given perioperative antibiotics:  Anti-infectives    Start     Dose/Rate Route Frequency Ordered Stop   04/07/16 1400  ceFAZolin (ANCEF) IVPB 2g/100 mL premix  Status:  Discontinued     2 g 200 mL/hr over 30 Minutes Intravenous Every 8 hours 04/07/16 0916 04/07/16 1109   04/07/16 1200  ceFAZolin (ANCEF) IVPB 2g/100 mL premix     2 g 200 mL/hr over 30 Minutes Intravenous Every 8 hours 04/07/16 1109     04/07/16 0000  ceFAZolin (ANCEF) 2-4 GM/100ML-% IVPB     2 g 200 mL/hr over 30 Minutes Intravenous Every 8 hours 04/07/16 0930 05/19/16 2359   04/06/16 1500  ceFAZolin (ANCEF) IVPB 1 g/50 mL premix  Status:   Discontinued     1 g 100 mL/hr over 30 Minutes Intravenous Every 8 hours 04/06/16 1449 04/07/16 0916   04/03/16 1300  DAPTOmycin (CUBICIN) 750 mg in sodium chloride 0.9 % IVPB  Status:  Discontinued     750 mg 230 mL/hr over 30 Minutes Intravenous Every 24 hours 04/03/16 1219 04/06/16 1335   04/02/16 1715  clindamycin (CLEOCIN) IVPB 900 mg  Status:  Discontinued     900 mg 100 mL/hr over 30 Minutes Intravenous Every 8 hours 04/02/16 1705 04/03/16 1219   04/02/16 0900  clindamycin (CLEOCIN) IVPB 900 mg  Status:  Discontinued     900 mg 100 mL/hr over 30 Minutes Intravenous On call to O.R. 04/02/16 0845 04/02/16 1703   04/02/16 0230  doxycycline (VIBRA-TABS) tablet 100 mg     100 mg Oral  Once 04/02/16 0229 04/02/16 0303    .  He was given sequential compression devices, early ambulation, and lovenox for DVT prophylaxis.  He benefited maximally from the hospital stay and there were no complications.    Recent vital signs:  Vitals:   04/06/16 2032 04/07/16 0515  BP: 132/67 111/79  Pulse: 91 91  Resp: 18 18  Temp: 99 F (37.2 C) 98.3 F (36.8 C)    Recent laboratory studies:  Lab Results  Component Value Date   HGB 14.2 04/02/2016  HGB 14.0 04/01/2016   HGB 15.5 03/04/2016   Lab Results  Component Value Date   WBC 6.7 04/02/2016   PLT 51 (L) 04/02/2016   Lab Results  Component Value Date   INR 1.22 04/01/2016   Lab Results  Component Value Date   NA 137 04/02/2016   K 3.6 04/02/2016   CL 104 04/02/2016   CO2 25 04/02/2016   BUN 11 04/02/2016   CREATININE 0.88 04/02/2016   GLUCOSE 148 (H) 04/02/2016    Discharge Medications:     Medication List    STOP taking these medications   doxycycline 100 MG capsule Commonly known as:  VIBRAMYCIN   HYDROcodone-acetaminophen 5-325 MG tablet Commonly known as:  NORCO/VICODIN     TAKE these medications   ceFAZolin 2-4 GM/100ML-% IVPB Commonly known as:  ANCEF Inject 100 mLs (2 g total) into the vein every 8  (eight) hours.   enoxaparin 40 MG/0.4ML injection Commonly known as:  LOVENOX Inject 0.4 mLs (40 mg total) into the skin daily.   methocarbamol 500 MG tablet Commonly known as:  ROBAXIN Take 1-2 tablets (500-1,000 mg total) by mouth every 6 (six) hours as needed for muscle spasms.   ondansetron 4 MG tablet Commonly known as:  ZOFRAN Take 1 tablet (4 mg total) by mouth every 6 (six) hours as needed for nausea.   oxyCODONE 5 MG immediate release tablet Commonly known as:  Oxy IR/ROXICODONE Take 1-2 tablets (5-10 mg total) by mouth every 3 (three) hours as needed for breakthrough pain.   pantoprazole 40 MG tablet Commonly known as:  PROTONIX Take 1 tablet (40 mg total) by mouth daily. What changed:  Another medication with the same name was removed. Continue taking this medication, and follow the directions you see here.       Diagnostic Studies: Dg Chest Portable 1 View  Result Date: 04/02/2016 CLINICAL DATA:  Fever. EXAM: PORTABLE CHEST 1 VIEW COMPARISON:  None. FINDINGS: Shallow inspiration. Normal heart size and pulmonary vascularity. No focal airspace disease or consolidation in the lungs. No blunting of costophrenic angles. No pneumothorax. Mediastinal contours appear intact. IMPRESSION: Shallow inspiration.  No evidence of active pulmonary disease. Electronically Signed   By: Burman Nieves M.D.   On: 04/02/2016 00:45   Dg Knee Complete 4 Views Right  Result Date: 04/02/2016 CLINICAL DATA:  Pain and swelling of the posterior right knee. Recent removal of Baker's cyst on 02/22/2016. EXAM: RIGHT KNEE - COMPLETE 4+ VIEW COMPARISON:  None. FINDINGS: Postoperative right total knee arthroplasty with patellar femoral component. Components appear well seated. Minimal lucency at the bone hardware interface for the tibial component could indicate mild loosening. Multiple old appearing ossicles adjacent to the knee are likely postoperative. Moderate size right knee effusion. Skin  thickening over the suprapatellar region. No acute fracture or dislocation. IMPRESSION: Postoperative right knee arthroplasty. Can't exclude mild loosening of the tibial component. Moderate size right knee effusion with overlying skin thickening. No acute fracture or dislocation. Electronically Signed   By: Burman Nieves M.D.   On: 04/02/2016 00:44    Disposition: 01-Home or Self Care    Follow-up Information    [facility] .   Why:  Someone from [facility] will contact you to arrange start date and time for Home Health RN to do PICC Care and instruct on IV antibiotic infusion.  You will be going to outpatient therapy for your Knee per Dr's office. Contact information: [address] [place]  [address] [phone]            Signed: Albina Billet III PA-C 04/07/2016, 4:45 PM

## 2016-04-07 NOTE — Progress Notes (Signed)
Pharmacy Antibiotic Note  Stephen Burch is a 60 y.o. male admitted on 04/01/2016 with bacteremia.  Pharmacy has been consulted for cefazolin dosing.  Plan: - Change cefazolin dose to 2 gm IV q8h w/ septic joint and bacteremia with normal kidney function - Monitor clinical progress, c/s, renal function, abx plan/LOT   Height: 6' (182.9 cm) Weight: 214 lb (97.1 kg) IBW/kg (Calculated) : 77.6  Temp (24hrs), Avg:98.6 F (37 C), Min:98.3 F (36.8 C), Max:99 F (37.2 C)   Recent Labs Lab 04/01/16 2354 04/02/16 0018 04/02/16 0800  WBC 7.8  --  6.7  CREATININE 1.13  --  0.88  LATICACIDVEN  --  1.82  --     Estimated Creatinine Clearance: 105.1 mL/min (by C-G formula based on SCr of 0.88 mg/dL).    Allergies  Allergen Reactions  . Asa [Aspirin] Shortness Of Breath  . Penicillins Shortness Of Breath and Rash    Has patient had a PCN reaction causing immediate rash, facial/tongue/throat swelling, SOB or lightheadedness with hypotension: Yes Has patient had a PCN reaction causing severe rash involving mucus membranes or skin necrosis: No Has patient had a PCN reaction that required hospitalization No Has patient had a PCN reaction occurring within the last 10 years: No If all of the above answers are "NO", then may proceed with Cephalosporin use.   . Vancomycin Anaphylaxis    Immediately following Vancomycin administration in the OR patient Vfib arrested.    Antimicrobials this admission: 10/1 clinda >> 10/3 10/3 dapto >> 10/5 10/6 cefazolin>> (11/13)   Microbiology results: 10/3 BCx: ngtd 10/1 BCx: MSSA (bcid) 10/2: knee fluid - staph aureus 10/1 MRSA PCR: Neg (positive for MSSA)  Cassie L. Nicole Kindred, PharmD Infectious Diseases Clinical Pharmacist Pager: [phone] 04/07/2016 9:15 AM

## 2016-04-07 NOTE — Progress Notes (Signed)
Peripherally Inserted Central Catheter/Midline Placement  The IV Nurse has discussed with the patient and/or persons authorized to consent for the patient, the purpose of this procedure and the potential benefits and risks involved with this procedure.  The benefits include less needle sticks, lab draws from the catheter, and the patient may be discharged home with the catheter. Risks include, but not limited to, infection, bleeding, blood clot (thrombus formation), and puncture of an artery; nerve damage and irregular heartbeat and possibility to perform a PICC exchange if needed/ordered by physician.  Alternatives to this procedure were also discussed.  Bard Power PICC patient education guide, fact sheet on infection prevention and patient information card has been provided to patient /or left at bedside.    PICC/Midline Placement Documentation  PICC Single Lumen 04/07/16 PICC Right Brachial 42 cm 0 cm (Active)  Indication for Insertion or Continuance of Line Home intravenous therapies (PICC only) 04/07/2016 10:47 AM  Exposed Catheter (cm) 0 cm 04/07/2016 10:47 AM  Site Assessment Clean;Dry;Intact 04/07/2016 10:47 AM  Line Status Flushed;Saline locked;Blood return noted 04/07/2016 10:47 AM  Dressing Type Transparent 04/07/2016 10:47 AM  Dressing Status Clean;Dry;Intact;Antimicrobial disc in place 04/07/2016 10:47 AM  Line Care Connections checked and tightened 04/07/2016 10:47 AM  Line Adjustment (NICU/IV Team Only) No 04/07/2016 10:47 AM  Dressing Intervention New dressing 04/07/2016 10:47 AM  Dressing Change Due 04/14/16 04/07/2016 10:47 AM       Rolena Infante 04/07/2016, 10:48 AM

## 2016-04-07 NOTE — Care Management Note (Signed)
Case Management Note  Patient Details  Name: Stephen Burch MRN: [id number] Date of Birth: 07-Oct-1955  Subjective/Objective:                  septic R knee Action/Plan: Discharge planning Expected Discharge Date:                  Expected Discharge Plan:  [place]  In-House Referral:     Discharge planning Services  CM Consult  Post Acute Care Choice:  Home Health, Durable Medical Equipment Choice offered to:  Patient  DME Arranged:  IV pump/equipment DME Agency:  [place] Arranged:  RN [facility] Agency:  [place]  Status of Service:  Completed, signed off  If discussed at [place] of Stay Meetings, dates discussed:    Additional Comments: CM spoke with RN who will run last dose of Ancef at 1-2pm; CM has notified [facility] rep, Jermaine to relay time of Ancef run so he can plan for [facility] this evening for next scheduled dose at home. No other CM needs were communicated. Dellie Catholic, RN 04/07/2016, 11:07 AM

## 2016-04-07 NOTE — Progress Notes (Signed)
PT Cancellation Note  Patient Details Name: Stephen Burch MRN: [id number] DOB: 02-Jul-1956   Cancelled Treatment:    Reason Eval/Treat Not Completed: Patient at procedure or test/unavailable PT will check on pt later as time allows.    Sal[place] April, PTA Pager: [phone]   04/07/2016, 10:45 AM

## 2016-04-07 NOTE — Progress Notes (Signed)
Physical Therapy Treatment Patient Details Name: Stephen Burch MRN: [id number] DOB: [birth year] Today's Date: 04/07/2016    History of Present Illness 60 y.o. male with PMHx of Liver Cirrhosis. He underwent I&D of Baker's cyst R knee 9/23 and presented to the ED 10/1 with c/o severe R knee pain. Pt admitted for septic R knee joint and underwent I&D 10/2.    PT Comments    Patient is making good progress with PT.  From a mobility standpoint anticipate patient will be ready for DC home with family support when medically ready.     Follow Up Recommendations  Supervision - Intermittent;Outpatient PT     Equipment Recommendations  None recommended by PT    Recommendations for Other Services       Precautions / Restrictions Precautions Precautions: None Restrictions Weight Bearing Restrictions: Yes RLE Weight Bearing: Weight bearing as tolerated    Mobility  Bed Mobility Overal bed mobility: Modified Independent                Transfers Overall transfer level: Modified independent Equipment used: Rolling walker (2 wheeled) Transfers: Sit to/from [employer] Transfers              Ambulation/Gait Ambulation/Gait assistance: Supervision Ambulation Distance (Feet): 450 Feet Assistive device: Rolling walker (2 wheeled) Gait Pattern/deviations: Step-through pattern;Decreased stance time - right;Trunk flexed;Decreased weight shift to right Gait velocity: decreased   General Gait Details: cues for posture and symmetrical step length; slow, steady gait; pt attempting to increase R LE WB   Stairs         General stair comments: pt verbalized technique  Wheelchair Mobility    Modified Rankin (Stroke Patients Only)       Balance                                    Cognition Arousal/Alertness: Awake/alert Behavior During Therapy: WFL for tasks assessed/performed Overall Cognitive Status: Within Functional Limits for tasks assessed                       Exercises Total Joint Exercises Heel Slides: AROM;Right;10 reps;Supine Hip ABduction/ADduction: AROM;Right;10 reps;Supine    General Comments        Pertinent Vitals/Pain Pain Assessment: Faces Faces Pain Scale: Hurts whole lot Pain Location: R knee Pain Descriptors / Indicators: Aching;Grimacing;Sore Pain Intervention(s): Limited activity within patient's tolerance;Monitored during session;Premedicated before session;Repositioned    Home Living                      Prior Function            PT Goals (current goals can now be found in the care plan section) Acute Rehab PT Goals Patient Stated Goal: home Progress towards PT goals: Progressing toward goals    Frequency    Min 6X/week      PT Plan Current plan remains appropriate    Co-evaluation             End of Session Equipment Utilized During Treatment: Gait belt Activity Tolerance: Patient tolerated treatment well Patient left: in chair;with call bell/phone within reach;with family/visitor present     Time: 1914-7829 PT Time Calculation (min) (ACUTE ONLY): 28 min  Charges:  $Gait Training: 8-22 mins $Therapeutic Exercise: 8-22 mins  G Codes:      [name], PTA Pager: [phone]   04/07/2016, 12:23 PM

## 2016-04-07 NOTE — Progress Notes (Signed)
Pt was provided with discharge education. Pt will go home with family and home health.   Family is at the bedside. No concerns voiced at this time. Pt is ready for discharge.   Paulla Fore, RN

## 2016-04-08 LAB — CULTURE, BLOOD (ROUTINE X 2)
CULTURE: NO GROWTH
Culture: NO GROWTH

## 2016-04-10 ENCOUNTER — Other Ambulatory Visit (HOSPITAL_COMMUNITY)
Admission: RE | Admit: 2016-04-10 | Discharge: 2016-04-10 | Disposition: A | Discharge status: Home / Self Care | Payer: Medicare Other | Source: Other Acute Inpatient Hospital | Attending: Orthopedic Surgery | Admitting: Orthopedic Surgery

## 2016-04-10 DIAGNOSIS — Z792 Long term (current) use of antibiotics: Secondary | ICD-10-CM | POA: Diagnosis present

## 2016-04-10 DIAGNOSIS — Z5181 Encounter for therapeutic drug level monitoring: Secondary | ICD-10-CM | POA: Insufficient documentation

## 2016-04-10 LAB — CBC WITH DIFFERENTIAL/PLATELET
BASOS ABS: 0 10*3/uL (ref 0.0–0.1)
BASOS PCT: 0 %
Eosinophils Absolute: 0.2 10*3/uL (ref 0.0–0.7)
Eosinophils Relative: 3 %
HEMATOCRIT: 34.1 % — AB (ref 39.0–52.0)
HEMOGLOBIN: 11.4 g/dL — AB (ref 13.0–17.0)
LYMPHS PCT: 18 %
Lymphs Abs: 0.8 10*3/uL (ref 0.7–4.0)
MCH: 29.8 pg (ref 26.0–34.0)
MCHC: 33.4 g/dL (ref 30.0–36.0)
MCV: 89 fL (ref 78.0–100.0)
MONO ABS: 0.5 10*3/uL (ref 0.1–1.0)
Monocytes Relative: 12 %
NEUTROS ABS: 3 10*3/uL (ref 1.7–7.7)
NEUTROS PCT: 67 %
Platelets: 167 10*3/uL (ref 150–400)
RBC: 3.83 MIL/uL — AB (ref 4.22–5.81)
RDW: 13 % (ref 11.5–15.5)
WBC: 4.5 10*3/uL (ref 4.0–10.5)

## 2016-04-10 LAB — SEDIMENTATION RATE: SED RATE: 80 mm/h — AB (ref 0–16)

## 2016-04-10 LAB — C-REACTIVE PROTEIN: CRP: 7.7 mg/dL — ABNORMAL HIGH (ref <1.0)

## 2016-04-11 LAB — COMPREHENSIVE METABOLIC PANEL
ALBUMIN: 3 g/dL — AB (ref 3.5–5.0)
ALT: 29 U/L (ref 17–63)
AST: 32 U/L (ref 15–41)
Alkaline Phosphatase: 231 U/L — ABNORMAL HIGH (ref 38–126)
Anion gap: 8 (ref 5–15)
BUN: 15 mg/dL (ref 6–20)
CHLORIDE: 102 mmol/L (ref 101–111)
CO2: 30 mmol/L (ref 22–32)
CREATININE: 0.77 mg/dL (ref 0.61–1.24)
Calcium: 8.5 mg/dL — ABNORMAL LOW (ref 8.9–10.3)
GFR calc non Af Amer: >60 mL/min (ref >60)
Glucose, Bld: 114 mg/dL — ABNORMAL HIGH (ref 65–99)
Potassium: 3.2 mmol/L — ABNORMAL LOW (ref 3.5–5.1)
SODIUM: 140 mmol/L (ref 135–145)
Total Bilirubin: 0.7 mg/dL (ref 0.3–1.2)
Total Protein: 6.7 g/dL (ref 6.5–8.1)

## 2016-04-23 ENCOUNTER — Telehealth (INDEPENDENT_AMBULATORY_CARE_PROVIDER_SITE_OTHER): Payer: Self-pay | Admitting: Internal Medicine

## 2016-04-23 NOTE — Telephone Encounter (Signed)
Patient presented to the office wanting to update Dr. Laural Golden on his current medications and to let him know he had knee surgery on 04/02/16.  His current medications are:  Oxycodone 53m tab 2-3 times a day Cefazolin Sodium 2gm in 20 mi SWFI every 8 hours Heparin Flush solution - 552min .9% Sodium Chloride 1 eery 8 hours Normal saline IV flush syringe 0.9% sodium chloride injection 1063m every 8 hours Pantoprazole sodium 20m23mhe patient had these written down, I typed them as written

## 2016-04-24 NOTE — Telephone Encounter (Signed)
We will try and get this in patient's medication list.

## 2016-05-01 ENCOUNTER — Encounter (HOSPITAL_COMMUNITY): Payer: Self-pay | Admitting: Physical Therapy

## 2016-05-01 ENCOUNTER — Ambulatory Visit (HOSPITAL_COMMUNITY): Payer: Medicare Other | Attending: Family Medicine | Admitting: Physical Therapy

## 2016-05-01 NOTE — Therapy (Signed)
[facility] [place], [place], [address] Phone: [phone]   Fax:  [phone]  Patient Details  Name: Stephen Burch MRN: [id number] Date of Birth: December 03, 1955 Referring Provider:  No ref. provider found  Encounter Date: 05/01/2016  Patient arrives today confirming that he is currently receiving home health nursing to change bandages on his PICC line as well as for blood work once a week right now.   DPT spoke to rehabilitation supervisor regarding this, and supervisor confirms and advises that patient is current ineligible for skilled OP PT services due to receiving home health nursing.   Explained this to patient, and offered alternative options such as performing wound care/bandage change in this PT clinic, or speaking to MD to try to get HHPT until home nursing ends and patient can transition back to outpatient. However patient refuses all options at this time, stating he wants to keep working with home health nursing; he refused to reschedule OP PT evaluation for after home health nursing is scheduled to end, stating he is "just going to go to the local senior center and take care of it myself".   Deniece Ree PT, DPT [phone]  [facility] [address] [place], [place], [address] Phone: [phone]   Fax:  [phone]

## 2016-05-07 ENCOUNTER — Telehealth: Payer: Self-pay | Admitting: *Deleted

## 2016-05-07 ENCOUNTER — Ambulatory Visit (INDEPENDENT_AMBULATORY_CARE_PROVIDER_SITE_OTHER): Payer: Medicare Other | Admitting: Internal Medicine

## 2016-05-07 DIAGNOSIS — T8450XA Infection and inflammatory reaction due to unspecified internal joint prosthesis, initial encounter: Secondary | ICD-10-CM | POA: Diagnosis not present

## 2016-05-07 MED ORDER — RIFAMPIN 300 MG PO CAPS: 600.0000 mg | ORAL_CAPSULE | Freq: Every day | ORAL | 4 refills | Status: DC

## 2016-05-07 MED ORDER — CEPHALEXIN 500 MG PO CAPS: 500.0000 mg | ORAL_CAPSULE | Freq: Three times a day (TID) | ORAL | 4 refills | Status: DC

## 2016-05-07 MED ORDER — CEFAZOLIN SODIUM-DEXTROSE 2-4 GM/100ML-% IV SOLN: 2.0000 g | Freq: Three times a day (TID) | INTRAVENOUS | 0 refills | Status: AC

## 2016-05-07 NOTE — Progress Notes (Signed)
[place] for Infectious Disease  Patient Active Problem List   Diagnosis Date Noted  . Staphylococcus aureus bacteremia with sepsis [facility])     Priority: High  . Prosthetic joint infection, initial encounter [facility])     Priority: High  . Pyogenic arthritis of right knee joint ([place]) 04/02/2016    Priority: High  . NAFLD (nonalcoholic fatty liver disease) 04/04/2016  . Unsteadiness on feet   . Knee pain, acute   . Thrombocytopenia ([place])   . Hypomagnesemia   . Cardiac arrest ([place]) 02/22/2016  . Encounter for central line placement   . Shock circulatory ([place])   . Vitiligo 08/22/2015  . AD (atopic dermatitis) 08/22/2015  . Osteoarthritis of right knee 08/22/2015  . Cirrhosis of liver without ascites ([place]) 08/22/2015  . Hyperglycemia 08/22/2015  . GERD (gastroesophageal reflux disease) 08/22/2015  . Ganglion of left wrist 08/22/2015  . ED (erectile dysfunction) of organic origin 08/22/2015  . Asthma 08/22/2015  . Dysplastic nodule of liver 08/22/2015  . Baker cyst 09/06/2014  . H/O total knee replacement 08/05/2014  . Bilateral inguinal hernia 08/01/2012  . Splenomegaly 08/01/2012  . Portal hypertension ([place]) 08/01/2012  . Gallstones 08/01/2012    Patient's Medications  New Prescriptions   CEPHALEXIN (KEFLEX) 500 MG CAPSULE    Take 1 capsule (500 mg total) by mouth 3 (three) times daily.   RIFAMPIN (RIFADIN) 300 MG CAPSULE    Take 2 capsules (600 mg total) by mouth daily.  Previous Medications   PANTOPRAZOLE (PROTONIX) 40 MG TABLET    Take 1 tablet (40 mg total) by mouth daily.  Modified Medications   Modified Medication Previous Medication   CEFAZOLIN (ANCEF) 2-4 GM/100ML-% IVPB ceFAZolin (ANCEF) 2-4 GM/100ML-% IVPB      Inject 100 mLs (2 g total) into the vein every 8 (eight) hours.    Inject 100 mLs (2 g total) into the vein every 8 (eight) hours.  Discontinued Medications   ENOXAPARIN (LOVENOX) 40 MG/0.4ML INJECTION    Inject 0.4 mLs (40 mg total) into  the skin daily.   METHOCARBAMOL (ROBAXIN) 500 MG TABLET    Take 1-2 tablets (500-1,000 mg total) by mouth every 6 (six) hours as needed for muscle spasms.   ONDANSETRON (ZOFRAN) 4 MG TABLET    Take 1 tablet (4 mg total) by mouth every 6 (six) hours as needed for nausea.   OXYCODONE (OXY IR/ROXICODONE) 5 MG IMMEDIATE RELEASE TABLET    Take 1-2 tablets (5-10 mg total) by mouth every 3 (three) hours as needed for breakthrough pain.    Subjective: Mr. Almas is in for his hospital follow-up visit. He's had bilateral knee replacements and recently underwent multiple aspirations and then surgery for a right knee Baker's cyst. He then developed severe knee pain and was admitted to the hospital and found to have MSSA bacteremia. He underwent incision and drainage of his right knee with poly-exchange on 04/04/2016. No direct communication with his posterior knee wound was found. Repeat blood cultures were negative. There was no evidence of endocarditis clinically or by TTE. He had a history of penicillin allergy but was switched to cefazolin and tolerated it well. He is now completed 5 weeks of total antibiotic therapy. He has had no problems tolerating his PICC or cefazolin. He is feeling better. His pain is decreasing and he has not required any pain medication in several weeks. He still has right knee pain up to 5 out of 10. It is  worse when he is trying to sleep in his bed. He is still sleeping in a recliner most nights. He was told that he could just not start physical therapy as long as he had a home nurse.  Review of Systems: Review of Systems  Constitutional: Positive for diaphoresis. Negative for chills, fever, malaise/fatigue and weight loss.  HENT: Negative for sore throat.   Respiratory: Negative for cough, sputum production and shortness of breath.   Cardiovascular: Negative for chest pain.  Gastrointestinal: Negative for abdominal pain, diarrhea, nausea and vomiting.  Genitourinary: Negative for  dysuria and frequency.  Musculoskeletal: Positive for joint pain. Negative for myalgias.  Skin: Negative for rash.    Past Medical History:  Diagnosis Date  . Arthritis    knees  . Asthma    as a kid  . Cirrhosis ([place])   . GERD (gastroesophageal reflux disease)   . History of hiatal hernia   . Hx of cardiac arrest   . Kidney stones   . Liver cirrhosis ([place])   . NAFLD (nonalcoholic fatty liver disease) 04/04/2016  . Pneumonia    3 years ago  . Thrombocytopenia ([place])     Social History  Substance Use Topics  . Smoking status: Never Smoker  . Smokeless tobacco: Never Used  . Alcohol use No    Family History  Problem Relation Age of Onset  . Diabetes Mother     type 2, born 1  . Arthritis Father     died age 79  . GI Bleed Father     upper GI Bleed, non-ETOH cirrhosis  . Arthritis Brother   . Diabetes Brother     type 2  . Diabetes Paternal Uncle     type 2  . Heart attack Maternal Grandmother   . Heart attack Maternal Grandfather   . Colon cancer Neg Hx   . Colon polyps Neg Hx     Allergies  Allergen Reactions  . Asa [Aspirin] Shortness Of Breath  . Penicillins Shortness Of Breath and Rash    Has patient had a PCN reaction causing immediate rash, facial/tongue/throat swelling, SOB or lightheadedness with hypotension: Yes Has patient had a PCN reaction causing severe rash involving mucus membranes or skin necrosis: No Has patient had a PCN reaction that required hospitalization No Has patient had a PCN reaction occurring within the last 10 years: No If all of the above answers are "NO", then may proceed with Cephalosporin use.   . Vancomycin Anaphylaxis    Immediately following Vancomycin administration in the OR patient Vfib arrested.    Objective: Vitals:   05/07/16 1442  BP: 139/90  Pulse: 94  Temp: 98.5 F (36.9 C)  TempSrc: Oral  Weight: 211 lb (95.7 kg)  Height: 6' (1.829 m)   Body mass index is 28.62 kg/m.  Physical Exam    Constitutional: He is oriented to person, place, and time. No distress.  Cardiovascular: Normal rate and regular rhythm.   No murmur heard. Pulmonary/Chest: Effort normal and breath sounds normal. He has no wheezes. He has no rales.  Musculoskeletal:  His right knee incisions is fully healed. He has diffuse swelling and slight warmth. His flexion is restricted to about 90.  Neurological: He is alert and oriented to person, place, and time.  Skin: No rash noted.  Psychiatric: Mood and affect normal.    Lab Results Sedimentation rate 04/17/2016: 42 CRP 04/17/2016 20.8 Sed Rate (mm/hr)  Date Value  04/10/2016 80 (H)  04/04/2016 60 (H)  04/02/2016 12   CRP (mg/dL)  Date Value  04/10/2016 7.7 (H)  04/04/2016 14.8 (H)  04/02/2016 18.6 (H)     Problem List Items Addressed This Visit      High   Prosthetic joint infection, initial encounter [facility])    He is making improvement on therapy for MSSA bacteremia and right prosthetic knee infection. I will have him complete 1 more week of IV cefazolin and then switch him to oral cephalexin. I will add rifampin for synergy. He does not have any other drugs that would cause a significant drug drug interaction. He does have cirrhosis but I believe the potential benefit of rifampin outweighs any potential risks. He will follow-up here in 6 weeks.      Relevant Medications   ceFAZolin (ANCEF) 2-4 GM/100ML-% IVPB   cephALEXin (KEFLEX) 500 MG capsule   rifampin (RIFADIN) 300 MG capsule       Michel Bickers, MD [facility] for Infectious [place] [facility] [phone] pager   [phone] cell 05/07/2016, 3:22 PM

## 2016-05-07 NOTE — Assessment & Plan Note (Signed)
He is making improvement on therapy for MSSA bacteremia and right prosthetic knee infection. I will have him complete 1 more week of IV cefazolin and then switch him to oral cephalexin. I will add rifampin for synergy. He does not have any other drugs that would cause a significant drug drug interaction. He does have cirrhosis but I believe the potential benefit of rifampin outweighs any potential risks. He will follow-up here in 6 weeks.

## 2016-05-07 NOTE — Telephone Encounter (Signed)
Verbal order given to Coretta at [facility] to pull patient's picc line after last antibiotic dose on 05/14/16 per Dr. Megan Salon. Stephen Burch

## 2016-05-15 ENCOUNTER — Other Ambulatory Visit (HOSPITAL_COMMUNITY)
Admission: RE | Admit: 2016-05-15 | Discharge: 2016-05-15 | Disposition: A | Discharge status: Home / Self Care | Payer: Medicare Other | Source: Other Acute Inpatient Hospital | Attending: Orthopedic Surgery | Admitting: Orthopedic Surgery

## 2016-05-15 DIAGNOSIS — T814XXA Infection following a procedure, initial encounter: Secondary | ICD-10-CM | POA: Insufficient documentation

## 2016-05-15 DIAGNOSIS — Y839 Surgical procedure, unspecified as the cause of abnormal reaction of the patient, or of later complication, without mention of misadventure at the time of the procedure: Secondary | ICD-10-CM | POA: Diagnosis not present

## 2016-05-15 LAB — COMPREHENSIVE METABOLIC PANEL
ALT: 12 U/L — ABNORMAL LOW (ref 17–63)
ANION GAP: 7 (ref 5–15)
AST: 28 U/L (ref 15–41)
Albumin: 3.9 g/dL (ref 3.5–5.0)
Alkaline Phosphatase: 173 U/L — ABNORMAL HIGH (ref 38–126)
BUN: 11 mg/dL (ref 6–20)
CHLORIDE: 103 mmol/L (ref 101–111)
CO2: 26 mmol/L (ref 22–32)
Calcium: 8.6 mg/dL — ABNORMAL LOW (ref 8.9–10.3)
Creatinine, Ser: 0.81 mg/dL (ref 0.61–1.24)
Glucose, Bld: 112 mg/dL — ABNORMAL HIGH (ref 65–99)
POTASSIUM: 3.7 mmol/L (ref 3.5–5.1)
Sodium: 136 mmol/L (ref 135–145)
Total Bilirubin: 0.7 mg/dL (ref 0.3–1.2)
Total Protein: 7 g/dL (ref 6.5–8.1)

## 2016-05-15 LAB — CBC
HEMATOCRIT: 39.5 % (ref 39.0–52.0)
HEMOGLOBIN: 12.8 g/dL — AB (ref 13.0–17.0)
MCH: 28.1 pg (ref 26.0–34.0)
MCHC: 32.4 g/dL (ref 30.0–36.0)
MCV: 86.8 fL (ref 78.0–100.0)
Platelets: 113 10*3/uL — ABNORMAL LOW (ref 150–400)
RBC: 4.55 MIL/uL (ref 4.22–5.81)
RDW: 13.8 % (ref 11.5–15.5)
WBC: 3.2 10*3/uL — ABNORMAL LOW (ref 4.0–10.5)

## 2016-05-15 LAB — SEDIMENTATION RATE: SED RATE: 17 mm/h — AB (ref 0–16)

## 2016-05-16 ENCOUNTER — Telehealth (HOSPITAL_COMMUNITY): Payer: Self-pay | Admitting: Family Medicine

## 2016-05-16 ENCOUNTER — Ambulatory Visit (HOSPITAL_COMMUNITY): Payer: Medicare Other | Attending: Family Medicine

## 2016-05-16 DIAGNOSIS — M25661 Stiffness of right knee, not elsewhere classified: Secondary | ICD-10-CM | POA: Diagnosis present

## 2016-05-16 DIAGNOSIS — M6281 Muscle weakness (generalized): Secondary | ICD-10-CM | POA: Insufficient documentation

## 2016-05-16 DIAGNOSIS — R600 Localized edema: Secondary | ICD-10-CM | POA: Diagnosis present

## 2016-05-16 LAB — C-REACTIVE PROTEIN: CRP: 1.5 mg/dL — ABNORMAL HIGH (ref <1.0)

## 2016-05-16 NOTE — Telephone Encounter (Signed)
05/16/16 I left him a message because on 11/14 we received a referral to schedule for therapy on his knee.  He came before and had an eval but we didn't make any appts because he was receiving home health.  Want to find out if he does need to come now for therapy.

## 2016-05-16 NOTE — Therapy (Signed)
Decreased scar mobility, Pain  Visit Diagnosis: Stiffness of right knee, not elsewhere classified - Plan: PT plan of care cert/re-cert  Localized edema - Plan: PT plan of care  cert/re-cert  Muscle weakness (generalized) - Plan: PT plan of care cert/re-cert      G-Codes - 47/42/59 1629    Functional Assessment Tool Used Clinical Judgment    Functional Limitation Mobility: Walking and moving around   Mobility: Walking and Moving Around Current Status [phone]) At least 20 percent but less than 40 percent impaired, limited or restricted   Mobility: Walking and Moving Around Goal Status [phone]) At least 1 percent but less than 20 percent impaired, limited or restricted       Problem List Patient Active Problem List   Diagnosis Date Noted  . NAFLD (nonalcoholic fatty liver disease) 04/04/2016  . Unsteadiness on feet   . Staphylococcus aureus bacteremia with sepsis ([place])   . Prosthetic joint infection, initial encounter ([place])   . Knee pain, acute   . Pyogenic arthritis of right knee joint ([place]) 04/02/2016  . Thrombocytopenia ([place])   . Hypomagnesemia   . Cardiac arrest ([place]) 02/22/2016  . Encounter for central line placement   . Shock circulatory ([place])   . Vitiligo 08/22/2015  . AD (atopic dermatitis) 08/22/2015  . Osteoarthritis of right knee 08/22/2015  . Cirrhosis of liver without ascites ([place]) 08/22/2015  . Hyperglycemia 08/22/2015  . GERD (gastroesophageal reflux disease) 08/22/2015  . Ganglion of left wrist 08/22/2015  . ED (erectile dysfunction) of organic origin 08/22/2015  . Asthma 08/22/2015  . Dysplastic nodule of liver 08/22/2015  . Baker cyst 09/06/2014  . H/O total knee replacement 08/05/2014  . Bilateral inguinal hernia 08/01/2012  . Splenomegaly 08/01/2012  . Portal hypertension ([place]) 08/01/2012  . Gallstones 08/01/2012   4:33 PM, 05/16/16 Etta Grandchild, PT, DPT Physical Therapist at [facility] [phone] (office)     [place] [address] [place], [place], [address] Phone: [phone]   Fax:  [phone]  Name: Stephen Burch MRN:  [id number] Date of Birth: 1955-12-20  Decreased scar mobility, Pain  Visit Diagnosis: Stiffness of right knee, not elsewhere classified - Plan: PT plan of care cert/re-cert  Localized edema - Plan: PT plan of care  cert/re-cert  Muscle weakness (generalized) - Plan: PT plan of care cert/re-cert      G-Codes - 47/42/59 1629    Functional Assessment Tool Used Clinical Judgment    Functional Limitation Mobility: Walking and moving around   Mobility: Walking and Moving Around Current Status [phone]) At least 20 percent but less than 40 percent impaired, limited or restricted   Mobility: Walking and Moving Around Goal Status [phone]) At least 1 percent but less than 20 percent impaired, limited or restricted       Problem List Patient Active Problem List   Diagnosis Date Noted  . NAFLD (nonalcoholic fatty liver disease) 04/04/2016  . Unsteadiness on feet   . Staphylococcus aureus bacteremia with sepsis ([place])   . Prosthetic joint infection, initial encounter ([place])   . Knee pain, acute   . Pyogenic arthritis of right knee joint ([place]) 04/02/2016  . Thrombocytopenia ([place])   . Hypomagnesemia   . Cardiac arrest ([place]) 02/22/2016  . Encounter for central line placement   . Shock circulatory ([place])   . Vitiligo 08/22/2015  . AD (atopic dermatitis) 08/22/2015  . Osteoarthritis of right knee 08/22/2015  . Cirrhosis of liver without ascites ([place]) 08/22/2015  . Hyperglycemia 08/22/2015  . GERD (gastroesophageal reflux disease) 08/22/2015  . Ganglion of left wrist 08/22/2015  . ED (erectile dysfunction) of organic origin 08/22/2015  . Asthma 08/22/2015  . Dysplastic nodule of liver 08/22/2015  . Baker cyst 09/06/2014  . H/O total knee replacement 08/05/2014  . Bilateral inguinal hernia 08/01/2012  . Splenomegaly 08/01/2012  . Portal hypertension ([place]) 08/01/2012  . Gallstones 08/01/2012   4:33 PM, 05/16/16 Etta Grandchild, PT, DPT Physical Therapist at [facility] [phone] (office)     [place] [address] [place], [place], [address] Phone: [phone]   Fax:  [phone]  Name: Stephen Burch MRN:  [id number] Date of Birth: 1955-12-20  [place], [place], [address] Phone: [phone]   Fax:  [phone]  Physical Therapy Evaluation  Patient Details  Name: Stephen Burch MRN: [id number] Date of Birth: 1956/01/23 Referring Provider: Augustin Coupe   Encounter Date: 05/16/2016      PT End of Session - 05/16/16 1611    Visit Number 1   Number of Visits 12   Date for PT Re-Evaluation 06/05/16   Authorization Type 05/16/16-06/27/16   Authorization Time Period BC[employer]- Medicare    Authorization - Visit Number 1   Authorization - Number of Visits 10   PT Start Time 6378   PT Stop Time 5885   PT Time Calculation (min) 34 min   Activity Tolerance Patient tolerated treatment well;No increased pain   Behavior During Therapy WFL for tasks assessed/performed      Past Medical History:  Diagnosis Date  . Arthritis    knees  . Asthma    as a kid  . Cirrhosis ([place])   . GERD (gastroesophageal reflux disease)   . History of hiatal hernia   . Hx of cardiac arrest   . Kidney stones   . Liver cirrhosis ([place])   . NAFLD (nonalcoholic fatty liver disease) 04/04/2016  . Pneumonia    3 years ago  . Thrombocytopenia ([place])     Past Surgical History:  Procedure Laterality Date  . APPLICATION OF WOUND VAC Right 02/22/2016   Procedure: APPLICATION OF WOUND VAC;  Surgeon: Vickey Huger, MD;  Location: [facility];  Service: Orthopedics;  Laterality: Right;  . CARDIAC CATHETERIZATION N/A 02/24/2016   Procedure: Left Heart Cath and Coronary Angiography;  Surgeon: Burnell Blanks, MD;  Location: [facility] [facility];  Service: Cardiovascular;  Laterality: N/A;  . COLONOSCOPY    . CT SCAN of Abdomen  06/27/2012   Cirrhosis with portal hypertension. Marked slenomegaly. Bilateral inguinal hernias. Right inguinal hernia contains part of the bladder. Report from [place] Right 09/06/2014   Procedure: OPEN EXCISION BAKER'S CYST RIGHT KNEE;  Surgeon:  Vickey Huger, MD;  Location: [facility];  Service: Orthopedics;  Laterality: Right;  . [place]  . ESOPHAGOGASTRODUODENOSCOPY N/A 09/17/2012   Procedure: ESOPHAGOGASTRODUODENOSCOPY (EGD);  Surgeon: Rogene Houston, MD;  [facility];  Service: Endoscopy;  Laterality: N/A;  200  . ESOPHAGOGASTRODUODENOSCOPY N/A 12/15/2015   Procedure: ESOPHAGOGASTRODUODENOSCOPY (EGD);  Surgeon: Rogene Houston, MD;  [facility];  Service: Endoscopy;  Laterality: N/A;  1245  . Exercise tredmill test  03/27/2005   Normal  . HERNIA REPAIR Bilateral 05/22/2013, 11/1997   x 2 (Inguinal hernia) Dr. Dalbert Batman, Dr. Jacqlyn Larsen  . I&D KNEE WITH POLY EXCHANGE Right 04/02/2016   Procedure: IRRIGATION AND DEBRIDEMENT RIGHT KNEE WITH POLY EXCHANGE;  Surgeon: Vickey Huger, MD;  Location: [facility];  Service: Orthopedics;  Laterality: Right;  . INGUINAL HERNIA REPAIR Bilateral 05/22/2013   Procedure: REPAIR OF RECURRENT INCARCERATED INGUINAL HERNIA WITH MESH RIGHT SIDE,  REPAIR OF RECURRENT INGUINAL HERNIA WITH MESH LEFT SIDE;  Surgeon: [name], MD;  Location: [place];  Service: General;  Laterality: Bilateral;  . INSERTION OF MESH Bilateral 05/22/2013   Procedure: INSERTION OF MESH;  Surgeon: [name], MD;  Location: [facility];  Service: General;  Laterality: Bilateral;  . IRRIGATION AND DEBRIDEMENT KNEE Right 02/22/2016   Procedure: IRRIGATION AND DEBRIDEMENT KNEE;  Surgeon: Vickey Huger, MD;  Location: [place];  [place], [place], [address] Phone: [phone]   Fax:  [phone]  Physical Therapy Evaluation  Patient Details  Name: Stephen Burch MRN: [id number] Date of Birth: 1956/01/23 Referring Provider: Augustin Coupe   Encounter Date: 05/16/2016      PT End of Session - 05/16/16 1611    Visit Number 1   Number of Visits 12   Date for PT Re-Evaluation 06/05/16   Authorization Type 05/16/16-06/27/16   Authorization Time Period BCBS- Medicare    Authorization - Visit Number 1   Authorization - Number of Visits 10   PT Start Time 6378   PT Stop Time 5885   PT Time Calculation (min) 34 min   Activity Tolerance Patient tolerated treatment well;No increased pain   Behavior During Therapy WFL for tasks assessed/performed      Past Medical History:  Diagnosis Date  . Arthritis    knees  . Asthma    as a kid  . Cirrhosis ([place])   . GERD (gastroesophageal reflux disease)   . History of hiatal hernia   . Hx of cardiac arrest   . Kidney stones   . Liver cirrhosis ([place])   . NAFLD (nonalcoholic fatty liver disease) 04/04/2016  . Pneumonia    3 years ago  . Thrombocytopenia ([place])     Past Surgical History:  Procedure Laterality Date  . APPLICATION OF WOUND VAC Right 02/22/2016   Procedure: APPLICATION OF WOUND VAC;  Surgeon: Vickey Huger, MD;  Location: [facility];  Service: Orthopedics;  Laterality: Right;  . CARDIAC CATHETERIZATION N/A 02/24/2016   Procedure: Left Heart Cath and Coronary Angiography;  Surgeon: Burnell Blanks, MD;  Location: [facility];  Service: Cardiovascular;  Laterality: N/A;  . COLONOSCOPY    . CT SCAN of Abdomen  06/27/2012   Cirrhosis with portal hypertension. Marked slenomegaly. Bilateral inguinal hernias. Right inguinal hernia contains part of the bladder. Report from [place] Right 09/06/2014   Procedure: OPEN EXCISION BAKER'S CYST RIGHT KNEE;  Surgeon:  Vickey Huger, MD;  Location: [facility];  Service: Orthopedics;  Laterality: Right;  . [place]  . ESOPHAGOGASTRODUODENOSCOPY N/A 09/17/2012   Procedure: ESOPHAGOGASTRODUODENOSCOPY (EGD);  Surgeon: Rogene Houston, MD;  [facility];  Service: Endoscopy;  Laterality: N/A;  200  . ESOPHAGOGASTRODUODENOSCOPY N/A 12/15/2015   Procedure: ESOPHAGOGASTRODUODENOSCOPY (EGD);  Surgeon: Rogene Houston, MD;  [facility];  Service: Endoscopy;  Laterality: N/A;  1245  . Exercise tredmill test  03/27/2005   Normal  . HERNIA REPAIR Bilateral 05/22/2013, 11/1997   x 2 (Inguinal hernia) Dr. Dalbert Batman, Dr. Jacqlyn Larsen  . I&D KNEE WITH POLY EXCHANGE Right 04/02/2016   Procedure: IRRIGATION AND DEBRIDEMENT RIGHT KNEE WITH POLY EXCHANGE;  Surgeon: Vickey Huger, MD;  Location: [facility];  Service: Orthopedics;  Laterality: Right;  . INGUINAL HERNIA REPAIR Bilateral 05/22/2013   Procedure: REPAIR OF RECURRENT INCARCERATED INGUINAL HERNIA WITH MESH RIGHT SIDE,  REPAIR OF RECURRENT INGUINAL HERNIA WITH MESH LEFT SIDE;  Surgeon: [name], MD;  Location: [facility];  Service: General;  Laterality: Bilateral;  . INSERTION OF MESH Bilateral 05/22/2013   Procedure: INSERTION OF MESH;  Surgeon: [name], MD;  Location: [facility];  Service: General;  Laterality: Bilateral;  . IRRIGATION AND DEBRIDEMENT KNEE Right 02/22/2016   Procedure: IRRIGATION AND DEBRIDEMENT KNEE;  Surgeon: Vickey Huger, MD;  Location: [facility];

## 2016-05-17 ENCOUNTER — Ambulatory Visit (HOSPITAL_COMMUNITY): Payer: Medicare Other | Admitting: Physical Therapy

## 2016-05-17 DIAGNOSIS — R600 Localized edema: Secondary | ICD-10-CM

## 2016-05-17 DIAGNOSIS — M25661 Stiffness of right knee, not elsewhere classified: Secondary | ICD-10-CM

## 2016-05-17 DIAGNOSIS — M6281 Muscle weakness (generalized): Secondary | ICD-10-CM

## 2016-05-17 NOTE — Therapy (Addendum)
PHYSICAL THERAPY DISCHARGE SUMMARY  Visits from Start of Care: 2  Pt reports that he has joined a gym and will do his exercises there and does not with to return.   Current functional level related to goals / functional outcomes: *See below    Remaining deficits: *See below    Education / Equipment: *See below  Plan: Patient agrees to discharge.  Patient goals were not met. Patient is being discharged due to the patient's request.  ?????              06/28/2016 1211  PT G-Codes  Functional Assessment Tool Used Clinical Judgment   Functional Limitation Mobility: Walking and moving around  Mobility: Walking and Moving Around Goal Status [phone]) CI  Mobility: Walking and Moving Around Discharge Status [phone]) CI     12:10 PM, Jun 28, 2016 Rosamaria Lints, PT, DPT Physical Therapist at [facility] [phone] (office)               [facility] [address] [place], [place], [address] Phone: [phone]   Fax:  [phone]  Physical Therapy Treatment  Patient Details  Name: Stephen Burch MRN: [id number] Date of Birth: Nov 14, 1955 Referring Provider: Quintella Reichert   Encounter Date: 05/17/2016      PT End of Session - 05/17/16 0859    Visit Number 2   Number of Visits 12   Date for PT Re-Evaluation 06/05/16   Authorization Type 05/16/16-06/27/16   Authorization Time Period [employer]- Medicare    Authorization - Visit Number 2   Authorization - Number of Visits 10   PT Start Time 0818   PT Stop Time 0856   PT Time Calculation (min) 38 min   Activity Tolerance Patient tolerated treatment well;No increased pain   Behavior During Therapy WFL for tasks assessed/performed      Past Medical History:  Diagnosis Date  . Arthritis    knees  . Asthma    as a kid  . Cirrhosis (HCC)   . GERD (gastroesophageal reflux disease)   . History of hiatal hernia   . Hx of cardiac arrest   .  Kidney stones   . Liver cirrhosis (HCC)   . NAFLD (nonalcoholic fatty liver disease) 53/12/6438  . Pneumonia    3 years ago  . Thrombocytopenia (HCC)     Past Surgical History:  Procedure Laterality Date  . APPLICATION OF WOUND VAC Right 02/22/2016   Procedure: APPLICATION OF WOUND VAC;  Surgeon: Dannielle Huh, MD;  Location: [facility] OR;  Service: Orthopedics;  Laterality: Right;  . CARDIAC CATHETERIZATION N/A 02/24/2016   Procedure: Left Heart Cath and Coronary Angiography;  Surgeon: Kathleene Hazel, MD;  Location: [facility] INVASIVE CV LAB;  Service: Cardiovascular;  Laterality: N/A;  . COLONOSCOPY    . CT SCAN of Abdomen  06/27/2012   Cirrhosis with portal hypertension. Marked slenomegaly. Bilateral inguinal hernias. Right inguinal hernia contains part of the bladder. Report from [facility]  . EAR CYST EXCISION Right 09/06/2014   Procedure: OPEN EXCISION BAKER'S CYST RIGHT KNEE;  Surgeon: Dannielle Huh, MD;  Location: [facility] OR;  Service: Orthopedics;  Laterality: Right;  . ESOPHAGEAL DILATION  1996  . ESOPHAGOGASTRODUODENOSCOPY N/A 09/17/2012   Procedure: ESOPHAGOGASTRODUODENOSCOPY (EGD);  Surgeon: Malissa Hippo, MD;  Location: [facility] ENDO SUITE;  Service: Endoscopy;  Laterality: N/A;  200  . ESOPHAGOGASTRODUODENOSCOPY N/A 12/15/2015   Procedure: ESOPHAGOGASTRODUODENOSCOPY (EGD);  Surgeon: Malissa Hippo, MD;  Location: [facility];  Service: Endoscopy;  Laterality: N/A;  1245  . Exercise tredmill test  03/27/2005   Normal  . HERNIA REPAIR Bilateral 05/22/2013, 11/1997   x 2 (Inguinal hernia) Dr. Derrell Lolling, Dr. Achilles Dunk  . I&D KNEE WITH POLY EXCHANGE Right 04/02/2016   Procedure: IRRIGATION AND DEBRIDEMENT RIGHT KNEE WITH POLY EXCHANGE;  Surgeon: Dannielle Huh, MD;  Location: [facility] OR;  Service: Orthopedics;  Laterality: Right;  . INGUINAL HERNIA REPAIR Bilateral 05/22/2013   Procedure: REPAIR OF RECURRENT INCARCERATED INGUINAL HERNIA WITH MESH RIGHT SIDE,  REPAIR OF RECURRENT INGUINAL HERNIA WITH MESH  LEFT SIDE;  Surgeon: Ernestene Mention, MD;  Location: [facility] OR;  Service: General;  Laterality: Bilateral;  . INSERTION OF MESH Bilateral 05/22/2013   Procedure: INSERTION OF MESH;  Surgeon: Ernestene Mention, MD;  Location: [facility] OR;  Service: General;  Laterality: Bilateral;  . IRRIGATION AND DEBRIDEMENT KNEE Right 02/22/2016   Procedure: IRRIGATION AND DEBRIDEMENT KNEE;  Surgeon: Dannielle Huh, MD;  Location: [facility] OR;  Service: Orthopedics;  Laterality: Right;  . Irrigation and Debridement right knee  03/23/2009   Dr. Hyacinth Meeker, [facility]  . JOINT REPLACEMENT  03/16/2009   RIGHT KNEE REPLACEMENT  . KNEE ARTHROSCOPY WITH EXCISION BAKER'S CYST Right 10/01/14  . KNEE SURGERY    . Radionuclide Imaging Study     Normal LV systolic function with ejection of 50%; Normal myocardial perfusion without evidence of myocardial ischemia  . REPLACEMENT TOTAL KNEE     Bilateral knee replacement, 2010, 2012  . TONSILLECTOMY      There were no vitals filed for this visit.      Subjective Assessment - 05/17/16 0930    Pertinent History 6 SX on Rt knee with last one resulting in cardiac arrest, Lt TKA in 2010, Baker's Cyst removal in August 2017 with susequent wound vac for infection control.                          [facility] Adult PT Treatment/Exercise - 05/17/16 0001      Knee/Hip Exercises: Seated   Long Arc Quad Right;1 set;15 reps;AROM   Long Arc Quad Weight 5 lbs.   Long [employer] Limitations review for HEP   Sit to [employer] 1 set;10 reps;without UE support     Knee/Hip Exercises: Supine   Heel Slides AROM;Right   Heel Slides Limitations supine and seated   Straight Leg Raises Right;1 set;15 reps     Manual Therapy   Manual Therapy Edema management   Manual therapy comments manual completed seperate from all other skilled interventions   Edema Management retro massage to Rt knee with elevation                PT Education - 05/17/16 0857    Education provided Yes   Education Details  reviewed HEP, intial evaluation with goals.  Educated on compression garments to help with blood clot prevention and reduction of edema.  given info sheet for [employer] therapy and recommendations for 20-45mmHg thigh highs   Person(s) Educated Patient   Methods Explanation;Demonstration;Tactile cues;Verbal cues;Handout   Comprehension Verbalized understanding;Returned demonstration;Verbal cues required;Tactile cues required;Need further instruction          PT Short Term Goals - 05/16/16 1622      PT SHORT TERM GOAL #1   Title After 3 weeks pt will demonstrate imrpoved gait tolerance and speed AEB c distance >1136ft and 10MWT>1.72m/s.    Status New  PT SHORT TERM GOAL #2   Title After 3 weeks pt will demonstrate improved functional strength AEB 5xSTS hands free in <18seconds.    Status New     PT SHORT TERM GOAL #3   Title After 3 weeks patient will demonstrate reduced joint effusion AEB circumference decrease of >2cm, and Rt knee flexion ROM >3-105 degrees.    Status New           PT Long Term Goals - 06-15-2016 1626      PT LONG TERM GOAL #1   Title After 6 weeks pt will demonstrate imrpoved gait tolerance and speed AEB c distance >1592ft and 10MWT>1.37m/s.    Status New     PT LONG TERM GOAL #2   Title After 6 weeks pt will demonstrate improved functional strength AEB 5xSTS hands free in <11 seconds.    Status New     PT LONG TERM GOAL #3   Title After 6 weeks patient will demonstrate reduced joint effusion AEB circumference decrease of >3cm, and Rt knee flexion ROM >3-113 degrees.    Status New               Plan - 05/17/16 0900    Clinical Impression Statement Reviewed HEP and initial evaluation including goals. Pt able to demonstrtate established HEP independenlty and correctly.  Added weight to LAQ and issued SLR to HEP as intstructed by evaluating therapist.  Began retro massage to help reduce edema in Rt knee.  Educated on compression  garments and suggested to acquire to help with edema.  Pt given all handouts.     Rehab Potential Good   PT Frequency 2x / week   PT Duration 6 weeks   PT Treatment/Interventions ADLs/Self Care Home Management;Gait training;Stair training;Functional mobility training;Patient/family education;Balance training;Therapeutic exercise;Therapeutic activities;Manual techniques;Scar mobilization;Passive range of motion   PT Next Visit Plan Next session add Lateral side stepping with band and clamshells with band.  Progress to standing functional strengthening when ready.  Continue with manual.   PT Home Exercise Plan At eval: LAQ, heel slides (x 3 minutes, seated and/or supine), STS; 11/16:  SLR   Consulted and Agree with Plan of Care Patient      Patient will benefit from skilled therapeutic intervention in order to improve the following deficits and impairments:  Abnormal gait, Decreased range of motion, Difficulty walking, Decreased activity tolerance, Decreased knowledge of precautions, Decreased balance, Decreased knowledge of use of DME, Decreased mobility, Decreased strength, Increased edema, Impaired flexibility, Hypomobility, Decreased scar mobility, Pain  Visit Diagnosis: Stiffness of right knee, not elsewhere classified  Localized edema  Muscle weakness (generalized)       G-Codes - Jun 15, 2016 1629    Functional Assessment Tool Used Clinical Judgment    Functional Limitation Mobility: Walking and moving around   Mobility: Walking and Moving Around Current Status [phone]) At least 20 percent but less than 40 percent impaired, limited or restricted   Mobility: Walking and Moving Around Goal Status [phone]) At least 1 percent but less than 20 percent impaired, limited or restricted      Problem List Patient Active Problem List   Diagnosis Date Noted  . NAFLD (nonalcoholic fatty liver disease) 32/35/5732  . Unsteadiness on feet   . Staphylococcus aureus bacteremia with sepsis (HCC)   .  Prosthetic joint infection, initial encounter (HCC)   . Knee pain, acute   . Pyogenic arthritis of right knee joint (HCC) 04/02/2016  . Thrombocytopenia (HCC)   . Hypomagnesemia   .  Cardiac arrest (HCC) 02/22/2016  . Encounter for central line placement   . Shock circulatory (HCC)   . Vitiligo 08/22/2015  . AD (atopic dermatitis) 08/22/2015  . Osteoarthritis of right knee 08/22/2015  . Cirrhosis of liver without ascites (HCC) 08/22/2015  . Hyperglycemia 08/22/2015  . GERD (gastroesophageal reflux disease) 08/22/2015  . Ganglion of left wrist 08/22/2015  . ED (erectile dysfunction) of organic origin 08/22/2015  . Asthma 08/22/2015  . Dysplastic nodule of liver 08/22/2015  . Baker cyst 09/06/2014  . H/O total knee replacement 08/05/2014  . Bilateral inguinal hernia 08/01/2012  . Splenomegaly 08/01/2012  . Portal hypertension (HCC) 08/01/2012  . Gallstones 08/01/2012    Lurena Nida, PTA/CLT [phone]  05/17/2016, 9:31 AM  [facility] [address] [place], [place], [address] Phone: [phone]   Fax:  [phone]  Name: YITZCHAK ALDAZ MRN: [id number] Date of Birth: [birth year]

## 2016-05-29 ENCOUNTER — Ambulatory Visit (HOSPITAL_COMMUNITY): Payer: Medicare Other

## 2016-05-30 ENCOUNTER — Encounter (INDEPENDENT_AMBULATORY_CARE_PROVIDER_SITE_OTHER): Payer: Self-pay | Admitting: *Deleted

## 2016-05-31 ENCOUNTER — Encounter (HOSPITAL_COMMUNITY): Payer: Self-pay | Admitting: Physical Therapy

## 2016-05-31 DIAGNOSIS — M25661 Stiffness of right knee, not elsewhere classified: Secondary | ICD-10-CM | POA: Diagnosis not present

## 2016-05-31 NOTE — Progress Notes (Signed)
   05/31/16 1211  PT G-Codes  Functional Assessment Tool Used Clinical Judgment   Functional Limitation Mobility: Walking and moving around  Mobility: Walking and Moving Around Goal Status [phone]) CI  Mobility: Walking and Moving Around Discharge Status [phone]) CI

## 2016-06-05 ENCOUNTER — Encounter (HOSPITAL_COMMUNITY): Payer: Self-pay

## 2016-06-08 ENCOUNTER — Encounter (HOSPITAL_COMMUNITY): Payer: Self-pay

## 2016-06-12 ENCOUNTER — Encounter (INDEPENDENT_AMBULATORY_CARE_PROVIDER_SITE_OTHER): Payer: Self-pay | Admitting: *Deleted

## 2016-06-12 ENCOUNTER — Encounter (INDEPENDENT_AMBULATORY_CARE_PROVIDER_SITE_OTHER): Payer: Self-pay | Admitting: Internal Medicine

## 2016-06-12 ENCOUNTER — Ambulatory Visit (INDEPENDENT_AMBULATORY_CARE_PROVIDER_SITE_OTHER): Payer: Medicare Other | Admitting: Internal Medicine

## 2016-06-12 ENCOUNTER — Encounter (HOSPITAL_COMMUNITY): Payer: Self-pay | Admitting: Physical Therapy

## 2016-06-12 VITALS — BP 110/70 | HR 66 | Temp 97.7°F | Resp 18 | Ht 72.0 in | Wt 217.1 lb

## 2016-06-12 DIAGNOSIS — K2270 Barrett's esophagus without dysplasia: Secondary | ICD-10-CM

## 2016-06-12 DIAGNOSIS — K7460 Unspecified cirrhosis of liver: Secondary | ICD-10-CM

## 2016-06-12 DIAGNOSIS — K219 Gastro-esophageal reflux disease without esophagitis: Secondary | ICD-10-CM | POA: Diagnosis not present

## 2016-06-12 NOTE — Patient Instructions (Signed)
Pantoprazole at least every other day while on antibiotics. AFP to be done with next blood draw. Right upper quadrant ultrasound to be scheduled.

## 2016-06-12 NOTE — Progress Notes (Signed)
Presenting complaint;  Follow-up for chronic liver disease and GERD.  Database and Subjective:  Stephen Burch is 60 year old Caucasian male who is here for scheduled visit. He was last seen 3 months ago when he was complaining of right-sided abdominal pain felt to be musculoskeletal. He also had elevated transaminases felt to be recent event when he had cardiac arrest while undergoing right knee surgery for Baker's cyst. His LFTs returned to normal. Patient was admitted to: Hospital on 04/01/2016 with pyogenic arthritis involving prosthetic right knee. He underwent irrigation and debridement on the day of admission. Blood cultures are positive. He was treated with IV cefazolin and then transition to oral cephalexin with right rifampin. He is to continue oral antibiotics for 3 months. He feels much better. He has not had any more right-sided abdominal pain. He has some soreness and stiffness to right knee joint. He has been going to the gym 3-4 times a week she started 3 weeks ago. He states he has not been taking pantoprazole since he has been on antibiotic for fear of interaction. He is watching his diet and has not had any heartburn. He also denies melena or rectal bleeding. He has gained 4 pounds since his last visit.    Current Medications: Outpatient Encounter Prescriptions as of 06/12/2016  Medication Sig  . cephALEXin (KEFLEX) 500 MG capsule Take 1 capsule (500 mg total) by mouth 3 (three) times daily.  . rifampin (RIFADIN) 300 MG capsule Take 2 capsules (600 mg total) by mouth daily.  . pantoprazole (PROTONIX) 40 MG tablet Take 1 tablet (40 mg total) by mouth daily. (Patient not taking: Reported on 06/12/2016)   No facility-administered encounter medications on file as of 06/12/2016.      Objective: Blood pressure 110/70, pulse 66, temperature 97.7 F (36.5 C), temperature source Oral, resp. rate 18, height 6' (1.829 m), weight 217 lb 1.6 oz (98.5 kg). Patient is alert and in no acute  distress. Asterixis absent. Conjunctiva is pink. Sclera is nonicteric Oropharyngeal mucosa is normal. No neck masses or thyromegaly noted. Cardiac exam with regular rhythm normal S1 and S2. No murmur or gallop noted. Lungs are clear to auscultation. Abdomen is full but soft and nontender without organomegaly or masses. No LE edema or clubbing noted. Right knee is swollen compared to the left with fresh scar posteriorly. Effusion absent.  Labs/studies Results: Lab data from 05/15/2016  WBC 3.2, H&H 12.8 and 39.5 and platelet count 113 K.  Glucose 112  BUN 11 and creatinine 0.81  Bilirubin 0.7, AP 173, AST 28, ALT 12, total protein 7 and albumin 3.9.  Serum calcium 8.6.    Assessment:  #1. Nonalcoholic cirrhosis. He has well preserved hepatic function. Mildly elevated serum alkaline phosphatase with downward trend most likely due to prostatic right knee infection. He is due for [facility] screening.  #2. GERD complicated by short segment Barrett's esophagus. He has not taken PPI since he was begun on by mouth antibiotics for knee infection. Given that patient has biopsy-proven Barrett's he needs to be back on PPI. He can try every other day and see how he does.  #3. Thrombocytopenia and mild leukopenia secondary to cirrhosis/splenomegaly. Mild anemia secondary to acute illness. Hemoglobin showed gradually returned to normal.  Plan:  Right upper quadrant abdominal ultrasound. Patient will go to the lab for AFP. Patient advised to resume pantoprazole 40 mg by mouth daily if not daily. He will have lab studies and ultrasound repeated in 6 months and return for office visit in  one year.

## 2016-06-15 ENCOUNTER — Ambulatory Visit (HOSPITAL_COMMUNITY)
Admission: RE | Admit: 2016-06-15 | Discharge: 2016-06-15 | Disposition: A | Discharge status: Home / Self Care | Payer: Medicare Other | Source: Ambulatory Visit | Attending: Internal Medicine | Admitting: Internal Medicine

## 2016-06-15 ENCOUNTER — Encounter (HOSPITAL_COMMUNITY): Payer: Self-pay

## 2016-06-15 DIAGNOSIS — K7460 Unspecified cirrhosis of liver: Secondary | ICD-10-CM | POA: Insufficient documentation

## 2016-06-15 DIAGNOSIS — K769 Liver disease, unspecified: Secondary | ICD-10-CM | POA: Diagnosis not present

## 2016-06-18 ENCOUNTER — Encounter: Payer: Self-pay | Admitting: Internal Medicine

## 2016-06-18 ENCOUNTER — Ambulatory Visit (INDEPENDENT_AMBULATORY_CARE_PROVIDER_SITE_OTHER): Payer: Medicare Other | Admitting: Internal Medicine

## 2016-06-18 VITALS — BP 176/95 | HR 111 | Temp 98.2°F | Ht 72.0 in | Wt 216.5 lb

## 2016-06-18 DIAGNOSIS — T8453XD Infection and inflammatory reaction due to internal right knee prosthesis, subsequent encounter: Secondary | ICD-10-CM | POA: Diagnosis not present

## 2016-06-18 DIAGNOSIS — Z23 Encounter for immunization: Secondary | ICD-10-CM

## 2016-06-18 NOTE — Progress Notes (Signed)
[facility] for Infectious Disease  Patient Active Problem List   Diagnosis Date Noted  . Staphylococcus aureus bacteremia with sepsis [facility])     Priority: High  . Infection of prosthetic right knee joint ([place])     Priority: High  . Pyogenic arthritis of right knee joint ([place]) 04/02/2016    Priority: High  . NAFLD (nonalcoholic fatty liver disease) 04/04/2016  . Unsteadiness on feet   . Knee pain, acute   . Thrombocytopenia ([place])   . Hypomagnesemia   . Cardiac arrest ([place]) 02/22/2016  . Encounter for central line placement   . Shock circulatory ([place])   . Vitiligo 08/22/2015  . AD (atopic dermatitis) 08/22/2015  . Osteoarthritis of right knee 08/22/2015  . Cirrhosis of liver without ascites ([place]) 08/22/2015  . Hyperglycemia 08/22/2015  . GERD (gastroesophageal reflux disease) 08/22/2015  . Ganglion of left wrist 08/22/2015  . ED (erectile dysfunction) of organic origin 08/22/2015  . Asthma 08/22/2015  . Dysplastic nodule of liver 08/22/2015  . Baker cyst 09/06/2014  . H/O total knee replacement 08/05/2014  . Bilateral inguinal hernia 08/01/2012  . Splenomegaly 08/01/2012  . Portal hypertension ([place]) 08/01/2012  . Gallstones 08/01/2012    Patient's Medications  New Prescriptions   No medications on file  Previous Medications   CEPHALEXIN (KEFLEX) 500 MG CAPSULE    Take 1 capsule (500 mg total) by mouth 3 (three) times daily.   PANTOPRAZOLE (PROTONIX) 40 MG TABLET    Take 1 tablet (40 mg total) by mouth daily.   RIFAMPIN (RIFADIN) 300 MG CAPSULE    Take 2 capsules (600 mg total) by mouth daily.  Modified Medications   No medications on file  Discontinued Medications   No medications on file    Subjective: Mr. Hamman is in for his routine follow-up visit. He is now completed 2 and half months of therapy for his MSSA bacteremia and possible right prosthetic knee infection. He is tolerating cephalexin and rifampin without any difficulty. He still has some  mild swelling in his right knee that gets worse after standing for long periods but he's having minimal pain. He is not requiring any pain medication.  Review of Systems: Review of Systems  Constitutional: Negative for chills, diaphoresis, fever, malaise/fatigue and weight loss.  Gastrointestinal: Negative for abdominal pain, diarrhea, heartburn, nausea and vomiting.  Musculoskeletal: Negative for joint pain and myalgias.  Skin: Negative for rash.    Past Medical History:  Diagnosis Date  . Arthritis    knees  . Asthma    as a kid  . Cirrhosis ([place])   . GERD (gastroesophageal reflux disease)   . History of hiatal hernia   . Hx of cardiac arrest   . Kidney stones   . Liver cirrhosis ([place])   . NAFLD (nonalcoholic fatty liver disease) 04/04/2016  . Pneumonia    3 years ago  . Thrombocytopenia ([place])     Social History  Substance Use Topics  . Smoking status: Never Smoker  . Smokeless tobacco: Never Used  . Alcohol use No    Family History  Problem Relation Age of Onset  . Diabetes Mother     type 2, born 6  . Arthritis Father     died age 87  . GI Bleed Father     upper GI Bleed, non-ETOH cirrhosis  . Arthritis Brother   . Diabetes Brother     type 2  . Diabetes Paternal Uncle  type 2  . Heart attack Maternal Grandmother   . Heart attack Maternal Grandfather   . Colon cancer Neg Hx   . Colon polyps Neg Hx     Allergies  Allergen Reactions  . Asa [Aspirin] Shortness Of Breath  . Penicillins Shortness Of Breath and Rash    Has patient had a PCN reaction causing immediate rash, facial/tongue/throat swelling, SOB or lightheadedness with hypotension: Yes Has patient had a PCN reaction causing severe rash involving mucus membranes or skin necrosis: No Has patient had a PCN reaction that required hospitalization No Has patient had a PCN reaction occurring within the last 10 years: No If all of the above answers are "NO", then may proceed with Cephalosporin  use.   . Vancomycin Anaphylaxis    Immediately following Vancomycin administration in the OR patient Vfib arrested.    Objective: Vitals:   06/18/16 1419 06/18/16 1425  BP: (!) 148/95 (!) 176/95  Pulse: (!) 102 (!) 111  Temp: 98.2 F (36.8 C)   TempSrc: Oral   Weight: 216 lb 8 oz (98.2 kg)   Height: 6' (1.829 m)    Body mass index is 29.36 kg/m.  Physical Exam  Constitutional: He is oriented to person, place, and time.  He is in good spirits.  Musculoskeletal: Normal range of motion. He exhibits edema. He exhibits no tenderness.  He has 1+ pitting edema of his right leg to the level of the knee. There is no warmth, erythema or pain with palpation. His incisions are nicely healed.  Neurological: He is alert and oriented to person, place, and time.  Skin: No rash noted.  Psychiatric: Mood and affect normal.    Lab Results Sed Rate (mm/hr)  Date Value  05/15/2016 17 (H)  04/10/2016 80 (H)  04/04/2016 60 (H)   CRP (mg/dL)  Date Value  05/15/2016 1.5 (H)  04/10/2016 7.7 (H)  04/04/2016 14.8 (H)     Problem List Items Addressed This Visit      High   Infection of prosthetic right knee joint ([place])    He is improving on therapy for presumed MSSA infection of his right prosthetic knee. I will continue current antibiotics and have him follow-up in 2 months.       Other Visit Diagnoses    Need for prophylactic vaccination and inoculation against influenza    -  Primary   Relevant Orders   Flu vaccine greater than or equal to 3yo preservative free IM       Michel Bickers, MD [facility] for [place] [phone] pager   [phone] cell 06/18/2016, 2:46 PM

## 2016-06-18 NOTE — Assessment & Plan Note (Signed)
He is improving on therapy for presumed MSSA infection of his right prosthetic knee. I will continue current antibiotics and have him follow-up in 2 months.

## 2016-06-19 ENCOUNTER — Ambulatory Visit (HOSPITAL_COMMUNITY): Payer: Self-pay

## 2016-06-20 ENCOUNTER — Encounter (HOSPITAL_COMMUNITY): Payer: Self-pay

## 2016-06-20 LAB — AFP TUMOR MARKER: AFP-Tumor Marker: 2.6 ng/mL (ref <6.1)

## 2016-06-21 ENCOUNTER — Other Ambulatory Visit (INDEPENDENT_AMBULATORY_CARE_PROVIDER_SITE_OTHER): Payer: Self-pay | Admitting: *Deleted

## 2016-06-21 DIAGNOSIS — K7460 Unspecified cirrhosis of liver: Secondary | ICD-10-CM

## 2016-06-21 DIAGNOSIS — K760 Fatty (change of) liver, not elsewhere classified: Secondary | ICD-10-CM

## 2016-06-21 DIAGNOSIS — K769 Liver disease, unspecified: Secondary | ICD-10-CM

## 2016-06-26 ENCOUNTER — Ambulatory Visit (HOSPITAL_COMMUNITY)
Admission: RE | Admit: 2016-06-26 | Discharge: 2016-06-26 | Disposition: A | Discharge status: Home / Self Care | Payer: Medicare Other | Source: Ambulatory Visit | Attending: Internal Medicine | Admitting: Internal Medicine

## 2016-06-26 DIAGNOSIS — K7460 Unspecified cirrhosis of liver: Secondary | ICD-10-CM | POA: Diagnosis present

## 2016-06-26 DIAGNOSIS — K760 Fatty (change of) liver, not elsewhere classified: Secondary | ICD-10-CM | POA: Insufficient documentation

## 2016-06-26 DIAGNOSIS — K824 Cholesterolosis of gallbladder: Secondary | ICD-10-CM | POA: Diagnosis not present

## 2016-06-26 DIAGNOSIS — K769 Liver disease, unspecified: Secondary | ICD-10-CM

## 2016-06-26 DIAGNOSIS — R161 Splenomegaly, not elsewhere classified: Secondary | ICD-10-CM | POA: Diagnosis not present

## 2016-06-26 LAB — POCT I-STAT CREATININE: CREATININE: 0.9 mg/dL (ref 0.61–1.24)

## 2016-06-26 MED ORDER — GADOXETATE DISODIUM 0.25 MMOL/ML IV SOLN
10.0000 mL | Freq: Once | INTRAVENOUS | Status: AC | PRN
  Administered 2016-06-26: 10 mL via INTRAVENOUS

## 2016-06-26 MED ORDER — SODIUM CHLORIDE 0.9 % IV SOLN
INTRAVENOUS | Status: AC
  Filled 2016-06-26: qty 250

## 2016-07-31 ENCOUNTER — Encounter: Payer: Self-pay | Admitting: Internal Medicine

## 2016-08-21 ENCOUNTER — Ambulatory Visit (INDEPENDENT_AMBULATORY_CARE_PROVIDER_SITE_OTHER): Payer: Self-pay | Admitting: Internal Medicine

## 2016-08-21 ENCOUNTER — Encounter: Payer: Self-pay | Admitting: Internal Medicine

## 2016-08-21 DIAGNOSIS — T8453XD Infection and inflammatory reaction due to internal right knee prosthesis, subsequent encounter: Secondary | ICD-10-CM | POA: Diagnosis not present

## 2016-08-21 LAB — COMPREHENSIVE METABOLIC PANEL
ALK PHOS: 104 U/L (ref 40–115)
ALT: 16 U/L (ref 9–46)
AST: 22 U/L (ref 10–35)
Albumin: 4.4 g/dL (ref 3.6–5.1)
BUN: 15 mg/dL (ref 7–25)
CO2: 26 mmol/L (ref 20–31)
CREATININE: 1.02 mg/dL (ref 0.70–1.25)
Calcium: 8.7 mg/dL (ref 8.6–10.3)
Chloride: 106 mmol/L (ref 98–110)
GLUCOSE: 123 mg/dL — AB (ref 65–99)
POTASSIUM: 4.1 mmol/L (ref 3.5–5.3)
SODIUM: 140 mmol/L (ref 135–146)
TOTAL PROTEIN: 6.7 g/dL (ref 6.1–8.1)
Total Bilirubin: 0.5 mg/dL (ref 0.2–1.2)

## 2016-08-21 LAB — CBC
HCT: 46.4 % (ref 38.5–50.0)
Hemoglobin: 15.8 g/dL (ref 13.2–17.1)
MCH: 29.2 pg (ref 27.0–33.0)
MCHC: 34.1 g/dL (ref 32.0–36.0)
MCV: 85.6 fL (ref 80.0–100.0)
MPV: 10 fL (ref 7.5–12.5)
PLATELETS: 77 10*3/uL — AB (ref 140–400)
RBC: 5.42 MIL/uL (ref 4.20–5.80)
RDW: 15.1 % — AB (ref 11.0–15.0)
WBC: 2.7 10*3/uL — ABNORMAL LOW (ref 3.8–10.8)

## 2016-08-21 LAB — SEDIMENTATION RATE: SED RATE: 1 mm/h (ref 0–20)

## 2016-08-21 NOTE — Progress Notes (Addendum)
[facility] for Infectious Disease  Patient Active Problem List   Diagnosis Date Noted  . Staphylococcus aureus bacteremia with sepsis [facility])     Priority: High  . Infection of prosthetic right knee joint ([place])     Priority: High  . Pyogenic arthritis of right knee joint ([place]) 04/02/2016    Priority: High  . NAFLD (nonalcoholic fatty liver disease) 04/04/2016  . Unsteadiness on feet   . Knee pain, acute   . Thrombocytopenia ([place])   . Hypomagnesemia   . Cardiac arrest ([place]) 02/22/2016  . Encounter for central line placement   . Shock circulatory ([place])   . Vitiligo 08/22/2015  . AD (atopic dermatitis) 08/22/2015  . Osteoarthritis of right knee 08/22/2015  . Cirrhosis of liver without ascites ([place]) 08/22/2015  . Hyperglycemia 08/22/2015  . GERD (gastroesophageal reflux disease) 08/22/2015  . Ganglion of left wrist 08/22/2015  . ED (erectile dysfunction) of organic origin 08/22/2015  . Asthma 08/22/2015  . Dysplastic nodule of liver 08/22/2015  . Baker cyst 09/06/2014  . H/O total knee replacement 08/05/2014  . Bilateral inguinal hernia 08/01/2012  . Splenomegaly 08/01/2012  . Portal hypertension ([place]) 08/01/2012  . Gallstones 08/01/2012    Patient's Medications  New Prescriptions   No medications on file  Previous Medications   CEPHALEXIN (KEFLEX) 500 MG CAPSULE    Take 1 capsule (500 mg total) by mouth 3 (three) times daily.   PANTOPRAZOLE (PROTONIX) 40 MG TABLET    Take 1 tablet (40 mg total) by mouth daily.   RIFAMPIN (RIFADIN) 300 MG CAPSULE    Take 2 capsules (600 mg total) by mouth daily.  Modified Medications   No medications on file  Discontinued Medications   No medications on file    Subjective: Stephen Burch is in for his routine follow-up visit. He is now completed 4 and half months of antibiotic therapy for his MSSA bacteremia and right prosthetic knee infection. He has had no problem tolerating his cephalexin or rifampin. He is having minimal  discomfort in his right knee. He is not requiring any pain medication. He still notes swelling in his knee and lower leg, especially after he has been standing for several hours. He wears a compressive stocking on his lower leg and a compressive sleeve over his knee when he is working.  Review of Systems: Review of Systems  Constitutional: Negative for chills, diaphoresis, fever, malaise/fatigue and weight loss.  Gastrointestinal: Negative for abdominal pain, diarrhea, nausea and vomiting.  Musculoskeletal: Positive for joint pain.  Skin: Negative for rash.    Past Medical History:  Diagnosis Date  . Arthritis    knees  . Asthma    as a kid  . Cirrhosis ([place])   . GERD (gastroesophageal reflux disease)   . History of hiatal hernia   . Hx of cardiac arrest   . Kidney stones   . Liver cirrhosis ([place])   . NAFLD (nonalcoholic fatty liver disease) 04/04/2016  . Pneumonia    3 years ago  . Thrombocytopenia ([place])     Social History  Substance Use Topics  . Smoking status: Never Smoker  . Smokeless tobacco: Never Used  . Alcohol use No    Family History  Problem Relation Age of Onset  . Diabetes Mother     type 2, born 69  . Arthritis Father     died age 25  . GI Bleed Father     upper GI Bleed,  non-ETOH cirrhosis  . Arthritis Brother   . Diabetes Brother     type 2  . Diabetes Paternal Uncle     type 2  . Heart attack Maternal Grandmother   . Heart attack Maternal Grandfather   . Colon cancer Neg Hx   . Colon polyps Neg Hx     Allergies  Allergen Reactions  . Asa [Aspirin] Shortness Of Breath  . Penicillins Shortness Of Breath and Rash    Has patient had a PCN reaction causing immediate rash, facial/tongue/throat swelling, SOB or lightheadedness with hypotension: Yes Has patient had a PCN reaction causing severe rash involving mucus membranes or skin necrosis: No Has patient had a PCN reaction that required hospitalization No Has patient had a PCN reaction  occurring within the last 10 years: No If all of the above answers are "NO", then may proceed with Cephalosporin use.   . Vancomycin Anaphylaxis    Immediately following Vancomycin administration in the OR patient Vfib arrested.    Objective: Vitals:   08/21/16 0850  BP: 140/86  Pulse: 78  Temp: 98.2 F (36.8 C)  TempSrc: Oral  Weight: 222 lb (100.7 kg)  Height: 6' (1.829 m)   Body mass index is 30.11 kg/m.  Physical Exam  Constitutional: He is oriented to person, place, and time.  He is in good spirits.  Musculoskeletal: He exhibits edema.  The surgical incision over his right knee is completely healed. He has mild diffuse swelling without unusual warmth. He has good range of motion without pain.  Neurological: He is alert and oriented to person, place, and time.  Skin: No rash noted.  Psychiatric: Mood and affect normal.    Lab Results Sed Rate (mm/hr)  Date Value  05/15/2016 17 (H)  04/10/2016 80 (H)  04/04/2016 60 (H)   CRP (mg/dL)  Date Value  05/15/2016 1.5 (H)  04/10/2016 7.7 (H)  04/04/2016 14.8 (H)     Problem List Items Addressed This Visit      High   Infection of prosthetic right knee joint ([place])    His MSSA prosthetic joint infection is improving after open drainage and polyethylene exchange and 4 and half months of antibiotic therapy. He will follow-up in 6 weeks and we will discuss options including stopping antibiotic therapy at that time. He will get follow-up lab work today.      Relevant Orders   C-reactive protein   Sedimentation rate   CBC   Comprehensive metabolic panel       Stephen Bickers, MD [facility] for [place] [phone] pager   [phone] cell 08/21/2016, 9:30 AM

## 2016-08-21 NOTE — Assessment & Plan Note (Signed)
His MSSA prosthetic joint infection is improving after open drainage and polyethylene exchange and 4 and half months of antibiotic therapy. He will follow-up in 6 weeks and we will discuss options including stopping antibiotic therapy at that time. He will get follow-up lab work today.

## 2016-08-22 LAB — C-REACTIVE PROTEIN: CRP: 2.5 mg/L (ref <8.0)

## 2016-10-18 DIAGNOSIS — M722 Plantar fascial fibromatosis: Secondary | ICD-10-CM | POA: Diagnosis not present

## 2016-10-18 DIAGNOSIS — M79671 Pain in right foot: Secondary | ICD-10-CM | POA: Diagnosis not present

## 2016-11-05 DIAGNOSIS — M79671 Pain in right foot: Secondary | ICD-10-CM | POA: Diagnosis not present

## 2016-11-05 DIAGNOSIS — M722 Plantar fascial fibromatosis: Secondary | ICD-10-CM | POA: Diagnosis not present

## 2016-11-30 ENCOUNTER — Encounter (INDEPENDENT_AMBULATORY_CARE_PROVIDER_SITE_OTHER): Payer: Self-pay | Admitting: *Deleted

## 2016-11-30 DIAGNOSIS — I8500 Esophageal varices without bleeding: Secondary | ICD-10-CM

## 2016-11-30 HISTORY — DX: Esophageal varices without bleeding: I85.00

## 2016-12-05 ENCOUNTER — Telehealth (INDEPENDENT_AMBULATORY_CARE_PROVIDER_SITE_OTHER): Payer: Self-pay | Admitting: *Deleted

## 2016-12-05 ENCOUNTER — Encounter (INDEPENDENT_AMBULATORY_CARE_PROVIDER_SITE_OTHER): Payer: Self-pay | Admitting: *Deleted

## 2016-12-05 NOTE — Telephone Encounter (Signed)
Referring MD/PCP: fisher   Procedure: egd  Reason/Indication:  cirrhosis  Has patient had this procedure before?  Yes, 12/2015  If so, when, by whom and where?    Is there a family history of colon cancer?    Who?  What age when diagnosed?    Is patient diabetic?   no      Does patient have prosthetic heart valve or mechanical valve?  no  Do you have a pacemaker?  no  Has patient ever had endocarditis? no  Has patient had joint replacement within last 12 months?  no  Does patient tend to be constipated or take laxatives? no  Does patient have a history of alcohol/drug use?  no  Is patient on Coumadin, Plavix and/or Aspirin? no  Medications: see epic  Allergies: see epic  Medication Adjustment per Dr Laural Golden:   Procedure date & time: 12/17/16 at 210

## 2016-12-06 ENCOUNTER — Ambulatory Visit (INDEPENDENT_AMBULATORY_CARE_PROVIDER_SITE_OTHER): Payer: PPO | Admitting: Orthopaedic Surgery

## 2016-12-06 ENCOUNTER — Other Ambulatory Visit (INDEPENDENT_AMBULATORY_CARE_PROVIDER_SITE_OTHER): Payer: Self-pay | Admitting: *Deleted

## 2016-12-06 ENCOUNTER — Encounter (INDEPENDENT_AMBULATORY_CARE_PROVIDER_SITE_OTHER): Payer: Self-pay | Admitting: Orthopaedic Surgery

## 2016-12-06 ENCOUNTER — Ambulatory Visit (INDEPENDENT_AMBULATORY_CARE_PROVIDER_SITE_OTHER): Payer: PPO

## 2016-12-06 DIAGNOSIS — K7469 Other cirrhosis of liver: Secondary | ICD-10-CM

## 2016-12-06 DIAGNOSIS — M19132 Post-traumatic osteoarthritis, left wrist: Secondary | ICD-10-CM

## 2016-12-06 MED ORDER — LIDOCAINE HCL 1 % IJ SOLN
1.0000 mL | INTRAMUSCULAR | Status: AC | PRN
  Administered 2016-12-06: 1 mL

## 2016-12-06 MED ORDER — BUPIVACAINE HCL 0.5 % IJ SOLN
1.0000 mL | INTRAMUSCULAR | Status: AC | PRN
  Administered 2016-12-06: 1 mL via INTRA_ARTICULAR

## 2016-12-06 MED ORDER — METHYLPREDNISOLONE ACETATE 40 MG/ML IJ SUSP
40.0000 mg | INTRAMUSCULAR | Status: AC | PRN
  Administered 2016-12-06: 40 mg via INTRA_ARTICULAR

## 2016-12-06 NOTE — Telephone Encounter (Signed)
agree

## 2016-12-06 NOTE — Progress Notes (Addendum)
Office Visit Note   Patient: Stephen Burch           Date of Birth: Jul 06, 1955           MRN: [id number] Visit Date: 12/06/2016              Requested by: Malva Limes, MD [address] [place], [place] [address] PCP: Malva Limes, MD   Assessment & Plan: Visit Diagnoses:  1. Slac (scapholunate advanced collapse) of wrist, left     Plan: Overall impression is flareup of his left slac wrist.  We performed a wrist aspiration and injection.  I discussed the condition with him and I showed him the x-rays. He understands. Follow-up with me as needed.  Consider MRI of wrist if not better.  Wrist brace and oow note given today  Follow-Up Instructions: Return if symptoms worsen or fail to improve.   Orders:  Orders Placed This Encounter  Procedures  . XR Wrist Complete Left   No orders of the defined types were placed in this encounter.     Procedures: Medium Joint Inj Date/Time: 12/06/2016 8:36 AM Performed by: Tarry Kos Authorized by: Tarry Kos   Consent Given by:  Patient Timeout: prior to procedure the correct patient, procedure, and site was verified   Indications:  Pain Location:  Wrist Site:  L radiocarpal Prep: patient was prepped and draped in usual sterile fashion   Needle Size:  25 G Approach:  Dorsal Ultrasound Guided: No   Fluoroscopic Guidance: No   Medications:  1 mL lidocaine 1 %; 1 mL bupivacaine 0.5 %; 40 mg methylPREDNISolone acetate 40 MG/ML Aspiration Attempted: Yes       Clinical Data: No additional findings.   Subjective: Chief Complaint  Patient presents with  . Left Wrist - Pain    Patient comes in with left wrist swelling recently with exertion. He states that he was doing a lot with a chainsaw and his left wrist swelled up. He has had an injection in the past without any real relief. He states that it does flareup occasionally. He is left-handed. He denies a history of gout. He does endorse a remote history of  a wrist injury.      Review of Systems  Constitutional: Negative.   All other systems reviewed and are negative.    Objective: Vital Signs: There were no vitals taken for this visit.  Physical Exam  Constitutional: He is oriented to person, place, and time. He appears well-developed and well-nourished.  HENT:  Head: Normocephalic and atraumatic.  Eyes: Pupils are equal, round, and reactive to light.  Neck: Neck supple.  Pulmonary/Chest: Effort normal.  Abdominal: Soft.  Musculoskeletal: Normal range of motion.  Neurological: He is alert and oriented to person, place, and time.  Skin: Skin is warm.  Psychiatric: He has a normal mood and affect. His behavior is normal. Judgment and thought content normal.  Nursing note and vitals reviewed.   Ortho Exam Left wrist exam shows some mild swelling of the left wrist. The joint is boggy to palpation. There is slight warmth but no cellulitis or signs of infection. He does have pain with radial deviation and wrist extension. Specialty Comments:  No specialty comments available.  Imaging: No results found.   PMFS History: Patient Active Problem List   Diagnosis Date Noted  . NAFLD (nonalcoholic fatty liver disease) 19/14/7829  . Unsteadiness on feet   . Staphylococcus aureus bacteremia with sepsis ([facility])   .  Infection of prosthetic right knee joint ([facility])   . Knee pain, acute   . Pyogenic arthritis of right knee joint ([facility]) 04/02/2016  . Thrombocytopenia ([facility])   . Hypomagnesemia   . Cardiac arrest ([facility]) 02/22/2016  . Encounter for central line placement   . Shock circulatory ([facility])   . Vitiligo 08/22/2015  . AD (atopic dermatitis) 08/22/2015  . Osteoarthritis of right knee 08/22/2015  . Cirrhosis of liver without ascites ([facility]) 08/22/2015  . Hyperglycemia 08/22/2015  . GERD (gastroesophageal reflux disease) 08/22/2015  . Ganglion of left wrist 08/22/2015  . ED (erectile dysfunction) of organic origin 08/22/2015  . Asthma  08/22/2015  . Dysplastic nodule of liver 08/22/2015  . Baker cyst 09/06/2014  . H/O total knee replacement 08/05/2014  . Bilateral inguinal hernia 08/01/2012  . Splenomegaly 08/01/2012  . Portal hypertension ([facility]) 08/01/2012  . Gallstones 08/01/2012   Past Medical History:  Diagnosis Date  . Arthritis    knees  . Asthma    as a kid  . Cirrhosis ([facility])   . GERD (gastroesophageal reflux disease)   . History of hiatal hernia   . Hx of cardiac arrest   . Kidney stones   . Liver cirrhosis ([facility])   . NAFLD (nonalcoholic fatty liver disease) 03/09/1190  . Pneumonia    3 years ago  . Thrombocytopenia ([facility])     Family History  Problem Relation Age of Onset  . Diabetes Mother        type 2, born 37  . Arthritis Father        died age 4  . GI Bleed Father        upper GI Bleed, non-ETOH cirrhosis  . Arthritis Brother   . Diabetes Brother        type 2  . Diabetes Paternal Uncle        type 2  . Heart attack Maternal Grandmother   . Heart attack Maternal Grandfather   . Colon cancer Neg Hx   . Colon polyps Neg Hx     Past Surgical History:  Procedure Laterality Date  . APPLICATION OF WOUND VAC Right 02/22/2016   Procedure: APPLICATION OF WOUND VAC;  Surgeon: Dannielle Huh, MD;  Location: [facility] OR;  Service: Orthopedics;  Laterality: Right;  . CARDIAC CATHETERIZATION N/A 02/24/2016   Procedure: Left Heart Cath and Coronary Angiography;  Surgeon: Kathleene Hazel, MD;  Location: [facility] INVASIVE CV LAB;  Service: Cardiovascular;  Laterality: N/A;  . COLONOSCOPY    . CT SCAN of Abdomen  06/27/2012   Cirrhosis with portal hypertension. Marked slenomegaly. Bilateral inguinal hernias. Right inguinal hernia contains part of the bladder. Report from [facility]  . EAR CYST EXCISION Right 09/06/2014   Procedure: OPEN EXCISION BAKER'S CYST RIGHT KNEE;  Surgeon: Dannielle Huh, MD;  Location: [facility] OR;  Service: Orthopedics;  Laterality: Right;  . ESOPHAGEAL DILATION  1996  .  ESOPHAGOGASTRODUODENOSCOPY N/A 09/17/2012   Procedure: ESOPHAGOGASTRODUODENOSCOPY (EGD);  Surgeon: Malissa Hippo, MD;  Location: [facility] ENDO SUITE;  Service: Endoscopy;  Laterality: N/A;  200  . ESOPHAGOGASTRODUODENOSCOPY N/A 12/15/2015   Procedure: ESOPHAGOGASTRODUODENOSCOPY (EGD);  Surgeon: Malissa Hippo, MD;  Location: [facility] ENDO SUITE;  Service: Endoscopy;  Laterality: N/A;  1245  . Exercise tredmill test  03/27/2005   Normal  . HERNIA REPAIR Bilateral 05/22/2013, 11/1997   x 2 (Inguinal hernia) Dr. Derrell Lolling, Dr. Achilles Dunk  . I&D KNEE WITH POLY EXCHANGE Right 04/02/2016   Procedure: IRRIGATION AND DEBRIDEMENT RIGHT KNEE  WITH POLY EXCHANGE;  Surgeon: Dannielle Huh, MD;  Location: [facility] OR;  Service: Orthopedics;  Laterality: Right;  . INGUINAL HERNIA REPAIR Bilateral 05/22/2013   Procedure: REPAIR OF RECURRENT INCARCERATED INGUINAL HERNIA WITH MESH RIGHT SIDE,  REPAIR OF RECURRENT INGUINAL HERNIA WITH MESH LEFT SIDE;  Surgeon: Ernestene Mention, MD;  Location: [facility] OR;  Service: General;  Laterality: Bilateral;  . INSERTION OF MESH Bilateral 05/22/2013   Procedure: INSERTION OF MESH;  Surgeon: Ernestene Mention, MD;  Location: [facility] OR;  Service: General;  Laterality: Bilateral;  . IRRIGATION AND DEBRIDEMENT KNEE Right 02/22/2016   Procedure: IRRIGATION AND DEBRIDEMENT KNEE;  Surgeon: Dannielle Huh, MD;  Location: [facility] OR;  Service: Orthopedics;  Laterality: Right;  . Irrigation and Debridement right knee  03/23/2009   Dr. Hyacinth Meeker, [facility]  . JOINT REPLACEMENT  03/16/2009   RIGHT KNEE REPLACEMENT  . KNEE ARTHROSCOPY WITH EXCISION BAKER'S CYST Right 10/01/14  . KNEE SURGERY    . Radionuclide Imaging Study     Normal LV systolic function with ejection of 50%; Normal myocardial perfusion without evidence of myocardial ischemia  . REPLACEMENT TOTAL KNEE     Bilateral knee replacement, 2010, 2012  . TONSILLECTOMY     Social History   Occupational History  . Tour manager   Social History Main Topics  .  Smoking status: Never Smoker  . Smokeless tobacco: Never Used  . Alcohol use No  . Drug use: No  . Sexual activity: Not on file

## 2016-12-17 ENCOUNTER — Encounter (HOSPITAL_COMMUNITY): Payer: Self-pay | Admitting: *Deleted

## 2016-12-17 ENCOUNTER — Ambulatory Visit (HOSPITAL_COMMUNITY)
Admission: RE | Admit: 2016-12-17 | Discharge: 2016-12-17 | Disposition: A | Discharge status: Home / Self Care | Payer: PPO | Source: Ambulatory Visit | Attending: Internal Medicine | Admitting: Internal Medicine

## 2016-12-17 ENCOUNTER — Other Ambulatory Visit (INDEPENDENT_AMBULATORY_CARE_PROVIDER_SITE_OTHER): Payer: Self-pay | Admitting: Internal Medicine

## 2016-12-17 ENCOUNTER — Encounter (HOSPITAL_COMMUNITY)
Admission: RE | Disposition: A | Discharge status: Home / Self Care | Payer: Self-pay | Source: Ambulatory Visit | Attending: Internal Medicine

## 2016-12-17 DIAGNOSIS — I8500 Esophageal varices without bleeding: Secondary | ICD-10-CM | POA: Insufficient documentation

## 2016-12-17 DIAGNOSIS — Z880 Allergy status to penicillin: Secondary | ICD-10-CM | POA: Insufficient documentation

## 2016-12-17 DIAGNOSIS — K228 Other specified diseases of esophagus: Secondary | ICD-10-CM | POA: Diagnosis not present

## 2016-12-17 DIAGNOSIS — K2270 Barrett's esophagus without dysplasia: Secondary | ICD-10-CM | POA: Insufficient documentation

## 2016-12-17 DIAGNOSIS — K3189 Other diseases of stomach and duodenum: Secondary | ICD-10-CM | POA: Diagnosis not present

## 2016-12-17 DIAGNOSIS — Z8674 Personal history of sudden cardiac arrest: Secondary | ICD-10-CM | POA: Diagnosis not present

## 2016-12-17 DIAGNOSIS — K7469 Other cirrhosis of liver: Secondary | ICD-10-CM | POA: Diagnosis not present

## 2016-12-17 DIAGNOSIS — K219 Gastro-esophageal reflux disease without esophagitis: Secondary | ICD-10-CM | POA: Insufficient documentation

## 2016-12-17 DIAGNOSIS — Z881 Allergy status to other antibiotic agents status: Secondary | ICD-10-CM | POA: Diagnosis not present

## 2016-12-17 DIAGNOSIS — Z87442 Personal history of urinary calculi: Secondary | ICD-10-CM | POA: Insufficient documentation

## 2016-12-17 DIAGNOSIS — J45909 Unspecified asthma, uncomplicated: Secondary | ICD-10-CM | POA: Diagnosis not present

## 2016-12-17 DIAGNOSIS — K766 Portal hypertension: Secondary | ICD-10-CM | POA: Diagnosis not present

## 2016-12-17 DIAGNOSIS — K760 Fatty (change of) liver, not elsewhere classified: Secondary | ICD-10-CM | POA: Diagnosis not present

## 2016-12-17 DIAGNOSIS — D696 Thrombocytopenia, unspecified: Secondary | ICD-10-CM | POA: Diagnosis not present

## 2016-12-17 DIAGNOSIS — Z79899 Other long term (current) drug therapy: Secondary | ICD-10-CM | POA: Insufficient documentation

## 2016-12-17 DIAGNOSIS — Z886 Allergy status to analgesic agent status: Secondary | ICD-10-CM | POA: Diagnosis not present

## 2016-12-17 DIAGNOSIS — M1990 Unspecified osteoarthritis, unspecified site: Secondary | ICD-10-CM | POA: Insufficient documentation

## 2016-12-17 DIAGNOSIS — K7460 Unspecified cirrhosis of liver: Secondary | ICD-10-CM | POA: Insufficient documentation

## 2016-12-17 HISTORY — PX: ESOPHAGOGASTRODUODENOSCOPY: SHX5428

## 2016-12-17 SURGERY — EGD (ESOPHAGOGASTRODUODENOSCOPY)
Anesthesia: Moderate Sedation

## 2016-12-17 MED ORDER — MIDAZOLAM HCL 5 MG/5ML IJ SOLN
INTRAMUSCULAR | Status: AC
  Filled 2016-12-17: qty 10

## 2016-12-17 MED ORDER — MIDAZOLAM HCL 5 MG/5ML IJ SOLN
INTRAMUSCULAR | Status: DC | PRN
  Administered 2016-12-17: 2 mg via INTRAVENOUS
  Administered 2016-12-17: 1 mg via INTRAVENOUS
  Administered 2016-12-17: 2 mg via INTRAVENOUS

## 2016-12-17 MED ORDER — LIDOCAINE VISCOUS 2 % MT SOLN
OROMUCOSAL | Status: DC | PRN
  Administered 2016-12-17: 1 via OROMUCOSAL

## 2016-12-17 MED ORDER — MEPERIDINE HCL 50 MG/ML IJ SOLN
INTRAMUSCULAR | Status: AC
  Filled 2016-12-17: qty 1

## 2016-12-17 MED ORDER — LIDOCAINE VISCOUS 2 % MT SOLN
OROMUCOSAL | Status: AC
  Filled 2016-12-17: qty 15

## 2016-12-17 MED ORDER — SIMETHICONE 40 MG/0.6ML PO SUSP
ORAL | Status: DC | PRN
  Administered 2016-12-17: 100 mL

## 2016-12-17 MED ORDER — SODIUM CHLORIDE 0.9 % IV SOLN
INTRAVENOUS | Status: DC
  Administered 2016-12-17: 1000 mL via INTRAVENOUS

## 2016-12-17 MED ORDER — PANTOPRAZOLE SODIUM 40 MG PO TBEC: 40.0000 mg | DELAYED_RELEASE_TABLET | Freq: Every day | ORAL | 3 refills | Status: DC

## 2016-12-17 MED ORDER — MEPERIDINE HCL 50 MG/ML IJ SOLN
INTRAMUSCULAR | Status: DC | PRN
  Administered 2016-12-17: 25 mg via INTRAVENOUS

## 2016-12-17 NOTE — Discharge Instructions (Signed)
Resume usual medications and diet. No driving for 24 hours. Office visit in December 2018.   Upper Endoscopy, Care After Refer to this sheet in the next few weeks. These instructions provide you with information about caring for yourself after your procedure. Your health care provider may also give you more specific instructions. Your treatment has been planned according to current medical practices, but problems sometimes occur. Call your health care provider if you have any problems or questions after your procedure. What can I expect after the procedure? After the procedure, it is common to have:  A sore throat.  Bloating.  Nausea.  Follow these instructions at home:  Follow instructions from your health care provider about what to eat or drink after your procedure.  Return to your normal activities as told by your health care provider. Ask your health care provider what activities are safe for you.  Take over-the-counter and prescription medicines only as told by your health care provider.  Do not drive for 24 hours if you received a sedative.  Keep all follow-up visits as told by your health care provider. This is important. Contact a health care provider if:  You have a sore throat that lasts longer than one day.  You have trouble swallowing. Get help right away if:  You have a fever.  You vomit blood or your vomit looks like coffee grounds.  You have bloody, black, or tarry stools.  You have a severe sore throat or you cannot swallow.  You have difficulty breathing.  You have severe pain in your chest or belly. This information is not intended to replace advice given to you by your health care provider. Make sure you discuss any questions you have with your health care provider. Document Released: 12/18/2011 Document Revised: 11/24/2015 Document Reviewed: 03/31/2015 Elsevier Interactive Patient Education  2017 [employer].

## 2016-12-17 NOTE — Op Note (Signed)
[facility] Patient Name: Stephen Burch Procedure Date: 12/17/2016 3:42 PM MRN: [id number] Date of Birth: [birth year] Attending MD: Lionel December , MD CSN: [id number] Age: 61 Admit Type: Outpatient Procedure:                Upper GI endoscopy Indications:              Follow-up of esophageal varices Providers:                Lionel December, MD, Edrick Kins, RN, Burke Keels, Technician Referring MD:             Demetrios Isaacs. Fisher, MD Medicines:                Lidocaine spray, Meperidine 50 mg IV, Midazolam 5                            mg IV Complications:            No immediate complications. Estimated Blood Loss:     Estimated blood loss: none                           . Procedure:                Pre-Anesthesia Assessment:                           - Prior to the procedure, a History and Physical                            was performed, and patient medications and                            allergies were reviewed. The patient's tolerance of                            previous anesthesia was also reviewed. The risks                            and benefits of the procedure and the sedation                            options and risks were discussed with the patient.                            All questions were answered, and informed consent                            was obtained. Prior Anticoagulants: The patient has                            taken no previous anticoagulant or antiplatelet                            agents. ASA Grade Assessment:  II - A patient with                            mild systemic disease. After reviewing the risks                            and benefits, the patient was deemed in                            satisfactory condition to undergo the procedure.                           After obtaining informed consent, the endoscope was                            passed under direct vision. Throughout the                             procedure, the patient's blood pressure, pulse, and                            oxygen saturations were monitored continuously. The                            [id number] ([id number]) scope was introduced through the                            mouth, and advanced to the second part of duodenum.                            The upper GI endoscopy was accomplished without                            difficulty. The patient tolerated the procedure                            well. Scope In: 3:59:07 PM Scope Out: 4:06:29 PM Total Procedure Duration: 0 hours 7 minutes 22 seconds  Findings:      The proximal esophagus and mid esophagus were normal.      Grade I varices were found in the distal esophagus.      There were esophageal mucosal changes secondary to established       short-segment Barrett's disease present in the distal esophagus. The       maximum longitudinal extent of these mucosal changes was 1 cm in length.      The Z-line was irregular and was found 40 cm from the incisors.      Mild portal hypertensive gastropathy was found in the gastric fundus and       in the gastric body.      The exam of the stomach was otherwise normal.      The duodenal bulb and second portion of the duodenum were normal. Impression:               - Normal proximal esophagus and mid esophagus.                           -  Grade I esophageal varices.                           - Esophageal mucosal changes secondary to                            established short-segment Barrett's disease. Two                            patches no more than 10 mm in lenght. Biopsy not                            taken.                           - Z-line irregular, 40 cm from the incisors.                           - Portal hypertensive gastropathy.                           - Normal duodenal bulb and second portion of the                            duodenum.                           - No specimens collected. Moderate Sedation:       Moderate (conscious) sedation was administered by the endoscopy nurse       and supervised by the endoscopist. The following parameters were       monitored: oxygen saturation, heart rate, blood pressure, CO2       capnography and response to care. Total physician intraservice time was       11 minutes. Recommendation:           - Patient has a contact number available for                            emergencies. The signs and symptoms of potential                            delayed complications were discussed with the                            patient. Return to normal activities tomorrow.                            Written discharge instructions were provided to the                            patient.                           - Resume previous diet today.                           - Continue  present medications.                           - Repeat upper endoscopy in 1 year.                           - Return to GI clinic in 6 months. Procedure Code(s):        --- Professional ---                           [phone], Esophagogastroduodenoscopy, flexible,                            transoral; diagnostic, including collection of                            specimen(s) by brushing or washing, when performed                            (separate procedure)                           99152, Moderate sedation services provided by the                            same physician or other qualified health care                            professional performing the diagnostic or                            therapeutic service that the sedation supports,                            requiring the presence of an independent trained                            observer to assist in the monitoring of the                            patient's level of consciousness and physiological                            status; initial 15 minutes of intraservice time,                            patient age 25 years or  older Diagnosis Code(s):        --- Professional ---                           K22.70, Barrett's esophagus without dysplasia                           I85.00, Esophageal varices without bleeding  K22.8, Other specified diseases of esophagus                           K76.6, Portal hypertension                           K31.89, Other diseases of stomach and duodenum CPT copyright 2016 American Medical Association. All rights reserved. The codes documented in this report are preliminary and upon coder review may  be revised to meet current compliance requirements. Lionel December, MD Lionel December, MD 12/17/2016 4:29:29 PM This report has been signed electronically. Number of Addenda: 0

## 2016-12-17 NOTE — H&P (Signed)
Stephen Burch is an 61 y.o. male.   Chief Complaint: Patient is here for EGD and possible esophageal variceal banding. HPI: Patient is 61 year old Caucasian male who is cirrhosis secondary to NASH On EGD last year and was noted to have small varices. He is returning for repeat evaluation therapy if varices have increased in size. He also has short segment Barrett's esophagus. Heartburn is well controlled with therapy. He denies nausea vomiting dysphagia or abdominal pain melena or rectal bleeding.  Past Medical History:  Diagnosis Date  . Arthritis    knees  . Asthma    as a kid  . Cirrhosis ([place])   . GERD (gastroesophageal reflux disease)   . History of hiatal hernia   . Hx of cardiac arrest   . Kidney stones   . Liver cirrhosis ([place])   . NAFLD (nonalcoholic fatty liver disease) 04/04/2016  . Pneumonia    3 years ago  . Thrombocytopenia ([place])     Past Surgical History:  Procedure Laterality Date  . APPLICATION OF WOUND VAC Right 02/22/2016   Procedure: APPLICATION OF WOUND VAC;  Surgeon: Vickey Huger, MD;  Location: [place];  Service: Orthopedics;  Laterality: Right;  . CARDIAC CATHETERIZATION N/A 02/24/2016   Procedure: Left Heart Cath and Coronary Angiography;  Surgeon: Burnell Blanks, MD;  Location: [place] [facility];  Service: Cardiovascular;  Laterality: N/A;  . COLONOSCOPY    . CT SCAN of Abdomen  06/27/2012   Cirrhosis with portal hypertension. Marked slenomegaly. Bilateral inguinal hernias. Right inguinal hernia contains part of the bladder. Report from [facility] Right 09/06/2014   Procedure: OPEN EXCISION BAKER'S CYST RIGHT KNEE;  Surgeon: Vickey Huger, MD;  Location: [place];  Service: Orthopedics;  Laterality: Right;  . [place]  . ESOPHAGOGASTRODUODENOSCOPY N/A 09/17/2012   Procedure: ESOPHAGOGASTRODUODENOSCOPY (EGD);  Surgeon: Rogene Houston, MD;  [facility];  Service: Endoscopy;  Laterality: N/A;  200  .  ESOPHAGOGASTRODUODENOSCOPY N/A 12/15/2015   Procedure: ESOPHAGOGASTRODUODENOSCOPY (EGD);  Surgeon: Rogene Houston, MD;  [facility];  Service: Endoscopy;  Laterality: N/A;  1245  . Exercise tredmill test  03/27/2005   Normal  . HERNIA REPAIR Bilateral 05/22/2013, 11/1997   x 2 (Inguinal hernia) Dr. Dalbert Batman, Dr. Jacqlyn Larsen  . I&D KNEE WITH POLY EXCHANGE Right 04/02/2016   Procedure: IRRIGATION AND DEBRIDEMENT RIGHT KNEE WITH POLY EXCHANGE;  Surgeon: Vickey Huger, MD;  Location: [place] [facility];  Service: Orthopedics;  Laterality: Right;  . INGUINAL HERNIA REPAIR Bilateral 05/22/2013   Procedure: REPAIR OF RECURRENT INCARCERATED INGUINAL HERNIA WITH MESH RIGHT SIDE,  REPAIR OF RECURRENT INGUINAL HERNIA WITH MESH LEFT SIDE;  Surgeon: [name], MD;  Location: [place];  Service: General;  Laterality: Bilateral;  . INSERTION OF MESH Bilateral 05/22/2013   Procedure: INSERTION OF MESH;  Surgeon: [name], MD;  Location: [place];  Service: General;  Laterality: Bilateral;  . IRRIGATION AND DEBRIDEMENT KNEE Right 02/22/2016   Procedure: IRRIGATION AND DEBRIDEMENT KNEE;  Surgeon: Vickey Huger, MD;  Location: [place];  Service: Orthopedics;  Laterality: Right;  . Irrigation and Debridement right knee  03/23/2009   Dr. Sabra Heck, [facility]  . JOINT REPLACEMENT  03/16/2009   RIGHT KNEE REPLACEMENT  . KNEE ARTHROSCOPY WITH EXCISION BAKER'S CYST Right 10/01/14  . KNEE SURGERY    . Radionuclide Imaging Study     Normal LV systolic function with ejection of 50%; Normal myocardial perfusion without  evidence of myocardial ischemia  . REPLACEMENT TOTAL KNEE     Bilateral knee replacement, 2010, 2012  . TONSILLECTOMY      Family History  Problem Relation Age of Onset  . Diabetes Mother        type 2, born 43  . Arthritis Father        died age 39  . GI Bleed Father        upper GI Bleed, non-ETOH cirrhosis  . Arthritis Brother   . Diabetes Brother        type 2  . Diabetes Paternal Uncle        type 2   . Heart attack Maternal Grandmother   . Heart attack Maternal Grandfather   . Colon cancer Neg Hx   . Colon polyps Neg Hx    Social History:  reports that he has never smoked. He has never used smokeless tobacco. He reports that he does not drink alcohol or use drugs.  Allergies:  Allergies  Allergen Reactions  . Asa [Aspirin] Shortness Of Breath  . Penicillins Shortness Of Breath and Rash    Has patient had a PCN reaction causing immediate rash, facial/tongue/throat swelling, SOB or lightheadedness with hypotension: Yes Has patient had a PCN reaction causing severe rash involving mucus membranes or skin necrosis: No Has patient had a PCN reaction that required hospitalization No Has patient had a PCN reaction occurring within the last 10 years: No If all of the above answers are "NO", then may proceed with Cephalosporin use.   . Vancomycin Anaphylaxis    Immediately following Vancomycin administration in the OR patient Vfib arrested.    Medications Prior to Admission  Medication Sig Dispense Refill  . cephALEXin (KEFLEX) 500 MG capsule Take 1 capsule (500 mg total) by mouth 3 (three) times daily. (Patient taking differently: Take 500 mg by mouth 2 (two) times daily. ) 90 capsule 4  . rifampin (RIFADIN) 300 MG capsule Take 2 capsules (600 mg total) by mouth daily. (Patient taking differently: Take 300 mg by mouth 2 (two) times daily. In the morning & in the afternoon) 60 capsule 4  . pantoprazole (PROTONIX) 40 MG tablet Take 1 tablet (40 mg total) by mouth daily. 30 tablet 11    No results found for this or any previous visit (from the past 48 hour(s)). No results found.  ROS  Blood pressure 120/79, pulse 62, temperature 98.6 F (37 C), temperature source Oral, resp. rate 10, height 6' (1.829 m), weight 215 lb (97.5 kg), SpO2 96 %. Physical Exam  Constitutional: He appears well-developed and well-nourished.  HENT:  Mouth/Throat: Oropharynx is clear and moist.  Eyes:  Conjunctivae are normal. No scleral icterus.  Neck: No thyromegaly present.  Cardiovascular: Normal rate, regular rhythm and normal heart sounds.   No murmur heard. Respiratory: Effort normal and breath sounds normal.  GI: Soft. He exhibits no distension and no mass. There is no tenderness.  Musculoskeletal: He exhibits no edema.  Lymphadenopathy:    He has no cervical adenopathy.  Neurological: He is alert.  Skin: Skin is warm and dry.  Vitiligo involving both hands.     Assessment/Plan Cirrhosis secondary to NASH. Short segment Barrett's esophagus. EGD and possible esophageal variceal banding.  Hildred Laser, MD 12/17/2016, 3:51 PM

## 2016-12-20 ENCOUNTER — Encounter (HOSPITAL_COMMUNITY): Payer: Self-pay | Admitting: Internal Medicine

## 2017-01-24 ENCOUNTER — Encounter: Payer: Self-pay | Admitting: Family Medicine

## 2017-01-24 ENCOUNTER — Ambulatory Visit (INDEPENDENT_AMBULATORY_CARE_PROVIDER_SITE_OTHER): Payer: PPO | Admitting: Family Medicine

## 2017-01-24 VITALS — BP 118/78 | HR 64 | Temp 97.8°F | Resp 16 | Wt 222.4 lb

## 2017-01-24 DIAGNOSIS — R5383 Other fatigue: Secondary | ICD-10-CM

## 2017-01-24 NOTE — Progress Notes (Signed)
Patient: Stephen Burch Male    DOB: 1956-03-19   61 y.o.   MRN: [id number] Visit Date: 01/24/2017  Today's Provider: Mila Merry, MD   Chief Complaint  Patient presents with  . Fatigue   Subjective:    HPI Patient is here today to discuss being extremely fatigue and tired the past 3 weeks. He reports sleeping well at night, but after being up for approximately 2 hours he is ready to go back to sleep. Patient denies fatigue symptoms in the past. He has felt somewhat fatigued for several months, but has been much worse lately.    Working 2-3 days a week in [employer], [employer]. Has had no other new health problems lately, no new medications, no changes in home or work duties.   Wt Readings from Last 3 Encounters:  01/24/17 222 lb 6.4 oz (100.9 kg)  12/17/16 215 lb (97.5 kg)  08/21/16 222 lb (100.7 kg)        Review of Systems  Constitutional: Positive for fatigue.  Respiratory: Negative.   Cardiovascular: Negative.     Social History  Substance Use Topics  . Smoking status: Never Smoker  . Smokeless tobacco: Never Used  . Alcohol use No   Objective:   BP 118/78 (BP Location: Left Arm, Patient Position: Sitting, Cuff Size: Normal)   Pulse 64   Temp 97.8 F (36.6 C) (Oral)   Resp 16   Wt 222 lb 6.4 oz (100.9 kg)   BMI 30.16 kg/m   Physical Exam   General Appearance:    Alert, cooperative, no distress, mildly overweight  Eyes:    PERRL, conjunctiva/corneas clear, EOM's intact       Lungs:     Clear to auscultation bilaterally, respirations unlabored  Heart:    Regular rate and rhythm  Neurologic:   Awake, alert, oriented x 3. No apparent focal neurological           defect.      Epworth Sleepiness Scale:   Sitting and reading: Slight chance of dozing  Watching TV: High chance of dozing  Sitting, inactive in a public place (e.g. a theatre or a meeting): High chance of dozing  As a passenger in a car for an hour without a break: Moderate chance of  dozing  Lying down to rest in the afternoon when circumstances permit: Moderate chance of dozing  Sitting and talking to someone: Would never doze  Sitting quietly after a lunch without alcohol: High chance of dozing  In a car, while stopped for a few minutes in traffic: Slight chance of dozing   Total score: 15       Assessment & Plan:     1. Other fatigue  - Comprehensive metabolic panel - CBC - T4 AND TSH - Testosterone,Free and Total  Advised may need sleep study to evaluate for sleep apnea.

## 2017-01-25 ENCOUNTER — Other Ambulatory Visit: Payer: Self-pay | Admitting: Family Medicine

## 2017-01-25 DIAGNOSIS — R400 Somnolence: Secondary | ICD-10-CM

## 2017-01-25 DIAGNOSIS — R5383 Other fatigue: Secondary | ICD-10-CM

## 2017-01-25 LAB — COMPREHENSIVE METABOLIC PANEL
A/G RATIO: 2.2 (ref 1.2–2.2)
ALBUMIN: 4.6 g/dL (ref 3.6–4.8)
ALK PHOS: 104 IU/L (ref 39–117)
ALT: 27 IU/L (ref 0–44)
AST: 28 IU/L (ref 0–40)
BILIRUBIN TOTAL: 0.9 mg/dL (ref 0.0–1.2)
BUN / CREAT RATIO: 12 (ref 10–24)
BUN: 12 mg/dL (ref 8–27)
CHLORIDE: 103 mmol/L (ref 96–106)
CO2: 25 mmol/L (ref 20–29)
Calcium: 9.1 mg/dL (ref 8.6–10.2)
Creatinine, Ser: 1.01 mg/dL (ref 0.76–1.27)
GFR calc non Af Amer: 79 mL/min/{1.73_m2} (ref >59)
GFR, EST AFRICAN AMERICAN: 92 mL/min/{1.73_m2} (ref >59)
GLOBULIN, TOTAL: 2.1 g/dL (ref 1.5–4.5)
Glucose: 116 mg/dL — ABNORMAL HIGH (ref 65–99)
POTASSIUM: 4.5 mmol/L (ref 3.5–5.2)
SODIUM: 142 mmol/L (ref 134–144)
TOTAL PROTEIN: 6.7 g/dL (ref 6.0–8.5)

## 2017-01-25 LAB — CBC
HEMOGLOBIN: 16 g/dL (ref 13.0–17.7)
Hematocrit: 45.9 % (ref 37.5–51.0)
MCH: 30 pg (ref 26.6–33.0)
MCHC: 34.9 g/dL (ref 31.5–35.7)
MCV: 86 fL (ref 79–97)
Platelets: 82 10*3/uL — CL (ref 150–379)
RBC: 5.34 x10E6/uL (ref 4.14–5.80)
RDW: 14.3 % (ref 12.3–15.4)
WBC: 3.3 10*3/uL — AB (ref 3.4–10.8)

## 2017-01-25 LAB — T4 AND TSH
T4 TOTAL: 7.3 ug/dL (ref 4.5–12.0)
TSH: 3.09 u[IU]/mL (ref 0.450–4.500)

## 2017-01-25 LAB — TESTOSTERONE,FREE AND TOTAL
TESTOSTERONE FREE: 15 pg/mL (ref 6.6–18.1)
Testosterone: 447 ng/dL (ref 264–916)

## 2017-02-27 DIAGNOSIS — L309 Dermatitis, unspecified: Secondary | ICD-10-CM | POA: Diagnosis not present

## 2017-02-27 DIAGNOSIS — L570 Actinic keratosis: Secondary | ICD-10-CM | POA: Diagnosis not present

## 2017-02-27 DIAGNOSIS — H02739 Vitiligo of unspecified eye, unspecified eyelid and periocular area: Secondary | ICD-10-CM | POA: Diagnosis not present

## 2017-02-27 DIAGNOSIS — D1800 Hemangioma unspecified site: Secondary | ICD-10-CM | POA: Diagnosis not present

## 2017-03-04 IMAGING — CR DG KNEE COMPLETE 4+V*R*
4 series · 4 of 4 positions shown · non-contrast
Comparison: None.

CLINICAL DATA: Pain and swelling of the posterior right knee.
Recent removal of Baker's cyst on 02/22/2016.

EXAM:
RIGHT KNEE - COMPLETE 4+ VIEW

[knee ap]
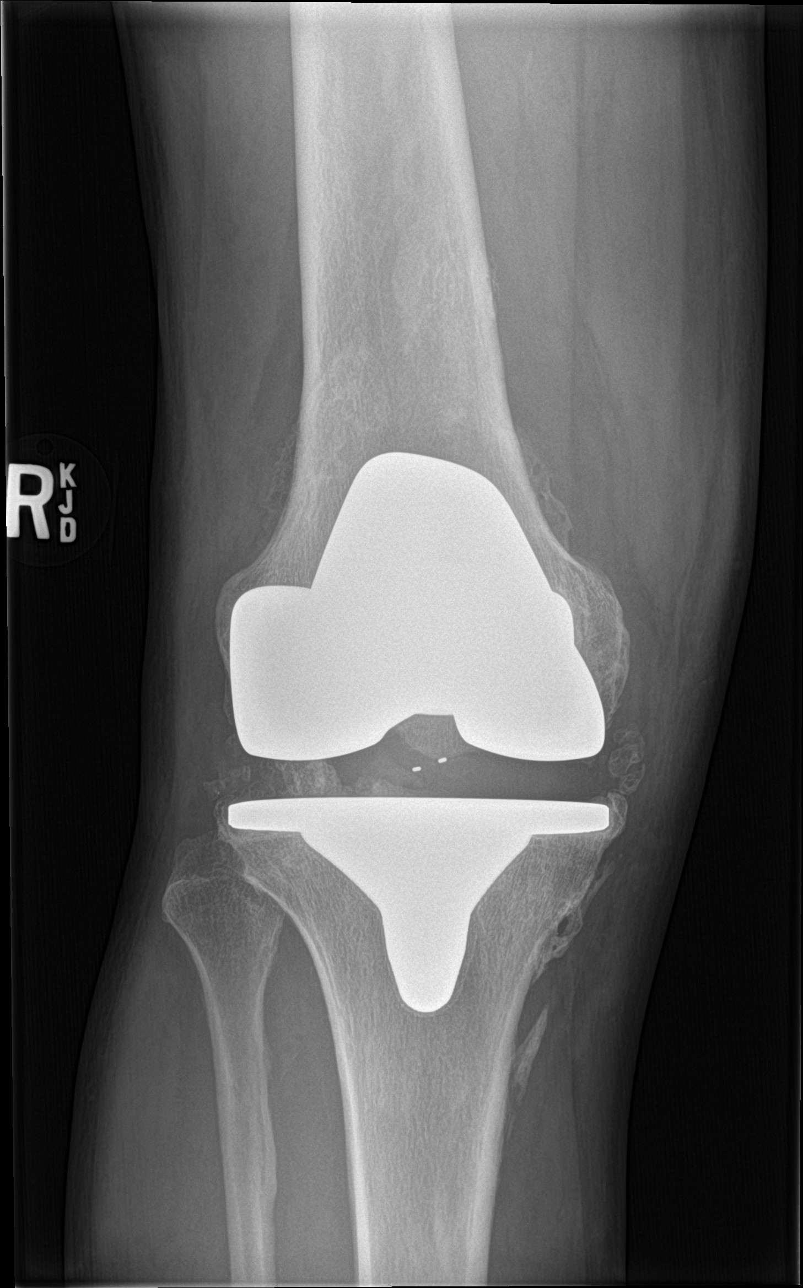

[knee lat]
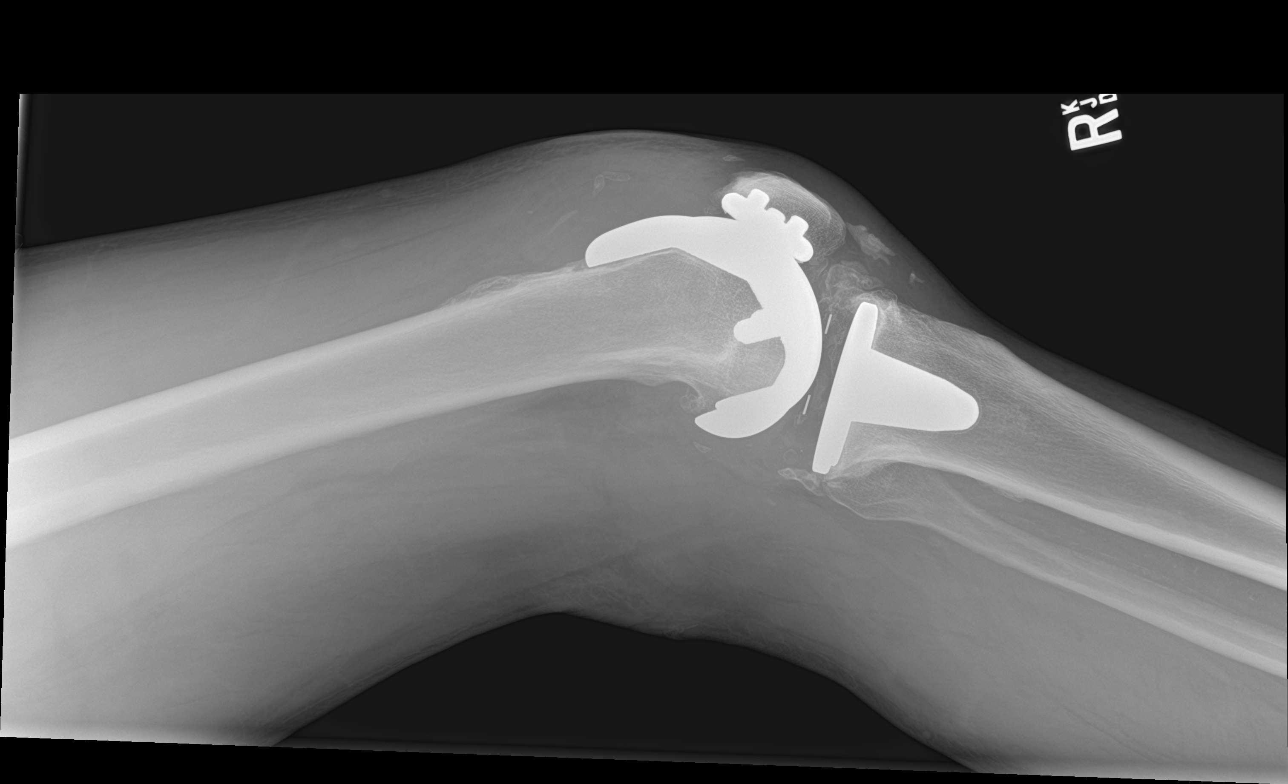

[knee obl (1 of 2)]
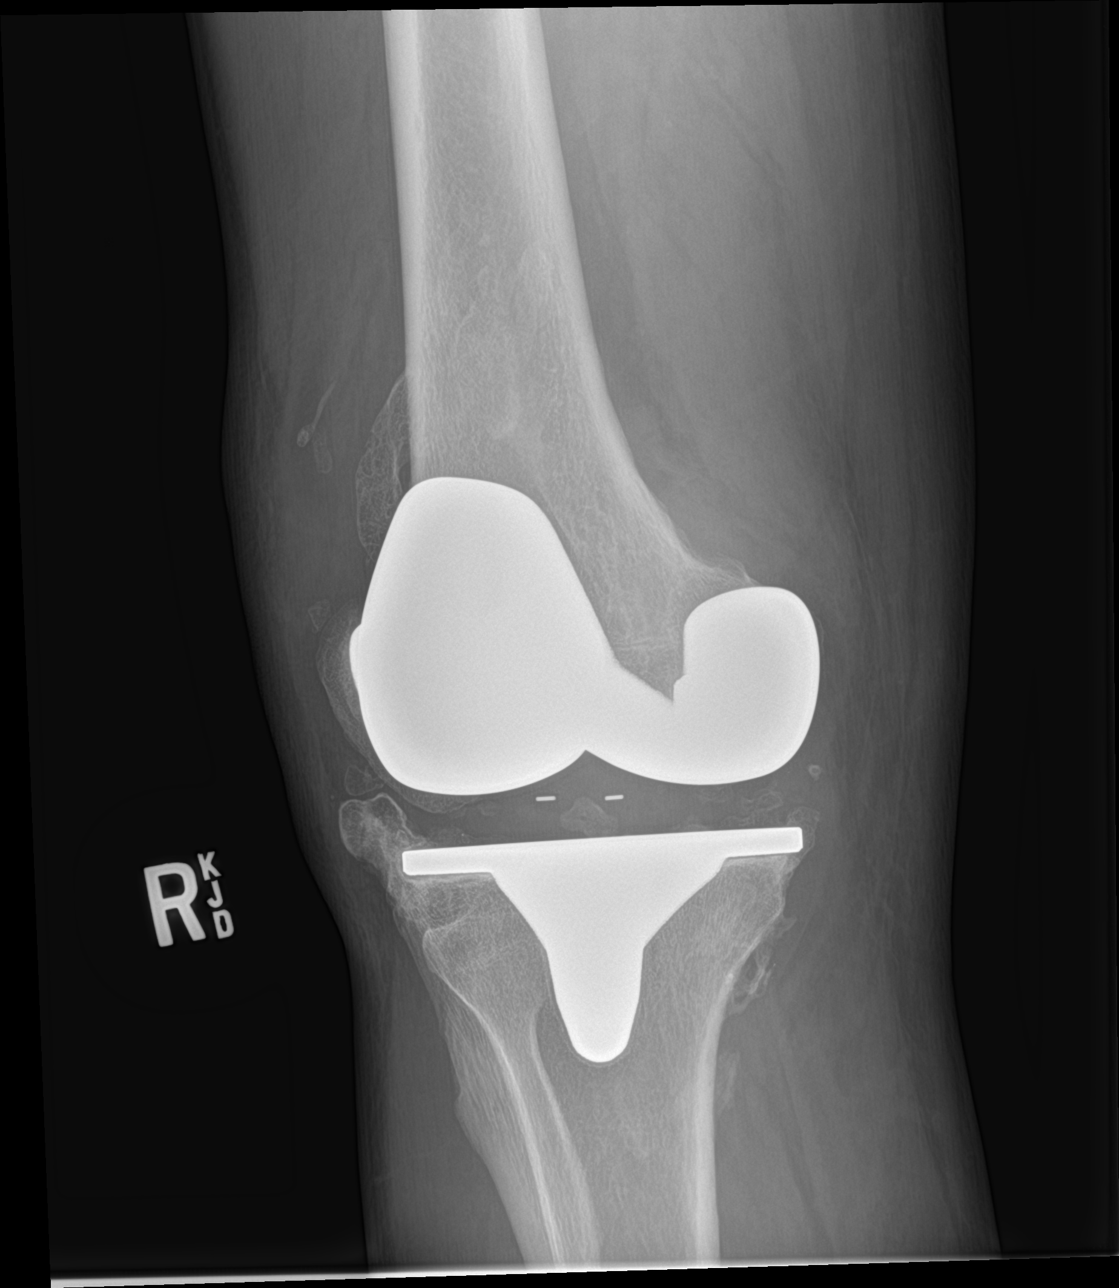

[knee obl (2 of 2)]
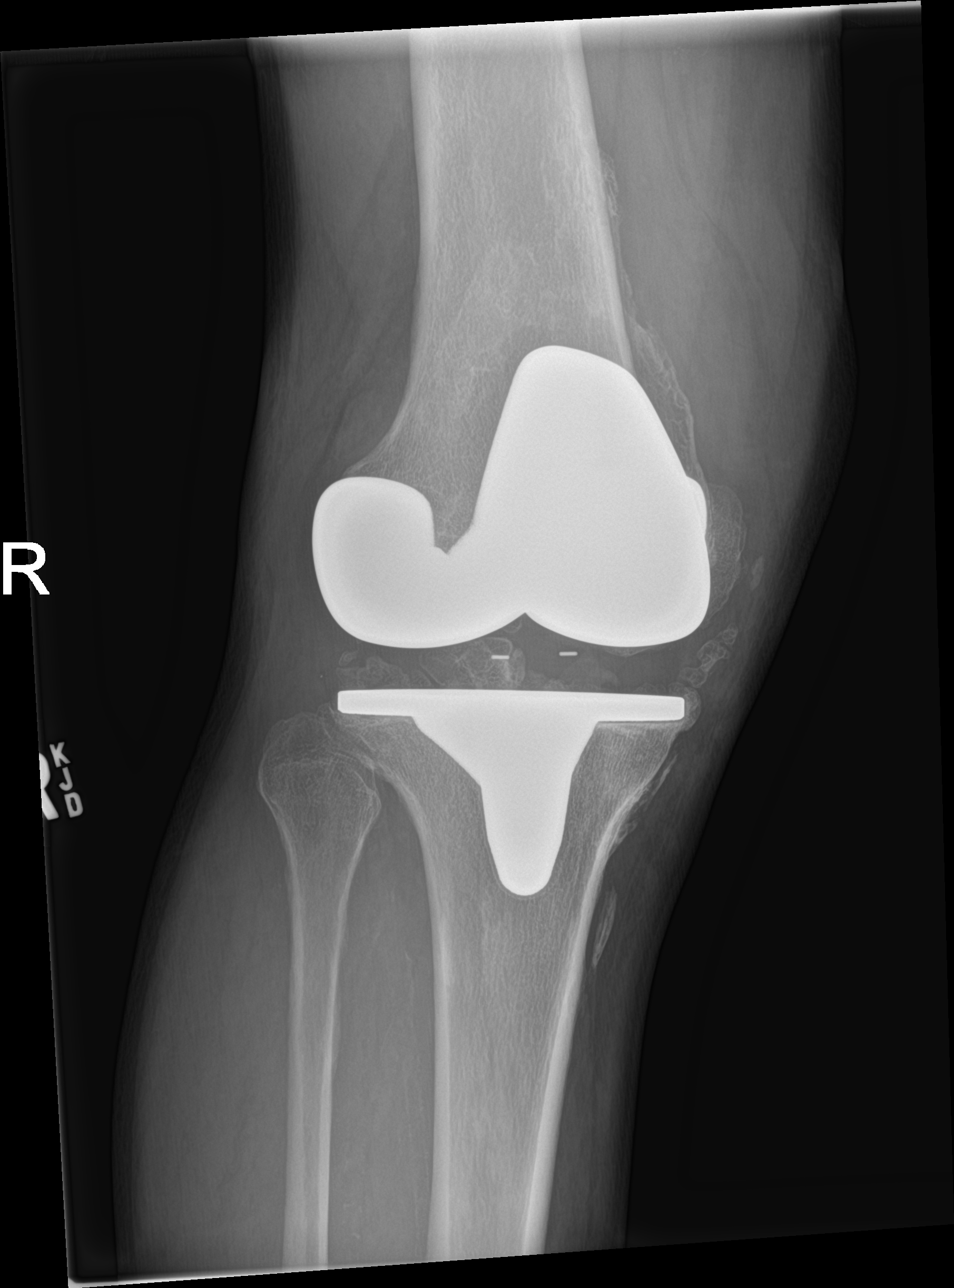

[4 of 4 positions shown; findings below may reference images not displayed]

FINDINGS: Postoperative right total knee arthroplasty with patellar femoral
component. Components appear well seated. Minimal lucency at the
bone hardware interface for the tibial component could indicate mild
loosening. Multiple old appearing ossicles adjacent to the knee are
likely postoperative. Moderate size right knee effusion. Skin
thickening over the suprapatellar region. No acute fracture or
dislocation.
IMPRESSION: Postoperative right knee arthroplasty. Can't exclude mild loosening
of the tibial component. Moderate size right knee effusion with
overlying skin thickening. No acute fracture or dislocation.

## 2017-03-28 ENCOUNTER — Ambulatory Visit: Payer: Self-pay

## 2017-04-17 ENCOUNTER — Ambulatory Visit: Payer: Self-pay

## 2017-04-22 ENCOUNTER — Telehealth: Payer: Self-pay

## 2017-04-22 NOTE — Telephone Encounter (Signed)
LMTCB and r/s cancelled AWV with [facility]. Pt has cancelled last 2 apts. -MM

## 2017-04-30 ENCOUNTER — Encounter (INDEPENDENT_AMBULATORY_CARE_PROVIDER_SITE_OTHER): Payer: Self-pay | Admitting: Orthopaedic Surgery

## 2017-04-30 ENCOUNTER — Ambulatory Visit (INDEPENDENT_AMBULATORY_CARE_PROVIDER_SITE_OTHER): Payer: PPO | Admitting: Orthopaedic Surgery

## 2017-04-30 DIAGNOSIS — M19132 Post-traumatic osteoarthritis, left wrist: Secondary | ICD-10-CM | POA: Diagnosis not present

## 2017-04-30 MED ORDER — LIDOCAINE HCL 1 % IJ SOLN
1.0000 mL | INTRAMUSCULAR | Status: AC | PRN
  Administered 2017-04-30: 1 mL

## 2017-04-30 MED ORDER — DICLOFENAC SODIUM 1 % TD GEL
2.0000 g | Freq: Four times a day (QID) | TRANSDERMAL | 5 refills | Status: DC
Start: 2017-04-30 — End: 2017-10-09

## 2017-04-30 MED ORDER — METHYLPREDNISOLONE ACETATE 40 MG/ML IJ SUSP
40.0000 mg | INTRAMUSCULAR | Status: AC | PRN
  Administered 2017-04-30: 40 mg via INTRA_ARTICULAR

## 2017-04-30 MED ORDER — BUPIVACAINE HCL 0.5 % IJ SOLN
1.0000 mL | INTRAMUSCULAR | Status: AC | PRN
  Administered 2017-04-30: 1 mL via INTRA_ARTICULAR

## 2017-04-30 NOTE — Progress Notes (Signed)
Office Visit Note   Patient: Stephen Burch           Date of Birth: May 29, 1956           MRN: [id number] Visit Date: 04/30/2017              Requested by: Malva Limes, MD [address] [place], [place] [address] PCP: Malva Limes, MD   Assessment & Plan: Visit Diagnoses:  1. Slac (scapholunate advanced collapse) of wrist, left     Plan: Impression is 61 year old gentleman with slac wrist.  Cortisone injection was performed today.  We briefly discussed possible surgical options if injections fail to provide significant relief.  Questions encouraged and answered.  Prescription for Voltaren gel.  Follow-up as needed.  Follow-Up Instructions: Return if symptoms worsen or fail to improve.   Orders:  No orders of the defined types were placed in this encounter.  Meds ordered this encounter  Medications  . diclofenac sodium (VOLTAREN) 1 % GEL    Sig: Apply 2 g topically 4 (four) times daily.    Dispense:  1 Tube    Refill:  5      Procedures: Medium Joint Inj Date/Time: 04/30/2017 10:19 AM Performed by: Tarry Kos Authorized by: Tarry Kos   Location:  Wrist Site:  L radiocarpal Needle Size:  25 G Approach:  Dorsal Ultrasound Guided: No   Fluoroscopic Guidance: No   Medications:  1 mL lidocaine 1 %; 1 mL bupivacaine 0.5 %; 40 mg methylPREDNISolone acetate 40 MG/ML     Clinical Data: No additional findings.   Subjective: Chief Complaint  Patient presents with  . Right Wrist - Pain  . Left Wrist - Pain, Follow-up    Mr. Onder is coming in today for recurrent left wrist pain.  I saw him about 5-6 months ago for slac wrist.  We injected him at that time and he had good relief.  He is requesting another injection today.    Review of Systems   Objective: Vital Signs: There were no vitals taken for this visit.  Physical Exam  Ortho Exam Left wrist shows a small effusion.  Exam is stable Specialty Comments:  No specialty comments  available.  Imaging: No results found.   PMFS History: Patient Active Problem List   Diagnosis Date Noted  . NAFLD (nonalcoholic fatty liver disease) 19/14/7829  . Unsteadiness on feet   . Staphylococcus aureus bacteremia with sepsis (HCC)   . Infection of prosthetic right knee joint (HCC)   . Knee pain, acute   . Pyogenic arthritis of right knee joint (HCC) 04/02/2016  . Thrombocytopenia (HCC)   . Hypomagnesemia   . Cardiac arrest (HCC) 02/22/2016  . Encounter for central line placement   . Shock circulatory (HCC)   . Vitiligo 08/22/2015  . AD (atopic dermatitis) 08/22/2015  . Osteoarthritis of right knee 08/22/2015  . Cirrhosis of liver without ascites (HCC) 08/22/2015  . Hyperglycemia 08/22/2015  . GERD (gastroesophageal reflux disease) 08/22/2015  . Ganglion of left wrist 08/22/2015  . ED (erectile dysfunction) of organic origin 08/22/2015  . Asthma 08/22/2015  . Dysplastic nodule of liver 08/22/2015  . Baker cyst 09/06/2014  . H/O total knee replacement 08/05/2014  . Bilateral inguinal hernia 08/01/2012  . Splenomegaly 08/01/2012  . Portal hypertension (HCC) 08/01/2012  . Gallstones 08/01/2012   Past Medical History:  Diagnosis Date  . Arthritis    knees  . Asthma    as a  kid  . Cirrhosis ([facility])   . GERD (gastroesophageal reflux disease)   . History of hiatal hernia   . Hx of cardiac arrest   . Kidney stones   . Liver cirrhosis ([facility]CC)   . NAFLD (nonalcoholic fatty liver disease) 16/07/958  . Pneumonia    3 years ago  . Thrombocytopenia ([facility])     Family History  Problem Relation Age of Onset  . Diabetes Mother        type 2, born 38  . Arthritis Father        died age 46  . GI Bleed Father        upper GI Bleed, non-ETOH cirrhosis  . Arthritis Brother   . Diabetes Brother        type 2  . Diabetes Paternal Uncle        type 2  . Heart attack Maternal Grandmother   . Heart attack Maternal Grandfather   . Colon cancer Neg Hx   . Colon polyps Neg  Hx     Past Surgical History:  Procedure Laterality Date  . APPLICATION OF WOUND VAC Right 02/22/2016   Procedure: APPLICATION OF WOUND VAC;  Surgeon: Dannielle Huh, MD;  [facility];  Service: Orthopedics;  Laterality: Right;  . CARDIAC CATHETERIZATION N/A 02/24/2016   Procedure: Left Heart Cath and Coronary Angiography;  Surgeon: Kathleene Hazel, MD;  Location: [facility];  Service: Cardiovascular;  Laterality: N/A;  . COLONOSCOPY    . CT SCAN of Abdomen  06/27/2012   Cirrhosis with portal hypertension. Marked slenomegaly. Bilateral inguinal hernias. Right inguinal hernia contains part of the bladder. Report from [facility]  . EAR CYST EXCISION Right 09/06/2014   Procedure: OPEN EXCISION BAKER'S CYST RIGHT KNEE;  Surgeon: Dannielle Huh, MD;  Location: [facility] OR;  Service: Orthopedics;  Laterality: Right;  . ESOPHAGEAL DILATION  1996  . ESOPHAGOGASTRODUODENOSCOPY N/A 09/17/2012   Procedure: ESOPHAGOGASTRODUODENOSCOPY (EGD);  Surgeon: Malissa Hippo, MD;  [facility];  Service: Endoscopy;  Laterality: N/A;  200  . ESOPHAGOGASTRODUODENOSCOPY N/A 12/15/2015   Procedure: ESOPHAGOGASTRODUODENOSCOPY (EGD);  Surgeon: Malissa Hippo, MD;  [facility];  Service: Endoscopy;  Laterality: N/A;  1245  . ESOPHAGOGASTRODUODENOSCOPY N/A 12/17/2016   Procedure: ESOPHAGOGASTRODUODENOSCOPY (EGD);  Surgeon: Malissa Hippo, MD;  [facility];  Service: Endoscopy;  Laterality: N/A;  210  . Exercise tredmill test  03/27/2005   Normal  . HERNIA REPAIR Bilateral 05/22/2013, 11/1997   x 2 (Inguinal hernia) Dr. Derrell Lolling, Dr. Achilles Dunk  . I&D KNEE WITH POLY EXCHANGE Right 04/02/2016   Procedure: IRRIGATION AND DEBRIDEMENT RIGHT KNEE WITH POLY EXCHANGE;  Surgeon: Dannielle Huh, MD;  [facility];  Service: Orthopedics;  Laterality: Right;  . INGUINAL HERNIA REPAIR Bilateral 05/22/2013   Procedure: REPAIR OF RECURRENT INCARCERATED INGUINAL HERNIA WITH MESH RIGHT SIDE,   REPAIR OF RECURRENT INGUINAL HERNIA WITH MESH LEFT SIDE;  Surgeon: Ernestene Mention, MD;  [facility];  Service: General;  Laterality: Bilateral;  . INSERTION OF MESH Bilateral 05/22/2013   Procedure: INSERTION OF MESH;  Surgeon: Ernestene Mention, MD;  Location[facility] OR;  Service: General;  Laterality: Bilateral;  . IRRIGATION AND DEBRIDEMENT KNEE Right 02/22/2016   Procedure: IRRIGATION AND DEBRIDEMENT KNEE;  Surgeon: Dannielle Huh, MD;  [facility];  Service: Orthopedics;  Laterality: Right;  . Irrigation and Debridement right knee  03/23/2009   Dr. Hyacinth Meeker, [facility]  . JOINT REPLACEMENT  03/16/2009  RIGHT KNEE REPLACEMENT  . KNEE ARTHROSCOPY WITH EXCISION BAKER'S CYST Right 10/01/14  . KNEE SURGERY    . Radionuclide Imaging Study     Normal LV systolic function with ejection of 50%; Normal myocardial perfusion without evidence of myocardial ischemia  . REPLACEMENT TOTAL KNEE     Bilateral knee replacement, 2010, 2012  . TONSILLECTOMY     Social History   Occupational History  . Tour manager   Social History Main Topics  . Smoking status: Never Smoker  . Smokeless tobacco: Never Used  . Alcohol use No  . Drug use: No  . Sexual activity: Not on file

## 2017-06-05 ENCOUNTER — Encounter (INDEPENDENT_AMBULATORY_CARE_PROVIDER_SITE_OTHER): Payer: Self-pay | Admitting: Internal Medicine

## 2017-07-05 ENCOUNTER — Ambulatory Visit (INDEPENDENT_AMBULATORY_CARE_PROVIDER_SITE_OTHER): Payer: PPO | Admitting: Family Medicine

## 2017-07-05 ENCOUNTER — Encounter: Payer: Self-pay | Admitting: Family Medicine

## 2017-07-05 VITALS — BP 120/70 | HR 75 | Temp 98.6°F | Resp 16 | Wt 222.0 lb

## 2017-07-05 DIAGNOSIS — J40 Bronchitis, not specified as acute or chronic: Secondary | ICD-10-CM

## 2017-07-05 MED ORDER — AZITHROMYCIN 250 MG PO TABS: ORAL_TABLET | ORAL | 0 refills | Status: DC

## 2017-07-05 NOTE — Progress Notes (Signed)
Patient: Stephen Burch Male    DOB: June 09, 1956   62 y.o.   MRN: [id number] Visit Date: 07/05/2017  Today's Provider: Mila Merry, MD   Chief Complaint  Patient presents with  . Cough   Subjective:    Patient has had a cough for 3 weeks. Cough is productive. Other symptoms include wheezing. Patient states cough is worse when he is up moving around. Patient has been taking otc mucinex and robitussin with only mild relief.    Cough  This is a new problem. The current episode started 1 to 4 weeks ago (3 weeks). The problem has been unchanged. The problem occurs every few minutes. The cough is productive of sputum. Associated symptoms include weight loss and wheezing. Pertinent negatives include no chest pain, chills, ear congestion, ear pain, fever, headaches, heartburn, hemoptysis, myalgias, nasal congestion, postnasal drip, rash, rhinorrhea, sore throat, shortness of breath or sweats. The symptoms are aggravated by exercise. Treatments tried: mucinex and robitussin. The treatment provided mild relief. His past medical history is significant for pneumonia. There is no history of asthma, bronchiectasis, bronchitis, COPD, emphysema or environmental allergies.       Allergies  Allergen Reactions  . Asa [Aspirin] Shortness Of Breath  . Penicillins Shortness Of Breath and Rash    Has patient had a PCN reaction causing immediate rash, facial/tongue/throat swelling, SOB or lightheadedness with hypotension: Yes Has patient had a PCN reaction causing severe rash involving mucus membranes or skin necrosis: No Has patient had a PCN reaction that required hospitalization No Has patient had a PCN reaction occurring within the last 10 years: No If all of the above answers are "NO", then may proceed with Cephalosporin use.   . Vancomycin Anaphylaxis    Immediately following Vancomycin administration in the OR patient Vfib arrested.     Current Outpatient Medications:  .  diclofenac sodium  (VOLTAREN) 1 % GEL, Apply 2 g topically 4 (four) times daily., Disp: 1 Tube, Rfl: 5 .  pantoprazole (PROTONIX) 40 MG tablet, Take 1 tablet (40 mg total) by mouth daily., Disp: 90 tablet, Rfl: 3  Review of Systems  Constitutional: Positive for weight loss. Negative for appetite change, chills and fever.  HENT: Positive for congestion. Negative for ear pain, postnasal drip, rhinorrhea and sore throat.   Respiratory: Positive for cough and wheezing. Negative for hemoptysis, chest tightness and shortness of breath.   Cardiovascular: Negative for chest pain and palpitations.  Gastrointestinal: Negative for abdominal pain, heartburn, nausea and vomiting.  Musculoskeletal: Negative for myalgias.  Skin: Negative for rash.  Allergic/Immunologic: Negative for environmental allergies.  Neurological: Negative for headaches.    Social History   Tobacco Use  . Smoking status: Never Smoker  . Smokeless tobacco: Never Used  Substance Use Topics  . Alcohol use: No   Objective:   BP 120/70 (BP Location: Right Arm, Patient Position: Sitting, Cuff Size: Large)   Pulse 75   Temp 98.6 F (37 C) (Oral)   Resp 16   Wt 222 lb (100.7 kg)   SpO2 95%   BMI 30.11 kg/m  Vitals:   07/05/17 1000  BP: 120/70  Pulse: 75  Resp: 16  Temp: 98.6 F (37 C)  TempSrc: Oral  SpO2: 95%  Weight: 222 lb (100.7 kg)     Physical Exam  General Appearance:    Alert, cooperative, no distress  HENT:   bilateral TM normal without fluid or infection, neck without nodes, sinuses nontender and nasal  mucosa pale and congested  Eyes:    PERRL, conjunctiva/corneas clear, EOM's intact       Lungs:     Occasional diffuse expiratory wheeze, no rales, , respirations unlabored  Heart:    Regular rate and rhythm  Neurologic:   Awake, alert, oriented x 3. No apparent focal neurological           defect.           Assessment & Plan:     1. Bronchitis  - azithromycin (ZITHROMAX) 250 MG tablet; 2 by mouth today, then 1  daily for 4 days  Dispense: 6 tablet; Refill: 0  Call if symptoms change or if not rapidly improving.          Mila Merry, MD  [facility]

## 2017-07-09 ENCOUNTER — Encounter (INDEPENDENT_AMBULATORY_CARE_PROVIDER_SITE_OTHER): Payer: Self-pay | Admitting: Internal Medicine

## 2017-07-09 ENCOUNTER — Encounter (INDEPENDENT_AMBULATORY_CARE_PROVIDER_SITE_OTHER): Payer: Self-pay | Admitting: *Deleted

## 2017-07-09 ENCOUNTER — Ambulatory Visit (INDEPENDENT_AMBULATORY_CARE_PROVIDER_SITE_OTHER): Payer: PPO | Admitting: Internal Medicine

## 2017-07-09 VITALS — BP 112/70 | HR 66 | Temp 98.3°F | Resp 18 | Ht 72.0 in | Wt 215.8 lb

## 2017-07-09 DIAGNOSIS — K219 Gastro-esophageal reflux disease without esophagitis: Secondary | ICD-10-CM | POA: Diagnosis not present

## 2017-07-09 DIAGNOSIS — K588 Other irritable bowel syndrome: Secondary | ICD-10-CM | POA: Diagnosis not present

## 2017-07-09 DIAGNOSIS — K7460 Unspecified cirrhosis of liver: Secondary | ICD-10-CM | POA: Diagnosis not present

## 2017-07-09 NOTE — Progress Notes (Signed)
Presenting complaint;  Follow-up for chronic liver disease and GERD.  Subjective:  Stephen Burch is 62 year old Caucasian male who is here for scheduled visit.  He was last seen on January 15, 2017.  He was recently seen by Dr. [name] in begun on azithromycin for acute bronchitis.  He is feeling better.  He took last dose today. He states he is doing better as far as his knees are concerned but he is having problems with both wrists and is being followed by his orthopedic surgeon. He has no complaints.  His appetite is good but taste has changed since he has been on antibiotic.  He rarely has heartburn or regurgitation.  He denies dysphagia melena or rectal bleeding.  He would like to get refill on dicyclomine which he uses for postprandial bloating and discomfort and urgency sometimes.  He has not experienced any side effects with this medication.  He has been having issues with plantar fasciitis and has not resume walking.  He is doing light weights and using stepper for physical activity.  His weight is down by 2 pounds since his last visit. He is on disability.  He works 16 hours a week.   Current Medications: Outpatient Encounter Medications as of 07/09/2017  Medication Sig  . azithromycin (ZITHROMAX) 250 MG tablet 2 by mouth today, then 1 daily for 4 days  . diclofenac sodium (VOLTAREN) 1 % GEL Apply 2 g topically 4 (four) times daily.  . pantoprazole (PROTONIX) 40 MG tablet Take 1 tablet (40 mg total) by mouth daily.   No facility-administered encounter medications on file as of 07/09/2017.      Objective: Blood pressure 112/70, pulse 66, temperature 98.3 F (36.8 C), temperature source Oral, resp. rate 18, height 6' (1.829 m), weight 215 lb 12.8 oz (97.9 kg). Patient is alert and he does not have asterixis. Conjunctiva is pink. Sclera is nonicteric Oropharyngeal mucosa is normal. No neck masses or thyromegaly noted. Cardiac exam with regular rhythm normal S1 and S2. No murmur or  gallop noted. Lungs are clear to auscultation. Abdomen is symmetrical soft and nontender.  No organomegaly or masses. No LE edema or clubbing noted. He has vitiligo involving both hands and forehead.  Labs/studies Results: Lab data from January 24, 2017 WBC 3.3, H&H 16 and 45.9 and platelet count 80 2K.  Glucose 116 Serum sodium 142, potassium 4.5, chloride 103, CO2 25 BUN 12 and creatinine 1.01.  Bilirubin 0.9, AP 104, AST 28, ALT 27, total protein 6.7 and albumin 4.6. Calcium 9.1.  AFP 2.6 on 06/19/2016.  Assessment:  #1.  Cirrhosis secondary to NAFLD.  He has well-preserved hepatic function.  EGD in June 2018 revealed grade 1 esophageal varices.  He had MR December 2017 revealing small hypervascular lesions in segment 7 unchanged from prior MR of June 2016.  He is due for [facility] screening.  #2.  Chronic GERD complicated by short segment Barrett's esophagus.  He is doing well with therapy.  #3.  Sporadic postprandial abdominal bloating and discomfort alleviated with dicyclomine appears to be due to IBS.   Plan:  List updated.  Azithromycin removed from the list as he took last dose today. Will check AFP today and schedule him for upper abdominal ultrasound. Dicyclomine 10 mg p.o. twice daily as needed. Office visit in 6 months.

## 2017-07-09 NOTE — Patient Instructions (Signed)
Physician will call with results of blood test and ultrasound when completed.

## 2017-07-10 DIAGNOSIS — K7460 Unspecified cirrhosis of liver: Secondary | ICD-10-CM | POA: Diagnosis not present

## 2017-07-10 MED ORDER — DICYCLOMINE HCL 10 MG PO CAPS: 10.0000 mg | ORAL_CAPSULE | Freq: Two times a day (BID) | ORAL | 5 refills | Status: DC | PRN

## 2017-07-11 LAB — AFP TUMOR MARKER: AFP TUMOR MARKER: 2.5 ng/mL (ref <6.1)

## 2017-07-12 ENCOUNTER — Ambulatory Visit (HOSPITAL_COMMUNITY)
Admission: RE | Admit: 2017-07-12 | Discharge: 2017-07-12 | Disposition: A | Discharge status: Home / Self Care | Payer: PPO | Source: Ambulatory Visit | Attending: Internal Medicine | Admitting: Internal Medicine

## 2017-07-12 DIAGNOSIS — K7689 Other specified diseases of liver: Secondary | ICD-10-CM | POA: Diagnosis not present

## 2017-07-12 DIAGNOSIS — K7460 Unspecified cirrhosis of liver: Secondary | ICD-10-CM | POA: Diagnosis not present

## 2017-07-12 DIAGNOSIS — K8020 Calculus of gallbladder without cholecystitis without obstruction: Secondary | ICD-10-CM | POA: Insufficient documentation

## 2017-07-12 DIAGNOSIS — R161 Splenomegaly, not elsewhere classified: Secondary | ICD-10-CM | POA: Diagnosis not present

## 2017-08-08 DIAGNOSIS — H5203 Hypermetropia, bilateral: Secondary | ICD-10-CM | POA: Diagnosis not present

## 2017-08-08 DIAGNOSIS — H52223 Regular astigmatism, bilateral: Secondary | ICD-10-CM | POA: Diagnosis not present

## 2017-08-08 DIAGNOSIS — H524 Presbyopia: Secondary | ICD-10-CM | POA: Diagnosis not present

## 2017-08-09 ENCOUNTER — Telehealth (INDEPENDENT_AMBULATORY_CARE_PROVIDER_SITE_OTHER): Payer: Self-pay | Admitting: Orthopaedic Surgery

## 2017-08-09 NOTE — Telephone Encounter (Signed)
Patient called stating that he would like to know who he would refer him to for his wrist surgery.  CB#[phone].  Thank you.

## 2017-08-12 ENCOUNTER — Telehealth (INDEPENDENT_AMBULATORY_CARE_PROVIDER_SITE_OTHER): Payer: Self-pay | Admitting: Orthopaedic Surgery

## 2017-08-12 NOTE — Telephone Encounter (Signed)
Dr. Milly Jakob at [facility]

## 2017-08-12 NOTE — Telephone Encounter (Signed)
See message below °

## 2017-08-12 NOTE — Telephone Encounter (Signed)
Patient called asked for a call back concerning which hand specialist he will be referred to. Patient advised he has some question for the surgeon prior to surgery. The number to contact patient is [phone]

## 2017-08-14 NOTE — Telephone Encounter (Signed)
IC patient and LMVM for him advising Dr Milly Jakob and to call us if he wants this referral made.

## 2017-08-15 ENCOUNTER — Telehealth (INDEPENDENT_AMBULATORY_CARE_PROVIDER_SITE_OTHER): Payer: Self-pay | Admitting: Orthopaedic Surgery

## 2017-08-15 NOTE — Telephone Encounter (Signed)
Patient called needing copy of records asap. Copy is ready at front desk and he will sign release when he comes to pick up

## 2017-08-23 ENCOUNTER — Telehealth: Payer: Self-pay | Admitting: Family Medicine

## 2017-08-27 DIAGNOSIS — G8929 Other chronic pain: Secondary | ICD-10-CM | POA: Diagnosis not present

## 2017-08-27 DIAGNOSIS — M19132 Post-traumatic osteoarthritis, left wrist: Secondary | ICD-10-CM | POA: Diagnosis not present

## 2017-08-27 DIAGNOSIS — M25532 Pain in left wrist: Secondary | ICD-10-CM | POA: Diagnosis not present

## 2017-08-28 ENCOUNTER — Other Ambulatory Visit: Payer: Self-pay | Admitting: Orthopedic Surgery

## 2017-08-28 DIAGNOSIS — D485 Neoplasm of uncertain behavior of skin: Secondary | ICD-10-CM | POA: Diagnosis not present

## 2017-08-28 DIAGNOSIS — H52223 Regular astigmatism, bilateral: Secondary | ICD-10-CM | POA: Diagnosis not present

## 2017-08-28 DIAGNOSIS — L308 Other specified dermatitis: Secondary | ICD-10-CM | POA: Diagnosis not present

## 2017-08-28 DIAGNOSIS — L309 Dermatitis, unspecified: Secondary | ICD-10-CM | POA: Diagnosis not present

## 2017-08-28 DIAGNOSIS — Z961 Presence of intraocular lens: Secondary | ICD-10-CM | POA: Diagnosis not present

## 2017-09-02 ENCOUNTER — Other Ambulatory Visit: Payer: Self-pay | Admitting: Orthopedic Surgery

## 2017-09-03 ENCOUNTER — Other Ambulatory Visit: Payer: Self-pay | Admitting: Orthopedic Surgery

## 2017-09-09 NOTE — Telephone Encounter (Signed)
LMTCB and schedule AWV with nha.  -MM

## 2017-10-02 ENCOUNTER — Ambulatory Visit (INDEPENDENT_AMBULATORY_CARE_PROVIDER_SITE_OTHER): Payer: PPO

## 2017-10-02 VITALS — BP 110/72 | HR 84 | Temp 99.2°F | Ht 72.0 in | Wt 222.4 lb

## 2017-10-02 DIAGNOSIS — Z0000 Encounter for general adult medical examination without abnormal findings: Secondary | ICD-10-CM | POA: Diagnosis not present

## 2017-10-02 NOTE — Patient Instructions (Signed)
Stephen Burch , Thank you for taking time to come for your Medicare Wellness Visit. I appreciate your ongoing commitment to your health goals. Please review the following plan we discussed and let me know if I can assist you in the future.   Screening recommendations/referrals: Colonoscopy: Pt declines today.  Recommended yearly ophthalmology/optometry visit for glaucoma screening and checkup Recommended yearly dental visit for hygiene and checkup  Vaccinations: Influenza vaccine: N/A Pneumococcal vaccine: N/A Tdap vaccine: Pt declines today.  Shingles vaccine: Pt declines today.     Advanced directives: Advance directive discussed with you today. Even though you declined this today please call our office should you change your mind and we can give you the proper paperwork for you to fill out.  Conditions/risks identified: Recommend increasing water intake to 4-6 glasses a day.   Next appointment: 10/10/17 @ 10:00 AM with Dr Caryn Section.   Preventive Care 40-64 Years, Male Preventive care refers to lifestyle choices and visits with your health care provider that can promote health and wellness. What does preventive care include?  A yearly physical exam. This is also called an annual well check.  Dental exams once or twice a year.  Routine eye exams. Ask your health care provider how often you should have your eyes checked.  Personal lifestyle choices, including:  Daily care of your teeth and gums.  Regular physical activity.  Eating a healthy diet.  Avoiding tobacco and drug use.  Limiting alcohol use.  Practicing safe sex.  Taking low-dose aspirin every day starting at age 42. What happens during an annual well check? The services and screenings done by your health care provider during your annual well check will depend on your age, overall health, lifestyle risk factors, and family history of disease. Counseling  Your health care provider may ask you questions about  your:  Alcohol use.  Tobacco use.  Drug use.  Emotional well-being.  Home and relationship well-being.  Sexual activity.  Eating habits.  Work and work Statistician. Screening  You may have the following tests or measurements:  Height, weight, and BMI.  Blood pressure.  Lipid and cholesterol levels. These may be checked every 5 years, or more frequently if you are over 68 years old.  Skin check.  Lung cancer screening. You may have this screening every year starting at age 56 if you have a 30-pack-year history of smoking and currently smoke or have quit within the past 15 years.  Fecal occult blood test (FOBT) of the stool. You may have this test every year starting at age 70.  Flexible sigmoidoscopy or colonoscopy. You may have a sigmoidoscopy every 5 years or a colonoscopy every 10 years starting at age 59.  Prostate cancer screening. Recommendations will vary depending on your family history and other risks.  Hepatitis C blood test.  Hepatitis B blood test.  Sexually transmitted disease (STD) testing.  Diabetes screening. This is done by checking your blood sugar (glucose) after you have not eaten for a while (fasting). You may have this done every 1-3 years. Discuss your test results, treatment options, and if necessary, the need for more tests with your health care provider. Vaccines  Your health care provider may recommend certain vaccines, such as:  Influenza vaccine. This is recommended every year.  Tetanus, diphtheria, and acellular pertussis (Tdap, Td) vaccine. You may need a Td booster every 10 years.  Zoster vaccine. You may need this after age 44.  Pneumococcal 13-valent conjugate (PCV13) vaccine. You may need  this if you have certain conditions and have not been vaccinated.  Pneumococcal polysaccharide (PPSV23) vaccine. You may need one or two doses if you smoke cigarettes or if you have certain conditions. Talk to your health care provider about  which screenings and vaccines you need and how often you need them. This information is not intended to replace advice given to you by your health care provider. Make sure you discuss any questions you have with your health care provider. Document Released: 07/15/2015 Document Revised: 03/07/2016 Document Reviewed: 04/19/2015 Elsevier Interactive Patient Education  2017 [place] Prevention in the Home Falls can cause injuries. They can happen to people of all ages. There are many things you can do to make your home safe and to help prevent falls. What can I do on the outside of my home?  Regularly fix the edges of walkways and driveways and fix any cracks.  Remove anything that might make you trip as you walk through a door, such as a raised step or threshold.  Trim any bushes or trees on the path to your home.  Use bright outdoor lighting.  Clear any walking paths of anything that might make someone trip, such as rocks or tools.  Regularly check to see if handrails are loose or broken. Make sure that both sides of any steps have handrails.  Any raised decks and porches should have guardrails on the edges.  Have any leaves, snow, or ice cleared regularly.  Use sand or salt on walking paths during winter.  Clean up any spills in your garage right away. This includes oil or grease spills. What can I do in the bathroom?  Use night lights.  Install grab bars by the toilet and in the tub and shower. Do not use towel bars as grab bars.  Use non-skid mats or decals in the tub or shower.  If you need to sit down in the shower, use a plastic, non-slip stool.  Keep the floor dry. Clean up any water that spills on the floor as soon as it happens.  Remove soap buildup in the tub or shower regularly.  Attach bath mats securely with double-sided non-slip rug tape.  Do not have throw rugs and other things on the floor that can make you trip. What can I do in the  bedroom?  Use night lights.  Make sure that you have a light by your bed that is easy to reach.  Do not use any sheets or blankets that are too big for your bed. They should not hang down onto the floor.  Have a firm chair that has side arms. You can use this for support while you get dressed.  Do not have throw rugs and other things on the floor that can make you trip. What can I do in the kitchen?  Clean up any spills right away.  Avoid walking on wet floors.  Keep items that you use a lot in easy-to-reach places.  If you need to reach something above you, use a strong step stool that has a grab bar.  Keep electrical cords out of the way.  Do not use floor polish or wax that makes floors slippery. If you must use wax, use non-skid floor wax.  Do not have throw rugs and other things on the floor that can make you trip. What can I do with my stairs?  Do not leave any items on the stairs.  Make sure that there are handrails  on both sides of the stairs and use them. Fix handrails that are broken or loose. Make sure that handrails are as long as the stairways.  Check any carpeting to make sure that it is firmly attached to the stairs. Fix any carpet that is loose or worn.  Avoid having throw rugs at the top or bottom of the stairs. If you do have throw rugs, attach them to the floor with carpet tape.  Make sure that you have a light switch at the top of the stairs and the bottom of the stairs. If you do not have them, ask someone to add them for you. What else can I do to help prevent falls?  Wear shoes that:  Do not have high heels.  Have rubber bottoms.  Are comfortable and fit you well.  Are closed at the toe. Do not wear sandals.  If you use a stepladder:  Make sure that it is fully opened. Do not climb a closed stepladder.  Make sure that both sides of the stepladder are locked into place.  Ask someone to hold it for you, if possible.  Clearly mark and make  sure that you can see:  Any grab bars or handrails.  First and last steps.  Where the edge of each step is.  Use tools that help you move around (mobility aids) if they are needed. These include:  Canes.  Walkers.  Scooters.  Crutches.  Turn on the lights when you go into a dark area. Replace any light bulbs as soon as they burn out.  Set up your furniture so you have a clear path. Avoid moving your furniture around.  If any of your floors are uneven, fix them.  If there are any pets around you, be aware of where they are.  Review your medicines with your doctor. Some medicines can make you feel dizzy. This can increase your chance of falling. Ask your doctor what other things that you can do to help prevent falls. This information is not intended to replace advice given to you by your health care provider. Make sure you discuss any questions you have with your health care provider. Document Released: 04/14/2009 Document Revised: 11/24/2015 Document Reviewed: 07/23/2014 Elsevier Interactive Patient Education  2017 [employer].

## 2017-10-02 NOTE — Progress Notes (Signed)
Subjective:   Stephen Burch is a 62 y.o. male who presents for an Initial Medicare Annual Wellness Visit.  Review of Systems  N/A  Cardiac Risk Factors include: advanced age (>71mn, >>71women);male gender    Objective:    Today's Vitals   10/02/17 1029  BP: 110/72  Pulse: 84  Temp: 99.2 F (37.3 C)  TempSrc: Oral  Weight: 222 lb 6.4 oz (100.9 kg)  Height: 6' (1.829 m)  PainSc: 0-No pain   Body mass index is 30.16 kg/m.  Advanced Directives 10/02/2017 12/17/2016 05/16/2016 04/02/2016 04/01/2016 04/01/2016 03/04/2016  Does Patient Have a Medical Advance Directive? No No No No No No No  Would patient like information on creating a medical advance directive? No - Patient declined No - Patient declined No - patient declined information - - No - patient declined information -  Pre-existing out of facility DNR order (yellow form or pink MOST form) - - - - - - -    Current Medications (verified) Outpatient Encounter Medications as of 10/02/2017  Medication Sig  . pantoprazole (PROTONIX) 40 MG tablet Take 1 tablet (40 mg total) by mouth daily.  . diclofenac sodium (VOLTAREN) 1 % GEL Apply 2 g topically 4 (four) times daily. (Patient not taking: Reported on 10/02/2017)  . dicyclomine (BENTYL) 10 MG capsule Take 1 capsule (10 mg total) by mouth 2 (two) times daily as needed for spasms. (Patient not taking: Reported on 10/02/2017)   No facility-administered encounter medications on file as of 10/02/2017.     Allergies (verified) Asa [aspirin]; Penicillins; and Vancomycin   History: Past Medical History:  Diagnosis Date  . Arthritis    knees  . Asthma    as a kid  . Cirrhosis ([facility]   . GERD (gastroesophageal reflux disease)   . History of hiatal hernia   . Hx of cardiac arrest   . Kidney stones   . Liver cirrhosis ([place]   . NAFLD (nonalcoholic fatty liver disease) 04/04/2016  . Pneumonia    3 years ago  . Thrombocytopenia ([place]    Past Surgical History:  Procedure Laterality  Date  . APPLICATION OF WOUND VAC Right 02/22/2016   Procedure: APPLICATION OF WOUND VAC;  Surgeon: SVickey Huger MD;  [place]  Service: Orthopedics;  Laterality: Right;  . CARDIAC CATHETERIZATION N/A 02/24/2016   Procedure: Left Heart Cath and Coronary Angiography;  Surgeon: CBurnell Blanks MD;  [facility];  Service: Cardiovascular;  Laterality: N/A;  . COLONOSCOPY    . CT SCAN of Abdomen  06/27/2012   Cirrhosis with portal hypertension. Marked slenomegaly. Bilateral inguinal hernias. Right inguinal hernia contains part of the bladder. Report from [facility] 09/06/2014   Procedure: OPEN EXCISION BAKER'S CYST RIGHT KNEE;  Surgeon: SVickey Huger MD;  [place] [facility]  Service: Orthopedics;  Laterality: Right;  . [place] . ESOPHAGOGASTRODUODENOSCOPY N/A 09/17/2012   Procedure: ESOPHAGOGASTRODUODENOSCOPY (EGD);  Surgeon: NRogene Houston MD;  [facility];  Service: Endoscopy;  Laterality: N/A;  200  . ESOPHAGOGASTRODUODENOSCOPY N/A 12/15/2015   Procedure: ESOPHAGOGASTRODUODENOSCOPY (EGD);  Surgeon: NRogene Houston MD;  [facility];  Service: Endoscopy;  Laterality: N/A;  1245  . ESOPHAGOGASTRODUODENOSCOPY N/A 12/17/2016   Procedure: ESOPHAGOGASTRODUODENOSCOPY (EGD);  Surgeon: RRogene Houston MD;  [facility];  Service: Endoscopy;  Laterality: N/A;  210  . Exercise tredmill test  03/27/2005   Normal  .  HERNIA REPAIR Bilateral 05/22/2013, 11/1997   x 2 (Inguinal hernia) Dr. Dalbert Batman, Dr. Jacqlyn Larsen  . I&D KNEE WITH POLY EXCHANGE Right 04/02/2016   Procedure: IRRIGATION AND DEBRIDEMENT RIGHT KNEE WITH POLY EXCHANGE;  Surgeon: Vickey Huger, MD;  Location: [place];  Service: Orthopedics;  Laterality: Right;  . INGUINAL HERNIA REPAIR Bilateral 05/22/2013   Procedure: REPAIR OF RECURRENT INCARCERATED INGUINAL HERNIA WITH MESH RIGHT SIDE,  REPAIR OF RECURRENT INGUINAL HERNIA WITH MESH LEFT SIDE;   Surgeon: [name], MD;  Location: [place];  Service: General;  Laterality: Bilateral;  . INSERTION OF MESH Bilateral 05/22/2013   Procedure: INSERTION OF MESH;  Surgeon: [name], MD;  Location: [place];  Service: General;  Laterality: Bilateral;  . IRRIGATION AND DEBRIDEMENT KNEE Right 02/22/2016   Procedure: IRRIGATION AND DEBRIDEMENT KNEE;  Surgeon: Vickey Huger, MD;  Location: [place];  Service: Orthopedics;  Laterality: Right;  . Irrigation and Debridement right knee  03/23/2009   Dr. Sabra Heck, [facility]  . JOINT REPLACEMENT  03/16/2009   RIGHT KNEE REPLACEMENT  . KNEE ARTHROSCOPY WITH EXCISION BAKER'S CYST Right 10/01/14  . KNEE SURGERY    . Radionuclide Imaging Study     Normal LV systolic function with ejection of 50%; Normal myocardial perfusion without evidence of myocardial ischemia  . REPLACEMENT TOTAL KNEE     Bilateral knee replacement, 2010, 2012  . TONSILLECTOMY     Family History  Problem Relation Age of Onset  . Diabetes Mother        type 2, born 40  . Arthritis Father        died age 66  . GI Bleed Father        upper GI Bleed, non-ETOH cirrhosis  . Arthritis Brother   . Diabetes Brother        type 2  . Diabetes Paternal Uncle        type 2  . Heart attack Maternal Grandmother   . Heart attack Maternal Grandfather   . Colon cancer Neg Hx   . Colon polyps Neg Hx    Social History   Socioeconomic History  . Marital status: Legally Separated    Spouse name: Not on file  . Number of children: 2  . Years of education: Not on file  . Highest education level: 12th grade  Occupational History  . 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 Financial resource strain: Not hard at all  . Food insecurity:    Worry: Never true    Inability: Never true  . Transportation needs:    Medical: No    Non-medical: No  Tobacco Use  . Smoking status: Never Smoker  . Smokeless tobacco: Never Used  . Tobacco comment:  quit a couple years at age 7  Substance and Sexual Activity  . Alcohol use: Yes    Comment: rare - 1 drink/monthly  . Drug use: No  . Sexual activity: Not on file  Lifestyle  . Physical activity:    Days per week: Not on file    Minutes per session: Not on file  . Stress: Not at all  Relationships  . Social connections:    Talks on phone: Not on file    Gets together: Not on file    Attends religious service: Not on file    Active member of club or organization: Not on file    Attends meetings of clubs  or organizations: Not on file    Relationship status: Not on file  Other Topics Concern  . Not on file  Social History Narrative   ** Merged History Encounter **       Pt lives alone.   Tobacco Counseling Counseling given: Not Answered Comment: quit a couple years at age 70   Clinical Intake:  Pre-visit preparation completed: Yes  Pain : No/denies pain Pain Score: 0-No pain     Nutritional Status: BMI > 30  Obese Nutritional Risks: None Diabetes: No  How often do you need to have someone help you when you read instructions, pamphlets, or other written materials from your doctor or pharmacy?: 1 - Never  Interpreter Needed?: No  Information entered by :: [facility], LPN  Activities of Daily Living In your present state of health, do you have any difficulty performing the following activities: 10/02/2017  Hearing? N  Vision? N  Difficulty concentrating or making decisions? N  Walking or climbing stairs? N  Dressing or bathing? N  Doing errands, shopping? N  Preparing Food and eating ? N  Using the Toilet? N  In the past six months, have you accidently leaked urine? N  Do you have problems with loss of bowel control? N  Managing your Medications? N  Managing your Finances? N  Housekeeping or managing your Housekeeping? N  Some recent data might be hidden     Immunizations and Health Maintenance There is no immunization history for the selected  administration types on file for this patient. Health Maintenance Due  Topic Date Due  . TETANUS/TDAP  03/03/1973  . COLONOSCOPY  03/03/2004    Patient Care Team: Birdie Sons, MD as PCP - General (Family Medicine) Rogene Houston, MD as Consulting Physician (Gastroenterology) Charlotte Crumb, MD as Consulting Physician (Orthopedic Surgery)  Indicate any recent Medical Services you may have received from other than [facility] providers in the past year (date may be approximate).    Assessment:   This is a routine wellness examination for Loyde.  Hearing/Vision screen Vision Screening Comments: Pt goes to [facility] for vision checks yearly.   Dietary issues and exercise activities discussed: Current Exercise Habits: Home exercise routine, Frequency (Times/Week): 4, Intensity: Mild, Exercise limited by: None identified  Goals    . DIET - INCREASE WATER INTAKE     Recommend increasing water intake to 4-6 glasses a day.       Depression Screen PHQ 2/9 Scores 10/02/2017 10/02/2017 06/18/2016 05/07/2016  PHQ - 2 Score 0 0 0 0  PHQ- 9 Score 0 - - -    Fall Risk Fall Risk  10/02/2017 06/18/2016 05/07/2016  Falls in the past year? No No No    Is the patient's home free of loose throw rugs in walkways, pet beds, electrical cords, etc?   yes      Grab bars in the bathroom? no      Handrails on the stairs?   yes      Adequate lighting?   yes  Timed Get Up and Go performed: N/A  Cognitive Function: Pt declined screening today.       Screening Tests Health Maintenance  Topic Date Due  . TETANUS/TDAP  03/03/1973  . COLONOSCOPY  03/03/2004  . INFLUENZA VACCINE  01/30/2018  . Hepatitis C Screening  Completed  . HIV Screening  Completed    Qualifies for Shingles Vaccine? Due for Shingles vaccine. Declined my offer to administer today. Education has been provided regarding the  importance of this vaccine. Pt has been advised to call her insurance company to determine her out of  pocket expense. Advised she may also receive this vaccine at her local pharmacy or Health Dept. Verbalized acceptance and understanding.  Cancer Screenings: Lung: Low Dose CT Chest recommended if Age 84-80 years, 30 pack-year currently smoking OR have quit w/in 15years. Patient does not qualify. Colorectal: Pt declines referral today and states he will f/u with GI.   Additional Screenings:  Hepatitis C Screening: Up to date     Plan:  I have personally reviewed and addressed the Medicare Annual Wellness questionnaire and have noted the following in the patient's chart:  A. Medical and social history B. Use of alcohol, tobacco or illicit drugs  C. Current medications and supplements D. Functional ability and status E.  Nutritional status F.  Physical activity G. Advance directives H. List of other physicians I.  Hospitalizations, surgeries, and ER visits in previous 12 months J.  [place] such as hearing and vision if needed, cognitive and depression L. Referrals and appointments - none  In addition, I have reviewed and discussed with patient certain preventive protocols, quality metrics, and best practice recommendations. A written personalized care plan for preventive services as well as general preventive health recommendations were provided to patient.  See attached scanned questionnaire for additional information.   Signed,  Fabio Neighbors, LPN Nurse Health Advisor   Nurse Recommendations: Pt declined the tetanus vaccine and a colonoscopy referral today. Pt states he will f/u with his GI office about setting up a colonoscopy.

## 2017-10-04 ENCOUNTER — Ambulatory Visit (HOSPITAL_BASED_OUTPATIENT_CLINIC_OR_DEPARTMENT_OTHER): Admit: 2017-10-04 | Payer: PPO | Admitting: Orthopedic Surgery

## 2017-10-04 ENCOUNTER — Other Ambulatory Visit: Payer: Self-pay | Admitting: Orthopedic Surgery

## 2017-10-04 ENCOUNTER — Encounter (HOSPITAL_BASED_OUTPATIENT_CLINIC_OR_DEPARTMENT_OTHER): Payer: Self-pay

## 2017-10-04 SURGERY — CARPECTOMY
Anesthesia: General | Site: Wrist | Laterality: Left

## 2017-10-09 ENCOUNTER — Other Ambulatory Visit: Payer: Self-pay

## 2017-10-09 ENCOUNTER — Encounter (HOSPITAL_BASED_OUTPATIENT_CLINIC_OR_DEPARTMENT_OTHER): Payer: Self-pay | Admitting: *Deleted

## 2017-10-09 NOTE — Progress Notes (Addendum)
SPOKE W/ PT VIA PHONE FOR PRE-OP INTERVIEW.  NPO AFTER MN W/ EXCEPTION CLEAR LIQUIDS UNTIL 1130 (NO CREAM /MILK PRODUCTS).  ARRIVE AT 1130.  WILL TAKE PROTONIX AM DOS W/ SIPS OF WATER.  PT GETTING CBC DONE AT [name] Thursday 10-10-2017 @0900 .    ADDENDUM:  REVIEWED CHART W/ DR ROSE MDA, FACE TO FACE.  CONCERNING ABOUT LAST CBC DONE IN Epic DATED 01-24-2017 W/ PLATELET COUNT 82.  DR ROSE AGREED , DO CBC.

## 2017-10-10 ENCOUNTER — Other Ambulatory Visit (HOSPITAL_COMMUNITY)
Admission: RE | Admit: 2017-10-10 | Discharge: 2017-10-10 | Disposition: A | Discharge status: Home / Self Care | Payer: PPO | Source: Ambulatory Visit | Attending: Anesthesiology | Admitting: Anesthesiology

## 2017-10-10 ENCOUNTER — Encounter: Payer: Self-pay | Admitting: Family Medicine

## 2017-10-10 DIAGNOSIS — R69 Illness, unspecified: Secondary | ICD-10-CM | POA: Diagnosis not present

## 2017-10-10 LAB — CBC
HEMATOCRIT: 44.7 % (ref 39.0–52.0)
Hemoglobin: 15.1 g/dL (ref 13.0–17.0)
MCH: 30.2 pg (ref 26.0–34.0)
MCHC: 33.8 g/dL (ref 30.0–36.0)
MCV: 89.4 fL (ref 78.0–100.0)
PLATELETS: 68 10*3/uL — AB (ref 150–400)
RBC: 5 MIL/uL (ref 4.22–5.81)
RDW: 13.3 % (ref 11.5–15.5)
WBC: 2.9 10*3/uL — AB (ref 4.0–10.5)

## 2017-10-10 NOTE — Progress Notes (Addendum)
CBC result dated 10-10-2017 routed to dr Burney Gauze in epic.  Called and LVM for Dr Burney Gauze thru [facility] , or scheduler, about very low platelet count of 68 .  ADDENDUM: REVIEWED CBC RESULT W/ DR R. FIZTGERALD MDA, STATED AS FAR AS ANESTHESIA'S PART OF SURGERY , WOULD NOT BE A PROBLEM.  MAKE DR Burney Gauze BE AWARE , WHICH WAS DONE YESTERDAY.

## 2017-10-17 ENCOUNTER — Ambulatory Visit (HOSPITAL_BASED_OUTPATIENT_CLINIC_OR_DEPARTMENT_OTHER): Payer: PPO | Admitting: Anesthesiology

## 2017-10-17 ENCOUNTER — Ambulatory Visit (HOSPITAL_COMMUNITY): Payer: PPO

## 2017-10-17 ENCOUNTER — Ambulatory Visit (HOSPITAL_BASED_OUTPATIENT_CLINIC_OR_DEPARTMENT_OTHER)
Admission: RE | Admit: 2017-10-17 | Discharge: 2017-10-17 | Disposition: A | Discharge status: Home / Self Care | Payer: PPO | Source: Ambulatory Visit | Attending: Orthopedic Surgery | Admitting: Orthopedic Surgery

## 2017-10-17 ENCOUNTER — Encounter (HOSPITAL_BASED_OUTPATIENT_CLINIC_OR_DEPARTMENT_OTHER)
Admission: RE | Disposition: A | Discharge status: Home / Self Care | Payer: Self-pay | Source: Ambulatory Visit | Attending: Orthopedic Surgery

## 2017-10-17 ENCOUNTER — Encounter (HOSPITAL_BASED_OUTPATIENT_CLINIC_OR_DEPARTMENT_OTHER): Payer: Self-pay

## 2017-10-17 DIAGNOSIS — Z419 Encounter for procedure for purposes other than remedying health state, unspecified: Secondary | ICD-10-CM

## 2017-10-17 DIAGNOSIS — K219 Gastro-esophageal reflux disease without esophagitis: Secondary | ICD-10-CM | POA: Insufficient documentation

## 2017-10-17 DIAGNOSIS — Z880 Allergy status to penicillin: Secondary | ICD-10-CM | POA: Diagnosis not present

## 2017-10-17 DIAGNOSIS — Z87891 Personal history of nicotine dependence: Secondary | ICD-10-CM | POA: Insufficient documentation

## 2017-10-17 DIAGNOSIS — G8929 Other chronic pain: Secondary | ICD-10-CM | POA: Diagnosis not present

## 2017-10-17 DIAGNOSIS — M13832 Other specified arthritis, left wrist: Secondary | ICD-10-CM | POA: Diagnosis not present

## 2017-10-17 DIAGNOSIS — Z881 Allergy status to other antibiotic agents status: Secondary | ICD-10-CM | POA: Insufficient documentation

## 2017-10-17 DIAGNOSIS — Z79899 Other long term (current) drug therapy: Secondary | ICD-10-CM | POA: Insufficient documentation

## 2017-10-17 DIAGNOSIS — Z8674 Personal history of sudden cardiac arrest: Secondary | ICD-10-CM | POA: Diagnosis not present

## 2017-10-17 DIAGNOSIS — M25532 Pain in left wrist: Secondary | ICD-10-CM | POA: Diagnosis not present

## 2017-10-17 DIAGNOSIS — Z886 Allergy status to analgesic agent status: Secondary | ICD-10-CM | POA: Diagnosis not present

## 2017-10-17 DIAGNOSIS — M19032 Primary osteoarthritis, left wrist: Secondary | ICD-10-CM | POA: Diagnosis not present

## 2017-10-17 DIAGNOSIS — G8918 Other acute postprocedural pain: Secondary | ICD-10-CM | POA: Diagnosis not present

## 2017-10-17 HISTORY — DX: Personal history of other diseases of the respiratory system: Z87.09

## 2017-10-17 HISTORY — DX: Nonalcoholic steatohepatitis (NASH): K75.81

## 2017-10-17 HISTORY — DX: Other complications of anesthesia, initial encounter: T88.59XA

## 2017-10-17 HISTORY — DX: Other cirrhosis of liver: K74.69

## 2017-10-17 HISTORY — DX: Personal history of other venous thrombosis and embolism: Z86.718

## 2017-10-17 HISTORY — DX: Diaphragmatic hernia without obstruction or gangrene: K44.9

## 2017-10-17 HISTORY — DX: Esophageal varices without bleeding: I85.00

## 2017-10-17 HISTORY — DX: Unspecified cirrhosis of liver: K74.60

## 2017-10-17 HISTORY — DX: Barrett's esophagus without dysplasia: K22.70

## 2017-10-17 HISTORY — DX: Post-traumatic osteoarthritis, left wrist: M19.132

## 2017-10-17 HISTORY — PX: CARPECTOMY: SHX5004

## 2017-10-17 HISTORY — DX: Personal history of other infectious and parasitic diseases: Z86.19

## 2017-10-17 HISTORY — DX: Personal history of colonic polyps: Z86.010

## 2017-10-17 HISTORY — DX: Personal history of adenomatous and serrated colon polyps: Z86.0101

## 2017-10-17 HISTORY — DX: Personal history of urinary calculi: Z87.442

## 2017-10-17 HISTORY — DX: Adverse effect of unspecified anesthetic, initial encounter: T41.45XA

## 2017-10-17 HISTORY — DX: Other specified postprocedural states: Z98.890

## 2017-10-17 HISTORY — PX: WRIST ARTHROPLASTY: SHX1088

## 2017-10-17 SURGERY — CARPECTOMY
Anesthesia: General | Site: Wrist | Laterality: Left

## 2017-10-17 MED ORDER — ROPIVACAINE HCL 5 MG/ML IJ SOLN
INTRAMUSCULAR | Status: AC
  Filled 2017-10-17: qty 30

## 2017-10-17 MED ORDER — SODIUM CHLORIDE 0.9 % IR SOLN
Status: DC | PRN
  Administered 2017-10-17: 1000 mL

## 2017-10-17 MED ORDER — FENTANYL CITRATE (PF) 100 MCG/2ML IJ SOLN
INTRAMUSCULAR | Status: AC
  Filled 2017-10-17: qty 2

## 2017-10-17 MED ORDER — LIDOCAINE-EPINEPHRINE 2 %-1:100000 IJ SOLN
INTRAMUSCULAR | Status: DC | PRN
  Administered 2017-10-17: 15 mL via INTRADERMAL

## 2017-10-17 MED ORDER — ONDANSETRON HCL 4 MG/2ML IJ SOLN
INTRAMUSCULAR | Status: DC | PRN
  Administered 2017-10-17: 4 mg via INTRAVENOUS

## 2017-10-17 MED ORDER — PROMETHAZINE HCL 25 MG/ML IJ SOLN
6.2500 mg | INTRAMUSCULAR | Status: DC | PRN
  Filled 2017-10-17: qty 1

## 2017-10-17 MED ORDER — LACTATED RINGERS IV SOLN
INTRAVENOUS | Status: DC
  Administered 2017-10-17 (×2): via INTRAVENOUS
  Filled 2017-10-17: qty 1000

## 2017-10-17 MED ORDER — DEXAMETHASONE SODIUM PHOSPHATE 10 MG/ML IJ SOLN
INTRAMUSCULAR | Status: DC | PRN
  Administered 2017-10-17: 4 mg via INTRAVENOUS

## 2017-10-17 MED ORDER — MIDAZOLAM HCL 2 MG/2ML IJ SOLN
INTRAMUSCULAR | Status: AC
  Filled 2017-10-17: qty 2

## 2017-10-17 MED ORDER — LIDOCAINE 2% (20 MG/ML) 5 ML SYRINGE
INTRAMUSCULAR | Status: AC
  Filled 2017-10-17: qty 5

## 2017-10-17 MED ORDER — MIDAZOLAM HCL 2 MG/2ML IJ SOLN
1.0000 mg | Freq: Once | INTRAMUSCULAR | Status: AC
  Administered 2017-10-17: 1 mg via INTRAVENOUS
  Filled 2017-10-17: qty 1

## 2017-10-17 MED ORDER — LIDOCAINE 2% (20 MG/ML) 5 ML SYRINGE
INTRAMUSCULAR | Status: DC | PRN
  Administered 2017-10-17: 100 mg via INTRAVENOUS

## 2017-10-17 MED ORDER — LIDOCAINE 2% (20 MG/ML) 5 ML SYRINGE
INTRAMUSCULAR | Status: AC
Start: 2017-10-17 — End: ?
  Filled 2017-10-17: qty 5

## 2017-10-17 MED ORDER — OXYCODONE HCL 5 MG/5ML PO SOLN
5.0000 mg | Freq: Once | ORAL | Status: AC | PRN
  Filled 2017-10-17: qty 5

## 2017-10-17 MED ORDER — OXYCODONE HCL 5 MG PO TABS
ORAL_TABLET | ORAL | Status: AC
  Filled 2017-10-17: qty 1

## 2017-10-17 MED ORDER — LIDOCAINE-EPINEPHRINE 2 %-1:200000 IJ SOLN
INTRAMUSCULAR | Status: AC
Start: 2017-10-17 — End: ?
  Filled 2017-10-17: qty 20

## 2017-10-17 MED ORDER — FENTANYL CITRATE (PF) 100 MCG/2ML IJ SOLN
100.0000 ug | Freq: Once | INTRAMUSCULAR | Status: AC
  Administered 2017-10-17: 100 ug via INTRAVENOUS
  Filled 2017-10-17: qty 2

## 2017-10-17 MED ORDER — ROPIVACAINE HCL 5 MG/ML IJ SOLN
INTRAMUSCULAR | Status: DC | PRN
  Administered 2017-10-17: 30 mL via PERINEURAL

## 2017-10-17 MED ORDER — CLINDAMYCIN PHOSPHATE 900 MG/50ML IV SOLN
INTRAVENOUS | Status: AC
  Filled 2017-10-17: qty 50

## 2017-10-17 MED ORDER — CLINDAMYCIN PHOSPHATE 900 MG/50ML IV SOLN
900.0000 mg | INTRAVENOUS | Status: AC
  Administered 2017-10-17: 900 mg via INTRAVENOUS
  Filled 2017-10-17: qty 50

## 2017-10-17 MED ORDER — PROPOFOL 10 MG/ML IV BOLUS
INTRAVENOUS | Status: DC | PRN
  Administered 2017-10-17: 200 mg via INTRAVENOUS

## 2017-10-17 MED ORDER — FENTANYL CITRATE (PF) 100 MCG/2ML IJ SOLN
25.0000 ug | INTRAMUSCULAR | Status: DC | PRN
  Administered 2017-10-17: 50 ug via INTRAVENOUS
  Filled 2017-10-17: qty 1

## 2017-10-17 MED ORDER — OXYCODONE HCL 5 MG PO TABS
5.0000 mg | ORAL_TABLET | Freq: Once | ORAL | Status: AC | PRN
  Administered 2017-10-17: 5 mg via ORAL
  Filled 2017-10-17: qty 1

## 2017-10-17 MED ORDER — PROPOFOL 500 MG/50ML IV EMUL
INTRAVENOUS | Status: DC | PRN
  Administered 2017-10-17: 25 ug/kg/min via INTRAVENOUS

## 2017-10-17 MED ORDER — PROPOFOL 10 MG/ML IV BOLUS
INTRAVENOUS | Status: AC
  Filled 2017-10-17: qty 20

## 2017-10-17 MED ORDER — EPHEDRINE SULFATE-NACL 50-0.9 MG/10ML-% IV SOSY
PREFILLED_SYRINGE | INTRAVENOUS | Status: DC | PRN
  Administered 2017-10-17 (×4): 5 mg via INTRAVENOUS

## 2017-10-17 MED ORDER — CHLORHEXIDINE GLUCONATE 4 % EX LIQD
60.0000 mL | Freq: Once | CUTANEOUS | Status: DC
  Filled 2017-10-17: qty 118

## 2017-10-17 SURGICAL SUPPLY — 84 items
APL SKNCLS STERI-STRIP NONHPOA (GAUZE/BANDAGES/DRESSINGS)
BAG DECANTER FOR FLEXI CONT (MISCELLANEOUS) IMPLANT
BANDAGE ACE 3X5.8 VEL STRL LF (GAUZE/BANDAGES/DRESSINGS) ×6 IMPLANT
BANDAGE ACE 4X5 VEL STRL LF (GAUZE/BANDAGES/DRESSINGS) ×3 IMPLANT
BENZOIN TINCTURE PRP APPL 2/3 (GAUZE/BANDAGES/DRESSINGS) IMPLANT
BLADE SURG 15 STRL LF DISP TIS (BLADE) ×1 IMPLANT
BLADE SURG 15 STRL SS (BLADE) ×3
BNDG CMPR 9X4 STRL LF SNTH (GAUZE/BANDAGES/DRESSINGS) ×1
BNDG ELASTIC 2X5.8 VLCR STR LF (GAUZE/BANDAGES/DRESSINGS) IMPLANT
BNDG ESMARK 4X9 LF (GAUZE/BANDAGES/DRESSINGS) ×3 IMPLANT
BNDG GAUZE ELAST 4 BULKY (GAUZE/BANDAGES/DRESSINGS) ×9 IMPLANT
CANISTER SUCTION 1200CC (MISCELLANEOUS) IMPLANT
CLOSURE WOUND 1/2 X4 (GAUZE/BANDAGES/DRESSINGS) ×1
CMPNT CRPLCNTR 35X23X15X (Orthopedic Implant) ×1 IMPLANT
COMPONENT CRPLCNTR 35X23X15X (Orthopedic Implant) ×1 IMPLANT
CORD BIPOLAR FORCEPS 12FT (ELECTRODE) ×3 IMPLANT
COVER BACK TABLE 60X90IN (DRAPES) ×3 IMPLANT
CUFF TOURNIQUET SINGLE 18IN (TOURNIQUET CUFF) ×3 IMPLANT
DECANTER SPIKE VIAL GLASS SM (MISCELLANEOUS) IMPLANT
DRAPE C-ARM 42X72 X-RAY (DRAPES) ×3 IMPLANT
DRAPE EXTREMITY T 121X128X90 (DRAPE) ×3 IMPLANT
DRAPE SHEET LG 3/4 BI-LAMINATE (DRAPES) ×6 IMPLANT
DRAPE SURG 17X23 STRL (DRAPES) IMPLANT
DURAPREP 26ML APPLICATOR (WOUND CARE) ×3 IMPLANT
ELECT REM PT RETURN 9FT ADLT (ELECTROSURGICAL)
ELECTRODE REM PT RTRN 9FT ADLT (ELECTROSURGICAL) IMPLANT
GAUZE SPONGE 4X4 12PLY STRL (GAUZE/BANDAGES/DRESSINGS) ×3 IMPLANT
GAUZE SPONGE 4X4 16PLY XRAY LF (GAUZE/BANDAGES/DRESSINGS) ×3 IMPLANT
GAUZE XEROFORM 1X8 LF (GAUZE/BANDAGES/DRESSINGS) IMPLANT
GLOVE BIO SURGEON STRL SZ 6.5 (GLOVE) ×2 IMPLANT
GLOVE BIO SURGEON STRL SZ7 (GLOVE) ×3 IMPLANT
GLOVE BIO SURGEONS STRL SZ 6.5 (GLOVE) ×1
GLOVE BIOGEL PI IND STRL 7.0 (GLOVE) ×1 IMPLANT
GLOVE BIOGEL PI INDICATOR 7.0 (GLOVE) ×2
GLOVE ECLIPSE 8.0 STRL XLNG CF (GLOVE) ×3 IMPLANT
GLOVE INDICATOR 8.0 STRL GRN (GLOVE) ×3 IMPLANT
GLOVE SURG SYN 8.0 (GLOVE) ×6 IMPLANT
GOWN STRL REUS W/ TWL LRG LVL3 (GOWN DISPOSABLE) ×4 IMPLANT
GOWN STRL REUS W/TWL LRG LVL3 (GOWN DISPOSABLE) ×12
IMPL CAPITATE TAPR POST 7.5 (Orthopedic Implant) ×1 IMPLANT
IMPLANT CAPITATE COMP 35X23 (Orthopedic Implant) ×3 IMPLANT
IMPLANT CAPITATE TAPR POST 7.5 (Orthopedic Implant) ×3 IMPLANT
K-WIRE .045X4 (WIRE) ×3 IMPLANT
MANIFOLD NEPTUNE II (INSTRUMENTS) IMPLANT
NEEDLE HYPO 25X1 1.5 SAFETY (NEEDLE) ×3 IMPLANT
NS IRRIG 1000ML POUR BTL (IV SOLUTION) ×3 IMPLANT
PACK BASIN DAY SURGERY FS (CUSTOM PROCEDURE TRAY) ×3 IMPLANT
PAD CAST 3X4 CTTN HI CHSV (CAST SUPPLIES) ×3 IMPLANT
PAD CAST 4YDX4 CTTN HI CHSV (CAST SUPPLIES) IMPLANT
PADDING CAST ABS 4INX4YD NS (CAST SUPPLIES) ×2
PADDING CAST ABS COTTON 4X4 ST (CAST SUPPLIES) ×1 IMPLANT
PADDING CAST COTTON 3X4 STRL (CAST SUPPLIES) ×9
PADDING CAST COTTON 4X4 STRL (CAST SUPPLIES)
PADDING UNDERCAST 2 STRL (CAST SUPPLIES)
PADDING UNDERCAST 2X4 STRL (CAST SUPPLIES) IMPLANT
PENCIL BUTTON HOLSTER BLD 10FT (ELECTRODE) ×3 IMPLANT
SLING ARM FOAM STRAP LRG (SOFTGOODS) ×3 IMPLANT
SPLINT PLASTER CAST XFAST 4X15 (CAST SUPPLIES) ×1 IMPLANT
SPLINT PLASTER XTRA FAST SET 4 (CAST SUPPLIES) ×2
STOCKINETTE 4X48 STRL (DRAPES) ×3 IMPLANT
STRIP CLOSURE SKIN 1/2X4 (GAUZE/BANDAGES/DRESSINGS) ×2 IMPLANT
SUCTION FRAZIER HANDLE 10FR (MISCELLANEOUS)
SUCTION TUBE FRAZIER 10FR DISP (MISCELLANEOUS) IMPLANT
SUT ETHIBOND 3-0 V-5 (SUTURE) IMPLANT
SUT ETHILON 4 0 PS 2 18 (SUTURE) IMPLANT
SUT FIBERWIRE #2 38 T-5 BLUE (SUTURE) ×3
SUT MERSILENE 3 0 FS 1 (SUTURE) IMPLANT
SUT MERSILENE 4 0 P 3 (SUTURE) IMPLANT
SUT PROLENE 3 0 PS 2 (SUTURE) ×6 IMPLANT
SUT SILK 2 0 PERMA HAND 18 BK (SUTURE) IMPLANT
SUT SILK 4 0 PS 2 (SUTURE) IMPLANT
SUT VIC AB 3-0 FS2 27 (SUTURE) IMPLANT
SUT VIC AB 4-0 P-3 18XBRD (SUTURE) IMPLANT
SUT VIC AB 4-0 P3 18 (SUTURE)
SUT VICRYL RAPIDE 4-0 (SUTURE) IMPLANT
SUT VICRYL RAPIDE 4/0 PS 2 (SUTURE) IMPLANT
SUTURE FIBERWR #2 38 T-5 BLUE (SUTURE) ×1 IMPLANT
SYR 10ML LL (SYRINGE) ×3 IMPLANT
SYR BULB 3OZ (MISCELLANEOUS) ×3 IMPLANT
TAPE STRIPS DRAPE STRL (GAUZE/BANDAGES/DRESSINGS) ×3 IMPLANT
TOWEL OR 17X24 6PK STRL BLUE (TOWEL DISPOSABLE) ×9 IMPLANT
TUBE CONNECTING 12'X1/4 (SUCTIONS) ×1
TUBE CONNECTING 12X1/4 (SUCTIONS) ×2 IMPLANT
UNDERPAD 30X30 (UNDERPADS AND DIAPERS) ×3 IMPLANT

## 2017-10-17 NOTE — Transfer of Care (Signed)
Immediate Anesthesia Transfer of Care Note  Patient: Stephen Burch  Procedure(s) Performed: Procedure(s) (LRB): LEFT WRIST PROXIMAL ROW CARPECTOMY WITH POSTEROR IMBROSSEOUS NERVE EXCISION (Left) CAPITATE RESURFACING ARTHROPLASTY (Left)  Patient Location: PACU  Anesthesia Type: General  Level of Consciousness: awake, oriented, sedated and patient cooperative  Airway & Oxygen Therapy: Patient Spontanous Breathing and Patient connected to face mask oxygen  Post-op Assessment: Report given to PACU RN and Post -op Vital signs reviewed and stable  Post vital signs: Reviewed and stable  Complications: No apparent anesthesia complications  Last Vitals:  Vitals Value Taken Time  BP 108/65 10/17/2017  3:37 PM  Temp    Pulse 75 10/17/2017  3:39 PM  Resp 17 10/17/2017  3:39 PM  SpO2 99 % 10/17/2017  3:39 PM  Vitals shown include unvalidated device data.  Last Pain:  Vitals:   10/17/17 1137  TempSrc: Oral      Patients Stated Pain Goal: 5 (10/17/17 1201)

## 2017-10-17 NOTE — H&P (Signed)
Stephen Burch is an 62 y.o. male.   Chief Complaint: Left wrist pain, swelling, and decreased range of motion HPI: Patient is a very pleasant 62 year old male with a chief complaint of chronic left wrist pain with decreased range of motion and swelling and radiographs that reveal a scapholunate advanced collapse pattern wrist.  Past Medical History:  Diagnosis Date  . Arthritis    knees, wrists  . Barrett's esophagus   . Complication of anesthesia    02-22-2016 intraop cardiac arrest immediately following moving pt into prone position and vancomyocin administration , pt cardiac arrest w/ Vfib,  please refer to anesthesia record in epic  . Esophageal varices determined by endoscopy ([place]) 11/2016   grade 1  . GERD (gastroesophageal reflux disease)   . Hiatal hernia   . History of adenomatous polyp of colon   . History of asthma    as child  . History of DVT (deep vein thrombosis)    right lower extremitty post-op right total knee surgery 09/ 2010,  treated w/ coumadin  . History of esophageal dilatation 07/1998   for schatzski ring  . History of kidney stones   . History of staph infection 04/2016   MSSA infection of right total knee w/ sepsis  . Hx of cardiac arrest    02-22-2016  intraoperative, immediately following moving pt into prone position and vancomyocin administration, cardiac arrest w/ Vfib (referred to anesthesia record in epic) pt extubated himself next day  . Liver cirrhosis secondary to NASH (nonalcoholic steatohepatitis) ([place])    NAFLD-- followed by dr Laural Golden---  liver bx 09-30-2012  mild portal and focal sinusoidal fibrosis  . Scapholunate advanced collapse of left wrist    deformity  . Thrombocytopenia ([place])    10-09-2017 per pt his platelets have always been low , followed by pcp, never been referred to hematologist    Past Surgical History:  Procedure Laterality Date  . APPLICATION OF WOUND VAC Right 02/22/2016   Procedure: APPLICATION OF WOUND VAC;  Surgeon:  Vickey Huger, MD;  Location: [place];  Service: Orthopedics;  Laterality: Right;  . CARDIAC CATHETERIZATION N/A 02/24/2016   Procedure: Left Heart Cath and Coronary Angiography;  Surgeon: Burnell Blanks, MD;  Location: [place] [facility];  Service: Cardiovascular;  Laterality: N/A;  No angiographic evidence of CAD,  normal LVSF, ef 50-55%  . EAR CYST EXCISION Right 09/06/2014   Procedure: OPEN EXCISION BAKER'S CYST RIGHT KNEE;  Surgeon: Vickey Huger, MD;  Location: [place];  Service: Orthopedics;  Laterality: Right;  . ESOPHAGEAL DILATION  1996/ 2000  . ESOPHAGOGASTRODUODENOSCOPY N/A 09/17/2012   Procedure: ESOPHAGOGASTRODUODENOSCOPY (EGD);  Surgeon: Rogene Houston, MD;  [facility];  Service: Endoscopy;  Laterality: N/A;  200  . ESOPHAGOGASTRODUODENOSCOPY N/A 12/15/2015   Procedure: ESOPHAGOGASTRODUODENOSCOPY (EGD);  Surgeon: Rogene Houston, MD;  [facility];  Service: Endoscopy;  Laterality: N/A;  1245  . ESOPHAGOGASTRODUODENOSCOPY N/A 12/17/2016   Procedure: ESOPHAGOGASTRODUODENOSCOPY (EGD);  Surgeon: Rogene Houston, MD;  [facility];  Service: Endoscopy;  Laterality: N/A;  210  . I&D KNEE WITH POLY EXCHANGE Right 04/02/2016   Procedure: IRRIGATION AND DEBRIDEMENT RIGHT KNEE WITH POLY EXCHANGE;  Surgeon: Vickey Huger, MD;  Location: [place];  Service: Orthopedics;  Laterality: Right;  . INCISION AND DRAINAGE HEMATOMA POST LEFT  TOTAL KNEE  2012  . INGUINAL HERNIA REPAIR Bilateral 05/22/2013   Procedure: REPAIR OF RECURRENT INCARCERATED INGUINAL HERNIA WITH MESH RIGHT SIDE,  REPAIR OF  RECURRENT INGUINAL HERNIA WITH MESH LEFT SIDE;  Surgeon: [name], MD;  Location: [place];  Service: General;  Laterality: Bilateral;  . INGUINAL HERNIA REPAIR Bilateral 11/1997  . INSERTION OF MESH Bilateral 05/22/2013   Procedure: INSERTION OF MESH;  Surgeon: [name], MD;  Location: [place];  Service: General;  Laterality: Bilateral;  . IRRIGATION AND DEBRIDEMENT KNEE  Right 02/22/2016   Procedure: IRRIGATION AND DEBRIDEMENT KNEE;  Surgeon: Vickey Huger, MD;  Location: [place];  Service: Orthopedics;  Laterality: Right;  . Irrigation and Debridement right knee  03/23/2009   Dr. Sabra Heck, [facility]  . KNEE ARTHROSCOPY W/ SYNOVECTOMY Right 01/30/2010   AND DEBRIDEMENT OF HETEROTOPIC  . LIVER BIOPSY  09/30/2012  . REVISION TOTAL KNEE ARTHROPLASTY Left fall 2012  . TONSILLECTOMY  child  . TOTAL KNEE ARTHROPLASTY Bilateral right 09/ 2010/  left 09-18-2010  . TRANSTHORACIC ECHOCARDIOGRAM  02/22/2016   ef 30-35% (per cardiac cath 02-24-2016 normal), severe hypokinesis of the mid-apicalanteroseptal and apical myocardium,  grade 1 diastolic dysfunction/ trivial PR and TR    Family History  Problem Relation Age of Onset  . Diabetes Mother        type 2, born 68  . Arthritis Father        died age 58  . GI Bleed Father        upper GI Bleed, non-ETOH cirrhosis  . Arthritis Brother   . Diabetes Brother        type 2  . Diabetes Paternal Uncle        type 2  . Heart attack Maternal Grandmother   . Heart attack Maternal Grandfather   . Colon cancer Neg Hx   . Colon polyps Neg Hx    Social History:  reports that he quit smoking about 43 years ago. His smoking use included cigarettes. He quit after 3.00 years of use. He has never used smokeless tobacco. He reports that he drank alcohol. He reports that he does not use drugs.  Allergies:  Allergies  Allergen Reactions  . Asa [Aspirin] Shortness Of Breath    "asthma symptoms"  . Penicillins Shortness Of Breath    Has patient had a PCN reaction causing immediate rash, facial/tongue/throat swelling, SOB or lightheadedness with hypotension: Yes Has patient had a PCN reaction causing severe rash involving mucus membranes or skin necrosis: No Has patient had a PCN reaction that required hospitalization No Has patient had a PCN reaction occurring within the last 10 years: No If all of the above answers are "NO", then  may proceed with Cephalosporin use. "asthma symptoms"  . Vancomycin Anaphylaxis    Immediately following being turned into prone position and Vancomycin administration in the OR patient cardiac arrest w/  Vfib.    Medications Prior to Admission  Medication Sig Dispense Refill  . pantoprazole (PROTONIX) 40 MG tablet Take 1 tablet (40 mg total) by mouth daily. (Patient taking differently: Take 40 mg by mouth every morning. ) 90 tablet 3    No results found for this or any previous visit (from the past 48 hour(s)). No results found.  Review of Systems  All other systems reviewed and are negative.   Blood pressure 125/71, pulse 74, temperature 98.6 F (37 C), temperature source Oral, resp. rate 16, height 6' (1.829 m), weight 99.6 kg (219 lb 9.6 oz), SpO2 98 %. Physical Exam  Constitutional: He is oriented to person, place, and time. He appears well-developed and well-nourished.  HENT:  Head: Normocephalic  and atraumatic.  Cardiovascular: Normal rate.  Respiratory: Effort normal.  Musculoskeletal:       Left wrist: He exhibits decreased range of motion, tenderness, bony tenderness and swelling.  Left wrist dorsal pain, swelling, and loss of motion  Neurological: He is alert and oriented to person, place, and time.  Skin: Skin is warm.  Psychiatric: He has a normal mood and affect. His behavior is normal. Judgment and thought content normal.     Assessment/Plan Patient is a very pleasant 62 year old male with chronic left wrist pain and radiographic evidence of a scapholunate advanced collapse pattern arthritis.  We have discussed in great detail in the past the nature of his arthritic condition and treatment options.  We recommend and patient agrees with a left wrist proximal row carpectomy with capitate resurfacing arthroplasty and a posterior interosseous nerve neurectomy for chronic preoperative wrist pain.  Patient understands the risks and benefits and the 3-monthrecovery  including splinting and occupational therapy.  They wish to proceed.  MSchuyler Amor MD 10/17/2017, 1:21 PM

## 2017-10-17 NOTE — Progress Notes (Signed)
1305 Time out for Ax. Block with Dr Kalman Shan and Nurse. Versed 1 mg and Fentanyl 100 mcg given for sedation. 1323 block completed. Tolerated well.

## 2017-10-17 NOTE — Op Note (Signed)
Please see dictated report [phone]

## 2017-10-17 NOTE — Anesthesia Preprocedure Evaluation (Signed)
Anesthesia Evaluation  Patient identified by MRN, date of birth, ID band Patient awake    Reviewed: Allergy & Precautions, NPO status , Patient's Chart, lab work & pertinent test results  Airway Mallampati: II  TM Distance: >3 FB Neck ROM: Full    Dental no notable dental hx.    Pulmonary neg pulmonary ROS, former smoker,    Pulmonary exam normal breath sounds clear to auscultation       Cardiovascular negative cardio ROS Normal cardiovascular exam Rhythm:Regular Rate:Normal     Neuro/Psych negative neurological ROS  negative psych ROS   GI/Hepatic GERD  ,(+) Cirrhosis   Esophageal Varices    ,   Endo/Other  negative endocrine ROS  Renal/GU negative Renal ROS  negative genitourinary   Musculoskeletal negative musculoskeletal ROS (+)   Abdominal   Peds negative pediatric ROS (+)  Hematology thrombocytopenia   Anesthesia Other Findings   Reproductive/Obstetrics negative OB ROS                             Anesthesia Physical Anesthesia Plan  ASA: III  Anesthesia Plan: General   Post-op Pain Management:  Regional for Post-op pain   Induction: Intravenous  PONV Risk Score and Plan: 2 and Ondansetron, Dexamethasone and Treatment may vary due to age or medical condition  Airway Management Planned: LMA  Additional Equipment:   Intra-op Plan:   Post-operative Plan: Extubation in OR  Informed Consent: I have reviewed the patients History and Physical, chart, labs and discussed the procedure including the risks, benefits and alternatives for the proposed anesthesia with the patient or authorized representative who has indicated his/her understanding and acceptance.   Dental advisory given  Plan Discussed with: CRNA and Surgeon  Anesthesia Plan Comments:         Anesthesia Quick Evaluation

## 2017-10-17 NOTE — Anesthesia Procedure Notes (Signed)
Anesthesia Procedure Image    

## 2017-10-17 NOTE — Anesthesia Procedure Notes (Signed)
Anesthesia Regional Block: Axillary brachial plexus block   Pre-Anesthetic Checklist: ,, timeout performed, Correct Patient, Correct Site, Correct Laterality, Correct Procedure, Correct Position, site marked, Risks and benefits discussed,  Surgical consent,  Pre-op evaluation,  At surgeon's request and post-op pain management  Laterality: Left  Prep: chloraprep       Needles:  Injection technique: Single-shot  Needle Type: Echogenic Needle     Needle Length: 9cm      Additional Needles:   Procedures:,,,, ultrasound used (permanent image in chart),,,,  Narrative:  Start time: 10/17/2017 1:02 PM End time: 10/17/2017 1:14 PM Injection made incrementally with aspirations every 5 mL.  Performed by: Personally  Anesthesiologist: Myrtie Soman, MD  Additional Notes: Patient tolerated the procedure well without complications

## 2017-10-17 NOTE — Discharge Instructions (Signed)
Regional Anesthesia Blocks  1. Numbness or the inability to move the "blocked" extremity may last from 3-48 hours after placement. The length of time depends on the medication injected and your individual response to the medication. If the numbness is not going away after 48 hours, call your surgeon.  2. The extremity that is blocked will need to be protected until the numbness is gone and the  Strength has returned. Because you cannot feel it, you will need to take extra care to avoid injury. Because it may be weak, you may have difficulty moving it or using it. You may not know what position it is in without looking at it while the block is in effect.  3. For blocks in the legs and feet, returning to weight bearing and walking needs to be done carefully. You will need to wait until the numbness is entirely gone and the strength has returned. You should be able to move your leg and foot normally before you try and bear weight or walk. You will need someone to be with you when you first try to ensure you do not fall and possibly risk injury.  4. Bruising and tenderness at the needle site are common side effects and will resolve in a few days.  5. Persistent numbness or new problems with movement should be communicated to the surgeon .  Post Anesthesia Home Care Instructions  Activity: Get plenty of rest for the remainder of the day. A responsible individual must stay with you for 24 hours following the procedure.  For the next 24 hours, DO NOT: -Drive a car -Paediatric nurse -Drink alcoholic beverages -Take any medication unless instructed by your physician -Make any legal decisions or sign important papers.  Meals: Start with liquid foods such as gelatin or soup. Progress to regular foods as tolerated. Avoid greasy, spicy, heavy foods. If nausea and/or vomiting occur, drink only clear liquids until the nausea and/or vomiting subsides. Call your physician if vomiting continues.  Special  Instructions/Symptoms: Your throat may feel dry or sore from the anesthesia or the breathing tube placed in your throat during surgery. If this causes discomfort, gargle with warm salt water. The discomfort should disappear within 24 hours.  If you had a scopolamine patch placed behind your ear for the management of post- operative nausea and/or vomiting:  1. The medication in the patch is effective for 72 hours, after which it should be removed.  Wrap patch in a tissue and discard in the trash. Wash hands thoroughly with soap and water. 2. You may remove the patch earlier than 72 hours if you experience unpleasant side effects which may include dry mouth, dizziness or visual disturbances. 3. Avoid touching the patch. Wash your hands with soap and water after contact with the patch.

## 2017-10-17 NOTE — Anesthesia Postprocedure Evaluation (Signed)
Anesthesia Post Note  Patient: Stephen Burch  Procedure(s) Performed: LEFT WRIST PROXIMAL ROW CARPECTOMY WITH POSTEROR IMBROSSEOUS NERVE EXCISION (Left Wrist) CAPITATE RESURFACING ARTHROPLASTY (Left Wrist)     Patient location during evaluation: PACU Anesthesia Type: General Level of consciousness: awake Pain management: pain level controlled Vital Signs Assessment: post-procedure vital signs reviewed and stable Respiratory status: spontaneous breathing Cardiovascular status: stable Anesthetic complications: no    Last Vitals:  Vitals:   10/17/17 1600 10/17/17 1615  BP: 117/70   Pulse: 65 66  Resp: 13 12  Temp:    SpO2: 99% 99%    Last Pain:  Vitals:   10/17/17 1615  TempSrc:   PainSc: 5                  [name]

## 2017-10-17 NOTE — Anesthesia Procedure Notes (Signed)
Procedure Name: LMA Insertion Date/Time: 10/17/2017 1:44 PM Performed by: Suan Halter, CRNA Pre-anesthesia Checklist: Patient identified, Emergency Drugs available, Suction available and Patient being monitored Patient Re-evaluated:Patient Re-evaluated prior to induction Oxygen Delivery Method: Circle system utilized Preoxygenation: Pre-oxygenation with 100% oxygen Induction Type: IV induction Ventilation: Mask ventilation without difficulty LMA: LMA inserted LMA Size: 4.0 Number of attempts: 1 Airway Equipment and Method: Bite block Placement Confirmation: positive ETCO2 Tube secured with: Tape Dental Injury: Teeth and Oropharynx as per pre-operative assessment

## 2017-10-18 NOTE — Op Note (Signed)
NAME:  Stephen Burch, Stephen Burch                     ACCOUNT NO.:  MEDICAL RECORD NO.:  [id number]  LOCATION:                                 FACILITY:  PHYSICIAN:  Ragan Duhon A. Burney Gauze, M.D.   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  Chronic and persistent left wrist pain.  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  Chronic and persistent left wrist pain.  PROCEDURE:  Left wrist proximal row carpectomy, posterior interosseous nerve neurectomy, and capitate resurfacing arthroplasty using the wrist motion Arthrosurface implant.  SURGEON:  Sheral Apley. Burney Gauze, M.D.  ASSISTANT:  Julian Reil, P.A.  ANESTHESIA:  Ax block and general.  No complication.  No drains.  No specimens.  DESCRIPTION OF PROCEDURE:  The patient was taken to the operating suite and after induction of adequate regional anesthetic and general laryngeal mask airway anesthetic, the left upper extremity was prepped and draped in the usual sterile fashion.  An Esmarch was used to exsanguinate the limb and the tourniquet was inflated to 250 mmHg.  At this point in time, a midline incision was made over the dorsal aspect of the left wrist.  The skin was incised sharply and dissection was carried down to the interval between the 2nd and 4th dorsal compartments.  The 3rd dorsal compartment was identified and the EPL tendon was carefully retracted.  The interval between the 2nd and 4th dorsal compartments was opened and a midline arthrotomy of the radiocarpal, midcarpal joint was performed between the 2nd and 4th dorsal compartments.  Dissection was carried down to the radiocarpal and midcarpal joints.  We subperiosteally dissected out the proximal and distal carpal rows.  We then  performed a proximal row carpectomy and removed the scaphoid, lunate, and triquetrum using a combination of curettes, osteotomes and rongeurs.  Once this was completed, a limited radial styloidectomy was performed directly under direct vision.  Next, after this was performed, we identified the posterior interosseous nerve in the proximal wound at the floor of the 4th compartment and carefully resected it.  We then performed a capitate resurfacing arthroplasty using the Arthrosurface implant under protocol.  We drilled and reamed the capitate head and placed a Sizer, which was deemed to be the appropriate size.  We then placed an Arthrosurface capitate resurfacing implant and fluoroscopic and direct imaging revealed adequate placement of the implant in the lunate fossa.  The wound was thoroughly irrigated and loosely closed in layers of 0 FiberWire to close the capsule, 4-0 Vicryl to reapproximate the extensor mechanism, and a 3-0 Prolene subcuticular stitch on the skin.  Steri-Strips, 4x4s, and a dorsal and volar splint were applied.  The patient tolerated these procedures well and went to recovery room in stable fashion.     Sheral Apley Burney Gauze, M.D.     MAW/MEDQ  D:  10/17/2017  T:  10/17/2017  Job:  [id number]

## 2017-10-22 ENCOUNTER — Encounter (HOSPITAL_BASED_OUTPATIENT_CLINIC_OR_DEPARTMENT_OTHER): Payer: Self-pay | Admitting: Orthopedic Surgery

## 2017-10-22 DIAGNOSIS — M19132 Post-traumatic osteoarthritis, left wrist: Secondary | ICD-10-CM | POA: Diagnosis not present

## 2017-10-22 DIAGNOSIS — M7989 Other specified soft tissue disorders: Secondary | ICD-10-CM | POA: Diagnosis not present

## 2017-10-22 DIAGNOSIS — M25532 Pain in left wrist: Secondary | ICD-10-CM | POA: Diagnosis not present

## 2017-11-12 DIAGNOSIS — G8929 Other chronic pain: Secondary | ICD-10-CM | POA: Diagnosis not present

## 2017-11-12 DIAGNOSIS — M19132 Post-traumatic osteoarthritis, left wrist: Secondary | ICD-10-CM | POA: Diagnosis not present

## 2017-11-12 DIAGNOSIS — M25532 Pain in left wrist: Secondary | ICD-10-CM | POA: Diagnosis not present

## 2017-12-09 DIAGNOSIS — M25632 Stiffness of left wrist, not elsewhere classified: Secondary | ICD-10-CM | POA: Diagnosis not present

## 2017-12-09 DIAGNOSIS — M25532 Pain in left wrist: Secondary | ICD-10-CM | POA: Diagnosis not present

## 2017-12-09 DIAGNOSIS — M7989 Other specified soft tissue disorders: Secondary | ICD-10-CM | POA: Diagnosis not present

## 2017-12-09 DIAGNOSIS — M19132 Post-traumatic osteoarthritis, left wrist: Secondary | ICD-10-CM | POA: Diagnosis not present

## 2017-12-09 DIAGNOSIS — M25642 Stiffness of left hand, not elsewhere classified: Secondary | ICD-10-CM | POA: Diagnosis not present

## 2017-12-17 DIAGNOSIS — M7989 Other specified soft tissue disorders: Secondary | ICD-10-CM | POA: Diagnosis not present

## 2017-12-17 DIAGNOSIS — G8929 Other chronic pain: Secondary | ICD-10-CM | POA: Diagnosis not present

## 2017-12-17 DIAGNOSIS — M25632 Stiffness of left wrist, not elsewhere classified: Secondary | ICD-10-CM | POA: Diagnosis not present

## 2017-12-17 DIAGNOSIS — M25532 Pain in left wrist: Secondary | ICD-10-CM | POA: Diagnosis not present

## 2017-12-17 DIAGNOSIS — M25642 Stiffness of left hand, not elsewhere classified: Secondary | ICD-10-CM | POA: Diagnosis not present

## 2017-12-17 DIAGNOSIS — M19132 Post-traumatic osteoarthritis, left wrist: Secondary | ICD-10-CM | POA: Diagnosis not present

## 2017-12-25 DIAGNOSIS — M7989 Other specified soft tissue disorders: Secondary | ICD-10-CM | POA: Diagnosis not present

## 2017-12-25 DIAGNOSIS — M25532 Pain in left wrist: Secondary | ICD-10-CM | POA: Diagnosis not present

## 2017-12-25 DIAGNOSIS — M19132 Post-traumatic osteoarthritis, left wrist: Secondary | ICD-10-CM | POA: Diagnosis not present

## 2017-12-25 DIAGNOSIS — M25632 Stiffness of left wrist, not elsewhere classified: Secondary | ICD-10-CM | POA: Diagnosis not present

## 2017-12-25 DIAGNOSIS — M25642 Stiffness of left hand, not elsewhere classified: Secondary | ICD-10-CM | POA: Diagnosis not present

## 2017-12-31 DIAGNOSIS — M19132 Post-traumatic osteoarthritis, left wrist: Secondary | ICD-10-CM | POA: Diagnosis not present

## 2018-01-03 ENCOUNTER — Encounter (INDEPENDENT_AMBULATORY_CARE_PROVIDER_SITE_OTHER): Payer: Self-pay | Admitting: *Deleted

## 2018-01-07 ENCOUNTER — Encounter (INDEPENDENT_AMBULATORY_CARE_PROVIDER_SITE_OTHER): Payer: Self-pay | Admitting: Internal Medicine

## 2018-01-07 ENCOUNTER — Ambulatory Visit (INDEPENDENT_AMBULATORY_CARE_PROVIDER_SITE_OTHER): Payer: PPO | Admitting: Internal Medicine

## 2018-01-07 ENCOUNTER — Encounter (INDEPENDENT_AMBULATORY_CARE_PROVIDER_SITE_OTHER): Payer: Self-pay | Admitting: *Deleted

## 2018-01-07 VITALS — BP 136/86 | Temp 98.5°F | Ht 72.0 in | Wt 219.8 lb

## 2018-01-07 DIAGNOSIS — K7460 Unspecified cirrhosis of liver: Secondary | ICD-10-CM | POA: Diagnosis not present

## 2018-01-07 DIAGNOSIS — K2270 Barrett's esophagus without dysplasia: Secondary | ICD-10-CM

## 2018-01-07 DIAGNOSIS — K219 Gastro-esophageal reflux disease without esophagitis: Secondary | ICD-10-CM | POA: Diagnosis not present

## 2018-01-07 MED ORDER — PANTOPRAZOLE SODIUM 40 MG PO TBEC: 40.0000 mg | DELAYED_RELEASE_TABLET | Freq: Every day | ORAL | 3 refills | Status: DC

## 2018-01-07 NOTE — Progress Notes (Signed)
Presenting complaint;  Follow-up for chronic liver disease and GERD.  Database and subjective:  Patient is 61 year old Caucasian male who is here for scheduled visit.  He underwent abdominal pelvic CT in December 2013 for abdominal pain and the study suggested subtle changes of cirrhosis splenomegaly and portal venous collaterals. Biochemical markers were negative.  Esophagogastroduodenoscopy in March 2014 revealed portal gastropathy [place] short segment Barrett's esophagus confirmed histologically.  He underwent a liver biopsy in April 2014 which revealed changes of steatohepatitis(macrovesicular fat) and peri-sinusoidal fibrosis but no frank changes of cirrhosis.   Elastography in October 2015 revealed stage F0/F1 disease and once again no changes of cirrhosis.  EGD in June 2017 revealed grade 1 esophageal varices, mild portal gastropathy as before short segment Barrett's esophagus.  EGD in June last year revealed grade 1 esophageal varices mild portal gastropathy and short segment Barrett's esophagus.  He has no complaints today.  He rarely has heartburn.  He is taking pantoprazole every other day and feels he is doing well.  He denies nausea vomiting dysphagia abdominal pain melena or rectal bleeding.  Bowels move daily.  He walks some daily. He had extensive surgery on his left wrist in April this year.  Says he had injury to his wrist because of his occupation.  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  He is presently working part-time.   Current Medications: Outpatient Encounter Medications as of 01/07/2018  Medication Sig  . pantoprazole (PROTONIX) 40 MG tablet Take 1 tablet (40 mg total) by mouth daily. (Patient taking differently: Take 40 mg by mouth every morning. )   No facility-administered encounter medications on file as of 01/07/2018.      Objective: Blood pressure 136/86, temperature 98.5 F (36.9 C), temperature source Oral, height 6' (1.829 m), weight 219  lb 12.8 oz (99.7 kg). Patient is alert and does not have asterixis. Conjunctiva is pink. Sclera is nonicteric Oropharyngeal mucosa is normal. No neck masses or thyromegaly noted. Cardiac exam with regular rhythm normal S1 and S2. No murmur or gallop noted. Lungs are clear to auscultation. Abdomen is full.  On palpation it is soft and nontender.  Liver and spleen cannot be palpated.  He has periumbilical scar as well as bilateral inguinal scars. No LE edema or clubbing noted. He has hyperpigmentation or vitiligo over his forehead back as well as hands.  Labs/studies Results: AFP 2.5 on 07/10/2017  CBC from 10/10/2017 WBC 2.9, H&H 15.1 and 44.7 and platelet count 60 8K.  Assessment:  #1.  Cirrhosis.  Etiology is felt to be NASH.  It is interesting to note that liver biopsy showed changes of steatohepatitis and fatty sinusoidal fibrosis but no no morphologic changes of cirrhosis.  Similarly elastography failed to show F3 or F4 disease.  However he has other compelling evidence of cirrhosis such as splenomegaly portal gastropathy small esophageal varices as well as leukopenia and thrombocytopenia. He has a history of 2 liver lesions initially discovered on ultrasound and subsequently evaluated with MR on 12/15/2014 and again in December 2016 and documented to have remained stable.  Will continue [place] screening with ultrasound.  #2.  GERD complicated by short segment Barrett's esophagus.  He will stay on PPI every other day but will not change prescription for now.  Plan:  Once again patient encouraged to increase physical activity as tolerated.  He may consider going to [facility] for water aerobics. Abdominal ultrasound for [place] screening. Patient will go to the lab for chemistry  panel and AFP. Office visit in 6 months. Will consider EGD next year.

## 2018-01-07 NOTE — Patient Instructions (Signed)
Physician will call with results of ultrasound and blood work when completed.

## 2018-01-08 LAB — COMPREHENSIVE METABOLIC PANEL
AG RATIO: 1.9 (calc) (ref 1.0–2.5)
ALBUMIN MSPROF: 4.4 g/dL (ref 3.6–5.1)
ALT: 28 U/L (ref 9–46)
AST: 26 U/L (ref 10–35)
Alkaline phosphatase (APISO): 112 U/L (ref 40–115)
BILIRUBIN TOTAL: 1.5 mg/dL — AB (ref 0.2–1.2)
BUN: 10 mg/dL (ref 7–25)
CALCIUM: 8.6 mg/dL (ref 8.6–10.3)
CO2: 27 mmol/L (ref 20–32)
Chloride: 108 mmol/L (ref 98–110)
Creat: 0.92 mg/dL (ref 0.70–1.25)
Globulin: 2.3 g/dL (calc) (ref 1.9–3.7)
Glucose, Bld: 127 mg/dL (ref 65–139)
Potassium: 3.8 mmol/L (ref 3.5–5.3)
SODIUM: 140 mmol/L (ref 135–146)
TOTAL PROTEIN: 6.7 g/dL (ref 6.1–8.1)

## 2018-01-08 LAB — AFP TUMOR MARKER: AFP TUMOR MARKER: 2.7 ng/mL (ref <6.1)

## 2018-01-14 ENCOUNTER — Ambulatory Visit (HOSPITAL_COMMUNITY)
Admission: RE | Admit: 2018-01-14 | Discharge: 2018-01-14 | Disposition: A | Discharge status: Home / Self Care | Payer: PPO | Source: Ambulatory Visit | Attending: Internal Medicine | Admitting: Internal Medicine

## 2018-01-14 DIAGNOSIS — K8020 Calculus of gallbladder without cholecystitis without obstruction: Secondary | ICD-10-CM | POA: Diagnosis not present

## 2018-01-14 DIAGNOSIS — K760 Fatty (change of) liver, not elsewhere classified: Secondary | ICD-10-CM | POA: Diagnosis not present

## 2018-01-14 DIAGNOSIS — R161 Splenomegaly, not elsewhere classified: Secondary | ICD-10-CM | POA: Diagnosis not present

## 2018-01-14 DIAGNOSIS — K7460 Unspecified cirrhosis of liver: Secondary | ICD-10-CM | POA: Diagnosis not present

## 2018-01-16 DIAGNOSIS — M25532 Pain in left wrist: Secondary | ICD-10-CM | POA: Diagnosis not present

## 2018-02-13 DIAGNOSIS — M19132 Post-traumatic osteoarthritis, left wrist: Secondary | ICD-10-CM | POA: Diagnosis not present

## 2018-02-13 DIAGNOSIS — G8929 Other chronic pain: Secondary | ICD-10-CM | POA: Diagnosis not present

## 2018-02-13 DIAGNOSIS — M25532 Pain in left wrist: Secondary | ICD-10-CM | POA: Diagnosis not present

## 2018-02-26 DIAGNOSIS — L80 Vitiligo: Secondary | ICD-10-CM | POA: Diagnosis not present

## 2018-02-26 DIAGNOSIS — L570 Actinic keratosis: Secondary | ICD-10-CM | POA: Diagnosis not present

## 2018-02-26 DIAGNOSIS — L819 Disorder of pigmentation, unspecified: Secondary | ICD-10-CM | POA: Diagnosis not present

## 2018-03-27 DIAGNOSIS — G8929 Other chronic pain: Secondary | ICD-10-CM | POA: Diagnosis not present

## 2018-03-27 DIAGNOSIS — M19132 Post-traumatic osteoarthritis, left wrist: Secondary | ICD-10-CM | POA: Diagnosis not present

## 2018-04-30 ENCOUNTER — Encounter: Payer: Self-pay | Admitting: Family Medicine

## 2018-04-30 ENCOUNTER — Ambulatory Visit (INDEPENDENT_AMBULATORY_CARE_PROVIDER_SITE_OTHER): Payer: PPO | Admitting: Family Medicine

## 2018-04-30 VITALS — BP 118/70 | HR 89 | Temp 98.6°F | Resp 16 | Ht 72.0 in | Wt 211.0 lb

## 2018-04-30 DIAGNOSIS — R739 Hyperglycemia, unspecified: Secondary | ICD-10-CM

## 2018-04-30 DIAGNOSIS — D696 Thrombocytopenia, unspecified: Secondary | ICD-10-CM

## 2018-04-30 DIAGNOSIS — Z0000 Encounter for general adult medical examination without abnormal findings: Secondary | ICD-10-CM

## 2018-04-30 DIAGNOSIS — K766 Portal hypertension: Secondary | ICD-10-CM | POA: Diagnosis not present

## 2018-04-30 DIAGNOSIS — Z125 Encounter for screening for malignant neoplasm of prostate: Secondary | ICD-10-CM

## 2018-04-30 DIAGNOSIS — I5021 Acute systolic (congestive) heart failure: Secondary | ICD-10-CM

## 2018-04-30 HISTORY — DX: Acute systolic (congestive) heart failure: I50.21

## 2018-04-30 NOTE — Patient Instructions (Addendum)
   The CDC recommends two doses of Shingrix (the shingles vaccine) separated by 2 to 6 months for adults age 64 years and older. I recommend checking with your insurance plan regarding coverage for this vaccine.   . I recommend that you get a flu vaccine this year. Please call our office at [phone] at your earliest convenience to schedule a flu shot.    You should take 2 (two) Pepto-bismol tablets before each meal when traveling

## 2018-04-30 NOTE — Progress Notes (Signed)
Patient: Stephen Burch, Male    DOB: Jun 09, 1956, 62 y.o.   MRN: [id number] Visit Date: 04/30/2018  Today's Provider: Mila Merry, MD   Chief Complaint  Patient presents with  . Annual Exam   Subjective:     Complete Physical Stephen Burch is a 61 y.o. male. He feels fairly well. He reports exercising daily. He reports he is sleeping fairly well.  -----------------------------------------------------------   Review of Systems  Constitutional: Negative for appetite change, chills, fatigue and fever.  HENT: Positive for dental problem. Negative for congestion, ear pain, hearing loss, nosebleeds and trouble swallowing.   Eyes: Negative for pain and visual disturbance.  Respiratory: Negative for cough, chest tightness and shortness of breath.   Cardiovascular: Negative for chest pain, palpitations and leg swelling.  Gastrointestinal: Negative for abdominal pain, blood in stool, constipation, diarrhea, nausea and vomiting.  Endocrine: Negative for polydipsia, polyphagia and polyuria.  Genitourinary: Negative for dysuria and flank pain.  Musculoskeletal: Negative for arthralgias, back pain, joint swelling, myalgias and neck stiffness.  Skin: Negative for color change, rash and wound.  Neurological: Negative for dizziness, tremors, seizures, speech difficulty, weakness, light-headedness and headaches.  Psychiatric/Behavioral: Negative for behavioral problems, confusion, decreased concentration, dysphoric mood and sleep disturbance. The patient is not nervous/anxious.   All other systems reviewed and are negative.   Social History   Socioeconomic History  . Marital status: Legally Separated    Spouse name: Not on file  . Number of children: 2  . Years of education: Not on file  . Highest education level: 12th grade  Occupational History  . Occupation: Actuary: [employer]    Comment: part time  Social Needs  . Financial resource strain: Not hard at  all  . Food insecurity:    Worry: Never true    Inability: Never true  . Transportation needs:    Medical: No    Non-medical: No  Tobacco Use  . Smoking status: Former Smoker    Years: 3.00    Types: Cigarettes    Last attempt to quit: 10/10/1974    Years since quitting: 43.5  . Smokeless tobacco: Never Used  Substance and Sexual Activity  . Alcohol use: Not Currently  . Drug use: No  . Sexual activity: Not on file  Lifestyle  . Physical activity:    Days per week: Not on file    Minutes per session: Not on file  . Stress: Not at all  Relationships  . Social connections:    Talks on phone: Not on file    Gets together: Not on file    Attends religious service: Not on file    Active member of club or organization: Not on file    Attends meetings of clubs or organizations: Not on file    Relationship status: Not on file  . Intimate partner violence:    Fear of current or ex partner: Not on file    Emotionally abused: Not on file    Physically abused: Not on file    Forced sexual activity: Not on file  Other Topics Concern  . Not on file  Social History Narrative   ** Merged History Encounter **       Pt lives alone.    Past Medical History:  Diagnosis Date  . Arthritis    knees, wrists  . Barrett's esophagus   . Complication of anesthesia    02-22-2016 intraop cardiac arrest  immediately following moving pt into prone position and vancomyocin administration , pt cardiac arrest w/ Vfib,  please refer to anesthesia record in epic  . Esophageal varices determined by endoscopy (HCC) 11/2016   grade 1  . GERD (gastroesophageal reflux disease)   . Hiatal hernia   . History of adenomatous polyp of colon   . History of asthma    as child  . History of DVT (deep vein thrombosis)    right lower extremitty post-op right total knee surgery 09/ 2010,  treated w/ coumadin  . History of esophageal dilatation 07/1998   for schatzski ring  . History of kidney stones   .  History of staph infection 04/2016   MSSA infection of right total knee w/ sepsis  . Hx of cardiac arrest    02-22-2016  intraoperative, immediately following moving pt into prone position and vancomyocin administration, cardiac arrest w/ Vfib (referred to anesthesia record in epic) pt extubated himself next day  . Liver cirrhosis secondary to NASH (nonalcoholic steatohepatitis) (HCC)    NAFLD-- followed by dr Karilyn Cota---  liver bx 09-30-2012  mild portal and focal sinusoidal fibrosis  . Scapholunate advanced collapse of left wrist    deformity  . Thrombocytopenia (HCC)    10-09-2017 per pt his platelets have always been low , followed by pcp, never been referred to hematologist     Patient Active Problem List   Diagnosis Date Noted  . NAFLD (nonalcoholic fatty liver disease) 16/04/9603  . Unsteadiness on feet   . Staphylococcus aureus bacteremia with sepsis (HCC)   . Infection of prosthetic right knee joint (HCC)   . Knee pain, acute   . Pyogenic arthritis of right knee joint (HCC) 04/02/2016  . Thrombocytopenia (HCC)   . Hypomagnesemia   . Cardiac arrest (HCC) 02/22/2016  . Encounter for central line placement   . Shock circulatory (HCC)   . Vitiligo 08/22/2015  . AD (atopic dermatitis) 08/22/2015  . Osteoarthritis of right knee 08/22/2015  . Cirrhosis of liver without ascites (HCC) 08/22/2015  . Hyperglycemia 08/22/2015  . GERD (gastroesophageal reflux disease) 08/22/2015  . Ganglion of left wrist 08/22/2015  . ED (erectile dysfunction) of organic origin 08/22/2015  . Asthma 08/22/2015  . Dysplastic nodule of liver 08/22/2015  . Baker cyst 09/06/2014  . H/O total knee replacement 08/05/2014  . Bilateral inguinal hernia 08/01/2012  . Splenomegaly 08/01/2012  . Portal hypertension (HCC) 08/01/2012  . Gallstones 08/01/2012    Past Surgical History:  Procedure Laterality Date  . APPLICATION OF WOUND VAC Right 02/22/2016   Procedure: APPLICATION OF WOUND VAC;  Surgeon: Dannielle Huh, MD;  Location: [facility] OR;  Service: Orthopedics;  Laterality: Right;  . CARDIAC CATHETERIZATION N/A 02/24/2016   Procedure: Left Heart Cath and Coronary Angiography;  Surgeon: Kathleene Hazel, MD;  Location: [facility] INVASIVE CV LAB;  Service: Cardiovascular;  Laterality: N/A;  No angiographic evidence of CAD,  normal LVSF, ef 50-55%  . CARPECTOMY Left 10/17/2017   Procedure: LEFT WRIST PROXIMAL ROW CARPECTOMY WITH POSTEROR IMBROSSEOUS NERVE EXCISION;  Surgeon: Dairl Ponder, MD;  Location: [facility];  Service: Orthopedics;  Laterality: Left;  AXILLARY BLOCK  . EAR CYST EXCISION Right 09/06/2014   Procedure: OPEN EXCISION BAKER'S CYST RIGHT KNEE;  Surgeon: Dannielle Huh, MD;  Location: [facility] OR;  Service: Orthopedics;  Laterality: Right;  . ESOPHAGEAL DILATION  1996/ 2000  . ESOPHAGOGASTRODUODENOSCOPY N/A 09/17/2012   Procedure: ESOPHAGOGASTRODUODENOSCOPY (EGD);  Surgeon: Malissa Hippo, MD;  Location: [facility]  ENDO SUITE;  Service: Endoscopy;  Laterality: N/A;  200  . ESOPHAGOGASTRODUODENOSCOPY N/A 12/15/2015   Procedure: ESOPHAGOGASTRODUODENOSCOPY (EGD);  Surgeon: Malissa Hippo, MD;  Location: [facility] ENDO SUITE;  Service: Endoscopy;  Laterality: N/A;  1245  . ESOPHAGOGASTRODUODENOSCOPY N/A 12/17/2016   Procedure: ESOPHAGOGASTRODUODENOSCOPY (EGD);  Surgeon: Malissa Hippo, MD;  Location: [facility] END[facility] SU[facility];  Service: Endoscopy;  Laterality: N/A;  210  . I&D KNEE WITH POLY EXCHANGE Right 04/02/2016   Procedure: IRRIGATION AND DEBRIDEMENT RIGHT KNEE WITH POLY EXCHANGE;  Surgeon: Dannielle Huh, MD;  Location: [facility] OR;  Service: Orthopedics;  Laterality: Right;  . INCISION AND DRAINAGE HEMATOMA POST LEFT  TOTAL KNEE  2012  . INGUINAL HERNIA REPAIR Bilateral 05/22/2013   Procedure: REPAIR OF RECURRENT INCARCERATED INGUINAL HERNIA WITH MESH RIGHT SIDE,  REPAIR OF RECURRENT INGUINAL HERNIA WITH MESH LEFT SIDE;  Surgeon: Ernestene Mention, MD;  Location: [facility] OR;  Service: General;  Laterality: Bilateral;  .  INGUINAL HERNIA REPAIR Bilateral 11/1997  . INSERTION OF MESH Bilateral 05/22/2013   Procedure: INSERTION OF MESH;  Surgeon: Ernestene Mention, MD;  Location: [facility] OR;  Service: General;  Laterality: Bilateral;  . IRRIGATION AND DEBRIDEMENT KNEE Right 02/22/2016   Procedure: IRRIGATION AND DEBRIDEMENT KNEE;  Surgeon: Dannielle Huh, MD;  Location: [facility] OR;  Service: Orthopedics;  Laterality: Right;  . Irrigation and Debridement right knee  03/23/2009   Dr. Hyacinth Meeker, [facility]  . KNEE ARTHROSCOPY W/ SYNOVECTOMY Right 01/30/2010   AND DEBRIDEMENT OF HETEROTOPIC  . LIVER BIOPSY  09/30/2012  . REVISION TOTAL KNEE ARTHROPLASTY Left fall 2012  . TONSILLECTOMY  child  . TOTAL KNEE ARTHROPLASTY Bilateral right 09/ 2010/  left 09-18-2010  . TRANSTHORACIC ECHOCARDIOGRAM  02/22/2016   ef 30-35% (per cardiac cath 02-24-2016 normal), severe hypokinesis of the mid-apicalanteroseptal and apical myocardium,  grade 1 diastolic dysfunction/ trivial PR and TR  . WRIST ARTHROPLASTY Left 10/17/2017   Procedure: CAPITATE RESURFACING ARTHROPLASTY;  Surgeon: Dairl Ponder, MD;  Location: [facility];  Service: Orthopedics;  Laterality: Left;    His family history includes Arthritis in his brother and father; Diabetes in his brother, mother, and paternal uncle; GI Bleed in his father; Heart attack in his maternal grandfather and maternal grandmother. There is no history of Colon cancer or Colon polyps.      Current Outpatient Medications:  .  pantoprazole (PROTONIX) 40 MG tablet, Take 1 tablet (40 mg total) by mouth daily., Disp: 90 tablet, Rfl: 3  Patient Care Team: Malva Limes, MD as PCP - General (Family Medicine) Malissa Hippo, MD as Consulting Physician (Gastroenterology) Dairl Ponder, MD as Consulting Physician (Orthopedic Surgery)     Objective:   Vitals: BP 118/70 (BP Location: Left Arm, Patient Position: Sitting, Cuff Size: Large)   Pulse 89   Temp 98.6 F (37 C) (Oral)   Resp 16    Ht 6' (1.829 m)   Wt 211 lb (95.7 kg)   SpO2 95% Comment: room air  BMI 28.62 kg/m   Physical Exam   General Appearance:    Alert, cooperative, no distress, appears stated age  Head:    Normocephalic, without obvious abnormality, atraumatic  Eyes:    PERRL, conjunctiva/corneas clear, EOM's intact, fundi    benign, both eyes       Ears:    Normal TM's and external ear canals, both ears  Nose:   Nares normal, septum midline, mucosa normal, no drainage   or sinus tenderness  Throat:  Lips, mucosa, and tongue normal; teeth and gums normal  Neck:   Supple, symmetrical, trachea midline, no adenopathy;       thyroid:  No enlargement/tenderness/nodules; no carotid   bruit or JVD  Back:     Symmetric, no curvature, ROM normal, no CVA tenderness  Lungs:     Clear to auscultation bilaterally, respirations unlabored  Chest wall:    No tenderness or deformity  Heart:    Regular rate and rhythm, S1 and S2 normal, no murmur, rub   or gallop  Abdomen:     Soft, non-tender, bowel sounds active all four quadrants,    no masses, no organomegaly  Genitalia:    deferred  Rectal:    deferred  Extremities:   Extremities normal, atraumatic, no cyanosis or edema  Pulses:   2+ and symmetric all extremities  Skin:   Skin color, texture, turgor normal, no rashes or lesions  Lymph nodes:   Cervical, supraclavicular, and axillary nodes normal  Neurologic:   CNII-XII intact. Normal strength, sensation and reflexes      throughout    Activities of Daily Living In your present state of health, do you have any difficulty performing the following activities: 10/17/2017 10/02/2017  Hearing? N N  Vision? N N  Difficulty concentrating or making decisions? N N  Walking or climbing stairs? N N  Dressing or bathing? N N  Doing errands, shopping? - Engineering geologist and eating ? - N  Using the Toilet? - N  In the past six months, have you accidently leaked urine? - N  Do you have problems with loss of bowel  control? - N  Managing your Medications? - N  Managing your Finances? - N  Housekeeping or managing your Housekeeping? - N  Some recent data might be hidden    Fall Risk Assessment Fall Risk  10/02/2017 06/18/2016 05/07/2016  Falls in the past year? No No No     Depression Screen PHQ 2/9 Scores 10/02/2017 10/02/2017 06/18/2016 05/07/2016  PHQ - 2 Score 0 0 0 0  PHQ- 9 Score 0 - - -       Assessment & Plan:    Annual Physical Reviewed patient's Family Medical History Reviewed and updated list of patient's medical providers Assessment of cognitive impairment was done Assessed patient's functional ability Established a written schedule for health screening services Health Risk Assessent Completed and Reviewed  Exercise Activities and Dietary recommendations Goals    . DIET - INCREASE WATER INTAKE     Recommend increasing water intake to 4-6 glasses a day.        There is no immunization history for the selected administration types on file for this patient.  Health Maintenance  Topic Date Due  . TETANUS/TDAP  03/03/1973  . COLONOSCOPY  03/03/2004  . INFLUENZA VACCINE  11/28/2018 (Originally 01/30/2018)  . Hepatitis C Screening  Completed  . HIV Screening  Completed     Discussed health benefits of physical activity, and encouraged him to engage in regular exercise appropriate for his age and condition.    ------------------------------------------------------------------------------------------------------------  1. Annual physical exam  - Lipid panel  2. Hyperglycemia  - Hemoglobin A1c  3. Prostate cancer screening  - PSA  4. Portal hypertension (HCC) Continue regular follow up with Dr. Karilyn Cota, next appointment scheduled for January 2020  5. Thrombocytopenia, unspecified (HCC) Stable. Followed by GI.   Reminded that he is due for colon cancer screening. He states he is planning on arranging  this at his next GI appointment  Recommended Shingrix and flu  vaccine which he refused.    Mila Merry, MD  [facility]

## 2018-05-01 DIAGNOSIS — Z0000 Encounter for general adult medical examination without abnormal findings: Secondary | ICD-10-CM | POA: Diagnosis not present

## 2018-05-01 DIAGNOSIS — R739 Hyperglycemia, unspecified: Secondary | ICD-10-CM | POA: Diagnosis not present

## 2018-05-01 DIAGNOSIS — Z125 Encounter for screening for malignant neoplasm of prostate: Secondary | ICD-10-CM | POA: Diagnosis not present

## 2018-05-02 ENCOUNTER — Other Ambulatory Visit: Payer: Self-pay | Admitting: Family Medicine

## 2018-05-02 ENCOUNTER — Encounter: Payer: Self-pay | Admitting: Family Medicine

## 2018-05-02 ENCOUNTER — Telehealth: Payer: Self-pay

## 2018-05-02 LAB — HEMOGLOBIN A1C
ESTIMATED AVERAGE GLUCOSE: 123 mg/dL
HEMOGLOBIN A1C: 5.9 % — AB (ref 4.8–5.6)

## 2018-05-02 LAB — LIPID PANEL
Chol/HDL Ratio: 2.9 ratio (ref 0.0–5.0)
Cholesterol, Total: 120 mg/dL (ref 100–199)
HDL: 41 mg/dL (ref >39)
LDL CALC: 59 mg/dL (ref 0–99)
Triglycerides: 100 mg/dL (ref 0–149)
VLDL Cholesterol Cal: 20 mg/dL (ref 5–40)

## 2018-05-02 LAB — PSA: Prostate Specific Ag, Serum: 4.6 ng/mL — ABNORMAL HIGH (ref 0.0–4.0)

## 2018-05-02 NOTE — Telephone Encounter (Signed)
Patient was advised and wants a call back around the 2 month mark to schedule recheck visit.

## 2018-05-02 NOTE — Telephone Encounter (Signed)
LMTCB 05/02/2018  Thanks,   -Mickel Baas

## 2018-05-02 NOTE — Telephone Encounter (Signed)
-----   Message from Birdie Sons, MD sent at 05/02/2018  8:01 AM EDT ----- PSA is mildly elevated. Need to recheck in about 2 months. Will contact him in January to recheck.  Rest of labs are normal.

## 2018-06-19 IMAGING — US US ABDOMEN LIMITED
1 series · 13 of 25 positions shown · non-contrast
Comparison: None.

ADDENDUM:
Comparison to MRI of the abdomen dated 06/17/2015 is made.

The 1.4 cm lesion demonstrated sonographically in the left lobe of
the liver was not present on the prior MRI. Therefore, the
recommendation for follow-up with MRI of the abdomen is still valid.
CLINICAL DATA: History of NASH cirrhosis, splenomegaly,
thrombocytopenia, nephrolithiasis and gallstones. Evaluate for
hepatoma.
EXAM:
US ABDOMEN LIMITED - RIGHT UPPER QUADRANT

[Series 1: us abdomen limited · 0.20mm/px · 13 of 50 slices shown]
[im 1/50]
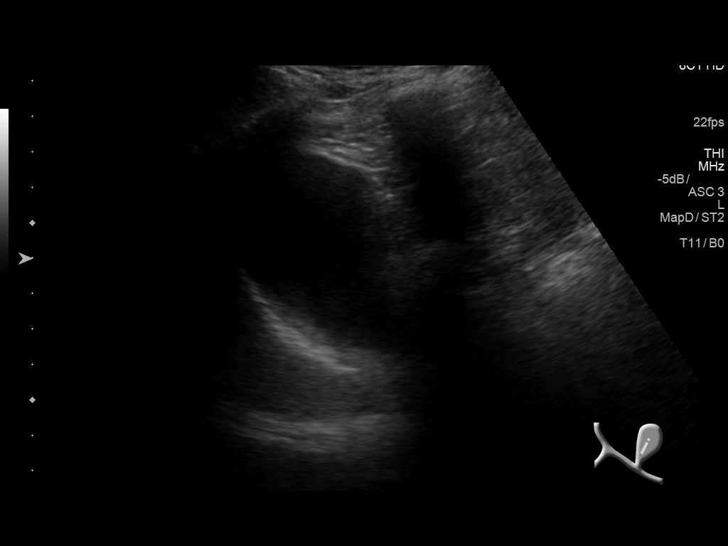
[im 5/50]
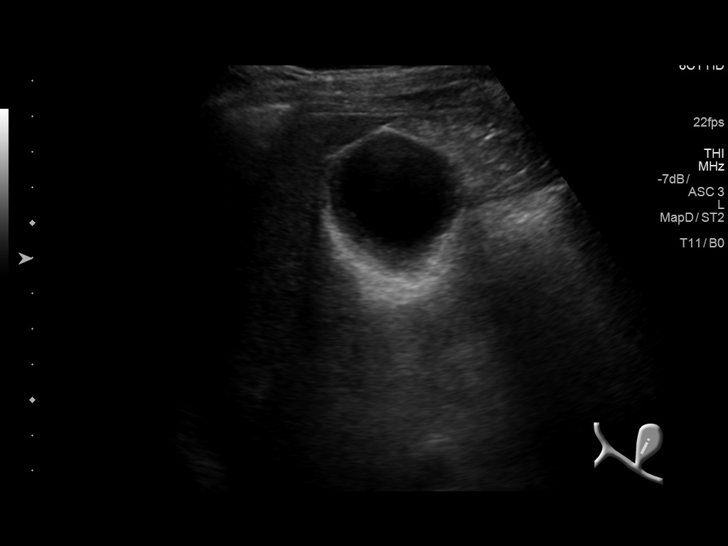
[im 9/50]
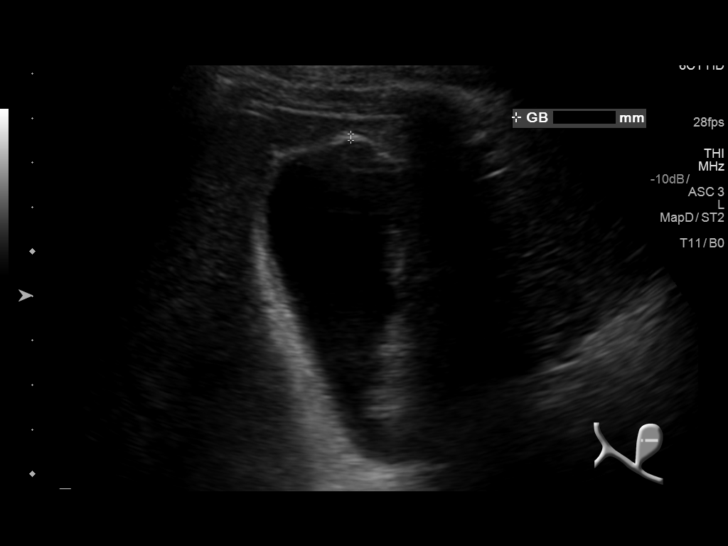
[im 13/50]
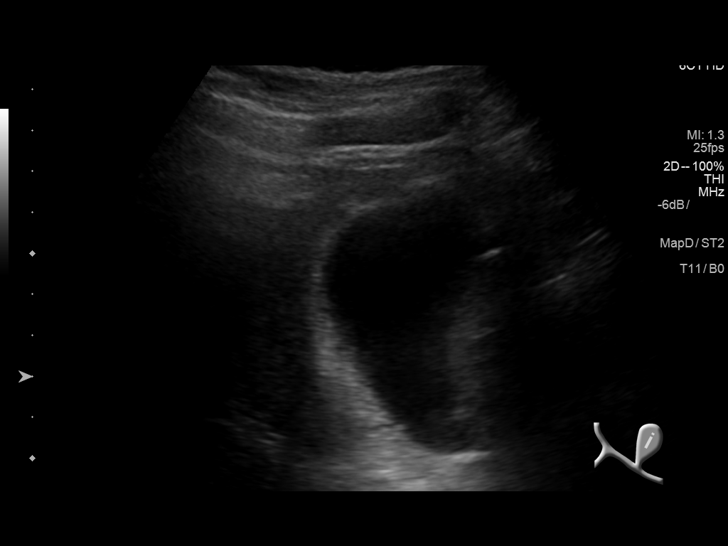
[im 17/50]
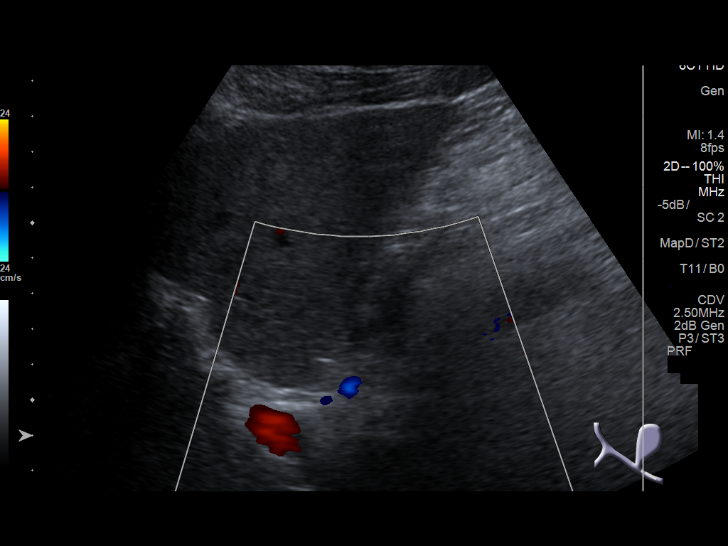
[im 21/50]
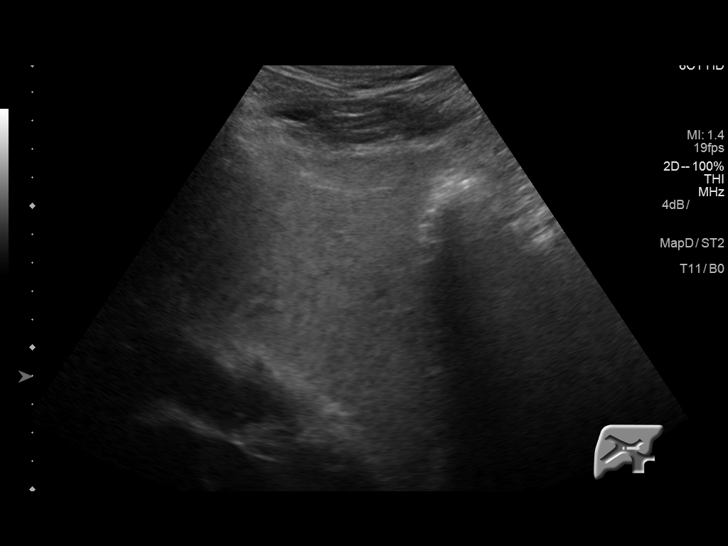
[im 25/50]
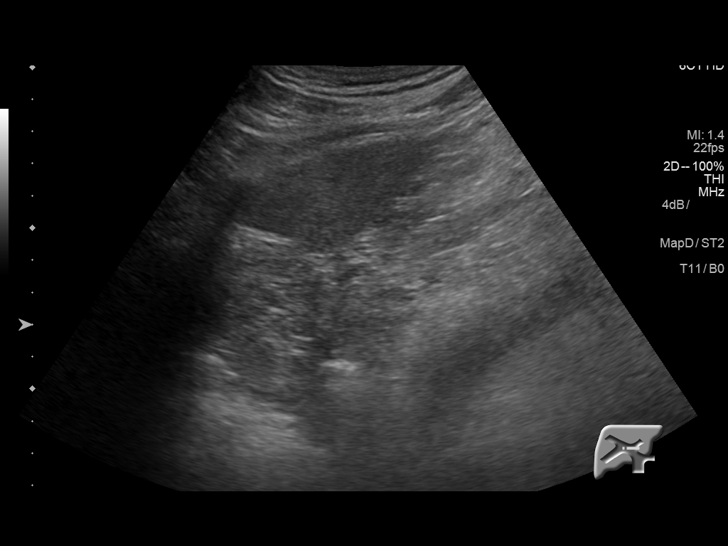
[im 29/50]
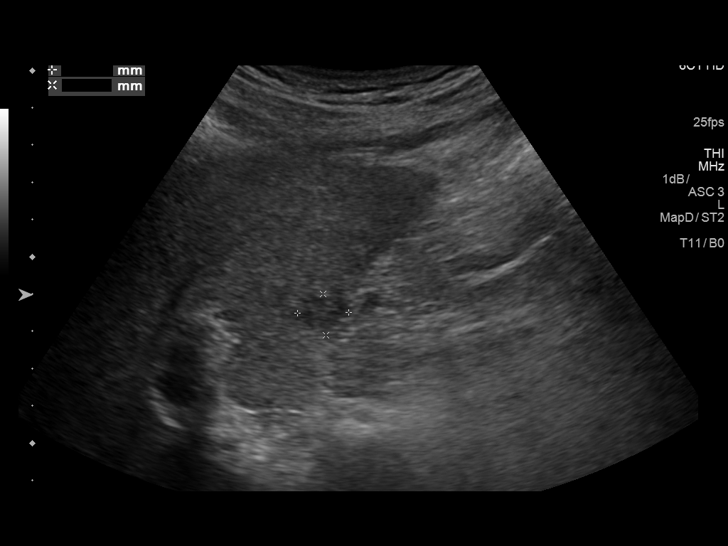
[im 33/50]
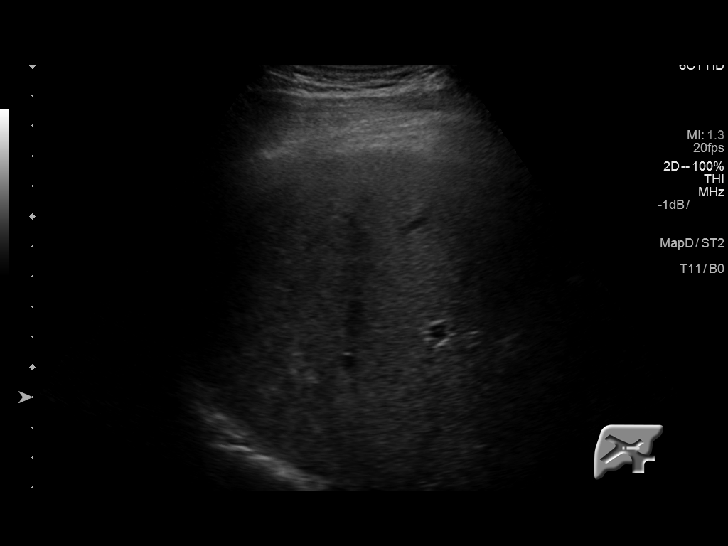
[im 37/50]
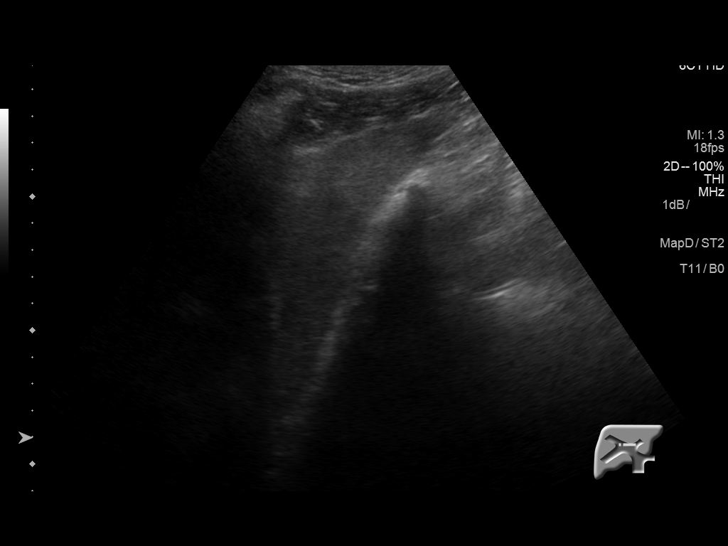
[im 41/50]
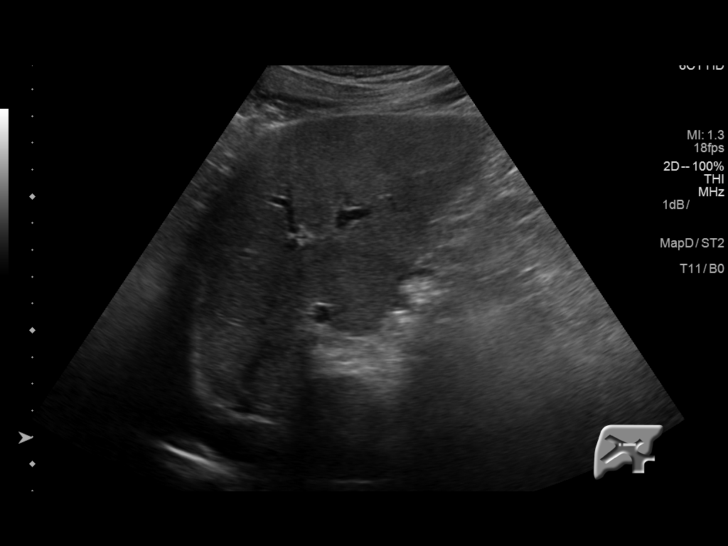
[im 45/50]
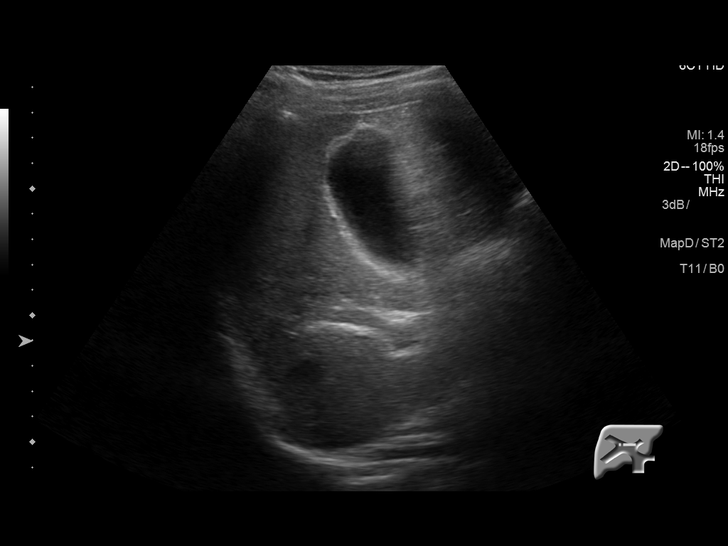
[im 50/50]
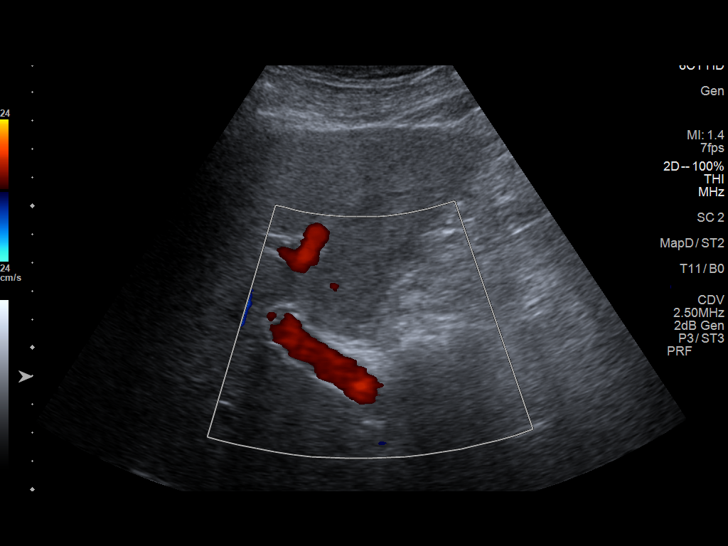

[13 of 25 positions shown; findings below may reference images not displayed]

FINDINGS: Gallbladder:

Sonographically normal. No gallbladder wall thickening or
pericholecystic fluid. No echogenic gallstones or biliary sludge.
Negative sonographic Murphy's sign.

Common bile duct:

Diameter: Normal in size measuring 2.6 mm in diameter

Liver:

There is diffuse increased slightly coarsened echogenicity of the
hepatic parenchyma. Suspected mild nodularity of the hepatic contour
(image 19). There is a fairly well-defined approximately 1.4 x 1.1 x
1.2 cm hypoechoic solid nodule within the left lobe of the liver
(representative images 27 through 32). No ascites.
IMPRESSION: 1. Findings suggestive of hepatic steatosis with suspected mild
cirrhotic change.
2. Indeterminate approximately 1.4 cm hypoechoic solid nodule within
the left lobe of the liver - comparison with prior outside
examinations (if available) is recommended. Otherwise, further
evaluation with abdominal MRI is recommended for lesion
characterization.
These results will be called to the ordering clinician or
representative by the Radiologist Assistant, and communication
documented in the PACS or zVision Dashboard.

## 2018-06-26 ENCOUNTER — Encounter (INDEPENDENT_AMBULATORY_CARE_PROVIDER_SITE_OTHER): Payer: Self-pay | Admitting: *Deleted

## 2018-06-26 DIAGNOSIS — M19132 Post-traumatic osteoarthritis, left wrist: Secondary | ICD-10-CM | POA: Diagnosis not present

## 2018-07-04 ENCOUNTER — Other Ambulatory Visit (INDEPENDENT_AMBULATORY_CARE_PROVIDER_SITE_OTHER): Payer: Self-pay | Admitting: *Deleted

## 2018-07-04 DIAGNOSIS — K7469 Other cirrhosis of liver: Secondary | ICD-10-CM

## 2018-07-09 ENCOUNTER — Ambulatory Visit (HOSPITAL_COMMUNITY)
Admission: RE | Admit: 2018-07-09 | Discharge: 2018-07-09 | Disposition: A | Discharge status: Home / Self Care | Payer: PPO | Source: Ambulatory Visit | Attending: Internal Medicine | Admitting: Internal Medicine

## 2018-07-09 DIAGNOSIS — K7469 Other cirrhosis of liver: Secondary | ICD-10-CM | POA: Diagnosis not present

## 2018-07-09 DIAGNOSIS — K7460 Unspecified cirrhosis of liver: Secondary | ICD-10-CM | POA: Diagnosis not present

## 2018-07-11 ENCOUNTER — Telehealth: Payer: Self-pay | Admitting: Family Medicine

## 2018-07-11 DIAGNOSIS — R972 Elevated prostate specific antigen [PSA]: Secondary | ICD-10-CM

## 2018-07-11 NOTE — Telephone Encounter (Signed)
Please advise pt time to recheck psa since it was elevated in October. Please print and leave order at lab.

## 2018-07-14 NOTE — Telephone Encounter (Signed)
Pt advised.  He says he will get it done this week.   Thanks,   -Mickel Baas

## 2018-07-15 ENCOUNTER — Encounter (INDEPENDENT_AMBULATORY_CARE_PROVIDER_SITE_OTHER): Payer: Self-pay | Admitting: Internal Medicine

## 2018-07-15 ENCOUNTER — Ambulatory Visit (INDEPENDENT_AMBULATORY_CARE_PROVIDER_SITE_OTHER): Payer: PPO | Admitting: Internal Medicine

## 2018-07-15 VITALS — BP 142/73 | HR 78 | Temp 98.2°F | Resp 18 | Ht 72.0 in | Wt 207.8 lb

## 2018-07-15 DIAGNOSIS — K7460 Unspecified cirrhosis of liver: Secondary | ICD-10-CM

## 2018-07-15 DIAGNOSIS — K2270 Barrett's esophagus without dysplasia: Secondary | ICD-10-CM | POA: Diagnosis not present

## 2018-07-15 DIAGNOSIS — K219 Gastro-esophageal reflux disease without esophagitis: Secondary | ICD-10-CM | POA: Diagnosis not present

## 2018-07-15 DIAGNOSIS — R972 Elevated prostate specific antigen [PSA]: Secondary | ICD-10-CM | POA: Diagnosis not present

## 2018-07-15 MED ORDER — PANTOPRAZOLE SODIUM 40 MG PO TBEC: 40.0000 mg | DELAYED_RELEASE_TABLET | Freq: Every day | ORAL | 3 refills | Status: DC

## 2018-07-15 NOTE — Progress Notes (Signed)
Presenting complaint;  Follow-up for nonalcoholic cirrhosis GERD and Barrett's esophagus.  Database and subjective:  Patient is 63 year old Caucasian male who was diagnosed with cirrhosis in December 2013 based on imaging studies also suggesting portal hypertension.  He also has GERD complicated by short segment Barrett's esophagus. He is here for scheduled visit.  He states he is not taking pantoprazole every day.  He rarely has heartburn or regurgitation.  She also denies dysphagia nausea or vomiting.  He is able to walk without any knee issues.  He has lost 12 pounds since his last visit.  His appetite is good but he is watching food intake.  Bowels move daily.  He denies melena rectal bleeding abdominal pain or pruritus.  He complains of excessive flatus.  He says at times he cannot control it and it is embarrassing.  He has had this problem for about 2 months.  No history of recent antibiotic use.   Current Medications: Outpatient Encounter Medications as of 07/15/2018  Medication Sig  . pantoprazole (PROTONIX) 40 MG tablet Take 1 tablet (40 mg total) by mouth daily.   No facility-administered encounter medications on file as of 07/15/2018.      Objective: Blood pressure (!) 142/73, pulse 78, temperature 98.2 F (36.8 C), temperature source Oral, resp. rate 18, height 6' (1.829 m), weight 207 lb 12.8 oz (94.3 kg). Patient is alert and in no acute distress. He does not have asterixis. Conjunctiva is pink. Sclera is nonicteric Oropharyngeal mucosa is normal. No neck masses or thyromegaly noted. Cardiac exam with regular rhythm normal S1 and S2. No murmur or gallop noted. Lungs are clear to auscultation. Abdomen is symmetrical.  He has periumbilical scar.  On palpation abdomen is soft and nontender without hepatosplenomegaly. No LE edema or clubbing noted. He has hypopigmented areas over his forehead back as well as back of both hands.  Labs/studies Results: Lab data from  01/07/2018 AFP 2.7. Bilirubin 1.5, AP 112, AST 26, ALT 28 and total protein 4.4. Serum creatinine 0.92.  Ultrasound from 07/09/2018 Echogenic liver due to fatty liver cirrhosis.  Splenomegaly has increased slightly since last exam.  Medical renal disease suspected in both kidneys.  Assessment:  #1.  Nonalcoholic cirrhosis.  He remains with well-preserved hepatic function.  He is up-to-date on hepatitis A and B vaccination.  Last EGD was in July 2018 revealing grade 1 esophageal varices and portal gastropathy.  We will consider EGD later this year.  Ultrasound does not show any suspicious areas in his liver.  AFP remains normal.  #2.  GERD complicated by short segment Barrett's esophagus.  Given that he has Barrett's esophagus he should take PPI every other day if not daily.  #3.  Flatulence.  He does not have alarm symptoms.  He has lost 12 pounds since his last visit but weight loss appears to be voluntary.  If he does not respond to symptomatic measures will consider empiric therapy with antibiotic as he may have SIBO.    Plan:  Phazyme 180 mg by mouth 3 times a day as needed. Patient will go to the lab for CBC, comprehensive chemistry panel and AFP. New prescription given for pantoprazole 40 mg by mouth daily before breakfast. Next ultrasound in 6 months. Office visit in 6 months at which time we will consider EGD to screen for varices.

## 2018-07-15 NOTE — Patient Instructions (Signed)
Physician will call with results of blood test when completed. Next ultrasound with elastography in July 2020

## 2018-07-16 ENCOUNTER — Telehealth: Payer: Self-pay

## 2018-07-16 DIAGNOSIS — K7460 Unspecified cirrhosis of liver: Secondary | ICD-10-CM | POA: Diagnosis not present

## 2018-07-16 LAB — FPSA% REFLEX
% FREE PSA: 25.8 %
PSA, FREE: 1.03 ng/mL

## 2018-07-16 LAB — PSA TOTAL (REFLEX TO FREE): PROSTATE SPECIFIC AG, SERUM: 4 ng/mL (ref 0.0–4.0)

## 2018-07-16 NOTE — Telephone Encounter (Signed)
LMTCB 07/16/2018   Thanks,   -Mickel Baas

## 2018-07-16 NOTE — Telephone Encounter (Signed)
-----   Message from Birdie Sons, MD sent at 07/16/2018  8:02 AM EST ----- PSA has gone back down to normal. Recheck at next CPE in October.

## 2018-07-17 LAB — COMPREHENSIVE METABOLIC PANEL
AG RATIO: 2 (calc) (ref 1.0–2.5)
ALT: 29 U/L (ref 9–46)
AST: 26 U/L (ref 10–35)
Albumin: 3.9 g/dL (ref 3.6–5.1)
Alkaline phosphatase (APISO): 162 U/L — ABNORMAL HIGH (ref 40–115)
BUN: 12 mg/dL (ref 7–25)
CO2: 27 mmol/L (ref 20–32)
Calcium: 8.7 mg/dL (ref 8.6–10.3)
Chloride: 108 mmol/L (ref 98–110)
Creat: 0.83 mg/dL (ref 0.70–1.25)
GLUCOSE: 147 mg/dL — AB (ref 65–139)
Globulin: 2 g/dL (calc) (ref 1.9–3.7)
Potassium: 3.9 mmol/L (ref 3.5–5.3)
Sodium: 141 mmol/L (ref 135–146)
Total Bilirubin: 1.6 mg/dL — ABNORMAL HIGH (ref 0.2–1.2)
Total Protein: 5.9 g/dL — ABNORMAL LOW (ref 6.1–8.1)

## 2018-07-17 LAB — CBC
HCT: 40.8 % (ref 38.5–50.0)
HEMOGLOBIN: 14.1 g/dL (ref 13.2–17.1)
MCH: 28.7 pg (ref 27.0–33.0)
MCHC: 34.6 g/dL (ref 32.0–36.0)
MCV: 83.1 fL (ref 80.0–100.0)
MPV: 11.5 fL (ref 7.5–12.5)
PLATELETS: 94 10*3/uL — AB (ref 140–400)
RBC: 4.91 10*6/uL (ref 4.20–5.80)
RDW: 13.3 % (ref 11.0–15.0)
WBC: 2 10*3/uL — ABNORMAL LOW (ref 3.8–10.8)

## 2018-07-17 LAB — AFP TUMOR MARKER: AFP TUMOR MARKER: 2.4 ng/mL (ref <6.1)

## 2018-07-18 NOTE — Telephone Encounter (Signed)
LMTCB 07/18/2018   Thanks,   -Mickel Baas

## 2018-07-18 NOTE — Telephone Encounter (Signed)
Pt returned missed call.  Please call pt back. ° °Thanks, °TGH °

## 2018-07-18 NOTE — Telephone Encounter (Signed)
Advised patient of results.  

## 2018-07-21 ENCOUNTER — Other Ambulatory Visit (INDEPENDENT_AMBULATORY_CARE_PROVIDER_SITE_OTHER): Payer: Self-pay | Admitting: *Deleted

## 2018-07-21 DIAGNOSIS — K7460 Unspecified cirrhosis of liver: Secondary | ICD-10-CM

## 2018-07-21 DIAGNOSIS — R739 Hyperglycemia, unspecified: Secondary | ICD-10-CM

## 2018-07-21 DIAGNOSIS — K760 Fatty (change of) liver, not elsewhere classified: Secondary | ICD-10-CM

## 2018-07-22 DIAGNOSIS — R739 Hyperglycemia, unspecified: Secondary | ICD-10-CM | POA: Diagnosis not present

## 2018-07-23 LAB — GLUCOSE, RANDOM: Glucose, Bld: 158 mg/dL — ABNORMAL HIGH (ref 65–99)

## 2018-07-23 LAB — HEMOGLOBIN A1C
EAG (MMOL/L): 7.3 (calc)
HEMOGLOBIN A1C: 6.2 %{Hb} — AB (ref <5.7)
Mean Plasma Glucose: 131 (calc)

## 2018-08-21 ENCOUNTER — Ambulatory Visit (INDEPENDENT_AMBULATORY_CARE_PROVIDER_SITE_OTHER): Payer: PPO | Admitting: Family Medicine

## 2018-08-21 ENCOUNTER — Encounter: Payer: Self-pay | Admitting: Family Medicine

## 2018-08-21 VITALS — BP 130/80 | HR 80 | Temp 98.0°F | Wt 208.0 lb

## 2018-08-21 DIAGNOSIS — J069 Acute upper respiratory infection, unspecified: Secondary | ICD-10-CM | POA: Diagnosis not present

## 2018-08-21 MED ORDER — FLUTICASONE PROPIONATE 50 MCG/ACT NA SUSP: 2.0000 | Freq: Every day | NASAL | 1 refills | Status: DC

## 2018-08-21 MED ORDER — AZITHROMYCIN 250 MG PO TABS: ORAL_TABLET | ORAL | 0 refills | Status: AC

## 2018-08-21 NOTE — Patient Instructions (Addendum)
. Please review the attached list of medications and notify my office if there are any errors.   . Try OTC Zinc Lozenges (such as ColdEEZE) to help get over viral infection faster   Fill antibiotic prescription if you develop any fever over 101, shortness of breath, dark green nasal drainage, sinus pain or if not feeling better in 4-5 days.    Upper Respiratory Infection, Adult An upper respiratory infection (URI) affects the nose, throat, and upper air passages. URIs are caused by germs (viruses). The most common type of URI is often called "the common cold." Medicines cannot cure URIs, but you can do things at home to relieve your symptoms. URIs usually get better within 7-10 days. Follow these instructions at home: Activity  Rest as needed.  If you have a fever, stay home from work or school until your fever is gone, or until your doctor says you may return to work or school. ? You should stay home until you cannot spread the infection anymore (you are not contagious). ? Your doctor may have you wear a face mask so you have less risk of spreading the infection. Relieving symptoms  Gargle with a salt-water mixture 3-4 times a day or as needed. To make a salt-water mixture, completely dissolve -1 tsp of salt in 1 cup of warm water.  Use a cool-mist humidifier to add moisture to the air. This can help you breathe more easily. Eating and drinking   Drink enough fluid to keep your pee (urine) pale yellow.  Eat soups and other clear broths. General instructions   Take over-the-counter and prescription medicines only as told by your doctor. These include cold medicines, fever reducers, and cough suppressants.  Do not use any products that contain nicotine or tobacco. These include cigarettes and e-cigarettes. If you need help quitting, ask your doctor.  Avoid being where people are smoking (avoid secondhand smoke).  Make sure you get regular shots and get the flu shot every  year.  Keep all follow-up visits as told by your doctor. This is important. How to avoid spreading infection to others   Wash your hands often with soap and water. If you do not have soap and water, use hand sanitizer.  Avoid touching your mouth, face, eyes, or nose.  Cough or sneeze into a tissue or your sleeve or elbow. Do not cough or sneeze into your hand or into the air. Contact a doctor if:  You are getting worse, not better.  You have any of these: ? A fever. ? Chills. ? Brown or red mucus in your nose. ? Yellow or brown fluid (discharge)coming from your nose. ? Pain in your face, especially when you bend forward. ? Swollen neck glands. ? Pain with swallowing. ? White areas in the back of your throat. Get help right away if:  You have shortness of breath that gets worse.  You have very bad or constant: ? Headache. ? Ear pain. ? Pain in your forehead, behind your eyes, and over your cheekbones (sinus pain). ? Chest pain.  You have long-lasting (chronic) lung disease along with any of these: ? Wheezing. ? Long-lasting cough. ? Coughing up blood. ? A change in your usual mucus.  You have a stiff neck.  You have changes in your: ? Vision. ? Hearing. ? Thinking. ? Mood. Summary  An upper respiratory infection (URI) is caused by a germ called a virus. The most common type of URI is often called "the common cold."  URIs usually get better within 7-10 days.  Take over-the-counter and prescription medicines only as told by your doctor. This information is not intended to replace advice given to you by your health care provider. Make sure you discuss any questions you have with your health care provider. Document Released: 12/05/2007 Document Revised: 02/08/2017 Document Reviewed: 02/08/2017 Elsevier Interactive Patient Education  2019 [employer].

## 2018-08-21 NOTE — Progress Notes (Signed)
Patient: Stephen Burch Male    DOB: 26-May-1956   63 y.o.   MRN: [id number] Visit Date: 08/21/2018  Today's Provider: Mila Merry, MD   Chief Complaint  Patient presents with  . URI    Started about 2-3 days ago.   Subjective:     URI   This is a new problem. The current episode started in the past 7 days. There has been no fever. Associated symptoms include congestion, a plugged ear sensation, rhinorrhea, sinus pain and a sore throat. Pertinent negatives include no abdominal pain, coughing, diarrhea, ear pain, headaches, nausea, neck pain, sneezing, swollen glands or wheezing. He has tried increased fluids for the symptoms. The treatment provided no relief.  no cough or chest congestion  Allergies  Allergen Reactions  . Asa [Aspirin] Shortness Of Breath    "asthma symptoms"  . Penicillins Shortness Of Breath    Has patient had a PCN reaction causing immediate rash, facial/tongue/throat swelling, SOB or lightheadedness with hypotension: Yes Has patient had a PCN reaction causing severe rash involving mucus membranes or skin necrosis: No Has patient had a PCN reaction that required hospitalization No Has patient had a PCN reaction occurring within the last 10 years: No If all of the above answers are "NO", then may proceed with Cephalosporin use. "asthma symptoms"  . Vancomycin Anaphylaxis    Immediately following being turned into prone position and Vancomycin administration in the OR patient cardiac arrest w/  Vfib.     Current Outpatient Medications:  .  pantoprazole (PROTONIX) 40 MG tablet, Take 1 tablet (40 mg total) by mouth daily., Disp: 90 tablet, Rfl: 3  Review of Systems  Constitutional: Positive for fatigue. Negative for activity change, appetite change, chills, diaphoresis, fever and unexpected weight change.  HENT: Positive for congestion, postnasal drip, rhinorrhea, sinus pressure, sinus pain, sore throat, tinnitus and voice change. Negative for ear  discharge, ear pain, sneezing and trouble swallowing.   Eyes: Positive for discharge and itching. Negative for photophobia, pain, redness and visual disturbance.  Respiratory: Negative for apnea, cough, choking, chest tightness, shortness of breath, wheezing and stridor.   Gastrointestinal: Negative.  Negative for abdominal pain, diarrhea and nausea.  Musculoskeletal: Negative for neck pain and neck stiffness.  Neurological: Negative for dizziness, light-headedness and headaches.    Social History   Tobacco Use  . Smoking status: Former Smoker    Years: 3.00    Types: Cigarettes    Last attempt to quit: 10/10/1974    Years since quitting: 43.8  . Smokeless tobacco: Never Used  Substance Use Topics  . Alcohol use: Not Currently      Objective:   BP 130/80 (BP Location: Right Arm, Patient Position: Sitting, Cuff Size: Normal)   Pulse 80   Temp 98 F (36.7 C) (Oral)   Wt 208 lb (94.3 kg)   BMI 28.21 kg/m  Vitals:   08/21/18 0850  BP: 130/80  Pulse: 80  Temp: 98 F (36.7 C)  TempSrc: Oral  Weight: 208 lb (94.3 kg)     Physical Exam  General Appearance:    Alert, cooperative, no distress  HENT:   bilateral TM normal without fluid or infection, neck without nodes, pharynx erythematous without exudate, post nasal drip noted and nasal mucosa congested  Eyes:    PERRL, conjunctiva/corneas clear, EOM's intact       Lungs:     Clear to auscultation bilaterally, respirations unlabored  Heart:    Regular rate  and rhythm  Neurologic:   Awake, alert, oriented x 3. No apparent focal neurological           defect.          Assessment & Plan    1. Upper respiratory tract infection, unspecified type     Mila Merry, MD  [facility]

## 2018-08-27 ENCOUNTER — Other Ambulatory Visit (INDEPENDENT_AMBULATORY_CARE_PROVIDER_SITE_OTHER): Payer: Self-pay | Admitting: *Deleted

## 2018-08-27 ENCOUNTER — Encounter (INDEPENDENT_AMBULATORY_CARE_PROVIDER_SITE_OTHER): Payer: Self-pay | Admitting: *Deleted

## 2018-08-27 DIAGNOSIS — K7460 Unspecified cirrhosis of liver: Secondary | ICD-10-CM

## 2018-08-27 DIAGNOSIS — K760 Fatty (change of) liver, not elsewhere classified: Secondary | ICD-10-CM

## 2018-08-27 NOTE — Progress Notes (Signed)
Other

## 2018-09-15 DIAGNOSIS — K7460 Unspecified cirrhosis of liver: Secondary | ICD-10-CM | POA: Diagnosis not present

## 2018-09-15 DIAGNOSIS — K760 Fatty (change of) liver, not elsewhere classified: Secondary | ICD-10-CM | POA: Diagnosis not present

## 2018-09-15 LAB — HEPATIC FUNCTION PANEL
AG Ratio: 1.9 (calc) (ref 1.0–2.5)
ALKALINE PHOSPHATASE (APISO): 157 U/L — AB (ref 35–144)
ALT: 26 U/L (ref 9–46)
AST: 26 U/L (ref 10–35)
Albumin: 4 g/dL (ref 3.6–5.1)
BILIRUBIN INDIRECT: 1.1 mg/dL (ref 0.2–1.2)
Bilirubin, Direct: 0.4 mg/dL — ABNORMAL HIGH (ref 0.0–0.2)
Globulin: 2.1 g/dL (calc) (ref 1.9–3.7)
Total Bilirubin: 1.5 mg/dL — ABNORMAL HIGH (ref 0.2–1.2)
Total Protein: 6.1 g/dL (ref 6.1–8.1)

## 2018-10-06 ENCOUNTER — Telehealth: Payer: Self-pay

## 2018-10-06 NOTE — Telephone Encounter (Signed)
Called pt to advise him of the change with AWV now been completed via virtual visit due to the COVID-19 restrictions. Pt declined the virtual visit and stated that he would like a CB in 01/2019 to r/s this apt.  -MM

## 2018-10-13 ENCOUNTER — Ambulatory Visit: Payer: Self-pay

## 2018-11-14 ENCOUNTER — Other Ambulatory Visit: Payer: Self-pay

## 2018-11-14 ENCOUNTER — Ambulatory Visit (INDEPENDENT_AMBULATORY_CARE_PROVIDER_SITE_OTHER): Payer: PPO | Admitting: Family Medicine

## 2018-11-14 ENCOUNTER — Encounter: Payer: Self-pay | Admitting: Family Medicine

## 2018-11-14 VITALS — BP 134/74 | HR 90 | Temp 99.0°F | Resp 16 | Ht 72.0 in | Wt 199.0 lb

## 2018-11-14 DIAGNOSIS — R739 Hyperglycemia, unspecified: Secondary | ICD-10-CM

## 2018-11-14 LAB — POCT GLYCOSYLATED HEMOGLOBIN (HGB A1C)
Est. average glucose Bld gHb Est-mCnc: 128
Hemoglobin A1C: 6.1 % — AB (ref 4.0–5.6)

## 2018-11-14 MED ORDER — GLIPIZIDE ER 2.5 MG PO TB24: 2.5000 mg | ORAL_TABLET | Freq: Every day | ORAL | 3 refills | Status: DC

## 2018-11-14 NOTE — Patient Instructions (Signed)
.   Please review the attached list of medications and notify my office if there are any errors.   . Please bring all of your medications to every appointment so we can make sure that our medication list is the same as yours.    Try cutting out dairy for two weeks to see if that helps your digestive track. If that doesn't;t work try cutting out gluten for two weeks

## 2018-11-14 NOTE — Progress Notes (Signed)
Patient: Stephen Burch Male    DOB: Jun 18, 1956   63 y.o.   MRN: [id number] Visit Date: 11/14/2018  Today's Provider: Mila Merry, MD   Chief Complaint  Patient presents with  . Hyperglycemia   Subjective:     HPI   Hyperglycemia, Follow-up:   Lab Results  Component Value Date   HGBA1C 6.1 (A) 11/14/2018   HGBA1C 6.2 (H) 07/22/2018   HGBA1C 5.9 (H) 05/01/2018   GLUCOSE 158 (H) 07/22/2018   GLUCOSE 147 (H) 07/16/2018   GLUCOSE 127 01/07/2018    Last seen for for this 6 months ago.  Management since then includes monitoring diet and increase exercise . Current symptoms include none and have been stable.  Weight trend: stable Prior visit with dietician: no Current diet: well balanced Current exercise: walking  Pertinent Labs:    Component Value Date/Time   CHOL 120 05/01/2018 0838   TRIG 100 05/01/2018 0838   CHOLHDL 2.9 05/01/2018 0838   CREATININE 0.83 07/16/2018 0820    Wt Readings from Last 3 Encounters:  11/14/18 199 lb (90.3 kg)  08/21/18 208 lb (94.3 kg)  07/15/18 207 lb 12.8 oz (94.3 kg)   Patient reports that he is averaging in the 170s for his blood sugar at home. Today it was 201 after eating breakfast. He has been more fatigued than usual and having polyuria, no urinary hesitancy or urgency.   Allergies  Allergen Reactions  . Asa [Aspirin] Shortness Of Breath    "asthma symptoms"  . Penicillins Shortness Of Breath    Has patient had a PCN reaction causing immediate rash, facial/tongue/throat swelling, SOB or lightheadedness with hypotension: Yes Has patient had a PCN reaction causing severe rash involving mucus membranes or skin necrosis: No Has patient had a PCN reaction that required hospitalization No Has patient had a PCN reaction occurring within the last 10 years: No If all of the above answers are "NO", then may proceed with Cephalosporin use. "asthma symptoms"  . Vancomycin Anaphylaxis    Immediately following being turned into  prone position and Vancomycin administration in the OR patient cardiac arrest w/  Vfib.     Current Outpatient Medications:  .  pantoprazole (PROTONIX) 40 MG tablet, Take 1 tablet (40 mg total) by mouth daily., Disp: 90 tablet, Rfl: 3 .  fluticasone (FLONASE) 50 MCG/ACT nasal spray, Place 2 sprays into both nostrils daily for 30 days., Disp: 16 g, Rfl: 1  Review of Systems  Constitutional: Negative for activity change, appetite change, chills, diaphoresis, fatigue, fever and unexpected weight change.  Respiratory: Negative for shortness of breath.   Cardiovascular: Negative for chest pain, palpitations and leg swelling.  Gastrointestinal: Negative for diarrhea, nausea, rectal pain and vomiting.  Endocrine: Negative for cold intolerance, heat intolerance, polydipsia, polyphagia and polyuria.  Genitourinary: Negative for difficulty urinating and frequency.  Allergic/Immunologic: Negative for environmental allergies.  Neurological: Negative for dizziness, tremors, syncope, weakness, light-headedness, numbness and headaches.  Psychiatric/Behavioral: Negative for self-injury, sleep disturbance and suicidal ideas. The patient is not nervous/anxious.     Social History   Tobacco Use  . Smoking status: Former Smoker    Years: 3.00    Types: Cigarettes    Last attempt to quit: 10/10/1974    Years since quitting: 44.1  . Smokeless tobacco: Never Used  Substance Use Topics  . Alcohol use: Not Currently      Objective:   BP 134/74 (BP Location: Left Arm, Patient Position: Sitting, Cuff Size:  Normal)   Pulse 90   Temp 99 F (37.2 C)   Resp 16   Ht 6' (1.829 m)   Wt 199 lb (90.3 kg)   SpO2 96%   BMI 26.99 kg/m  Vitals:   11/14/18 1451  BP: 134/74  Pulse: 90  Resp: 16  Temp: 99 F (37.2 C)  SpO2: 96%  Weight: 199 lb (90.3 kg)  Height: 6' (1.829 m)     Physical Exam   General Appearance:    Alert, cooperative, no distress  Eyes:    PERRL, conjunctiva/corneas clear, EOM's  intact       Lungs:     Clear to auscultation bilaterally, respirations unlabored  Heart:    Regular rate and rhythm  Neurologic:   Awake, alert, oriented x 3. No apparent focal neurological           defect.       Results for orders placed or performed in visit on 11/14/18  POCT glycosylated hemoglobin (Hb A1C)  Result Value Ref Range   Hemoglobin A1C 6.1 (A) 4.0 - 5.6 %   HbA1c POC (<> result, manual entry)     HbA1c, POC (prediabetic range)     HbA1c, POC (controlled diabetic range)     Est. average glucose Bld gHb Est-mCnc 128        Assessment & Plan    1. Hyperglycemia Not candidate for metformin due to liver disease, he is having some sx of hyperglycemia, although a1c is stable. Counseled on avoiding sweets, enriched starches, and to eat frequent small healthy meales. Try low dose of glipizide.  - POCT glycosylated hemoglobin (Hb A1C) - glipiZIDE (GLUCOTROL XL) 2.5 MG 24 hr tablet; Take 1 tablet (2.5 mg total) by mouth daily with breakfast.  Dispense: 30 tablet; Refill: 3     Mila Merry, MD  [facility]

## 2018-12-17 ENCOUNTER — Telehealth: Payer: Self-pay | Admitting: Family Medicine

## 2018-12-17 NOTE — Telephone Encounter (Signed)
I called patient back to get more information. Patient reports having diarrhea for the past 2.5 days. He has also have fever up to 100.0. He has tried taking Pepto Bismol and Tylenol. Patient wanted to know what you recommend, or what he should do for symptoms. Patient denies abdominal pain, body aches, headaches, shortness of breath,  nausea or vomiting Patient did not want to schedule a virtual visit. Please advise. Please send response to the late nurse.

## 2018-12-17 NOTE — Telephone Encounter (Signed)
Due to patient's comorbidities I think he needs an evisit or telephone visit for [place] to get more information from him. If he refuses he can take Imodium for the diarrhea. Push fluids. If worsening he may require ER visit for labs and imaging to make sure nothing else is going on.

## 2018-12-17 NOTE — Telephone Encounter (Signed)
Pt has had diarrhea and fever for several days.  He first thought it was something he has eat but it is still going on  Please advise [phone]  Thanks Con Memos

## 2018-12-18 ENCOUNTER — Ambulatory Visit (INDEPENDENT_AMBULATORY_CARE_PROVIDER_SITE_OTHER): Payer: PPO | Admitting: Physician Assistant

## 2018-12-18 DIAGNOSIS — K7460 Unspecified cirrhosis of liver: Secondary | ICD-10-CM

## 2018-12-18 DIAGNOSIS — R509 Fever, unspecified: Secondary | ICD-10-CM | POA: Diagnosis not present

## 2018-12-18 NOTE — Telephone Encounter (Signed)
Called and spoke with patient and scheduled a telephone appt. With Stephen Burch

## 2018-12-18 NOTE — Progress Notes (Signed)
Patient: Stephen Burch Male    DOB: [birth year]   63 y.o.   MRN: [id number] Visit Date: 12/18/2018  Today's Provider: Trey Sailors, PA-C   Chief Complaint  Patient presents with  . Diarrhea   Subjective:    I, Porsha McClurkin CMA, am acting as a Neurosurgeon for [place].   Virtual Visit via Telephone Note  I connected with [name] on 12/18/18 at  3:40 PM EDT by telephone and verified that I am speaking with the correct person using two identifiers.  Location: Patient: Home Provider: Office   I discussed the limitations, risks, security and privacy concerns of performing an evaluation and management service by telephone and the availability of in person appointments. I also discussed with the patient that there may be a patient responsible charge related to this service. The patient expressed understanding and agreed to proceed.   Patient with history of cirrhosis for > 10 years. Reports fever and diarrhea since Tuesday. Reports highest temperature of 100F and lowest 99.5. Denies sick contacts. No vomiting or bloody stools. No antibiotics in the past three months. Works in [employer] and went into work for three hours last Tuesday. Does report continued appetite and eating well. Reports he feels somewhat better and has improved in diarrhea but still unsettled. Temperature normal in the morning and increases over the day. Temperature this morning temperature was 100F. Denies abdominal distention or yellowing of skin. Denies abdominal pain. Denies ever having issues with ascites.   Diarrhea  Associated symptoms include a fever.     Allergies  Allergen Reactions  . Asa [Aspirin] Shortness Of Breath    "asthma symptoms"  . Penicillins Shortness Of Breath    Has patient had a PCN reaction causing immediate rash, facial/tongue/throat swelling, SOB or lightheadedness with hypotension: Yes Has patient had a PCN reaction causing severe rash involving mucus membranes  or skin necrosis: No Has patient had a PCN reaction that required hospitalization No Has patient had a PCN reaction occurring within the last 10 years: No If all of the above answers are "NO", then may proceed with Cephalosporin use. "asthma symptoms"  . Vancomycin Anaphylaxis    Immediately following being turned into prone position and Vancomycin administration in the OR patient cardiac arrest w/  Vfib.     Current Outpatient Medications:  .  fluticasone (FLONASE) 50 MCG/ACT nasal spray, Place 2 sprays into both nostrils daily for 30 days., Disp: 16 g, Rfl: 1 .  glipiZIDE (GLUCOTROL XL) 2.5 MG 24 hr tablet, Take 1 tablet (2.5 mg total) by mouth daily with breakfast., Disp: 30 tablet, Rfl: 3 .  pantoprazole (PROTONIX) 40 MG tablet, Take 1 tablet (40 mg total) by mouth daily., Disp: 90 tablet, Rfl: 3  Review of Systems  Constitutional: Positive for fever.  Respiratory: Negative.   Gastrointestinal: Positive for diarrhea.  Genitourinary: Negative.   Neurological: Negative.     Social History   Tobacco Use  . Smoking status: Former Smoker    Years: 3.00    Types: Cigarettes    Quit date: 10/10/1974    Years since quitting: 44.2  . Smokeless tobacco: Never Used  Substance Use Topics  . Alcohol use: Not Currently      Objective:   There were no vitals taken for this visit. There were no vitals filed for this visit.   Physical Exam Pulmonary:     Effort: No respiratory distress.     Comments: Talking  in complete sentences without issue.        Assessment & Plan    1. Fever, unspecified fever cause  Will send for COVID testing. Must consider SBP due to history of liver disease, though patient reports he has never had ascietes and does not feel abdominal pain or distention currently and diarrhea has improved. Return precautions counseled.   2. Cirrhosis of liver without ascites, unspecified hepatic cirrhosis type [facility])   The entirety of the information documented in  the History of Present Illness, Review of Systems and Physical Exam were personally obtained by me. Portions of this information were initially documented by Rondel Baton, CMA and reviewed by me for thoroughness and accuracy.   F/u PRN  Trey Sailors, PA-C  [facility]

## 2018-12-19 ENCOUNTER — Telehealth: Payer: Self-pay | Admitting: General Practice

## 2018-12-19 ENCOUNTER — Other Ambulatory Visit: Payer: Self-pay

## 2018-12-19 DIAGNOSIS — Z20822 Contact with and (suspected) exposure to covid-19: Secondary | ICD-10-CM

## 2018-12-19 NOTE — Telephone Encounter (Signed)
Patient is returning a call to be tested for COVID.  Please call patient back at [phone]

## 2018-12-19 NOTE — Addendum Note (Signed)
Addended by: Torrie Mayers on: 12/19/2018 12:13 PM   Modules accepted: Orders

## 2018-12-19 NOTE — Telephone Encounter (Signed)
Returned patients call to schedule covid testing today at 2:15 in [place]. Instructions given and order placed.

## 2018-12-19 NOTE — Telephone Encounter (Signed)
lvm for pt at [facility] to return to schedule covid testing.

## 2018-12-20 ENCOUNTER — Emergency Department (HOSPITAL_COMMUNITY): Payer: PPO

## 2018-12-20 ENCOUNTER — Observation Stay (HOSPITAL_COMMUNITY)
Admission: EM | Admit: 2018-12-20 | Discharge: 2018-12-21 | Disposition: A | Discharge status: Home / Self Care | Payer: PPO | Attending: Internal Medicine | Admitting: Internal Medicine

## 2018-12-20 ENCOUNTER — Observation Stay (HOSPITAL_COMMUNITY): Payer: PPO

## 2018-12-20 ENCOUNTER — Other Ambulatory Visit: Payer: Self-pay

## 2018-12-20 ENCOUNTER — Encounter (HOSPITAL_COMMUNITY): Payer: Self-pay | Admitting: Emergency Medicine

## 2018-12-20 DIAGNOSIS — Z20828 Contact with and (suspected) exposure to other viral communicable diseases: Secondary | ICD-10-CM | POA: Diagnosis not present

## 2018-12-20 DIAGNOSIS — R161 Splenomegaly, not elsewhere classified: Secondary | ICD-10-CM | POA: Diagnosis not present

## 2018-12-20 DIAGNOSIS — R079 Chest pain, unspecified: Secondary | ICD-10-CM | POA: Diagnosis not present

## 2018-12-20 DIAGNOSIS — Z87891 Personal history of nicotine dependence: Secondary | ICD-10-CM | POA: Diagnosis not present

## 2018-12-20 DIAGNOSIS — D696 Thrombocytopenia, unspecified: Secondary | ICD-10-CM | POA: Diagnosis not present

## 2018-12-20 DIAGNOSIS — D708 Other neutropenia: Secondary | ICD-10-CM

## 2018-12-20 DIAGNOSIS — R0602 Shortness of breath: Secondary | ICD-10-CM | POA: Diagnosis not present

## 2018-12-20 DIAGNOSIS — Z96653 Presence of artificial knee joint, bilateral: Secondary | ICD-10-CM | POA: Diagnosis not present

## 2018-12-20 DIAGNOSIS — R109 Unspecified abdominal pain: Secondary | ICD-10-CM

## 2018-12-20 DIAGNOSIS — R739 Hyperglycemia, unspecified: Secondary | ICD-10-CM | POA: Diagnosis present

## 2018-12-20 DIAGNOSIS — R945 Abnormal results of liver function studies: Secondary | ICD-10-CM | POA: Diagnosis not present

## 2018-12-20 DIAGNOSIS — Z8679 Personal history of other diseases of the circulatory system: Secondary | ICD-10-CM | POA: Diagnosis present

## 2018-12-20 DIAGNOSIS — D72819 Decreased white blood cell count, unspecified: Secondary | ICD-10-CM

## 2018-12-20 DIAGNOSIS — R509 Fever, unspecified: Secondary | ICD-10-CM | POA: Diagnosis not present

## 2018-12-20 DIAGNOSIS — I4891 Unspecified atrial fibrillation: Secondary | ICD-10-CM

## 2018-12-20 DIAGNOSIS — I480 Paroxysmal atrial fibrillation: Secondary | ICD-10-CM | POA: Diagnosis not present

## 2018-12-20 DIAGNOSIS — I5020 Unspecified systolic (congestive) heart failure: Secondary | ICD-10-CM | POA: Diagnosis not present

## 2018-12-20 DIAGNOSIS — K7581 Nonalcoholic steatohepatitis (NASH): Secondary | ICD-10-CM | POA: Diagnosis not present

## 2018-12-20 DIAGNOSIS — E119 Type 2 diabetes mellitus without complications: Secondary | ICD-10-CM | POA: Diagnosis present

## 2018-12-20 DIAGNOSIS — Z79899 Other long term (current) drug therapy: Secondary | ICD-10-CM | POA: Insufficient documentation

## 2018-12-20 DIAGNOSIS — K766 Portal hypertension: Secondary | ICD-10-CM | POA: Insufficient documentation

## 2018-12-20 DIAGNOSIS — I8500 Esophageal varices without bleeding: Secondary | ICD-10-CM | POA: Diagnosis present

## 2018-12-20 LAB — COMPREHENSIVE METABOLIC PANEL
ALT: 43 U/L (ref 0–44)
AST: 52 U/L — ABNORMAL HIGH (ref 15–41)
Albumin: 3.7 g/dL (ref 3.5–5.0)
Alkaline Phosphatase: 197 U/L — ABNORMAL HIGH (ref 38–126)
Anion gap: 9 (ref 5–15)
BUN: 17 mg/dL (ref 8–23)
CO2: 22 mmol/L (ref 22–32)
Calcium: 8.3 mg/dL — ABNORMAL LOW (ref 8.9–10.3)
Chloride: 104 mmol/L (ref 98–111)
Creatinine, Ser: 0.82 mg/dL (ref 0.61–1.24)
GFR calc Af Amer: >60 mL/min (ref >60)
GFR calc non Af Amer: >60 mL/min (ref >60)
Glucose, Bld: 219 mg/dL — ABNORMAL HIGH (ref 70–99)
Potassium: 3.8 mmol/L (ref 3.5–5.1)
Sodium: 135 mmol/L (ref 135–145)
Total Bilirubin: 3.6 mg/dL — ABNORMAL HIGH (ref 0.3–1.2)
Total Protein: 6.5 g/dL (ref 6.5–8.1)

## 2018-12-20 LAB — CBC
HCT: 40.6 % (ref 39.0–52.0)
Hemoglobin: 14 g/dL (ref 13.0–17.0)
MCH: 28.5 pg (ref 26.0–34.0)
MCHC: 34.5 g/dL (ref 30.0–36.0)
MCV: 82.7 fL (ref 80.0–100.0)
Platelets: 69 10*3/uL — ABNORMAL LOW (ref 150–400)
RBC: 4.91 MIL/uL (ref 4.22–5.81)
RDW: 13.9 % (ref 11.5–15.5)
WBC: 1 10*3/uL — CL (ref 4.0–10.5)
nRBC: 0 % (ref 0.0–0.2)

## 2018-12-20 LAB — DIFFERENTIAL
Abs Immature Granulocytes: 0 10*3/uL (ref 0.00–0.07)
Basophils Absolute: 0 10*3/uL (ref 0.0–0.1)
Basophils Relative: 0 %
Eosinophils Absolute: 0 10*3/uL (ref 0.0–0.5)
Eosinophils Relative: 1 %
Immature Granulocytes: 0 %
Lymphocytes Relative: 26 %
Lymphs Abs: 0.2 10*3/uL — ABNORMAL LOW (ref 0.7–4.0)
Monocytes Absolute: 0.2 10*3/uL (ref 0.1–1.0)
Monocytes Relative: 24 %
Neutro Abs: 0.5 10*3/uL — ABNORMAL LOW (ref 1.7–7.7)
Neutrophils Relative %: 49 %

## 2018-12-20 LAB — LACTIC ACID, PLASMA
Lactic Acid, Venous: 1 mmol/L (ref 0.5–1.9)
Lactic Acid, Venous: 0.9 mmol/L (ref 0.5–1.9)

## 2018-12-20 LAB — URINALYSIS, ROUTINE W REFLEX MICROSCOPIC
Bilirubin Urine: NEGATIVE
Glucose, UA: NEGATIVE mg/dL
Hgb urine dipstick: NEGATIVE
Ketones, ur: NEGATIVE mg/dL
Leukocytes,Ua: NEGATIVE
Nitrite: NEGATIVE
Protein, ur: NEGATIVE mg/dL
Specific Gravity, Urine: 1.041 — ABNORMAL HIGH (ref 1.005–1.030)
pH: 5 (ref 5.0–8.0)

## 2018-12-20 LAB — TROPONIN I
Troponin I: <0.03 ng/mL (ref <0.03)
Troponin I: <0.03 ng/mL (ref <0.03)

## 2018-12-20 LAB — PROTIME-INR
INR: 1.3 — ABNORMAL HIGH (ref 0.8–1.2)
Prothrombin Time: 15.9 seconds — ABNORMAL HIGH (ref 11.4–15.2)

## 2018-12-20 LAB — APTT: aPTT: 42 seconds — ABNORMAL HIGH (ref 24–36)

## 2018-12-20 LAB — D-DIMER, QUANTITATIVE: D-Dimer, Quant: 1.21 ug/mL-FEU — ABNORMAL HIGH (ref 0.00–0.50)

## 2018-12-20 LAB — SARS CORONAVIRUS 2 BY RT PCR (HOSPITAL ORDER, PERFORMED IN ~~LOC~~ HOSPITAL LAB): SARS Coronavirus 2: NEGATIVE

## 2018-12-20 MED ORDER — ONDANSETRON HCL 4 MG/2ML IJ SOLN
4.0000 mg | Freq: Once | INTRAMUSCULAR | Status: AC
  Administered 2018-12-20: 16:00:00 4 mg via INTRAVENOUS
  Filled 2018-12-20: qty 2

## 2018-12-20 MED ORDER — ACETAMINOPHEN 325 MG PO TABS: 650.0000 mg | ORAL_TABLET | Freq: Four times a day (QID) | ORAL | Status: DC | PRN

## 2018-12-20 MED ORDER — SODIUM CHLORIDE 0.9 % IV SOLN
INTRAVENOUS | Status: DC
  Administered 2018-12-20: 16:00:00 via INTRAVENOUS

## 2018-12-20 MED ORDER — SODIUM CHLORIDE 0.9 % IV SOLN
2.0000 g | Freq: Three times a day (TID) | INTRAVENOUS | Status: DC
  Administered 2018-12-20 – 2018-12-21 (×3): 2 g via INTRAVENOUS
  Filled 2018-12-20 (×6): qty 2

## 2018-12-20 MED ORDER — SODIUM CHLORIDE 0.9 % IV BOLUS
500.0000 mL | Freq: Once | INTRAVENOUS | Status: AC
  Administered 2018-12-20: 16:00:00 500 mL via INTRAVENOUS

## 2018-12-20 MED ORDER — METRONIDAZOLE IN NACL 5-0.79 MG/ML-% IV SOLN
500.0000 mg | Freq: Once | INTRAVENOUS | Status: AC
  Administered 2018-12-20: 17:00:00 500 mg via INTRAVENOUS
  Filled 2018-12-20: qty 100

## 2018-12-20 MED ORDER — DILTIAZEM LOAD VIA INFUSION
10.0000 mg | Freq: Once | INTRAVENOUS | Status: AC
  Administered 2018-12-20: 16:00:00 10 mg via INTRAVENOUS
  Filled 2018-12-20: qty 10

## 2018-12-20 MED ORDER — SODIUM CHLORIDE 0.9 % IV SOLN: INTRAVENOUS | Status: AC

## 2018-12-20 MED ORDER — DILTIAZEM HCL 100 MG IV SOLR
5.0000 mg/h | INTRAVENOUS | Status: DC
  Administered 2018-12-20: 5 mg/h via INTRAVENOUS
  Filled 2018-12-20 (×2): qty 100

## 2018-12-20 MED ORDER — LIDOCAINE-EPINEPHRINE-TETRACAINE (LET) SOLUTION
NASAL | Status: AC
  Filled 2018-12-20: qty 3

## 2018-12-20 MED ORDER — SODIUM CHLORIDE 0.9 % IV SOLN
2.0000 g | Freq: Once | INTRAVENOUS | Status: DC
  Filled 2018-12-20: qty 2

## 2018-12-20 MED ORDER — ACETAMINOPHEN 650 MG RE SUPP: 650.0000 mg | Freq: Four times a day (QID) | RECTAL | Status: DC | PRN

## 2018-12-20 MED ORDER — DILTIAZEM HCL-DEXTROSE 100-5 MG/100ML-% IV SOLN (PREMIX)
5.0000 mg/h | INTRAVENOUS | Status: DC
  Administered 2018-12-20: 5 mg/h via INTRAVENOUS
  Filled 2018-12-20: qty 100

## 2018-12-20 MED ORDER — INSULIN ASPART 100 UNIT/ML ~~LOC~~ SOLN: 0.0000 [IU] | SUBCUTANEOUS | Status: DC

## 2018-12-20 MED ORDER — IOHEXOL 350 MG/ML SOLN
75.0000 mL | Freq: Once | INTRAVENOUS | Status: AC | PRN
  Administered 2018-12-20: 19:00:00 via INTRAVENOUS

## 2018-12-20 MED ORDER — HYDROMORPHONE HCL 1 MG/ML IJ SOLN
1.0000 mg | Freq: Once | INTRAMUSCULAR | Status: AC
  Administered 2018-12-20: 16:00:00 1 mg via INTRAVENOUS
  Filled 2018-12-20: qty 1

## 2018-12-20 NOTE — Progress Notes (Addendum)
Pharmacy Antibiotic Note  Stephen Burch is a 63 y.o. male admitted on 12/20/2018 with sepsis.  Pharmacy has been consulted for aztreonam dosing.   Upon further investigation of patient's penicillin allergy, it was found that he has tolerated cefazolin, ceftriaxone and cephalexin in the past. Will adjust aztreonam to cefepime per Pharmacy Protocol.  Cefepime is less likely to cause cross reactivity in patients with PCN allergy  Plan: DC Aztreonam--->patient has tolerated multiple cephalosporins in past years  Start cefepime 2g IV q8h Pharmacy will monitor labs, cultures and patient progress.   Height: 6' (182.9 cm) Weight: 196 lb (88.9 kg) IBW/kg (Calculated) : 77.6  Temp (24hrs), Avg:98.5 F (36.9 C), Min:98.5 F (36.9 C), Max:98.5 F (36.9 C)  Recent Labs  Lab 12/20/18 1549  WBC 1.0*  CREATININE 0.82    Estimated Creatinine Clearance: 99.9 mL/min (by C-G formula based on SCr of 0.82 mg/dL).    Allergies  Allergen Reactions  . Asa [Aspirin] Shortness Of Breath    "asthma symptoms"  . Penicillins Shortness Of Breath    Has patient had a PCN reaction causing immediate rash, facial/tongue/throat swelling, SOB or lightheadedness with hypotension: Yes Has patient had a PCN reaction causing severe rash involving mucus membranes or skin necrosis: No Has patient had a PCN reaction that required hospitalization No Has patient had a PCN reaction occurring within the last 10 years: No If all of the above answers are "NO", then may proceed with Cephalosporin use. "asthma symptoms" Patient has tolerated cefazolin, ceftriaxo  . Vancomycin Anaphylaxis    Immediately following being turned into prone position and Vancomycin administration in the OR patient cardiac arrest w/  Vfib.    Antimicrobials this admission: aztreonam 6/20 >>6/20 cefepime 6/20 >>   metronidazole 6/20>>   Microbiology results: nothing ordered at this time   BCx:     UCx:      Sputum:      MRSA PCR:    Thank  you for allowing pharmacy to be a part of this patient's care.  Despina Pole 12/20/2018 5:15 PM

## 2018-12-20 NOTE — ED Notes (Signed)
Stephen Burch [phone] pt daughter.

## 2018-12-20 NOTE — ED Notes (Signed)
[facility] sending truck

## 2018-12-20 NOTE — ED Notes (Signed)
CRITICAL VALUE ALERT  Critical Value: WBC 1.0  Date & Time Notied:  12/20/18 @ 5400  Provider Notified: Dr Rogene Houston   Orders Received/Actions taken: Orders to be given

## 2018-12-20 NOTE — ED Provider Notes (Addendum)
[facility] EMERGENCY DEPARTMENT Provider Note   CSN: [id number] Arrival date & time: 12/20/18  1516    History   Chief Complaint Chief Complaint  Patient presents with   Rib cage Pain    HPI Stephen Burch is a 63 y.o. male.     All that patient brought himself to the emergency department.  He is got a complaint of left anterior chest pain this down on the lower part of the chest wall.  Began today when he was mowing the yard that would have been onset was around 3:00 this afternoon.  No known injury.  Pain worsens with movement and deep inspiration.  Patient had COVID-19 testing yesterday because he was running mild fever and had diarrhea and a few days ago his work required him to be tested.  Patient has not gotten results yet.  Patient denies any palpitations.  Patient denies any abdominal pain.  Patient has a history of nonalcoholic cirrhosis.  Patient known to have chronically low white blood cell count.     Past Medical History:  Diagnosis Date   Arthritis    knees, wrists   Barrett's esophagus    Complication of anesthesia    02-22-2016 intraop cardiac arrest immediately following moving pt into prone position and vancomyocin administration , pt cardiac arrest w/ Vfib,  please refer to anesthesia record in epic   Esophageal varices determined by endoscopy ([place]) 11/2016   grade 1   GERD (gastroesophageal reflux disease)    Hiatal hernia    History of adenomatous polyp of colon    History of asthma    as child   History of DVT (deep vein thrombosis)    right lower extremitty post-op right total knee surgery 09/ 2010,  treated w/ coumadin   History of esophageal dilatation 07/1998   for schatzski ring   History of kidney stones    History of staph infection 04/2016   MSSA infection of right total knee w/ sepsis   Hx of cardiac arrest    02-22-2016  intraoperative, immediately following moving pt into prone position and vancomyocin administration,  cardiac arrest w/ Vfib (referred to anesthesia record in epic) pt extubated himself next day   Liver cirrhosis secondary to NASH (nonalcoholic steatohepatitis) ([place])    NAFLD-- followed by dr Laural Golden---  liver bx 09-30-2012  mild portal and focal sinusoidal fibrosis   Scapholunate advanced collapse of left wrist    deformity   Thrombocytopenia ([place])    10-09-2017 per pt his platelets have always been low , followed by pcp, never been referred to hematologist    Patient Active Problem List   Diagnosis Date Noted   Acute systolic (congestive) heart failure ([place]) 04/30/2018   Scapholunate advanced collapse of left wrist 08/27/2017   NAFLD (nonalcoholic fatty liver disease) 04/04/2016   Unsteadiness on feet    Staphylococcus aureus bacteremia with sepsis ([place])    Infection of prosthetic right knee joint ([place])    Knee pain, acute    Pyogenic arthritis of right knee joint ([place]) 04/02/2016   Thrombocytopenia ([place])    Hypomagnesemia    Cardiac arrest ([place]) 02/22/2016   Encounter for central line placement    Shock circulatory ([place])    Vitiligo 08/22/2015   AD (atopic dermatitis) 08/22/2015   Osteoarthritis of right knee 08/22/2015   Cirrhosis of liver without ascites ([place]) 08/22/2015   Hyperglycemia 08/22/2015   GERD (gastroesophageal reflux disease) 08/22/2015   Ganglion of left wrist 08/22/2015   ED (  erectile dysfunction) of organic origin 08/22/2015   Asthma 08/22/2015   Dysplastic nodule of liver 08/22/2015   Baker cyst 09/06/2014   H/O total knee replacement 08/05/2014   Bilateral inguinal hernia 08/01/2012   Splenomegaly 08/01/2012   Portal hypertension ([place]) 08/01/2012   Gallstones 08/01/2012    Past Surgical History:  Procedure Laterality Date   APPLICATION OF WOUND VAC Right 02/22/2016   Procedure: APPLICATION OF WOUND VAC;  Surgeon: Vickey Huger, MD;  Location: [place];  Service: Orthopedics;  Laterality: Right;   CARDIAC CATHETERIZATION  N/A 02/24/2016   Procedure: Left Heart Cath and Coronary Angiography;  Surgeon: Burnell Blanks, MD;  Location: [place] [facility];  Service: Cardiovascular;  Laterality: N/A;  No angiographic evidence of CAD,  normal LVSF, ef 50-55%   CARPECTOMY Left 10/17/2017   Procedure: LEFT WRIST PROXIMAL ROW CARPECTOMY WITH POSTEROR IMBROSSEOUS NERVE EXCISION;  Surgeon: Charlotte Crumb, MD;  Location: [place];  Service: Orthopedics;  Laterality: Left;  AXILLARY BLOCK   EAR CYST EXCISION Right 09/06/2014   Procedure: OPEN EXCISION BAKER'S CYST RIGHT KNEE;  Surgeon: Vickey Huger, MD;  Location: [place];  Service: Orthopedics;  Laterality: Right;   ESOPHAGEAL DILATION  1996/ 2000   ESOPHAGOGASTRODUODENOSCOPY N/A 09/17/2012   Procedure: ESOPHAGOGASTRODUODENOSCOPY (EGD);  Surgeon: Rogene Houston, MD;  [facility];  Service: Endoscopy;  Laterality: N/A;  200   ESOPHAGOGASTRODUODENOSCOPY N/A 12/15/2015   Procedure: ESOPHAGOGASTRODUODENOSCOPY (EGD);  Surgeon: Rogene Houston, MD;  [facility];  Service: Endoscopy;  Laterality: N/A;  1245   ESOPHAGOGASTRODUODENOSCOPY N/A 12/17/2016   Procedure: ESOPHAGOGASTRODUODENOSCOPY (EGD);  Surgeon: Rogene Houston, MD;  [facility];  Service: Endoscopy;  Laterality: N/A;  210   I&D KNEE WITH POLY EXCHANGE Right 04/02/2016   Procedure: IRRIGATION AND DEBRIDEMENT RIGHT KNEE WITH POLY EXCHANGE;  Surgeon: Vickey Huger, MD;  Location: [place];  Service: Orthopedics;  Laterality: Right;   INCISION AND DRAINAGE HEMATOMA POST LEFT  TOTAL KNEE  2012   INGUINAL HERNIA REPAIR Bilateral 05/22/2013   Procedure: REPAIR OF RECURRENT INCARCERATED INGUINAL HERNIA WITH MESH RIGHT SIDE,  REPAIR OF RECURRENT INGUINAL HERNIA WITH MESH LEFT SIDE;  Surgeon: [name], MD;  Location: [place];  Service: General;  Laterality: Bilateral;   INGUINAL HERNIA REPAIR Bilateral 11/1997   INSERTION OF MESH Bilateral 05/22/2013   Procedure:  INSERTION OF MESH;  Surgeon: [name], MD;  Location: [place];  Service: General;  Laterality: Bilateral;   IRRIGATION AND DEBRIDEMENT KNEE Right 02/22/2016   Procedure: IRRIGATION AND DEBRIDEMENT KNEE;  Surgeon: Vickey Huger, MD;  Location: [place];  Service: Orthopedics;  Laterality: Right;   Irrigation and Debridement right knee  03/23/2009   Dr. Sabra Heck, [place] ARTHROSCOPY W/ SYNOVECTOMY Right 01/30/2010   AND DEBRIDEMENT OF HETEROTOPIC   LIVER BIOPSY  09/30/2012   REVISION TOTAL KNEE ARTHROPLASTY Left fall 2012   TONSILLECTOMY  child   TOTAL KNEE ARTHROPLASTY Bilateral right 09/ 2010/  left 09-18-2010   TRANSTHORACIC ECHOCARDIOGRAM  02/22/2016   ef 30-35% (per cardiac cath 02-24-2016 normal), severe hypokinesis of the mid-apicalanteroseptal and apical myocardium,  grade 1 diastolic dysfunction/ trivial PR and TR   WRIST ARTHROPLASTY Left 10/17/2017   Procedure: CAPITATE RESURFACING ARTHROPLASTY;  Surgeon: Charlotte Crumb, MD;  Location: [facility];  Service: Orthopedics;  Laterality: Left;        Home Medications    Prior to Admission medications   Medication Sig Start  Date End Date Taking? Authorizing Provider  pantoprazole (PROTONIX) 40 MG tablet Take 1 tablet (40 mg total) by mouth daily. Patient taking differently: Take 40 mg by mouth every 3 (three) days.  07/15/18  Yes Rehman, Mechele Dawley, MD  fluticasone (FLONASE) 50 MCG/ACT nasal spray Place 2 sprays into both nostrils daily for 30 days. Patient not taking: Reported on 12/20/2018 08/21/18 12/20/18  Birdie Sons, MD  glipiZIDE (GLUCOTROL XL) 2.5 MG 24 hr tablet Take 1 tablet (2.5 mg total) by mouth daily with breakfast. Patient not taking: Reported on 12/20/2018 11/14/18   Birdie Sons, MD    Family History Family History  Problem Relation Age of Onset   Diabetes Mother        type 2, born 57   Arthritis Father        died age 28   GI Bleed Father        upper GI Bleed, non-ETOH  cirrhosis   Arthritis Brother    Diabetes Brother        type 2   Diabetes Paternal Uncle        type 2   Heart attack Maternal Grandmother    Heart attack Maternal Grandfather    Colon cancer Neg Hx    Colon polyps Neg Hx     Social History Social History   Tobacco Use   Smoking status: Former Smoker    Years: 3.00    Types: Cigarettes    Quit date: 10/10/1974    Years since quitting: 44.2   Smokeless tobacco: Never Used  Substance Use Topics   Alcohol use: Not Currently   Drug use: No     Allergies   Asa [aspirin], Penicillins, and Vancomycin   Review of Systems Review of Systems  Constitutional: Positive for fever. Negative for chills.  HENT: Negative for congestion, rhinorrhea and sore throat.   Eyes: Negative for visual disturbance.  Respiratory: Negative for cough and shortness of breath.   Cardiovascular: Positive for chest pain. Negative for palpitations and leg swelling.  Gastrointestinal: Positive for diarrhea. Negative for abdominal pain, nausea and vomiting.  Genitourinary: Negative for dysuria.  Musculoskeletal: Negative for back pain and neck pain.  Skin: Negative for rash.  Neurological: Negative for dizziness, light-headedness and headaches.  Hematological: Does not bruise/bleed easily.  Psychiatric/Behavioral: Negative for confusion.     Physical Exam Updated Vital Signs BP 108/66    Pulse (!) 116    Temp 98.5 F (36.9 C) (Oral)    Resp (!) 26    Ht 1.829 m (6')    Wt 88.9 kg    SpO2 98%    BMI 26.58 kg/m   Physical Exam Vitals signs and nursing note reviewed.  Constitutional:      Appearance: He is well-developed. He is ill-appearing.  HENT:     Head: Normocephalic and atraumatic.  Eyes:     Extraocular Movements: Extraocular movements intact.     Conjunctiva/sclera: Conjunctivae normal.     Pupils: Pupils are equal, round, and reactive to light.  Neck:     Musculoskeletal: Normal range of motion and neck supple.    Cardiovascular:     Rate and Rhythm: Tachycardia present. Rhythm irregular.     Heart sounds: No murmur.  Pulmonary:     Effort: Pulmonary effort is normal. No respiratory distress.     Breath sounds: Normal breath sounds. No wheezing.  Chest:     Chest wall: No tenderness.  Abdominal:  General: Bowel sounds are normal.     Palpations: Abdomen is soft.     Tenderness: There is no abdominal tenderness.  Musculoskeletal: Normal range of motion.        General: No swelling.  Skin:    General: Skin is warm and dry.     Capillary Refill: Capillary refill takes less than 2 seconds.  Neurological:     General: No focal deficit present.     Mental Status: He is alert and oriented to person, place, and time.      ED Treatments / Results  Labs (all labs ordered are listed, but only abnormal results are displayed) Labs Reviewed  COMPREHENSIVE METABOLIC PANEL - Abnormal; Notable for the following components:      Result Value   Glucose, Bld 219 (*)    Calcium 8.3 (*)    AST 52 (*)    Alkaline Phosphatase 197 (*)    Total Bilirubin 3.6 (*)    All other components within normal limits  CBC - Abnormal; Notable for the following components:   WBC 1.0 (*)    Platelets 69 (*)    All other components within normal limits  D-DIMER, QUANTITATIVE (NOT AT [facility]) - Abnormal; Notable for the following components:   D-Dimer, Quant 1.21 (*)    All other components within normal limits  DIFFERENTIAL - Abnormal; Notable for the following components:   Neutro Abs 0.5 (*)    Lymphs Abs 0.2 (*)    All other components within normal limits  SARS CORONAVIRUS 2 (HOSPITAL ORDER, [place] [facility])  CULTURE, BLOOD (ROUTINE X 2)  CULTURE, BLOOD (ROUTINE X 2)  TROPONIN I  LACTIC ACID, PLASMA  LACTIC ACID, PLASMA  URINALYSIS, ROUTINE W REFLEX MICROSCOPIC    EKG  ED ECG REPORT   Date: 12/20/2018  Rate: 159  Rhythm: atrial fibrillation  QRS Axis: normal  Intervals:  normal  ST/T Wave abnormalities: normal  Conduction Disutrbances:none  Narrative Interpretation:   Old EKG Reviewed: none available  I have personally reviewed the EKG tracing and agree with the computerized printout as noted.  None  Radiology Ct Angio Chest Pe W/cm &/or Wo Cm  Result Date: 12/20/2018 CLINICAL DATA:  Onset left chest pain today after cutting grass. Pain is worse with inspiration. EXAM: CT ANGIOGRAPHY CHEST WITH CONTRAST TECHNIQUE: Multidetector CT imaging of the chest was performed using the standard protocol during bolus administration of intravenous contrast. Multiplanar CT image reconstructions and MIPs were obtained to evaluate the vascular anatomy. CONTRAST:  75 mL OMNIPAQUE IOHEXOL 350 MG/ML SOLN COMPARISON:  Single view of the chest earlier today. CT chest 03/04/2016. FINDINGS: Cardiovascular: Satisfactory opacification of the pulmonary arteries to the segmental level. No evidence of pulmonary embolism. Normal heart size. No pericardial effusion. Mediastinum/Nodes: No enlarged mediastinal, hilar, or axillary lymph nodes. Thyroid gland, trachea, and esophagus demonstrate no significant findings. Lungs/Pleura: No pleural effusion. Mild dependent atelectasis. No consolidative process or nodule. Upper Abdomen: They are is partial visualization of marked splenomegaly. The spleen measures approximately 18 cm AP x 8.7 cm transverse compared 18 cm x 7.8 cm transverse at approximately the same level on the prior CT. Imaged upper abdomen is otherwise negative. Musculoskeletal: No acute or focal bony abnormality. Review of the MIP images confirms the above findings. IMPRESSION: Negative for pulmonary embolus or acute cardiopulmonary disease. Marked splenomegaly appears slightly worse than on the prior CT. Electronically Signed   By: Inge Rise M.D.   On: 12/20/2018 19:21  Dg Chest Port 1 View  Result Date: 12/20/2018 CLINICAL DATA:  Chest pain and shortness of breath. No known  injury. Worse with movement and deep inspiration. EXAM: PORTABLE CHEST 1 VIEW COMPARISON:  04/01/2016; chest CT-03/04/2016 FINDINGS: Grossly unchanged borderline enlarged cardiac silhouette and mediastinal contours given persistently reduced lung volumes. Mild diffuse nodular thickening of the pulmonary interstitium. No discrete focal airspace opacities. No pleural effusion or pneumothorax. No evidence of edema. No acute osseus abnormalities. IMPRESSION: Suspected mild airways disease / bronchitis on this AP portable examination. No focal airspace opacities to suggest pneumonia. Further evaluation with a PA and lateral chest radiograph may be obtained as clinically indicated. Electronically Signed   By: Sandi Mariscal M.D.   On: 12/20/2018 16:23    Procedures Procedures (including critical care time)  CRITICAL CARE Performed by: Fredia Sorrow Total critical care time: 45 minutes Critical care time was exclusive of separately billable procedures and treating other patients. Critical care was necessary to treat or prevent imminent or life-threatening deterioration. Critical care was time spent personally by me on the following activities: development of treatment plan with patient and/or surrogate as well as nursing, discussions with consultants, evaluation of patient's response to treatment, examination of patient, obtaining history from patient or surrogate, ordering and performing treatments and interventions, ordering and review of laboratory studies, ordering and review of radiographic studies, pulse oximetry and re-evaluation of patient's condition.   Medications Ordered in ED Medications  0.9 %  sodium chloride infusion ( Intravenous Restarted 12/20/18 1705)  diltiazem (CARDIZEM) 100 mg in dextrose 5 % 100 mL (1 mg/mL) infusion (15 mg/hr Intravenous Rate/Dose Change 12/20/18 1956)  ceFEPIme (MAXIPIME) 2 g in sodium chloride 0.9 % 100 mL IVPB (0 g Intravenous Stopped 12/20/18 1955)   lidocaine-EPINEPHrine-tetracaine (LET) solution (has no administration in time range)  sodium chloride 0.9 % bolus 500 mL (0 mLs Intravenous Stopped 12/20/18 1625)  ondansetron (ZOFRAN) injection 4 mg (4 mg Intravenous Given 12/20/18 1603)  HYDROmorphone (DILAUDID) injection 1 mg (1 mg Intravenous Given 12/20/18 1603)  diltiazem (CARDIZEM) 1 mg/mL load via infusion 10 mg (10 mg Intravenous Bolus from Bag 12/20/18 1624)  metroNIDAZOLE (FLAGYL) IVPB 500 mg (0 mg Intravenous Stopped 12/20/18 1811)  iohexol (OMNIPAQUE) 350 MG/ML injection 75 mL ( Intravenous Contrast Given 12/20/18 1839)     Initial Impression / Assessment and Plan / ED Course  I have reviewed the triage vital signs and the nursing notes.  Pertinent labs & imaging results that were available during my care of the patient were reviewed by me and considered in my medical decision making (see chart for details).      Patient on presentation due to his tachycardia and increased respiratory rate although oxygen sats were 98% patient met sepsis criteria so started on broad-spectrum antibiotics clinically he was in rapid ventricular rate atrial fib.  No past history of atrial fib.  For this patient received 10 mg of diltiazem and then started on diltiazem drip.  Having some difficulty controlling his heart rate will vary anywhere from low 100s up to 130 but seems to be improving.  Patient had elevated d-dimer so get CT Angie of chest negative for PE also negative for any pneumonia.  Patient has a marked leukopenia.  But chronically has had low white blood cell counts but they are lower.  Patient's COVID testing was negative.  Patient was treated with blood cultures and broad-spectrum antibiotics based on the sepsis criteria did not meet criteria for full full fluid challenge.  Patient's lactic acid were not elevated.  Patient's troponin was normal.  Patient will require admission for the atrial fibrillation.    Final Clinical Impressions(s) /  ED Diagnoses   Final diagnoses:  Atrial fibrillation, unspecified type ([place])  Leukopenia, unspecified type    ED Discharge Orders    None       Fredia Sorrow, MD 12/20/18 1956   Addendum:.  Patient now with complaint of left upper quadrant abdominal pain.  We will go ahead and add on CT abdomen pelvis however since he just had heavy dye load with the CT angios we will go ahead and do this without dye.  Discussed with hospitalist they will admit.  Unlikely the patient has significant acute abdominal process.  But his alk phos and total bili is elevated compared to past ones.  But has no tenderness in the right upper quadrant.   Fredia Sorrow, MD 12/20/18 2012

## 2018-12-20 NOTE — ED Triage Notes (Addendum)
Pt c/o LT ribcage pain that began today after he mowed the yard. Denies injury. Pain worsens with movement and deep inspiration. Pt also reports that he was tested for COVID yesterday because he was running a mild fever and had diarrhea a few days ago and his work required him to be tested. Pt has not gotten results. Denies SOB or cough. HR fluctuates 140s down to 70s.

## 2018-12-20 NOTE — H&P (Addendum)
TRH H&P    Patient Demographics:    Stephen Burch, is a 63 y.o. male  MRN: [id number]  DOB - 04/03/1956  Admit Date - 12/20/2018  Referring MD/NP/PA: Idalia Needle  Outpatient Primary MD for the patient is Fisher, Demetrios Isaacs, MD [facility] - GI  Patient coming from: home  Chief complaint- left chest pain   HPI:    Stephen Burch  is a 63 y.o. male, w Genella Rife, Barretts esophagus, esophageal stricture, Elita Boone with esophageal varices,  Thrombocytopenia, h/o DVT who presents with c/o LUQ , LL chest pain.  Pt states went outside to mow the grass from about 11:30am to about 2pm and when came indoors began to experience sharp pain.  Pt states that pain lasted til came to ED and given pain medication.   Earlier in the week starting Tuesday apparently had some low grade temp and GI upset. Pt states that resolved over several days. Now presents for LUQ pain. Pt denies fever, chills, cough, sob, n/v, diarrhea, brbpr, black stool.  Pt denies nsaid use, aspirin use.    In ED, T 98.5 P 152, R 28 Bp 102/81  Pox 98% Wt 88.9 kg  CTA chest IMPRESSION: Negative for pulmonary embolus or acute cardiopulmonary disease. Marked splenomegaly appears slightly worse than on the prior CT.  CT abd/ pelvis IMPRESSION: 1. No evidence of acute abnormality 2. Moderate to marked splenomegaly with splenic volume of 2000 cc, doubled since 2017. 3. Question cirrhosis. 4.  Aortic Atherosclerosis (ICD10-I70.0).   Covid - 19 negative  Ekg afib at 155, nl axis,  Trop <0.03 D dimer 1.21  Wbc 1.0, hgb 14.0, Plt 69 Lactic acid 0.9  Na 135, K 3.8, Bun 17, Creatinine 0.82 Ast 52, Alt 43, Alk phos 197, T. Bili 3.6 Urinalysis negative  Pt will be admitted for Afib with RVR and left upper quadrant/ left lower chest pain       Review of systems:    In addition to the HPI above,  No Fever-chills, No Headache, No changes with Vision  or hearing, No problems swallowing food or Liquids, No Cough or Shortness of Breath, No Nausea or Vomiting, bowel movements are regular, No Blood in stool or Urine, No dysuria, No new skin rashes or bruises, No new joints pains-aches,  No new weakness, tingling, numbness in any extremity, No recent weight gain or loss, No polyuria, polydypsia or polyphagia, No significant Mental Stressors.  All other systems reviewed and are negative.    Past History of the following :    Past Medical History:  Diagnosis Date   Arthritis    knees, wrists   Barrett's esophagus    Complication of anesthesia    02-22-2016 intraop cardiac arrest immediately following moving pt into prone position and vancomyocin administration , pt cardiac arrest w/ Vfib,  please refer to anesthesia record in epic   Esophageal varices determined by endoscopy (HCC) 11/2016   grade 1   GERD (gastroesophageal reflux disease)    Hiatal hernia    History of adenomatous polyp of  colon    History of asthma    as child   History of DVT (deep vein thrombosis)    right lower extremitty post-op right total knee surgery 09/ 2010,  treated w/ coumadin   History of esophageal dilatation 07/1998   for schatzski ring   History of kidney stones    History of staph infection 04/2016   MSSA infection of right total knee w/ sepsis   Hx of cardiac arrest    02-22-2016  intraoperative, immediately following moving pt into prone position and vancomyocin administration, cardiac arrest w/ Vfib (referred to anesthesia record in epic) pt extubated himself next day   Liver cirrhosis secondary to NASH (nonalcoholic steatohepatitis) (HCC)    NAFLD-- followed by dr Karilyn Cota---  liver bx 09-30-2012  mild portal and focal sinusoidal fibrosis   Scapholunate advanced collapse of left wrist    deformity   Thrombocytopenia (HCC)    10-09-2017 per pt his platelets have always been low , followed by pcp, never been referred to  hematologist      Past Surgical History:  Procedure Laterality Date   APPLICATION OF WOUND VAC Right 02/22/2016   Procedure: APPLICATION OF WOUND VAC;  Surgeon: Dannielle Huh, MD;  Location: [facility];  Service: Orthopedics;  Laterality: Right;   CARDIAC CATHETERIZATION N/A 02/24/2016   Procedure: Left Heart Cath and Coronary Angiography;  Surgeon: Kathleene Hazel, MD;  Location: [facility] INVASIVE CV LAB;  Service: Cardiovascular;  Laterality: N/A;  No angiographic evidence of CAD,  normal LVSF, ef 50-55%   CARPECTOMY Left 10/17/2017   Procedure: LEFT WRIST PROXIMAL ROW CARPECTOMY WITH POSTEROR IMBROSSEOUS NERVE EXCISION;  Surgeon: Dairl Ponder, MD;  Location: [facility];  Service: Orthopedics;  Laterality: Left;  AXILLARY BLOCK   EAR CYST EXCISION Right 09/06/2014   Procedure: OPEN EXCISION BAKER'S CYST RIGHT KNEE;  Surgeon: Dannielle Huh, MD;  Location: [facility];  Service: Orthopedics;  Laterality: Right;   ESOPHAGEAL DILATION  1996/ 2000   ESOPHAGOGASTRODUODENOSCOPY N/A 09/17/2012   Procedure: ESOPHAGOGASTRODUODENOSCOPY (EGD);  Surgeon: Malissa Hippo, MD;  [facility];  Service: Endoscopy;  Laterality: N/A;  200   ESOPHAGOGASTRODUODENOSCOPY N/A 12/15/2015   Procedure: ESOPHAGOGASTRODUODENOSCOPY (EGD);  Surgeon: Malissa Hippo, MD;  [facility];  Service: Endoscopy;  Laterality: N/A;  1245   ESOPHAGOGASTRODUODENOSCOPY N/A 12/17/2016   Procedure: ESOPHAGOGASTRODUODENOSCOPY (EGD);  Surgeon: Malissa Hippo, MD;  [facility];  Service: Endoscopy;  Laterality: N/A;  210   I&D KNEE WITH POLY EXCHANGE Right 04/02/2016   Procedure: IRRIGATION AND DEBRIDEMENT RIGHT KNEE WITH POLY EXCHANGE;  Surgeon: Dannielle Huh, MD;  Location: [facility];  Service: Orthopedics;  Laterality: Right;   INCISION AND DRAINAGE HEMATOMA POST LEFT  TOTAL KNEE  2012   INGUINAL HERNIA REPAIR Bilateral 05/22/2013   Procedure: REPAIR OF RECURRENT INCARCERATED INGUINAL HERNIA WITH  MESH RIGHT SIDE,  REPAIR OF RECURRENT INGUINAL HERNIA WITH MESH LEFT SIDE;  Surgeon: Ernestene Mention, MD;  Location: [facility];  Service: General;  Laterality: Bilateral;   INGUINAL HERNIA REPAIR Bilateral 11/1997   INSERTION OF MESH Bilateral 05/22/2013   Procedure: INSERTION OF MESH;  Surgeon: Ernestene Mention, MD;  Location: [facility];  Service: General;  Laterality: Bilateral;   IRRIGATION AND DEBRIDEMENT KNEE Right 02/22/2016   Procedure: IRRIGATION AND DEBRIDEMENT KNEE;  Surgeon: Dannielle Huh, MD;  Location: [facility];  Service: Orthopedics;  Laterality: Right;   Irrigation and Debridement right knee  03/23/2009   Dr. Hyacinth Meeker,  [facility]   KNEE ARTHROSCOPY W/ SYNOVECTOMY Right 01/30/2010   AND DEBRIDEMENT OF HETEROTOPIC   LIVER BIOPSY  09/30/2012   REVISION TOTAL KNEE ARTHROPLASTY Left fall 2012   TONSILLECTOMY  child   TOTAL KNEE ARTHROPLASTY Bilateral right 09/ 2010/  left 09-18-2010   TRANSTHORACIC ECHOCARDIOGRAM  02/22/2016   ef 30-35% (per cardiac cath 02-24-2016 normal), severe hypokinesis of the mid-apicalanteroseptal and apical myocardium,  grade 1 diastolic dysfunction/ trivial PR and TR   WRIST ARTHROPLASTY Left 10/17/2017   Procedure: CAPITATE RESURFACING ARTHROPLASTY;  Surgeon: Dairl Ponder, MD;  Location: [facility];  Service: Orthopedics;  Laterality: Left;      Social History:      Social History   Tobacco Use   Smoking status: Former Smoker    Years: 3.00    Types: Cigarettes    Quit date: 10/10/1974    Years since quitting: 44.2   Smokeless tobacco: Never Used  Substance Use Topics   Alcohol use: Not Currently       Family History :     Family History  Problem Relation Age of Onset   Diabetes Mother        type 2, born 61   Arthritis Father        died age 94   GI Bleed Father        upper GI Bleed, non-ETOH cirrhosis   Arthritis Brother    Diabetes Brother        type 2   Diabetes Paternal Uncle        type 2   Heart  attack Maternal Grandmother    Heart attack Maternal Grandfather    Colon cancer Neg Hx    Colon polyps Neg Hx        Home Medications:   Prior to Admission medications   Medication Sig Start Date End Date Taking? Authorizing Provider  pantoprazole (PROTONIX) 40 MG tablet Take 1 tablet (40 mg total) by mouth daily. Patient taking differently: Take 40 mg by mouth every 3 (three) days.  07/15/18  Yes Rehman, Joline Maxcy, MD  fluticasone (FLONASE) 50 MCG/ACT nasal spray Place 2 sprays into both nostrils daily for 30 days. Patient not taking: Reported on 12/20/2018 08/21/18 12/20/18  Malva Limes, MD  glipiZIDE (GLUCOTROL XL) 2.5 MG 24 hr tablet Take 1 tablet (2.5 mg total) by mouth daily with breakfast. Patient not taking: Reported on 12/20/2018 11/14/18   Malva Limes, MD     Allergies:     Allergies  Allergen Reactions   Asa [Aspirin] Shortness Of Breath    "asthma symptoms"   Penicillins Shortness Of Breath    Has patient had a PCN reaction causing immediate rash, facial/tongue/throat swelling, SOB or lightheadedness with hypotension: Yes Has patient had a PCN reaction causing severe rash involving mucus membranes or skin necrosis: No Has patient had a PCN reaction that required hospitalization No Has patient had a PCN reaction occurring within the last 10 years: No If all of the above answers are "NO", then may proceed with Cephalosporin use. "asthma symptoms" Patient has tolerated cefazolin, ceftriaxo   Vancomycin Anaphylaxis    Immediately following being turned into prone position and Vancomycin administration in the OR patient cardiac arrest w/  Vfib.     Physical Exam:   Vitals  Blood pressure 108/60, pulse 92, temperature 98.5 F (36.9 C), temperature source Oral, resp. rate (!) 28, height 6' (1.829 m), weight 88.9 kg, SpO2 98 %.  1.  General:  axox3  2. Psychiatric: euthymic  3. Neurologic: cn2-12 intact, reflexes 2+ symmetric, diffuse with no clonus,  motor 5/5 in all 4 ext  4. HEENMT:  Anicteric, pupils 1.75mm symmetric, direct, consensual intact  5. Respiratory : CTAB  6. Cardiovascular : rrr s1, s2  7. Gastrointestinal:  Abd: soft, nt, nd, +bs + splenomegaly  8. Skin:  Ext: no c/c/e,  No rash  9.Musculoskeletal:  Good ROM    Data Review:    CBC Recent Labs  Lab 12/20/18 1549  WBC 1.0*  HGB 14.0  HCT 40.6  PLT 69*  MCV 82.7  MCH 28.5  MCHC 34.5  RDW 13.9  LYMPHSABS 0.2*  MONOABS 0.2  EOSABS 0.0  BASOSABS 0.0   ------------------------------------------------------------------------------------------------------------------  Results for orders placed or performed during the hospital encounter of 12/20/18 (from the past 48 hour(s))  SARS Coronavirus 2 (CEPHEID- Performed in [facility] hospital lab), Hosp Order     Status: None   Collection Time: 12/20/18  3:46 PM   Specimen: Nasopharyngeal Swab  Result Value Ref Range   SARS Coronavirus 2 NEGATIVE NEGATIVE    Comment: (NOTE) If result is NEGATIVE SARS-CoV-2 target nucleic acids are NOT DETECTED. The SARS-CoV-2 RNA is generally detectable in upper and lower  respiratory specimens during the acute phase of infection. The lowest  concentration of SARS-CoV-2 viral copies this assay can detect is 250  copies / mL. A negative result does not preclude SARS-CoV-2 infection  and should not be used as the sole basis for treatment or other  patient management decisions.  A negative result may occur with  improper specimen collection / handling, submission of specimen other  than nasopharyngeal swab, presence of viral mutation(s) within the  areas targeted by this assay, and inadequate number of viral copies  (<250 copies / mL). A negative result must be combined with clinical  observations, patient history, and epidemiological information. If result is POSITIVE SARS-CoV-2 target nucleic acids are DETECTED. The SARS-CoV-2 RNA is generally detectable in upper  and lower  respiratory specimens dur ing the acute phase of infection.  Positive  results are indicative of active infection with SARS-CoV-2.  Clinical  correlation with patient history and other diagnostic information is  necessary to determine patient infection status.  Positive results do  not rule out bacterial infection or co-infection with other viruses. If result is PRESUMPTIVE POSTIVE SARS-CoV-2 nucleic acids MAY BE PRESENT.   A presumptive positive result was obtained on the submitted specimen  and confirmed on repeat testing.  While 2019 novel coronavirus  (SARS-CoV-2) nucleic acids may be present in the submitted sample  additional confirmatory testing may be necessary for epidemiological  and / or clinical management purposes  to differentiate between  SARS-CoV-2 and other Sarbecovirus currently known to infect humans.  If clinically indicated additional testing with an alternate test  methodology [phone]) is advised. The SARS-CoV-2 RNA is generally  detectable in upper and lower respiratory sp ecimens during the acute  phase of infection. The expected result is Negative. Fact Sheet for Patients:  BoilerBrush.com.cy Fact Sheet for Healthcare Providers: https://pope.com/ This test is not yet approved or cleared by the [place] FDA and has been authorized for detection and/or diagnosis of SARS-CoV-2 by FDA under an Emergency Use Authorization (EUA).  This EUA will remain in effect (meaning this test can be used) for the duration of the COVID-19 declaration under Section 564(b)(1) of the Act, 21 U.S.C. section 360bbb-3(b)(1), unless the authorization is terminated or revoked sooner.  Performed at [facility], [address], [place], [place] [address]   Comprehensive metabolic panel     Status: Abnormal   Collection Time: 12/20/18  3:49 PM  Result Value Ref Range   Sodium 135 135 - 145 mmol/L   Potassium 3.8 3.5 - 5.1  mmol/L   Chloride 104 98 - 111 mmol/L   CO2 22 22 - 32 mmol/L   Glucose, Bld 219 (H) 70 - 99 mg/dL   BUN 17 8 - 23 mg/dL   Creatinine, Ser 9.56 0.61 - 1.24 mg/dL   Calcium 8.3 (L) 8.9 - 10.3 mg/dL   Total Protein 6.5 6.5 - 8.1 g/dL   Albumin 3.7 3.5 - 5.0 g/dL   AST 52 (H) 15 - 41 U/L   ALT 43 0 - 44 U/L   Alkaline Phosphatase 197 (H) 38 - 126 U/L   Total Bilirubin 3.6 (H) 0.3 - 1.2 mg/dL   GFR calc non Af Amer >60 >60 mL/min   GFR calc Af Amer >60 >60 mL/min   Anion gap 9 5 - 15    Comment: Performed at [facility], [address], [place], [place] [address]  CBC     Status: Abnormal   Collection Time: 12/20/18  3:49 PM  Result Value Ref Range   WBC 1.0 (LL) 4.0 - 10.5 K/uL    Comment: REPEATED TO VERIFY WHITE COUNT CONFIRMED ON SMEAR THIS CRITICAL RESULT HAS VERIFIED AND BEEN CALLED TO [facility] BY LATISHA HENDERSON ON 06 20 2020 AT 1620, AND HAS BEEN READ BACK. CRITICAL RESULT VERIFIED    RBC 4.91 4.22 - 5.81 MIL/uL   Hemoglobin 14.0 13.0 - 17.0 g/dL   HCT 65.7 84.6 - 96.2 %   MCV 82.7 80.0 - 100.0 fL   MCH 28.5 26.0 - 34.0 pg   MCHC 34.5 30.0 - 36.0 g/dL   RDW 95.2 84.1 - 32.4 %   Platelets 69 (L) 150 - 400 K/uL    Comment: REPEATED TO VERIFY PLATELET COUNT CONFIRMED BY SMEAR SPECIMEN CHECKED FOR CLOTS    nRBC 0.0 0.0 - 0.2 %    Comment: Performed at [facility], [address] [place], [place] [address]  Troponin I - Once     Status: None   Collection Time: 12/20/18  3:49 PM  Result Value Ref Range   Troponin I <0.03 <0.03 ng/mL    Comment: Performed at [facility], [address] [place], [place] [address]  D-dimer, quantitative (not at [facility])     Status: Abnormal   Collection Time: 12/20/18  3:49 PM  Result Value Ref Range   D-Dimer, Quant 1.21 (H) 0.00 - 0.50 ug/mL-FEU    Comment: (NOTE) At the manufacturer cut-off of 0.50 ug/mL FEU, this assay has been documented to exclude PE with a sensitivity and negative predictive value of 97 to 99%.  At this time,  this assay has not been approved by the FDA to exclude DVT/VTE. Results should be correlated with clinical presentation. Performed at [facility], [address], [place], [place] [address]   Differential     Status: Abnormal   Collection Time: 12/20/18  3:49 PM  Result Value Ref Range   Neutrophils Relative % 49 %   Neutro Abs 0.5 (L) 1.7 - 7.7 K/uL   Lymphocytes Relative 26 %   Lymphs Abs 0.2 (L) 0.7 - 4.0 K/uL   Monocytes Relative 24 %   Monocytes Absolute 0.2 0.1 - 1.0 K/uL   Eosinophils Relative 1 %  Eosinophils Absolute 0.0 0.0 - 0.5 K/uL   Basophils Relative 0 %   Basophils Absolute 0.0 0.0 - 0.1 K/uL   Immature Granulocytes 0 %   Abs Immature Granulocytes 0.00 0.00 - 0.07 K/uL    Comment: Performed at [facility], [address] [place], [place] [address]  Lactic acid, plasma     Status: None   Collection Time: 12/20/18  5:23 PM  Result Value Ref Range   Lactic Acid, Venous 1.0 0.5 - 1.9 mmol/L    Comment: Performed at [facility], [address] [place], [place] [address]  Blood Culture (routine x 2)     Status: None (Preliminary result)   Collection Time: 12/20/18  5:23 PM   Specimen: Blood  Result Value Ref Range   Specimen Description RIGHT ANTECUBITAL    Special Requests      BOTTLES DRAWN AEROBIC AND ANAEROBIC Blood Culture adequate volume Performed at [facility], [address], [place], [place] [address]    Culture PENDING    Report Status PENDING   Blood Culture (routine x 2)     Status: None (Preliminary result)   Collection Time: 12/20/18  5:23 PM   Specimen: Blood  Result Value Ref Range   Specimen Description BLOOD RIGHT ARM    Special Requests      BOTTLES DRAWN AEROBIC AND ANAEROBIC Blood Culture adequate volume Performed at [facility], [address], [place], [place] [address]    Culture PENDING    Report Status PENDING     Chemistries  Recent Labs  Lab 12/20/18 1549  NA 135  K 3.8  CL 104  CO2 22  GLUCOSE 219*  BUN 17    CREATININE 0.82  CALCIUM 8.3*  AST 52*  ALT 43  ALKPHOS 197*  BILITOT 3.6*   ------------------------------------------------------------------------------------------------------------------  ------------------------------------------------------------------------------------------------------------------ GFR: Estimated Creatinine Clearance: 99.9 mL/min (by C-G formula based on SCr of 0.82 mg/dL). Liver Function Tests: Recent Labs  Lab 12/20/18 1549  AST 52*  ALT 43  ALKPHOS 197*  BILITOT 3.6*  PROT 6.5  ALBUMIN 3.7   No results for input(s): LIPASE, AMYLASE in the last 168 hours. No results for input(s): AMMONIA in the last 168 hours. Coagulation Profile: No results for input(s): INR, PROTIME in the last 168 hours. Cardiac Enzymes: Recent Labs  Lab 12/20/18 1549  TROPONINI <0.03   BNP (last 3 results) No results for input(s): PROBNP in the last 8760 hours. HbA1C: No results for input(s): HGBA1C in the last 72 hours. CBG: No results for input(s): GLUCAP in the last 168 hours. Lipid Profile: No results for input(s): CHOL, HDL, LDLCALC, TRIG, CHOLHDL, LDLDIRECT in the last 72 hours. Thyroid Function Tests: No results for input(s): TSH, T4TOTAL, FREET4, T3FREE, THYROIDAB in the last 72 hours. Anemia Panel: No results for input(s): VITAMINB12, FOLATE, FERRITIN, TIBC, IRON, RETICCTPCT in the last 72 hours.  --------------------------------------------------------------------------------------------------------------- Urine analysis:    Component Value Date/Time   COLORURINE COLORLESS (A) 05/13/2013 0930   APPEARANCEUR CLEAR 05/13/2013 0930   LABSPEC 1.003 (L) 05/13/2013 0930   PHURINE 6.5 05/13/2013 0930   GLUCOSEU NEGATIVE 05/13/2013 0930   HGBUR NEGATIVE 05/13/2013 0930   BILIRUBINUR NEGATIVE 05/13/2013 0930   KETONESUR NEGATIVE 05/13/2013 0930   PROTEINUR NEGATIVE 05/13/2013 0930   UROBILINOGEN 0.2 05/13/2013 0930   NITRITE NEGATIVE 05/13/2013 0930    LEUKOCYTESUR NEGATIVE 05/13/2013 0930      Imaging Results:    Ct Angio Chest Pe W/cm &/or Wo Cm  Result Date: 12/20/2018 CLINICAL  DATA:  Onset left chest pain today after cutting grass. Pain is worse with inspiration. EXAM: CT ANGIOGRAPHY CHEST WITH CONTRAST TECHNIQUE: Multidetector CT imaging of the chest was performed using the standard protocol during bolus administration of intravenous contrast. Multiplanar CT image reconstructions and MIPs were obtained to evaluate the vascular anatomy. CONTRAST:  75 mL OMNIPAQUE IOHEXOL 350 MG/ML SOLN COMPARISON:  Single view of the chest earlier today. CT chest 03/04/2016. FINDINGS: Cardiovascular: Satisfactory opacification of the pulmonary arteries to the segmental level. No evidence of pulmonary embolism. Normal heart size. No pericardial effusion. Mediastinum/Nodes: No enlarged mediastinal, hilar, or axillary lymph nodes. Thyroid gland, trachea, and esophagus demonstrate no significant findings. Lungs/Pleura: No pleural effusion. Mild dependent atelectasis. No consolidative process or nodule. Upper Abdomen: They are is partial visualization of marked splenomegaly. The spleen measures approximately 18 cm AP x 8.7 cm transverse compared 18 cm x 7.8 cm transverse at approximately the same level on the prior CT. Imaged upper abdomen is otherwise negative. Musculoskeletal: No acute or focal bony abnormality. Review of the MIP images confirms the above findings. IMPRESSION: Negative for pulmonary embolus or acute cardiopulmonary disease. Marked splenomegaly appears slightly worse than on the prior CT. Electronically Signed   By: Drusilla Kanner M.D.   On: 12/20/2018 19:21   Dg Chest Port 1 View  Result Date: 12/20/2018 CLINICAL DATA:  Chest pain and shortness of breath. No known injury. Worse with movement and deep inspiration. EXAM: PORTABLE CHEST 1 VIEW COMPARISON:  04/01/2016; chest CT-03/04/2016 FINDINGS: Grossly unchanged borderline enlarged cardiac  silhouette and mediastinal contours given persistently reduced lung volumes. Mild diffuse nodular thickening of the pulmonary interstitium. No discrete focal airspace opacities. No pleural effusion or pneumothorax. No evidence of edema. No acute osseus abnormalities. IMPRESSION: Suspected mild airways disease / bronchitis on this AP portable examination. No focal airspace opacities to suggest pneumonia. Further evaluation with a PA and lateral chest radiograph may be obtained as clinically indicated. Electronically Signed   By: Simonne Come M.D.   On: 12/20/2018 16:23       Assessment & Plan:    Principal Problem:   Atrial fibrillation with RVR (HCC) Active Problems:   Portal hypertension (HCC)   Hyperglycemia   Thrombocytopenia (HCC)   Abnormal liver function   NASH (nonalcoholic steatohepatitis)  New onset Afib with RVR=> converted to sinus w cardizem -Chadsvasc =1 (unclear if he has diabetes, highest hga1c=6.3, but taking glipizide previously), problem list has CHF listed, but cant find evidence of CHF -H/o prior cardiac arrest while in OR with EF 30-35% (02/22/2016) -L heart cath 02/24/2016=> normal coronaries Tele Check Trop I q6h x3, TSH Check cardiac echo cardizem GTT No anticoagulation for now due to thrombocytopenia/ eosphageal varicies Pt intolerant to aspirin Cardiology consult requested, pt has been put on list for AM consult by fellow, appreciate input  LUQ pain vs L lower chest pain  ? Due to splenomegaly Tele Trop I q6h x3 Check echo Check abdominal ultrasound with doppler Consider oncology consult  Gerd/ Barretts esophagus, esophageal varices Cont PPI  Abnormal LFT, secondary to cirrhosis/NASH Check ggt due to elevation in Alkaline Phosphatase Check cmp in am  Leukopenia/ Thrombocytopenia  Doesn't seem septic, pt received abx initially in ED Consider asking about tick bites, if has had in recent past please check for erhlichiosis Add diff Neutropenic  precautions Repeat cbc in am  DVT Prophylaxis-   SCDs   AM Labs Ordered, also please review Full Orders  Family Communication: Admission, patients condition and  plan of care including tests being ordered have been discussed with the patient who indicate understanding and agree with the plan and Code Status.  Code Status:  FULL CODE  Admission status: Observation: Based on patients clinical presentation and evaluation of above clinical data, I have made determination that patient meets observation criteria at this time. Pt has high risk of clinical deterioration, has Afib with RVR requiring cardizem GTT, pt has critically low wbc, pt will be admitted for evaluation of Afib with RVR  Time spent in minutes : critical care 45 minutes   Pearson Grippe M.D on 12/20/2018 at 8:24 PM

## 2018-12-20 NOTE — ED Notes (Signed)
Unable to obtain bloodwork at this time. Starting IV antibiotics due to inability to obtain blood.

## 2018-12-21 ENCOUNTER — Observation Stay (HOSPITAL_COMMUNITY): Payer: PPO

## 2018-12-21 ENCOUNTER — Observation Stay (HOSPITAL_BASED_OUTPATIENT_CLINIC_OR_DEPARTMENT_OTHER): Payer: PPO

## 2018-12-21 DIAGNOSIS — I4891 Unspecified atrial fibrillation: Secondary | ICD-10-CM | POA: Diagnosis not present

## 2018-12-21 DIAGNOSIS — K7581 Nonalcoholic steatohepatitis (NASH): Secondary | ICD-10-CM | POA: Diagnosis not present

## 2018-12-21 DIAGNOSIS — R945 Abnormal results of liver function studies: Secondary | ICD-10-CM | POA: Diagnosis not present

## 2018-12-21 DIAGNOSIS — K766 Portal hypertension: Secondary | ICD-10-CM | POA: Diagnosis not present

## 2018-12-21 DIAGNOSIS — D708 Other neutropenia: Secondary | ICD-10-CM | POA: Diagnosis not present

## 2018-12-21 DIAGNOSIS — R1012 Left upper quadrant pain: Secondary | ICD-10-CM | POA: Diagnosis not present

## 2018-12-21 DIAGNOSIS — D696 Thrombocytopenia, unspecified: Secondary | ICD-10-CM | POA: Diagnosis not present

## 2018-12-21 DIAGNOSIS — R161 Splenomegaly, not elsewhere classified: Secondary | ICD-10-CM | POA: Diagnosis not present

## 2018-12-21 LAB — GAMMA GT: GGT: 107 U/L — ABNORMAL HIGH (ref 7–50)

## 2018-12-21 LAB — GLUCOSE, CAPILLARY
Glucose-Capillary: 102 mg/dL — ABNORMAL HIGH (ref 70–99)
Glucose-Capillary: 103 mg/dL — ABNORMAL HIGH (ref 70–99)
Glucose-Capillary: 84 mg/dL (ref 70–99)
Glucose-Capillary: 97 mg/dL (ref 70–99)

## 2018-12-21 LAB — RAPID URINE DRUG SCREEN, HOSP PERFORMED
Amphetamines: NOT DETECTED
Barbiturates: NOT DETECTED
Benzodiazepines: NOT DETECTED
Cocaine: NOT DETECTED
Opiates: NOT DETECTED
Tetrahydrocannabinol: NOT DETECTED

## 2018-12-21 LAB — T4, FREE: Free T4: 1.56 ng/dL — ABNORMAL HIGH (ref 0.61–1.12)

## 2018-12-21 LAB — ECHOCARDIOGRAM COMPLETE
Height: 72 in
Weight: 3099.2 oz

## 2018-12-21 LAB — TSH: TSH: <0.01 u[IU]/mL — ABNORMAL LOW (ref 0.350–4.500)

## 2018-12-21 LAB — LACTATE DEHYDROGENASE: LDH: 278 U/L — ABNORMAL HIGH (ref 98–192)

## 2018-12-21 LAB — NOVEL CORONAVIRUS, NAA: SARS-CoV-2, NAA: NOT DETECTED

## 2018-12-21 MED ORDER — PANTOPRAZOLE SODIUM 40 MG PO TBEC
40.0000 mg | DELAYED_RELEASE_TABLET | Freq: Every day | ORAL | Status: DC
  Administered 2018-12-21: 10:00:00 40 mg via ORAL
  Filled 2018-12-21: qty 1

## 2018-12-21 MED ORDER — CARVEDILOL 3.125 MG PO TABS: 3.1250 mg | ORAL_TABLET | Freq: Two times a day (BID) | ORAL | Status: DC

## 2018-12-21 MED ORDER — METOPROLOL TARTRATE 25 MG PO TABS: 12.5000 mg | ORAL_TABLET | Freq: Two times a day (BID) | ORAL | 0 refills | Status: DC

## 2018-12-21 MED ORDER — METOPROLOL TARTRATE 12.5 MG HALF TABLET
12.5000 mg | ORAL_TABLET | Freq: Two times a day (BID) | ORAL | Status: DC
  Administered 2018-12-21: 10:00:00 12.5 mg via ORAL
  Filled 2018-12-21: qty 1

## 2018-12-21 NOTE — Discharge Summary (Signed)
Physician Discharge Summary  Patient ID: Stephen Burch MRN: [id number] DOB/AGE: [birth year] 63 y.o.  Admit date: 12/20/2018 Discharge date: 12/21/2018  Admission Diagnoses:  Discharge Diagnoses:  Principal Problem:   Atrial fibrillation with RVR (HCC) Active Problems:   Portal hypertension (HCC)   Hyperglycemia   Thrombocytopenia (HCC)   Abnormal liver function   NASH (nonalcoholic steatohepatitis)   Other neutropenia (HCC)   Discharged Condition: stable  Hospital Course: Stephen Burch  is a  89 year old Caucasian male with past medical history significant for liver cirrhosis secondary to [place], portal hypertension, chronic thrombocytopenia and leukopenia, GERD, Barrett's esophagus, esophageal stricture, and deep venous thrombosis.  Patient was admitted with left upper quadrant and left lower chest pain.  Chest pain is atypical.  Patient has moderate to massive splenomegaly secondary to the liver disease that has been causing chronic left upper quadrant pain.  Patient was admitted because of new onset atrial fibrillation with rapid ventricular response.  CHADSVASC score is 1.  Patient was admitted for further assessment and management.  Cardiology team was consulted.  Patient was seen by Dr. Rennis Golden.  Dr. Rennis Golden has not recommended anticoagulation.  Patient is back to normal sinus rhythm.  Patient be discharged on low-dose beta-blocker.  Due to significant neutropenia, hematology team was consulted, Dr. Clelia Croft.  Dr. Clelia Croft has advised that patient can be followed up on outpatient basis.  Dr. Clelia Croft has cleared patient for discharge.  Consults: cardiology and hematology/oncology  Significant Diagnostic Studies: labs: CBC prior to discharge revealed WBC of 1, hemoglobin 14, hematocrit of 40.6, MCV of 82.7 with platelet count of 69.  Troponin was less than 0.03.  Liver function tests reveals AST of 52, ALT of 43, total protein of 6.5, albumin of 3.7, alkaline phosphatase of 197, total bilirubin of  3.6 with GGT of 107.  Discharge Exam: Blood pressure (!) 104/54, pulse 73, temperature 97.7 F (36.5 C), temperature source Axillary, resp. rate 17, height 6' (1.829 m), weight 87.9 kg, SpO2 96 %.  Disposition: Home.  Follow-up with your primary care provider, gastroenterology, cardiology and hematology within 1 week of discharge.  Discharge Instructions    Diet - low sodium heart healthy   Complete by: As directed    Increase activity slowly   Complete by: As directed      Allergies as of 12/21/2018      Reactions   Asa [aspirin] Shortness Of Breath   "asthma symptoms"   Penicillins Shortness Of Breath   Has patient had a PCN reaction causing immediate rash, facial/tongue/throat swelling, SOB or lightheadedness with hypotension: Yes Has patient had a PCN reaction causing severe rash involving mucus membranes or skin necrosis: No Has patient had a PCN reaction that required hospitalization No Has patient had a PCN reaction occurring within the last 10 years: No If all of the above answers are "NO", then may proceed with Cephalosporin use. "asthma symptoms" Patient has tolerated cefazolin, ceftriaxo   Vancomycin Anaphylaxis   Immediately following being turned into prone position and Vancomycin administration in the OR patient cardiac arrest w/  Vfib.      Medication List    STOP taking these medications   fluticasone 50 MCG/ACT nasal spray Commonly known as: FLONASE   glipiZIDE 2.5 MG 24 hr tablet Commonly known as: GLUCOTROL XL   pantoprazole 40 MG tablet Commonly known as: PROTONIX     TAKE these medications   metoprolol tartrate 25 MG tablet Commonly known as: LOPRESSOR Take 0.5 tablets (12.5 mg total) by  mouth 2 (two) times daily.        SignedBarnetta Chapel 12/21/2018, 12:24 PM

## 2018-12-21 NOTE — Progress Notes (Signed)
I was asked to comment about Stephen Burch's decrease in white cell count.  He had fluctuation in his white cell count previously likely to splenic sequestration due to portal hypertension.  Etiology of his leukocytopenia is the same as his thrombocytopenia which is related to worsening sequestration.  No hematological intervention is needed at this time.  He does not require further hospitalization at this time and follow-up of his white cell count can be done as an outpatient.  Please call with any questions regarding this patient.

## 2018-12-21 NOTE — Progress Notes (Signed)
  Echocardiogram 2D Echocardiogram has been performed.  Burnett Kanaris 12/21/2018, 9:34 AM

## 2018-12-21 NOTE — Consult Note (Signed)
Cardiology Consultation:   Patient ID: Stephen Burch MRN: [id number]; DOB: Oct 05, 1955  Admit date: 12/20/2018 Date of Consult: 12/21/2018  Primary Care Provider: Malva Limes, MD Primary Cardiologist: Kristeen Miss, MD  Primary Electrophysiologist:  None    Patient Profile:   Stephen Burch is a 63 y.o. male with a hx of arthritis s/p total knee replacement, GERD, Barretts esophagus, esophageal stricture with esophageal varices, non ETOH cirrhosis, thrombocytopenia, DVT, asthma and VFib cardiac arrest following surgical removal of Baker's Cyst in 01/2016, EF 30-35% in 2017 who is being seen today for the evaluation of new atrial fibrilation at the request of Dr. Dartha Lodge.  History of Present Illness:   Stephen Burch has a past medical history significant for Post-op STelevation>>hypoxia/hypotension>>pulseless>>CPR>>Epi>>Vfib>>shock x 2>>pulses in 2017. EF 35% by echo (no previous). LHC on 02/24/2016 showed normal coronary arteries and normal LV function. No further follow up was felt to be needed. EF returned to normal, 55-60%, by echo done in 04/2016 by his PCP. TSH <0.010  He presented to [facility] on 12/20/18 with complaints of sharp chest pain that began while mowing his grass. He had had low grade fever and abdominal pain earlier in the week. COVID test was negative.  Abdominal CT showed marked splenomegaly and question of cirrhosis. D-Dimer 1.21. Chest CT negative for PE. EKG showed atrial fibrillation with rate of 155 bpm.   Troponins negative X2. TSH was depressed. No significant electrolyte abnormalities except elevated glucose. Abnormal LFTs. WBCs 1.0, Platelets 69k, Hgb 14.0.  Pt was transferred to [facility] for management of Afib and LUQ/left lower chest pain. He is currently on cardizem drip.   CHA2DS2/VAS Stroke Risk Score is 1 (for prior reduced EF). He was not started on anticoagulation yesterday due to thrombocytopenia and presence of esophageal varices. Notes  indicate that pt is intolerant to aspirin.     Upon my assessment the patient is in sinus rhythm since he arrived at [facility]: [facility]. He says that he was out mowing his lawn in the heat. He took breaks and tried to stay hydrated but says he did get very hot. He developed LUQ sharp pain very localized, just under his rib border. He has had no chest pain or shortness of breath. He denies palpitations and says he had no awareness of abnormal heart rhythm. He has never had palpitations. He says that is is very active, runs on treadmill, and never has exertional chest discomfort or shortness of breath.   Pt says that his cardiac arrest in 2017 was due to vancomycin infusion.    Past Medical History:  Diagnosis Date   Arthritis    knees, wrists   Barrett's esophagus    Complication of anesthesia    02-22-2016 intraop cardiac arrest immediately following moving pt into prone position and vancomyocin administration , pt cardiac arrest w/ Vfib,  please refer to anesthesia record in epic   Esophageal varices determined by endoscopy (HCC) 11/2016   grade 1   GERD (gastroesophageal reflux disease)    Hiatal hernia    History of adenomatous polyp of colon    History of asthma    as child   History of DVT (deep vein thrombosis)    right lower extremitty post-op right total knee surgery 09/ 2010,  treated w/ coumadin   History of esophageal dilatation 07/1998   for schatzski ring   History of kidney stones    History of staph infection 04/2016   MSSA infection of right total  knee w/ sepsis   Hx of cardiac arrest    02-22-2016  intraoperative, immediately following moving pt into prone position and vancomyocin administration, cardiac arrest w/ Vfib (referred to anesthesia record in epic) pt extubated himself next day   Liver cirrhosis secondary to NASH (nonalcoholic steatohepatitis) (HCC)    NAFLD-- followed by dr Karilyn Cota---  liver bx 09-30-2012  mild portal and focal sinusoidal fibrosis    Scapholunate advanced collapse of left wrist    deformity   Thrombocytopenia (HCC)    10-09-2017 per pt his platelets have always been low , followed by pcp, never been referred to hematologist    Past Surgical History:  Procedure Laterality Date   APPLICATION OF WOUND VAC Right 02/22/2016   Procedure: APPLICATION OF WOUND VAC;  Surgeon: Dannielle Huh, MD;  Location: [facility] OR;  Service: Orthopedics;  Laterality: Right;   CARDIAC CATHETERIZATION N/A 02/24/2016   Procedure: Left Heart Cath and Coronary Angiography;  Surgeon: Kathleene Hazel, MD;  Location: [facility] INVASIVE CV LAB;  Service: Cardiovascular;  Laterality: N/A;  No angiographic evidence of CAD,  normal LVSF, ef 50-55%   CARPECTOMY Left 10/17/2017   Procedure: LEFT WRIST PROXIMAL ROW CARPECTOMY WITH POSTEROR IMBROSSEOUS NERVE EXCISION;  Surgeon: Dairl Ponder, MD;  Location: [facility];  Service: Orthopedics;  Laterality: Left;  AXILLARY BLOCK   EAR CYST EXCISION Right 09/06/2014   Procedure: OPEN EXCISION BAKER'S CYST RIGHT KNEE;  Surgeon: Dannielle Huh, MD;  Location: [facility] OR;  Service: Orthopedics;  Laterality: Right;   ESOPHAGEAL DILATION  1996/ 2000   ESOPHAGOGASTRODUODENOSCOPY N/A 09/17/2012   Procedure: ESOPHAGOGASTRODUODENOSCOPY (EGD);  Surgeon: Malissa Hippo, MD;  Location: [facility];  Service: Endoscopy;  Laterality: N/A;  200   ESOPHAGOGASTRODUODENOSCOPY N/A 12/15/2015   Procedure: ESOPHAGOGASTRODUODENOSCOPY (EGD);  Surgeon: Malissa Hippo, MD;  Location: [facility];  Service: Endoscopy;  Laterality: N/A;  1245   ESOPHAGOGASTRODUODENOSCOPY N/A 12/17/2016   Procedure: ESOPHAGOGASTRODUODENOSCOPY (EGD);  Surgeon: Malissa Hippo, MD;  [facility]: [facility];  Service: Endoscopy;  Laterality: N/A;  210   I&D KNEE WITH POLY EXCHANGE Right 04/02/2016   Procedure: IRRIGATION AND DEBRIDEMENT RIGHT KNEE WITH POLY EXCHANGE;  Surgeon: Dannielle Huh, MD;  Location: [facility];  Service: Orthopedics;  Laterality:  Right;   INCISION AND DRAINAGE HEMATOMA POST LEFT  TOTAL KNEE  2012   INGUINAL HERNIA REPAIR Bilateral 05/22/2013   Procedure: REPAIR OF RECURRENT INCARCERATED INGUINAL HERNIA WITH MESH RIGHT SIDE,  REPAIR OF RECURRENT INGUINAL HERNIA WITH MESH LEFT SIDE;  Surgeon: Ernestene Mention, MD;  Location: [facility];  Service: General;  Laterality: Bilateral;   INGUINAL HERNIA REPAIR Bilateral 11/1997   INSERTION OF MESH Bilateral 05/22/2013   Procedure: INSERTION OF MESH;  Surgeon: Ernestene Mention, MD;  Location: [facility];  Service: General;  Laterality: Bilateral;   IRRIGATION AND DEBRIDEMENT KNEE Right 02/22/2016   Procedure: IRRIGATION AND DEBRIDEMENT KNEE;  Surgeon: Dannielle Huh, MD;  Location: [facility];  Service: Orthopedics;  Laterality: Right;   Irrigation and Debridement right knee  03/23/2009   Dr. Hyacinth Meeker, [facility]   KNEE ARTHROSCOPY W/ SYNOVECTOMY Right 01/30/2010   AND DEBRIDEMENT OF HETEROTOPIC   LIVER BIOPSY  09/30/2012   REVISION TOTAL KNEE ARTHROPLASTY Left fall 2012   TONSILLECTOMY  child   TOTAL KNEE ARTHROPLASTY Bilateral right 09/ 2010/  left 09-18-2010   TRANSTHORACIC ECHOCARDIOGRAM  02/22/2016   ef 30-35% (per cardiac cath 02-24-2016 normal), severe hypokinesis of the mid-apicalanteroseptal and apical myocardium,  grade  1 diastolic dysfunction/ trivial PR and TR   WRIST ARTHROPLASTY Left 10/17/2017   Procedure: CAPITATE RESURFACING ARTHROPLASTY;  Surgeon: Dairl Ponder, MD;  Location: [facility];  Service: Orthopedics;  Laterality: Left;     Home Medications:  Prior to Admission medications   Medication Sig Start Date End Date Taking? Authorizing Provider  pantoprazole (PROTONIX) 40 MG tablet Take 1 tablet (40 mg total) by mouth daily. Patient taking differently: Take 40 mg by mouth every 3 (three) days.  07/15/18  Yes Rehman, Joline Maxcy, MD  fluticasone (FLONASE) 50 MCG/ACT nasal spray Place 2 sprays into both nostrils daily for 30 days. Patient not taking:  Reported on 12/20/2018 08/21/18 12/20/18  Malva Limes, MD  glipiZIDE (GLUCOTROL XL) 2.5 MG 24 hr tablet Take 1 tablet (2.5 mg total) by mouth daily with breakfast. Patient not taking: Reported on 12/20/2018 11/14/18   Malva Limes, MD    Inpatient Medications: Scheduled Meds:  insulin aspart  0-9 Units Subcutaneous Q4H   Continuous Infusions:  sodium chloride 75 mL/hr at 12/20/18 1705   ceFEPime (MAXIPIME) IV Stopped (12/21/18 0349)   diltiazem (CARDIZEM) infusion 5 mg/hr (12/20/18 2344)   PRN Meds: acetaminophen **OR** acetaminophen  Allergies:    Allergies  Allergen Reactions   Asa [Aspirin] Shortness Of Breath    "asthma symptoms"   Penicillins Shortness Of Breath    Has patient had a PCN reaction causing immediate rash, facial/tongue/throat swelling, SOB or lightheadedness with hypotension: Yes Has patient had a PCN reaction causing severe rash involving mucus membranes or skin necrosis: No Has patient had a PCN reaction that required hospitalization No Has patient had a PCN reaction occurring within the last 10 years: No If all of the above answers are "NO", then may proceed with Cephalosporin use. "asthma symptoms" Patient has tolerated cefazolin, ceftriaxo   Vancomycin Anaphylaxis    Immediately following being turned into prone position and Vancomycin administration in the OR patient cardiac arrest w/  Vfib.    Social History:   Social History   Socioeconomic History   Marital status: Legally Separated    Spouse name: Not on file   Number of children: 2   Years of education: Not on file   Highest education level: 12th grade  Occupational History   Occupation: Actuary: [employer]    Comment: part time  Ecologist strain: Not hard at all   Food insecurity    Worry: Never true    Inability: Never true   Transportation needs    Medical: No    Non-medical: No  Tobacco Use   Smoking status: Former  Smoker    Years: 3.00    Types: Cigarettes    Quit date: 10/10/1974    Years since quitting: 44.2   Smokeless tobacco: Never Used  Substance and Sexual Activity   Alcohol use: Not Currently   Drug use: No   Sexual activity: Not on file  Lifestyle   Physical activity    Days per week: Not on file    Minutes per session: Not on file   Stress: Not at all  Relationships   Social connections    Talks on phone: Not on file    Gets together: Not on file    Attends religious service: Not on file    Active member of club or organization: Not on file    Attends meetings of clubs or organizations: Not on file  Relationship status: Not on file   Intimate partner violence    Fear of current or ex partner: Not on file    Emotionally abused: Not on file    Physically abused: Not on file    Forced sexual activity: Not on file  Other Topics Concern   Not on file  Social History Narrative   ** Merged History Encounter **       Pt lives alone.    Family History:    Family History  Problem Relation Age of Onset   Diabetes Mother        type 2, born 73   Arthritis Father        died age 35   GI Bleed Father        upper GI Bleed, non-ETOH cirrhosis   Arthritis Brother    Diabetes Brother        type 2   Diabetes Paternal Uncle        type 2   Heart attack Maternal Grandmother    Heart attack Maternal Grandfather    Colon cancer Neg Hx    Colon polyps Neg Hx      ROS:  Please see the history of present illness.   All other ROS reviewed and negative.     Physical Exam/Data:   Vitals:   12/20/18 2147 12/20/18 2203 12/20/18 2253 12/21/18 0437  BP: 102/81 103/64 113/65 99/60  Pulse: 75 74 77 70  Resp: (!) 21 16 20 17   Temp:   100.2 F (37.9 C) 97.7 F (36.5 C)  TempSrc:   Oral Axillary  SpO2: 98%  97% 96%  Weight:    87.9 kg  Height:        Intake/Output Summary (Last 24 hours) at 12/21/2018 0808 Last data filed at 12/21/2018 0544 Gross per 24  hour  Intake 2036.38 ml  Output --  Net 2036.38 ml   Last 3 Weights 12/21/2018 12/20/2018 11/14/2018  Weight (lbs) 193 lb 11.2 oz 196 lb 199 lb  Weight (kg) 87.862 kg 88.905 kg 90.266 kg     Body mass index is 26.27 kg/m.  General:  Well nourished, well developed, in no acute distress HEENT: normal Lymph: no adenopathy Neck: no JVD Endocrine:  No thryomegaly Vascular: No carotid bruits; FA pulses 2+ bilaterally without bruits  Cardiac:  normal S1, S2; RRR; no murmur  Lungs:  clear to auscultation bilaterally, no wheezing, rhonchi or rales  Abd: soft, LUQ tenderness Ext: no edema Musculoskeletal:  No deformities, BUE and BLE strength normal and equal Skin: warm and dry  Neuro:  CNs 2-12 intact, no focal abnormalities noted Psych:  Normal affect   EKG:  The EKG was personally reviewed and demonstrates:  Atrial fibrillation , 159 bpm Telemetry:  Telemetry was personally reviewed and demonstrates:  Sinus rhythm 60's-70's  Relevant CV Studies:  Echocardiogram 04/04/2016 Study Conclusions - Left ventricle: The cavity size was normal. Wall thickness was   normal. Systolic function was normal. The estimated ejection   fraction was in the range of 55% to 60%. Wall motion was normal;   there were no regional wall motion abnormalities. Doppler   parameters are consistent with abnormal left ventricular   relaxation (grade 1 diastolic dysfunction).  Impressions: - Normal LV systolic function; probable mild diastolic dysfunction;   trace MR and TR.  Left Heart Cath and Coronary Angiography  02/24/2016  Conclusion  1. No angiographic evidence of CAD 2. Normal LV systolic function  Recommendations: No further  ischemic workup.    Echocardiogram 02/22/2016 Study Conclusions  - Left ventricle: The cavity size was normal. Wall thickness was   normal. Systolic function was moderately to severely reduced. The   estimated ejection fraction was in the range of 30% to 35%.   Severe  hypokinesis of the mid-apicalanteroseptal and apical   myocardium. Doppler parameters are consistent with abnormal left   ventricular relaxation (grade 1 diastolic dysfunction).   Laboratory Data:  Chemistry Recent Labs  Lab 12/20/18 1549  NA 135  K 3.8  CL 104  CO2 22  GLUCOSE 219*  BUN 17  CREATININE 0.82  CALCIUM 8.3*  GFRNONAA >60  GFRAA >60  ANIONGAP 9    Recent Labs  Lab 12/20/18 1549  PROT 6.5  ALBUMIN 3.7  AST 52*  ALT 43  ALKPHOS 197*  BILITOT 3.6*   Hematology Recent Labs  Lab 12/20/18 1549  WBC 1.0*  RBC 4.91  HGB 14.0  HCT 40.6  MCV 82.7  MCH 28.5  MCHC 34.5  RDW 13.9  PLT 69*   Cardiac Enzymes Recent Labs  Lab 12/20/18 1549 12/20/18 2112  TROPONINI <0.03 <0.03   No results for input(s): TROPIPOC in the last 168 hours.  BNPNo results for input(s): BNP, PROBNP in the last 168 hours.  DDimer  Recent Labs  Lab 12/20/18 1549  DDIMER 1.21*    Radiology/Studies:  Ct Abdomen Pelvis Wo Contrast  Result Date: 12/20/2018 CLINICAL DATA:  63 year old male with acute abdominal pain and fever. EXAM: CT ABDOMEN AND PELVIS WITHOUT CONTRAST TECHNIQUE: Multidetector CT imaging of the abdomen and pelvis was performed following the standard protocol without IV contrast. COMPARISON:  07/09/2018 ultrasound, 06/26/2016 MR and 06/27/2012 CT FINDINGS: Please note that parenchymal abnormalities may be missed without intravenous contrast. Lower chest: No acute abnormality Hepatobiliary: Hepatic configuration may suggest cirrhosis. No definite focal hepatic lesions are present. The gallbladder is unremarkable. No biliary dilatation. Pancreas: Unremarkable Spleen: Moderate to marked splenomegaly noted with splenic volume of 2000 cc, doubled in volume from 2017. no definite focal splenic abnormalities noted. Adrenals/Urinary Tract: Contrast within the renal collecting system and bladder from earlier chest CT today noted. The kidneys, adrenal glands and bladder are  otherwise unremarkable except for a small RIGHT renal cyst. Stomach/Bowel: Stomach is within normal limits. Appendix appears normal. No evidence of bowel wall thickening, distention, or inflammatory changes. Vascular/Lymphatic: Aortic atherosclerosis. No enlarged abdominal or pelvic lymph nodes. Reproductive: Prostate is unremarkable. Other: No ascites, pneumoperitoneum or focal collection. Musculoskeletal: No acute or suspicious bony abnormalities. IMPRESSION: 1. No evidence of acute abnormality 2. Moderate to marked splenomegaly with splenic volume of 2000 cc, doubled since 2017. 3. Question cirrhosis. 4.  Aortic Atherosclerosis (ICD10-I70.0). Electronically Signed   By: Harmon Pier M.D.   On: 12/20/2018 20:58   Ct Angio Chest Pe W/cm &/or Wo Cm  Result Date: 12/20/2018 CLINICAL DATA:  Onset left chest pain today after cutting grass. Pain is worse with inspiration. EXAM: CT ANGIOGRAPHY CHEST WITH CONTRAST TECHNIQUE: Multidetector CT imaging of the chest was performed using the standard protocol during bolus administration of intravenous contrast. Multiplanar CT image reconstructions and MIPs were obtained to evaluate the vascular anatomy. CONTRAST:  75 mL OMNIPAQUE IOHEXOL 350 MG/ML SOLN COMPARISON:  Single view of the chest earlier today. CT chest 03/04/2016. FINDINGS: Cardiovascular: Satisfactory opacification of the pulmonary arteries to the segmental level. No evidence of pulmonary embolism. Normal heart size. No pericardial effusion. Mediastinum/Nodes: No enlarged mediastinal, hilar, or axillary lymph nodes. Thyroid gland,  trachea, and esophagus demonstrate no significant findings. Lungs/Pleura: No pleural effusion. Mild dependent atelectasis. No consolidative process or nodule. Upper Abdomen: They are is partial visualization of marked splenomegaly. The spleen measures approximately 18 cm AP x 8.7 cm transverse compared 18 cm x 7.8 cm transverse at approximately the same level on the prior CT. Imaged  upper abdomen is otherwise negative. Musculoskeletal: No acute or focal bony abnormality. Review of the MIP images confirms the above findings. IMPRESSION: Negative for pulmonary embolus or acute cardiopulmonary disease. Marked splenomegaly appears slightly worse than on the prior CT. Electronically Signed   By: Drusilla Kanner M.D.   On: 12/20/2018 19:21   Dg Chest Port 1 View  Result Date: 12/20/2018 CLINICAL DATA:  Chest pain and shortness of breath. No known injury. Worse with movement and deep inspiration. EXAM: PORTABLE CHEST 1 VIEW COMPARISON:  04/01/2016; chest CT-03/04/2016 FINDINGS: Grossly unchanged borderline enlarged cardiac silhouette and mediastinal contours given persistently reduced lung volumes. Mild diffuse nodular thickening of the pulmonary interstitium. No discrete focal airspace opacities. No pleural effusion or pneumothorax. No evidence of edema. No acute osseus abnormalities. IMPRESSION: Suspected mild airways disease / bronchitis on this AP portable examination. No focal airspace opacities to suggest pneumonia. Further evaluation with a PA and lateral chest radiograph may be obtained as clinically indicated. Electronically Signed   By: Simonne Come M.D.   On: 12/20/2018 16:23   US Abdominal Pelvic Art/vent Flow Doppler Limited  Result Date: 12/21/2018 CLINICAL DATA:  Left upper quadrant pain. Rule out splenic vein thrombosis. EXAM: LIMITED ABDOMINAL ULTRASOUND DOPPLER ULTRASOUND OF SPLEEN TECHNIQUE: Transabdominal ultrasound examination of the spleen was performed. Color and duplex Doppler ultrasound was utilized to evaluate blood flow to the spleen. COMPARISON:  Noncontrast CT yesterday. FINDINGS: Spleen: Prominent splenomegaly seen on CT. Spleen measures 21.9 x 10.8 x 21.1 cm, volume 2625 cc. No focal abnormality. Doppler: Pulsed Doppler evaluation of the spleen demonstrates normal arterial and venous waveforms. Normal directional flow in the splenic vein. No evidence of splenic  vein thrombosis. IMPRESSION: 1. No evidence of splenic vein thrombosis. Splenic arterial and venous blood flow is seen. 2. Prominent splenomegaly as seen on CT. Electronically Signed   By: Narda Rutherford M.D.   On: 12/21/2018 01:43   US Spleen (abdomen Limited)  Result Date: 12/21/2018 CLINICAL DATA:  Left upper quadrant pain. Rule out splenic vein thrombosis. EXAM: LIMITED ABDOMINAL ULTRASOUND DOPPLER ULTRASOUND OF SPLEEN TECHNIQUE: Transabdominal ultrasound examination of the spleen was performed. Color and duplex Doppler ultrasound was utilized to evaluate blood flow to the spleen. COMPARISON:  Noncontrast CT yesterday. FINDINGS: Spleen: Prominent splenomegaly seen on CT. Spleen measures 21.9 x 10.8 x 21.1 cm, volume 2625 cc. No focal abnormality. Doppler: Pulsed Doppler evaluation of the spleen demonstrates normal arterial and venous waveforms. Normal directional flow in the splenic vein. No evidence of splenic vein thrombosis. IMPRESSION: 1. No evidence of splenic vein thrombosis. Splenic arterial and venous blood flow is seen. 2. Prominent splenomegaly as seen on CT. Electronically Signed   By: Narda Rutherford M.D.   On: 12/21/2018 01:43    Assessment and Plan:   1. New onset atrial fibrillation -Pt admitted with LUQ pain. Found to have marked splenomegaly.  Found to be in atrial fibrillation. -Depressed TSH. Other labs with leukopenia, thrombocytopenia, elevated LFTs -Currently on cardizem drip, was 15 mg/hr, now 5 mg/hr. Maintaining sinus rhythm now.  -CHA2DS2/VAS Stroke Risk Score is 1 (for prior reduced EF). He was not started on anticoagulation yesterday due  to thrombocytopenia and presence of esophageal varices. Notes indicate that pt is intolerant to aspirin.  -Pt would not be a candidate for DCCV as TEE is contraindicated with hx of esoph varices. Also not able to be anticoagulated at this time.  -Discussed with Dr. Rennis Golden- will stop cardizem drip and start low dose BB with carvedilol  3.125 mg considering his hx of cardiomyopathy. Repeat echo to make sure no recurrent cardiomyopathy.  -Correct thyroid function.   2. Hx of prior cardiac arrest -Occurred at end of surgical procedure in 2017. EF initially low but recovered to normal after a few months. Cardiac cath showed normal coronary arteries and LV function.   3. LUQ/left lower chest pain -Workup per primary team. May be related to splenomegaly noted on CT. Abd [place] also shows splenomegaly.  Considering oncology consult. Pt also has leukopenia.   4. GERD/Barretts esophagus -On PPI  5. Hyperthyroid -TSH <0.010. Will need further testing.  -Management per primary team.   6. Cirrhosis/NASH -Elevated alk phos. Investigation per primary team.   7. Thrombocytopenia -Hx of thrombocytopienia in the past. Platelets now 69k.  -per primary team      For questions or updates, please contact [facility] Please consult [email or web address] for contact info under     Signed, Berton Bon, NP  12/21/2018 8:08 AM

## 2018-12-22 LAB — HIV ANTIBODY (ROUTINE TESTING W REFLEX): HIV Screen 4th Generation wRfx: NONREACTIVE

## 2018-12-22 LAB — T3: T3, Total: 161 ng/dL (ref 71–180)

## 2018-12-24 ENCOUNTER — Ambulatory Visit (INDEPENDENT_AMBULATORY_CARE_PROVIDER_SITE_OTHER): Payer: PPO | Admitting: Internal Medicine

## 2018-12-24 ENCOUNTER — Encounter (INDEPENDENT_AMBULATORY_CARE_PROVIDER_SITE_OTHER): Payer: Self-pay | Admitting: Internal Medicine

## 2018-12-24 ENCOUNTER — Telehealth: Payer: Self-pay | Admitting: Physician Assistant

## 2018-12-24 ENCOUNTER — Telehealth (INDEPENDENT_AMBULATORY_CARE_PROVIDER_SITE_OTHER): Payer: Self-pay | Admitting: Internal Medicine

## 2018-12-24 ENCOUNTER — Other Ambulatory Visit: Payer: Self-pay

## 2018-12-24 ENCOUNTER — Telehealth: Payer: Self-pay

## 2018-12-24 VITALS — BP 149/77 | HR 88 | Temp 98.6°F | Ht 72.0 in | Wt 196.9 lb

## 2018-12-24 DIAGNOSIS — K760 Fatty (change of) liver, not elsewhere classified: Secondary | ICD-10-CM | POA: Diagnosis not present

## 2018-12-24 NOTE — Telephone Encounter (Signed)
Work note sent.

## 2018-12-24 NOTE — Telephone Encounter (Signed)
Discussed negative COVID test with patient. However, reviewing his recent hospitalization and his WBC count/splenomegaly, he is technically immunocompromised with a WBC count of 1. I don't think it is safe to go back to work at [employer] currently. He has an appointment with GI today who may be able to repeat labwork and oncology on 12/31/2018.

## 2018-12-24 NOTE — Progress Notes (Signed)
Subjective:    Patient ID: Stephen Burch, male    DOB: 1956/06/10, 63 y.o.   MRN: [id number]  HPI Here today for f/u. Recently seen in the ED 12/20/2018. Hx of non-alcoholic cirrhosis.  Admitted with LUQ pain, chest pain. New onset of atrial fib. Also has hx of thrombocytopenia, leukopenia. Hx of esophageal varices.  States day of admission, he had mowed yard. Laid down and started having LUQ pain. When he took a deep breath, he had a sharp, stabbing pain.   Had telephone OV with Osvaldo Angst PA-C on 12/18/2018 for fever 100 and diarrhea.    12/21/2018 US Spleen: IMPRESSION: 1. No evidence of splenic vein thrombosis. Splenic arterial and venous blood flow is seen. 2. Prominent splenomegaly as seen on CT.   12/21/2018 [place] abd/pelvis Art/vent flow doppler:  LUQ pain. Rule out splenic vein thrombosis.  IMPRESSION: 1. No evidence of splenic vein thrombosis. Splenic arterial and venous blood flow is seen. 2. Prominent splenomegaly as seen on CT.  12/20/2018 CT abdomen/pelvis with CM: acute abdominal pain, fever IMPRESSION: 1. No evidence of acute abnormality 2. Moderate to marked splenomegaly with splenic volume of 2000 cc, doubled since 2017. 3. Question cirrhosis. 4.  Aortic Atherosclerosis (ICD10-I70.0).  07/09/2018 S complete; cirrhosis  IMPRESSION: Echogenic liver which could be due to fatty infiltration or cirrhosis.  Associated splenomegaly slightly increased from previous exam. Calculated volume 1.953 Suspected mild medical renal disease changes of both kidneys.  Hepatic Function Latest Ref Rng & Units 12/20/2018 09/15/2018 07/16/2018  Total Protein 6.5 - 8.1 g/dL 6.5 6.1 6.9(G)  Albumin 3.5 - 5.0 g/dL 3.7 - -  AST 15 - 41 U/L 52(H) 26 26  ALT 0 - 44 U/L 43 26 29  Alk Phosphatase 38 - 126 U/L 197(H) - -  Total Bilirubin 0.3 - 1.2 mg/dL 3.6(H) 1.5(H) 1.6(H)  Bilirubin, Direct 0.0 - 0.2 mg/dL - 2.9(B) -   CBC    Component Value Date/Time   WBC 1.0 (LL) 12/20/2018  1549   RBC 4.91 12/20/2018 1549   HGB 14.0 12/20/2018 1549   HGB 16.0 01/24/2017 0849   HCT 40.6 12/20/2018 1549   HCT 45.9 01/24/2017 0849   PLT 69 (L) 12/20/2018 1549   PLT 82 (LL) 01/24/2017 0849   MCV 82.7 12/20/2018 1549   MCV 86 01/24/2017 0849   MCH 28.5 12/20/2018 1549   MCHC 34.5 12/20/2018 1549   RDW 13.9 12/20/2018 1549   RDW 14.3 01/24/2017 0849   LYMPHSABS 0.2 (L) 12/20/2018 1549   MONOABS 0.2 12/20/2018 1549   EOSABS 0.0 12/20/2018 1549   BASOSABS 0.0 12/20/2018 1549      Wt 88.9 kg  CTA chest IMPRESSION: Negative for pulmonary embolus or acute cardiopulmonary disease. Marked splenomegaly appears slightly worse than on the prior CT.  CT abd/ pelvis IMPRESSION: 1. No evidence of acute abnormality 2. Moderate to marked splenomegaly with splenic volume of 2000 cc, doubled since 2017. 3. Question cirrhosis. 4. Aortic Atherosclerosis (ICD10-I70.0).    Review of Systems Past Medical History:  Diagnosis Date  . Arthritis    knees, wrists  . Barrett's esophagus   . Complication of anesthesia    02-22-2016 intraop cardiac arrest immediately following moving pt into prone position and vancomyocin administration , pt cardiac arrest w/ Vfib,  please refer to anesthesia record in epic  . Esophageal varices determined by endoscopy (HCC) 11/2016   grade 1  . GERD (gastroesophageal reflux disease)   . Hiatal hernia   .  History of adenomatous polyp of colon   . History of asthma    as child  . History of DVT (deep vein thrombosis)    right lower extremitty post-op right total knee surgery 09/ 2010,  treated w/ coumadin  . History of esophageal dilatation 07/1998   for schatzski ring  . History of kidney stones   . History of staph infection 04/2016   MSSA infection of right total knee w/ sepsis  . Hx of cardiac arrest    02-22-2016  intraoperative, immediately following moving pt into prone position and vancomyocin administration, cardiac arrest w/ Vfib  (referred to anesthesia record in epic) pt extubated himself next day  . Liver cirrhosis secondary to NASH (nonalcoholic steatohepatitis) (HCC)    NAFLD-- followed by dr Karilyn Cota---  liver bx 09-30-2012  mild portal and focal sinusoidal fibrosis  . Scapholunate advanced collapse of left wrist    deformity  . Thrombocytopenia (HCC)    10-09-2017 per pt his platelets have always been low , followed by pcp, never been referred to hematologist    Past Surgical History:  Procedure Laterality Date  . APPLICATION OF WOUND VAC Right 02/22/2016   Procedure: APPLICATION OF WOUND VAC;  Surgeon: Dannielle Huh, MD;  Location: [facility] OR;  Service: Orthopedics;  Laterality: Right;  . CARDIAC CATHETERIZATION N/A 02/24/2016   Procedure: Left Heart Cath and Coronary Angiography;  Surgeon: Kathleene Hazel, MD;  Location: [facility] INVASIVE CV LAB;  Service: Cardiovascular;  Laterality: N/A;  No angiographic evidence of CAD,  normal LVSF, ef 50-55%  . CARPECTOMY Left 10/17/2017   Procedure: LEFT WRIST PROXIMAL ROW CARPECTOMY WITH POSTEROR IMBROSSEOUS NERVE EXCISION;  Surgeon: Dairl Ponder, MD;  Location: [facility];  Service: Orthopedics;  Laterality: Left;  AXILLARY BLOCK  . EAR CYST EXCISION Right 09/06/2014   Procedure: OPEN EXCISION BAKER'S CYST RIGHT KNEE;  Surgeon: Dannielle Huh, MD;  Location: [facility] OR;  Service: Orthopedics;  Laterality: Right;  . ESOPHAGEAL DILATION  1996/ 2000  . ESOPHAGOGASTRODUODENOSCOPY N/A 09/17/2012   Procedure: ESOPHAGOGASTRODUODENOSCOPY (EGD);  Surgeon: Malissa Hippo, MD;  Location: [facility] END[facility];  Service: Endoscopy;  Laterality: N/A;  200  . ESOPHAGOGASTRODUODENOSCOPY N/A 12/15/2015   Procedure: ESOPHAGOGASTRODUODENOSCOPY (EGD);  Surgeon: Malissa Hippo, MD;  [facility]: [facility];  Service: Endoscopy;  Laterality: N/A;  1245  . ESOPHAGOGASTRODUODENOSCOPY N/A 12/17/2016   Procedure: ESOPHAGOGASTRODUODENOSCOPY (EGD);  Surgeon: Malissa Hippo, MD;  [facility];  Service: Endoscopy;  Laterality: N/A;  210  . I&D KNEE WITH POLY EXCHANGE Right 04/02/2016   Procedure: IRRIGATION AND DEBRIDEMENT RIGHT KNEE WITH POLY EXCHANGE;  Surgeon: Dannielle Huh, MD;  Location: [facility] OR;  Service: Orthopedics;  Laterality: Right;  . INCISION AND DRAINAGE HEMATOMA POST LEFT  TOTAL KNEE  2012  . INGUINAL HERNIA REPAIR Bilateral 05/22/2013   Procedure: REPAIR OF RECURRENT INCARCERATED INGUINAL HERNIA WITH MESH RIGHT SIDE,  REPAIR OF RECURRENT INGUINAL HERNIA WITH MESH LEFT SIDE;  Surgeon: Ernestene Mention, MD;  Location: [facility] OR;  Service: General;  Laterality: Bilateral;  . INGUINAL HERNIA REPAIR Bilateral 11/1997  . INSERTION OF MESH Bilateral 05/22/2013   Procedure: INSERTION OF MESH;  Surgeon: Ernestene Mention, MD;  Location: [facility] OR;  Service: General;  Laterality: Bilateral;  . IRRIGATION AND DEBRIDEMENT KNEE Right 02/22/2016   Procedure: IRRIGATION AND DEBRIDEMENT KNEE;  Surgeon: Dannielle Huh, MD;  Location: [facility] OR;  Service: Orthopedics;  Laterality: Right;  . Irrigation and Debridement right knee  03/23/2009  Dr. Hyacinth Meeker, [facility]  . KNEE ARTHROSCOPY W/ SYNOVECTOMY Right 01/30/2010   AND DEBRIDEMENT OF HETEROTOPIC  . LIVER BIOPSY  09/30/2012  . REVISION TOTAL KNEE ARTHROPLASTY Left fall 2012  . TONSILLECTOMY  child  . TOTAL KNEE ARTHROPLASTY Bilateral right 09/ 2010/  left 09-18-2010  . TRANSTHORACIC ECHOCARDIOGRAM  02/22/2016   ef 30-35% (per cardiac cath 02-24-2016 normal), severe hypokinesis of the mid-apicalanteroseptal and apical myocardium,  grade 1 diastolic dysfunction/ trivial PR and TR  . WRIST ARTHROPLASTY Left 10/17/2017   Procedure: CAPITATE RESURFACING ARTHROPLASTY;  Surgeon: Dairl Ponder, MD;  Location: [facility];  Service: Orthopedics;  Laterality: Left;    Allergies  Allergen Reactions  . Asa [Aspirin] Shortness Of Breath    "asthma symptoms"  . Penicillins Shortness Of Breath    Has patient had a PCN reaction causing immediate  rash, facial/tongue/throat swelling, SOB or lightheadedness with hypotension: Yes Has patient had a PCN reaction causing severe rash involving mucus membranes or skin necrosis: No Has patient had a PCN reaction that required hospitalization No Has patient had a PCN reaction occurring within the last 10 years: No If all of the above answers are "NO", then may proceed with Cephalosporin use. "asthma symptoms" Patient has tolerated cefazolin, ceftriaxo  . Vancomycin Anaphylaxis    Immediately following being turned into prone position and Vancomycin administration in the OR patient cardiac arrest w/  Vfib.    Current Outpatient Medications on File Prior to Visit  Medication Sig Dispense Refill  . pantoprazole (PROTONIX) 40 MG tablet Take 40 mg by mouth daily.    . metoprolol tartrate (LOPRESSOR) 25 MG tablet Take 0.5 tablets (12.5 mg total) by mouth 2 (two) times daily. (Patient not taking: Reported on 12/24/2018) 60 tablet 0   No current facility-administered medications on file prior to visit.         Objective:   Physical Exam Blood pressure (!) 149/77, pulse 88, temperature 98.6 F (37 C), height 6' (1.829 m), weight 196 lb 14.4 oz (89.3 kg). Alert and oriented. Skin warm and dry. Oral mucosa is moist.   . Sclera anicteric, conjunctivae is pink. Thyroid not enlarged. No cervical lymphadenopathy. Lungs clear. Heart regular rate and rhythm.  Abdomen is soft. Bowel sounds are positive. No hepatomegaly. Spleen is enlarged.  No abdominal masses felt. No tenderness.  No edema to lower extremities.        Assessment & Plan:  Splenomegaly. Slight increase in volume since January. Elevated liver enzymes: Am going to repeat. Leukopenia. Will get a CBC. He has consult with Hematology in July. Further recommendations to follow.

## 2018-12-24 NOTE — Patient Instructions (Signed)
Labs today

## 2018-12-24 NOTE — Telephone Encounter (Signed)
err

## 2018-12-24 NOTE — Telephone Encounter (Signed)
Patient is requesting a note to return to work since Dewar testing was negative. Please call when ready. CB# [phone]

## 2018-12-25 LAB — CBC WITH DIFFERENTIAL/PLATELET
Absolute Monocytes: 212 cells/uL (ref 200–950)
Basophils Absolute: 10 cells/uL (ref 0–200)
Basophils Relative: 0.5 %
Eosinophils Absolute: 132 cells/uL (ref 15–500)
Eosinophils Relative: 6.6 %
HCT: 35.8 % — ABNORMAL LOW (ref 38.5–50.0)
Hemoglobin: 12.4 g/dL — ABNORMAL LOW (ref 13.2–17.1)
Lymphs Abs: 546 cells/uL — ABNORMAL LOW (ref 850–3900)
MCH: 29.1 pg (ref 27.0–33.0)
MCHC: 34.6 g/dL (ref 32.0–36.0)
MCV: 84 fL (ref 80.0–100.0)
MPV: 13 fL — ABNORMAL HIGH (ref 7.5–12.5)
Monocytes Relative: 10.6 %
Neutro Abs: 1100 cells/uL — ABNORMAL LOW (ref 1500–7800)
Neutrophils Relative %: 55 %
Platelets: 62 10*3/uL — ABNORMAL LOW (ref 140–400)
RBC: 4.26 10*6/uL (ref 4.20–5.80)
RDW: 14 % (ref 11.0–15.0)
Total Lymphocyte: 27.3 %
WBC: 2 10*3/uL — ABNORMAL LOW (ref 3.8–10.8)

## 2018-12-25 LAB — CULTURE, BLOOD (ROUTINE X 2)
Culture: NO GROWTH
Culture: NO GROWTH
Special Requests: ADEQUATE
Special Requests: ADEQUATE

## 2018-12-25 LAB — HEPATIC FUNCTION PANEL
AG Ratio: 1.7 (calc) (ref 1.0–2.5)
ALT: 34 U/L (ref 9–46)
AST: 41 U/L — ABNORMAL HIGH (ref 10–35)
Albumin: 3.6 g/dL (ref 3.6–5.1)
Alkaline phosphatase (APISO): 206 U/L — ABNORMAL HIGH (ref 35–144)
Bilirubin, Direct: 0.4 mg/dL — ABNORMAL HIGH (ref 0.0–0.2)
Globulin: 2.1 g/dL (calc) (ref 1.9–3.7)
Indirect Bilirubin: 1.1 mg/dL (calc) (ref 0.2–1.2)
Total Bilirubin: 1.5 mg/dL — ABNORMAL HIGH (ref 0.2–1.2)
Total Protein: 5.7 g/dL — ABNORMAL LOW (ref 6.1–8.1)

## 2018-12-29 ENCOUNTER — Other Ambulatory Visit (INDEPENDENT_AMBULATORY_CARE_PROVIDER_SITE_OTHER): Payer: Self-pay | Admitting: *Deleted

## 2018-12-29 ENCOUNTER — Telehealth (INDEPENDENT_AMBULATORY_CARE_PROVIDER_SITE_OTHER): Payer: Self-pay | Admitting: Internal Medicine

## 2018-12-29 DIAGNOSIS — K760 Fatty (change of) liver, not elsewhere classified: Secondary | ICD-10-CM

## 2018-12-29 NOTE — Telephone Encounter (Signed)
Please look and [facility] chart. WBC ct dropped to 1. Bilirubin was up. Back to baseline now. Has appt with hematology.  There was a concern for his spleen. Volume had doubled since 2017.

## 2018-12-30 ENCOUNTER — Encounter (HOSPITAL_COMMUNITY): Payer: Self-pay | Admitting: Hematology

## 2018-12-31 ENCOUNTER — Inpatient Hospital Stay (HOSPITAL_COMMUNITY): Payer: PPO | Attending: Hematology | Admitting: Hematology

## 2018-12-31 ENCOUNTER — Inpatient Hospital Stay (HOSPITAL_COMMUNITY): Payer: PPO

## 2018-12-31 ENCOUNTER — Other Ambulatory Visit: Payer: Self-pay

## 2018-12-31 VITALS — BP 131/57 | HR 85 | Temp 97.3°F | Resp 18 | Wt 197.9 lb

## 2018-12-31 DIAGNOSIS — D731 Hypersplenism: Secondary | ICD-10-CM | POA: Insufficient documentation

## 2018-12-31 DIAGNOSIS — M129 Arthropathy, unspecified: Secondary | ICD-10-CM | POA: Diagnosis not present

## 2018-12-31 DIAGNOSIS — Z86010 Personal history of colonic polyps: Secondary | ICD-10-CM | POA: Insufficient documentation

## 2018-12-31 DIAGNOSIS — Z8674 Personal history of sudden cardiac arrest: Secondary | ICD-10-CM | POA: Insufficient documentation

## 2018-12-31 DIAGNOSIS — J45909 Unspecified asthma, uncomplicated: Secondary | ICD-10-CM | POA: Insufficient documentation

## 2018-12-31 DIAGNOSIS — K449 Diaphragmatic hernia without obstruction or gangrene: Secondary | ICD-10-CM | POA: Diagnosis not present

## 2018-12-31 DIAGNOSIS — Z87442 Personal history of urinary calculi: Secondary | ICD-10-CM | POA: Diagnosis not present

## 2018-12-31 DIAGNOSIS — I700 Atherosclerosis of aorta: Secondary | ICD-10-CM | POA: Diagnosis not present

## 2018-12-31 DIAGNOSIS — D61818 Other pancytopenia: Secondary | ICD-10-CM | POA: Insufficient documentation

## 2018-12-31 DIAGNOSIS — Z86718 Personal history of other venous thrombosis and embolism: Secondary | ICD-10-CM | POA: Insufficient documentation

## 2018-12-31 DIAGNOSIS — K7581 Nonalcoholic steatohepatitis (NASH): Secondary | ICD-10-CM | POA: Diagnosis not present

## 2018-12-31 DIAGNOSIS — K219 Gastro-esophageal reflux disease without esophagitis: Secondary | ICD-10-CM | POA: Insufficient documentation

## 2018-12-31 DIAGNOSIS — K7469 Other cirrhosis of liver: Secondary | ICD-10-CM | POA: Insufficient documentation

## 2018-12-31 DIAGNOSIS — Z87891 Personal history of nicotine dependence: Secondary | ICD-10-CM

## 2018-12-31 LAB — RETICULOCYTES
Immature Retic Fract: 4.6 % (ref 2.3–15.9)
RBC.: 4.8 MIL/uL (ref 4.22–5.81)
Retic Count, Absolute: 99.4 10*3/uL (ref 19.0–186.0)
Retic Ct Pct: 2.1 % (ref 0.4–3.1)

## 2018-12-31 LAB — CBC WITH DIFFERENTIAL/PLATELET
Abs Immature Granulocytes: 0 10*3/uL (ref 0.00–0.07)
Basophils Absolute: 0 10*3/uL (ref 0.0–0.1)
Basophils Relative: 1 %
Eosinophils Absolute: 0.1 10*3/uL (ref 0.0–0.5)
Eosinophils Relative: 6 %
HCT: 41.9 % (ref 39.0–52.0)
Hemoglobin: 13.7 g/dL (ref 13.0–17.0)
Immature Granulocytes: 0 %
Lymphocytes Relative: 24 %
Lymphs Abs: 0.6 10*3/uL — ABNORMAL LOW (ref 0.7–4.0)
MCH: 28.5 pg (ref 26.0–34.0)
MCHC: 32.7 g/dL (ref 30.0–36.0)
MCV: 87.3 fL (ref 80.0–100.0)
Monocytes Absolute: 0.2 10*3/uL (ref 0.1–1.0)
Monocytes Relative: 10 %
Neutro Abs: 1.4 10*3/uL — ABNORMAL LOW (ref 1.7–7.7)
Neutrophils Relative %: 59 %
Platelets: UNDETERMINED 10*3/uL (ref 150–400)
RBC: 4.8 MIL/uL (ref 4.22–5.81)
RDW: 13.9 % (ref 11.5–15.5)
WBC: 2.4 10*3/uL — ABNORMAL LOW (ref 4.0–10.5)
nRBC: 0 % (ref 0.0–0.2)

## 2018-12-31 LAB — VITAMIN B12: Vitamin B-12: 396 pg/mL (ref 180–914)

## 2018-12-31 LAB — LACTATE DEHYDROGENASE: LDH: 208 U/L — ABNORMAL HIGH (ref 98–192)

## 2018-12-31 LAB — FOLATE: Folate: 21.8 ng/mL (ref >5.9)

## 2018-12-31 NOTE — Assessment & Plan Note (Signed)
1.  Pancytopenia: - Patient with a history of decreased white count since August 2017.  Decreased platelet count since 2014. -Recent admission to [facility] on 12/20/2018 with a left upper quadrant pain.  He was found to be in A. fib which resolved.  He was discharged home on 12/21/2018. - His white count was as low as 1000 and platelet count in the 60,000 range. - Denies any fevers, night sweats or weight loss.  Had mild low-grade fever for a couple of days prior to recent admission. - He was diagnosed with the NASH cirrhosis and splenomegaly. - CT scan of the abdomen and pelvis on 12/19/2016 showed splenomegaly.  Ultrasound of the abdomen on 12/21/2018 showed splenomegaly measuring 21.9 x 10.8 x 21 cm with a volume of 265 cc.  No splenic vein thrombosis was seen. - EGD on 12/17/2016 showed normal proximal and mid esophagus.  Esophageal varices, grade 1 were noted.  Portal hypertensive gastropathy.  Normal duodenum. -Father also reportedly had NASH cirrhosis with cytopenias. - Most likely cause of his pancytopenia is hypersplenism.  However we will rule out nutritional deficiencies by checking, W46, folic acid, methylmalonic acid, copper levels.  We will also repeat his CBC with differential, check his LDH.  We will send his blood for flow cytometry to rule out LGL leukemia.  We will also check an SPEP. -He will come back in 2 weeks for follow-up.  2.  Provoked DVT: - He had a deep vein thrombosis of the lower extremity after knee surgery 12 years ago.  He was on limited duration anticoagulation.  No recurrence noted.

## 2018-12-31 NOTE — Telephone Encounter (Signed)
Labs reviewed with Ms.Setzer. Suspect viral infection given acute on chronic leukopenia.  He has chronic leukopenia secondary to cirrhosis and splenomegaly. WBC is coming up.  He is being evaluated by Dr. Delton Coombes to rule out other causes.

## 2018-12-31 NOTE — Patient Instructions (Signed)
[place] at [facility] Discharge Instructions  You were seen today by Dr. Delton Coombes. He went over your history, family history and how you've been feeling lately. He will get lab work done today. He will see you back in 2 weeks for labs and follow up.   Thank you for choosing [place] at [facility] to provide your oncology and hematology care.  To afford each patient quality time with our provider, please arrive at least 15 minutes before your scheduled appointment time.   If you have a lab appointment with the [facility] please come in thru the  Main Entrance and check in at the main information desk  You need to re-schedule your appointment should you arrive 10 or more minutes late.  We strive to give you quality time with our providers, and arriving late affects you and other patients whose appointments are after yours.  Also, if you no show three or more times for appointments you may be dismissed from the clinic at the providers discretion.     Again, thank you for choosing [facility].  Our hope is that these requests will decrease the amount of time that you wait before being seen by our physicians.       _____________________________________________________________  Should you have questions after your visit to [facility], please contact our office at [phone] between the hours of 8:00 a.m. and 4:30 p.m.  Voicemails left after 4:00 p.m. will not be returned until the following business day.  For prescription refill requests, have your pharmacy contact our office and allow 72 hours.    Cancer Center Support Programs:   > Cancer Support Group  2nd Tuesday of the month 1pm-2pm, Journey Room

## 2018-12-31 NOTE — Progress Notes (Signed)
CONSULT NOTE  Patient Care Team: Birdie Sons, MD as PCP - General (Family Medicine) Nahser, Wonda Cheng, MD as PCP - Cardiology (Cardiology) Rogene Houston, MD as Consulting Physician (Gastroenterology) Charlotte Crumb, MD as Consulting Physician (Orthopedic Surgery)  CHIEF COMPLAINTS/PURPOSE OF CONSULTATION:  Severe leukopenia and moderate thrombocytopenia.  HISTORY OF PRESENTING ILLNESS:  Stephen Burch 63 y.o. male is seen in consultation today at the request of hospitalist service from [facility].  He was recently hospitalized from 12/20/2018 through 12/21/2018 with left upper quadrant pain.  A CT of the chest PE protocol was negative for pulmonary embolism.  CT of the abdomen and pelvis showed massive splenomegaly.  He was also found to be in atrial fibrillation which later resolved.  He was not put on anticoagulation secondary to moderate thrombocytopenia.  An ultrasound of the abdomen on 12/21/2018 showed no evidence of splenic vein thrombosis.  Spleen volume was 2625 cc.  He has a history of [place] cirrhosis and splenomegaly.  EGD in 2018 showed grade 1 esophageal varices and portal hypertensive gastropathy.  Denied any rectal bleeding or hematemesis.  Denies any recurrent infections or hospitalizations.  Denies any fevers, night sweats or weight loss in the last 1 year.  He had bilateral knee replacements and multiple revision surgeries.  He works as a Software engineer for [employer].  He was never smoker and nonalcoholic.  No exposure to chemicals.  Family history significant for father with Karlene Lineman cirrhosis and low blood counts.  No family history of malignancies.  MEDICAL HISTORY:  Past Medical History:  Diagnosis Date  . Arthritis    knees, wrists  . Barrett's esophagus   . Complication of anesthesia    02-22-2016 intraop cardiac arrest immediately following moving pt into prone position and vancomyocin administration , pt cardiac arrest w/ Vfib,  please refer to anesthesia  record in epic  . Esophageal varices determined by endoscopy ([place]) 11/2016   grade 1  . GERD (gastroesophageal reflux disease)   . Hiatal hernia   . History of adenomatous polyp of colon   . History of asthma    as child  . History of DVT (deep vein thrombosis)    right lower extremitty post-op right total knee surgery 09/ 2010,  treated w/ coumadin  . History of esophageal dilatation 07/1998   for schatzski ring  . History of kidney stones   . History of staph infection 04/2016   MSSA infection of right total knee w/ sepsis  . Hx of cardiac arrest    02-22-2016  intraoperative, immediately following moving pt into prone position and vancomyocin administration, cardiac arrest w/ Vfib (referred to anesthesia record in epic) pt extubated himself next day  . Liver cirrhosis secondary to NASH (nonalcoholic steatohepatitis) ([place])    NAFLD-- followed by dr Laural Golden---  liver bx 09-30-2012  mild portal and focal sinusoidal fibrosis  . Scapholunate advanced collapse of left wrist    deformity  . Thrombocytopenia ([place])    10-09-2017 per pt his platelets have always been low , followed by pcp, never been referred to hematologist    SURGICAL HISTORY: Past Surgical History:  Procedure Laterality Date  . APPLICATION OF WOUND VAC Right 02/22/2016   Procedure: APPLICATION OF WOUND VAC;  Surgeon: Vickey Huger, MD;  Location: [place];  Service: Orthopedics;  Laterality: Right;  . CARDIAC CATHETERIZATION N/A 02/24/2016   Procedure: Left Heart Cath and Coronary Angiography;  Surgeon: Burnell Blanks, MD;  Location: [facility];  Service: Cardiovascular;  Laterality: N/A;  No angiographic evidence of CAD,  normal LVSF, ef 50-55%  . CARPECTOMY Left 10/17/2017   Procedure: LEFT WRIST PROXIMAL ROW CARPECTOMY WITH POSTEROR IMBROSSEOUS NERVE EXCISION;  Surgeon: Charlotte Crumb, MD;  Location: [place];  Service: Orthopedics;  Laterality: Left;  AXILLARY BLOCK  . EAR CYST EXCISION  Right 09/06/2014   Procedure: OPEN EXCISION BAKER'S CYST RIGHT KNEE;  Surgeon: Vickey Huger, MD;  Location: [place];  Service: Orthopedics;  Laterality: Right;  . ESOPHAGEAL DILATION  1996/ 2000  . ESOPHAGOGASTRODUODENOSCOPY N/A 09/17/2012   Procedure: ESOPHAGOGASTRODUODENOSCOPY (EGD);  Surgeon: Rogene Houston, MD;  [facility];  Service: Endoscopy;  Laterality: N/A;  200  . ESOPHAGOGASTRODUODENOSCOPY N/A 12/15/2015   Procedure: ESOPHAGOGASTRODUODENOSCOPY (EGD);  Surgeon: Rogene Houston, MD;  [facility];  Service: Endoscopy;  Laterality: N/A;  1245  . ESOPHAGOGASTRODUODENOSCOPY N/A 12/17/2016   Procedure: ESOPHAGOGASTRODUODENOSCOPY (EGD);  Surgeon: Rogene Houston, MD;  [facility];  Service: Endoscopy;  Laterality: N/A;  210  . I&D KNEE WITH POLY EXCHANGE Right 04/02/2016   Procedure: IRRIGATION AND DEBRIDEMENT RIGHT KNEE WITH POLY EXCHANGE;  Surgeon: Vickey Huger, MD;  Location: [place];  Service: Orthopedics;  Laterality: Right;  . INCISION AND DRAINAGE HEMATOMA POST LEFT  TOTAL KNEE  2012  . INGUINAL HERNIA REPAIR Bilateral 05/22/2013   Procedure: REPAIR OF RECURRENT INCARCERATED INGUINAL HERNIA WITH MESH RIGHT SIDE,  REPAIR OF RECURRENT INGUINAL HERNIA WITH MESH LEFT SIDE;  Surgeon: [name], MD;  Location: [place];  Service: General;  Laterality: Bilateral;  . INGUINAL HERNIA REPAIR Bilateral 11/1997  . INSERTION OF MESH Bilateral 05/22/2013   Procedure: INSERTION OF MESH;  Surgeon: [name], MD;  Location: [place];  Service: General;  Laterality: Bilateral;  . IRRIGATION AND DEBRIDEMENT KNEE Right 02/22/2016   Procedure: IRRIGATION AND DEBRIDEMENT KNEE;  Surgeon: Vickey Huger, MD;  Location: [place];  Service: Orthopedics;  Laterality: Right;  . Irrigation and Debridement right knee  03/23/2009   Dr. Sabra Heck, [facility]  . KNEE ARTHROSCOPY W/ SYNOVECTOMY Right 01/30/2010   AND DEBRIDEMENT OF HETEROTOPIC  . LIVER BIOPSY  09/30/2012  . REVISION TOTAL KNEE  ARTHROPLASTY Left fall 2012  . TONSILLECTOMY  child  . TOTAL KNEE ARTHROPLASTY Bilateral right 09/ 2010/  left 09-18-2010  . TRANSTHORACIC ECHOCARDIOGRAM  02/22/2016   ef 30-35% (per cardiac cath 02-24-2016 normal), severe hypokinesis of the mid-apicalanteroseptal and apical myocardium,  grade 1 diastolic dysfunction/ trivial PR and TR  . WRIST ARTHROPLASTY Left 10/17/2017   Procedure: CAPITATE RESURFACING ARTHROPLASTY;  Surgeon: Charlotte Crumb, MD;  Location: [facility];  Service: Orthopedics;  Laterality: Left;    SOCIAL HISTORY: Social History   Socioeconomic History  . Marital status: Legally Separated    Spouse name: Not on file  . Number of children: 2  . Years of education: Not on file  . Highest education level: 12th grade  Occupational History  . 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 Financial resource strain: Not hard at all  . Food insecurity    Worry: Never true    Inability: Never true  . Transportation needs    Medical: No    Non-medical: No  Tobacco Use  . Smoking status: Former Smoker    Years: 3.00    Types: Cigarettes    Quit date: 10/10/1974    Years since  quitting: 44.2  . Smokeless tobacco: Never Used  Substance and Sexual Activity  . Alcohol use: Not Currently  . Drug use: No  . Sexual activity: Not on file  Lifestyle  . Physical activity    Days per week: Not on file    Minutes per session: Not on file  . Stress: Not at all  Relationships  . Social Herbalist on phone: Not on file    Gets together: Not on file    Attends religious service: Not on file    Active member of club or organization: Not on file    Attends meetings of clubs or organizations: Not on file    Relationship status: Not on file  . Intimate partner violence    Fear of current or ex partner: Not on file    Emotionally abused: Not on file    Physically abused: Not on file    Forced sexual  activity: Not on file  Other Topics Concern  . Not on file  Social History Narrative   ** Merged History Encounter **       Pt lives alone.    FAMILY HISTORY: Family History  Problem Relation Age of Onset  . Arthritis Father        died age 75  . GI Bleed Father        upper GI Bleed, non-ETOH cirrhosis  . Arthritis Brother   . Diabetes Brother        type 2  . Diabetes Paternal Uncle        type 2  . Heart attack Maternal Grandmother   . Heart attack Maternal Grandfather   . Colon cancer Neg Hx   . Colon polyps Neg Hx     ALLERGIES:  is allergic to asa [aspirin]; penicillins; and vancomycin.  MEDICATIONS:  Current Outpatient Medications  Medication Sig Dispense Refill  . metoprolol tartrate (LOPRESSOR) 25 MG tablet Take 0.5 tablets (12.5 mg total) by mouth 2 (two) times daily. 60 tablet 0   No current facility-administered medications for this visit.     REVIEW OF SYSTEMS:   Constitutional: Denies fevers, chills or abnormal night sweats Eyes: Denies blurriness of vision, double vision or watery eyes Ears, nose, mouth, throat, and face: Denies mucositis or sore throat Respiratory: Denies cough, dyspnea or wheezes Cardiovascular: Denies palpitation, chest discomfort or lower extremity swelling Gastrointestinal:  Denies nausea, heartburn or change in bowel habits Skin: Denies abnormal skin rashes Lymphatics: Denies new lymphadenopathy or easy bruising Neurological:Denies numbness, tingling or new weaknesses Behavioral/Psych: Mood is stable, no new changes  All other systems were reviewed with the patient and are negative.  PHYSICAL EXAMINATION: ECOG PERFORMANCE STATUS: 0 - Asymptomatic  Vitals:   12/31/18 1250  BP: (!) 131/57  Pulse: 85  Resp: 18  Temp: (!) 97.3 F (36.3 C)  SpO2: 95%   Filed Weights   12/31/18 1250  Weight: 197 lb 14.4 oz (89.8 kg)    GENERAL:alert, no distress and comfortable SKIN: skin color, texture, turgor are normal, no rashes  or significant lesions.  Vitiligo present. EYES: normal, conjunctiva are pink and non-injected, sclera clear OROPHARYNX:no exudate, no erythema and lips, buccal mucosa, and tongue normal  NECK: supple, thyroid normal size, non-tender, without nodularity LYMPH:  no palpable lymphadenopathy in the cervical, axillary or inguinal LUNGS: clear to auscultation and percussion with normal breathing effort HEART: regular rate & rhythm and no murmurs and no lower extremity edema ABDOMEN: Splenomegaly palpable 4 to  5 fingerbreadths below the left costal margin. Musculoskeletal:no cyanosis of digits and no clubbing  PSYCH: alert & oriented x 3 with fluent speech NEURO: no focal motor/sensory deficits  LABORATORY DATA:  I have reviewed the data as listed Recent Results (from the past 2160 hour(s))  POCT glycosylated hemoglobin (Hb A1C)     Status: Abnormal   Collection Time: 11/14/18  2:58 PM  Result Value Ref Range   Hemoglobin A1C 6.1 (A) 4.0 - 5.6 %   HbA1c POC (<> result, manual entry)     HbA1c, POC (prediabetic range)     HbA1c, POC (controlled diabetic range)     Est. average glucose Bld gHb Est-mCnc 128   Novel Coronavirus, NAA ([facility])     Status: None   Collection Time: 12/19/18  2:10 PM  Result Value Ref Range   SARS-CoV-2, NAA Not Detected Not Detected    Comment: Testing was performed using the cobas(R) SARS-CoV-2 test. This test was developed and its performance characteristics determined by [employer]. This test has not been FDA cleared or approved. This test has been authorized by FDA under an Emergency Use Authorization (EUA). This test is only authorized for the duration of time the declaration that circumstances exist justifying the authorization of the emergency use of in vitro diagnostic tests for detection of SARS-CoV-2 virus and/or diagnosis of COVID-19 infection under section 564(b)(1) of the Act, 21 U.S.C. 856DJS-9(F)(0), unless the authorization is  terminated or revoked sooner. When diagnostic testing is negative, the possibility of a false negative result should be considered in the context of a patient's recent exposures and the presence of clinical signs and symptoms consistent with COVID-19. An individual without symptoms of COVID-19 and who is not shedding SARS-CoV-2 virus would expect to have a negati ve (not detected) result in this assay.   SARS Coronavirus 2 (CEPHEID- Performed in [place] hospital lab), Hosp Order     Status: None   Collection Time: 12/20/18  3:46 PM   Specimen: Nasopharyngeal Swab  Result Value Ref Range   SARS Coronavirus 2 NEGATIVE NEGATIVE    Comment: (NOTE) If result is NEGATIVE SARS-CoV-2 target nucleic acids are NOT DETECTED. The SARS-CoV-2 RNA is generally detectable in upper and lower  respiratory specimens during the acute phase of infection. The lowest  concentration of SARS-CoV-2 viral copies this assay can detect is 250  copies / mL. A negative result does not preclude SARS-CoV-2 infection  and should not be used as the sole basis for treatment or other  patient management decisions.  A negative result may occur with  improper specimen collection / handling, submission of specimen other  than nasopharyngeal swab, presence of viral mutation(s) within the  areas targeted by this assay, and inadequate number of viral copies  (<250 copies / mL). A negative result must be combined with clinical  observations, patient history, and epidemiological information. If result is POSITIVE SARS-CoV-2 target nucleic acids are DETECTED. The SARS-CoV-2 RNA is generally detectable in upper and lower  respiratory specimens dur ing the acute phase of infection.  Positive  results are indicative of active infection with SARS-CoV-2.  Clinical  correlation with patient history and other diagnostic information is  necessary to determine patient infection status.  Positive results do  not rule out bacterial  infection or co-infection with other viruses. If result is PRESUMPTIVE POSTIVE SARS-CoV-2 nucleic acids MAY BE PRESENT.   A presumptive positive result was obtained on the submitted specimen  and confirmed on repeat testing.  While 2019 novel coronavirus  (SARS-CoV-2) nucleic acids may be present in the submitted sample  additional confirmatory testing may be necessary for epidemiological  and / or clinical management purposes  to differentiate between  SARS-CoV-2 and other Sarbecovirus currently known to infect humans.  If clinically indicated additional testing with an alternate test  methodology [phone]) is advised. The SARS-CoV-2 RNA is generally  detectable in upper and lower respiratory sp ecimens during the acute  phase of infection. The expected result is Negative. Fact Sheet for Patients:  StrictlyIdeas.no Fact Sheet for Healthcare Providers: BankingDealers.co.za This test is not yet approved or cleared by the [place] FDA and has been authorized for detection and/or diagnosis of SARS-CoV-2 by FDA under an Emergency Use Authorization (EUA).  This EUA will remain in effect (meaning this test can be used) for the duration of the COVID-19 declaration under Section 564(b)(1) of the Act, 21 U.S.C. section 360bbb-3(b)(1), unless the authorization is terminated or revoked sooner. Performed at [facility], [address] [place], [place] [address]   Comprehensive metabolic panel     Status: Abnormal   Collection Time: 12/20/18  3:49 PM  Result Value Ref Range   Sodium 135 135 - 145 mmol/L   Potassium 3.8 3.5 - 5.1 mmol/L   Chloride 104 98 - 111 mmol/L   CO2 22 22 - 32 mmol/L   Glucose, Bld 219 (H) 70 - 99 mg/dL   BUN 17 8 - 23 mg/dL   Creatinine, Ser 0.82 0.61 - 1.24 mg/dL   Calcium 8.3 (L) 8.9 - 10.3 mg/dL   Total Protein 6.5 6.5 - 8.1 g/dL   Albumin 3.7 3.5 - 5.0 g/dL   AST 52 (H) 15 - 41 U/L   ALT 43 0 - 44 U/L    Alkaline Phosphatase 197 (H) 38 - 126 U/L   Total Bilirubin 3.6 (H) 0.3 - 1.2 mg/dL   GFR calc non Af Amer >60 >60 mL/min   GFR calc Af Amer >60 >60 mL/min   Anion gap 9 5 - 15    Comment: Performed at [facility], [address] [place], [place] [address]  CBC     Status: Abnormal   Collection Time: 12/20/18  3:49 PM  Result Value Ref Range   WBC 1.0 (LL) 4.0 - 10.5 K/uL    Comment: REPEATED TO VERIFY WHITE COUNT CONFIRMED ON SMEAR THIS CRITICAL RESULT HAS VERIFIED AND BEEN CALLED TO [facility] BY LATISHA HENDERSON ON 06 20 2020 AT 1620, AND HAS BEEN READ BACK. CRITICAL RESULT VERIFIED    RBC 4.91 4.22 - 5.81 MIL/uL   Hemoglobin 14.0 13.0 - 17.0 g/dL   HCT 40.6 39.0 - 52.0 %   MCV 82.7 80.0 - 100.0 fL   MCH 28.5 26.0 - 34.0 pg   MCHC 34.5 30.0 - 36.0 g/dL   RDW 13.9 11.5 - 15.5 %   Platelets 69 (L) 150 - 400 K/uL    Comment: REPEATED TO VERIFY PLATELET COUNT CONFIRMED BY SMEAR SPECIMEN CHECKED FOR CLOTS    nRBC 0.0 0.0 - 0.2 %    Comment: Performed at [facility], [address] [place], [place] [address]  Troponin I - Once     Status: None   Collection Time: 12/20/18  3:49 PM  Result Value Ref Range   Troponin I <0.03 <0.03 ng/mL    Comment: Performed at [facility], [address], [place],  [address]  D-dimer, quantitative (not at [facility])     Status: Abnormal  Collection Time: 12/20/18  3:49 PM  Result Value Ref Range   D-Dimer, Quant 1.21 (H) 0.00 - 0.50 ug/mL-FEU    Comment: (NOTE) At the manufacturer cut-off of 0.50 ug/mL FEU, this assay has been documented to exclude PE with a sensitivity and negative predictive value of 97 to 99%.  At this time, this assay has not been approved by the FDA to exclude DVT/VTE. Results should be correlated with clinical presentation. Performed at [facility], [address], [place], [place] [address]   Differential     Status: Abnormal   Collection Time: 12/20/18  3:49 PM  Result Value Ref Range   Neutrophils Relative %  49 %   Neutro Abs 0.5 (L) 1.7 - 7.7 K/uL   Lymphocytes Relative 26 %   Lymphs Abs 0.2 (L) 0.7 - 4.0 K/uL   Monocytes Relative 24 %   Monocytes Absolute 0.2 0.1 - 1.0 K/uL   Eosinophils Relative 1 %   Eosinophils Absolute 0.0 0.0 - 0.5 K/uL   Basophils Relative 0 %   Basophils Absolute 0.0 0.0 - 0.1 K/uL   Immature Granulocytes 0 %   Abs Immature Granulocytes 0.00 0.00 - 0.07 K/uL    Comment: Performed at [facility], [address] [place], [place] [address]  Lactic acid, plasma     Status: None   Collection Time: 12/20/18  5:23 PM  Result Value Ref Range   Lactic Acid, Venous 1.0 0.5 - 1.9 mmol/L    Comment: Performed at [facility], [address], [place], [place] [address]  Blood Culture (routine x 2)     Status: None   Collection Time: 12/20/18  5:23 PM   Specimen: Right Antecubital; Blood  Result Value Ref Range   Specimen Description RIGHT ANTECUBITAL    Special Requests      BOTTLES DRAWN AEROBIC AND ANAEROBIC Blood Culture adequate volume   Culture      NO GROWTH 5 DAYS Performed at [facility], [address], [place], [place] [address]    Report Status 12/25/2018 FINAL   Blood Culture (routine x 2)     Status: None   Collection Time: 12/20/18  5:23 PM   Specimen: BLOOD RIGHT ARM  Result Value Ref Range   Specimen Description BLOOD RIGHT ARM    Special Requests      BOTTLES DRAWN AEROBIC AND ANAEROBIC Blood Culture adequate volume   Culture      NO GROWTH 5 DAYS Performed at [facility], [address] [place], [place] [address]    Report Status 12/25/2018 FINAL   Urinalysis, Routine w reflex microscopic     Status: Abnormal   Collection Time: 12/20/18  8:25 PM  Result Value Ref Range   Color, Urine AMBER (A) YELLOW    Comment: BIOCHEMICALS MAY BE AFFECTED BY COLOR   APPearance CLEAR CLEAR   Specific Gravity, Urine 1.041 (H) 1.005 - 1.030   pH 5.0 5.0 - 8.0   Glucose, UA NEGATIVE NEGATIVE mg/dL   Hgb urine dipstick NEGATIVE NEGATIVE   Bilirubin Urine  NEGATIVE NEGATIVE   Ketones, ur NEGATIVE NEGATIVE mg/dL   Protein, ur NEGATIVE NEGATIVE mg/dL   Nitrite NEGATIVE NEGATIVE   Leukocytes,Ua NEGATIVE NEGATIVE    Comment: Performed at [facility], [address] [place], [place] [address]  Lactic acid, plasma     Status: None   Collection Time: 12/20/18  9:12 PM  Result Value Ref Range   Lactic Acid, Venous 0.9 0.5 - 1.9 mmol/L  Comment: Performed at [facility], [address], [place], [place] [address]  HIV antibody (Routine Testing)     Status: None   Collection Time: 12/20/18  9:12 PM  Result Value Ref Range   HIV Screen 4th Generation wRfx Non Reactive Non Reactive    Comment: (NOTE) Performed At: [facility] [place], [place] [id number] Rush Farmer MD [id number]   TSH     Status: Abnormal   Collection Time: 12/20/18  9:12 PM  Result Value Ref Range   TSH <0.010 (L) 0.350 - 4.500 uIU/mL    Comment: Performed by a 3rd Generation assay with a functional sensitivity of <=0.01 uIU/mL. Performed at [facility], [address], [place], [place] [address]   Troponin I - Now Then Q6H     Status: None   Collection Time: 12/20/18  9:12 PM  Result Value Ref Range   Troponin I <0.03 <0.03 ng/mL    Comment: Performed at [facility], [address] [place], [place] [address]  Gamma GT     Status: Abnormal   Collection Time: 12/20/18  9:12 PM  Result Value Ref Range   GGT 107 (H) 7 - 50 U/L    Comment: Performed at [place] [address], [place], [place] [address]  Lactate dehydrogenase     Status: Abnormal   Collection Time: 12/20/18  9:12 PM  Result Value Ref Range   LDH 278 (H) 98 - 192 U/L    Comment: Performed at [facility], [address], [place], [place] [address]  APTT     Status: Abnormal   Collection Time: 12/20/18  9:12 PM  Result Value Ref Range   aPTT 42 (H) 24 - 36 seconds    Comment:        IF BASELINE aPTT IS ELEVATED, SUGGEST PATIENT RISK ASSESSMENT BE USED TO  DETERMINE APPROPRIATE ANTICOAGULANT THERAPY. Performed at [facility], [address] [place], [place] [address]   Protime-INR     Status: Abnormal   Collection Time: 12/20/18  9:12 PM  Result Value Ref Range   Prothrombin Time 15.9 (H) 11.4 - 15.2 seconds   INR 1.3 (H) 0.8 - 1.2    Comment: (NOTE) INR goal varies based on device and disease states. Performed at [facility], [address], [place], [place] [address]   Glucose, capillary     Status: None   Collection Time: 12/21/18 12:03 AM  Result Value Ref Range   Glucose-Capillary 97 70 - 99 mg/dL  Glucose, capillary     Status: Abnormal   Collection Time: 12/21/18  4:40 AM  Result Value Ref Range   Glucose-Capillary 102 (H) 70 - 99 mg/dL  Urine rapid drug screen (hosp performed)     Status: None   Collection Time: 12/21/18  7:48 AM  Result Value Ref Range   Opiates NONE DETECTED NONE DETECTED   Cocaine NONE DETECTED NONE DETECTED   Benzodiazepines NONE DETECTED NONE DETECTED   Amphetamines NONE DETECTED NONE DETECTED   Tetrahydrocannabinol NONE DETECTED NONE DETECTED   Barbiturates NONE DETECTED NONE DETECTED    Comment: (NOTE) DRUG SCREEN FOR MEDICAL PURPOSES ONLY.  IF CONFIRMATION IS NEEDED FOR ANY PURPOSE, NOTIFY LAB WITHIN 5 DAYS. LOWEST DETECTABLE LIMITS FOR URINE DRUG SCREEN Drug Class                     Cutoff (ng/mL) Amphetamine and metabolites    1000 Barbiturate and metabolites  200 Benzodiazepine                 268 Tricyclics and metabolites     300 Opiates and metabolites        300 Cocaine and metabolites        300 THC                            50 Performed at [facility], [place] [address] [place], [place] [address]   Glucose, capillary     Status: Abnormal   Collection Time: 12/21/18  8:06 AM  Result Value Ref Range   Glucose-Capillary 103 (H) 70 - 99 mg/dL  ECHOCARDIOGRAM COMPLETE     Status: None   Collection Time: 12/21/18  9:34 AM  Result Value Ref Range   Weight 3,099.2 oz    Height 72 in   BP 99/60 mmHg  T4, free     Status: Abnormal   Collection Time: 12/21/18 10:48 AM  Result Value Ref Range   Free T4 1.56 (H) 0.61 - 1.12 ng/dL    Comment: (NOTE) Biotin ingestion may interfere with free T4 tests. If the results are inconsistent with the TSH level, previous test results, or the clinical presentation, then consider biotin interference. If needed, order repeat testing after stopping biotin. Performed at [facility], [place] [address] [place], [place] [address]   T3     Status: None   Collection Time: 12/21/18 10:48 AM  Result Value Ref Range   T3, Total 161 71 - 180 ng/dL    Comment: (NOTE) Performed At: [facility] [place], [place] [id number] Rush Farmer MD [id number]   Glucose, capillary     Status: None   Collection Time: 12/21/18 12:14 PM  Result Value Ref Range   Glucose-Capillary 84 70 - 99 mg/dL  CBC with Differential/Platelet     Status: Abnormal   Collection Time: 12/24/18 12:28 PM  Result Value Ref Range   WBC 2.0 (L) 3.8 - 10.8 Thousand/uL   RBC 4.26 4.20 - 5.80 Million/uL   Hemoglobin 12.4 (L) 13.2 - 17.1 g/dL   HCT 35.8 (L) 38.5 - 50.0 %   MCV 84.0 80.0 - 100.0 fL   MCH 29.1 27.0 - 33.0 pg   MCHC 34.6 32.0 - 36.0 g/dL   RDW 14.0 11.0 - 15.0 %   Platelets 62 (L) 140 - 400 Thousand/uL    Comment: Platelet clumps noted on smear-count appears adequate.   MPV 13.0 (H) 7.5 - 12.5 fL   Neutro Abs 1,100 (L) 1,500 - 7,800 cells/uL   Lymphs Abs 546 (L) 850 - 3,900 cells/uL   Absolute Monocytes 212 200 - 950 cells/uL   Eosinophils Absolute 132 15 - 500 cells/uL   Basophils Absolute 10 0 - 200 cells/uL   Neutrophils Relative % 55 %   Total Lymphocyte 27.3 %   Monocytes Relative 10.6 %   Eosinophils Relative 6.6 %   Basophils Relative 0.5 %  Hepatic function panel     Status: Abnormal   Collection Time: 12/24/18 12:28 PM  Result Value Ref Range   Total Protein 5.7 (L) 6.1 - 8.1 g/dL   Albumin 3.6  3.6 - 5.1 g/dL   Globulin 2.1 1.9 - 3.7 g/dL (calc)   AG Ratio 1.7 1.0 - 2.5 (calc)   Total Bilirubin 1.5 (H) 0.2 - 1.2 mg/dL   Bilirubin, Direct 0.4 (H) 0.0 - 0.2  mg/dL   Indirect Bilirubin 1.1 0.2 - 1.2 mg/dL (calc)   Alkaline phosphatase (APISO) 206 (H) 35 - 144 U/L   AST 41 (H) 10 - 35 U/L   ALT 34 9 - 46 U/L    RADIOGRAPHIC STUDIES: I have personally reviewed the radiological images as listed and agreed with the findings in the report. Ct Abdomen Pelvis Wo Contrast  Result Date: 12/20/2018 CLINICAL DATA:  63 year old male with acute abdominal pain and fever. EXAM: CT ABDOMEN AND PELVIS WITHOUT CONTRAST TECHNIQUE: Multidetector CT imaging of the abdomen and pelvis was performed following the standard protocol without IV contrast. COMPARISON:  07/09/2018 ultrasound, 06/26/2016 MR and 06/27/2012 CT FINDINGS: Please note that parenchymal abnormalities may be missed without intravenous contrast. Lower chest: No acute abnormality Hepatobiliary: Hepatic configuration may suggest cirrhosis. No definite focal hepatic lesions are present. The gallbladder is unremarkable. No biliary dilatation. Pancreas: Unremarkable Spleen: Moderate to marked splenomegaly noted with splenic volume of 2000 cc, doubled in volume from 2017. no definite focal splenic abnormalities noted. Adrenals/Urinary Tract: Contrast within the renal collecting system and bladder from earlier chest CT today noted. The kidneys, adrenal glands and bladder are otherwise unremarkable except for a small RIGHT renal cyst. Stomach/Bowel: Stomach is within normal limits. Appendix appears normal. No evidence of bowel wall thickening, distention, or inflammatory changes. Vascular/Lymphatic: Aortic atherosclerosis. No enlarged abdominal or pelvic lymph nodes. Reproductive: Prostate is unremarkable. Other: No ascites, pneumoperitoneum or focal collection. Musculoskeletal: No acute or suspicious bony abnormalities. IMPRESSION: 1. No evidence of acute  abnormality 2. Moderate to marked splenomegaly with splenic volume of 2000 cc, doubled since 2017. 3. Question cirrhosis. 4.  Aortic Atherosclerosis (ICD10-I70.0). Electronically Signed   By: Margarette Canada M.D.   On: 12/20/2018 20:58   Ct Angio Chest Pe W/cm &/or Wo Cm  Result Date: 12/20/2018 CLINICAL DATA:  Onset left chest pain today after cutting grass. Pain is worse with inspiration. EXAM: CT ANGIOGRAPHY CHEST WITH CONTRAST TECHNIQUE: Multidetector CT imaging of the chest was performed using the standard protocol during bolus administration of intravenous contrast. Multiplanar CT image reconstructions and MIPs were obtained to evaluate the vascular anatomy. CONTRAST:  75 mL OMNIPAQUE IOHEXOL 350 MG/ML SOLN COMPARISON:  Single view of the chest earlier today. CT chest 03/04/2016. FINDINGS: Cardiovascular: Satisfactory opacification of the pulmonary arteries to the segmental level. No evidence of pulmonary embolism. Normal heart size. No pericardial effusion. Mediastinum/Nodes: No enlarged mediastinal, hilar, or axillary lymph nodes. Thyroid gland, trachea, and esophagus demonstrate no significant findings. Lungs/Pleura: No pleural effusion. Mild dependent atelectasis. No consolidative process or nodule. Upper Abdomen: They are is partial visualization of marked splenomegaly. The spleen measures approximately 18 cm AP x 8.7 cm transverse compared 18 cm x 7.8 cm transverse at approximately the same level on the prior CT. Imaged upper abdomen is otherwise negative. Musculoskeletal: No acute or focal bony abnormality. Review of the MIP images confirms the above findings. IMPRESSION: Negative for pulmonary embolus or acute cardiopulmonary disease. Marked splenomegaly appears slightly worse than on the prior CT. Electronically Signed   By: Inge Rise M.D.   On: 12/20/2018 19:21   Dg Chest Port 1 View  Result Date: 12/20/2018 CLINICAL DATA:  Chest pain and shortness of breath. No known injury. Worse with  movement and deep inspiration. EXAM: PORTABLE CHEST 1 VIEW COMPARISON:  04/01/2016; chest CT-03/04/2016 FINDINGS: Grossly unchanged borderline enlarged cardiac silhouette and mediastinal contours given persistently reduced lung volumes. Mild diffuse nodular thickening of the pulmonary interstitium. No discrete focal  airspace opacities. No pleural effusion or pneumothorax. No evidence of edema. No acute osseus abnormalities. IMPRESSION: Suspected mild airways disease / bronchitis on this AP portable examination. No focal airspace opacities to suggest pneumonia. Further evaluation with a PA and lateral chest radiograph may be obtained as clinically indicated. Electronically Signed   By: Sandi Mariscal M.D.   On: 12/20/2018 16:23   US Abdominal Pelvic Art/vent Flow Doppler Limited  Result Date: 12/21/2018 CLINICAL DATA:  Left upper quadrant pain. Rule out splenic vein thrombosis. EXAM: LIMITED ABDOMINAL ULTRASOUND DOPPLER ULTRASOUND OF SPLEEN TECHNIQUE: Transabdominal ultrasound examination of the spleen was performed. Color and duplex Doppler ultrasound was utilized to evaluate blood flow to the spleen. COMPARISON:  Noncontrast CT yesterday. FINDINGS: Spleen: Prominent splenomegaly seen on CT. Spleen measures 21.9 x 10.8 x 21.1 cm, volume 2625 cc. No focal abnormality. Doppler: Pulsed Doppler evaluation of the spleen demonstrates normal arterial and venous waveforms. Normal directional flow in the splenic vein. No evidence of splenic vein thrombosis. IMPRESSION: 1. No evidence of splenic vein thrombosis. Splenic arterial and venous blood flow is seen. 2. Prominent splenomegaly as seen on CT. Electronically Signed   By: Keith Rake M.D.   On: 12/21/2018 01:43   US Spleen (abdomen Limited)  Result Date: 12/21/2018 CLINICAL DATA:  Left upper quadrant pain. Rule out splenic vein thrombosis. EXAM: LIMITED ABDOMINAL ULTRASOUND DOPPLER ULTRASOUND OF SPLEEN TECHNIQUE: Transabdominal ultrasound examination of the  spleen was performed. Color and duplex Doppler ultrasound was utilized to evaluate blood flow to the spleen. COMPARISON:  Noncontrast CT yesterday. FINDINGS: Spleen: Prominent splenomegaly seen on CT. Spleen measures 21.9 x 10.8 x 21.1 cm, volume 2625 cc. No focal abnormality. Doppler: Pulsed Doppler evaluation of the spleen demonstrates normal arterial and venous waveforms. Normal directional flow in the splenic vein. No evidence of splenic vein thrombosis. IMPRESSION: 1. No evidence of splenic vein thrombosis. Splenic arterial and venous blood flow is seen. 2. Prominent splenomegaly as seen on CT. Electronically Signed   By: Keith Rake M.D.   On: 12/21/2018 01:43    ASSESSMENT & PLAN:  Other pancytopenia ([place]) 1.  Pancytopenia: - Patient with a history of decreased white count since August 2017.  Decreased platelet count since 2014. -Recent admission to [facility] on 12/20/2018 with a left upper quadrant pain.  He was found to be in A. fib which resolved.  He was discharged home on 12/21/2018. - His white count was as low as 1000 and platelet count in the 60,000 range. - Denies any fevers, night sweats or weight loss.  Had mild low-grade fever for a couple of days prior to recent admission. - He was diagnosed with the NASH cirrhosis and splenomegaly. - CT scan of the abdomen and pelvis on 12/19/2016 showed splenomegaly.  Ultrasound of the abdomen on 12/21/2018 showed splenomegaly measuring 21.9 x 10.8 x 21 cm with a volume of 265 cc.  No splenic vein thrombosis was seen. - EGD on 12/17/2016 showed normal proximal and mid esophagus.  Esophageal varices, grade 1 were noted.  Portal hypertensive gastropathy.  Normal duodenum. -Father also reportedly had NASH cirrhosis with cytopenias. - Most likely cause of his pancytopenia is hypersplenism.  However we will rule out nutritional deficiencies by checking, X73, folic acid, methylmalonic acid, copper levels.  We will also repeat his CBC with  differential, check his LDH.  We will send his blood for flow cytometry to rule out LGL leukemia.  We will also check an SPEP. -He will come back in  2 weeks for follow-up.  2.  Provoked DVT: - He had a deep vein thrombosis of the lower extremity after knee surgery 12 years ago.  He was on limited duration anticoagulation.  No recurrence noted.  Total time spent is 45 minutes with more than 50% of the time spent face-to-face discussing differential diagnosis for his pancytopenia, further work-up and coordination of care.   All questions were answered. The patient knows to call the clinic with any problems, questions or concerns.      Derek Jack, MD 12/31/18 1:32 PM

## 2019-01-01 DIAGNOSIS — D61818 Other pancytopenia: Secondary | ICD-10-CM | POA: Diagnosis not present

## 2019-01-01 LAB — RHEUMATOID FACTOR: Rheumatoid fact SerPl-aCnc: <10 IU/mL (ref 0.0–13.9)

## 2019-01-01 LAB — PROTEIN ELECTROPHORESIS, SERUM
A/G Ratio: 1.5 (ref 0.7–1.7)
Albumin ELP: 3.6 g/dL (ref 2.9–4.4)
Alpha-1-Globulin: 0.2 g/dL (ref 0.0–0.4)
Alpha-2-Globulin: 0.4 g/dL (ref 0.4–1.0)
Beta Globulin: 0.7 g/dL (ref 0.7–1.3)
Gamma Globulin: 1.1 g/dL (ref 0.4–1.8)
Globulin, Total: 2.4 g/dL (ref 2.2–3.9)
Total Protein ELP: 6 g/dL (ref 6.0–8.5)

## 2019-01-02 LAB — COPPER, SERUM: Copper: 113 ug/dL (ref 72–166)

## 2019-01-05 LAB — METHYLMALONIC ACID, SERUM: Methylmalonic Acid, Quantitative: 133 nmol/L (ref 0–378)

## 2019-01-05 LAB — ANTINUCLEAR ANTIBODIES, IFA: ANA Ab, IFA: NEGATIVE

## 2019-01-06 ENCOUNTER — Encounter (INDEPENDENT_AMBULATORY_CARE_PROVIDER_SITE_OTHER): Payer: Self-pay | Admitting: *Deleted

## 2019-01-12 DIAGNOSIS — K760 Fatty (change of) liver, not elsewhere classified: Secondary | ICD-10-CM | POA: Diagnosis not present

## 2019-01-13 ENCOUNTER — Encounter (INDEPENDENT_AMBULATORY_CARE_PROVIDER_SITE_OTHER): Payer: Self-pay | Admitting: Internal Medicine

## 2019-01-13 ENCOUNTER — Ambulatory Visit (INDEPENDENT_AMBULATORY_CARE_PROVIDER_SITE_OTHER): Payer: PPO | Admitting: Internal Medicine

## 2019-01-13 ENCOUNTER — Other Ambulatory Visit: Payer: Self-pay

## 2019-01-13 VITALS — BP 123/69 | HR 81 | Temp 98.8°F | Resp 18 | Ht 72.0 in | Wt 198.6 lb

## 2019-01-13 DIAGNOSIS — K760 Fatty (change of) liver, not elsewhere classified: Secondary | ICD-10-CM | POA: Diagnosis not present

## 2019-01-13 DIAGNOSIS — K219 Gastro-esophageal reflux disease without esophagitis: Secondary | ICD-10-CM | POA: Diagnosis not present

## 2019-01-13 DIAGNOSIS — K7460 Unspecified cirrhosis of liver: Secondary | ICD-10-CM | POA: Diagnosis not present

## 2019-01-13 LAB — HEPATIC FUNCTION PANEL
AG Ratio: 1.7 (calc) (ref 1.0–2.5)
ALT: 24 U/L (ref 9–46)
AST: 22 U/L (ref 10–35)
Albumin: 3.9 g/dL (ref 3.6–5.1)
Alkaline phosphatase (APISO): 178 U/L — ABNORMAL HIGH (ref 35–144)
Bilirubin, Direct: 0.4 mg/dL — ABNORMAL HIGH (ref 0.0–0.2)
Globulin: 2.3 g/dL (calc) (ref 1.9–3.7)
Indirect Bilirubin: 1.1 mg/dL (calc) (ref 0.2–1.2)
Total Bilirubin: 1.5 mg/dL — ABNORMAL HIGH (ref 0.2–1.2)
Total Protein: 6.2 g/dL (ref 6.1–8.1)

## 2019-01-13 NOTE — Progress Notes (Signed)
Presenting complaint;  Follow-up for chronic liver disease.  Patient also has GERD complicated by short segment Barrett's esophagus.  Database and subjective:  Patient is 63 year old Caucasian male who has cirrhosis secondary to [place] who is here for scheduled visit.  He was last seen on 07/15/2018 when he weighed 207 pounds. He was in usual state of health until 12/20/2018 when he developed left sided pleuritic chest pain.  He was evaluated in emergency room.  He had low-grade fever.  He was also noted to be in atrial fibrillation which was new.  CBC was pertinent for WBC of 1000 and ANC was 500.  Patient was therefore transferred to [facility].  He converted to normal sinus rhythm with Cardizem.  Troponin levels were negative.  Regarding his chest pain and fever and leukopenia he had blood cultures as well as COVID testing twice and these were negative.  He was discharged 1 day later to be followed by Dr. Delton Coombes next week.  He was seen by Ms. Setser NP 2 weeks ago and his WBC was up to 2.4. In the meantime he has undergone multiple studies which include normal B12 and folate levels normal methylmalonic acid negative ANA and rheumatoid factor and normal serum copper levels.  Finally he also had serum protein electrophoresis and was within normal limits.  Patient feels much better.  He states he lost weight down to 193 pounds but he has gained 5 pounds back.  His appetite is back to normal.  He denies abdominal pain melena or rectal bleeding. He is up-to-date on hepatitis A and B vaccination. Patient is convinced that he had a viral infection that he must have acquired from 1 of his children.  He states both his daughters and son-in-law had similar symptoms.  He states his youngest daughter works at a [employer]. Patient wonders if he should continue metoprolol.  He is not having any side effects. Because of his leukopenia he was advised to stop all of his medications.  He is watching his diet and he  is not having any heartburn or indigestion. Patient states his hyperpigmentation or vitiligo has progressed in the last 6 months.  He is not having any itching.   Current Medications: Outpatient Encounter Medications as of 01/13/2019  Medication Sig  . metoprolol tartrate (LOPRESSOR) 25 MG tablet Take 0.5 tablets (12.5 mg total) by mouth 2 (two) times daily.   No facility-administered encounter medications on file as of 01/13/2019.      Objective: Blood pressure 123/69, pulse 81, temperature 98.8 F (37.1 C), temperature source Oral, resp. rate 18, height 6' (1.829 m), weight 198 lb 9.6 oz (90.1 kg). Patient is alert and in no acute distress. He does not have asterixis. Conjunctiva is pink. Sclera is nonicteric Oropharyngeal mucosa is normal. No neck masses or thyromegaly noted. Cardiac exam with regular rhythm normal S1 and S2. No murmur or gallop noted. Lungs are clear to auscultation. Abdomen is symmetrical and soft.  Liver edge is palpable below RCM.  Spleen is easily palpable.  No masses or tenderness. No LE edema or clubbing noted. He has vitiligo involving both his hands forearms arms as well as his neck.  Labs/studies Results:  CBC Latest Ref Rng & Units 12/31/2018 12/24/2018 12/20/2018  WBC 4.0 - 10.5 K/uL 2.4(L) 2.0(L) 1.0(LL)  Hemoglobin 13.0 - 17.0 g/dL 13.7 12.4(L) 14.0  Hematocrit 39.0 - 52.0 % 41.9 35.8(L) 40.6  Platelets 150 - 400 K/uL PLATELET CLUMPS NOTED ON SMEAR, UNABLE TO ESTIMATE 62(L) 69(L)  CMP Latest Ref Rng & Units 01/12/2019 12/24/2018 12/20/2018  Glucose 70 - 99 mg/dL - - 219(H)  BUN 8 - 23 mg/dL - - 17  Creatinine 0.61 - 1.24 mg/dL - - 0.82  Sodium 135 - 145 mmol/L - - 135  Potassium 3.5 - 5.1 mmol/L - - 3.8  Chloride 98 - 111 mmol/L - - 104  CO2 22 - 32 mmol/L - - 22  Calcium 8.9 - 10.3 mg/dL - - 8.3(L)  Total Protein 6.1 - 8.1 g/dL 6.2 5.7(L) 6.5  Total Bilirubin 0.2 - 1.2 mg/dL 1.5(H) 1.5(H) 3.6(H)  Alkaline Phos 38 - 126 U/L - - 197(H)  AST 10  - 35 U/L 22 41(H) 52(H)  ALT 9 - 46 U/L 24 34 43    Hepatic Function Latest Ref Rng & Units 01/12/2019 12/24/2018 12/20/2018  Total Protein 6.1 - 8.1 g/dL 6.2 5.7(L) 6.5  Albumin 3.5 - 5.0 g/dL - - 3.7  AST 10 - 35 U/L 22 41(H) 52(H)  ALT 9 - 46 U/L 24 34 43  Alk Phosphatase 38 - 126 U/L - - 197(H)  Total Bilirubin 0.2 - 1.2 mg/dL 1.5(H) 1.5(H) 3.6(H)  Bilirubin, Direct 0.0 - 0.2 mg/dL 0.4(H) 0.4(H) -    Lab Results  Component Value Date   CRP 2.5 08/21/2016      Assessment:  #1.  Recent illness appears to be viral pleurisy resulting in profound leukopenia.  He has baseline leukopenia to begin with due to chronic liver disease and splenomegaly.  He is to follow-up with Dr. Delton Coombes to make sure he does not have additional or another mechanism for his leukopenia.  #2.  Chronic liver disease.  He has cirrhosis due to NASH he had a liver biopsy back in April 2014 revealing mild fibrosis.  Elastography a year later did not F3 or F4 disease.  Failed both liver biopsy and elastography underestimated disease severity.  Clinically he appears to have at least F3 disease with portal hypertension.  Last EGD was in June 2018 revealing grade 1 esophageal varices and PHG. He also has chronic thrombocytopenia secondary to liver disease and splenomegaly.  #3.  GERD complicated by short segment Barrett's esophagus.  He is presently on dietary measures.  At some point he will need to go back on a PPI.   Plan:  Patient can use famotidine OTC 20 mg p.o. twice daily as needed. Office visit in 6 months. Will consider EGD following his next visit.

## 2019-01-13 NOTE — Patient Instructions (Signed)
Can take famotidine OTC 20 mg twice daily as needed for heartburn.

## 2019-01-14 ENCOUNTER — Ambulatory Visit: Payer: Self-pay | Admitting: Family Medicine

## 2019-01-14 ENCOUNTER — Encounter: Payer: Self-pay | Admitting: Family Medicine

## 2019-01-14 ENCOUNTER — Ambulatory Visit (INDEPENDENT_AMBULATORY_CARE_PROVIDER_SITE_OTHER): Payer: PPO | Admitting: Family Medicine

## 2019-01-14 VITALS — BP 120/62 | HR 53 | Temp 98.7°F | Resp 16 | Wt 199.0 lb

## 2019-01-14 DIAGNOSIS — D61818 Other pancytopenia: Secondary | ICD-10-CM

## 2019-01-14 DIAGNOSIS — Z8679 Personal history of other diseases of the circulatory system: Secondary | ICD-10-CM

## 2019-01-14 NOTE — Progress Notes (Signed)
Patient: Stephen Burch Male    DOB: [birth year]   63 y.o.   MRN: [id number] Visit Date: 01/14/2019  Today's Provider: Mila Merry, MD   Chief Complaint  Patient presents with  . Hospitalization Follow-up  . Atrial Fibrillation  . Hyperglycemia   Subjective:     HPI  Follow up Hospitalization  Patient was admitted to [facility] on 12/20/2018 and discharged on 12/21/2018. He was treated for new onset Atrial fibrillation with RVR. Treatment for this included stopping Flonase, Glipizide and Protonix. Patient was started on Metoprolol 25mg  1/2 tablet twice daily and converted to SR while hospitalized.  He reports good compliance with treatment. He reports this condition is Improved.  ------------------------------------------------------------------------------------    Hyperglycemia, Follow-up:   Lab Results  Component Value Date   HGBA1C 6.1 (A) 11/14/2018   HGBA1C 6.2 (H) 07/22/2018   HGBA1C 5.9 (H) 05/01/2018   GLUCOSE 219 (H) 12/20/2018   GLUCOSE 158 (H) 07/22/2018   GLUCOSE 147 (H) 07/16/2018    Last seen for for this 2 months ago.  Management since then includes starting Glipizide 2.5mg  daily. Current symptoms include none and have been stable.  Weight trend: stable Prior visit with dietician: no Current diet: well balanced Current exercise: walking and weightlifting  Pertinent Labs:    Component Value Date/Time   CHOL 120 05/01/2018 0838   TRIG 100 05/01/2018 0838   CHOLHDL 2.9 05/01/2018 0838   CREATININE 0.82 12/20/2018 1549   CREATININE 0.83 07/16/2018 0820    Wt Readings from Last 3 Encounters:  01/14/19 199 lb (90.3 kg)  01/13/19 198 lb 9.6 oz (90.1 kg)  12/31/18 197 lb 14.4 oz (89.8 kg)    Allergies  Allergen Reactions  . Asa [Aspirin] Shortness Of Breath    "asthma symptoms"  . Penicillins Shortness Of Breath    Has patient had a PCN reaction causing immediate rash, facial/tongue/throat swelling, SOB or lightheadedness with  hypotension: Yes Has patient had a PCN reaction causing severe rash involving mucus membranes or skin necrosis: No Has patient had a PCN reaction that required hospitalization No Has patient had a PCN reaction occurring within the last 10 years: No If all of the above answers are "NO", then may proceed with Cephalosporin use. "asthma symptoms" Patient has tolerated cefazolin, ceftriaxo  . Vancomycin Anaphylaxis    Immediately following being turned into prone position and Vancomycin administration in the OR patient cardiac arrest w/  Vfib.     Current Outpatient Medications:  .  metoprolol tartrate (LOPRESSOR) 25 MG tablet, Take 0.5 tablets (12.5 mg total) by mouth 2 (two) times daily., Disp: 60 tablet, Rfl: 0  Review of Systems  Constitutional: Negative for appetite change, chills and fever.  Respiratory: Negative for chest tightness, shortness of breath and wheezing.   Cardiovascular: Negative for chest pain and palpitations.  Gastrointestinal: Negative for abdominal pain, nausea and vomiting.    Social History   Tobacco Use  . Smoking status: Former Smoker    Years: 3.00    Types: Cigarettes    Quit date: 10/10/1974    Years since quitting: 44.2  . Smokeless tobacco: Never Used  Substance Use Topics  . Alcohol use: Not Currently      Objective:   BP 120/62 (BP Location: Left Arm, Patient Position: Sitting, Cuff Size: Normal)   Pulse (!) 53   Temp 98.7 F (37.1 C) (Oral)   Resp 16   Wt 199 lb (90.3 kg)   SpO2 97%  Comment: room air  BMI 26.99 kg/m  Vitals:   01/14/19 0835  BP: 120/62  Pulse: (!) 53  Resp: 16  Temp: 98.7 F (37.1 C)  TempSrc: Oral  SpO2: 97%  Weight: 199 lb (90.3 kg)     Physical Exam   General Appearance:    Alert, cooperative, no distress  Eyes:    PERRL, conjunctiva/corneas clear, EOM's intact       Lungs:     Clear to auscultation bilaterally, respirations unlabored  Heart:    Normal heart rate. Regular rhythm. No murmurs, rubs, or  gallops.   Neurologic:   Awake, alert, oriented x 3. No apparent focal neurological           defect.            Assessment & Plan    1. History of atrial fibrillation No in SR, continue metoprolol. Follow up 3 months.   2. Pancytopenia (HCC) Likely secondary to cirrhosis. Follow up oncology as scheduled.      Mila Merry, MD  [facility]

## 2019-01-14 NOTE — Patient Instructions (Signed)
.   Please review the attached list of medications and notify my office if there are any errors.   . Please bring all of your medications to every appointment so we can make sure that our medication list is the same as yours.   . We will have flu vaccines available after Labor Day. Please go to your pharmacy or call the office in early September to schedule you flu shot.

## 2019-01-22 ENCOUNTER — Inpatient Hospital Stay (HOSPITAL_BASED_OUTPATIENT_CLINIC_OR_DEPARTMENT_OTHER): Payer: PPO | Admitting: Hematology

## 2019-01-22 ENCOUNTER — Encounter (HOSPITAL_COMMUNITY): Payer: Self-pay | Admitting: Hematology

## 2019-01-22 ENCOUNTER — Other Ambulatory Visit: Payer: Self-pay

## 2019-01-22 VITALS — BP 125/68 | HR 99 | Temp 97.7°F | Resp 18 | Wt 203.8 lb

## 2019-01-22 DIAGNOSIS — M129 Arthropathy, unspecified: Secondary | ICD-10-CM | POA: Diagnosis not present

## 2019-01-22 DIAGNOSIS — Z87891 Personal history of nicotine dependence: Secondary | ICD-10-CM | POA: Diagnosis not present

## 2019-01-22 DIAGNOSIS — K219 Gastro-esophageal reflux disease without esophagitis: Secondary | ICD-10-CM

## 2019-01-22 DIAGNOSIS — D61818 Other pancytopenia: Secondary | ICD-10-CM

## 2019-01-22 DIAGNOSIS — M19132 Post-traumatic osteoarthritis, left wrist: Secondary | ICD-10-CM | POA: Diagnosis not present

## 2019-01-22 DIAGNOSIS — K7469 Other cirrhosis of liver: Secondary | ICD-10-CM

## 2019-01-22 DIAGNOSIS — K449 Diaphragmatic hernia without obstruction or gangrene: Secondary | ICD-10-CM

## 2019-01-22 DIAGNOSIS — Z86010 Personal history of colonic polyps: Secondary | ICD-10-CM

## 2019-01-22 DIAGNOSIS — I700 Atherosclerosis of aorta: Secondary | ICD-10-CM

## 2019-01-22 DIAGNOSIS — M25532 Pain in left wrist: Secondary | ICD-10-CM | POA: Diagnosis not present

## 2019-01-22 DIAGNOSIS — Z86718 Personal history of other venous thrombosis and embolism: Secondary | ICD-10-CM

## 2019-01-22 DIAGNOSIS — J45909 Unspecified asthma, uncomplicated: Secondary | ICD-10-CM

## 2019-01-22 DIAGNOSIS — K7581 Nonalcoholic steatohepatitis (NASH): Secondary | ICD-10-CM

## 2019-01-22 DIAGNOSIS — Z87442 Personal history of urinary calculi: Secondary | ICD-10-CM

## 2019-01-22 DIAGNOSIS — G8929 Other chronic pain: Secondary | ICD-10-CM | POA: Diagnosis not present

## 2019-01-22 DIAGNOSIS — D731 Hypersplenism: Secondary | ICD-10-CM

## 2019-01-22 DIAGNOSIS — Z8674 Personal history of sudden cardiac arrest: Secondary | ICD-10-CM

## 2019-01-22 NOTE — Assessment & Plan Note (Signed)
1.  Pancytopenia: - History of low white count since August 2017.  Low platelet count since 2014. - Admitted to [facility] on 12/20/2018 with left upper quadrant pain, found to be in A. fib which resolved. - Denies any fevers, night sweats or weight loss in the last 6 months. -He was diagnosed with [place]ene Lineman cirrhosis and splenomegaly. - CT AP on 12/19/2016 showed splenomegaly.  Ultrasound of the abdomen on 12/21/2018 showed splenomegaly measuring 21.9 x 10.8 x 21 cm with a volume of 265 cc.  No spleen vein thrombosis. - EGD on 12/17/2016 showed normal proximal and mid esophagus.  Esophageal varices grade 1 were noted.  Portal hypertensive gastropathy.  Normal duodenum. - I repeated his blood work.  White count is 2.4.  Platelets were clumped.  Hemoglobin was normal.  LDH was mildly elevated at 208.  ANA and rheumatoid factor was negative.  Nutritional deficiencies were ruled out.  SPEP was negative. - Flow cytometry on 01/01/2019 showed relative abundance of gamma delta T cells.  There was predominance of T lymphocytes expressing CD2, CD3, CD5 and CD7 with lack of CD56.  Majority of T cells however show lack of CD4 or CD8 expression associated with gamma delta receptor positivity.  B-cell represents a minor population with no monoclonality.  This may indicate reactive/secondary process seen in acute and chronic inflammatory conditions or infections. - I do not recommend any further testing at this time.  I will plan to see him back in 6 months for follow-up.  If there is any significant changes in his counts, I would recommend repeating flow cytometry before doing T-cell receptor studies.  2.  Provoked DVT: - He had DVT of the lower extremity after knee surgery 12 years ago.  He was on limited duration anticoagulation.  No recurrence noted.

## 2019-01-22 NOTE — Progress Notes (Signed)
[name], [place] [address]   CLINIC:  Medical Oncology/Hematology  PCP:  Birdie Sons, [address] [place] [address] [phone]   REASON FOR VISIT:  Follow-up for pancytopenia  CURRENT THERAPY: Observation.    INTERVAL HISTORY:  [name] 63 y.o. male seen for follow-up of pancytopenia following recent hospital admission.  Appetite is 100%.  Energy levels are 50%.  He is having some pain in the left wrist.  He thinks he will most likely need surgery.  Denies any fevers, night sweats or weight loss in the last 6 months.  Denies any easy bruising or bleeding.  Denies any recurrent infections or hospitalizations.  No history of hematemesis or hematochezia.  No recent changes in bowel habits.    REVIEW OF SYSTEMS:  Review of Systems  All other systems reviewed and are negative.    PAST MEDICAL/SURGICAL HISTORY:  Past Medical History:  Diagnosis Date  . Arthritis    knees, wrists  . Barrett's esophagus   . Complication of anesthesia    02-22-2016 intraop cardiac arrest immediately following moving pt into prone position and vancomyocin administration , pt cardiac arrest w/ Vfib,  please refer to anesthesia record in epic  . Esophageal varices determined by endoscopy ([place]) 11/2016   grade 1  . GERD (gastroesophageal reflux disease)   . Hiatal hernia   . History of adenomatous polyp of colon   . History of asthma    as child  . History of DVT (deep vein thrombosis)    right lower extremitty post-op right total knee surgery 09/ 2010,  treated w/ coumadin  . History of esophageal dilatation 07/1998   for schatzski ring  . History of kidney stones   . History of staph infection 04/2016   MSSA infection of right total knee w/ sepsis  . Hx of cardiac arrest    02-22-2016  intraoperative, immediately following moving pt into prone position and vancomyocin administration, cardiac arrest w/ Vfib (referred to  anesthesia record in epic) pt extubated himself next day  . Liver cirrhosis secondary to NASH (nonalcoholic steatohepatitis) ([place])    NAFLD-- followed by dr Laural Golden---  liver bx 09-30-2012  mild portal and focal sinusoidal fibrosis  . Scapholunate advanced collapse of left wrist    deformity  . Thrombocytopenia ([place])    10-09-2017 per pt his platelets have always been low , followed by pcp, never been referred to hematologist   Past Surgical History:  Procedure Laterality Date  . APPLICATION OF WOUND VAC Right 02/22/2016   Procedure: APPLICATION OF WOUND VAC;  Surgeon: Vickey Huger, MD;  Location: [facility];  Service: Orthopedics;  Laterality: Right;  . CARDIAC CATHETERIZATION N/A 02/24/2016   Procedure: Left Heart Cath and Coronary Angiography;  Surgeon: Burnell Blanks, MD;  Location: [facility];  Service: Cardiovascular;  Laterality: N/A;  No angiographic evidence of CAD,  normal LVSF, ef 50-55%  . CARPECTOMY Left 10/17/2017   Procedure: LEFT WRIST PROXIMAL ROW CARPECTOMY WITH POSTEROR IMBROSSEOUS NERVE EXCISION;  Surgeon: Charlotte Crumb, MD;  Location: [facility];  Service: Orthopedics;  Laterality: Left;  AXILLARY BLOCK  . EAR CYST EXCISION Right 09/06/2014   Procedure: OPEN EXCISION BAKER'S CYST RIGHT KNEE;  Surgeon: Vickey Huger, MD;  Location: [facility];  Service: Orthopedics;  Laterality: Right;  . ESOPHAGEAL DILATION  1996/ 2000  . ESOPHAGOGASTRODUODENOSCOPY N/A 09/17/2012   Procedure: ESOPHAGOGASTRODUODENOSCOPY (EGD);  Surgeon: Rogene Houston,  MD;  [facility];  Service: Endoscopy;  Laterality: N/A;  200  . ESOPHAGOGASTRODUODENOSCOPY N/A 12/15/2015   Procedure: ESOPHAGOGASTRODUODENOSCOPY (EGD);  Surgeon: Rogene Houston, MD;  [facility];  Service: Endoscopy;  Laterality: N/A;  1245  . ESOPHAGOGASTRODUODENOSCOPY N/A 12/17/2016   Procedure: ESOPHAGOGASTRODUODENOSCOPY (EGD);  Surgeon: Rogene Houston, MD;  [facility];  Service:  Endoscopy;  Laterality: N/A;  210  . I&D KNEE WITH POLY EXCHANGE Right 04/02/2016   Procedure: IRRIGATION AND DEBRIDEMENT RIGHT KNEE WITH POLY EXCHANGE;  Surgeon: Vickey Huger, MD;  Location: [facility];  Service: Orthopedics;  Laterality: Right;  . INCISION AND DRAINAGE HEMATOMA POST LEFT  TOTAL KNEE  2012  . INGUINAL HERNIA REPAIR Bilateral 05/22/2013   Procedure: REPAIR OF RECURRENT INCARCERATED INGUINAL HERNIA WITH MESH RIGHT SIDE,  REPAIR OF RECURRENT INGUINAL HERNIA WITH MESH LEFT SIDE;  Surgeon: [name], MD;  Location: [facility];  Service: General;  Laterality: Bilateral;  . INGUINAL HERNIA REPAIR Bilateral 11/1997  . INSERTION OF MESH Bilateral 05/22/2013   Procedure: INSERTION OF MESH;  Surgeon: [name], MD;  Location: [facility];  Service: General;  Laterality: Bilateral;  . IRRIGATION AND DEBRIDEMENT KNEE Right 02/22/2016   Procedure: IRRIGATION AND DEBRIDEMENT KNEE;  Surgeon: Vickey Huger, MD;  Location: [facility];  Service: Orthopedics;  Laterality: Right;  . Irrigation and Debridement right knee  03/23/2009   Dr. Sabra Heck, [facility]  . KNEE ARTHROSCOPY W/ SYNOVECTOMY Right 01/30/2010   AND DEBRIDEMENT OF HETEROTOPIC  . LIVER BIOPSY  09/30/2012  . REVISION TOTAL KNEE ARTHROPLASTY Left fall 2012  . TONSILLECTOMY  child  . TOTAL KNEE ARTHROPLASTY Bilateral right 09/ 2010/  left 09-18-2010  . TRANSTHORACIC ECHOCARDIOGRAM  02/22/2016   ef 30-35% (per cardiac cath 02-24-2016 normal), severe hypokinesis of the mid-apicalanteroseptal and apical myocardium,  grade 1 diastolic dysfunction/ trivial PR and TR  . WRIST ARTHROPLASTY Left 10/17/2017   Procedure: CAPITATE RESURFACING ARTHROPLASTY;  Surgeon: Charlotte Crumb, MD;  Location: [facility];  Service: Orthopedics;  Laterality: Left;     SOCIAL HISTORY:  Social History   Socioeconomic History  . Marital status: Legally Separated    Spouse name: Not on file  . Number of children: 2  . Years of education: Not on file  .  Highest education level: 12th grade  Occupational History  . 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 Financial resource strain: Not hard at all  . Food insecurity    Worry: Never true    Inability: Never true  . Transportation needs    Medical: No    Non-medical: No  Tobacco Use  . Smoking status: Former Smoker    Years: 3.00    Types: Cigarettes    Quit date: 10/10/1974    Years since quitting: 44.3  . Smokeless tobacco: Never Used  Substance and Sexual Activity  . Alcohol use: Not Currently  . Drug use: No  . Sexual activity: Not on file  Lifestyle  . Physical activity    Days per week: Not on file    Minutes per session: Not on file  . Stress: Not at all  Relationships  . Social Herbalist on phone: Not on file    Gets together: Not on file    Attends religious service: Not on file    Active member of club or organization: Not on file  Attends meetings of clubs or organizations: Not on file    Relationship status: Not on file  . Intimate partner violence    Fear of current or ex partner: Not on file    Emotionally abused: Not on file    Physically abused: Not on file    Forced sexual activity: Not on file  Other Topics Concern  . Not on file  Social History Narrative   ** Merged History Encounter **       Pt lives alone.    FAMILY HISTORY:  Family History  Problem Relation Age of Onset  . Arthritis Father        died age 37  . GI Bleed Father        upper GI Bleed, non-ETOH cirrhosis  . Arthritis Brother   . Diabetes Brother        type 2  . Diabetes Paternal Uncle        type 2  . Heart attack Maternal Grandmother   . Heart attack Maternal Grandfather   . Colon cancer Neg Hx   . Colon polyps Neg Hx     CURRENT MEDICATIONS:  Outpatient Encounter Medications as of 01/22/2019  Medication Sig  . metoprolol tartrate (LOPRESSOR) 25 MG tablet Take 0.5 tablets (12.5 mg total) by mouth 2 (two)  times daily.   No facility-administered encounter medications on file as of 01/22/2019.     ALLERGIES:  Allergies  Allergen Reactions  . Asa [Aspirin] Shortness Of Breath    "asthma symptoms"  . Penicillins Shortness Of Breath    Has patient had a PCN reaction causing immediate rash, facial/tongue/throat swelling, SOB or lightheadedness with hypotension: Yes Has patient had a PCN reaction causing severe rash involving mucus membranes or skin necrosis: No Has patient had a PCN reaction that required hospitalization No Has patient had a PCN reaction occurring within the last 10 years: No If all of the above answers are "NO", then may proceed with Cephalosporin use. "asthma symptoms" Patient has tolerated cefazolin, ceftriaxo  . Vancomycin Anaphylaxis    Immediately following being turned into prone position and Vancomycin administration in the OR patient cardiac arrest w/  Vfib.     PHYSICAL EXAM:  ECOG Performance status: 0  Vitals:   01/22/19 1448  BP: 125/68  Pulse: 99  Resp: 18  Temp: 97.7 F (36.5 C)  SpO2: 98%   Filed Weights   01/22/19 1448  Weight: 203 lb 12.8 oz (92.4 kg)    Physical Exam Vitals signs reviewed.  Constitutional:      Appearance: Normal appearance.  Cardiovascular:     Rate and Rhythm: Normal rate and regular rhythm.     Heart sounds: Normal heart sounds.  Pulmonary:     Effort: Pulmonary effort is normal.     Breath sounds: Normal breath sounds.  Abdominal:     General: There is no distension.     Palpations: Abdomen is soft. There is no mass.  Musculoskeletal:        General: No swelling.  Skin:    General: Skin is warm.  Neurological:     General: No focal deficit present.     Mental Status: He is alert and oriented to person, place, and time.  Psychiatric:        Mood and Affect: Mood normal.        Behavior: Behavior normal.      LABORATORY DATA:  I have reviewed the labs as listed.  CBC  Component Value Date/Time    WBC 2.4 (L) 12/31/2018 1335   RBC 4.80 12/31/2018 1335   RBC 4.80 12/31/2018 1335   HGB 13.7 12/31/2018 1335   HGB 16.0 01/24/2017 0849   HCT 41.9 12/31/2018 1335   HCT 45.9 01/24/2017 0849   PLT PLATELET CLUMPS NOTED ON SMEAR, UNABLE TO ESTIMATE 12/31/2018 1335   PLT 82 (LL) 01/24/2017 0849   MCV 87.3 12/31/2018 1335   MCV 86 01/24/2017 0849   MCH 28.5 12/31/2018 1335   MCHC 32.7 12/31/2018 1335   RDW 13.9 12/31/2018 1335   RDW 14.3 01/24/2017 0849   LYMPHSABS 0.6 (L) 12/31/2018 1335   MONOABS 0.2 12/31/2018 1335   EOSABS 0.1 12/31/2018 1335   BASOSABS 0.0 12/31/2018 1335   CMP Latest Ref Rng & Units 01/12/2019 12/24/2018 12/20/2018  Glucose 70 - 99 mg/dL - - 219(H)  BUN 8 - 23 mg/dL - - 17  Creatinine 0.61 - 1.24 mg/dL - - 0.82  Sodium 135 - 145 mmol/L - - 135  Potassium 3.5 - 5.1 mmol/L - - 3.8  Chloride 98 - 111 mmol/L - - 104  CO2 22 - 32 mmol/L - - 22  Calcium 8.9 - 10.3 mg/dL - - 8.3(L)  Total Protein 6.1 - 8.1 g/dL 6.2 5.7(L) 6.5  Total Bilirubin 0.2 - 1.2 mg/dL 1.5(H) 1.5(H) 3.6(H)  Alkaline Phos 38 - 126 U/L - - 197(H)  AST 10 - 35 U/L 22 41(H) 52(H)  ALT 9 - 46 U/L 24 34 43       DIAGNOSTIC IMAGING:  I have independently reviewed the scans and discussed with the patient.     ASSESSMENT & PLAN:   Other pancytopenia ([place]) 1.  Pancytopenia: - History of low white count since August 2017.  Low platelet count since 2014. - Ad[facility] to [facility] 12/20/2018 with left upper quadrant pain, found to be in A. fib which resolved. - Denies any fevers, night sweats or weight loss in the last 6 months. -He was diagnosed with Karlene Lineman cirrhosis and splenomegaly. - CT [facility] on 12/19/2016 showed splenomegaly.  Ultrasound of the abdomen on 12/21/2018 showed splenomegaly measuring 21.9 x 10.8 x 21 cm with a volume of 265 cc.  No spleen vein thrombosis. - EGD on 12/17/2016 showed normal proximal and mid esophagus.  Esophageal varices grade 1 were noted.  Portal hypertensive  gastropathy.  Normal duodenum. - I repeated his blood work.  White count is 2.4.  Platelets were clumped.  Hemoglobin was normal.  LDH was mildly elevated at 208.  ANA and rheumatoid factor was negative.  Nutritional deficiencies were ruled out.  SPEP was negative. - Flow cytometry on 01/01/2019 showed relative abundance of gamma delta T cells.  There was predominance of T lymphocytes expressing CD2, CD3, CD5 and CD7 with lack of CD56.  Majority of T cells however show lack of CD4 or CD8 expression associated with gamma delta receptor positivity.  B-cell represents a minor population with no monoclonality.  This may indicate reactive/secondary process seen in acute and chronic inflammatory conditions or infections. - I do not recommend any further testing at this time.  I will plan to see him back in 6 months for follow-up.  If there is any significant changes in his counts, I would recommend repeating flow cytometry before doing T-cell receptor studies.  2.  Provoked DVT: - He had DVT of the lower extremity after knee surgery 12 years ago.  He was on limited duration anticoagulation.  No recurrence  noted.      Orders placed this encounter:  Orders Placed This Encounter  Procedures  . CBC with Differential/Platelet  . Comprehensive metabolic panel  . Lactate dehydrogenase      Derek Jack, MD [place] [phone]

## 2019-01-22 NOTE — Patient Instructions (Addendum)
[place] at [facility] Discharge Instructions  You were seen today by Dr. Delton Coombes. He talked about your most recent lab results.  He checked those to rule out certain diseases.  He did check a flow cytometry and it showed some T-cells that could indicate a reactive response. This can be infection or stress from recent events.  He is not concerned about this because you are not presenting any other symptoms such as night sweats, weight loss.  We will watch this as time progresses.  If you should develop any symptoms you can come back sooner.  He feels like the next time we check this, it will be normal.  We will see you back in 6 months with repeat lab work.  He discussed with you that you are okay to have surgery on your wrist.    Thank you for choosing [place] at [facility] to provide your oncology and hematology care.  To afford each patient quality time with our provider, please arrive at least 15 minutes before your scheduled appointment time.   If you have a lab appointment with the [facility] please come in thru the  Main Entrance and check in at the main information desk  You need to re-schedule your appointment should you arrive 10 or more minutes late.  We strive to give you quality time with our providers, and arriving late affects you and other patients whose appointments are after yours.  Also, if you no show three or more times for appointments you may be dismissed from the clinic at the providers discretion.     Again, thank you for choosing [facility].  Our hope is that these requests will decrease the amount of time that you wait before being seen by our physicians.       _____________________________________________________________  Should you have questions after your visit to [facility], please contact our office at [phone] between the hours of 8:00 a.m. and 4:30 p.m.  Voicemails left after  4:00 p.m. will not be returned until the following business day.  For prescription refill requests, have your pharmacy contact our office and allow 72 hours.    Cancer Center Support Programs:   > Cancer Support Group  2nd Tuesday of the month 1pm-2pm, Journey Room

## 2019-01-29 ENCOUNTER — Telehealth: Payer: Self-pay

## 2019-01-29 NOTE — Telephone Encounter (Signed)
Called pt to r/s his AWV telephonically and pt declined and stated he was getting ready to have wrist surgery again. Pt to CB when he is ready to schedule.  -MM

## 2019-02-04 ENCOUNTER — Other Ambulatory Visit: Payer: Self-pay | Admitting: Orthopedic Surgery

## 2019-02-05 ENCOUNTER — Encounter (HOSPITAL_COMMUNITY): Payer: Self-pay | Admitting: *Deleted

## 2019-02-05 NOTE — Progress Notes (Addendum)
Anesthesia Chart Review: SAME DAY WORK-UP   Case: [id number] Date/Time: 02/11/19 1245   Procedures:      LEFT WRIST STENOSING TENOSYNOVITIS RELEASE (Left ) - MAC WITH AXILLARY BLOCK     LEFT WRIST RADIAL STYLOIDECTOMY (Left )   Anesthesia type: Monitor Anesthesia Care   Pre-op diagnosis: LEFT WRIST STENOSING TENOSYNOVITIS, LEFT WRIST PAIN M19.132 M25.532   Location: [facility] OR ROOM 02 / [place] [facility]   Surgeon: Charlotte Crumb, MD     DISCUSSION: Patient is a 63 year old male scheduled for the above procedure. He underwent left wrist capitate resurfacing arthroplasty on 10/17/17, but now with persistent complaints of pain over the pain and discomfort over the dorsal radial aspect of the wrist on the left as well as pain with radial deviation.  Imaging at Dr. Bertis Ruddy office showed "a small cystic area at the lunate fossa of the radius" and "loss of space between the radial styloid and the distal carpal row which were a change from previous. The above surgery recommended.    History includes former smoker (quit 1976), asthma, GERD, hiatal hernia, DVT (post-op, 2010), childhood asthma, liver cirrhosis secondary to NASH (with esophageal varies, splenomegaly, thrombocytopenia with PLT count ~ 60-90K), intraoperative cardiac arrest (See below, likely from vancomycin reaction 02/22/16; EF 30-35%-->50-55%, normal coronaries 02/24/16), afib (12/20/18), hyperglycemia (A1c 6.1% 11/14/18).  - 10/17/17: S/p left wrist proximal row carpectomy, posterior interosseous nerve neurectomy, and capitate resurfacing arthroplasty using the wrist motion Arthrosurface implant (10/17/17). - 04/02/16: S/p right open revision TKA with open synovectomy I&D.  - 02/22/16: S/p right open I&D of baker's cyst with VAC sponge placement. Within minutes of the procedure, ST elevation noted on monitor with difficulty ventilating. He developed hypotension without a pulse. He was turned from a prone position and CPR started and given Epinephrine x1. He then  developed vfib s/p successful defibrillation. He was transferred to SICU and self-extubated overnight. Echo showed EF 30-35% and had mildly elevated troponin, so cardiology consulted and seen by Dr. Acie Fredrickson. Cardiac cath showed normal coronaries with recovery of EF.  Anesthesiologist notes indicated that intraoperative arrest likely from vancomycin reaction. (For 04/02/16 and 10/17/17 surgeries, it appears that clindamycin was used.) - 09/06/14: S/p right open Baker Cyst removal.  - Admission 12/20/18-12/21/18 with new onset afib with RVR and atypical chest pain. Converted to SR with Cardizem gtt. Troponin negative x2. Negative UDS. CTA negative for PE. Cardiology team was consulted.  Seen by Dr. Debara Pickett who did not recommend anticoagulation (due to thrombocytopenia, varices). Echo showed normal LVEF. Low dose b-blocker started. TSH was < 0.010 with elevated Free T4 1.56 (0.61-1.12) and normal total T3 of 161 (71-180). Cardiology deferred thyroid function correction per primary team (no specific recommendations seen). Due to significant neutropenia, hematology team was consulted, Dr. Alen Blew and out-patient follow-up arranged.    Patient no longer on b-blocker therapy. He did not tolerate side effects. Also according to 01/13/19 note by Dr. Laural Golden, it appears he was instructed to stop all of his medication because of his leukopenia.   Reviewed case with anesthesiologist Oleta Mouse, MD. Patient with known cirrhosis with pancytopenia but with recent GI and hematology visits. He was not tachycardic at these visits. Will get updated baseline EKG on the day of surgery given last one showed afib. I also sent a high priority staff message to patient's PCP Fisher, Kirstie Peri, MD regarding future follow-up on patient's abnormal thyroid studies, particularly since hyperthyroidism could contribute to afib. If patient without clinical symptoms of hyperthyroidism, is  not in recurrent rapid afib, and labs acceptable, then he  can likely proceed as planned. Anesthesiologist to evaluate on the day of surgery--definitive anesthesia plan at that time. I also left my contact information for Dr. Caryn Section if needed.   Presurgical COVID-19 test is scheduled for 02/09/19.    VS:  Wt Readings from Last 3 Encounters:  01/22/19 92.4 kg  01/14/19 90.3 kg  01/13/19 90.1 kg   Temp Readings from Last 3 Encounters:  01/22/19 36.5 C (Oral)  01/14/19 37.1 C (Oral)  01/13/19 37.1 C (Oral)   BP Readings from Last 3 Encounters:  01/22/19 125/68  01/14/19 120/62  01/13/19 123/69   Pulse Readings from Last 3 Encounters:  01/22/19 99  01/14/19 (!) 53  01/13/19 81    PROVIDERS: Birdie Sons, MD is PCP. Last visit 01/14/19 for hospital follow-up.  Mertie Moores, MD is cardiologist. Last seen by Pixie Casino, MD on 12/21/18 during afib hospitalization--no specific follow-up recommendations seen. Derek Jack, MD is hematologist. Last visit 01/22/19 for pancytopenia.  His note outlines testing done. No further hematologic testing recommended.  Six-month follow-up planned. Hildred Laser, MD is GI. Last visit 01/13/19. Six month follow-up recommended.    LABS: He is for updated labs on arrival. Most recent comparison labs include: Lab Results  Component Value Date   WBC 2.4 (L) 12/31/2018   HGB 13.7 12/31/2018   HCT 41.9 12/31/2018   PLT PLATELET CLUMPS NOTED ON SMEAR, UNABLE TO ESTIMATE 12/31/2018   GLUCOSE 219 (H) 12/20/2018   ALT 24 01/12/2019   AST 22 01/12/2019   NA 135 12/20/2018   K 3.8 12/20/2018   CL 104 12/20/2018   CREATININE 0.82 12/20/2018   BUN 17 12/20/2018   CO2 22 12/20/2018   TSH <0.010 (L) 12/20/2018   INR 1.3 (H) 12/20/2018   HGBA1C 6.1 (A) 11/14/2018   Platelet count 62-94K since 12/2016, in 12/2018 was 62-69K.   IMAGES: US abdomen/spleen 12/21/18: IMPRESSION: 1. No evidence of splenic vein thrombosis. Splenic arterial and venous blood flow is seen. 2. Prominent  splenomegaly as seen on CT.  CTabd/pelvis 12/20/18: IMPRESSION: 1. No evidence of acute abnormality 2. Moderate to marked splenomegaly with splenic volume of 2000 cc, doubled since 2017. 3. Question cirrhosis. 4.  Aortic Atherosclerosis (ICD10-I70.0).  CTA chest 12/20/18: IMPRESSION: Negative for pulmonary embolus or acute cardiopulmonary disease. Marked splenomegaly appears slightly worse than on the prior CT.  PCXR 12/20/18: IMPRESSION: Suspected mild airways disease / bronchitis on this AP portable examination. No focal airspace opacities to suggest pneumonia. Further evaluation with a PA and lateral chest radiograph may be obtained as clinically indicated.   EKG: Will get updated EKG on the day of surgery. EKG 12/20/18: Afib with RVR at 159 bpm EKG 04/02/16: NSR   CV: Echo 12/21/18: IMPRESSIONS  1. The left ventricle has normal systolic function with an ejection fraction of 60-65%. The cavity size was normal. Left ventricular diastolic parameters were normal. No evidence of left ventricular regional wall motion abnormalities.  2. The right ventricle has normal systolic function. The cavity was normal. There is no increase in right ventricular wall thickness.  3. The mitral valve is grossly normal.  4. The tricuspid valve is grossly normal.  5. The aortic valve is tricuspid. No stenosis of the aortic valve.  Cardiac cath 02/24/16: 1. No angiographic evidence of CAD 2. Normal LV systolic function Recommendations: No further ischemic workup.    Past Medical History:  Diagnosis Date  .  Arthritis    knees, wrists  . Barrett's esophagus   . Complication of anesthesia    02-22-2016 intraop cardiac arrest immediately following moving pt into prone position and vancomyocin administration , pt cardiac arrest w/ Vfib,  please refer to anesthesia record in epic  . Dysrhythmia    Afib 12/2018 - converted to NSR  . Esophageal varices determined by endoscopy ([place]) 11/2016   grade  1  . GERD (gastroesophageal reflux disease)   . Hiatal hernia   . History of adenomatous polyp of colon   . History of asthma    as child  . History of DVT (deep vein thrombosis)    right lower extremitty post-op right total knee surgery 09/ 2010,  treated w/ coumadin  . History of esophageal dilatation 07/1998   for schatzski ring  . History of kidney stones   . History of staph infection 04/2016   MSSA infection of right total knee w/ sepsis  . Hx of cardiac arrest    02-22-2016  intraoperative, immediately following moving pt into prone position and vancomyocin administration, cardiac arrest w/ Vfib (referred to anesthesia record in epic) pt extubated himself next day  . Liver cirrhosis secondary to NASH (nonalcoholic steatohepatitis) ([place])    NAFLD-- followed by dr Laural Golden---  liver bx 09-30-2012  mild portal and focal sinusoidal fibrosis  . Scapholunate advanced collapse of left wrist    deformity  . Thrombocytopenia ([place])    10-09-2017 per pt his platelets have always been low , followed by pcp, never been referred to hematologist    Past Surgical History:  Procedure Laterality Date  . APPLICATION OF WOUND VAC Right 02/22/2016   Procedure: APPLICATION OF WOUND VAC;  Surgeon: Vickey Huger, MD;  Location: [facility];  Service: Orthopedics;  Laterality: Right;  . CARDIAC CATHETERIZATION N/A 02/24/2016   Procedure: Left Heart Cath and Coronary Angiography;  Surgeon: Burnell Blanks, MD;  Location: [facility];  Service: Cardiovascular;  Laterality: N/A;  No angiographic evidence of CAD,  normal LVSF, ef 50-55%  . CARPECTOMY Left 10/17/2017   Procedure: LEFT WRIST PROXIMAL ROW CARPECTOMY WITH POSTEROR IMBROSSEOUS NERVE EXCISION;  Surgeon: Charlotte Crumb, MD;  Location: [facility];  Service: Orthopedics;  Laterality: Left;  AXILLARY BLOCK  . COLONOSCOPY    . EAR CYST EXCISION Right 09/06/2014   Procedure: OPEN EXCISION BAKER'S CYST RIGHT KNEE;  Surgeon: Vickey Huger, MD;  Location: [facility];  Service: Orthopedics;  Laterality: Right;  . ESOPHAGEAL DILATION  1996/ 2000  . ESOPHAGOGASTRODUODENOSCOPY N/A 09/17/2012   Procedure: ESOPHAGOGASTRODUODENOSCOPY (EGD);  Surgeon: Rogene Houston, MD;  [facility];  Service: Endoscopy;  Laterality: N/A;  200  . ESOPHAGOGASTRODUODENOSCOPY N/A 12/15/2015   Procedure: ESOPHAGOGASTRODUODENOSCOPY (EGD);  Surgeon: Rogene Houston, MD;  [facility];  Service: Endoscopy;  Laterality: N/A;  1245  . ESOPHAGOGASTRODUODENOSCOPY N/A 12/17/2016   Procedure: ESOPHAGOGASTRODUODENOSCOPY (EGD);  Surgeon: Rogene Houston, MD;  [facility];  Service: Endoscopy;  Laterality: N/A;  210  . I&D KNEE WITH POLY EXCHANGE Right 04/02/2016   Procedure: IRRIGATION AND DEBRIDEMENT RIGHT KNEE WITH POLY EXCHANGE;  Surgeon: Vickey Huger, MD;  Location: [place];  Service: Orthopedics;  Laterality: Right;  . INCISION AND DRAINAGE HEMATOMA POST LEFT  TOTAL KNEE  2012  . INGUINAL HERNIA REPAIR Bilateral 05/22/2013   Procedure: REPAIR OF RECURRENT INCARCERATED INGUINAL HERNIA WITH MESH RIGHT SIDE,  REPAIR OF RECURRENT INGUINAL HERNIA WITH MESH LEFT SIDE;  Surgeon: [name], MD;  Location: [facility];  Service: General;  Laterality: Bilateral;  . INGUINAL HERNIA REPAIR Bilateral 11/1997  . INSERTION OF MESH Bilateral 05/22/2013   Procedure: INSERTION OF MESH;  Surgeon: [name], MD;  Location: [place];  Service: General;  Laterality: Bilateral;  . IRRIGATION AND DEBRIDEMENT KNEE Right 02/22/2016   Procedure: IRRIGATION AND DEBRIDEMENT KNEE;  Surgeon: Vickey Huger, MD;  Location: [place];  Service: Orthopedics;  Laterality: Right;  . Irrigation and Debridement right knee  03/23/2009   Dr. Sabra Heck, [address] [facility]  . KNEE ARTHROSCOPY W/ SYNOVECTOMY Right 01/30/2010   AND DEBRIDEMENT OF HETEROTOPIC  . LIVER BIOPSY  09/30/2012  . REVISION TOTAL KNEE ARTHROPLASTY Left fall 2012  . TONSILLECTOMY  child  . TOTAL KNEE ARTHROPLASTY  Bilateral right 09/ 2010/  left 09-18-2010  . TRANSTHORACIC ECHOCARDIOGRAM  02/22/2016   ef 30-35% (per cardiac cath 02-24-2016 normal), severe hypokinesis of the mid-apicalanteroseptal and apical myocardium,  grade 1 diastolic dysfunction/ trivial PR and TR  . WRIST ARTHROPLASTY Left 10/17/2017   Procedure: CAPITATE RESURFACING ARTHROPLASTY;  Surgeon: Charlotte Crumb, MD;  Location: [facility];  Service: Orthopedics;  Laterality: Left;    MEDICATIONS: No current facility-administered medications for this encounter.    . metoprolol tartrate (LOPRESSOR) 25 MG tablet  He is not currently taking metoprolol.   Myra Gianotti, PA-C Surgical Short Stay/Anesthesiology [facility] Phone [phone] [facility] Phone [phone] 02/05/2019 7:01 PM

## 2019-02-05 NOTE — Progress Notes (Addendum)
Mr Stephen Burch denies chest pain or shortness of breath. Patient denies any S?S of Covid, patient is scheduled for Covid test on 02/09/2019 at [facility]  PCP Dr. [name] Cardiologist Dr Sondra Come.  Mr Stephen Burch stopped taking Metoprolol approximately 2 week ago, "I didn't like the way it made me feel." I informed patient that anesthesiologist want patients to that Beta Blocker the am of surgery.  Patient asked, "should I start back on it?"  I said , you really need to talk to your cardiologist prior to stopping the medication. Mr Stephen Burch did not say if he is going to start Metoprolol back or not.  I did instruct him to take Metoprolol the day of surgery , if he starts it back.  I instructed patient to stop eating at midnight 8/11.  I instructed patient he/ she may drink clear liquids until 3 hours prior to surgery - 1000. We discussed what clear liquids SKA:[id number] Beverages, Water, Clear Tea, Black Coffee only, Juice (non-citric and without pulp), Gatorade, Plain Jell-O  only, Plain Popsicles only. Mr Stephen Burch lives in [place] and will not be able to  Come to [place] to pick up Ensure- Pre- Surgery, I asked patient to make sure he drinks something with carbohydrates before the 1000 cut off time.  Mr Stephen Burch voiced understanding.  I asked anesthesiology PA- C to review.

## 2019-02-09 ENCOUNTER — Other Ambulatory Visit (HOSPITAL_COMMUNITY)
Admission: RE | Admit: 2019-02-09 | Discharge: 2019-02-09 | Disposition: A | Discharge status: Home / Self Care | Payer: PPO | Source: Ambulatory Visit | Attending: Orthopedic Surgery | Admitting: Orthopedic Surgery

## 2019-02-09 ENCOUNTER — Other Ambulatory Visit: Payer: Self-pay

## 2019-02-09 DIAGNOSIS — M19132 Post-traumatic osteoarthritis, left wrist: Secondary | ICD-10-CM | POA: Diagnosis not present

## 2019-02-09 DIAGNOSIS — M25532 Pain in left wrist: Secondary | ICD-10-CM | POA: Insufficient documentation

## 2019-02-09 DIAGNOSIS — Z01812 Encounter for preprocedural laboratory examination: Secondary | ICD-10-CM | POA: Diagnosis not present

## 2019-02-09 DIAGNOSIS — Z20828 Contact with and (suspected) exposure to other viral communicable diseases: Secondary | ICD-10-CM | POA: Insufficient documentation

## 2019-02-09 LAB — SARS CORONAVIRUS 2 (TAT 6-24 HRS): SARS Coronavirus 2: NEGATIVE

## 2019-02-11 ENCOUNTER — Ambulatory Visit (HOSPITAL_COMMUNITY): Payer: PPO | Admitting: Vascular Surgery

## 2019-02-11 ENCOUNTER — Other Ambulatory Visit: Payer: Self-pay

## 2019-02-11 ENCOUNTER — Ambulatory Visit (HOSPITAL_COMMUNITY)
Admission: RE | Admit: 2019-02-11 | Discharge: 2019-02-11 | Disposition: A | Discharge status: Home / Self Care | Payer: PPO | Attending: Orthopedic Surgery | Admitting: Orthopedic Surgery

## 2019-02-11 ENCOUNTER — Encounter (HOSPITAL_COMMUNITY): Payer: Self-pay | Admitting: Surgery

## 2019-02-11 ENCOUNTER — Encounter (HOSPITAL_COMMUNITY)
Admission: RE | Disposition: A | Discharge status: Home / Self Care | Payer: Self-pay | Source: Home / Self Care | Attending: Orthopedic Surgery

## 2019-02-11 DIAGNOSIS — I4891 Unspecified atrial fibrillation: Secondary | ICD-10-CM | POA: Diagnosis not present

## 2019-02-11 DIAGNOSIS — Z87891 Personal history of nicotine dependence: Secondary | ICD-10-CM | POA: Insufficient documentation

## 2019-02-11 DIAGNOSIS — M19031 Primary osteoarthritis, right wrist: Secondary | ICD-10-CM | POA: Diagnosis not present

## 2019-02-11 DIAGNOSIS — I5021 Acute systolic (congestive) heart failure: Secondary | ICD-10-CM | POA: Diagnosis not present

## 2019-02-11 DIAGNOSIS — J45909 Unspecified asthma, uncomplicated: Secondary | ICD-10-CM | POA: Insufficient documentation

## 2019-02-11 DIAGNOSIS — Z87442 Personal history of urinary calculi: Secondary | ICD-10-CM | POA: Insufficient documentation

## 2019-02-11 DIAGNOSIS — M170 Bilateral primary osteoarthritis of knee: Secondary | ICD-10-CM | POA: Diagnosis not present

## 2019-02-11 DIAGNOSIS — I469 Cardiac arrest, cause unspecified: Secondary | ICD-10-CM | POA: Diagnosis not present

## 2019-02-11 DIAGNOSIS — M6588 Other synovitis and tenosynovitis, other site: Secondary | ICD-10-CM | POA: Insufficient documentation

## 2019-02-11 DIAGNOSIS — M25532 Pain in left wrist: Secondary | ICD-10-CM | POA: Diagnosis not present

## 2019-02-11 DIAGNOSIS — Z79899 Other long term (current) drug therapy: Secondary | ICD-10-CM | POA: Diagnosis not present

## 2019-02-11 DIAGNOSIS — M25832 Other specified joint disorders, left wrist: Secondary | ICD-10-CM | POA: Diagnosis not present

## 2019-02-11 DIAGNOSIS — M19032 Primary osteoarthritis, left wrist: Secondary | ICD-10-CM | POA: Diagnosis not present

## 2019-02-11 DIAGNOSIS — K7581 Nonalcoholic steatohepatitis (NASH): Secondary | ICD-10-CM | POA: Diagnosis not present

## 2019-02-11 DIAGNOSIS — Z8674 Personal history of sudden cardiac arrest: Secondary | ICD-10-CM | POA: Diagnosis not present

## 2019-02-11 DIAGNOSIS — M65842 Other synovitis and tenosynovitis, left hand: Secondary | ICD-10-CM | POA: Diagnosis not present

## 2019-02-11 DIAGNOSIS — K219 Gastro-esophageal reflux disease without esophagitis: Secondary | ICD-10-CM | POA: Insufficient documentation

## 2019-02-11 DIAGNOSIS — Z86718 Personal history of other venous thrombosis and embolism: Secondary | ICD-10-CM | POA: Insufficient documentation

## 2019-02-11 HISTORY — PX: CARPECTOMY WITH RADIAL STYLOIDECTOMY: SHX5609

## 2019-02-11 HISTORY — PX: TRIGGER FINGER RELEASE: SHX641

## 2019-02-11 HISTORY — DX: Cardiac arrhythmia, unspecified: I49.9

## 2019-02-11 LAB — COMPREHENSIVE METABOLIC PANEL
ALT: 24 U/L (ref 0–44)
AST: 27 U/L (ref 15–41)
Albumin: 3.8 g/dL (ref 3.5–5.0)
Alkaline Phosphatase: 142 U/L — ABNORMAL HIGH (ref 38–126)
Anion gap: 8 (ref 5–15)
BUN: 11 mg/dL (ref 8–23)
CO2: 23 mmol/L (ref 22–32)
Calcium: 8.6 mg/dL — ABNORMAL LOW (ref 8.9–10.3)
Chloride: 108 mmol/L (ref 98–111)
Creatinine, Ser: 0.79 mg/dL (ref 0.61–1.24)
GFR calc Af Amer: >60 mL/min (ref >60)
GFR calc non Af Amer: >60 mL/min (ref >60)
Glucose, Bld: 139 mg/dL — ABNORMAL HIGH (ref 70–99)
Potassium: 4.1 mmol/L (ref 3.5–5.1)
Sodium: 139 mmol/L (ref 135–145)
Total Bilirubin: 1.7 mg/dL — ABNORMAL HIGH (ref 0.3–1.2)
Total Protein: 6.4 g/dL — ABNORMAL LOW (ref 6.5–8.1)

## 2019-02-11 LAB — CBC
HCT: 43.6 % (ref 39.0–52.0)
Hemoglobin: 14.8 g/dL (ref 13.0–17.0)
MCH: 29.1 pg (ref 26.0–34.0)
MCHC: 33.9 g/dL (ref 30.0–36.0)
MCV: 85.8 fL (ref 80.0–100.0)
Platelets: DECREASED 10*3/uL (ref 150–400)
RBC: 5.08 MIL/uL (ref 4.22–5.81)
RDW: 13.5 % (ref 11.5–15.5)
WBC: 2.4 10*3/uL — ABNORMAL LOW (ref 4.0–10.5)
nRBC: 0 % (ref 0.0–0.2)

## 2019-02-11 LAB — GLUCOSE, CAPILLARY: Glucose-Capillary: 117 mg/dL — ABNORMAL HIGH (ref 70–99)

## 2019-02-11 SURGERY — RELEASE, A1 PULLEY, FOR TRIGGER FINGER
Anesthesia: Monitor Anesthesia Care | Site: Wrist | Laterality: Left

## 2019-02-11 MED ORDER — PROMETHAZINE HCL 25 MG/ML IJ SOLN: 6.2500 mg | INTRAMUSCULAR | Status: DC | PRN

## 2019-02-11 MED ORDER — FENTANYL CITRATE (PF) 100 MCG/2ML IJ SOLN
50.0000 ug | Freq: Once | INTRAMUSCULAR | Status: AC
  Administered 2019-02-11: 50 ug via INTRAVENOUS

## 2019-02-11 MED ORDER — MIDAZOLAM HCL 2 MG/2ML IJ SOLN
1.0000 mg | Freq: Once | INTRAMUSCULAR | Status: AC
  Administered 2019-02-11: 1 mg via INTRAVENOUS

## 2019-02-11 MED ORDER — OXYCODONE-ACETAMINOPHEN 5-325 MG PO TABS: 1.0000 | ORAL_TABLET | ORAL | 0 refills | Status: DC | PRN

## 2019-02-11 MED ORDER — MIDAZOLAM HCL 2 MG/2ML IJ SOLN
INTRAMUSCULAR | Status: AC
  Administered 2019-02-11: 1 mg via INTRAVENOUS
  Filled 2019-02-11: qty 2

## 2019-02-11 MED ORDER — PROPOFOL 1000 MG/100ML IV EMUL
INTRAVENOUS | Status: AC
  Filled 2019-02-11: qty 100

## 2019-02-11 MED ORDER — FENTANYL CITRATE (PF) 250 MCG/5ML IJ SOLN
INTRAMUSCULAR | Status: AC
  Filled 2019-02-11: qty 5

## 2019-02-11 MED ORDER — MEPERIDINE HCL 25 MG/ML IJ SOLN: 6.2500 mg | INTRAMUSCULAR | Status: DC | PRN

## 2019-02-11 MED ORDER — CHLORHEXIDINE GLUCONATE 4 % EX LIQD: 60.0000 mL | Freq: Once | CUTANEOUS | Status: DC

## 2019-02-11 MED ORDER — PROPOFOL 500 MG/50ML IV EMUL
INTRAVENOUS | Status: DC | PRN
  Administered 2019-02-11: 75 ug/kg/min via INTRAVENOUS

## 2019-02-11 MED ORDER — STERILE WATER FOR IRRIGATION IR SOLN
Status: DC | PRN
  Administered 2019-02-11: 1000 mL

## 2019-02-11 MED ORDER — PROPOFOL 10 MG/ML IV BOLUS
INTRAVENOUS | Status: DC | PRN
  Administered 2019-02-11: 50 mg via INTRAVENOUS

## 2019-02-11 MED ORDER — CLINDAMYCIN PHOSPHATE 900 MG/50ML IV SOLN
900.0000 mg | INTRAVENOUS | Status: DC
  Filled 2019-02-11: qty 50

## 2019-02-11 MED ORDER — HYDROMORPHONE HCL 1 MG/ML IJ SOLN: 0.2500 mg | INTRAMUSCULAR | Status: DC | PRN

## 2019-02-11 MED ORDER — ROPIVACAINE HCL 5 MG/ML IJ SOLN
INTRAMUSCULAR | Status: DC | PRN
  Administered 2019-02-11: 30 mL via PERINEURAL

## 2019-02-11 MED ORDER — LIDOCAINE 2% (20 MG/ML) 5 ML SYRINGE
INTRAMUSCULAR | Status: DC | PRN
  Administered 2019-02-11: 20 mg via INTRAVENOUS

## 2019-02-11 MED ORDER — LACTATED RINGERS IV SOLN: INTRAVENOUS | Status: DC

## 2019-02-11 MED ORDER — SODIUM CHLORIDE 0.9 % IV SOLN
INTRAVENOUS | Status: DC | PRN
  Administered 2019-02-11: 14:00:00 25 ug/min via INTRAVENOUS

## 2019-02-11 MED ORDER — ONDANSETRON HCL 4 MG/2ML IJ SOLN
INTRAMUSCULAR | Status: DC | PRN
  Administered 2019-02-11: 4 mg via INTRAVENOUS

## 2019-02-11 MED ORDER — ONDANSETRON HCL 4 MG/2ML IJ SOLN
INTRAMUSCULAR | Status: AC
  Filled 2019-02-11: qty 2

## 2019-02-11 MED ORDER — LACTATED RINGERS IV SOLN
INTRAVENOUS | Status: DC | PRN
  Administered 2019-02-11: 14:00:00 via INTRAVENOUS

## 2019-02-11 MED ORDER — PROPOFOL 10 MG/ML IV BOLUS
INTRAVENOUS | Status: AC
  Filled 2019-02-11: qty 20

## 2019-02-11 MED ORDER — HYDROCODONE-ACETAMINOPHEN 7.5-325 MG PO TABS: 1.0000 | ORAL_TABLET | Freq: Once | ORAL | Status: DC | PRN

## 2019-02-11 MED ORDER — BUPIVACAINE HCL (PF) 0.25 % IJ SOLN
INTRAMUSCULAR | Status: AC
  Filled 2019-02-11: qty 30

## 2019-02-11 MED ORDER — 0.9 % SODIUM CHLORIDE (POUR BTL) OPTIME
TOPICAL | Status: DC | PRN
  Administered 2019-02-11: 1000 mL

## 2019-02-11 MED ORDER — FENTANYL CITRATE (PF) 100 MCG/2ML IJ SOLN
INTRAMUSCULAR | Status: AC
  Administered 2019-02-11: 12:00:00 50 ug via INTRAVENOUS
  Filled 2019-02-11: qty 2

## 2019-02-11 MED ORDER — ACETAMINOPHEN 10 MG/ML IV SOLN: 1000.0000 mg | Freq: Once | INTRAVENOUS | Status: DC | PRN

## 2019-02-11 MED ORDER — CLONIDINE HCL (ANALGESIA) 100 MCG/ML EP SOLN
EPIDURAL | Status: DC | PRN
  Administered 2019-02-11: 100 ug

## 2019-02-11 SURGICAL SUPPLY — 46 items
BLADE MINI RND TIP GREEN BEAV (BLADE) IMPLANT
BNDG CMPR 9X4 STRL LF SNTH (GAUZE/BANDAGES/DRESSINGS) ×1
BNDG ELASTIC 3X5.8 VLCR STR LF (GAUZE/BANDAGES/DRESSINGS) ×3 IMPLANT
BNDG ELASTIC 4X5.8 VLCR STR LF (GAUZE/BANDAGES/DRESSINGS) ×3 IMPLANT
BNDG ESMARK 4X9 LF (GAUZE/BANDAGES/DRESSINGS) ×3 IMPLANT
BNDG GAUZE ELAST 4 BULKY (GAUZE/BANDAGES/DRESSINGS) ×3 IMPLANT
CLOSURE STERI-STRIP 1/2X4 (GAUZE/BANDAGES/DRESSINGS) ×1
CLOSURE WOUND 1/2 X4 (GAUZE/BANDAGES/DRESSINGS)
CLSR STERI-STRIP ANTIMIC 1/2X4 (GAUZE/BANDAGES/DRESSINGS) ×2 IMPLANT
CORD BIPOLAR FORCEPS 12FT (ELECTRODE) ×3 IMPLANT
COVER SURGICAL LIGHT HANDLE (MISCELLANEOUS) ×3 IMPLANT
COVER WAND RF STERILE (DRAPES) ×3 IMPLANT
CUFF TOURN SGL QUICK 18X4 (TOURNIQUET CUFF) IMPLANT
CUFF TOURN SGL QUICK 24 (TOURNIQUET CUFF)
CUFF TRNQT CYL 24X4X16.5-23 (TOURNIQUET CUFF) IMPLANT
DRAPE OEC MINIVIEW 54X84 (DRAPES) ×3 IMPLANT
DRAPE SURG 17X23 STRL (DRAPES) ×3 IMPLANT
DURAPREP 26ML APPLICATOR (WOUND CARE) ×3 IMPLANT
GAUZE SPONGE 4X4 12PLY STRL (GAUZE/BANDAGES/DRESSINGS) ×3 IMPLANT
GAUZE XEROFORM 1X8 LF (GAUZE/BANDAGES/DRESSINGS) ×3 IMPLANT
GLOVE SURG SYN 8.0 (GLOVE) ×3 IMPLANT
GOWN STRL REUS W/ TWL LRG LVL3 (GOWN DISPOSABLE) ×1 IMPLANT
GOWN STRL REUS W/ TWL XL LVL3 (GOWN DISPOSABLE) ×1 IMPLANT
GOWN STRL REUS W/TWL LRG LVL3 (GOWN DISPOSABLE) ×3
GOWN STRL REUS W/TWL XL LVL3 (GOWN DISPOSABLE) ×3
KIT BASIN OR (CUSTOM PROCEDURE TRAY) ×3 IMPLANT
KIT TURNOVER KIT B (KITS) ×3 IMPLANT
NEEDLE HYPO 25GX1X1/2 BEV (NEEDLE) ×3 IMPLANT
NS IRRIG 1000ML POUR BTL (IV SOLUTION) ×3 IMPLANT
PACK ORTHO EXTREMITY (CUSTOM PROCEDURE TRAY) ×3 IMPLANT
PAD ARMBOARD 7.5X6 YLW CONV (MISCELLANEOUS) ×3 IMPLANT
PAD CAST 4YDX4 CTTN HI CHSV (CAST SUPPLIES) ×1 IMPLANT
PADDING CAST COTTON 4X4 STRL (CAST SUPPLIES) ×3
STRIP CLOSURE SKIN 1/2X4 (GAUZE/BANDAGES/DRESSINGS) IMPLANT
SUT ETHILON 4 0 PS 2 18 (SUTURE) IMPLANT
SUT PROLENE 3 0 PS 2 (SUTURE) ×3 IMPLANT
SUT VIC AB 2-0 CT1 27 (SUTURE)
SUT VIC AB 2-0 CT1 TAPERPNT 27 (SUTURE) IMPLANT
SUT VIC AB 3-0 PS2 18 (SUTURE)
SUT VIC AB 3-0 PS2 18XBRD (SUTURE) IMPLANT
SUT VICRYL 4-0 PS2 18IN ABS (SUTURE) ×3 IMPLANT
SUT VICRYL RAPIDE 4/0 PS 2 (SUTURE) IMPLANT
TOWEL GREEN STERILE (TOWEL DISPOSABLE) ×3 IMPLANT
TOWEL GREEN STERILE FF (TOWEL DISPOSABLE) ×3 IMPLANT
UNDERPAD 30X30 (UNDERPADS AND DIAPERS) ×3 IMPLANT
WATER STERILE IRR 1000ML POUR (IV SOLUTION) IMPLANT

## 2019-02-11 NOTE — Transfer of Care (Signed)
Immediate Anesthesia Transfer of Care Note  Patient: Stephen Burch  Procedure(s) Performed: LEFT WRIST STENOSING TENOSYNOVITIS RELEASE (Left Wrist) LEFT WRIST RADIAL STYLOIDECTOMY (Left Wrist)  Patient Location: PACU  Anesthesia Type:MAC  Level of Consciousness: sedated  Airway & Oxygen Therapy: Patient Spontanous Breathing and Patient connected to face mask oxygen  Post-op Assessment: Report given to RN and Post -op Vital signs reviewed and stable  Post vital signs: Reviewed and stable  Last Vitals:  Vitals Value Taken Time  BP 88/60 02/11/19 1505  Temp    Pulse 61 02/11/19 1505  Resp 16 02/11/19 1505  SpO2 100 % 02/11/19 1505  Vitals shown include unvalidated device data.  Last Pain:  Vitals:   02/11/19 1059  TempSrc:   PainSc: 0-No pain      Patients Stated Pain Goal: 3 (01/00/71 2197)  Complications: No apparent anesthesia complications

## 2019-02-11 NOTE — Progress Notes (Signed)
Orthopedic Tech Progress Note Patient Details:  Stephen Burch Feb 24, 1956 [id number] PACU RN called requesting an arm sling for patient Ortho Devices Type of Ortho Device: Arm sling Ortho Device/Splint Location: ULE Ortho Device/Splint Interventions: Adjustment, Application, Ordered   Post Interventions Patient Tolerated: Well Instructions Provided: Care of device, Adjustment of device   Janit Pagan 02/11/2019, 3:39 PM

## 2019-02-11 NOTE — Anesthesia Procedure Notes (Signed)
Anesthesia Regional Block: Supraclavicular block   Pre-Anesthetic Checklist: ,, timeout performed, Correct Patient, Correct Site, Correct Laterality, Correct Procedure, Correct Position, site marked, Risks and benefits discussed,  Surgical consent,  Pre-op evaluation,  At surgeon's request and post-op pain management  Laterality: Left  Prep: chloraprep       Needles:  Injection technique: Single-shot  Needle Type: Echogenic Needle     Needle Length: 5cm  Needle Gauge: 21     Additional Needles:   Procedures:,,,, ultrasound used (permanent image in chart),,,,  Narrative:  Start time: 02/11/2019 11:34 AM End time: 02/11/2019 11:40 AM Injection made incrementally with aspirations every 5 mL.  Performed by: Personally  Anesthesiologist: Barnet Glasgow, MD

## 2019-02-11 NOTE — Anesthesia Postprocedure Evaluation (Signed)
Anesthesia Post Note  Patient: Stephen Burch  Procedure(s) Performed: LEFT WRIST STENOSING TENOSYNOVITIS RELEASE (Left Wrist) LEFT WRIST RADIAL STYLOIDECTOMY (Left Wrist)     Patient location during evaluation: PACU Anesthesia Type: MAC Level of consciousness: awake and alert Pain management: pain level controlled Vital Signs Assessment: post-procedure vital signs reviewed and stable Respiratory status: spontaneous breathing and respiratory function stable Cardiovascular status: stable Postop Assessment: no apparent nausea or vomiting Anesthetic complications: no    Last Vitals:  Vitals:   02/11/19 1509 02/11/19 1519  BP: 100/66 100/68  Pulse: 66 63  Resp: 18 (!) 21  Temp:    SpO2: 100% 99%    Last Pain:  Vitals:   02/11/19 1530  TempSrc:   PainSc: 0-No pain                 Kaylena Pacifico DANIEL

## 2019-02-11 NOTE — H&P (Signed)
Stephen Burch is an 63 y.o. male.   Chief Complaint: Left wrist pain and swelling HPI: Patient is a very pleasant 63 year old male status post wrist procedure in the past with new onset of left wrist first dorsal compartment tenosynovitis and radial styloid impingement  Past Medical History:  Diagnosis Date  . Arthritis    knees, wrists  . Barrett's esophagus   . Complication of anesthesia    02-22-2016 intraop cardiac arrest immediately following moving pt into prone position and vancomyocin administration , pt cardiac arrest w/ Vfib,  please refer to anesthesia record in epic  . Dysrhythmia    Afib 12/2018 - converted to NSR  . Esophageal varices determined by endoscopy ([place]) 11/2016   grade 1  . GERD (gastroesophageal reflux disease)   . Hiatal hernia   . History of adenomatous polyp of colon   . History of asthma    as child  . History of DVT (deep vein thrombosis)    right lower extremitty post-op right total knee surgery 09/ 2010,  treated w/ coumadin  . History of esophageal dilatation 07/1998   for schatzski ring  . History of kidney stones   . History of staph infection 04/2016   MSSA infection of right total knee w/ sepsis  . Hx of cardiac arrest    02-22-2016  intraoperative, immediately following moving pt into prone position and vancomyocin administration, cardiac arrest w/ Vfib (referred to anesthesia record in epic) pt extubated himself next day  . Liver cirrhosis secondary to NASH (nonalcoholic steatohepatitis) ([name])    NAFLD-- followed by dr Laural Golden---  liver bx 09-30-2012  mild portal and focal sinusoidal fibrosis  . Scapholunate advanced collapse of left wrist    deformity  . Thrombocytopenia ([place])    10-09-2017 per pt his platelets have always been low , followed by pcp, never been referred to hematologist    Past Surgical History:  Procedure Laterality Date  . APPLICATION OF WOUND VAC Right 02/22/2016   Procedure: APPLICATION OF WOUND VAC;  Surgeon: Vickey Huger, MD;  Location: [facility];  Service: Orthopedics;  Laterality: Right;  . CARDIAC CATHETERIZATION N/A 02/24/2016   Procedure: Left Heart Cath and Coronary Angiography;  Surgeon: Burnell Blanks, MD;  Location: [facility];  Service: Cardiovascular;  Laterality: N/A;  No angiographic evidence of CAD,  normal LVSF, ef 50-55%  . CARPECTOMY Left 10/17/2017   Procedure: LEFT WRIST PROXIMAL ROW CARPECTOMY WITH POSTEROR IMBROSSEOUS NERVE EXCISION;  Surgeon: Charlotte Crumb, MD;  Location: [facility];  Service: Orthopedics;  Laterality: Left;  AXILLARY BLOCK  . COLONOSCOPY    . EAR CYST EXCISION Right 09/06/2014   Procedure: OPEN EXCISION BAKER'S CYST RIGHT KNEE;  Surgeon: Vickey Huger, MD;  Location: [facility];  Service: Orthopedics;  Laterality: Right;  . ESOPHAGEAL DILATION  1996/ 2000  . ESOPHAGOGASTRODUODENOSCOPY N/A 09/17/2012   Procedure: ESOPHAGOGASTRODUODENOSCOPY (EGD);  Surgeon: Rogene Houston, MD;  [facility];  Service: Endoscopy;  Laterality: N/A;  200  . ESOPHAGOGASTRODUODENOSCOPY N/A 12/15/2015   Procedure: ESOPHAGOGASTRODUODENOSCOPY (EGD);  Surgeon: Rogene Houston, MD;  [facility];  Service: Endoscopy;  Laterality: N/A;  1245  . ESOPHAGOGASTRODUODENOSCOPY N/A 12/17/2016   Procedure: ESOPHAGOGASTRODUODENOSCOPY (EGD);  Surgeon: Rogene Houston, MD;  [facility];  Service: Endoscopy;  Laterality: N/A;  210  . I&D KNEE WITH POLY EXCHANGE Right 04/02/2016   Procedure: IRRIGATION AND DEBRIDEMENT RIGHT KNEE WITH POLY EXCHANGE;  Surgeon: Vickey Huger, MD;  Location: [facility];  Service: Orthopedics;  Laterality: Right;  . INCISION AND DRAINAGE HEMATOMA POST LEFT  TOTAL KNEE  2012  . INGUINAL HERNIA REPAIR Bilateral 05/22/2013   Procedure: REPAIR OF RECURRENT INCARCERATED INGUINAL HERNIA WITH MESH RIGHT SIDE,  REPAIR OF RECURRENT INGUINAL HERNIA WITH MESH LEFT SIDE;  Surgeon: [name], MD;  Location: [facility];  Service: General;   Laterality: Bilateral;  . INGUINAL HERNIA REPAIR Bilateral 11/1997  . INSERTION OF MESH Bilateral 05/22/2013   Procedure: INSERTION OF MESH;  Surgeon: [name], MD;  Location: [facility];  Service: General;  Laterality: Bilateral;  . IRRIGATION AND DEBRIDEMENT KNEE Right 02/22/2016   Procedure: IRRIGATION AND DEBRIDEMENT KNEE;  Surgeon: Vickey Huger, MD;  Location: [facility];  Service: Orthopedics;  Laterality: Right;  . Irrigation and Debridement right knee  03/23/2009   Dr. Sabra Heck, [facility]  . KNEE ARTHROSCOPY W/ SYNOVECTOMY Right 01/30/2010   AND DEBRIDEMENT OF HETEROTOPIC  . LIVER BIOPSY  09/30/2012  . REVISION TOTAL KNEE ARTHROPLASTY Left fall 2012  . TONSILLECTOMY  child  . TOTAL KNEE ARTHROPLASTY Bilateral right 09/ 2010/  left 09-18-2010  . TRANSTHORACIC ECHOCARDIOGRAM  02/22/2016   ef 30-35% (per cardiac cath 02-24-2016 normal), severe hypokinesis of the mid-apicalanteroseptal and apical myocardium,  grade 1 diastolic dysfunction/ trivial PR and TR  . WRIST ARTHROPLASTY Left 10/17/2017   Procedure: CAPITATE RESURFACING ARTHROPLASTY;  Surgeon: Charlotte Crumb, MD;  Location: [facility];  Service: Orthopedics;  Laterality: Left;    Family History  Problem Relation Age of Onset  . Arthritis Father        died age 37  . GI Bleed Father        upper GI Bleed, non-ETOH cirrhosis  . Arthritis Brother   . Diabetes Brother        type 2  . Diabetes Paternal Uncle        type 2  . Heart attack Maternal Grandmother   . Heart attack Maternal Grandfather   . Colon cancer Neg Hx   . Colon polyps Neg Hx    Social History:  reports that he quit smoking about 44 years ago. His smoking use included cigarettes. He quit after 3.00 years of use. He has never used smokeless tobacco. He reports previous alcohol use. He reports that he does not use drugs.  Allergies:  Allergies  Allergen Reactions  . Asa [Aspirin] Shortness Of Breath    "asthma symptoms"  . Penicillins Shortness  Of Breath    Has patient had a PCN reaction causing immediate rash, facial/tongue/throat swelling, SOB or lightheadedness with hypotension: Yes Has patient had a PCN reaction causing severe rash involving mucus membranes or skin necrosis: No Has patient had a PCN reaction that required hospitalization No Has patient had a PCN reaction occurring within the last 10 years: No If all of the above answers are "NO", then may proceed with Cephalosporin use. "asthma symptoms" Patient has tolerated cefazolin, ceftriaxo  . Vancomycin Anaphylaxis    Immediately following being turned into prone position and Vancomycin administration in the OR patient cardiac arrest w/  Vfib.    Medications Prior to Admission  Medication Sig Dispense Refill  . metoprolol tartrate (LOPRESSOR) 25 MG tablet Take 0.5 tablets (12.5 mg total) by mouth 2 (two) times daily. 60 tablet 0    Results for orders placed or performed during the hospital encounter of 02/11/19 (from the past 48 hour(s))  Comprehensive metabolic panel     Status: Abnormal  Collection Time: 02/11/19 10:35 AM  Result Value Ref Range   Sodium 139 135 - 145 mmol/L   Potassium 4.1 3.5 - 5.1 mmol/L   Chloride 108 98 - 111 mmol/L   CO2 23 22 - 32 mmol/L   Glucose, Bld 139 (H) 70 - 99 mg/dL   BUN 11 8 - 23 mg/dL   Creatinine, Ser 0.79 0.61 - 1.24 mg/dL   Calcium 8.6 (L) 8.9 - 10.3 mg/dL   Total Protein 6.4 (L) 6.5 - 8.1 g/dL   Albumin 3.8 3.5 - 5.0 g/dL   AST 27 15 - 41 U/L   ALT 24 0 - 44 U/L   Alkaline Phosphatase 142 (H) 38 - 126 U/L   Total Bilirubin 1.7 (H) 0.3 - 1.2 mg/dL   GFR calc non Af Amer >60 >60 mL/min   GFR calc Af Amer >60 >60 mL/min   Anion gap 8 5 - 15    Comment: Performed at [facility], [address] [place], [place] [address]  CBC     Status: Abnormal   Collection Time: 02/11/19 10:35 AM  Result Value Ref Range   WBC 2.4 (L) 4.0 - 10.5 K/uL   RBC 5.08 4.22 - 5.81 MIL/uL   Hemoglobin 14.8 13.0 - 17.0 g/dL   HCT  43.6 39.0 - 52.0 %   MCV 85.8 80.0 - 100.0 fL   MCH 29.1 26.0 - 34.0 pg   MCHC 33.9 30.0 - 36.0 g/dL   RDW 13.5 11.5 - 15.5 %   Platelets  150 - 400 K/uL    PLATELET CLUMPS NOTED ON SMEAR, COUNT APPEARS DECREASED    Comment: PLATELET CLUMPING, SUGGEST RECOLLECTION OF SAMPLE IN CITRATE TUBE. Immature Platelet Fraction may be clinically indicated, consider ordering this additional test [id number]    nRBC 0.0 0.0 - 0.2 %    Comment: Performed at [facility], [place] [address], [place], [place] [address]   No results found.  Review of Systems  All other systems reviewed and are negative.   Blood pressure 111/60, pulse 64, temperature 98.2 F (36.8 C), temperature source Oral, resp. rate 13, height 6' (1.829 m), weight 89.8 kg, SpO2 98 %. Physical Exam  Constitutional: He is oriented to person, place, and time. He appears well-developed and well-nourished.  HENT:  Head: Normocephalic and atraumatic.  Neck: Normal range of motion.  Cardiovascular: Normal rate.  Respiratory: Effort normal.  Musculoskeletal:     Left wrist: He exhibits tenderness, bony tenderness and swelling.     Comments: Left wrist first dorsal compartment pain and swelling  Neurological: He is alert and oriented to person, place, and time.  Skin: Skin is warm.  Psychiatric: He has a normal mood and affect. His behavior is normal. Judgment and thought content normal.     Assessment/Plan 63 year old male with left wrist first dorsal compartment tenosynovitis and radial styloid impingement.  Have discussed the role of first dorsal compartment release and styloidectomy as an outpatient.  Patient understands the risks and benefits and wishes to proceed. Schuyler Amor, MD 02/11/2019, 1:25 PM

## 2019-02-11 NOTE — Anesthesia Preprocedure Evaluation (Signed)
Anesthesia Evaluation  Patient identified by MRN, date of birth, ID band Patient awake    Reviewed: Allergy & Precautions, H&P , NPO status , Patient's Chart, lab work & pertinent test results  Airway Mallampati: II  TM Distance: >3 FB Neck ROM: Full    Dental no notable dental hx. (+) Teeth Intact   Pulmonary asthma , former smoker,    Pulmonary exam normal breath sounds clear to auscultation       Cardiovascular Exercise Tolerance: Good Normal cardiovascular exam+ dysrhythmias Atrial Fibrillation  Rhythm:Regular Rate:Normal  12/21/18 Echo  1. The left ventricle has normal systolic function with an ejection fraction of 60-65%. The cavity size was normal. Left ventricular diastolic parameters were normal. No evidence of left ventricular regional wall motion abnormalities.   Neuro/Psych negative neurological ROS  negative psych ROS   GI/Hepatic Neg liver ROS, hiatal hernia, GERD  ,  Endo/Other    Renal/GU negative Renal ROS     Musculoskeletal  (+) Arthritis ,   Abdominal   Peds  Hematology   Anesthesia Other Findings   Reproductive/Obstetrics                             Anesthesia Physical Anesthesia Plan  ASA: III  Anesthesia Plan: MAC   Post-op Pain Management:  Regional for Post-op pain   Induction: Intravenous  PONV Risk Score and Plan: 3 and Treatment may vary due to age or medical condition, Ondansetron and Dexamethasone  Airway Management Planned: Simple Face Mask and Natural Airway  Additional Equipment:   Intra-op Plan:   Post-operative Plan:   Informed Consent: I have reviewed the patients History and Physical, chart, labs and discussed the procedure including the risks, benefits and alternatives for the proposed anesthesia with the patient or authorized representative who has indicated his/her understanding and acceptance.     Dental advisory given  Plan  Discussed with:   Anesthesia Plan Comments:         Anesthesia Quick Evaluation

## 2019-02-11 NOTE — Brief Op Note (Signed)
02/11/2019  2:56 PM  PATIENT:  Stephen Burch  63 y.o. male  PRE-OPERATIVE DIAGNOSIS:  LEFT WRIST STENOSING TENOSYNOVITIS, LEFT WRIST PAIN M19.132 M25.532  POST-OPERATIVE DIAGNOSIS:  LEFT WRIST STENOSING TENOSYNOVITIS, LEFT WRIST PAIN M19.132 M25.532  PROCEDURE:  Procedure(s) with comments: LEFT WRIST STENOSING TENOSYNOVITIS RELEASE (Left) - MAC WITH AXILLARY BLOCK LEFT WRIST RADIAL STYLOIDECTOMY (Left)  SURGEON:  Surgeon(s) and Role:    Charlotte Crumb, MD - Primary  PHYSICIAN ASSISTANT:   ASSISTANTS: none   ANESTHESIA:   regional  EBL:  none  BLOOD ADMINISTERED:none  DRAINS: none   LOCAL MEDICATIONS USED:  NONE  SPECIMEN:  No Specimen  DISPOSITION OF SPECIMEN:  N/A  COUNTS:  YES  TOURNIQUET:  * Missing tourniquet times found for documented tourniquets in log: [id number] *  DICTATION: .Dragon Dictation  PLAN OF CARE: Discharge to home after PACU  PATIENT DISPOSITION:  PACU - hemodynamically stable.   Delay start of Pharmacological VTE agent (>24hrs) due to surgical blood loss or risk of bleeding: not applicable

## 2019-02-11 NOTE — Op Note (Signed)
Patient was taken to the operating suite and after the induction of adequate regional anesthetic and IV sedation the left upper extremity was prepped and draped in the usual sterile fashion.  An Esmarch was used to exsanguinate the limb and the tourniquet was inflated to 250 mmHg.  At this point time incision was made over the dorsal radial aspect of the left wrist dissection was carried down to the first dorsal compartment.  The first dorsal compartment was released at its dorsal most extent.  There is a separate EPB sheath.  We then performed a subperiosteal dissection of the radial styloid area under direct and fluoroscopic imaging and radial styloidectomy was performed with an osteotome and mallet.  We used curettes and a rondure to smooth out the articular surface.  Fluoroscopic imaging in multiple view showed no impingement with radial and ulnar deviation.  The previously placed implant was in good position and stable.  The wound was thoroughly irrigated loosely closed in layers of 4-0 Vicryl to close the capsule and a 3-0 Prolene subcuticular stitch on the skin.  Steri-Strips, 4 x 4's, and a radial gutter splint was applied.  The patient tolerated this procedure well went to recovery in stable fashion.

## 2019-02-12 ENCOUNTER — Encounter (HOSPITAL_COMMUNITY): Payer: Self-pay | Admitting: Orthopedic Surgery

## 2019-02-16 DIAGNOSIS — M19132 Post-traumatic osteoarthritis, left wrist: Secondary | ICD-10-CM | POA: Diagnosis not present

## 2019-02-16 DIAGNOSIS — M25532 Pain in left wrist: Secondary | ICD-10-CM | POA: Diagnosis not present

## 2019-02-16 DIAGNOSIS — G8929 Other chronic pain: Secondary | ICD-10-CM | POA: Diagnosis not present

## 2019-02-23 DIAGNOSIS — L80 Vitiligo: Secondary | ICD-10-CM | POA: Diagnosis not present

## 2019-02-27 ENCOUNTER — Telehealth: Payer: Self-pay

## 2019-02-27 NOTE — Telephone Encounter (Signed)
Stephen Burch wanted to know if you received the message regarding Mr. Cuoco's TSH level from June.

## 2019-03-03 ENCOUNTER — Other Ambulatory Visit: Payer: Self-pay

## 2019-03-03 ENCOUNTER — Ambulatory Visit (INDEPENDENT_AMBULATORY_CARE_PROVIDER_SITE_OTHER): Payer: PPO | Admitting: Physician Assistant

## 2019-03-03 ENCOUNTER — Encounter: Payer: Self-pay | Admitting: Physician Assistant

## 2019-03-03 VITALS — BP 142/77 | HR 73 | Temp 97.3°F | Wt 205.4 lb

## 2019-03-03 DIAGNOSIS — L237 Allergic contact dermatitis due to plants, except food: Secondary | ICD-10-CM

## 2019-03-03 MED ORDER — METHYLPREDNISOLONE ACETATE 40 MG/ML IJ SUSP
80.0000 mg | Freq: Once | INTRAMUSCULAR | Status: AC
  Administered 2019-03-03: 80 mg via INTRAMUSCULAR

## 2019-03-03 NOTE — Patient Instructions (Signed)
Poison Oak Dermatitis  Poison oak dermatitis is redness and soreness (inflammation) of the skin caused by chemicals in the leaves of the poison oak plant. You may have very bad itching, swelling, a rash, and blisters. What are the causes? You may get this condition by:  Touching a poison oak plant.  Touching something that has the chemical from the leaves on it. This may include animals or objects that have come in contact with the plant. What increases the risk? You are more likely to get this condition if you:  Go outdoors often in wooded or marshy areas.  Go outdoors without wearing protective clothing, such as closed shoes, long pants, and a long-sleeved shirt. What are the signs or symptoms? Symptoms of this condition include:  Redness of the skin.  Very bad itching.  A rash that often includes bumps and blisters. ? The rash usually appears 48 hours after exposure if you have been exposed before. ? If this is the first time you have been exposed, the rash may not appear until a week after exposure.  Swelling. This may occur if the reaction is very bad. Symptoms often clear up in 1-2 weeks. The first time you develop this condition, symptoms may last 3-4 weeks. How is this treated? This condition may be treated with:  Hydrocortisone creams or calamine lotions to help with itching.  Oatmeal baths to soothe the skin.  Medicines to help reduce itching (antihistamines). If you have a very bad reaction, you may also be given steroid medicines. Follow these instructions at home: Medicines  Take or apply over-the-counter and prescription medicines only as told by your doctor.  Use hydrocortisone creams or calamine lotion as needed to help with itching. General instructions  Do not scratch or rub your skin.  Put a cold, wet cloth (cold compress) on the affected areas or take baths in cool water. This will help with itching.  Avoid hot baths and showers.  Take oatmeal  baths as needed. Use colloidal oatmeal. You can get this at a pharmacy or grocery store. Follow the instructions on the package.  While you have the rash, wash your clothes right after you wear them.  Keep all follow-up visits as told by your doctor. This is important. How is this prevented?   Know what poison oak looks like so you can avoid it. ? This plant has three leaves with flowering branches on a single stem. ? The leaves are fuzzy. ? The edges of the leaves look like teeth.  If you have touched poison oak, wash your skin with soap and water right away. Be sure to wash under your fingernails.  When hiking or camping, wear long pants, a long-sleeved shirt, tall socks, and hiking boots. You can also use a lotion on your skin that helps to prevent contact with the chemical on the plant.  If you think that your clothes or outdoor gear came in contact with poison oak, rinse them off with a garden hose before you bring them inside your house.  When doing yard work or gardening, wear gloves, long sleeves, long pants, and boots. Wash your garden tools and gloves if they come in contact with poison oak.  If you think that your pet has come into contact with poison oak, wash him or her with pet shampoo and water. Make sure you wear gloves while washing your pet.  Do not burn poison oak plants. This can release the chemical from the plant into the air and  may cause a reaction. Contact a doctor if:  You have open sores in the rash area.  You have more redness, swelling, or pain in the affected area.  You have redness that spreads beyond the rash area.  You have fluid, blood, or pus coming from the affected area.  You have a fever.  You have a rash over a large area of your body.  You have a rash on your eyes, mouth, or genitals.  Your rash does not improve after a few weeks. Get help right away if:  Your face swells or your eyes swell shut.  You have trouble breathing.  You  have trouble swallowing. These symptoms may be an emergency. Do not wait to see if the symptoms will go away. Get medical help right away. Call your local emergency services (911 in the [place] Do not drive yourself to the hospital. Summary  Poison oak dermatitis is redness and soreness of the skin caused by chemicals in the leaves of the poison oak plant.  Symptoms of this condition include redness, very bad itching, a rash, and swelling.  Do not scratch or rub your skin.  Take or apply over-the-counter and prescription medicines only as told by your doctor. This information is not intended to replace advice given to you by your health care provider. Make sure you discuss any questions you have with your health care provider. Document Released: 07/21/2010 Document Revised: 10/10/2018 Document Reviewed: 07/18/2018 Elsevier Patient Education  2020 [employer].

## 2019-03-03 NOTE — Progress Notes (Signed)
Patient: Stephen Burch Male    DOB: 1955-07-19   63 y.o.   MRN: [id number] Visit Date: 03/03/2019  Today's Provider: Margaretann Loveless, PA-C   Chief Complaint  Patient presents with  . Rash   Subjective:     Rash The current episode started in the past 7 days. The problem has been gradually worsening since onset. The affected locations include the face, neck, left arm, left elbow, left wrist, right elbow, right arm and right wrist. The rash is characterized by dryness and itchiness. He was exposed to plant contact. Pertinent negatives include no congestion, cough, fever or shortness of breath. Past treatments include anti-itch cream. The treatment provided no relief.      Allergies  Allergen Reactions  . Asa [Aspirin] Shortness Of Breath    "asthma symptoms"  . Penicillins Shortness Of Breath    Has patient had a PCN reaction causing immediate rash, facial/tongue/throat swelling, SOB or lightheadedness with hypotension: Yes Has patient had a PCN reaction causing severe rash involving mucus membranes or skin necrosis: No Has patient had a PCN reaction that required hospitalization No Has patient had a PCN reaction occurring within the last 10 years: No If all of the above answers are "NO", then may proceed with Cephalosporin use. "asthma symptoms" Patient has tolerated cefazolin, ceftriaxo  . Vancomycin Anaphylaxis    Immediately following being turned into prone position and Vancomycin administration in the OR patient cardiac arrest w/  Vfib.     Current Outpatient Medications:  .  metoprolol tartrate (LOPRESSOR) 25 MG tablet, Take 0.5 tablets (12.5 mg total) by mouth 2 (two) times daily., Disp: 60 tablet, Rfl: 0 .  oxyCODONE-acetaminophen (PERCOCET) 5-325 MG tablet, Take 1 tablet by mouth every 4 (four) hours as needed for severe pain., Disp: 20 tablet, Rfl: 0  Review of Systems  Constitutional: Negative.  Negative for fever.  HENT: Negative for congestion.    Respiratory: Negative.  Negative for cough and shortness of breath.   Cardiovascular: Negative.   Skin: Positive for rash.  Hematological: Negative.     Social History   Tobacco Use  . Smoking status: Former Smoker    Years: 3.00    Types: Cigarettes    Quit date: 10/10/1974    Years since quitting: 44.4  . Smokeless tobacco: Never Used  Substance Use Topics  . Alcohol use: Not Currently      Objective:   BP (!) 142/77 (BP Location: Left Arm, Patient Position: Sitting, Cuff Size: Normal)   Pulse 73   Temp (!) 97.3 F (36.3 C) (Temporal)   Wt 205 lb 6.4 oz (93.2 kg)   BMI 27.86 kg/m  Vitals:   03/03/19 1340  BP: (!) 142/77  Pulse: 73  Temp: (!) 97.3 F (36.3 C)  TempSrc: Temporal  Weight: 205 lb 6.4 oz (93.2 kg)  Body mass index is 27.86 kg/m.   Physical Exam Vitals signs reviewed.  Constitutional:      General: He is not in acute distress.    Appearance: Normal appearance. He is well-developed. He is not diaphoretic.  HENT:     Head: Normocephalic and atraumatic.  Neck:     Musculoskeletal: Normal range of motion and neck supple.     Thyroid: No thyromegaly.     Vascular: No JVD.     Trachea: No tracheal deviation.  Cardiovascular:     Rate and Rhythm: Normal rate and regular rhythm.     Heart sounds: Normal  heart sounds. No murmur. No friction rub. No gallop.   Pulmonary:     Effort: Pulmonary effort is normal. No respiratory distress.     Breath sounds: Normal breath sounds. No wheezing or rales.  Lymphadenopathy:     Cervical: No cervical adenopathy.  Skin:    Findings: Rash present. Rash is papular.       Neurological:     Mental Status: He is alert.      No results found for any visits on 03/03/19.     Assessment & Plan    1. Allergic dermatitis due to poison oak Methylprednisolone IM given as below. Continue benadryl or calamine lotion. May use topical hydrocortisone as well. Call if not improving.  - methylPREDNISolone acetate  (DEPO-MEDROL) injection 80 mg     Margaretann Loveless, PA-C  [facility]

## 2019-04-23 ENCOUNTER — Encounter: Payer: Self-pay | Admitting: Family Medicine

## 2019-04-23 ENCOUNTER — Other Ambulatory Visit: Payer: Self-pay

## 2019-04-23 ENCOUNTER — Ambulatory Visit (INDEPENDENT_AMBULATORY_CARE_PROVIDER_SITE_OTHER): Payer: PPO | Admitting: Family Medicine

## 2019-04-23 VITALS — BP 152/70 | HR 71 | Temp 97.3°F | Wt 206.0 lb

## 2019-04-23 DIAGNOSIS — Z87442 Personal history of urinary calculi: Secondary | ICD-10-CM | POA: Diagnosis not present

## 2019-04-23 DIAGNOSIS — R109 Unspecified abdominal pain: Secondary | ICD-10-CM

## 2019-04-23 NOTE — Progress Notes (Signed)
Stephen Burch  MRN: [id number] DOB: [birth year]  Subjective:  HPI   The patient is a 63 year old male who presents for evaluation of possible kidney stone.  He reports that he started having left mid back pain yesterday morning. He states that the pain has progressed and feels it has moved down his back to where he is now feeling the pain lower and closer to the pelvis.   He reports that he has had history of kidney stone in the past but it was not as bad as this pain.  He reports he does not remember exactly when but says it was more than at least 10 years ago.   He denies any hematuria or difficulty with urinating.    Patient Active Problem List   Diagnosis Date Noted  . Other pancytopenia (HCC) 12/31/2018  . Other neutropenia (HCC)   . History of atrial fibrillation 12/20/2018  . Abnormal liver function 12/20/2018  . NASH (nonalcoholic steatohepatitis) 12/20/2018  . Acute systolic (congestive) heart failure (HCC) 04/30/2018  . Scapholunate advanced collapse of left wrist 08/27/2017  . NAFLD (nonalcoholic fatty liver disease) 24/40/1027  . Unsteadiness on feet   . Staphylococcus aureus bacteremia with sepsis (HCC)   . Infection of prosthetic right knee joint (HCC)   . Knee pain, acute   . Pyogenic arthritis of right knee joint (HCC) 04/02/2016  . Thrombocytopenia (HCC)   . Hypomagnesemia   . Cardiac arrest (HCC) 02/22/2016  . Encounter for central line placement   . Shock circulatory (HCC)   . Vitiligo 08/22/2015  . AD (atopic dermatitis) 08/22/2015  . Osteoarthritis of right knee 08/22/2015  . Cirrhosis of liver without ascites (HCC) 08/22/2015  . Hyperglycemia 08/22/2015  . GERD (gastroesophageal reflux disease) 08/22/2015  . Ganglion of left wrist 08/22/2015  . ED (erectile dysfunction) of organic origin 08/22/2015  . Asthma 08/22/2015  . Dysplastic nodule of liver 08/22/2015  . Baker cyst 09/06/2014  . H/O total knee replacement 08/05/2014  . Bilateral inguinal  hernia 08/01/2012  . Splenomegaly 08/01/2012  . Portal hypertension (HCC) 08/01/2012  . Gallstones 08/01/2012    Past Medical History:  Diagnosis Date  . Arthritis    knees, wrists  . Barrett's esophagus   . Complication of anesthesia    02-22-2016 intraop cardiac arrest immediately following moving pt into prone position and vancomyocin administration , pt cardiac arrest w/ Vfib,  please refer to anesthesia record in epic  . Dysrhythmia    Afib 12/2018 - converted to NSR  . Esophageal varices determined by endoscopy (HCC) 11/2016   grade 1  . GERD (gastroesophageal reflux disease)   . Hiatal hernia   . History of adenomatous polyp of colon   . History of asthma    as child  . History of DVT (deep vein thrombosis)    right lower extremitty post-op right total knee surgery 09/ 2010,  treated w/ coumadin  . History of esophageal dilatation 07/1998   for schatzski ring  . History of kidney stones   . History of staph infection 04/2016   MSSA infection of right total knee w/ sepsis  . Hx of cardiac arrest    02-22-2016  intraoperative, immediately following moving pt into prone position and vancomyocin administration, cardiac arrest w/ Vfib (referred to anesthesia record in epic) pt extubated himself next day  . Liver cirrhosis secondary to NASH (nonalcoholic steatohepatitis) (HCC)    NAFLD-- followed by dr Karilyn Cota---  liver bx 09-30-2012  mild portal  and focal sinusoidal fibrosis  . Scapholunate advanced collapse of left wrist    deformity  . Thrombocytopenia (HCC)    10-09-2017 per pt his platelets have always been low , followed by pcp, never been referred to hematologist   Past Surgical History:  Procedure Laterality Date  . APPLICATION OF WOUND VAC Right 02/22/2016   Procedure: APPLICATION OF WOUND VAC;  Surgeon: Dannielle Huh, MD;  Location: [facility] OR;  Service: Orthopedics;  Laterality: Right;  . CARDIAC CATHETERIZATION N/A 02/24/2016   Procedure: Left Heart Cath and Coronary  Angiography;  Surgeon: Kathleene Hazel, MD;  Location: [facility] INVASIVE CV LAB;  Service: Cardiovascular;  Laterality: N/A;  No angiographic evidence of CAD,  normal LVSF, ef 50-55%  . CARPECTOMY Left 10/17/2017   Procedure: LEFT WRIST PROXIMAL ROW CARPECTOMY WITH POSTEROR IMBROSSEOUS NERVE EXCISION;  Surgeon: Dairl Ponder, MD;  Location: [facility] ;  Service: Orthopedics;  Laterality: Left;  AXILLARY BLOCK  . CARPECTOMY WITH RADIAL STYLOIDECTOMY Left 02/11/2019   Procedure: LEFT WRIST RADIAL STYLOIDECTOMY;  Surgeon: Dairl Ponder, MD;  Location: [facility] OR;  Service: Orthopedics;  Laterality: Left;  . COLONOSCOPY    . EAR CYST EXCISION Right 09/06/2014   Procedure: OPEN EXCISION BAKER'S CYST RIGHT KNEE;  Surgeon: Dannielle Huh, MD;  Location: [facility];  Service: Orthopedics;  Laterality: Right;  . ESOPHAGEAL DILATION  1996/ 2000  . ESOPHAGOGASTRODUODENOSCOPY N/A 09/17/2012   Procedure: ESOPHAGOGASTRODUODENOSCOPY (EGD);  Surgeon: Malissa Hippo, MD;  Location: [facility];  Service: Endoscopy;  Laterality: N/A;  200  . ESOPHAGOGASTRODUODENOSCOPY N/A 12/15/2015   Procedure: ESOPHAGOGASTRODUODENOSCOPY (EGD);  Surgeon: Malissa Hippo, MD;  [facility]: [facility];  Service: Endoscopy;  Laterality: N/A;  1245  . ESOPHAGOGASTRODUODENOSCOPY N/A 12/17/2016   Procedure: ESOPHAGOGASTRODUODENOSCOPY (EGD);  Surgeon: Malissa Hippo, MD;  [facility]: [facility];  Service: Endoscopy;  Laterality: N/A;  210  . I&D KNEE WITH POLY EXCHANGE Right 04/02/2016   Procedure: IRRIGATION AND DEBRIDEMENT RIGHT KNEE WITH POLY EXCHANGE;  Surgeon: Dannielle Huh, MD;  Location: [facility];  Service: Orthopedics;  Laterality: Right;  . INCISION AND DRAINAGE HEMATOMA POST LEFT  TOTAL KNEE  2012  . INGUINAL HERNIA REPAIR Bilateral 05/22/2013   Procedure: REPAIR OF RECURRENT INCARCERATED INGUINAL HERNIA WITH MESH RIGHT SIDE,  REPAIR OF RECURRENT INGUINAL HERNIA WITH MESH LEFT SIDE;  Surgeon: Ernestene Mention, MD;   Location: [facility] OR;  Service: General;  Laterality: Bilateral;  . INGUINAL HERNIA REPAIR Bilateral 11/1997  . INSERTION OF MESH Bilateral 05/22/2013   Procedure: INSERTION OF MESH;  Surgeon: Ernestene Mention, MD;  Location[facility] OR;  Service: General;  Laterality: Bilateral;  . IRRIGATION AND DEBRIDEMENT KNEE Right 02/22/2016   Procedure: IRRIGATION AND DEBRIDEMENT KNEE;  Surgeon: Dannielle Huh, MD;  Location: [facility] OR;  Service: Orthopedics;  Laterality: Right;  . Irrigation and Debridement right knee  03/23/2009   Dr. Hyacinth Meeker, [facility] [place]  . KNEE ARTHROSCOPY W/ SYNOVECTOMY Right 01/30/2010   AND DEBRIDEMENT OF HETEROTOPIC  . LIVER BIOPSY  09/30/2012  . REVISION TOTAL KNEE ARTHROPLASTY Left fall 2012  . TONSILLECTOMY  child  . TOTAL KNEE ARTHROPLASTY Bilateral right 09/ 2010/  left 09-18-2010  . TRANSTHORACIC ECHOCARDIOGRAM  02/22/2016   ef 30-35% (per cardiac cath 02-24-2016 normal), severe hypokinesis of the mid-apicalanteroseptal and apical myocardium,  grade 1 diastolic dysfunction/ trivial PR and TR  . TRIGGER FINGER RELEASE Left 02/11/2019   Procedure: LEFT WRIST STENOSING TENOSYNOVITIS RELEASE;  Surgeon: Dairl Ponder, MD;  Location: [facility] OR;  Service: Orthopedics;  Laterality: Left;  MAC WITH AXILLARY BLOCK  . WRIST ARTHROPLASTY Left 10/17/2017   Procedure: CAPITATE RESURFACING ARTHROPLASTY;  Surgeon: Dairl Ponder, MD;  Location: [facility];  Service: Orthopedics;  Laterality: Left;   Family History  Problem Relation Age of Onset  . Arthritis Father        died age 84  . GI Bleed Father        upper GI Bleed, non-ETOH cirrhosis  . Arthritis Brother   . Diabetes Brother        type 2  . Diabetes Paternal Uncle        type 2  . Heart attack Maternal Grandmother   . Heart attack Maternal Grandfather   . Colon cancer Neg Hx   . Colon polyps Neg Hx    Social History   Socioeconomic History  . Marital status: Legally Separated    Spouse name: Not on file  . Number of  children: 2  . Years of education: Not on file  . Highest education level: 12th grade  Occupational History  . Occupation: Actuary: [employer]    Comment: part time  Social Needs  . Financial resource strain: Not hard at all  . Food insecurity    Worry: Never true    Inability: Never true  . Transportation needs    Medical: No    Non-medical: No  Tobacco Use  . Smoking status: Former Smoker    Years: 3.00    Types: Cigarettes    Quit date: 10/10/1974    Years since quitting: 44.5  . Smokeless tobacco: Never Used  Substance and Sexual Activity  . Alcohol use: Not Currently  . Drug use: No  . Sexual activity: Not on file  Lifestyle  . Physical activity    Days per week: Not on file    Minutes per session: Not on file  . Stress: Not at all  Relationships  . Social Musician on phone: Not on file    Gets together: Not on file    Attends religious service: Not on file    Active member of club or organization: Not on file    Attends meetings of clubs or organizations: Not on file    Relationship status: Not on file  . Intimate partner violence    Fear of current or ex partner: Not on file    Emotionally abused: Not on file    Physically abused: Not on file    Forced sexual activity: Not on file  Other Topics Concern  . Not on file  Social History Narrative   ** Merged History Encounter **       Pt lives alone.   Outpatient Encounter Medications as of 04/23/2019  Medication Sig  . metoprolol tartrate (LOPRESSOR) 25 MG tablet Take 0.5 tablets (12.5 mg total) by mouth 2 (two) times daily. (Patient not taking: Reported on 04/23/2019)  . oxyCODONE-acetaminophen (PERCOCET) 5-325 MG tablet Take 1 tablet by mouth every 4 (four) hours as needed for severe pain. (Patient not taking: Reported on 04/23/2019)   No facility-administered encounter medications on file as of 04/23/2019.    Allergies  Allergen Reactions  . Asa [Aspirin] Shortness Of  Breath    "asthma symptoms"  . Penicillins Shortness Of Breath    Has patient had a PCN reaction causing immediate rash, facial/tongue/throat swelling, SOB or lightheadedness with hypotension: Yes Has patient had a PCN reaction causing  severe rash involving mucus membranes or skin necrosis: No Has patient had a PCN reaction that required hospitalization No Has patient had a PCN reaction occurring within the last 10 years: No If all of the above answers are "NO", then may proceed with Cephalosporin use. "asthma symptoms" Patient has tolerated cefazolin, ceftriaxo  . Vancomycin Anaphylaxis    Immediately following being turned into prone position and Vancomycin administration in the OR patient cardiac arrest w/  Vfib.    Review of Systems  Constitutional: Negative for chills, diaphoresis, fever and malaise/fatigue.  HENT: Negative for congestion, ear pain, sinus pain and sore throat.   Respiratory: Negative for cough and shortness of breath.   Cardiovascular: Negative for chest pain.  Gastrointestinal: Negative for abdominal pain, constipation, diarrhea, nausea and vomiting.  Genitourinary: Positive for flank pain. Negative for dysuria, frequency and hematuria.    Objective:  BP (!) 152/70 (BP Location: Right Arm, Patient Position: Sitting, Cuff Size: Normal)   Pulse 71   Temp (!) 97.3 F (36.3 C) (Skin)   Wt 206 lb (93.4 kg)   SpO2 97%   BMI 27.94 kg/m   BP Readings from Last 3 Encounters:  04/23/19 (!) 152/70  03/03/19 (!) 142/77  02/11/19 100/68   Physical Exam  Constitutional: He is oriented to person, place, and time and well-developed, well-nourished, and in no distress.  HENT:  Head: Normocephalic.  Eyes: Conjunctivae are normal.  Neck: Neck supple.  Cardiovascular: Normal rate and regular rhythm.  Pulmonary/Chest: Effort normal and breath sounds normal.  Abdominal: Soft. Bowel sounds are normal. He exhibits no distension and no mass. There is no abdominal tenderness.  There is no rebound and no guarding.  No posterior CVA tenderness to percussion.   Musculoskeletal: Normal range of motion.  Neurological: He is alert and oriented to person, place, and time.  Skin: No rash noted.  Psychiatric: Mood, affect and judgment normal.    Assessment and Plan :   1. Left flank pain Onset yesterday without fever, hematuria, dysuria or frequency. Started as a sharp pain in the left upper back and flank that moved more laterally. Has abated this morning. No association with activities or eating. States he has been eating more ice cream and Instant Breakfast with Milk. Unable to collect urine specimen today. Given urine strainer and cup to collect any stones for analysis. Stated he had pain medications at home and advised to drink more water to help flush out stones. Declined offer to get diagnostic imaging unless pain returned and persisted.  2. History of renal stone States he had a kidney stone approximately 10 years ago that was a pain similar to this. Had a CT scan of abdomen/pelvis on 12-20-18 to evaluate splenomegaly and no renal stones were noted. Recheck if pain recurs and persists.

## 2019-04-27 ENCOUNTER — Ambulatory Visit
Admission: RE | Admit: 2019-04-27 | Discharge: 2019-04-27 | Disposition: A | Discharge status: Home / Self Care | Payer: PPO | Attending: Family Medicine | Admitting: Family Medicine

## 2019-04-27 ENCOUNTER — Ambulatory Visit
Admission: RE | Admit: 2019-04-27 | Discharge: 2019-04-27 | Disposition: A | Discharge status: Home / Self Care | Payer: PPO | Source: Ambulatory Visit | Attending: Family Medicine | Admitting: Family Medicine

## 2019-04-27 ENCOUNTER — Ambulatory Visit (INDEPENDENT_AMBULATORY_CARE_PROVIDER_SITE_OTHER): Payer: PPO | Admitting: Family Medicine

## 2019-04-27 ENCOUNTER — Encounter: Payer: Self-pay | Admitting: Family Medicine

## 2019-04-27 ENCOUNTER — Other Ambulatory Visit: Payer: Self-pay

## 2019-04-27 VITALS — BP 120/72 | HR 73 | Temp 97.3°F | Resp 20 | Wt 205.0 lb

## 2019-04-27 DIAGNOSIS — R109 Unspecified abdominal pain: Secondary | ICD-10-CM

## 2019-04-27 DIAGNOSIS — Z125 Encounter for screening for malignant neoplasm of prostate: Secondary | ICD-10-CM

## 2019-04-27 DIAGNOSIS — I517 Cardiomegaly: Secondary | ICD-10-CM | POA: Diagnosis not present

## 2019-04-27 DIAGNOSIS — R7989 Other specified abnormal findings of blood chemistry: Secondary | ICD-10-CM | POA: Diagnosis not present

## 2019-04-27 LAB — POCT URINALYSIS DIPSTICK
Bilirubin, UA: NEGATIVE
Blood, UA: NEGATIVE
Glucose, UA: NEGATIVE
Ketones, UA: NEGATIVE
Leukocytes, UA: NEGATIVE
Nitrite, UA: NEGATIVE
Protein, UA: NEGATIVE
Spec Grav, UA: 1.01 (ref 1.010–1.025)
Urobilinogen, UA: 0.2 E.U./dL
pH, UA: 6 (ref 5.0–8.0)

## 2019-04-27 NOTE — Progress Notes (Signed)
Patient: Stephen Burch Male    DOB: 1955/08/14   63 y.o.   MRN: [id number] Visit Date: 04/27/2019  Today's Provider: Mila Merry, MD   Chief Complaint  Patient presents with  . Flank Pain   Subjective:     HPI Left Flank Pain:  Patient presents today for a follow up. Last OV was on 04/23/2019 with Maurine Minister. Patient advised to increase fluid intake to help flush out possible stones. Patient reports good compliance with treatment plan. Patient states symptoms are unchanged. Patient still has significant pain in the upper left side. Hurts to cough, deep breath or yawn. No pain with movement. Does to wake at night. Is steady when it occurs, but mostly goes away when is not coughing or taking deep breaths. No fevers. Is not having any new coughing. No dyspnea.   Patient was also noted to have low tsh of <0.010 on 12/20/2018 when checked by GI and is need of follow up.   Allergies  Allergen Reactions  . Asa [Aspirin] Shortness Of Breath    "asthma symptoms"  . Penicillins Shortness Of Breath    Has patient had a PCN reaction causing immediate rash, facial/tongue/throat swelling, SOB or lightheadedness with hypotension: Yes Has patient had a PCN reaction causing severe rash involving mucus membranes or skin necrosis: No Has patient had a PCN reaction that required hospitalization No Has patient had a PCN reaction occurring within the last 10 years: No If all of the above answers are "NO", then may proceed with Cephalosporin use. "asthma symptoms" Patient has tolerated cefazolin, ceftriaxo  . Vancomycin Anaphylaxis    Immediately following being turned into prone position and Vancomycin administration in the OR patient cardiac arrest w/  Vfib.     Current Outpatient Medications:  .  metoprolol tartrate (LOPRESSOR) 25 MG tablet, Take 0.5 tablets (12.5 mg total) by mouth 2 (two) times daily. (Patient not taking: Reported on 04/23/2019), Disp: 60 tablet, Rfl: 0 .  pantoprazole  (PROTONIX) 40 MG tablet, Take 40 mg by mouth daily., Disp: , Rfl:   Review of Systems  Constitutional: Negative.   Respiratory: Negative.   Cardiovascular: Negative.   Gastrointestinal:       Left flank pain   Musculoskeletal: Positive for myalgias (upper left side).    Social History   Tobacco Use  . Smoking status: Former Smoker    Years: 3.00    Types: Cigarettes    Quit date: 10/10/1974    Years since quitting: 44.5  . Smokeless tobacco: Never Used  Substance Use Topics  . Alcohol use: Not Currently      Objective:   BP 120/72 (BP Location: Left Arm, Patient Position: Sitting, Cuff Size: Large)   Pulse 73   Temp (!) 97.3 F (36.3 C) (Temporal)   Resp 20   Wt 205 lb (93 kg)   SpO2 96% Comment: room air  BMI 27.80 kg/m  Vitals:   04/27/19 1026  BP: 120/72  Pulse: 73  Resp: 20  Temp: (!) 97.3 F (36.3 C)  TempSrc: Temporal  SpO2: 96%  Weight: 205 lb (93 kg)  Body mass index is 27.8 kg/m.   Physical Exam   General Appearance:    Well developed, well nourished male in no acute distress  Eyes:    PERRL, conjunctiva/corneas clear, EOM's intact       Lungs:     Clear to auscultation bilaterally, respirations unlabored  Heart:    Normal heart rate. Normal  rhythm. No murmurs, rubs, or gallops.   Abd:   No abdominal, flank or back tenderness, but pain is reproduced with tapping of left posterior-lateral thorax and deep palpation of left upper quadrant.   Neurologic:   Awake, alert, oriented x 3. No apparent focal neurological           defect.       Results for orders placed or performed in visit on 04/27/19  POCT Urinalysis Dipstick  Result Value Ref Range   Color, UA yellow    Clarity, UA clear    Glucose, UA Negative Negative   Bilirubin, UA negative    Ketones, UA negative    Spec Grav, UA 1.010 1.010 - 1.025   Blood, UA negative    pH, UA 6.0 5.0 - 8.0   Protein, UA Negative Negative   Urobilinogen, UA 0.2 0.2 or 1.0 E.U./dL   Nitrite, UA negative     Leukocytes, UA Negative Negative   Appearance     Odor          Assessment & Plan    1. Acute left flank pain Likely pleuritic, possible secondary to enlarged spleen. Plain film x-rays today, will likely need LUQ imaging - DG Chest 2 View; Future - CBC - DG Abd 2 Views; Future - Comprehensive metabolic panel  2. Low TSH level  - TSH - T4  3. Prostate cancer screening  - PSA Total (Reflex To Free)      Mila Merry, MD  [facility]

## 2019-04-27 NOTE — Patient Instructions (Signed)
Please go to the lab draw station in [address] on the second floor of [facility] . Normal hours are 8:00am to 12:30pm and 1:30pm to 4:00pm Monday through Friday  Go to the [facility] on [address] for chest and abdominal Xrays

## 2019-04-28 ENCOUNTER — Other Ambulatory Visit: Payer: Self-pay | Admitting: Family Medicine

## 2019-04-28 DIAGNOSIS — E0590 Thyrotoxicosis, unspecified without thyrotoxic crisis or storm: Secondary | ICD-10-CM

## 2019-04-28 DIAGNOSIS — R109 Unspecified abdominal pain: Secondary | ICD-10-CM

## 2019-04-28 LAB — COMPREHENSIVE METABOLIC PANEL
ALT: 21 IU/L (ref 0–44)
AST: 24 IU/L (ref 0–40)
Albumin/Globulin Ratio: 2 (ref 1.2–2.2)
Albumin: 4.3 g/dL (ref 3.8–4.8)
Alkaline Phosphatase: 175 IU/L — ABNORMAL HIGH (ref 39–117)
BUN/Creatinine Ratio: 11 (ref 10–24)
BUN: 10 mg/dL (ref 8–27)
Bilirubin Total: 1.6 mg/dL — ABNORMAL HIGH (ref 0.0–1.2)
CO2: 23 mmol/L (ref 20–29)
Calcium: 8.6 mg/dL (ref 8.6–10.2)
Chloride: 103 mmol/L (ref 96–106)
Creatinine, Ser: 0.88 mg/dL (ref 0.76–1.27)
GFR calc Af Amer: 104 mL/min/{1.73_m2} (ref >59)
GFR calc non Af Amer: 90 mL/min/{1.73_m2} (ref >59)
Globulin, Total: 2.2 g/dL (ref 1.5–4.5)
Glucose: 106 mg/dL — ABNORMAL HIGH (ref 65–99)
Potassium: 3.8 mmol/L (ref 3.5–5.2)
Sodium: 141 mmol/L (ref 134–144)
Total Protein: 6.5 g/dL (ref 6.0–8.5)

## 2019-04-28 LAB — CBC
Hematocrit: 42.8 % (ref 37.5–51.0)
Hemoglobin: 14.7 g/dL (ref 13.0–17.7)
MCH: 30.4 pg (ref 26.6–33.0)
MCHC: 34.3 g/dL (ref 31.5–35.7)
MCV: 89 fL (ref 79–97)
Platelets: 78 10*3/uL — CL (ref 150–450)
RBC: 4.83 x10E6/uL (ref 4.14–5.80)
RDW: 14.2 % (ref 11.6–15.4)
WBC: 2.1 10*3/uL — CL (ref 3.4–10.8)

## 2019-04-28 LAB — T4: T4, Total: 8.2 ug/dL (ref 4.5–12.0)

## 2019-04-28 LAB — TSH: TSH: <0.005 u[IU]/mL — ABNORMAL LOW (ref 0.450–4.500)

## 2019-04-28 LAB — PSA TOTAL (REFLEX TO FREE): Prostate Specific Ag, Serum: 3.6 ng/mL (ref 0.0–4.0)

## 2019-04-29 ENCOUNTER — Other Ambulatory Visit: Payer: Self-pay | Admitting: Family Medicine

## 2019-04-29 DIAGNOSIS — R109 Unspecified abdominal pain: Secondary | ICD-10-CM

## 2019-05-05 ENCOUNTER — Other Ambulatory Visit: Payer: Self-pay

## 2019-05-05 ENCOUNTER — Ambulatory Visit
Admission: RE | Admit: 2019-05-05 | Discharge: 2019-05-05 | Disposition: A | Discharge status: Home / Self Care | Payer: PPO | Source: Ambulatory Visit | Attending: Family Medicine | Admitting: Family Medicine

## 2019-05-05 DIAGNOSIS — R918 Other nonspecific abnormal finding of lung field: Secondary | ICD-10-CM | POA: Diagnosis not present

## 2019-05-05 DIAGNOSIS — R109 Unspecified abdominal pain: Secondary | ICD-10-CM | POA: Diagnosis not present

## 2019-05-05 MED ORDER — IOHEXOL 300 MG/ML  SOLN
75.0000 mL | Freq: Once | INTRAMUSCULAR | Status: AC | PRN
  Administered 2019-05-05: 09:00:00 75 mL via INTRAVENOUS

## 2019-05-06 ENCOUNTER — Telehealth: Payer: Self-pay

## 2019-05-06 ENCOUNTER — Encounter: Payer: Self-pay | Admitting: Family Medicine

## 2019-05-06 DIAGNOSIS — R918 Other nonspecific abnormal finding of lung field: Secondary | ICD-10-CM | POA: Insufficient documentation

## 2019-05-06 MED ORDER — PREDNISONE 20 MG PO TABS: 20.0000 mg | ORAL_TABLET | Freq: Two times a day (BID) | ORAL | 0 refills | Status: DC

## 2019-05-06 MED ORDER — LEVOFLOXACIN 500 MG PO TABS: 500.0000 mg | ORAL_TABLET | Freq: Every day | ORAL | 0 refills | Status: DC

## 2019-05-06 NOTE — Telephone Encounter (Signed)
Patient was advised and medications send into pharmacy. Patient schedule appointment for 05/19/2019 @ 8:40 AM.

## 2019-05-06 NOTE — Telephone Encounter (Signed)
-----   Message from Birdie Sons, MD sent at 05/06/2019  8:06 AM EST ----- Chest ct shows several inflammatory nodules in right lung which could be an indication of infection. Only abnormality on the left is very large spleen, which is probably the source of his pain.  Need to start levofloxacin 532m once a day for 10 days for possible infections Start prednisone 251mtwice a day for 10 days for inflammation  Schedule follow up in 10-14 days.

## 2019-05-07 ENCOUNTER — Encounter (INDEPENDENT_AMBULATORY_CARE_PROVIDER_SITE_OTHER): Payer: Self-pay

## 2019-05-14 ENCOUNTER — Other Ambulatory Visit (HOSPITAL_COMMUNITY): Payer: PPO

## 2019-05-18 ENCOUNTER — Ambulatory Visit: Payer: Self-pay | Admitting: Family Medicine

## 2019-05-18 NOTE — Progress Notes (Signed)
Patient: Stephen Burch Male    DOB: [birth year]   63 y.o.   MRN: [id number] Visit Date: 05/19/2019  Today's Provider: Mila Merry, MD   Chief Complaint  Patient presents with  . Follow-up   Subjective:     HPI  Follow up for abnormal Chest CT and pleurisy: Was seen on 04-27-2019 for pleuritic flank pain on left The patient had a chest CT done on 05/05/2019 which showed several inflammatory nodules in his right lung and a very enlarged spleen. Patient was started on Levofloxacin 500mg  daily for 10 days and Prednisone 20mg  twice daily for 10 days for inflammation. Radiologist recommend follow up CT in 3 months. Patient was advised to follow up in the office in 10-14 days.    He reports fair compliance with treatment. He has completed all doses of Prednisone, but report s he has only been taking 1/2 tablet daily of Levaquin due to it causing dizziness. He feels that condition is Improved. Is no longer having the pleuritic pain on the left.   He does have chronic liver disease and significant splenomegaly follow up by GI and oncology. He is scheduled for GI follow up in January.    ------------------------------------------------------------------------------------  Allergies  Allergen Reactions  . Asa [Aspirin] Shortness Of Breath    "asthma symptoms"  . Penicillins Shortness Of Breath    Has patient had a PCN reaction causing immediate rash, facial/tongue/throat swelling, SOB or lightheadedness with hypotension: Yes Has patient had a PCN reaction causing severe rash involving mucus membranes or skin necrosis: No Has patient had a PCN reaction that required hospitalization No Has patient had a PCN reaction occurring within the last 10 years: No If all of the above answers are "NO", then may proceed with Cephalosporin use. "asthma symptoms" Patient has tolerated cefazolin, ceftriaxo  . Vancomycin Anaphylaxis    Immediately following being turned into prone position and  Vancomycin administration in the OR patient cardiac arrest w/  Vfib.     Current Outpatient Medications:  .  levofloxacin (LEVAQUIN) 500 MG tablet, Take 1 tablet (500 mg total) by mouth daily., Disp: 10 tablet, Rfl: 0 .  pantoprazole (PROTONIX) 40 MG tablet, Take 40 mg by mouth daily., Disp: , Rfl:  .  metoprolol tartrate (LOPRESSOR) 25 MG tablet, Take 0.5 tablets (12.5 mg total) by mouth 2 (two) times daily. (Patient not taking: Reported on 04/23/2019), Disp: 60 tablet, Rfl: 0 .  predniSONE (DELTASONE) 20 MG tablet, Take 1 tablet (20 mg total) by mouth 2 (two) times daily with a meal. (Patient not taking: Reported on 05/19/2019), Disp: 20 tablet, Rfl: 0  Review of Systems  Constitutional: Negative for appetite change, chills and fever.  Respiratory: Negative for chest tightness, shortness of breath and wheezing.   Cardiovascular: Negative for chest pain and palpitations.  Gastrointestinal: Negative for abdominal pain, nausea and vomiting.    Social History   Tobacco Use  . Smoking status: Former Smoker    Years: 3.00    Types: Cigarettes    Quit date: 10/10/1974    Years since quitting: 44.6  . Smokeless tobacco: Never Used  Substance Use Topics  . Alcohol use: Not Currently      Objective:   BP 106/60 (BP Location: Left Arm, Patient Position: Sitting, Cuff Size: Large)   Pulse 63   Temp (!) 97.3 F (36.3 C) (Temporal)   Resp 18   Wt 208 lb (94.3 kg)   SpO2 95% Comment: room air  BMI 28.21 kg/m  Vitals:   05/19/19 0846  BP: 106/60  Pulse: 63  Resp: 18  Temp: (!) 97.3 F (36.3 C)  TempSrc: Temporal  SpO2: 95%  Weight: 208 lb (94.3 kg)  Body mass index is 28.21 kg/m.   Physical Exam  General Appearance:    Well developed, well nourished male, alert, cooperative, in no acute distress  Eyes:    PERRL, conjunctiva/corneas clear, EOM's intact       Lungs:     Clear to auscultation bilaterally, respirations unlabored  Heart:    Normal heart rate. Normal rhythm. No  murmurs, rubs, or gallops.   Abdomen:   bowel sounds present and normal in all 4 quadrants, soft, round, nontender or nondistended. No CVA tenderness         Assessment & Plan    1. Pleurisy Resolved since completing prednisone, continues on levofloxacin as below.   2. Splenomegaly May be contributing to LUQ pain or pleurisy, but sx have greatly improved as above.    3. Ground glass opacity present on imaging of lung   4. Multiple lung nodules on CT Anticipate follow up chest CT around the first of February. He has follow up scheduled with Dr. Karilyn Cota regarding liver disease on January 19th and patient anticipates that a follow up abdominal CT will be ordered at that time. Will see if we can coordinate follow up  chest and abdominal CT     Mila Merry, MD  [facility]

## 2019-05-19 ENCOUNTER — Encounter: Payer: Self-pay | Admitting: Family Medicine

## 2019-05-19 ENCOUNTER — Ambulatory Visit (INDEPENDENT_AMBULATORY_CARE_PROVIDER_SITE_OTHER): Payer: PPO | Admitting: Family Medicine

## 2019-05-19 ENCOUNTER — Other Ambulatory Visit: Payer: Self-pay

## 2019-05-19 VITALS — BP 106/60 | HR 63 | Temp 97.3°F | Resp 18 | Wt 208.0 lb

## 2019-05-19 DIAGNOSIS — R161 Splenomegaly, not elsewhere classified: Secondary | ICD-10-CM

## 2019-05-19 DIAGNOSIS — R918 Other nonspecific abnormal finding of lung field: Secondary | ICD-10-CM

## 2019-05-19 DIAGNOSIS — R091 Pleurisy: Secondary | ICD-10-CM | POA: Diagnosis not present

## 2019-06-10 ENCOUNTER — Ambulatory Visit: Payer: Self-pay

## 2019-06-10 NOTE — Telephone Encounter (Signed)
Pt. Reports he has had elevated blood sugars. Last night it was 300. Usually his fasting glucose is 110-130. Has been running 160-190. Only symptom is lethargy. Requests an OV. Wants to see Dr. Caryn Section only. No availability this week. Please advise pt.  Reason for Disposition . New onset diabetes suspected (e.g., frequent urination, weakness, weight loss)  Answer Assessment - Initial Assessment Questions 1. BLOOD GLUCOSE: "What is your blood glucose level?"      162 2. ONSET: "When did you check the blood glucose?"     This morning 3. USUAL RANGE: "What is your glucose level usually?" (e.g., usual fasting morning value, usual evening value)     110-130 4. KETONES: "Do you check for ketones (urine or blood test strips)?" If yes, ask: "What does the test show now?"      No 5. TYPE 1 or 2:  "Do you know what type of diabetes you have?"  (e.g., Type 1, Type 2, Gestational; doesn't know)      Not diagnosed 6. INSULIN: "Do you take insulin?" "What type of insulin(s) do you use? What is the mode of delivery? (syringe, pen; injection or pump)?"      No 7. DIABETES PILLS: "Do you take any pills for your diabetes?" If yes, ask: "Have you missed taking any pills recently?"     No 8. OTHER SYMPTOMS: "Do you have any symptoms?" (e.g., fever, frequent urination, difficulty breathing, dizziness, weakness, vomiting)     Lethargy 9. PREGNANCY: "Is there any chance you are pregnant?" "When was your last menstrual period?"     n/a  Protocols used: DIABETES - HIGH BLOOD SUGAR-A-AH

## 2019-06-10 NOTE — Telephone Encounter (Signed)
Dr. Caryn Section is out of the office this afternoon, and there were no openings on his schedule for Friday. I called patient and  scheduled appointment with Dr. B for  tomorrow 06/11/2019 at 3pm.

## 2019-06-11 ENCOUNTER — Encounter: Payer: Self-pay | Admitting: Family Medicine

## 2019-06-11 ENCOUNTER — Ambulatory Visit (INDEPENDENT_AMBULATORY_CARE_PROVIDER_SITE_OTHER): Payer: PPO | Admitting: Family Medicine

## 2019-06-11 ENCOUNTER — Other Ambulatory Visit: Payer: Self-pay

## 2019-06-11 VITALS — BP 120/72 | HR 83 | Temp 97.7°F | Resp 16 | Ht 72.0 in | Wt 207.0 lb

## 2019-06-11 DIAGNOSIS — E1169 Type 2 diabetes mellitus with other specified complication: Secondary | ICD-10-CM

## 2019-06-11 LAB — POCT GLYCOSYLATED HEMOGLOBIN (HGB A1C)
Est. average glucose Bld gHb Est-mCnc: 135
Hemoglobin A1C: 6.4 % — AB (ref 4.0–5.6)

## 2019-06-11 MED ORDER — GLIPIZIDE ER 2.5 MG PO TB24: 2.5000 mg | ORAL_TABLET | Freq: Every day | ORAL | 3 refills | Status: DC

## 2019-06-11 NOTE — Progress Notes (Signed)
Patient: Stephen Burch Male    DOB: 1955-10-26   63 y.o.   MRN: [id number] Visit Date: 06/11/2019  Today's Provider: Shirlee Latch, MD   Chief Complaint  Patient presents with  . Hyperglycemia   Subjective:    I [place] S. Dimas, CMA, am acting as scribe for Shirlee Latch, MD.  HPI Patient here today C/O elevated blood sugar readings. Patient reports that his usual fasting glucose is 110-130's. Patient reports readings have been 160-190's in the last few days with some readings >200. Patient was started on Glipizide 2.5 mg daily on 11/14/2018. Patient reports taking medication up until 3 weeks ago. Patient reports he misplaced it. Reports he did find it and taking daily. A1c on 11/14/2018 was 6.1%.  Also took 10 day course of prednisone within the last month.   Lab Results  Component Value Date   HGBA1C 6.4 (A) 06/11/2019     Allergies  Allergen Reactions  . Asa [Aspirin] Shortness Of Breath    "asthma symptoms"  . Penicillins Shortness Of Breath    Has patient had a PCN reaction causing immediate rash, facial/tongue/throat swelling, SOB or lightheadedness with hypotension: Yes Has patient had a PCN reaction causing severe rash involving mucus membranes or skin necrosis: No Has patient had a PCN reaction that required hospitalization No Has patient had a PCN reaction occurring within the last 10 years: No If all of the above answers are "NO", then may proceed with Cephalosporin use. "asthma symptoms" Patient has tolerated cefazolin, ceftriaxo  . Vancomycin Anaphylaxis    Immediately following being turned into prone position and Vancomycin administration in the OR patient cardiac arrest w/  Vfib.     Current Outpatient Medications:  .  glipiZIDE (GLUCOTROL XL) 2.5 MG 24 hr tablet, Take 2.5 mg by mouth daily with breakfast., Disp: , Rfl:  .  pantoprazole (PROTONIX) 40 MG tablet, Take 40 mg by mouth daily., Disp: , Rfl:   Review of Systems  Constitutional:  Negative.   Eyes: Negative.   Cardiovascular: Negative.   Endocrine: Negative.     Social History   Tobacco Use  . Smoking status: Former Smoker    Years: 3.00    Types: Cigarettes    Quit date: 10/10/1974    Years since quitting: 44.6  . Smokeless tobacco: Never Used  Substance Use Topics  . Alcohol use: Not Currently      Objective:   BP 120/72 (BP Location: Left Arm, Patient Position: Sitting, Cuff Size: Normal)   Pulse 83   Temp 97.7 F (36.5 C) (Temporal)   Resp 16   Ht 6' (1.829 m)   Wt 207 lb (93.9 kg)   BMI 28.07 kg/m  Vitals:   06/11/19 1508  BP: 120/72  Pulse: 83  Resp: 16  Temp: 97.7 F (36.5 C)  TempSrc: Temporal  Weight: 207 lb (93.9 kg)  Height: 6' (1.829 m)  Body mass index is 28.07 kg/m.   Physical Exam Vitals reviewed.  Constitutional:      General: He is not in acute distress.    Appearance: Normal appearance. He is not diaphoretic.  HENT:     Head: Normocephalic and atraumatic.  Eyes:     General: No scleral icterus.    Conjunctiva/sclera: Conjunctivae normal.  Cardiovascular:     Rate and Rhythm: Normal rate and regular rhythm.     Pulses: Normal pulses.     Heart sounds: Normal heart sounds. No murmur.  Pulmonary:  Effort: Pulmonary effort is normal. No respiratory distress.     Breath sounds: Normal breath sounds. No wheezing or rhonchi.  Abdominal:     General: There is no distension.     Palpations: Abdomen is soft.     Tenderness: There is no abdominal tenderness.  Musculoskeletal:     Cervical back: Neck supple.     Right lower leg: No edema.     Left lower leg: No edema.  Lymphadenopathy:     Cervical: No cervical adenopathy.  Skin:    General: Skin is warm and dry.     Findings: No rash.  Neurological:     Mental Status: He is alert and oriented to person, place, and time.     Cranial Nerves: No cranial nerve deficit.  Psychiatric:        Mood and Affect: Mood normal.        Behavior: Behavior normal.       Results for orders placed or performed in visit on 06/11/19  POCT HgB A1C  Result Value Ref Range   Hemoglobin A1C 6.4 (A) 4.0 - 5.6 %   Est. average glucose Bld gHb Est-mCnc 135        Assessment & Plan    Problem List Items Addressed This Visit      Endocrine   T2DM (type 2 diabetes mellitus) (HCC) - Primary    Patient has never been formally diagnosed with T2DM, but he has had multiple occurrences of random blood sugar greater than 200 His A1c today is well controlled at 6.4 in the setting of glipizide use He has been out of glipizide for the last 3 weeks and has taken prednisone and noticed an increase in his blood sugars Reassured him that that is to be expected in those circumstances We will resume glipizide XL 2.5 mg daily as he was previously taking He is not a good candidate for metformin given his significant liver disease He was previously uninsured when prescribed glipizide, so he may be able to try an SGLT2 instead at this point Discussed low-carb diet Encouraged Pneumovax vaccination, but patient declines Follow-up in 3 months with PCP and recheck A1c      Relevant Medications   glipiZIDE (GLUCOTROL XL) 2.5 MG 24 hr tablet   Other Relevant Orders   POCT HgB A1C (Completed)      Approximately 25 minutes was spent in discussion of which greater than 50% was consultation.   Return in about 3 months (around 09/09/2019) for chronic disease f/u with PCP.   The entirety of the information documented in the History of Present Illness, Review of Systems and Physical Exam were personally obtained by me. Portions of this information were initially documented by Rondel Baton, CMA and reviewed by me for thoroughness and accuracy.    Deylan Canterbury, Marzella Schlein, MD MPH [facility]

## 2019-06-11 NOTE — Assessment & Plan Note (Signed)
Patient has never been formally diagnosed with T2DM, but he has had multiple occurrences of random blood sugar greater than 200 His A1c today is well controlled at 6.4 in the setting of glipizide use He has been out of glipizide for the last 3 weeks and has taken prednisone and noticed an increase in his blood sugars Reassured him that that is to be expected in those circumstances We will resume glipizide XL 2.5 mg daily as he was previously taking He is not a good candidate for metformin given his significant liver disease He was previously uninsured when prescribed glipizide, so he may be able to try an SGLT2 instead at this point Discussed low-carb diet Encouraged Pneumovax vaccination, but patient declines Follow-up in 3 months with PCP and recheck A1c

## 2019-06-11 NOTE — Patient Instructions (Signed)

## 2019-06-17 DIAGNOSIS — E0590 Thyrotoxicosis, unspecified without thyrotoxic crisis or storm: Secondary | ICD-10-CM | POA: Diagnosis not present

## 2019-07-14 ENCOUNTER — Encounter (INDEPENDENT_AMBULATORY_CARE_PROVIDER_SITE_OTHER): Payer: Self-pay

## 2019-07-21 ENCOUNTER — Encounter (INDEPENDENT_AMBULATORY_CARE_PROVIDER_SITE_OTHER): Payer: Self-pay | Admitting: Internal Medicine

## 2019-07-21 ENCOUNTER — Other Ambulatory Visit: Payer: Self-pay

## 2019-07-21 ENCOUNTER — Ambulatory Visit (INDEPENDENT_AMBULATORY_CARE_PROVIDER_SITE_OTHER): Payer: PPO | Admitting: Internal Medicine

## 2019-07-21 VITALS — Ht 72.0 in | Wt 209.0 lb

## 2019-07-21 DIAGNOSIS — K7581 Nonalcoholic steatohepatitis (NASH): Secondary | ICD-10-CM

## 2019-07-21 DIAGNOSIS — K7460 Unspecified cirrhosis of liver: Secondary | ICD-10-CM

## 2019-07-21 DIAGNOSIS — K219 Gastro-esophageal reflux disease without esophagitis: Secondary | ICD-10-CM

## 2019-07-21 DIAGNOSIS — K2270 Barrett's esophagus without dysplasia: Secondary | ICD-10-CM

## 2019-07-21 NOTE — Progress Notes (Signed)
Virtual Visit via Telephone Note  Patient had face-to-face scheduled visit today.  Visit was changed to virtual/telephone visit because of ongoing Covid-19 pandemic and we both agree. I connected with Stephen Burch on 07/21/19 at 13:56 AM EST by telephone and verified that I am speaking with the correct person using two identifiers.  Location: Patient: home Provider: office   I discussed the limitations, risks, security and privacy concerns of performing an evaluation and management service by telephone and the availability of in person appointments. I also discussed with the patient that there may be a patient responsible charge related to this service. The patient expressed understanding and agreed to proceed.   History of Present Illness:  Patient is 64 year old Caucasian male with history of GERD complicated by short segment Barrett's esophagus as well as cirrhosis secondary to NASH who was last seen in the office in July 2020. Reports his weight to be 209 pounds.  He says he has not lost any weight.  He is concerned about glucose levels being high.  He wonders if mold in the house is contributing to his elevated blood sugar.  He says his TSH was low and he is being evaluated by Dr. Toni Arthurs of [facility]. He feels heartburn is well controlled with therapy.  He denies dysphagia nausea vomiting hoarseness or chronic cough.  He also denies abdominal pain or lower extremity edema.  He remains on low-salt diet.   Observations/Objective:   Lab data from 04/27/2019 WBC 2.1 H&H 14.7 and 42.8 Platelet count 78K  Glucose 106, BUN 10 and creatinine 0.88 Sodium 141, potassium 3.8, chloride 103, CO2 23 Serum calcium 8.6 Bilirubin 1.6, AP 175, AST 24, ALT 21, total protein 6.5 and albumin 4.3.  Hemoglobin A1c was 6.4.   Assessment and Plan:  #1.  Chronic liver disease.  Patient has cirrhosis secondary to NASH.  He had liver biopsy in April 2014 revealing mild fibrosis and  elastography in October 2015 revealed F2 0/F1 disease.  I felt both the liver biopsy and FibroScan underestimated patient's disease.  Last EGD was in June 2018 and revealed grade 1 esophageal varices and mild portal gastropathy.  He will undergo esophagogastroduodenoscopy when Covid-19 pandemic is over. Up to this point he appears to have well-preserved hepatic function.  #2.  GERD complicated by short segment Barrett's esophagus.  Symptoms well controlled with therapy.  #3.  Leukopenia.  Leukopenia is felt to be secondary to cirrhosis splenomegaly.  #4.  Elevated blood glucose level.  Follow Up Instructions:  Abdominal ultrasound to be scheduled near future. Consider EGD to screen for esophageal varices. Patient advised to follow-up with Dr. [name] regarding elevated blood glucose levels.  Office visit in 6 months. I discussed the assessment and treatment plan with the patient. The patient was provided an opportunity to ask questions and all were answered. The patient agreed with the plan and demonstrated an understanding of the instructions.   The patient was advised to call back or seek an in-person evaluation if the symptoms worsen or if the condition fails to improve as anticipated.  I provided 12 minutes of non-face-to-face time during this encounter.   Hildred Laser, MD

## 2019-07-21 NOTE — Patient Instructions (Signed)
Physician will call with results of ultrasound when completed.

## 2019-07-22 ENCOUNTER — Other Ambulatory Visit: Payer: Self-pay | Admitting: Family Medicine

## 2019-07-22 ENCOUNTER — Telehealth: Payer: Self-pay | Admitting: Family Medicine

## 2019-07-22 DIAGNOSIS — R911 Solitary pulmonary nodule: Secondary | ICD-10-CM

## 2019-07-22 DIAGNOSIS — R918 Other nonspecific abnormal finding of lung field: Secondary | ICD-10-CM

## 2019-07-22 NOTE — Progress Notes (Signed)
Please advise patient it is time to schedule ct lung to follow up on nodule that was seen on CT in November, have sent order to Sarah to schedule.

## 2019-07-22 NOTE — Telephone Encounter (Signed)
Please advise patient it is time to schedule ct lung to follow up on nodule that was seen on CT in November, have sent order to Sarah to schedule.

## 2019-07-22 NOTE — Telephone Encounter (Signed)
Patient advised and agrees to have follow up CT. Please schedule.

## 2019-07-22 NOTE — Telephone Encounter (Signed)
Tried calling patient. Left message for patient to call back. OK for PEC to advise patient of message.

## 2019-07-27 ENCOUNTER — Ambulatory Visit (HOSPITAL_COMMUNITY): Admission: RE | Admit: 2019-07-27 | Payer: PPO | Source: Ambulatory Visit

## 2019-07-28 ENCOUNTER — Other Ambulatory Visit: Payer: Self-pay

## 2019-07-28 ENCOUNTER — Ambulatory Visit (HOSPITAL_COMMUNITY)
Admission: RE | Admit: 2019-07-28 | Discharge: 2019-07-28 | Disposition: A | Discharge status: Home / Self Care | Payer: PPO | Source: Ambulatory Visit | Attending: Internal Medicine | Admitting: Internal Medicine

## 2019-07-28 DIAGNOSIS — K7460 Unspecified cirrhosis of liver: Secondary | ICD-10-CM | POA: Diagnosis not present

## 2019-07-28 DIAGNOSIS — K7581 Nonalcoholic steatohepatitis (NASH): Secondary | ICD-10-CM | POA: Insufficient documentation

## 2019-07-30 ENCOUNTER — Inpatient Hospital Stay (HOSPITAL_COMMUNITY): Payer: PPO | Attending: Hematology

## 2019-07-30 ENCOUNTER — Other Ambulatory Visit: Payer: Self-pay

## 2019-07-30 DIAGNOSIS — R918 Other nonspecific abnormal finding of lung field: Secondary | ICD-10-CM | POA: Insufficient documentation

## 2019-07-30 DIAGNOSIS — D61818 Other pancytopenia: Secondary | ICD-10-CM

## 2019-07-30 LAB — CBC WITH DIFFERENTIAL/PLATELET
Abs Immature Granulocytes: 0 10*3/uL (ref 0.00–0.07)
Basophils Absolute: 0 10*3/uL (ref 0.0–0.1)
Basophils Relative: 1 %
Eosinophils Absolute: 0.2 10*3/uL (ref 0.0–0.5)
Eosinophils Relative: 7 %
HCT: 44.6 % (ref 39.0–52.0)
Hemoglobin: 15.2 g/dL (ref 13.0–17.0)
Immature Granulocytes: 0 %
Lymphocytes Relative: 18 %
Lymphs Abs: 0.5 10*3/uL — ABNORMAL LOW (ref 0.7–4.0)
MCH: 30.3 pg (ref 26.0–34.0)
MCHC: 34.1 g/dL (ref 30.0–36.0)
MCV: 89 fL (ref 80.0–100.0)
Monocytes Absolute: 0.2 10*3/uL (ref 0.1–1.0)
Monocytes Relative: 7 %
Neutro Abs: 1.9 10*3/uL (ref 1.7–7.7)
Neutrophils Relative %: 67 %
Platelets: 81 10*3/uL — ABNORMAL LOW (ref 150–400)
RBC: 5.01 MIL/uL (ref 4.22–5.81)
RDW: 13.2 % (ref 11.5–15.5)
WBC: 2.8 10*3/uL — ABNORMAL LOW (ref 4.0–10.5)
nRBC: 0 % (ref 0.0–0.2)

## 2019-07-30 LAB — LACTATE DEHYDROGENASE: LDH: 140 U/L (ref 98–192)

## 2019-07-30 LAB — COMPREHENSIVE METABOLIC PANEL
ALT: 24 U/L (ref 0–44)
AST: 26 U/L (ref 15–41)
Albumin: 4.1 g/dL (ref 3.5–5.0)
Alkaline Phosphatase: 124 U/L (ref 38–126)
Anion gap: 7 (ref 5–15)
BUN: 12 mg/dL (ref 8–23)
CO2: 25 mmol/L (ref 22–32)
Calcium: 8.6 mg/dL — ABNORMAL LOW (ref 8.9–10.3)
Chloride: 105 mmol/L (ref 98–111)
Creatinine, Ser: 0.98 mg/dL (ref 0.61–1.24)
GFR calc Af Amer: >60 mL/min (ref >60)
GFR calc non Af Amer: >60 mL/min (ref >60)
Glucose, Bld: 230 mg/dL — ABNORMAL HIGH (ref 70–99)
Potassium: 4.2 mmol/L (ref 3.5–5.1)
Sodium: 137 mmol/L (ref 135–145)
Total Bilirubin: 1.7 mg/dL — ABNORMAL HIGH (ref 0.3–1.2)
Total Protein: 6.9 g/dL (ref 6.5–8.1)

## 2019-07-31 ENCOUNTER — Telehealth: Payer: Self-pay

## 2019-07-31 NOTE — Telephone Encounter (Signed)
Its not contraindicated, he should get the vaccine

## 2019-07-31 NOTE — Telephone Encounter (Signed)
Copied from [place] [id number]. Topic: General - Other >> Jul 31, 2019 11:19 AM Antonieta Iba C wrote: Reason for CRM: pt says that he is allergic vancomycin. Pt says that he is scheduled to have vaccine and would like to make sure that this isn't in vaccine before he get it?   Please advise.    CB: [phone]

## 2019-07-31 NOTE — Telephone Encounter (Signed)
Please advise if Vancomycin allergy is contraindicated with COVID vaccine.

## 2019-07-31 NOTE — Telephone Encounter (Signed)
Tried calling patient. Left detailed message advising patient of message below (ok per DPR).

## 2019-08-06 ENCOUNTER — Inpatient Hospital Stay (HOSPITAL_BASED_OUTPATIENT_CLINIC_OR_DEPARTMENT_OTHER): Payer: PPO | Admitting: Hematology

## 2019-08-06 ENCOUNTER — Encounter (HOSPITAL_COMMUNITY): Payer: Self-pay | Admitting: Hematology

## 2019-08-06 ENCOUNTER — Other Ambulatory Visit: Payer: Self-pay

## 2019-08-06 VITALS — BP 152/78 | HR 71 | Temp 97.1°F | Resp 16 | Wt 212.2 lb

## 2019-08-06 DIAGNOSIS — D61818 Other pancytopenia: Secondary | ICD-10-CM

## 2019-08-06 NOTE — Progress Notes (Addendum)
On this date of service, this patient left without being seen by provider.

## 2019-08-06 NOTE — Patient Instructions (Signed)
[facility] Discharge Instructions  You were seen today by Dr. Katragadda. He went over your recent lab results. He will see you back in  for labs and follow up.   Thank you for choosing  [facility] Center at [facility] to provide your oncology and hematology care.  To afford each patient quality time with our provider, please arrive at least 15 minutes before your scheduled appointment time.   If you have a lab appointment with the Cancer Center please come in thru the  Main Entrance and check in at the main information desk  You need to re-schedule your appointment should you arrive 10 or more minutes late.  We strive to give you quality time with our providers, and arriving late affects you and other patients whose appointments are after yours.  Also, if you no show three or more times for appointments you may be dismissed from the clinic at the providers discretion.     Again, thank you for choosing [facility].  Our hope is that these requests will decrease the amount of time that you wait before being seen by our physicians.       _____________________________________________________________  Should you have questions after your visit to [facility], please contact our office at [phone] between the hours of 8:00 a.m. and 4:30 p.m.  Voicemails left after 4:00 p.m. will not be returned until the following business day.  For prescription refill requests, have your pharmacy contact our office and allow 72 hours.    Cancer Center Support Programs:   > Cancer Support Group  2nd Tuesday of the month 1pm-2pm, Journey Room    

## 2019-08-25 ENCOUNTER — Encounter: Payer: Self-pay | Admitting: Physician Assistant

## 2019-08-25 ENCOUNTER — Ambulatory Visit (INDEPENDENT_AMBULATORY_CARE_PROVIDER_SITE_OTHER): Payer: PPO | Admitting: Physician Assistant

## 2019-08-25 VITALS — BP 129/82 | HR 96 | Temp 97.3°F | Resp 16 | Ht 72.0 in | Wt 210.0 lb

## 2019-08-25 DIAGNOSIS — E1169 Type 2 diabetes mellitus with other specified complication: Secondary | ICD-10-CM | POA: Diagnosis not present

## 2019-08-25 NOTE — Patient Instructions (Signed)
Diabetes Mellitus and Exercise Exercising regularly is important for your overall health, especially when you have diabetes (diabetes mellitus). Exercising is not only about losing weight. It has many other health benefits, such as increasing muscle strength and bone density and reducing body fat and stress. This leads to improved fitness, flexibility, and endurance, all of which result in better overall health. Exercise has additional benefits for people with diabetes, including:  Reducing appetite.  Helping to lower and control blood glucose.  Lowering blood pressure.  Helping to control amounts of fatty substances (lipids) in the blood, such as cholesterol and triglycerides.  Helping the body to respond better to insulin (improving insulin sensitivity).  Reducing how much insulin the body needs.  Decreasing the risk for heart disease by: ? Lowering cholesterol and triglyceride levels. ? Increasing the levels of good cholesterol. ? Lowering blood glucose levels. What is my activity plan? Your health care provider or certified diabetes educator can help you make a plan for the type and frequency of exercise (activity plan) that works for you. Make sure that you:  Do at least 150 minutes of moderate-intensity or vigorous-intensity exercise each week. This could be brisk walking, biking, or water aerobics. ? Do stretching and strength exercises, such as yoga or weightlifting, at least 2 times a week. ? Spread out your activity over at least 3 days of the week.  Get some form of physical activity every day. ? Do not go more than 2 days in a row without some kind of physical activity. ? Avoid being inactive for more than 30 minutes at a time. Take frequent breaks to walk or stretch.  Choose a type of exercise or activity that you enjoy, and set realistic goals.  Start slowly, and gradually increase the intensity of your exercise over time. What do I need to know about managing my  diabetes?   Check your blood glucose before and after exercising. ? If your blood glucose is 240 mg/dL (13.3 mmol/L) or higher before you exercise, check your urine for ketones. If you have ketones in your urine, do not exercise until your blood glucose returns to normal. ? If your blood glucose is 100 mg/dL (5.6 mmol/L) or lower, eat a snack containing 15-20 grams of carbohydrate. Check your blood glucose 15 minutes after the snack to make sure that your level is above 100 mg/dL (5.6 mmol/L) before you start your exercise.  Know the symptoms of low blood glucose (hypoglycemia) and how to treat it. Your risk for hypoglycemia increases during and after exercise. Common symptoms of hypoglycemia can include: ? Hunger. ? Anxiety. ? Sweating and feeling clammy. ? Confusion. ? Dizziness or feeling light-headed. ? Increased heart rate or palpitations. ? Blurry vision. ? Tingling or numbness around the mouth, lips, or tongue. ? Tremors or shakes. ? Irritability.  Keep a rapid-acting carbohydrate snack available before, during, and after exercise to help prevent or treat hypoglycemia.  Avoid injecting insulin into areas of the body that are going to be exercised. For example, avoid injecting insulin into: ? The arms, when playing tennis. ? The legs, when jogging.  Keep records of your exercise habits. Doing this can help you and your health care provider adjust your diabetes management plan as needed. Write down: ? Food that you eat before and after you exercise. ? Blood glucose levels before and after you exercise. ? The type and amount of exercise you have done. ? When your insulin is expected to peak, if you use   insulin. Avoid exercising at times when your insulin is peaking.  When you start a new exercise or activity, work with your health care provider to make sure the activity is safe for you, and to adjust your insulin, medicines, or food intake as needed.  Drink plenty of water while  you exercise to prevent dehydration or heat stroke. Drink enough fluid to keep your urine clear or pale yellow. Summary  Exercising regularly is important for your overall health, especially when you have diabetes (diabetes mellitus).  Exercising has many health benefits, such as increasing muscle strength and bone density and reducing body fat and stress.  Your health care provider or certified diabetes educator can help you make a plan for the type and frequency of exercise (activity plan) that works for you.  When you start a new exercise or activity, work with your health care provider to make sure the activity is safe for you, and to adjust your insulin, medicines, or food intake as needed. This information is not intended to replace advice given to you by your health care provider. Make sure you discuss any questions you have with your health care provider. Document Revised: 01/10/2017 Document Reviewed: 11/28/2015 Elsevier Patient Education  2020 [employer]  

## 2019-08-25 NOTE — Progress Notes (Signed)
Patient: Stephen Burch Male    DOB: Apr 18, 1956   64 y.o.   MRN: [id number] Visit Date: 08/25/2019  Today's Provider: Trey Sailors, PA-C   Chief Complaint  Patient presents with  . Diabetes   Subjective:    I [place] S. Dimas, CMA, am acting as scribe for [employer], PA-C.   HPI 06/11/2019 T2DM-A1c well controlled at 6.4, continue taking Glipizide XL 2.5mg  daily. Had three days where fasting sugars were over 160. The remaining time fasting sugars have been 150-155.   Diabetes Mellitus Type II, Follow-up:   Lab Results  Component Value Date   HGBA1C 6.4 (A) 06/11/2019   HGBA1C 6.1 (A) 11/14/2018   HGBA1C 6.2 (H) 07/22/2018    Last seen for diabetes 2 months ago.  Management since then includes no changes. He reports excellent compliance with treatment. He is not having side effects.  Current symptoms include hyperglycemia, polyuria and visual disturbances and have been stable. Home blood sugar records: fasting range: 160's.   Episodes of hypoglycemia? no   Current insulin regiment: Is not on insulin Most Recent Eye Exam: [facility] in [place] Weight trend: stable Prior visit with dietician: No Current exercise: none Current diet habits: in general, an "unhealthy" diet  Pertinent Labs:    Component Value Date/Time   CHOL 120 05/01/2018 0838   TRIG 100 05/01/2018 0838   HDL 41 05/01/2018 0838   LDLCALC 59 05/01/2018 0838   CREATININE 0.98 07/30/2019 1104   CREATININE 0.83 07/16/2018 0820    Wt Readings from Last 3 Encounters:  08/25/19 210 lb (95.3 kg)  08/06/19 212 lb 4 oz (96.3 kg)  07/21/19 209 lb (94.8 kg)    ------------------------------------------------------------------------   Allergies  Allergen Reactions  . Asa [Aspirin] Shortness Of Breath    "asthma symptoms"  . Penicillins Shortness Of Breath    Has patient had a PCN reaction causing immediate rash, facial/tongue/throat swelling, SOB or lightheadedness with  hypotension: Yes Has patient had a PCN reaction causing severe rash involving mucus membranes or skin necrosis: No Has patient had a PCN reaction that required hospitalization No Has patient had a PCN reaction occurring within the last 10 years: No If all of the above answers are "NO", then may proceed with Cephalosporin use. "asthma symptoms" Patient has tolerated cefazolin, ceftriaxo  . Vancomycin Anaphylaxis    Immediately following being turned into prone position and Vancomycin administration in the OR patient cardiac arrest w/  Vfib.     Current Outpatient Medications:  .  glipiZIDE (GLUCOTROL XL) 2.5 MG 24 hr tablet, Take 1 tablet (2.5 mg total) by mouth daily with breakfast., Disp: 30 tablet, Rfl: 3 .  pantoprazole (PROTONIX) 40 MG tablet, Take 40 mg by mouth as needed. , Disp: , Rfl:   Review of Systems  Constitutional: Positive for fatigue. Negative for appetite change, chills, fever and unexpected weight change.  Eyes: Positive for visual disturbance.  Respiratory: Negative for cough, shortness of breath and wheezing.   Cardiovascular: Negative for chest pain and palpitations.  Gastrointestinal: Negative for nausea.  Endocrine: Positive for polyuria. Negative for polydipsia and polyphagia.  Neurological: Negative for dizziness, weakness, light-headedness and numbness.    Social History   Tobacco Use  . Smoking status: Former Smoker    Years: 3.00    Types: Cigarettes    Quit date: 10/10/1974    Years since quitting: 44.9  . Smokeless tobacco: Never Used  Substance Use Topics  .  Alcohol use: Not Currently      Objective:   BP 129/82 (BP Location: Left Arm, Patient Position: Sitting, Cuff Size: Normal)   Pulse 96   Temp (!) 97.3 F (36.3 C) (Temporal)   Resp 16   Ht 6' (1.829 m)   Wt 210 lb (95.3 kg)   BMI 28.48 kg/m  Vitals:   08/25/19 1510  BP: 129/82  Pulse: 96  Resp: 16  Temp: (!) 97.3 F (36.3 C)  TempSrc: Temporal  Weight: 210 lb (95.3 kg)    Height: 6' (1.829 m)  Body mass index is 28.48 kg/m.   Physical Exam Constitutional:      Appearance: Normal appearance.  Cardiovascular:     Rate and Rhythm: Normal rate and regular rhythm.     Heart sounds: Normal heart sounds.  Pulmonary:     Effort: Pulmonary effort is normal.     Breath sounds: Normal breath sounds.  Skin:    General: Skin is warm and dry.  Neurological:     Mental Status: He is alert and oriented to person, place, and time. Mental status is at baseline.  Psychiatric:        Mood and Affect: Mood normal.        Behavior: Behavior normal.      No results found for any visits on 08/25/19.     Assessment & Plan    1. Type 2 diabetes mellitus with other specified complication, without long-term current use of insulin (HCC)  Patient had three instances of fasting blood sugars above 160. The remainder have been around 150. His last a1c was 6.4% in 06/2019. Advised we may increase glipizide to 5 mg daily or he may wait to check his a1c. He would like to wait.  The entirety of the information documented in the History of Present Illness, Review of Systems and Physical Exam were personally obtained by me. Portions of this information were initially documented by Rondel Baton, CMA and reviewed by me for thoroughness and accuracy.   F/u PRN      Trey Sailors, PA-C  [facility]

## 2019-09-14 NOTE — Progress Notes (Signed)
Patient: Stephen Burch   DOB: 11/25/1955   64 y.o. Male  MRN: [id number] Visit Date: 09/15/2019  Today's Provider: Mila Merry, MD  Subjective:    Chief Complaint  Patient presents with  . Diabetes   HPI  Diabetes Mellitus Type II, Follow-up:   Lab Results  Component Value Date   HGBA1C 6.4 (A) 06/11/2019   HGBA1C 6.1 (A) 11/14/2018   HGBA1C 6.2 (H) 07/22/2018    Last seen for diabetes 3 months ago.  Management since then includes no change. He reports good compliance with treatment. He is not having side effects.  Current symptoms include none  Home blood sugar records: fasting range: 150-160's  Episodes of hypoglycemia? no   Current insulin regiment: Is not on insulin Most Recent Eye Exam: not UTD Weight trend: stable Prior visit with dietician: No Current exercise: walking Current diet habits: in general, an "unhealthy" diet  Pertinent Labs:    Component Value Date/Time   CHOL 120 05/01/2018 0838   TRIG 100 05/01/2018 0838   HDL 41 05/01/2018 0838   LDLCALC 59 05/01/2018 0838   CREATININE 0.98 07/30/2019 1104   CREATININE 0.83 07/16/2018 0820    Wt Readings from Last 3 Encounters:  08/25/19 210 lb (95.3 kg)  08/06/19 212 lb 4 oz (96.3 kg)  07/21/19 209 lb (94.8 kg)    ------------------------------------------------------------------------  He also reports he has had persistent coughing for about 3 weeks. No fevers or myalgia. No loss of taste or smell. He did get his second Covid vaccine last week. No known covid exposures.   He also states she feels very tired and sleepy during the day and will easily fall asleep when at rest.     Medications: Outpatient Medications Prior to Visit  Medication Sig Dispense Refill  . glipiZIDE (GLUCOTROL XL) 2.5 MG 24 hr tablet Take 1 tablet (2.5 mg total) by mouth daily with breakfast. 30 tablet 3  . pantoprazole (PROTONIX) 40 MG tablet Take 40 mg by mouth as needed.      No facility-administered  medications prior to visit.    Lab Results  Component Value Date   WBC 2.8 (L) 07/30/2019   HGB 15.2 07/30/2019   HCT 44.6 07/30/2019   MCV 89.0 07/30/2019   PLT 81 (L) 07/30/2019    Review of Systems  Constitutional: Negative.   Respiratory: Negative.   Cardiovascular: Negative.   Musculoskeletal: Negative.       Objective:    BP 132/76 (BP Location: Right Arm, Patient Position: Sitting, Cuff Size: Large)   Pulse 77   Temp (!) 97.3 F (36.3 C) (Temporal)   Wt 213 lb 9.6 oz (96.9 kg)   BMI 28.97 kg/m    Physical Exam    General: Appearance:     Overweight male in no acute distress  Eyes:    PERRL, conjunctiva/corneas clear, EOM's intact       Lungs:     Occasional wheezes in upper airways, respirations unlabored  Heart:    Normal heart rate. Normal rhythm. No murmurs, rubs, or gallops.   MS:   All extremities are intact.   Neurologic:   Awake, alert, oriented x 3. No apparent focal neurological           defect.        Results for orders placed or performed in visit on 09/15/19  POCT HgB A1C  Result Value Ref Range   Hemoglobin A1C 6.4 (A) 4.0 - 5.6 %  Est. average glucose Bld gHb Est-mCnc 137   POCT UA - Microalbumin  Result Value Ref Range   Microalbumin Ur, POC 20 mg/L      Assessment & Plan:    1. Type 2 diabetes mellitus with other specified complication, without long-term current use of insulin (HCC) Well controlled.  Continue current medications.    2. Hypersomnia Concerning for OSA. Order home sleep test.   3. Cough  - montelukast (SINGULAIR) 10 MG tablet; Take 1 tablet (10 mg total) by mouth at bedtime.  Dispense: 30 tablet; Refill: 1 - azithromycin (ZITHROMAX) 250 MG tablet; 2 by mouth today, then 1 daily for 4 days  Dispense: 6 tablet; Refill: 0  4. Wheezing  - montelukast (SINGULAIR) 10 MG tablet; Take 1 tablet (10 mg total) by mouth at bedtime.  Dispense: 30 tablet; Refill: 1 - azithromycin (ZITHROMAX) 250 MG tablet; 2 by mouth today,  then 1 daily for 4 days  Dispense: 6 tablet; Refill: 0  5. Multiple lung nodules on CT He missed previous follow CT scan that was ordered. Will reorder. Advised patient to expect phone call to reschedule scan. - CT CHEST LCS NODULE F/U WO  CONTRAST; Future    The entirety of the information documented in the History of Present Illness, Review of Systems and Physical Exam were personally obtained by me. Portions of this information were initially documented by Martyn Ehrich, CMA and reviewed by me for thoroughness and accuracy.   Mila Merry, MD  [facility] [phone] (phone) [phone] (fax)  [facility]

## 2019-09-15 ENCOUNTER — Ambulatory Visit (INDEPENDENT_AMBULATORY_CARE_PROVIDER_SITE_OTHER): Payer: PPO | Admitting: Family Medicine

## 2019-09-15 ENCOUNTER — Encounter: Payer: Self-pay | Admitting: Family Medicine

## 2019-09-15 ENCOUNTER — Telehealth: Payer: Self-pay

## 2019-09-15 ENCOUNTER — Other Ambulatory Visit: Payer: Self-pay

## 2019-09-15 VITALS — BP 132/76 | HR 77 | Temp 97.3°F | Wt 213.6 lb

## 2019-09-15 DIAGNOSIS — R062 Wheezing: Secondary | ICD-10-CM | POA: Diagnosis not present

## 2019-09-15 DIAGNOSIS — G4710 Hypersomnia, unspecified: Secondary | ICD-10-CM

## 2019-09-15 DIAGNOSIS — R05 Cough: Secondary | ICD-10-CM | POA: Diagnosis not present

## 2019-09-15 DIAGNOSIS — R918 Other nonspecific abnormal finding of lung field: Secondary | ICD-10-CM | POA: Diagnosis not present

## 2019-09-15 DIAGNOSIS — E1169 Type 2 diabetes mellitus with other specified complication: Secondary | ICD-10-CM | POA: Diagnosis not present

## 2019-09-15 DIAGNOSIS — E0590 Thyrotoxicosis, unspecified without thyrotoxic crisis or storm: Secondary | ICD-10-CM | POA: Insufficient documentation

## 2019-09-15 DIAGNOSIS — R059 Cough, unspecified: Secondary | ICD-10-CM

## 2019-09-15 LAB — POCT GLYCOSYLATED HEMOGLOBIN (HGB A1C)
Est. average glucose Bld gHb Est-mCnc: 137
Hemoglobin A1C: 6.4 % — AB (ref 4.0–5.6)

## 2019-09-15 LAB — POCT UA - MICROALBUMIN: Microalbumin Ur, POC: 20 mg/L

## 2019-09-15 MED ORDER — MONTELUKAST SODIUM 10 MG PO TABS: 10.0000 mg | ORAL_TABLET | Freq: Every day | ORAL | 1 refills | Status: DC

## 2019-09-15 MED ORDER — AZITHROMYCIN 250 MG PO TABS: ORAL_TABLET | ORAL | 0 refills | Status: AC

## 2019-09-15 NOTE — Telephone Encounter (Signed)
The order I placed was for lung nodule follow up CT CHEST LCS NODULE F/U WO CONTRAST

## 2019-09-15 NOTE — Telephone Encounter (Signed)
They said that LCS is for lung cancer screening

## 2019-09-15 NOTE — Telephone Encounter (Signed)
Copied from [place] [id number]. Topic: General - Other >> Sep 15, 2019 12:05 PM Parke Poisson wrote: Reason for CRM: The order you put in today is for lung cancer screening. Is this correct ?Stephen Burch It is not the same test you ordered in Jan

## 2019-09-15 NOTE — Telephone Encounter (Signed)
The one ordered in Jan is CT of chest w/o contrast

## 2019-09-15 NOTE — Patient Instructions (Addendum)
.   Please review the attached list of medications and notify my office if there are any errors.   . Please bring all of your medications to every appointment so we can make sure that our medication list is the same as yours.    One of the most common reasons people feel sleepy during the day are sleep disorders such as sleep apnea. I suggest you have a home sleep test. Call our office at [phone] at your convenience to have this arranged. Marland Kitchen

## 2019-09-16 NOTE — Telephone Encounter (Signed)
Lung cancer screening is [id number] (CT CHEST LUNG CANCER SCREENING)  I ordered [id number] (CT CHEST LCS NODULE F/U W/O CONTRAST)  Just have her look at the report from 05/05/2019 and let me know whatever order number the radiologist recommends for follow up on the lung nodules.

## 2019-09-29 ENCOUNTER — Other Ambulatory Visit: Payer: Self-pay

## 2019-09-29 ENCOUNTER — Ambulatory Visit (HOSPITAL_COMMUNITY)
Admission: RE | Admit: 2019-09-29 | Discharge: 2019-09-29 | Disposition: A | Discharge status: Home / Self Care | Payer: PPO | Source: Ambulatory Visit | Attending: Family Medicine | Admitting: Family Medicine

## 2019-09-29 DIAGNOSIS — R918 Other nonspecific abnormal finding of lung field: Secondary | ICD-10-CM | POA: Insufficient documentation

## 2019-10-15 DIAGNOSIS — E0590 Thyrotoxicosis, unspecified without thyrotoxic crisis or storm: Secondary | ICD-10-CM | POA: Diagnosis not present

## 2019-10-15 DIAGNOSIS — G4733 Obstructive sleep apnea (adult) (pediatric): Secondary | ICD-10-CM | POA: Diagnosis not present

## 2019-10-15 DIAGNOSIS — R0602 Shortness of breath: Secondary | ICD-10-CM | POA: Diagnosis not present

## 2019-10-15 DIAGNOSIS — Z79899 Other long term (current) drug therapy: Secondary | ICD-10-CM | POA: Diagnosis not present

## 2019-10-15 LAB — PULMONARY FUNCTION TEST

## 2019-10-16 DIAGNOSIS — G4733 Obstructive sleep apnea (adult) (pediatric): Secondary | ICD-10-CM | POA: Diagnosis not present

## 2019-10-16 DIAGNOSIS — R0602 Shortness of breath: Secondary | ICD-10-CM | POA: Diagnosis not present

## 2019-11-02 NOTE — Progress Notes (Signed)
Established patient visit  I,Arnisha Laffoon D Jashayla Glatfelter,acting as a scribe for [employer], [employer].,have documented all relevant documentation on the behalf of Dortha Kern, PA,as directed by  [employer] while in the presence of [employer], [place].   Patient: Stephen Burch   DOB: 1955/07/24   64 y.o. Male  MRN: [id number] Visit Date: 11/03/2019  Today's healthcare provider: Dortha Kern, PA   Chief Complaint  Patient presents with  . Arm Pain   Subjective    HPI  Left arm pain since getting first Covid-19 vaccine sometime the end of February, first of March.  He got his vaccine at [facility].  His second shot did not cause any pain but he did have some mild headache with it.   He had no fever, warmth or redness of the arm.  No swelling.  He does complain of decreased range of motion in the left arm.     Medications: Outpatient Medications Prior to Visit  Medication Sig  . glipiZIDE (GLUCOTROL XL) 2.5 MG 24 hr tablet Take 1 tablet (2.5 mg total) by mouth daily with breakfast.  . pantoprazole (PROTONIX) 40 MG tablet Take 40 mg by mouth as needed.   . montelukast (SINGULAIR) 10 MG tablet Take 1 tablet (10 mg total) by mouth at bedtime. (Patient not taking: Reported on 11/03/2019)   No facility-administered medications prior to visit.    Review of Systems  Constitutional: Negative for chills, fatigue and fever.  Respiratory: Negative for cough and shortness of breath.   Cardiovascular: Negative for chest pain.  Musculoskeletal: Positive for myalgias (left arm only). Negative for arthralgias.       Decreased range of motion in the left arm  Neurological: Negative for dizziness and headaches.      Objective    BP 130/70 (BP Location: Right Arm, Patient Position: Sitting, Cuff Size: Normal)   Pulse 67   Temp (!) 97.3 F (36.3 C) (Skin)   Wt 209 lb (94.8 kg)   SpO2 98%   BMI 28.35 kg/m    Physical Exam Constitutional:      General: He is not in acute distress.     Appearance: He is well-developed.  HENT:     Head: Normocephalic and atraumatic.     Right Ear: Hearing normal.     Left Ear: Hearing normal.     Nose: Nose normal.  Eyes:     General: Lids are normal. No scleral icterus.       Right eye: No discharge.        Left eye: No discharge.     Conjunctiva/sclera: Conjunctivae normal.  Cardiovascular:     Rate and Rhythm: Normal rate.     Pulses: Normal pulses.     Heart sounds: Normal heart sounds.  Pulmonary:     Effort: Pulmonary effort is normal. No respiratory distress.     Breath sounds: Normal breath sounds.  Musculoskeletal:        General: Tenderness present.     Cervical back: Neck supple.     Comments: Tender left deltoid insertion to midshaft of humerus. Sharp pain to elevate humerus laterally or reach behind back. Normal muscle strength and no neurologic deficit. Pulses symmetric and 2+.  Skin:    Findings: No lesion or rash.  Neurological:     Mental Status: He is alert and oriented to person, place, and time.  Psychiatric:        Speech: Speech normal.  Behavior: Behavior normal.        Thought Content: Thought content normal.      Assessment & Plan     1. Deltoid tendinitis of left shoulder Soreness in the left midshaft of humerus since first COVID vaccination in February 2021. Had the second vaccination in the right deltoid in March 2021 without any problems. No other known injury. 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 Treatment with muscle cream and heating pad affords mild short term relief. Will treat with Meloxicam (has used this NSAID without side effects in the past) and continue rest with moist heat applications. Should limit repetitive activities and recheck if no better in 10-14 days. - meloxicam (MOBIC) 15 MG tablet; Take 1 tablet (15 mg total) by mouth daily.  Dispense: 30 tablet; Refill: 0   No follow-ups on file.      Haywood Pao, PA, have  reviewed all documentation for this visit. The documentation on 11/03/19 for the exam, diagnosis, procedures, and orders are all accurate and complete.    Dortha Kern, PA  [facility] [phone] (phone) [phone] (fax)  [facility]

## 2019-11-03 ENCOUNTER — Ambulatory Visit (INDEPENDENT_AMBULATORY_CARE_PROVIDER_SITE_OTHER): Payer: PPO | Admitting: Family Medicine

## 2019-11-03 ENCOUNTER — Other Ambulatory Visit: Payer: Self-pay

## 2019-11-03 VITALS — BP 130/70 | HR 67 | Temp 97.3°F | Wt 209.0 lb

## 2019-11-03 DIAGNOSIS — M778 Other enthesopathies, not elsewhere classified: Secondary | ICD-10-CM

## 2019-11-03 MED ORDER — MELOXICAM 15 MG PO TABS: 15.0000 mg | ORAL_TABLET | Freq: Every day | ORAL | 0 refills | Status: DC

## 2019-11-03 NOTE — Patient Instructions (Signed)
Tendinitis  Tendinitis is inflammation of a tendon. A tendon is a strong cord of tissue that connects muscle to bone. Tendinitis can affect any tendon, but it most commonly affects the:  Shoulder tendon (rotator cuff).  Ankle tendon (Achilles tendon).  Elbow tendon (triceps tendon).  Tendons in the wrist. What are the causes? This condition may be caused by:  Overusing a tendon or muscle. This is common.  Age-related wear and tear.  Injury.  Inflammatory conditions, such as arthritis.  Certain medicines. What increases the risk? You are more likely to develop this condition if you do activities that involve the same movements over and over again (repetitive motions). What are the signs or symptoms? Symptoms of this condition may include:  Pain.  Tenderness.  Mild swelling.  Decreased range of motion. How is this diagnosed? This condition is diagnosed with a physical exam. You may also have tests, such as:  Ultrasound. This uses sound waves to make an image of the inside of your body in the affected area.  MRI. How is this treated? This condition may be treated by resting, icing, applying pressure (compression), and raising (elevating) the affected area above the level of your heart. This is known as Forester therapy. Treatment may also include:  Medicines to help reduce inflammation or to help reduce pain.  Exercises or physical therapy to strengthen and stretch the tendon.  A brace or splint.  Surgery. This is rarely needed. Follow these instructions at home: If you have a splint or brace:  Wear the splint or brace as told by your health care provider. Remove it only as told by your health care provider.  Loosen the splint or brace if your fingers or toes tingle, become numb, or turn cold and blue.  Keep the splint or brace clean.  If the splint or brace is not waterproof: ? Do not let it get wet. ? Cover it with a watertight covering when you take a bath  or shower. Managing pain, stiffness, and swelling  If directed, put ice on the affected area. ? If you have a removable splint or brace, remove it as told by your health care provider. ? Put ice in a plastic bag. ? Place a towel between your skin and the bag. ? Leave the ice on for 20 minutes, 2-3 times a day.  Move the fingers or toes of the affected limb often, if this applies. This can help to prevent stiffness and lessen swelling.  If directed, raise (elevate) the affected area above the level of your heart while you are sitting or lying down.  If directed, apply heat to the affected area before you exercise. Use the heat source that your health care provider recommends, such as a moist heat pack or a heating pad.     ? Place a towel between your skin and the heat source. ? Leave the heat on for 20-30 minutes. ? Remove the heat if your skin turns bright red. This is especially important if you are unable to feel pain, heat, or cold. You may have a greater risk of getting burned. Driving  Do not drive or use heavy machinery while taking prescription pain medicine.  Ask your health care provider when it is safe to drive if you have a splint or brace on any part of your arm or leg. Activity  Rest the affected area as told by your health care provider.  Return to your normal activities as told by your health care   provider. Ask your health care provider what activities are safe for you.  Avoid using the affected area while you are experiencing symptoms of tendinitis.  Do exercises as told by your health care provider. General instructions  If you have a splint, do not put pressure on any part of the splint until it is fully hardened. This may take several hours.  Wear an elastic bandage or compression wrap only as told by your health care provider.  Take over-the-counter and prescription medicines only as told by your health care provider.  Keep all follow-up visits as told  by your health care provider. This is important. Contact a health care provider if:  Your symptoms do not improve.  You develop new, unexplained problems, such as numbness in your hands. Summary  Tendinitis is inflammation of a tendon.  You are more likely to develop this condition if you do activities that involve the same movements over and over again.  This condition may be treated by resting, icing, applying pressure (compression), and elevating the area above the level of your heart. This is known as [place] therapy.  Avoid using the affected area while you are experiencing symptoms of tendinitis. This information is not intended to replace advice given to you by your health care provider. Make sure you discuss any questions you have with your health care provider. Document Revised: 12/24/2017 Document Reviewed: 11/06/2017 Elsevier Patient Education  2020 [employer]  

## 2019-11-12 NOTE — Progress Notes (Signed)
Subjective:   Stephen Burch is a 64 y.o. male who presents for Medicare Annual/Subsequent preventive examination.  I connected with Randal Carstens today by telephone and verified that I am speaking with the correct person using two identifiers. Location patient: home Location provider: work Persons participating in the virtual visit: patient, provider.   I discussed the limitations, risks, security and privacy concerns of performing an evaluation and management service by telephone and the availability of in person appointments. I also discussed with the patient that there may be a patient responsible charge related to this service. The patient expressed understanding and verbally consented to this telephonic visit.    Interactive audio and video telecommunications were attempted between this provider and patient, however failed, due to patient having technical difficulties OR patient did not have access to video capability.  We continued and completed visit with audio only.     Time Spent with patient on telephone encounter: 18 minutes  Review of Systems:  N/A  Cardiac Risk Factors include: advanced age (>64mn, >>45women);diabetes mellitus;male gender     Objective:    Vitals: There were no vitals taken for this visit.  There is no height or weight on file to calculate BMI.  Advanced Directives 11/16/2019 08/06/2019 02/11/2019 01/22/2019 12/30/2018 12/20/2018 12/20/2018  Does Patient Have a Medical Advance Directive? Yes _0  No  Type of AParamedicof ACatherineLiving will - - - - - -  Copy of [place]? No - copy requested - - - - - -  Would patient like information on creating a medical advance directive? - No - Patient declined No - Patient declined No - Patient declined - No - Patient declined No - Patient declined  Pre-existing out of facility DNR order (yellow form or pink MOST form) - - - - - - -    Tobacco Social  History   Tobacco Use  Smoking Status Former Smoker  . Years: 3.00  . Types: Cigarettes  . Quit date: 10/10/1974  . Years since quitting: 45.1  Smokeless Tobacco Never Used     Counseling given: Not Answered   Clinical Intake:  Pre-visit preparation completed: Yes  Pain : 0-10 Pain Score: 3  Pain Type: Chronic pain(Due to previous surgery.) Pain Location: Wrist Pain Orientation: Left Pain Descriptors / Indicators: Aching Pain Frequency: Constant Pain Relieving Factors: Pt uses hot and cold arthritis cream to help.  Pain Relieving Factors: Pt uses hot and cold arthritis cream to help.  Nutritional Risks: None Diabetes: Yes  How often do you need to have someone help you when you read instructions, pamphlets, or other written materials from your doctor or pharmacy?: 1 - Never   Diabetes:  Is the patient diabetic?  Yes  If diabetic, was a CBG obtained today?  No  Did the patient bring in their glucometer from home?  No  How often do you monitor your CBG's? Once every other day.   Financial Strains and Diabetes Management:  Are you having any financial strains with the device, your supplies or your medication? No .  Does the patient want to be seen by Chronic Care Management for management of their diabetes?  No  Would the patient like to be referred to a Nutritionist or for Diabetic Management?  No   Diabetic Exams:  Diabetic Eye Exam: Completed 06/05/19. Repeat yearly.  Diabetic Foot Exam: Currently due. Pt has been advised about the importance in  completing this exam. Note made to follow up on this at next in office apt.    Interpreter Needed?: No  Information entered by :: [facility], LPN  Past Medical History:  Diagnosis Date  . Acute systolic (congestive) heart failure ([place]) 04/30/2018  . Arthritis    knees, wrists  . Barrett's esophagus   . Cardiac arrest ([place]) 02/22/2016  . Complication of anesthesia    02-22-2016 intraop cardiac arrest immediately  following moving pt into prone position and vancomyocin administration , pt cardiac arrest w/ Vfib,  please refer to anesthesia record in epic  . Dysrhythmia    Afib 12/2018 - converted to NSR  . Esophageal varices determined by endoscopy ([place]) 11/2016   grade 1  . GERD (gastroesophageal reflux disease)   . Hiatal hernia   . History of adenomatous polyp of colon   . History of asthma    as child  . History of DVT (deep vein thrombosis)    right lower extremitty post-op right total knee surgery 09/ 2010,  treated w/ coumadin  . History of esophageal dilatation 07/1998   for schatzski ring  . History of kidney stones   . History of staph infection 04/2016   MSSA infection of right total knee w/ sepsis  . Hx of cardiac arrest    02-22-2016  intraoperative, immediately following moving pt into prone position and vancomyocin administration, cardiac arrest w/ Vfib (referred to anesthesia record in epic) pt extubated himself next day  . Infection of prosthetic right knee joint ([place])   . Liver cirrhosis secondary to NASH (nonalcoholic steatohepatitis) ([place])    NAFLD-- followed by dr Laural Golden---  liver bx 09-30-2012  mild portal and focal sinusoidal fibrosis  . Pyogenic arthritis of right knee joint ([place]) 04/02/2016  . Scapholunate advanced collapse of left wrist    deformity  . Shock circulatory ([place])   . Staphylococcus aureus bacteremia with sepsis ([place])   . Thrombocytopenia ([place])    10-09-2017 per pt his platelets have always been low , followed by pcp, never been referred to hematologist   Past Surgical History:  Procedure Laterality Date  . APPLICATION OF WOUND VAC Right 02/22/2016   Procedure: APPLICATION OF WOUND VAC;  Surgeon: Vickey Huger, MD;  Location: [place];  Service: Orthopedics;  Laterality: Right;  . CARDIAC CATHETERIZATION N/A 02/24/2016   Procedure: Left Heart Cath and Coronary Angiography;  Surgeon: Burnell Blanks, MD;  Location: [place] [facility];  Service:  Cardiovascular;  Laterality: N/A;  No angiographic evidence of CAD,  normal LVSF, ef 50-55%  . CARPECTOMY Left 10/17/2017   Procedure: LEFT WRIST PROXIMAL ROW CARPECTOMY WITH POSTEROR IMBROSSEOUS NERVE EXCISION;  Surgeon: Charlotte Crumb, MD;  Location: [place];  Service: Orthopedics;  Laterality: Left;  AXILLARY BLOCK  . CARPECTOMY WITH RADIAL STYLOIDECTOMY Left 02/11/2019   Procedure: LEFT WRIST RADIAL STYLOIDECTOMY;  Surgeon: Charlotte Crumb, MD;  Location: [place];  Service: Orthopedics;  Laterality: Left;  . COLONOSCOPY    . EAR CYST EXCISION Right 09/06/2014   Procedure: OPEN EXCISION BAKER'S CYST RIGHT KNEE;  Surgeon: Vickey Huger, MD;  Location: [place];  Service: Orthopedics;  Laterality: Right;  . ESOPHAGEAL DILATION  1996/ 2000  . ESOPHAGOGASTRODUODENOSCOPY N/A 09/17/2012   Procedure: ESOPHAGOGASTRODUODENOSCOPY (EGD);  Surgeon: Rogene Houston, MD;  [facility];  Service: Endoscopy;  Laterality: N/A;  200  . ESOPHAGOGASTRODUODENOSCOPY N/A 12/15/2015   Procedure: ESOPHAGOGASTRODUODENOSCOPY (EGD);  Surgeon: Rogene Houston, MD;  [facility];  Service: Endoscopy;  Laterality: N/A;  1245  . ESOPHAGOGASTRODUODENOSCOPY N/A 12/17/2016   Procedure: ESOPHAGOGASTRODUODENOSCOPY (EGD);  Surgeon: Rogene Houston, MD;  [facility];  Service: Endoscopy;  Laterality: N/A;  210  . I & D KNEE WITH POLY EXCHANGE Right 04/02/2016   Procedure: IRRIGATION AND DEBRIDEMENT RIGHT KNEE WITH POLY EXCHANGE;  Surgeon: Vickey Huger, MD;  Location: [place];  Service: Orthopedics;  Laterality: Right;  . INCISION AND DRAINAGE HEMATOMA POST LEFT  TOTAL KNEE  2012  . INGUINAL HERNIA REPAIR Bilateral 05/22/2013   Procedure: REPAIR OF RECURRENT INCARCERATED INGUINAL HERNIA WITH MESH RIGHT SIDE,  REPAIR OF RECURRENT INGUINAL HERNIA WITH MESH LEFT SIDE;  Surgeon: [name], MD;  Location: [place];  Service: General;  Laterality: Bilateral;  . INGUINAL HERNIA REPAIR Bilateral  11/1997  . INSERTION OF MESH Bilateral 05/22/2013   Procedure: INSERTION OF MESH;  Surgeon: [name], MD;  Location: [place];  Service: General;  Laterality: Bilateral;  . IRRIGATION AND DEBRIDEMENT KNEE Right 02/22/2016   Procedure: IRRIGATION AND DEBRIDEMENT KNEE;  Surgeon: Vickey Huger, MD;  Location: [place];  Service: Orthopedics;  Laterality: Right;  . Irrigation and Debridement right knee  03/23/2009   Dr. Sabra Heck, [facility]  . KNEE ARTHROSCOPY W/ SYNOVECTOMY Right 01/30/2010   AND DEBRIDEMENT OF HETEROTOPIC  . LIVER BIOPSY  09/30/2012  . REVISION TOTAL KNEE ARTHROPLASTY Left fall 2012  . TONSILLECTOMY  child  . TOTAL KNEE ARTHROPLASTY Bilateral right 09/ 2010/  left 09-18-2010  . TRANSTHORACIC ECHOCARDIOGRAM  02/22/2016   ef 30-35% (per cardiac cath 02-24-2016 normal), severe hypokinesis of the mid-apicalanteroseptal and apical myocardium,  grade 1 diastolic dysfunction/ trivial PR and TR  . TRIGGER FINGER RELEASE Left 02/11/2019   Procedure: LEFT WRIST STENOSING TENOSYNOVITIS RELEASE;  Surgeon: Charlotte Crumb, MD;  Location: [place];  Service: Orthopedics;  Laterality: Left;  MAC WITH AXILLARY BLOCK  . WRIST ARTHROPLASTY Left 10/17/2017   Procedure: CAPITATE RESURFACING ARTHROPLASTY;  Surgeon: Charlotte Crumb, MD;  Location: [facility];  Service: Orthopedics;  Laterality: Left;   Family History  Problem Relation Age of Onset  . Arthritis Father        died age 52  . GI Bleed Father        upper GI Bleed, non-ETOH cirrhosis  . Arthritis Brother   . Diabetes Brother        type 2  . Diabetes Paternal Uncle        type 2  . Heart attack Maternal Grandmother   . Heart attack Maternal Grandfather   . Colon cancer Neg Hx   . Colon polyps Neg Hx    Social History   Socioeconomic History  . Marital status: Legally Separated    Spouse name: Not on file  . Number of children: 2  . Years of education: Not on file  . Highest education level: 12th grade    Occupational History    Employer: [employer]    Comment: part time  Tobacco Use  . Smoking status: Former Smoker    Years: 3.00    Types: Cigarettes    Quit date: 10/10/1974    Years since quitting: 45.1  . Smokeless tobacco: Never Used  Substance and Sexual Activity  . Alcohol use: Not Currently  . Drug use: No  . Sexual activity: Not on file  Other Topics Concern  . Not on file  Social History Narrative   ** Merged History Encounter **  Pt lives alone.   Social Determinants of Health   Financial Resource Strain: Low Risk   . Difficulty of Paying Living Expenses: Not hard at all  Food Insecurity: No Food Insecurity  . Worried About Charity fundraiser in the Last Year: Never true  . Ran Out of Food in the Last Year: Never true  Transportation Needs: No Transportation Needs  . Lack of Transportation (Medical): No  . Lack of Transportation (Non-Medical): No  Physical Activity: Inactive  . Days of Exercise per Week: 0 days  . Minutes of Exercise per Session: 0 min  Stress: No Stress Concern Present  . Feeling of Stress : Not at all  Social Connections: Somewhat Isolated  . Frequency of Communication with Friends and Family: More than three times a week  . Frequency of Social Gatherings with Friends and Family: More than three times a week  . Attends Religious Services: More than 4 times per year  . Active Member of Clubs or Organizations: No  . Attends Archivist Meetings: Never  . Marital Status: Separated    Outpatient Encounter Medications as of 11/16/2019  Medication Sig  . clindamycin (CLEOCIN) 300 MG capsule Prior to dental procedures  . glipiZIDE (GLUCOTROL XL) 2.5 MG 24 hr tablet Take 1 tablet (2.5 mg total) by mouth daily with breakfast.  . meloxicam (MOBIC) 15 MG tablet Take 1 tablet (15 mg total) by mouth daily.  . NON FORMULARY Hot and cold arthritis cream as needed to wrist.  . pantoprazole (PROTONIX) 40 MG tablet Take 40 mg by mouth as  needed.   . montelukast (SINGULAIR) 10 MG tablet Take 1 tablet (10 mg total) by mouth at bedtime. (Patient not taking: Reported on 11/03/2019)   No facility-administered encounter medications on file as of 11/16/2019.    Activities of Daily Living In your present state of health, do you have any difficulty performing the following activities: 11/16/2019 02/11/2019  Hearing? N N  Vision? N N  Difficulty concentrating or making decisions? N N  Walking or climbing stairs? N N  Dressing or bathing? N N  Doing errands, shopping? N -  Preparing Food and eating ? N -  Using the Toilet? N -  In the past six months, have you accidently leaked urine? N -  Do you have problems with loss of bowel control? N -  Managing your Medications? N -  Managing your Finances? N -  Housekeeping or managing your Housekeeping? N -  Some recent data might be hidden    Patient Care Team: Birdie Sons, MD as PCP - General (Family Medicine) Charlotte Crumb, MD as Consulting Physician (Orthopedic Surgery) Lonia Farber, MD as Consulting Physician (Internal Medicine) Rogene Houston, MD as Consulting Physician (Gastroenterology)   Assessment:   This is a routine wellness examination for [place].  Exercise Activities and Dietary recommendations Current Exercise Habits: Home exercise routine, Type of exercise: strength training/weights;walking, Time (Minutes): 30, Frequency (Times/Week): 5, Weekly Exercise (Minutes/Week): 150, Intensity: Mild, Exercise limited by: None identified  Goals    . DIET - INCREASE WATER INTAKE     Recommend increasing water intake to 4-6 glasses a day.        Fall Risk Fall Risk  11/16/2019 05/19/2019 10/02/2017 06/18/2016 05/07/2016  Falls in the past year? 1 0 No No No  Number falls in past yr: 0 0 - - -  Injury with Fall? 0 0 - - -  Follow up Falls prevention discussed  Falls evaluation completed - - -   FALL RISK PREVENTION PERTAINING TO THE HOME:  Any stairs in or  around the home? No  If so, are there any without handrails? N/A  Home free of loose throw rugs in walkways, pet beds, electrical cords, etc? Yes  Adequate lighting in your home to reduce risk of falls? Yes   ASSISTIVE DEVICES UTILIZED TO PREVENT FALLS:  Life alert? No  Use of a cane, walker or w/c? No  Grab bars in the bathroom? Yes  Shower chair or bench in shower? No  Elevated toilet seat or a handicapped toilet? Yes    TIMED UP AND GO:  Was the test performed? No .    Depression Screen PHQ 2/9 Scores 03/03/2019 10/02/2017 10/02/2017 06/18/2016  PHQ - 2 Score 0 0 0 0  PHQ- 9 Score - 0 - -    Cognitive Function: Declined today.         There is no immunization history for the selected administration types on file for this patient.  Qualifies for Shingles Vaccine? Yes . Due for Shingrix. Pt has been advised to call insurance company to determine out of pocket expense. Advised may also receive vaccine at local pharmacy or Health Dept. Verbalized acceptance and understanding.  Tdap: Although this vaccine is not a covered service during a Wellness Exam, does the patient still wish to receive this vaccine today?  No . Advised may receive this vaccine at local pharmacy or Health Dept. Aware to provide a copy of the vaccination record if obtained from local pharmacy or Health Dept. Verbalized acceptance and understanding.  Flu Vaccine: Due fall 2021.  Pneumococcal Vaccine: Due for Pneumococcal vaccine. Does the patient want to receive this vaccine today?  No . Advised may receive this vaccine at local pharmacy or Health Dept. Aware to provide a copy of the vaccination record if obtained from local pharmacy or Health Dept. Verbalized acceptance and understanding.   Screening Tests Health Maintenance  Topic Date Due  . FOOT EXAM  Never done  . COVID-19 Vaccine (1) Never done  . PNA vac Low Risk Adult (1 of 2 - PCV13) Never done  . COLONOSCOPY  02/05/2020 (Originally 03/03/2004)  .  TETANUS/TDAP  11/15/2020 (Originally 03/03/1973)  . INFLUENZA VACCINE  01/31/2020  . HEMOGLOBIN A1C  03/17/2020  . OPHTHALMOLOGY EXAM  06/14/2020  . URINE MICROALBUMIN  09/14/2020  . Hepatitis C Screening  Completed  . HIV Screening  Completed   Cancer Screenings:  Colorectal Screening: Currently due. Pt declined a colonoscopy referral at this time and states he has a follow up with GI this summer. Pt plans to discuss procedure at that apt.  Lung Cancer Screening: (Low Dose CT Chest recommended if Age 64-80 years, 30 pack-year currently smoking OR have quit w/in 15years.) does not qualify.   Additional Screening:  Hepatitis C Screening: Up to date  Dental Screening: Recommended annual dental exams for proper oral hygiene  Community Resource Referral:  CRR required this visit?  No        Plan:  I have personally reviewed and addressed the Medicare Annual Wellness questionnaire and have noted the following in the patient's chart:  A. Medical and social history B. Use of alcohol, tobacco or illicit drugs  C. Current medications and supplements D. Functional ability and status E.  Nutritional status F.  Physical activity G. Advance directives H. List of other physicians I.  Hospitalizations, surgeries, and ER visits in previous 12 months J.  Vitals K. Screenings such as hearing and vision if needed, cognitive and depression L. Referrals and appointments   In addition, I have reviewed and discussed with patient certain preventive protocols, quality metrics, and best practice recommendations. A written personalized care plan for preventive services as well as general preventive health recommendations were provided to patient.   Glendora Score, [place]  8/47/3085 Nurse Health Advisor  Nurse Notes: Pt needs a diabetic foot exam at next in office apt. Pt declined a colonoscopy referral today and declined receiving a future pneumonia vaccine.

## 2019-11-16 ENCOUNTER — Other Ambulatory Visit: Payer: Self-pay

## 2019-11-16 ENCOUNTER — Ambulatory Visit (INDEPENDENT_AMBULATORY_CARE_PROVIDER_SITE_OTHER): Payer: PPO

## 2019-11-16 DIAGNOSIS — Z0000 Encounter for general adult medical examination without abnormal findings: Secondary | ICD-10-CM | POA: Diagnosis not present

## 2019-11-16 NOTE — Patient Instructions (Signed)
[name] , Thank you for taking time to come for your Medicare Wellness Visit. I appreciate your ongoing commitment to your health goals. Please review the following plan we discussed and let me know if I can assist you in the future.   Screening recommendations/referrals: Colonoscopy: Currently due. Declined a colonoscopy referral today. Recommended yearly ophthalmology/optometry visit for glaucoma screening and checkup Recommended yearly dental visit for hygiene and checkup  Vaccinations: Influenza vaccine: Pt declines today.  Pneumococcal vaccine: Pt declines today.  Tdap vaccine: Pt declines today.  Shingles vaccine: Pt declines today.     Advanced directives: Please bring a copy of your POA (Power of Attorney) and/or Living Will to your next appointment.   Conditions/risks identified: Fall risk preventatives discussed today. Recommend to increase water intake to 6-8 8 oz glasses a day.   Next appointment: 02/16/20 @ 9:40 AM with Dr Stephen Burch   Preventive Care 64 Years and Older, Male Preventive care refers to lifestyle choices and visits with your health care provider that can promote health and wellness. What does preventive care include?  A yearly physical exam. This is also called an annual well check.  Dental exams once or twice a year.  Routine eye exams. Ask your health care provider how often you should have your eyes checked.  Personal lifestyle choices, including:  Daily care of your teeth and gums.  Regular physical activity.  Eating a healthy diet.  Avoiding tobacco and drug use.  Limiting alcohol use.  Practicing safe sex.  Taking low doses of aspirin every day.  Taking vitamin and mineral supplements as recommended by your health care provider. What happens during an annual well check? The services and screenings done by your health care provider during your annual well check will depend on your age, overall health, lifestyle risk factors, and family  history of disease. Counseling  Your health care provider may ask you questions about your:  Alcohol use.  Tobacco use.  Drug use.  Emotional well-being.  Home and relationship well-being.  Sexual activity.  Eating habits.  History of falls.  Memory and ability to understand (cognition).  Work and work Statistician. Screening  You may have the following tests or measurements:  Height, weight, and BMI.  Blood pressure.  Lipid and cholesterol levels. These may be checked every 5 years, or more frequently if you are over 16 years old.  Skin check.  Lung cancer screening. You may have this screening every year starting at age 39 if you have a 30-pack-year history of smoking and currently smoke or have quit within the past 15 years.  Fecal occult blood test (FOBT) of the stool. You may have this test every year starting at age 49.  Flexible sigmoidoscopy or colonoscopy. You may have a sigmoidoscopy every 5 years or a colonoscopy every 10 years starting at age 8.  Prostate cancer screening. Recommendations will vary depending on your family history and other risks.  Hepatitis C blood test.  Hepatitis B blood test.  Sexually transmitted disease (STD) testing.  Diabetes screening. This is done by checking your blood sugar (glucose) after you have not eaten for a while (fasting). You may have this done every 1-3 years.  Abdominal aortic aneurysm (AAA) screening. You may need this if you are a current or former smoker.  Osteoporosis. You may be screened starting at age 57 if you are at high risk. Talk with your health care provider about your test results, treatment options, and if necessary, the need  for more tests. Vaccines  Your health care provider may recommend certain vaccines, such as:  Influenza vaccine. This is recommended every year.  Tetanus, diphtheria, and acellular pertussis (Tdap, Td) vaccine. You may need a Td booster every 10 years.  Zoster vaccine.  You may need this after age 37.  Pneumococcal 13-valent conjugate (PCV13) vaccine. One dose is recommended after age 76.  Pneumococcal polysaccharide (PPSV23) vaccine. One dose is recommended after age 55. Talk to your health care provider about which screenings and vaccines you need and how often you need them. This information is not intended to replace advice given to you by your health care provider. Make sure you discuss any questions you have with your health care provider. Document Released: 07/15/2015 Document Revised: 03/07/2016 Document Reviewed: 04/19/2015 Elsevier Interactive Patient Education  2017 [employer] Prevention in the Home Falls can cause injuries. They can happen to people of all ages. There are many things you can do to make your home safe and to help prevent falls. What can I do on the outside of my home?  Regularly fix the edges of walkways and driveways and fix any cracks.  Remove anything that might make you trip as you walk through a door, such as a raised step or threshold.  Trim any bushes or trees on the path to your home.  Use bright outdoor lighting.  Clear any walking paths of anything that might make someone trip, such as rocks or tools.  Regularly check to see if handrails are loose or broken. Make sure that both sides of any steps have handrails.  Any raised decks and porches should have guardrails on the edges.  Have any leaves, snow, or ice cleared regularly.  Use sand or salt on walking paths during winter.  Clean up any spills in your garage right away. This includes oil or grease spills. What can I do in the bathroom?  Use night lights.  Install grab bars by the toilet and in the tub and shower. Do not use towel bars as grab bars.  Use non-skid mats or decals in the tub or shower.  If you need to sit down in the shower, use a plastic, non-slip stool.  Keep the floor dry. Clean up any water that spills on the floor as soon  as it happens.  Remove soap buildup in the tub or shower regularly.  Attach bath mats securely with double-sided non-slip rug tape.  Do not have throw rugs and other things on the floor that can make you trip. What can I do in the bedroom?  Use night lights.  Make sure that you have a light by your bed that is easy to reach.  Do not use any sheets or blankets that are too big for your bed. They should not hang down onto the floor.  Have a firm chair that has side arms. You can use this for support while you get dressed.  Do not have throw rugs and other things on the floor that can make you trip. What can I do in the kitchen?  Clean up any spills right away.  Avoid walking on wet floors.  Keep items that you use a lot in easy-to-reach places.  If you need to reach something above you, use a strong step stool that has a grab bar.  Keep electrical cords out of the way.  Do not use floor polish or wax that makes floors slippery. If you must use wax, use  non-skid floor wax.  Do not have throw rugs and other things on the floor that can make you trip. What can I do with my stairs?  Do not leave any items on the stairs.  Make sure that there are handrails on both sides of the stairs and use them. Fix handrails that are broken or loose. Make sure that handrails are as long as the stairways.  Check any carpeting to make sure that it is firmly attached to the stairs. Fix any carpet that is loose or worn.  Avoid having throw rugs at the top or bottom of the stairs. If you do have throw rugs, attach them to the floor with carpet tape.  Make sure that you have a light switch at the top of the stairs and the bottom of the stairs. If you do not have them, ask someone to add them for you. What else can I do to help prevent falls?  Wear shoes that:  Do not have high heels.  Have rubber bottoms.  Are comfortable and fit you well.  Are closed at the toe. Do not wear sandals.  If  you use a stepladder:  Make sure that it is fully opened. Do not climb a closed stepladder.  Make sure that both sides of the stepladder are locked into place.  Ask someone to hold it for you, if possible.  Clearly mark and make sure that you can see:  Any grab bars or handrails.  First and last steps.  Where the edge of each step is.  Use tools that help you move around (mobility aids) if they are needed. These include:  Canes.  Walkers.  Scooters.  Crutches.  Turn on the lights when you go into a dark area. Replace any light bulbs as soon as they burn out.  Set up your furniture so you have a clear path. Avoid moving your furniture around.  If any of your floors are uneven, fix them.  If there are any pets around you, be aware of where they are.  Review your medicines with your doctor. Some medicines can make you feel dizzy. This can increase your chance of falling. Ask your doctor what other things that you can do to help prevent falls. This information is not intended to replace advice given to you by your health care provider. Make sure you discuss any questions you have with your health care provider. Document Released: 04/14/2009 Document Revised: 11/24/2015 Document Reviewed: 07/23/2014 Elsevier Interactive Patient Education  2017 [employer].

## 2020-01-19 ENCOUNTER — Other Ambulatory Visit: Payer: Self-pay

## 2020-01-19 ENCOUNTER — Encounter (INDEPENDENT_AMBULATORY_CARE_PROVIDER_SITE_OTHER): Payer: Self-pay | Admitting: Internal Medicine

## 2020-01-19 ENCOUNTER — Other Ambulatory Visit (INDEPENDENT_AMBULATORY_CARE_PROVIDER_SITE_OTHER): Payer: Self-pay | Admitting: *Deleted

## 2020-01-19 ENCOUNTER — Ambulatory Visit (INDEPENDENT_AMBULATORY_CARE_PROVIDER_SITE_OTHER): Payer: PPO | Admitting: Internal Medicine

## 2020-01-19 VITALS — BP 147/78 | HR 90 | Temp 98.8°F | Ht 72.0 in | Wt 204.5 lb

## 2020-01-19 DIAGNOSIS — R197 Diarrhea, unspecified: Secondary | ICD-10-CM | POA: Diagnosis not present

## 2020-01-19 DIAGNOSIS — K7460 Unspecified cirrhosis of liver: Secondary | ICD-10-CM | POA: Diagnosis not present

## 2020-01-19 DIAGNOSIS — K7581 Nonalcoholic steatohepatitis (NASH): Secondary | ICD-10-CM | POA: Diagnosis not present

## 2020-01-19 DIAGNOSIS — K2270 Barrett's esophagus without dysplasia: Secondary | ICD-10-CM

## 2020-01-19 MED ORDER — LOPERAMIDE HCL 2 MG PO CAPS: 2.0000 mg | ORAL_CAPSULE | Freq: Every day | ORAL | Status: DC

## 2020-01-19 NOTE — Progress Notes (Signed)
Presenting complaint;  Follow-up for chronic liver disease and GERD. Patient complains of diarrhea.  Database and subjective:  Patient is 64 year old Caucasian male who has cirrhosis secondary to NASH. He has compensated liver disease. He also has GERD complicated by short segment Barrett's esophagus. He has been trying to control heartburn with dietary measures and not taking PPI anymore. He also has low TSH for which she is being evaluated by endocrinologist. He is not on any therapy yet. Last EGD was in June 2018 revealing two patches of salmon-colored mucosa as well as grade 1 esophageal varices. He has not had a follow-up exam. He had ultrasound in January 2021 and no suspicious lesions are seen in the liver. His last colonoscopy was in January 2008 with removal of small tubular adenoma. He has not had a follow-up exam. .  Today he presents with new symptom of diarrhea. He says diarrhea started 2 months ago. He is having 5-6 stools daily. His baseline has been 2-3 stools per day and his stools have always been formed but since he is a diarrhea stools have been loose. He is not having melena or rectal bleeding or abdominal pain. He is also not having nocturnal bowel movement. His appetite is very good. He has lost 5 pounds since his last visit. At times his stool has been dark. No history of recent antibiotic use. He states he did take a single dose of clindamycin prior to dental visit 3 to 4 months ago. He has tried Pepto-Bismol and Metamucil but it did not help. He also complains of bloating which is bothersome to him. He denies nausea vomiting heartburn or dysphagia.   Current Medications: Outpatient Encounter Medications as of 01/19/2020  Medication Sig  . glipiZIDE (GLUCOTROL XL) 2.5 MG 24 hr tablet Take 1 tablet (2.5 mg total) by mouth daily with breakfast.  . NON FORMULARY Hot and cold arthritis cream as needed to wrist.  . pantoprazole (PROTONIX) 40 MG tablet Take 40 mg by mouth as  needed.   . clindamycin (CLEOCIN) 300 MG capsule Prior to dental procedures (Patient not taking: Reported on 01/19/2020)  . meloxicam (MOBIC) 15 MG tablet Take 1 tablet (15 mg total) by mouth daily. (Patient not taking: Reported on 01/19/2020)  . montelukast (SINGULAIR) 10 MG tablet Take 1 tablet (10 mg total) by mouth at bedtime. (Patient not taking: Reported on 11/03/2019)   No facility-administered encounter medications on file as of 01/19/2020.    Objective: Blood pressure (!) 147/78, pulse 90, temperature 98.8 F (37.1 C), temperature source Oral, height 6' (1.829 m), weight 204 lb 8 oz (92.8 kg). Patient is alert and in no acute distress. He is wearing a mask. He does not have asterixis. Conjunctiva is pink. Sclera is nonicteric Oropharyngeal mucosa is normal. No neck masses or thyromegaly noted. Cardiac exam with regular rhythm normal S1 and S2. No murmur or gallop noted. Lungs are clear to auscultation. Abdomen is full. Bowel sounds are normal. On palpation abdomen is soft. Spleen is easily palpable. Liver edge is indistinct. Shifting dullness is absent. No LE edema or clubbing noted. He has scar over back of his wrist and along the ulnar side. He has very limited range of motion at left wrist. He has extensive vitiligo.  Labs/studies Results:  CBC Latest Ref Rng & Units 07/30/2019 04/27/2019 02/11/2019  WBC 4.0 - 10.5 K/uL 2.8(L) 2.1(LL) 2.4(L)  Hemoglobin 13.0 - 17.0 g/dL 15.2 14.7 14.8  Hematocrit 39 - 52 % 44.6 42.8 43.6  Platelets 150 -  400 K/uL 81(L) 78(LL) PLATELET CLUMPS NOTED ON SMEAR, COUNT APPEARS DECREASED    CMP Latest Ref Rng & Units 07/30/2019 04/27/2019 02/11/2019  Glucose 70 - 99 mg/dL 230(H) 106(H) 139(H)  BUN 8 - 23 mg/dL _0 Creatinine 0.61 - 1.24 mg/dL 0.98 0.88 0.79  Sodium 135 - 145 mmol/L 137 141 139  Potassium 3.5 - 5.1 mmol/L 4.2 3.8 4.1  Chloride 98 - 111 mmol/L 105 103 108  CO2 22 - 32 mmol/L _1 Calcium 8.9 - 10.3 mg/dL 8.6(L) 8.6 8.6(L)   Total Protein 6.5 - 8.1 g/dL 6.9 6.5 6.4(L)  Total Bilirubin 0.3 - 1.2 mg/dL 1.7(H) 1.6(H) 1.7(H)  Alkaline Phos 38 - 126 U/L 124 175(H) 142(H)  AST 15 - 41 U/L _2 ALT 0 - 44 U/L _3 Hepatic Function Latest Ref Rng & Units 07/30/2019 04/27/2019 02/11/2019  Total Protein 6.5 - 8.1 g/dL 6.9 6.5 6.4(L)  Albumin 3.5 - 5.0 g/dL 4.1 4.3 3.8  AST 15 - 41 U/L _4 ALT 0 - 44 U/L _5 Alk Phosphatase 38 - 126 U/L 124 175(H) 142(H)  Total Bilirubin 0.3 - 1.2 mg/dL 1.7(H) 1.6(H) 1.7(H)  Bilirubin, Direct 0.0 - 0.2 mg/dL - - -     Assessment:  #1. Cirrhosis secondary to NASH. He has stigmata of portal hypertension i.e. splenomegaly and grade 1 esophageal varices. He is long overdue for follow-up EGD. Clinically he does not have ascites. He remains with preserved hepatic function. He is due for [facility] screening.  #2. GERD complicated by short segment Barrett's esophagus. He is managing his symptoms with diet and not taking PPI.  #3. Diarrhea of 2 months duration. He does not appear to be acutely ill. Need to rule out infection before considering other etiologies. If stools studies are negative he will need colonoscopy. Remains to be seen if diarrhea is related to thyroid disorder.  #4. History of colonic polyp. He has small tubular adenoma removed in January 2008. He has had no follow-up.   Plan:  Patient will go to the lab for CBC with differential and AFP. Stool for C. difficile antigen and toxin and GI pathogen panel. Abdominal ultrasound. Esophagogastroduodenoscopy and colonoscopy to be scheduled as soon as above completed. Office visit in 6 months.

## 2020-01-19 NOTE — Patient Instructions (Signed)
Physician will call with results of blood test and stool studies when completed. Take Imodium/loperamide OTC 2 mg by mouth daily with breakfast. Esophagogastroduodenoscopy and colonoscopy to be scheduled once blood work stool studies and ultrasound completed.

## 2020-01-20 LAB — CBC WITH DIFFERENTIAL/PLATELET
Absolute Monocytes: 260 cells/uL (ref 200–950)
Basophils Absolute: 20 cells/uL (ref 0–200)
Basophils Relative: 0.8 %
Eosinophils Absolute: 180 cells/uL (ref 15–500)
Eosinophils Relative: 7.2 %
HCT: 44 % (ref 38.5–50.0)
Hemoglobin: 15.1 g/dL (ref 13.2–17.1)
Lymphs Abs: 398 cells/uL — ABNORMAL LOW (ref 850–3900)
MCH: 30.7 pg (ref 27.0–33.0)
MCHC: 34.3 g/dL (ref 32.0–36.0)
MCV: 89.4 fL (ref 80.0–100.0)
MPV: 11.1 fL (ref 7.5–12.5)
Monocytes Relative: 10.4 %
Neutro Abs: 1643 cells/uL (ref 1500–7800)
Neutrophils Relative %: 65.7 %
Platelets: 74 10*3/uL — ABNORMAL LOW (ref 140–400)
RBC: 4.92 10*6/uL (ref 4.20–5.80)
RDW: 13.1 % (ref 11.0–15.0)
Total Lymphocyte: 15.9 %
WBC: 2.5 10*3/uL — ABNORMAL LOW (ref 3.8–10.8)

## 2020-01-20 LAB — AFP TUMOR MARKER: AFP-Tumor Marker: 2.5 ng/mL (ref <6.1)

## 2020-01-21 ENCOUNTER — Telehealth (INDEPENDENT_AMBULATORY_CARE_PROVIDER_SITE_OTHER): Payer: Self-pay | Admitting: Internal Medicine

## 2020-01-21 DIAGNOSIS — R197 Diarrhea, unspecified: Secondary | ICD-10-CM | POA: Diagnosis not present

## 2020-01-21 NOTE — Telephone Encounter (Signed)
Noted and Dr.Rehman will be made aware.

## 2020-01-21 NOTE — Telephone Encounter (Signed)
Patient presented to the office to let Dr Laural Golden know he sees  Mee Hives for Endocrinology  at the [facility] in [place] and his last office visit was 10/08/19

## 2020-01-22 NOTE — Telephone Encounter (Signed)
Dr.Rehman made aware.

## 2020-01-25 DIAGNOSIS — R197 Diarrhea, unspecified: Secondary | ICD-10-CM | POA: Diagnosis not present

## 2020-01-25 LAB — GASTROINTESTINAL PATHOGEN PANEL PCR
C. difficile Tox A/B, PCR: NOT DETECTED
Campylobacter, PCR: NOT DETECTED
Cryptosporidium, PCR: NOT DETECTED
E coli (ETEC) LT/ST PCR: NOT DETECTED
E coli (STEC) stx1/stx2, PCR: NOT DETECTED
E coli 0157, PCR: NOT DETECTED
Giardia lamblia, PCR: NOT DETECTED
Norovirus, PCR: NOT DETECTED
Rotavirus A, PCR: NOT DETECTED
Salmonella, PCR: NOT DETECTED
Shigella, PCR: NOT DETECTED

## 2020-01-26 ENCOUNTER — Other Ambulatory Visit (INDEPENDENT_AMBULATORY_CARE_PROVIDER_SITE_OTHER): Payer: Self-pay | Admitting: *Deleted

## 2020-01-26 ENCOUNTER — Encounter (INDEPENDENT_AMBULATORY_CARE_PROVIDER_SITE_OTHER): Payer: Self-pay | Admitting: *Deleted

## 2020-01-27 ENCOUNTER — Ambulatory Visit (HOSPITAL_COMMUNITY)
Admission: RE | Admit: 2020-01-27 | Discharge: 2020-01-27 | Disposition: A | Discharge status: Home / Self Care | Payer: PPO | Source: Ambulatory Visit | Attending: Internal Medicine | Admitting: Internal Medicine

## 2020-01-27 ENCOUNTER — Other Ambulatory Visit: Payer: Self-pay

## 2020-01-27 DIAGNOSIS — K7460 Unspecified cirrhosis of liver: Secondary | ICD-10-CM | POA: Diagnosis not present

## 2020-01-27 DIAGNOSIS — K7581 Nonalcoholic steatohepatitis (NASH): Secondary | ICD-10-CM | POA: Diagnosis not present

## 2020-01-27 DIAGNOSIS — R161 Splenomegaly, not elsewhere classified: Secondary | ICD-10-CM | POA: Diagnosis not present

## 2020-01-27 LAB — CLOSTRIDIUM DIFFICILE TOXIN B, QUALITATIVE, REAL-TIME PCR: Toxigenic C. Difficile by PCR: NOT DETECTED

## 2020-02-08 ENCOUNTER — Other Ambulatory Visit (INDEPENDENT_AMBULATORY_CARE_PROVIDER_SITE_OTHER): Payer: Self-pay | Admitting: *Deleted

## 2020-02-08 ENCOUNTER — Other Ambulatory Visit: Payer: Self-pay

## 2020-02-08 ENCOUNTER — Encounter (HOSPITAL_COMMUNITY): Payer: Self-pay

## 2020-02-08 ENCOUNTER — Encounter (HOSPITAL_COMMUNITY)
Admission: RE | Admit: 2020-02-08 | Discharge: 2020-02-08 | Disposition: A | Discharge status: Home / Self Care | Payer: PPO | Source: Ambulatory Visit | Attending: Internal Medicine | Admitting: Internal Medicine

## 2020-02-08 ENCOUNTER — Other Ambulatory Visit (HOSPITAL_COMMUNITY)
Admission: RE | Admit: 2020-02-08 | Discharge: 2020-02-08 | Disposition: A | Discharge status: Home / Self Care | Payer: PPO | Source: Ambulatory Visit | Attending: Internal Medicine | Admitting: Internal Medicine

## 2020-02-08 DIAGNOSIS — Z20822 Contact with and (suspected) exposure to covid-19: Secondary | ICD-10-CM | POA: Diagnosis not present

## 2020-02-08 DIAGNOSIS — K7460 Unspecified cirrhosis of liver: Secondary | ICD-10-CM

## 2020-02-08 DIAGNOSIS — K2270 Barrett's esophagus without dysplasia: Secondary | ICD-10-CM

## 2020-02-08 DIAGNOSIS — Z01810 Encounter for preprocedural cardiovascular examination: Secondary | ICD-10-CM | POA: Diagnosis not present

## 2020-02-08 DIAGNOSIS — R197 Diarrhea, unspecified: Secondary | ICD-10-CM

## 2020-02-08 DIAGNOSIS — K7581 Nonalcoholic steatohepatitis (NASH): Secondary | ICD-10-CM

## 2020-02-08 DIAGNOSIS — Z01812 Encounter for preprocedural laboratory examination: Secondary | ICD-10-CM | POA: Diagnosis not present

## 2020-02-08 LAB — COMPREHENSIVE METABOLIC PANEL
ALT: 27 U/L (ref 0–44)
AST: 27 U/L (ref 15–41)
Albumin: 4.2 g/dL (ref 3.5–5.0)
Alkaline Phosphatase: 108 U/L (ref 38–126)
Anion gap: 9 (ref 5–15)
BUN: 14 mg/dL (ref 8–23)
CO2: 23 mmol/L (ref 22–32)
Calcium: 8.7 mg/dL — ABNORMAL LOW (ref 8.9–10.3)
Chloride: 108 mmol/L (ref 98–111)
Creatinine, Ser: 0.93 mg/dL (ref 0.61–1.24)
GFR calc Af Amer: >60 mL/min (ref >60)
GFR calc non Af Amer: >60 mL/min (ref >60)
Glucose, Bld: 131 mg/dL — ABNORMAL HIGH (ref 70–99)
Potassium: 3.7 mmol/L (ref 3.5–5.1)
Sodium: 140 mmol/L (ref 135–145)
Total Bilirubin: 2.1 mg/dL — ABNORMAL HIGH (ref 0.3–1.2)
Total Protein: 7.1 g/dL (ref 6.5–8.1)

## 2020-02-08 LAB — CBC WITH DIFFERENTIAL/PLATELET
Abs Immature Granulocytes: 0.01 10*3/uL (ref 0.00–0.07)
Basophils Absolute: 0 10*3/uL (ref 0.0–0.1)
Basophils Relative: 1 %
Eosinophils Absolute: 0.2 10*3/uL (ref 0.0–0.5)
Eosinophils Relative: 8 %
HCT: 42.8 % (ref 39.0–52.0)
Hemoglobin: 14.7 g/dL (ref 13.0–17.0)
Immature Granulocytes: 0 %
Lymphocytes Relative: 16 %
Lymphs Abs: 0.4 10*3/uL — ABNORMAL LOW (ref 0.7–4.0)
MCH: 30.6 pg (ref 26.0–34.0)
MCHC: 34.3 g/dL (ref 30.0–36.0)
MCV: 89.2 fL (ref 80.0–100.0)
Monocytes Absolute: 0.2 10*3/uL (ref 0.1–1.0)
Monocytes Relative: 9 %
Neutro Abs: 1.7 10*3/uL (ref 1.7–7.7)
Neutrophils Relative %: 66 %
Platelets: 71 10*3/uL — ABNORMAL LOW (ref 150–400)
RBC: 4.8 MIL/uL (ref 4.22–5.81)
RDW: 13.4 % (ref 11.5–15.5)
WBC: 2.6 10*3/uL — ABNORMAL LOW (ref 4.0–10.5)
nRBC: 0 % (ref 0.0–0.2)

## 2020-02-08 LAB — PROTIME-INR
INR: 1.2 (ref 0.8–1.2)
Prothrombin Time: 14.6 seconds (ref 11.4–15.2)

## 2020-02-08 NOTE — Patient Instructions (Signed)
Stephen Burch  02/08/2020     @PREFPERIOPPHARMACY @   Your procedure is scheduled on 02/10/2020.  Report to [facility] at  [place].  Call this number if you have problems the morning of surgery:  [phone]   Remember: Follow the diet and prep instructions given to you by the office.                        Take these medicines the morning of surgery with A SIP OF WATER  protonix.    Do not wear jewelry, make-up or nail polish.  Do not wear lotions, powders, or perfumes. Please wear deodorant and brush your teeth.  Do not shave 48 hours prior to surgery.  Men may shave face and neck.  Do not bring valuables to the hospital.  [facility] is not responsible for any belongings or valuables.  Contacts, dentures or bridgework may not be worn into surgery.  Leave your suitcase in the car.  After surgery it may be brought to your room.  For patients admitted to the hospital, discharge time will be determined by your treatment team.  Patients discharged the day of surgery will not be allowed to drive home.   Name and phone number of your driver:   family Special instructions:  DO NOT smoke the  Morning of your procedure.  Please read over the following fact sheets that you were given. Anesthesia Post-op Instructions and Care and Recovery After Surgery       Upper Endoscopy, Adult, Care After This sheet gives you information about how to care for yourself after your procedure. Your health care provider may also give you more specific instructions. If you have problems or questions, contact your health care provider. What can I expect after the procedure? After the procedure, it is common to have:  A sore throat.  Mild stomach pain or discomfort.  Bloating.  Nausea. Follow these instructions at home:   Follow instructions from your health care provider about what to eat or drink after your procedure.  Return to your normal activities as told by your  health care provider. Ask your health care provider what activities are safe for you.  Take over-the-counter and prescription medicines only as told by your health care provider.  Do not drive for 24 hours if you were given a sedative during your procedure.  Keep all follow-up visits as told by your health care provider. This is important. Contact a health care provider if you have:  A sore throat that lasts longer than one day.  Trouble swallowing. Get help right away if:  You vomit blood or your vomit looks like coffee grounds.  You have: ? A fever. ? Bloody, black, or tarry stools. ? A severe sore throat or you cannot swallow. ? Difficulty breathing. ? Severe pain in your chest or abdomen. Summary  After the procedure, it is common to have a sore throat, mild stomach discomfort, bloating, and nausea.  Do not drive for 24 hours if you were given a sedative during the procedure.  Follow instructions from your health care provider about what to eat or drink after your procedure.  Return to your normal activities as told by your health care provider. This information is not intended to replace advice given to you by your health care provider. Make sure you discuss any questions you have with your health care provider. Document  Revised: 12/10/2017 Document Reviewed: 11/18/2017 Elsevier Patient Education  [place].  Colonoscopy, Adult, Care After This sheet gives you information about how to care for yourself after your procedure. Your health care provider may also give you more specific instructions. If you have problems or questions, contact your health care provider. What can I expect after the procedure? After the procedure, it is common to have:  A small amount of blood in your stool for 24 hours after the procedure.  Some gas.  Mild cramping or bloating of your abdomen. Follow these instructions at home: Eating and drinking   Drink enough fluid to keep  your urine pale yellow.  Follow instructions from your health care provider about eating or drinking restrictions.  Resume your normal diet as instructed by your health care provider. Avoid heavy or fried foods that are hard to digest. Activity  Rest as told by your health care provider.  Avoid sitting for a long time without moving. Get up to take short walks every 1-2 hours. This is important to improve blood flow and breathing. Ask for help if you feel weak or unsteady.  Return to your normal activities as told by your health care provider. Ask your health care provider what activities are safe for you. Managing cramping and bloating   Try walking around when you have cramps or feel bloated.  Apply heat to your abdomen as told by your health care provider. Use the heat source that your health care provider recommends, such as a moist heat pack or a heating pad. ? Place a towel between your skin and the heat source. ? Leave the heat on for 20-30 minutes. ? Remove the heat if your skin turns bright red. This is especially important if you are unable to feel pain, heat, or cold. You may have a greater risk of getting burned. General instructions  For the first 24 hours after the procedure: ? Do not drive or use machinery. ? Do not sign important documents. ? Do not drink alcohol. ? Do your regular daily activities at a slower pace than normal. ? Eat soft foods that are easy to digest.  Take over-the-counter and prescription medicines only as told by your health care provider.  Keep all follow-up visits as told by your health care provider. This is important. Contact a health care provider if:  You have blood in your stool 2-3 days after the procedure. Get help right away if you have:  More than a small spotting of blood in your stool.  Large blood clots in your stool.  Swelling of your abdomen.  Nausea or vomiting.  A fever.  Increasing pain in your abdomen that is not  relieved with medicine. Summary  After the procedure, it is common to have a small amount of blood in your stool. You may also have mild cramping and bloating of your abdomen.  For the first 24 hours after the procedure, do not drive or use machinery, sign important documents, or drink alcohol.  Get help right away if you have a lot of blood in your stool, nausea or vomiting, a fever, or increased pain in your abdomen. This information is not intended to replace advice given to you by your health care provider. Make sure you discuss any questions you have with your health care provider. Document Revised: 01/12/2019 Document Reviewed: 01/12/2019 Elsevier Patient Education  [place] After These instructions provide you with information about caring for yourself  after your procedure. Your health care provider may also give you more specific instructions. Your treatment has been planned according to current medical practices, but problems sometimes occur. Call your health care provider if you have any problems or questions after your procedure. What can I expect after the procedure? After your procedure, you may:  Feel sleepy for several hours.  Feel clumsy and have poor balance for several hours.  Feel forgetful about what happened after the procedure.  Have poor judgment for several hours.  Feel nauseous or vomit.  Have a sore throat if you had a breathing tube during the procedure. Follow these instructions at home: For at least 24 hours after the procedure:      Have a responsible adult stay with you. It is important to have someone help care for you until you are awake and alert.  Rest as needed.  Do not: ? Participate in activities in which you could fall or become injured. ? Drive. ? Use heavy machinery. ? Drink alcohol. ? Take sleeping pills or medicines that cause drowsiness. ? Make important decisions or sign legal  documents. ? Take care of children on your own. Eating and drinking  Follow the diet that is recommended by your health care provider.  If you vomit, drink water, juice, or soup when you can drink without vomiting.  Make sure you have little or no nausea before eating solid foods. General instructions  Take over-the-counter and prescription medicines only as told by your health care provider.  If you have sleep apnea, surgery and certain medicines can increase your risk for breathing problems. Follow instructions from your health care provider about wearing your sleep device: ? Anytime you are sleeping, including during daytime naps. ? While taking prescription pain medicines, sleeping medicines, or medicines that make you drowsy.  If you smoke, do not smoke without supervision.  Keep all follow-up visits as told by your health care provider. This is important. Contact a health care provider if:  You keep feeling nauseous or you keep vomiting.  You feel light-headed.  You develop a rash.  You have a fever. Get help right away if:  You have trouble breathing. Summary  For several hours after your procedure, you may feel sleepy and have poor judgment.  Have a responsible adult stay with you for at least 24 hours or until you are awake and alert. This information is not intended to replace advice given to you by your health care provider. Make sure you discuss any questions you have with your health care provider. Document Revised: 09/16/2017 Document Reviewed: 10/09/2015 Elsevier Patient Education  [place].

## 2020-02-08 NOTE — Pre-Procedure Instructions (Signed)
Patients history shows adverse reaction to anesthesia. When questioned about this, he states that it was actually an allergic reaction to Vancomycin. Patient states, "I was in the operating room for a removal of a bakers cyst and the doctor told me that as soon as they started giving me the Vancomycin, I had a cardiac arrest."

## 2020-02-09 ENCOUNTER — Ambulatory Visit: Payer: PPO | Admitting: Family Medicine

## 2020-02-09 LAB — SARS CORONAVIRUS 2 (TAT 6-24 HRS): SARS Coronavirus 2: NEGATIVE

## 2020-02-10 ENCOUNTER — Encounter (HOSPITAL_COMMUNITY)
Admission: RE | Disposition: A | Discharge status: Home / Self Care | Payer: Self-pay | Source: Home / Self Care | Attending: Internal Medicine

## 2020-02-10 ENCOUNTER — Ambulatory Visit (HOSPITAL_COMMUNITY): Payer: PPO | Admitting: Anesthesiology

## 2020-02-10 ENCOUNTER — Ambulatory Visit (HOSPITAL_COMMUNITY)
Admission: RE | Admit: 2020-02-10 | Discharge: 2020-02-10 | Disposition: A | Discharge status: Home / Self Care | Payer: PPO | Attending: Internal Medicine | Admitting: Internal Medicine

## 2020-02-10 ENCOUNTER — Encounter (HOSPITAL_COMMUNITY): Payer: Self-pay | Admitting: Internal Medicine

## 2020-02-10 ENCOUNTER — Other Ambulatory Visit: Payer: Self-pay

## 2020-02-10 DIAGNOSIS — K219 Gastro-esophageal reflux disease without esophagitis: Secondary | ICD-10-CM | POA: Insufficient documentation

## 2020-02-10 DIAGNOSIS — Z880 Allergy status to penicillin: Secondary | ICD-10-CM | POA: Diagnosis not present

## 2020-02-10 DIAGNOSIS — Z86010 Personal history of colonic polyps: Secondary | ICD-10-CM | POA: Insufficient documentation

## 2020-02-10 DIAGNOSIS — Z7984 Long term (current) use of oral hypoglycemic drugs: Secondary | ICD-10-CM | POA: Diagnosis not present

## 2020-02-10 DIAGNOSIS — D696 Thrombocytopenia, unspecified: Secondary | ICD-10-CM | POA: Insufficient documentation

## 2020-02-10 DIAGNOSIS — D125 Benign neoplasm of sigmoid colon: Secondary | ICD-10-CM | POA: Diagnosis not present

## 2020-02-10 DIAGNOSIS — I8510 Secondary esophageal varices without bleeding: Secondary | ICD-10-CM | POA: Diagnosis not present

## 2020-02-10 DIAGNOSIS — Z886 Allergy status to analgesic agent status: Secondary | ICD-10-CM | POA: Insufficient documentation

## 2020-02-10 DIAGNOSIS — K2270 Barrett's esophagus without dysplasia: Secondary | ICD-10-CM

## 2020-02-10 DIAGNOSIS — K7460 Unspecified cirrhosis of liver: Secondary | ICD-10-CM | POA: Insufficient documentation

## 2020-02-10 DIAGNOSIS — I509 Heart failure, unspecified: Secondary | ICD-10-CM | POA: Insufficient documentation

## 2020-02-10 DIAGNOSIS — Z8709 Personal history of other diseases of the respiratory system: Secondary | ICD-10-CM | POA: Diagnosis not present

## 2020-02-10 DIAGNOSIS — Z881 Allergy status to other antibiotic agents status: Secondary | ICD-10-CM | POA: Insufficient documentation

## 2020-02-10 DIAGNOSIS — K3189 Other diseases of stomach and duodenum: Secondary | ICD-10-CM | POA: Insufficient documentation

## 2020-02-10 DIAGNOSIS — K644 Residual hemorrhoidal skin tags: Secondary | ICD-10-CM | POA: Insufficient documentation

## 2020-02-10 DIAGNOSIS — D122 Benign neoplasm of ascending colon: Secondary | ICD-10-CM | POA: Insufficient documentation

## 2020-02-10 DIAGNOSIS — E119 Type 2 diabetes mellitus without complications: Secondary | ICD-10-CM | POA: Diagnosis not present

## 2020-02-10 DIAGNOSIS — I4891 Unspecified atrial fibrillation: Secondary | ICD-10-CM | POA: Insufficient documentation

## 2020-02-10 DIAGNOSIS — K766 Portal hypertension: Secondary | ICD-10-CM | POA: Diagnosis not present

## 2020-02-10 DIAGNOSIS — Z13810 Encounter for screening for upper gastrointestinal disorder: Secondary | ICD-10-CM | POA: Diagnosis not present

## 2020-02-10 DIAGNOSIS — Z86718 Personal history of other venous thrombosis and embolism: Secondary | ICD-10-CM | POA: Diagnosis not present

## 2020-02-10 DIAGNOSIS — R197 Diarrhea, unspecified: Secondary | ICD-10-CM | POA: Insufficient documentation

## 2020-02-10 DIAGNOSIS — K649 Unspecified hemorrhoids: Secondary | ICD-10-CM | POA: Diagnosis not present

## 2020-02-10 DIAGNOSIS — K7581 Nonalcoholic steatohepatitis (NASH): Secondary | ICD-10-CM | POA: Insufficient documentation

## 2020-02-10 DIAGNOSIS — Z79899 Other long term (current) drug therapy: Secondary | ICD-10-CM | POA: Insufficient documentation

## 2020-02-10 DIAGNOSIS — Z8719 Personal history of other diseases of the digestive system: Secondary | ICD-10-CM | POA: Diagnosis not present

## 2020-02-10 DIAGNOSIS — Z87891 Personal history of nicotine dependence: Secondary | ICD-10-CM | POA: Insufficient documentation

## 2020-02-10 DIAGNOSIS — Z8674 Personal history of sudden cardiac arrest: Secondary | ICD-10-CM | POA: Diagnosis not present

## 2020-02-10 DIAGNOSIS — K635 Polyp of colon: Secondary | ICD-10-CM | POA: Diagnosis not present

## 2020-02-10 DIAGNOSIS — I8500 Esophageal varices without bleeding: Secondary | ICD-10-CM | POA: Diagnosis not present

## 2020-02-10 DIAGNOSIS — Z833 Family history of diabetes mellitus: Secondary | ICD-10-CM | POA: Insufficient documentation

## 2020-02-10 DIAGNOSIS — K228 Other specified diseases of esophagus: Secondary | ICD-10-CM | POA: Diagnosis not present

## 2020-02-10 HISTORY — PX: BIOPSY: SHX5522

## 2020-02-10 HISTORY — PX: COLONOSCOPY WITH PROPOFOL: SHX5780

## 2020-02-10 HISTORY — PX: ESOPHAGOGASTRODUODENOSCOPY (EGD) WITH PROPOFOL: SHX5813

## 2020-02-10 LAB — GLUCOSE, CAPILLARY
Glucose-Capillary: 120 mg/dL — ABNORMAL HIGH (ref 70–99)
Glucose-Capillary: 108 mg/dL — ABNORMAL HIGH (ref 70–99)

## 2020-02-10 SURGERY — COLONOSCOPY WITH PROPOFOL
Anesthesia: General

## 2020-02-10 MED ORDER — CHLORHEXIDINE GLUCONATE CLOTH 2 % EX PADS: 6.0000 | MEDICATED_PAD | Freq: Once | CUTANEOUS | Status: DC

## 2020-02-10 MED ORDER — PROPOFOL 500 MG/50ML IV EMUL
INTRAVENOUS | Status: DC | PRN
  Administered 2020-02-10: 125 ug/kg/min via INTRAVENOUS
  Administered 2020-02-10: 75 ug/kg/min via INTRAVENOUS

## 2020-02-10 MED ORDER — NADOLOL 20 MG PO TABS
20.0000 mg | ORAL_TABLET | Freq: Every day | ORAL | 11 refills | Status: DC
Start: 2020-02-10 — End: 2020-10-24

## 2020-02-10 MED ORDER — STERILE WATER FOR IRRIGATION IR SOLN
Status: DC | PRN
  Administered 2020-02-10: 2.5 mL

## 2020-02-10 MED ORDER — LACTATED RINGERS IV SOLN: INTRAVENOUS | Status: DC

## 2020-02-10 MED ORDER — GLYCOPYRROLATE 0.2 MG/ML IJ SOLN
INTRAMUSCULAR | Status: AC
  Filled 2020-02-10: qty 1

## 2020-02-10 MED ORDER — LIDOCAINE VISCOUS HCL 2 % MT SOLN
15.0000 mL | Freq: Once | OROMUCOSAL | Status: AC
  Administered 2020-02-10: 15 mL via OROMUCOSAL

## 2020-02-10 MED ORDER — LIDOCAINE VISCOUS HCL 2 % MT SOLN
OROMUCOSAL | Status: AC
  Filled 2020-02-10: qty 15

## 2020-02-10 MED ORDER — GLYCOPYRROLATE 0.2 MG/ML IJ SOLN
0.2000 mg | Freq: Once | INTRAMUSCULAR | Status: AC
  Administered 2020-02-10: 0.2 mg via INTRAVENOUS

## 2020-02-10 MED ORDER — PROPOFOL 10 MG/ML IV BOLUS
INTRAVENOUS | Status: DC | PRN
  Administered 2020-02-10: 30 mg via INTRAVENOUS
  Administered 2020-02-10 (×2): 20 mg via INTRAVENOUS

## 2020-02-10 NOTE — Anesthesia Preprocedure Evaluation (Signed)
Anesthesia Evaluation  Patient identified by MRN, date of birth, ID band Patient awake    Reviewed: Allergy & Precautions, H&P , NPO status , Patient's Chart, lab work & pertinent test results, reviewed documented beta blocker date and time   History of Anesthesia Complications (+) history of anesthetic complications  Airway Mallampati: II  TM Distance: >3 FB Neck ROM: full    Dental no notable dental hx.    Pulmonary asthma , former smoker,    Pulmonary exam normal breath sounds clear to auscultation       Cardiovascular Exercise Tolerance: Good +CHF   Rhythm:regular Rate:Normal     Neuro/Psych negative neurological ROS  negative psych ROS   GI/Hepatic hiatal hernia, GERD  Medicated,(+) Hepatitis -  Endo/Other  diabetes, Type 2Hyperthyroidism   Renal/GU negative Renal ROS  negative genitourinary   Musculoskeletal   Abdominal   Peds  Hematology negative hematology ROS (+)   Anesthesia Other Findings   Reproductive/Obstetrics negative OB ROS                             Anesthesia Physical Anesthesia Plan  ASA: III  Anesthesia Plan: General   Post-op Pain Management:    Induction:   PONV Risk Score and Plan: Propofol infusion  Airway Management Planned:   Additional Equipment:   Intra-op Plan:   Post-operative Plan:   Informed Consent: I have reviewed the patients History and Physical, chart, labs and discussed the procedure including the risks, benefits and alternatives for the proposed anesthesia with the patient or authorized representative who has indicated his/her understanding and acceptance.     Dental Advisory Given  Plan Discussed with: CRNA  Anesthesia Plan Comments:         Anesthesia Quick Evaluation

## 2020-02-10 NOTE — Op Note (Signed)
[facility] Patient Name: Stephen Burch Procedure Date: 02/10/2020 10:07 AM MRN: [id number] Date of Birth: January 24, 1956 Attending MD: Lionel December , MD CSN: [id number] Age: 64 Admit Type: Ambulatory Procedure:                Colonoscopy Indications:              Clinically significant diarrhea of unexplained                            origin Providers:                Lionel December, MD, Nena Polio, RN, Dyann Ruddle Referring MD:             Demetrios Isaacs. Fisher, MD Medicines:                Propofol per Anesthesia Complications:            No immediate complications. Estimated Blood Loss:     Estimated blood loss was minimal. Procedure:                Pre-Anesthesia Assessment:                           - Prior to the procedure, a History and Physical                            was performed, and patient medications and                            allergies were reviewed. The patient's tolerance of                            previous anesthesia was also reviewed. The risks                            and benefits of the procedure and the sedation                            options and risks were discussed with the patient.                            All questions were answered, and informed consent                            was obtained. Prior Anticoagulants: The patient has                            taken no previous anticoagulant or antiplatelet                            agents. ASA Grade Assessment: III - A patient with                            severe systemic disease. After reviewing the risks  and benefits, the patient was deemed in                            satisfactory condition to undergo the procedure.                           After obtaining informed consent, the colonoscope                            was passed under direct vision. Throughout the                            procedure, the patient's blood pressure, pulse, and                             oxygen saturations were monitored continuously. The                            [id number]-[id number] ([id number]) scope was introduced through                            the anus and advanced to the the cecum, identified                            by appendiceal orifice and ileocecal valve. The                            colonoscopy was performed without difficulty. The                            patient tolerated the procedure well. The quality                            of the bowel preparation was good. The ileocecal                            valve, appendiceal orifice, and rectum were                            photographed. Scope In: 10:10:09 AM Scope Out: 10:34:30 AM Scope Withdrawal Time: 0 hours 20 minutes 11 seconds  Total Procedure Duration: 0 hours 24 minutes 21 seconds  Findings:      The perianal and digital rectal examinations were normal.      Two sessile polyps were found in the sigmoid colon and ascending colon.       The polyps were small in size. These polyps were removed with a cold       snare. Resection and retrieval were complete. The pathology specimen was       placed into Bottle Number 2.      The exam was otherwise normal throughout the examined colon.      Biopsies for histology were taken with a cold forceps from the ascending       colon and sigmoid colon for evaluation of microscopic colitis.      External hemorrhoids were found  during retroflexion. The hemorrhoids       were small. Impression:               - Two small polyps in the sigmoid colon and in the                            ascending colon, removed with a cold snare.                            Resected and retrieved.                           - External hemorrhoids.                           - Biopsies were taken with a cold forceps from the                            ascending colon and sigmoid colon for evaluation of                            microscopic colitis. Moderate Sedation:      Per  Anesthesia Care Recommendation:           - Patient has a contact number available for                            emergencies. The signs and symptoms of potential                            delayed complications were discussed with the                            patient. Return to normal activities tomorrow.                            Written discharge instructions were provided to the                            patient.                           - Resume previous diet today.                           - Continue present medications.                           - No aspirin, ibuprofen, naproxen, or other                            non-steroidal anti-inflammatory drugs.                           - Await pathology results.                           -  Repeat colonoscopy is recommended. The                            colonoscopy date will be determined after pathology                            results from today's exam become available for                            review. Procedure Code(s):        --- Professional ---                           [phone], Colonoscopy, flexible; with removal of                            tumor(s), polyp(s), or other lesion(s) by snare                            technique                           45380, 59, Colonoscopy, flexible; with biopsy,                            single or multiple Diagnosis Code(s):        --- Professional ---                           K63.5, Polyp of colon                           K64.4, Residual hemorrhoidal skin tags                           R19.7, Diarrhea, unspecified CPT copyright 2019 American Medical Association. All rights reserved. The codes documented in this report are preliminary and upon coder review may  be revised to meet current compliance requirements. Lionel December, MD Lionel December, MD 02/10/2020 10:46:41 AM This report has been signed electronically. Number of Addenda: 0

## 2020-02-10 NOTE — H&P (Signed)
Stephen Burch is an 64 y.o. male.   Chief Complaint: Patient is here for esophagogastroduodenoscopy and colonoscopy. HPI: Patient is 64 year old Caucasian male who has cirrhosis secondary to NASH who is here for esophagogastroduodenoscopy with esophageal variceal banding if he has grade 3-year for esophageal varices.  He does have history of short segment Barrett's esophagus.  He is not taking PPI by his choice.  He is watching his diet and not having any heartburn.  He also has diarrhea.  He was seen in the office recently and stool studies were negative.  He had GI pathogen panel as well as C. difficile testing.  He says diarrhea has improved but not resolved.  No history of rectal bleeding. Has chronic leukopenia and thrombocytopenia.  Recent platelet count 74,000.  INR 1.2. Meld score based on studies from last week is 11.  Past Medical History:  Diagnosis Date  . Acute systolic (congestive) heart failure ([place]) 04/30/2018  . Arthritis    knees, wrists  . Barrett's esophagus   . Cardiac arrest ([place]) 02/22/2016  . Complication of anesthesia    02-22-2016 intraop cardiac arrest immediately after vancomyocin administration , pt cardiac arrest w/ Vfib,  please refer to anesthesia record in epic  . Dysrhythmia    Afib 12/2018 - converted to NSR  . Esophageal varices determined by endoscopy ([place]) 11/2016   grade 1  . GERD (gastroesophageal reflux disease)   . Hiatal hernia   . History of adenomatous polyp of colon   . History of asthma    as child  . History of DVT (deep vein thrombosis)    right lower extremitty post-op right total knee surgery 09/ 2010,  treated w/ coumadin  . History of esophageal dilatation 07/1998   for schatzski ring  . History of kidney stones   . History of staph infection 04/2016   MSSA infection of right total knee w/ sepsis  . Hx of cardiac arrest    02-22-2016  intraoperative, immediately following moving pt into prone position and vancomyocin administration,  cardiac arrest w/ Vfib (referred to anesthesia record in epic) pt extubated himself next day  . Infection of prosthetic right knee joint ([place])   . Liver cirrhosis secondary to NASH (nonalcoholic steatohepatitis) ([place])    NAFLD-- followed by dr Laural Golden---  liver bx 09-30-2012  mild portal and focal sinusoidal fibrosis  . Pyogenic arthritis of right knee joint ([place]) 04/02/2016  . Scapholunate advanced collapse of left wrist    deformity  . Shock circulatory ([place])   . Staphylococcus aureus bacteremia with sepsis ([place])   . Thrombocytopenia ([place])    10-09-2017 per pt his platelets have always been low , followed by pcp, never been referred to hematologist    Past Surgical History:  Procedure Laterality Date  . APPLICATION OF WOUND VAC Right 02/22/2016   Procedure: APPLICATION OF WOUND VAC;  Surgeon: Vickey Huger, MD;  Location: [place];  Service: Orthopedics;  Laterality: Right;  . CARDIAC CATHETERIZATION N/A 02/24/2016   Procedure: Left Heart Cath and Coronary Angiography;  Surgeon: Burnell Blanks, MD;  Location: [facility];  Service: Cardiovascular;  Laterality: N/A;  No angiographic evidence of CAD,  normal LVSF, ef 50-55%  . CARPECTOMY Left 10/17/2017   Procedure: LEFT WRIST PROXIMAL ROW CARPECTOMY WITH POSTEROR IMBROSSEOUS NERVE EXCISION;  Surgeon: Charlotte Crumb, MD;  Location: [place];  Service: Orthopedics;  Laterality: Left;  AXILLARY BLOCK  . CARPECTOMY WITH RADIAL STYLOIDECTOMY Left 02/11/2019   Procedure:  LEFT WRIST RADIAL STYLOIDECTOMY;  Surgeon: Charlotte Crumb, MD;  Location: [facility];  Service: Orthopedics;  Laterality: Left;  . COLONOSCOPY    . EAR CYST EXCISION Right 09/06/2014   Procedure: OPEN EXCISION BAKER'S CYST RIGHT KNEE;  Surgeon: Vickey Huger, MD;  Location: [facility];  Service: Orthopedics;  Laterality: Right;  . ESOPHAGEAL DILATION  1996/ 2000  . ESOPHAGOGASTRODUODENOSCOPY N/A 09/17/2012   Procedure: ESOPHAGOGASTRODUODENOSCOPY (EGD);  Surgeon:  Rogene Houston, MD;  [facility];  Service: Endoscopy;  Laterality: N/A;  200  . ESOPHAGOGASTRODUODENOSCOPY N/A 12/15/2015   Procedure: ESOPHAGOGASTRODUODENOSCOPY (EGD);  Surgeon: Rogene Houston, MD;  [facility];  Service: Endoscopy;  Laterality: N/A;  1245  . ESOPHAGOGASTRODUODENOSCOPY N/A 12/17/2016   Procedure: ESOPHAGOGASTRODUODENOSCOPY (EGD);  Surgeon: Rogene Houston, MD;  [facility];  Service: Endoscopy;  Laterality: N/A;  210  . I & D KNEE WITH POLY EXCHANGE Right 04/02/2016   Procedure: IRRIGATION AND DEBRIDEMENT RIGHT KNEE WITH POLY EXCHANGE;  Surgeon: Vickey Huger, MD;  Location: [facility];  Service: Orthopedics;  Laterality: Right;  . INCISION AND DRAINAGE HEMATOMA POST LEFT  TOTAL KNEE  2012  . INGUINAL HERNIA REPAIR Bilateral 05/22/2013   Procedure: REPAIR OF RECURRENT INCARCERATED INGUINAL HERNIA WITH MESH RIGHT SIDE,  REPAIR OF RECURRENT INGUINAL HERNIA WITH MESH LEFT SIDE;  Surgeon: [name], MD;  Location: [place] [facility];  Service: General;  Laterality: Bilateral;  . INGUINAL HERNIA REPAIR Bilateral 11/1997  . INSERTION OF MESH Bilateral 05/22/2013   Procedure: INSERTION OF MESH;  Surgeon: [name], MD;  Location: [facility];  Service: General;  Laterality: Bilateral;  . IRRIGATION AND DEBRIDEMENT KNEE Right 02/22/2016   Procedure: IRRIGATION AND DEBRIDEMENT KNEE;  Surgeon: Vickey Huger, MD;  Location: [facility];  Service: Orthopedics;  Laterality: Right;  . Irrigation and Debridement right knee  03/23/2009   Dr. Sabra Heck, [facility]  . KNEE ARTHROSCOPY W/ SYNOVECTOMY Right 01/30/2010   AND DEBRIDEMENT OF HETEROTOPIC  . LIVER BIOPSY  09/30/2012  . REVISION TOTAL KNEE ARTHROPLASTY Left fall 2012  . TONSILLECTOMY  child  . TOTAL KNEE ARTHROPLASTY Bilateral right 09/ 2010/  left 09-18-2010  . TRANSTHORACIC ECHOCARDIOGRAM  02/22/2016   ef 30-35% (per cardiac cath 02-24-2016 normal), severe hypokinesis of the mid-apicalanteroseptal and apical myocardium,   grade 1 diastolic dysfunction/ trivial PR and TR  . TRIGGER FINGER RELEASE Left 02/11/2019   Procedure: LEFT WRIST STENOSING TENOSYNOVITIS RELEASE;  Surgeon: Charlotte Crumb, MD;  Location: [place];  Service: Orthopedics;  Laterality: Left;  MAC WITH AXILLARY BLOCK  . WRIST ARTHROPLASTY Left 10/17/2017   Procedure: CAPITATE RESURFACING ARTHROPLASTY;  Surgeon: Charlotte Crumb, MD;  Location: [facility];  Service: Orthopedics;  Laterality: Left;    Family History  Problem Relation Age of Onset  . Arthritis Father        died age 18  . GI Bleed Father        upper GI Bleed, non-ETOH cirrhosis  . Arthritis Brother   . Diabetes Brother        type 2  . Diabetes Paternal Uncle        type 2  . Heart attack Maternal Grandmother   . Heart attack Maternal Grandfather   . Colon cancer Neg Hx   . Colon polyps Neg Hx    Social History:  reports that he quit smoking about 45 years ago. His smoking use included cigarettes. He has a 0.75 pack-year smoking history. He has never  used smokeless tobacco. He reports previous alcohol use. He reports that he does not use drugs.  Allergies:  Allergies  Allergen Reactions  . Asa [Aspirin] Shortness Of Breath    "asthma symptoms"  . Penicillins Shortness Of Breath    Has patient had a PCN reaction causing immediate rash, facial/tongue/throat swelling, SOB or lightheadedness with hypotension: Yes Has patient had a PCN reaction causing severe rash involving mucus membranes or skin necrosis: No Has patient had a PCN reaction that required hospitalization No Has patient had a PCN reaction occurring within the last 10 years: No If all of the above answers are "NO", then may proceed with Cephalosporin use. "asthma symptoms" Patient has tolerated cefazolin, ceftriaxo  . Vancomycin Anaphylaxis    Immediately following being turned into prone position and Vancomycin administration in the OR patient cardiac arrest w/  Vfib.    Medications  Prior to Admission  Medication Sig Dispense Refill  . clindamycin (CLEOCIN) 300 MG capsule Take 900 mg by mouth See admin instructions. Take 3 capsules (900 mg) by mouth 1 hour prior to dental procedures.    Marland Kitchen glipiZIDE (GLUCOTROL XL) 2.5 MG 24 hr tablet Take 1 tablet (2.5 mg total) by mouth daily with breakfast. 30 tablet 3  . loperamide (IMODIUM) 2 MG capsule Take 1 capsule (2 mg total) by mouth daily with breakfast.    . pantoprazole (PROTONIX) 40 MG tablet Take 40 mg by mouth daily.       Results for orders placed or performed during the hospital encounter of 02/10/20 (from the past 48 hour(s))  Glucose, capillary     Status: Abnormal   Collection Time: 02/10/20  8:40 AM  Result Value Ref Range   Glucose-Capillary 120 (H) 70 - 99 mg/dL    Comment: Glucose reference range applies only to samples taken after fasting for at least 8 hours.   No results found.  Review of Systems  Blood pressure 132/84, pulse 68, temperature 98.8 F (37.1 C), temperature source Oral, resp. rate 12, SpO2 97 %. Physical Exam Eyes:     General: Scleral icterus present.     Conjunctiva/sclera: Conjunctivae normal.  Cardiovascular:     Rate and Rhythm: Normal rate and regular rhythm.     Heart sounds: Normal heart sounds. No murmur heard.   Pulmonary:     Effort: Pulmonary effort is normal.     Breath sounds: Normal breath sounds.  Abdominal:     Comments: Abdomen is full.  It is soft and nontender.  Spleen tip is easily palpable below the left costal margin.  Liver edge is indistinct.  Musculoskeletal:        General: No swelling.     Cervical back: Neck supple.  Lymphadenopathy:     Cervical: No cervical adenopathy.  Skin:    General: Skin is warm and dry.  Neurological:     General: No focal deficit present.     Mental Status: He is alert.      Assessment/Plan Cirrhosis. Diarrhea with negative stool studies. Esophagogastroduodenoscopy to screen and band esophageal varices. Diagnostic  colonoscopy.   Hildred Laser, MD 02/10/2020, 9:36 AM

## 2020-02-10 NOTE — Discharge Instructions (Signed)
No aspirin or NSAIDs. Resume usual medications. Nadolol 20 mg by mouth daily with meal. Resume usual diet. No driving for 24 hours. Physician will call with biopsy results.    Monitored Anesthesia Care, Care After These instructions provide you with information about caring for yourself after your procedure. Your health care provider may also give you more specific instructions. Your treatment has been planned according to current medical practices, but problems sometimes occur. Call your health care provider if you have any problems or questions after your procedure. What can I expect after the procedure? After your procedure, you may:  Feel sleepy for several hours.  Feel clumsy and have poor balance for several hours.  Feel forgetful about what happened after the procedure.  Have poor judgment for several hours.  Feel nauseous or vomit.  Have a sore throat if you had a breathing tube during the procedure. Follow these instructions at home: For at least 24 hours after the procedure:      Have a responsible adult stay with you. It is important to have someone help care for you until you are awake and alert.  Rest as needed.  Do not: ? Participate in activities in which you could fall or become injured. ? Drive. ? Use heavy machinery. ? Drink alcohol. ? Take sleeping pills or medicines that cause drowsiness. ? Make important decisions or sign legal documents. ? Take care of children on your own. Eating and drinking  Follow the diet that is recommended by your health care provider.  If you vomit, drink water, juice, or soup when you can drink without vomiting.  Make sure you have little or no nausea before eating solid foods. General instructions  Take over-the-counter and prescription medicines only as told by your health care provider.  If you have sleep apnea, surgery and certain medicines can increase your risk for breathing problems. Follow instructions from  your health care provider about wearing your sleep device: ? Anytime you are sleeping, including during daytime naps. ? While taking prescription pain medicines, sleeping medicines, or medicines that make you drowsy.  If you smoke, do not smoke without supervision.  Keep all follow-up visits as told by your health care provider. This is important. Contact a health care provider if:  You keep feeling nauseous or you keep vomiting.  You feel light-headed.  You develop a rash.  You have a fever. Get help right away if:  You have trouble breathing. Summary  For several hours after your procedure, you may feel sleepy and have poor judgment.  Have a responsible adult stay with you for at least 24 hours or until you are awake and alert. This information is not intended to replace advice given to you by your health care provider. Make sure you discuss any questions you have with your health care provider. Document Revised: 09/16/2017 Document Reviewed: 10/09/2015 Elsevier Patient Education  [place].    Colonoscopy, Adult, Care After This sheet gives you information about how to care for yourself after your procedure. Your doctor may also give you more specific instructions. If you have problems or questions, call your doctor. What can I expect after the procedure? After the procedure, it is common to have:  A small amount of blood in your poop (stool) for 24 hours.  Some gas.  Mild cramping or bloating in your belly (abdomen). Follow these instructions at home: Eating and drinking   Drink enough fluid to keep your pee (urine) pale yellow.  Follow instructions from your doctor about what you cannot eat or drink.  Return to your normal diet as told by your doctor. Avoid heavy or fried foods that are hard to digest. Activity  Rest as told by your doctor.  Do not sit for a long time without moving. Get up to take short walks every 1-2 hours. This is important. Ask  for help if you feel weak or unsteady.  Return to your normal activities as told by your doctor. Ask your doctor what activities are safe for you. To help cramping and bloating:   Try walking around.  Put heat on your belly as told by your doctor. Use the heat source that your doctor recommends, such as a moist heat pack or a heating pad. ? Put a towel between your skin and the heat source. ? Leave the heat on for 20-30 minutes. ? Remove the heat if your skin turns bright red. This is very important if you are unable to feel pain, heat, or cold. You may have a greater risk of getting burned. General instructions  For the first 24 hours after the procedure: ? Do not drive or use machinery. ? Do not sign important documents. ? Do not drink alcohol. ? Do your daily activities more slowly than normal. ? Eat foods that are soft and easy to digest.  Take over-the-counter or prescription medicines only as told by your doctor.  Keep all follow-up visits as told by your doctor. This is important. Contact a doctor if:  You have blood in your poop 2-3 days after the procedure. Get help right away if:  You have more than a small amount of blood in your poop.  You see large clumps of tissue (blood clots) in your poop.  Your belly is swollen.  You feel like you may vomit (nauseous).  You vomit.  You have a fever.  You have belly pain that gets worse, and medicine does not help your pain. Summary  After the procedure, it is common to have a small amount of blood in your poop. You may also have mild cramping and bloating in your belly.  For the first 24 hours after the procedure, do not drive or use machinery, do not sign important documents, and do not drink alcohol.  Get help right away if you have a lot of blood in your poop, feel like you may vomit, have a fever, or have more belly pain. This information is not intended to replace advice given to you by your health care provider.  Make sure you discuss any questions you have with your health care provider. Document Revised: 01/12/2019 Document Reviewed: 01/12/2019 Elsevier Patient Education  [place].    Upper Endoscopy, Adult, Care After This sheet gives you information about how to care for yourself after your procedure. Your health care provider may also give you more specific instructions. If you have problems or questions, contact your health care provider. What can I expect after the procedure? After the procedure, it is common to have:  A sore throat.  Mild stomach pain or discomfort.  Bloating.  Nausea. Follow these instructions at home:   Follow instructions from your health care provider about what to eat or drink after your procedure.  Return to your normal activities as told by your health care provider. Ask your health care provider what activities are safe for you.  Take over-the-counter and prescription medicines only as told by your health care provider.  Do  not drive for 24 hours if you were given a sedative during your procedure.  Keep all follow-up visits as told by your health care provider. This is important. Contact a health care provider if you have:  A sore throat that lasts longer than one day.  Trouble swallowing. Get help right away if:  You vomit blood or your vomit looks like coffee grounds.  You have: ? A fever. ? Bloody, black, or tarry stools. ? A severe sore throat or you cannot swallow. ? Difficulty breathing. ? Severe pain in your chest or abdomen. Summary  After the procedure, it is common to have a sore throat, mild stomach discomfort, bloating, and nausea.  Do not drive for 24 hours if you were given a sedative during the procedure.  Follow instructions from your health care provider about what to eat or drink after your procedure.  Return to your normal activities as told by your health care provider. This information is not intended to  replace advice given to you by your health care provider. Make sure you discuss any questions you have with your health care provider. Document Revised: 12/10/2017 Document Reviewed: 11/18/2017 Elsevier Patient Education  [place].

## 2020-02-10 NOTE — Op Note (Addendum)
[facility] Patient Name: Stephen Burch Procedure Date: 02/10/2020 9:18 AM MRN: [id number] Date of Birth: 06-Sep-1955 Attending MD: Lionel December , MD CSN: [id number] Age: 64 Admit Type: Outpatient Procedure:                Upper GI endoscopy Indications:              Cirrhosis with suspected esophageal varices Providers:                Lionel December, MD, Nena Polio, RN, Dyann Ruddle Referring MD:             Demetrios Isaacs. Fisher, MD Medicines:                Propofol per Anesthesia Complications:            No immediate complications. Estimated Blood Loss:     Estimated blood loss: none. Procedure:                Pre-Anesthesia Assessment:                           - Prior to the procedure, a History and Physical                            was performed, and patient medications and                            allergies were reviewed. The patient's tolerance of                            previous anesthesia was also reviewed. The risks                            and benefits of the procedure and the sedation                            options and risks were discussed with the patient.                            All questions were answered, and informed consent                            was obtained. Prior Anticoagulants: The patient has                            taken no previous anticoagulant or antiplatelet                            agents. ASA Grade Assessment: III - A patient with                            severe systemic disease. After reviewing the risks                            and benefits, the patient was deemed in  satisfactory condition to undergo the procedure.                           After obtaining informed consent, the endoscope was                            passed under direct vision. Throughout the                            procedure, the patient's blood pressure, pulse, and                            oxygen saturations were monitored  continuously. The                            [id number]-[id number] ([id number]) scope was introduced through the                            mouth, and advanced to the second part of duodenum.                            The upper GI endoscopy was accomplished without                            difficulty. The patient tolerated the procedure                            well. Scope In: 9:53:31 AM Scope Out: 10:04:39 AM Total Procedure Duration: 0 hours 11 minutes 8 seconds  Findings:      The hypopharynx was normal.      The proximal esophagus and mid esophagus were normal.      Grade I, grade III varices were found in the distal esophagus. Two bands       were successfully placed with incomplete eradication of varices. There       was no bleeding during and at the end of the procedure.      The Z-line was irregular and was found 39 cm from the incisors.      Mild, moderate portal hypertensive gastropathy was found in the entire       examined stomach.      The exam of the stomach was otherwise normal.      The duodenal bulb and second portion of the duodenum were normal. Impression:               - Normal hypopharynx.                           - Normal proximal esophagus and mid esophagus.                           - Grade I and grade III esophageal varices.                            Incompletely eradicated. Banded.Pictures were taken  but lost.                           - Z-line irregular, 39 cm from the incisors.                           - Portal hypertensive gastropathy.                           - Normal duodenal bulb and second portion of the                            duodenum.                           - No specimens collected. Moderate Sedation:      Per Anesthesia Care Recommendation:           - Patient has a contact number available for                            emergencies. The signs and symptoms of potential                            delayed complications  were discussed with the                            patient. Return to normal activities tomorrow.                            Written discharge instructions were provided to the                            patient.                           - Resume previous diet today.                           - Continue present medications.                           - Nadolol 20 mg po qd.                           - Repeat upper endoscopy in 3 months. Procedure Code(s):        --- Professional ---                           [phone], Esophagogastroduodenoscopy, flexible,                            transoral; with band ligation of esophageal/gastric                            varices Diagnosis Code(s):        --- Professional ---  K74.60, Unspecified cirrhosis of liver                           I85.10, Secondary esophageal varices without                            bleeding                           K22.8, Other specified diseases of esophagus                           K76.6, Portal hypertension                           K31.89, Other diseases of stomach and duodenum CPT copyright 2019 American Medical Association. All rights reserved. The codes documented in this report are preliminary and upon coder review may  be revised to meet current compliance requirements. Lionel December, MD Lionel December, MD 02/10/2020 10:42:22 AM This report has been signed electronically. Number of Addenda: 0

## 2020-02-10 NOTE — Anesthesia Postprocedure Evaluation (Signed)
Anesthesia Post Note  Patient: Stephen Burch  Procedure(s) Performed: COLONOSCOPY WITH PROPOFOL (N/A ) ESOPHAGOGASTRODUODENOSCOPY (EGD) WITH PROPOFOL (N/A ) BIOPSY  Patient location during evaluation: PACU Anesthesia Type: General Level of consciousness: awake Pain management: pain level controlled Respiratory status: spontaneous breathing Cardiovascular status: blood pressure returned to baseline Anesthetic complications: no   No complications documented.   Last Vitals:  Vitals:   02/10/20 1100 02/10/20 1111  BP: 109/85 102/72  Pulse: 81 (!) 54  Resp: (!) 25 18  Temp:    SpO2: 97% (!) 16%    Last Pain:  Vitals:   02/10/20 1111  TempSrc:   PainSc: 0-No pain                 Louann Sjogren

## 2020-02-10 NOTE — Transfer of Care (Signed)
Immediate Anesthesia Transfer of Care Note  Patient: Rodman Pickle Germani  Procedure(s) Performed: COLONOSCOPY WITH PROPOFOL (N/A ) ESOPHAGOGASTRODUODENOSCOPY (EGD) WITH PROPOFOL (N/A ) BIOPSY  Patient Location: PACU  Anesthesia Type:General  Level of Consciousness: awake and patient cooperative  Airway & Oxygen Therapy: Patient Spontanous Breathing  Post-op Assessment: Report given to RN and Post -op Vital signs reviewed and stable  Post vital signs: Reviewed and stable  Last Vitals:  Vitals Value Taken Time  BP 121/87 02/10/20 1040  Temp 36.5 C 02/10/20 1040  Pulse 75 02/10/20 1042  Resp 18 02/10/20 1042  SpO2 98 % 02/10/20 1042  Vitals shown include unvalidated device data.  Last Pain:  Vitals:   02/10/20 0846  TempSrc: Oral      Patients Stated Pain Goal: 8 (19/80/22 1798)  Complications: No complications documented.

## 2020-02-10 NOTE — Anesthesia Procedure Notes (Signed)
Date/Time: 02/10/2020 9:46 AM Performed by: Vista Deck, CRNA Pre-anesthesia Checklist: Patient identified, Emergency Drugs available, Suction available, Timeout performed and Patient being monitored Patient Re-evaluated:Patient Re-evaluated prior to induction Oxygen Delivery Method: Nasal Cannula

## 2020-02-11 ENCOUNTER — Encounter: Payer: Self-pay | Admitting: Family Medicine

## 2020-02-11 DIAGNOSIS — Z86010 Personal history of colonic polyps: Secondary | ICD-10-CM | POA: Insufficient documentation

## 2020-02-11 LAB — SURGICAL PATHOLOGY

## 2020-02-15 ENCOUNTER — Encounter (HOSPITAL_COMMUNITY): Payer: Self-pay | Admitting: Internal Medicine

## 2020-02-16 ENCOUNTER — Ambulatory Visit: Payer: PPO | Admitting: Family Medicine

## 2020-02-16 DIAGNOSIS — E0590 Thyrotoxicosis, unspecified without thyrotoxic crisis or storm: Secondary | ICD-10-CM | POA: Diagnosis not present

## 2020-03-01 ENCOUNTER — Encounter: Payer: Self-pay | Admitting: Family Medicine

## 2020-03-01 ENCOUNTER — Other Ambulatory Visit: Payer: Self-pay

## 2020-03-01 ENCOUNTER — Ambulatory Visit (INDEPENDENT_AMBULATORY_CARE_PROVIDER_SITE_OTHER): Payer: PPO | Admitting: Family Medicine

## 2020-03-01 VITALS — BP 117/72 | HR 76 | Temp 98.7°F | Resp 18 | Ht 72.0 in | Wt 206.0 lb

## 2020-03-01 DIAGNOSIS — I700 Atherosclerosis of aorta: Secondary | ICD-10-CM | POA: Diagnosis not present

## 2020-03-01 DIAGNOSIS — K766 Portal hypertension: Secondary | ICD-10-CM

## 2020-03-01 DIAGNOSIS — E1169 Type 2 diabetes mellitus with other specified complication: Secondary | ICD-10-CM

## 2020-03-01 DIAGNOSIS — D61818 Other pancytopenia: Secondary | ICD-10-CM

## 2020-03-01 LAB — POCT GLYCOSYLATED HEMOGLOBIN (HGB A1C)
Est. average glucose Bld gHb Est-mCnc: 134
Hemoglobin A1C: 6.3 % — AB (ref 4.0–5.6)

## 2020-03-01 NOTE — Progress Notes (Signed)
I,Roshena L Chambers,acting as a scribe for Mila Merry, MD.,have documented all relevant documentation on the behalf of Mila Merry, MD,as directed by  Mila Merry, MD while in the presence of Mila Merry, MD.  Established patient visit   Patient: Stephen Burch   DOB: 1955/07/21   64 y.o. Male  MRN: [id number] Visit Date: 03/01/2020  Today's healthcare provider: Mila Merry, MD   Chief Complaint  Patient presents with  . Diabetes   Subjective    HPI  Diabetes Mellitus Type II, Follow-up  Lab Results  Component Value Date   HGBA1C 6.3 (A) 03/01/2020   HGBA1C 6.4 (A) 09/15/2019   HGBA1C 6.4 (A) 06/11/2019   Wt Readings from Last 3 Encounters:  03/01/20 206 lb (93.4 kg)  02/08/20 204 lb (92.5 kg)  01/19/20 204 lb 8 oz (92.8 kg)   Last seen for diabetes 5 months ago.  Management since then includes continue same medication. He reports good compliance with treatment. He is not having side effects.  Symptoms: No fatigue No foot ulcerations  No appetite changes No nausea  No paresthesia of the feet  No polydipsia  No polyuria No visual disturbances   No vomiting     Home blood sugar records: fasting range: 150-160  Episodes of hypoglycemia? No    Current insulin regiment: none Most Recent Eye Exam: 10/16/2019 Current exercise: none Current diet habits: in general, an "unhealthy" diet  Pertinent Labs: Lab Results  Component Value Date   CHOL 120 05/01/2018   HDL 41 05/01/2018   LDLCALC 59 05/01/2018   TRIG 100 05/01/2018   CHOLHDL 2.9 05/01/2018   Lab Results  Component Value Date   NA 140 02/08/2020   K 3.7 02/08/2020   CREATININE 0.93 02/08/2020   GFRNONAA >60 02/08/2020   GFRAA >60 02/08/2020   GLUCOSE 131 (H) 02/08/2020     ---------------------------------------------------------------------------------------------------     Medications: Outpatient Medications Prior to Visit  Medication Sig  . clindamycin (CLEOCIN) 300 MG  capsule Take 900 mg by mouth See admin instructions. Take 3 capsules (900 mg) by mouth 1 hour prior to dental procedures.  Marland Kitchen glipiZIDE (GLUCOTROL XL) 2.5 MG 24 hr tablet Take 1 tablet (2.5 mg total) by mouth daily with breakfast.  . nadolol (CORGARD) 20 MG tablet Take 1 tablet (20 mg total) by mouth daily.  . pantoprazole (PROTONIX) 40 MG tablet Take 40 mg by mouth daily.   . [DISCONTINUED] loperamide (IMODIUM) 2 MG capsule Take 1 capsule (2 mg total) by mouth daily with breakfast. (Patient not taking: Reported on 03/01/2020)   No facility-administered medications prior to visit.    Review of Systems  Constitutional: Negative for appetite change, chills and fever.  Respiratory: Negative for chest tightness, shortness of breath and wheezing.   Cardiovascular: Negative for chest pain and palpitations.  Gastrointestinal: Negative for abdominal pain, nausea and vomiting.    Last CBC Lab Results  Component Value Date   WBC 2.6 (L) 02/08/2020   HGB 14.7 02/08/2020   HCT 42.8 02/08/2020   MCV 89.2 02/08/2020   MCH 30.6 02/08/2020   RDW 13.4 02/08/2020   PLT 71 (L) 02/08/2020    Objective    BP 117/72 (BP Location: Left Arm, Patient Position: Sitting, Cuff Size: Normal)   Pulse 76   Temp 98.7 F (37.1 C) (Oral)   Resp 18   Ht 6' (1.829 m)   Wt 206 lb (93.4 kg)   BMI 27.94 kg/m  Physical Exam   General: Appearance:     Overweight male in no acute distress  Eyes:    PERRL, conjunctiva/corneas clear, EOM's intact       Lungs:     Occasional expiratory wheezes, respirations unlabored  Heart:    Normal heart rate. Normal rhythm. No murmurs, rubs, or gallops.   MS:   All extremities are intact.   Neurologic:   Awake, alert, oriented x 3. No apparent focal neurological           defect.        Results for orders placed or performed in visit on 03/01/20  POCT HgB A1C  Result Value Ref Range   Hemoglobin A1C 6.3 (A) 4.0 - 5.6 %   Est. average glucose Bld gHb Est-mCnc 134      Assessment & Plan     1. Type 2 diabetes mellitus with other specified complication, without long-term current use of insulin (HCC) Very well controlled, Continue current medications.    2. Atherosclerosis of aorta (HCC) Asymptomatic. Compliant with medication.  Continue aggressive risk factor modification.    3. Portal hypertension (HCC) Stable, continue routine follow up with hepatologist.   4. Other pancytopenia (HCC) Secondary to chronic liver disease. Stable on recent labs.   Offered flu vaccine, but he states he wants to discuss with his hepatologist first. He states he did have Covid vaccine in March of this year.   Future Appointments  Date Time Provider Department Center  07/26/2020  8:40 AM Malva Limes, MD [facility]  07/26/2020  1:45 PM Rehman, Joline Maxcy, MD [facility] None  11/17/2020  9:40 AM [facility]-NURSE HEALTH ADVISOR [facility]         The entirety of the information documented in the History of Present Illness, Review of Systems and Physical Exam were personally obtained by me. Portions of this information were initially documented by the CMA and reviewed by me for thoroughness and accuracy.      Mila Merry, MD  [facility] [phone] (phone) [phone] (fax)  [facility]

## 2020-03-01 NOTE — Patient Instructions (Addendum)
   I suggest restarting nadolol but only take 1/2 tablet a day at first, after 3-4 weeks, you should be able to tolerate a full tablet  . I recommend that you get a flu vaccine this year. Please call our office at [phone] at your earliest convenience to schedule a flu shot.

## 2020-03-14 DIAGNOSIS — M19132 Post-traumatic osteoarthritis, left wrist: Secondary | ICD-10-CM | POA: Diagnosis not present

## 2020-03-14 DIAGNOSIS — M25531 Pain in right wrist: Secondary | ICD-10-CM | POA: Diagnosis not present

## 2020-03-21 DIAGNOSIS — L820 Inflamed seborrheic keratosis: Secondary | ICD-10-CM | POA: Diagnosis not present

## 2020-03-21 DIAGNOSIS — D485 Neoplasm of uncertain behavior of skin: Secondary | ICD-10-CM | POA: Diagnosis not present

## 2020-04-28 ENCOUNTER — Encounter (INDEPENDENT_AMBULATORY_CARE_PROVIDER_SITE_OTHER): Payer: Self-pay | Admitting: *Deleted

## 2020-05-16 ENCOUNTER — Encounter: Payer: Self-pay | Admitting: Family Medicine

## 2020-05-16 ENCOUNTER — Other Ambulatory Visit: Payer: Self-pay

## 2020-05-16 ENCOUNTER — Ambulatory Visit (INDEPENDENT_AMBULATORY_CARE_PROVIDER_SITE_OTHER): Payer: PPO | Admitting: Family Medicine

## 2020-05-16 VITALS — BP 108/68 | HR 66 | Temp 98.5°F | Resp 18 | Wt 208.0 lb

## 2020-05-16 DIAGNOSIS — E1169 Type 2 diabetes mellitus with other specified complication: Secondary | ICD-10-CM

## 2020-05-16 DIAGNOSIS — R739 Hyperglycemia, unspecified: Secondary | ICD-10-CM | POA: Diagnosis not present

## 2020-05-16 LAB — POCT GLYCOSYLATED HEMOGLOBIN (HGB A1C)
Est. average glucose Bld gHb Est-mCnc: 134
Hemoglobin A1C: 6.3 % — AB (ref 4.0–5.6)

## 2020-05-16 NOTE — Patient Instructions (Addendum)
.   Please review the attached list of medications and notify my office if there are any errors.   . Continue 2.58m glipizide every morning for now and work on being more strict with your diet and exercising regularly. We'll contact you in 2-3 weeks. If your sugars are still running high at that time will increase glipizide to 549mevery morning  . Covid-19 vaccines: The Covid vaccines have been given to hundreds of millions of people and found to be very effective and are as safe as any other vaccine.  The [employer]accine has been associated with very rare dangerous blood clots, but only in adult women under the age of 66 The risk of dying from Covid infections is much higher than having a serious reaction to the vaccine.  I strongly recommend getting fully vaccinated against Covid-19.  I recommend that adult women under 60 get fully vaccinated, but the MoPillageraccines may be safer for those women than the [employer]accine.

## 2020-05-16 NOTE — Progress Notes (Signed)
Established patient visit   Patient: Stephen Burch   DOB: 04/11/1956   64 y.o. Male  MRN: [id number] Visit Date: 05/16/2020  Today's healthcare provider: Mila Merry, MD   Chief Complaint  Patient presents with  . Diabetes   Subjective    HPI  Diabetes Mellitus Type II, Follow-up  Lab Results  Component Value Date   HGBA1C 6.3 (A) 03/01/2020   HGBA1C 6.4 (A) 09/15/2019   HGBA1C 6.4 (A) 06/11/2019   Wt Readings from Last 3 Encounters:  05/16/20 208 lb (94.3 kg)  03/01/20 206 lb (93.4 kg)  02/08/20 204 lb (92.5 kg)   Last seen for diabetes 03/01/2020 Management since then includes continue same medication. He reports good compliance with treatment. He is having side effects. Blood sugars have been running high. Has noticed them running around 200 fasting and post prandials. Otherwise feels well  Symptoms: No fatigue No foot ulcerations  No appetite changes No nausea  No paresthesia of the feet  No polydipsia  Yes polyuria No visual disturbances   No vomiting     Home blood sugar records: fasting range: 205-245  Episodes of hypoglycemia? No    Current insulin regiment: none Most Recent Eye Exam: not UTD Current exercise: none Current diet habits: in general, an "unhealthy" diet  Pertinent Labs: Lab Results  Component Value Date   CHOL 120 05/01/2018   HDL 41 05/01/2018   LDLCALC 59 05/01/2018   TRIG 100 05/01/2018   CHOLHDL 2.9 05/01/2018   Lab Results  Component Value Date   NA 140 02/08/2020   K 3.7 02/08/2020   CREATININE 0.93 02/08/2020   GFRNONAA >60 02/08/2020   GFRAA >60 02/08/2020   GLUCOSE 131 (H) 02/08/2020     ---------------------------------------------------------------------------------------------------     Medications: Outpatient Medications Prior to Visit  Medication Sig  . clindamycin (CLEOCIN) 300 MG capsule Take 900 mg by mouth See admin instructions. Take 3 capsules (900 mg) by mouth 1 hour prior to dental  procedures.  Marland Kitchen glipiZIDE (GLUCOTROL XL) 2.5 MG 24 hr tablet Take 1 tablet (2.5 mg total) by mouth daily with breakfast.  . nadolol (CORGARD) 20 MG tablet Take 1 tablet (20 mg total) by mouth daily.  . pantoprazole (PROTONIX) 40 MG tablet Take 40 mg by mouth daily.    No facility-administered medications prior to visit.    Review of Systems  Constitutional: Negative for appetite change, chills and fever.  Respiratory: Negative for chest tightness, shortness of breath and wheezing.   Cardiovascular: Negative for chest pain and palpitations.  Gastrointestinal: Negative for abdominal pain, nausea and vomiting.      Objective    BP 108/68 (BP Location: Right Arm, Patient Position: Sitting, Cuff Size: Large)   Pulse 66   Temp 98.5 F (36.9 C) (Oral)   Resp 18   Wt 208 lb (94.3 kg)   BMI 28.21 kg/m    Physical Exam   General appearance:  Well developed, well nourished male, cooperative and in no acute distress Head: Normocephalic, without obvious abnormality, atraumatic Respiratory: Respirations even and unlabored, normal respiratory rate Extremities: All extremities are intact.  Skin: Skin color, texture, turgor normal. No rashes seen  Psych: Appropriate mood and affect. Neurologic: Mental status: Alert, oriented to person, place, and time, thought content appropriate.   Results for orders placed or performed in visit on 05/16/20  POCT HgB A1C  Result Value Ref Range   Hemoglobin A1C 6.3 (A) 4.0 - 5.6 %   Est.  average glucose Bld gHb Est-mCnc 134     Assessment & Plan     1. Hyperglycemia A1c stable, but home sugar unsually elevated for a couple weeks. No s/s of infectious process. He is going to work on exercising more and being more strict with diet. He just had 30 of the 2.5mg  glipizide dispensed. Will check back in with him before next refill is due. If home sugars not better than will increase to 5mg  tablet.   2. Type 2 diabetes mellitus with other specified  complication, without long-term current use of insulin (HCC)       The entirety of the information documented in the History of Present Illness, Review of Systems and Physical Exam were personally obtained by me. Portions of this information were initially documented by the CMA and reviewed by me for thoroughness and accuracy.      Mila Merry, MD  [facility] [phone] (phone) [phone] (fax)  [facility]

## 2020-05-30 ENCOUNTER — Telehealth (INDEPENDENT_AMBULATORY_CARE_PROVIDER_SITE_OTHER): Payer: Self-pay | Admitting: *Deleted

## 2020-05-30 NOTE — Telephone Encounter (Signed)
Patient on recall for 3 month repeat EGD for cirrhosis - he left message to call him to schedule - [phone]

## 2020-05-31 ENCOUNTER — Encounter (INDEPENDENT_AMBULATORY_CARE_PROVIDER_SITE_OTHER): Payer: Self-pay

## 2020-05-31 ENCOUNTER — Other Ambulatory Visit (INDEPENDENT_AMBULATORY_CARE_PROVIDER_SITE_OTHER): Payer: Self-pay

## 2020-05-31 DIAGNOSIS — K7460 Unspecified cirrhosis of liver: Secondary | ICD-10-CM

## 2020-05-31 DIAGNOSIS — K7581 Nonalcoholic steatohepatitis (NASH): Secondary | ICD-10-CM

## 2020-05-31 NOTE — Telephone Encounter (Signed)
noted 

## 2020-06-01 ENCOUNTER — Telehealth: Payer: Self-pay | Admitting: *Deleted

## 2020-06-01 NOTE — Chronic Care Management (AMB) (Signed)
Chronic Care Management   Note  06/01/2020 Name: Stephen Burch MRN: [id number] DOB: May 25, 1956  Stephen Burch is a 64 y.o. year old male who is a primary care patient of Fisher, Demetrios Isaacs, MD. I reached out to Stephen Burch by phone today in response to a referral sent by Stephen Burch's health plan.     Stephen Burch was given information about Chronic Care Management services today including:  1. CCM service includes personalized support from designated clinical staff supervised by his physician, including individualized plan of care and coordination with other care providers 2. 24/7 contact phone numbers for assistance for urgent and routine care needs. 3. Service will only be billed when office clinical staff spend 20 minutes or more in a month to coordinate care. 4. Only one practitioner may furnish and bill the service in a calendar month. 5. The patient may stop CCM services at any time (effective at the end of the month) by phone call to the office staff. 6. The patient will be responsible for cost sharing (co-pay) of up to 20% of the service fee (after annual deductible is met).  Patient did not agree to enrollment in care management services and does not wish to consider at this time.  Follow up plan: Patient declines engagement by the care management team. Appropriate care team members and provider have been notified via electronic communication. The care management team is available to follow up with the patient after provider conversation with the patient regarding recommendation for care management engagement and subsequent re-referral to the care management team.   [facility] Guide, Embedded Care Coordination [facility]  Care Management

## 2020-06-21 ENCOUNTER — Other Ambulatory Visit: Payer: Self-pay | Admitting: Family Medicine

## 2020-06-21 NOTE — Telephone Encounter (Signed)
Requested medication (s) are due for refill today: expired medication  Requested medication (s) are on the active medication list: yes  Last refill:  06/11/2019 #30 3 refills  Future visit scheduled: yes in 1 month  Notes to clinic:  expired medication, do you want to renew Rx?     Requested Prescriptions  Pending Prescriptions Disp Refills   glipiZIDE (GLUCOTROL XL) 2.5 MG 24 hr tablet [Pharmacy Med Name: glipiZIDE ER 2.5 MG Oral Tablet Extended Release 24 Hour] 30 tablet 0    Sig: Take 1 tablet by mouth once daily with breakfast      Endocrinology:  Diabetes - Sulfonylureas Passed - 06/21/2020  1:45 PM      Passed - HBA1C is between 0 and 7.9 and within 180 days    Hemoglobin A1C  Date Value Ref Range Status  05/16/2020 6.3 (A) 4.0 - 5.6 % Final   Hgb A1c MFr Bld  Date Value Ref Range Status  07/22/2018 6.2 (H) <5.7 % of total Hgb Final    Comment:    For someone without known diabetes, a hemoglobin  A1c value between 5.7% and 6.4% is consistent with prediabetes and should be confirmed with a  follow-up test. . For someone with known diabetes, a value <7% indicates that their diabetes is well controlled. A1c targets should be individualized based on duration of diabetes, age, comorbid conditions, and other considerations. . This assay result is consistent with an increased risk of diabetes. . Currently, no consensus exists regarding use of hemoglobin A1c for diagnosis of diabetes for children. [name] - Valid encounter within last 6 months    Recent Outpatient Visits           1 month ago Hyperglycemia   [facility] Birdie Sons, MD   3 months ago Type 2 diabetes mellitus with other specified complication, without long-term current use of insulin [facility])   [facility] Birdie Sons, MD   7 months ago Deltoid tendinitis of left shoulder   [place], Vickki Muff, PA-C   9 months ago Type 2  diabetes mellitus with other specified complication, without long-term current use of insulin [facility])   [facility] Birdie Sons, MD   10 months ago Type 2 diabetes mellitus with other specified complication, without long-term current use of insulin [facility])   [place], Wendee Beavers, PA-C       Future Appointments             In 1 month Fisher, Kirstie Peri, MD [facility], [facility]

## 2020-06-28 DIAGNOSIS — E1165 Type 2 diabetes mellitus with hyperglycemia: Secondary | ICD-10-CM | POA: Diagnosis not present

## 2020-06-29 ENCOUNTER — Other Ambulatory Visit (HOSPITAL_COMMUNITY)
Admission: RE | Admit: 2020-06-29 | Discharge: 2020-06-29 | Disposition: A | Discharge status: Home / Self Care | Payer: PPO | Source: Ambulatory Visit | Attending: Internal Medicine | Admitting: Internal Medicine

## 2020-06-29 ENCOUNTER — Other Ambulatory Visit: Payer: Self-pay

## 2020-06-29 DIAGNOSIS — Z20822 Contact with and (suspected) exposure to covid-19: Secondary | ICD-10-CM | POA: Insufficient documentation

## 2020-06-29 DIAGNOSIS — Z01812 Encounter for preprocedural laboratory examination: Secondary | ICD-10-CM | POA: Insufficient documentation

## 2020-06-29 LAB — SARS CORONAVIRUS 2 (TAT 6-24 HRS): SARS Coronavirus 2: NEGATIVE

## 2020-06-30 ENCOUNTER — Ambulatory Visit (HOSPITAL_COMMUNITY)
Admission: RE | Admit: 2020-06-30 | Discharge: 2020-06-30 | Disposition: A | Discharge status: Home / Self Care | Payer: PPO | Attending: Internal Medicine | Admitting: Internal Medicine

## 2020-06-30 ENCOUNTER — Encounter (HOSPITAL_COMMUNITY)
Admission: RE | Disposition: A | Discharge status: Home / Self Care | Payer: Self-pay | Source: Home / Self Care | Attending: Internal Medicine

## 2020-06-30 ENCOUNTER — Encounter (HOSPITAL_COMMUNITY): Payer: Self-pay | Admitting: Internal Medicine

## 2020-06-30 ENCOUNTER — Other Ambulatory Visit: Payer: Self-pay

## 2020-06-30 DIAGNOSIS — K2270 Barrett's esophagus without dysplasia: Secondary | ICD-10-CM

## 2020-06-30 DIAGNOSIS — I8500 Esophageal varices without bleeding: Secondary | ICD-10-CM | POA: Insufficient documentation

## 2020-06-30 DIAGNOSIS — Z87891 Personal history of nicotine dependence: Secondary | ICD-10-CM | POA: Diagnosis not present

## 2020-06-30 DIAGNOSIS — K317 Polyp of stomach and duodenum: Secondary | ICD-10-CM

## 2020-06-30 DIAGNOSIS — K2289 Other specified disease of esophagus: Secondary | ICD-10-CM

## 2020-06-30 DIAGNOSIS — R12 Heartburn: Secondary | ICD-10-CM | POA: Insufficient documentation

## 2020-06-30 DIAGNOSIS — K3189 Other diseases of stomach and duodenum: Secondary | ICD-10-CM | POA: Insufficient documentation

## 2020-06-30 DIAGNOSIS — Z7984 Long term (current) use of oral hypoglycemic drugs: Secondary | ICD-10-CM | POA: Insufficient documentation

## 2020-06-30 DIAGNOSIS — Z96653 Presence of artificial knee joint, bilateral: Secondary | ICD-10-CM | POA: Insufficient documentation

## 2020-06-30 DIAGNOSIS — Z86718 Personal history of other venous thrombosis and embolism: Secondary | ICD-10-CM | POA: Diagnosis not present

## 2020-06-30 DIAGNOSIS — K7581 Nonalcoholic steatohepatitis (NASH): Secondary | ICD-10-CM | POA: Diagnosis not present

## 2020-06-30 DIAGNOSIS — K766 Portal hypertension: Secondary | ICD-10-CM

## 2020-06-30 DIAGNOSIS — Z8674 Personal history of sudden cardiac arrest: Secondary | ICD-10-CM | POA: Diagnosis not present

## 2020-06-30 DIAGNOSIS — K7460 Unspecified cirrhosis of liver: Secondary | ICD-10-CM | POA: Insufficient documentation

## 2020-06-30 HISTORY — PX: ESOPHAGEAL BANDING: SHX5518

## 2020-06-30 HISTORY — PX: ESOPHAGOGASTRODUODENOSCOPY: SHX5428

## 2020-06-30 LAB — GLUCOSE, CAPILLARY: Glucose-Capillary: 137 mg/dL — ABNORMAL HIGH (ref 70–99)

## 2020-06-30 SURGERY — EGD (ESOPHAGOGASTRODUODENOSCOPY)
Anesthesia: Moderate Sedation

## 2020-06-30 MED ORDER — LIDOCAINE VISCOUS HCL 2 % MT SOLN
OROMUCOSAL | Status: DC | PRN
  Administered 2020-06-30: 5 mL via OROMUCOSAL

## 2020-06-30 MED ORDER — MIDAZOLAM HCL 5 MG/5ML IJ SOLN
INTRAMUSCULAR | Status: DC | PRN
  Administered 2020-06-30 (×2): 2 mg via INTRAVENOUS
  Administered 2020-06-30: 1 mg via INTRAVENOUS

## 2020-06-30 MED ORDER — LIDOCAINE VISCOUS HCL 2 % MT SOLN
OROMUCOSAL | Status: AC
  Filled 2020-06-30: qty 15

## 2020-06-30 MED ORDER — MIDAZOLAM HCL 5 MG/5ML IJ SOLN
INTRAMUSCULAR | Status: AC
  Filled 2020-06-30: qty 10

## 2020-06-30 MED ORDER — SODIUM CHLORIDE 0.9 % IV SOLN
INTRAVENOUS | Status: DC
  Administered 2020-06-30: 11:00:00 1000 mL via INTRAVENOUS

## 2020-06-30 MED ORDER — MEPERIDINE HCL 50 MG/ML IJ SOLN
INTRAMUSCULAR | Status: AC
  Filled 2020-06-30: qty 1

## 2020-06-30 MED ORDER — STERILE WATER FOR IRRIGATION IR SOLN
Status: DC | PRN
  Administered 2020-06-30: 12:00:00 1.5 mL

## 2020-06-30 MED ORDER — MEPERIDINE HCL 50 MG/ML IJ SOLN
INTRAMUSCULAR | Status: DC | PRN
  Administered 2020-06-30 (×2): 25 mg via INTRAVENOUS

## 2020-06-30 NOTE — Discharge Instructions (Signed)
Resume usual diet and medications as before No driving for 24 hours. Office visit next month as scheduled. Next exam would be in 6 months.   Upper Endoscopy, Adult, Care After This sheet gives you information about how to care for yourself after your procedure. Your health care provider may also give you more specific instructions. If you have problems or questions, contact your health care provider. What can I expect after the procedure? After the procedure, it is common to have:  A sore throat.  Mild stomach pain or discomfort.  Bloating.  Nausea. Follow these instructions at home:   Follow instructions from your health care provider about what to eat or drink after your procedure.  Return to your normal activities as told by your health care provider. Ask your health care provider what activities are safe for you.  Take over-the-counter and prescription medicines only as told by your health care provider.  Do not drive for 24 hours if you were given a sedative during your procedure.  Keep all follow-up visits as told by your health care provider. This is important. Contact a health care provider if you have:  A sore throat that lasts longer than one day.  Trouble swallowing. Get help right away if:  You vomit blood or your vomit looks like coffee grounds.  You have: ? A fever. ? Bloody, black, or tarry stools. ? A severe sore throat or you cannot swallow. ? Difficulty breathing. ? Severe pain in your chest or abdomen. Summary  After the procedure, it is common to have a sore throat, mild stomach discomfort, bloating, and nausea.  Do not drive for 24 hours if you were given a sedative during the procedure.  Follow instructions from your health care provider about what to eat or drink after your procedure.  Return to your normal activities as told by your health care provider. This information is not intended to replace advice given to you by your health care  provider. Make sure you discuss any questions you have with your health care provider. Document Revised: 12/10/2017 Document Reviewed: 11/18/2017 Elsevier Patient Education  [place].

## 2020-06-30 NOTE — H&P (Signed)
Stephen Burch is an 64 y.o. male.   Chief Complaint: Patient is here for esophagogastroduodenoscopy with esophageal variceal banding. HPI: Patient 64 year old Caucasian male who has cirrhosis secondary to NASH complicated by esophageal varices.  He underwent esophageal variceal banding in August this year.  He is returning for repeat evaluation.  He has never bled from his varices.  Heartburn is well controlled with PPI.  He has short segment Barrett's as well.  He denies abdominal pain melena or rectal bleeding.  Patient states he is now working 2 days a week.  He is tries to walk almost daily.  He has lost weight from 220 pounds down to 207 pounds  Past Medical History:  Diagnosis Date  . Acute systolic (congestive) heart failure ([place]) 04/30/2018  . Arthritis    knees, wrists  . Barrett's esophagus   . Cardiac arrest ([place]) 02/22/2016  . Complication of anesthesia    02-22-2016 intraop cardiac arrest immediately after vancomyocin administration , pt cardiac arrest w/ Vfib,  please refer to anesthesia record in epic  . Dysrhythmia    Afib 12/2018 - converted to NSR  . Esophageal varices determined by endoscopy ([place]) 11/2016   grade 1  . GERD (gastroesophageal reflux disease)   . Hiatal hernia   . History of adenomatous polyp of colon   . History of asthma    as child  . History of DVT (deep vein thrombosis)    right lower extremitty post-op right total knee surgery 09/ 2010,  treated w/ coumadin  . History of esophageal dilatation 07/1998   for schatzski ring  . History of kidney stones   . History of staph infection 04/2016   MSSA infection of right total knee w/ sepsis  . Hx of cardiac arrest    02-22-2016  intraoperative, immediately following moving pt into prone position and vancomyocin administration, cardiac arrest w/ Vfib (referred to anesthesia record in epic) pt extubated himself next day  . Infection of prosthetic right knee joint ([place])   . Liver cirrhosis secondary to NASH  (nonalcoholic steatohepatitis) ([place])    NAFLD-- followed by dr Laural Golden---  liver bx 09-30-2012  mild portal and focal sinusoidal fibrosis  . Pyogenic arthritis of right knee joint ([place]) 04/02/2016  . Scapholunate advanced collapse of left wrist    deformity  . Shock circulatory ([place])   . Staphylococcus aureus bacteremia with sepsis ([place])   . Thrombocytopenia ([place])    10-09-2017 per pt his platelets have always been low , followed by pcp, never been referred to hematologist    Past Surgical History:  Procedure Laterality Date  . APPLICATION OF WOUND VAC Right 02/22/2016   Procedure: APPLICATION OF WOUND VAC;  Surgeon: Vickey Huger, MD;  Location: [place];  Service: Orthopedics;  Laterality: Right;  . BIOPSY  02/10/2020   Procedure: BIOPSY;  Surgeon: Rogene Houston, MD;  [facility];  Service: Endoscopy;;  . CARDIAC CATHETERIZATION N/A 02/24/2016   Procedure: Left Heart Cath and Coronary Angiography;  Surgeon: Burnell Blanks, MD;  Location: [place] [facility];  Service: Cardiovascular;  Laterality: N/A;  No angiographic evidence of CAD,  normal LVSF, ef 50-55%  . CARPECTOMY Left 10/17/2017   Procedure: LEFT WRIST PROXIMAL ROW CARPECTOMY WITH POSTEROR IMBROSSEOUS NERVE EXCISION;  Surgeon: Charlotte Crumb, MD;  Location: [place];  Service: Orthopedics;  Laterality: Left;  AXILLARY BLOCK  . CARPECTOMY WITH RADIAL STYLOIDECTOMY Left 02/11/2019   Procedure: LEFT WRIST RADIAL STYLOIDECTOMY;  Surgeon: Burney Gauze,  Rodman Key, MD;  Location: [place];  Service: Orthopedics;  Laterality: Left;  . COLONOSCOPY    . COLONOSCOPY WITH PROPOFOL N/A 02/10/2020   Procedure: COLONOSCOPY WITH PROPOFOL;  Surgeon: Rogene Houston, MD;  [facility];  Service: Endoscopy;  Laterality: N/A;  955  . EAR CYST EXCISION Right 09/06/2014   Procedure: OPEN EXCISION BAKER'S CYST RIGHT KNEE;  Surgeon: Vickey Huger, MD;  Location: [place];  Service: Orthopedics;  Laterality: Right;  .  ESOPHAGEAL DILATION  1996/ 2000  . ESOPHAGOGASTRODUODENOSCOPY N/A 09/17/2012   Procedure: ESOPHAGOGASTRODUODENOSCOPY (EGD);  Surgeon: Rogene Houston, MD;  [facility];  Service: Endoscopy;  Laterality: N/A;  200  . ESOPHAGOGASTRODUODENOSCOPY N/A 12/15/2015   Procedure: ESOPHAGOGASTRODUODENOSCOPY (EGD);  Surgeon: Rogene Houston, MD;  [facility];  Service: Endoscopy;  Laterality: N/A;  1245  . ESOPHAGOGASTRODUODENOSCOPY N/A 12/17/2016   Procedure: ESOPHAGOGASTRODUODENOSCOPY (EGD);  Surgeon: Rogene Houston, MD;  [facility];  Service: Endoscopy;  Laterality: N/A;  210  . ESOPHAGOGASTRODUODENOSCOPY (EGD) WITH PROPOFOL N/A 02/10/2020   Procedure: ESOPHAGOGASTRODUODENOSCOPY (EGD) WITH PROPOFOL;  Surgeon: Rogene Houston, MD;  [facility];  Service: Endoscopy;  Laterality: N/A;  . I & D KNEE WITH POLY EXCHANGE Right 04/02/2016   Procedure: IRRIGATION AND DEBRIDEMENT RIGHT KNEE WITH POLY EXCHANGE;  Surgeon: Vickey Huger, MD;  Location: [place];  Service: Orthopedics;  Laterality: Right;  . INCISION AND DRAINAGE HEMATOMA POST LEFT  TOTAL KNEE  2012  . INGUINAL HERNIA REPAIR Bilateral 05/22/2013   Procedure: REPAIR OF RECURRENT INCARCERATED INGUINAL HERNIA WITH MESH RIGHT SIDE,  REPAIR OF RECURRENT INGUINAL HERNIA WITH MESH LEFT SIDE;  Surgeon: [name], MD;  Location: [place];  Service: General;  Laterality: Bilateral;  . INGUINAL HERNIA REPAIR Bilateral 11/1997  . INSERTION OF MESH Bilateral 05/22/2013   Procedure: INSERTION OF MESH;  Surgeon: [name], MD;  Location: [place];  Service: General;  Laterality: Bilateral;  . IRRIGATION AND DEBRIDEMENT KNEE Right 02/22/2016   Procedure: IRRIGATION AND DEBRIDEMENT KNEE;  Surgeon: Vickey Huger, MD;  Location: [place];  Service: Orthopedics;  Laterality: Right;  . Irrigation and Debridement right knee  03/23/2009   Dr. Sabra Heck, [facility]  . KNEE ARTHROSCOPY W/ SYNOVECTOMY Right 01/30/2010   AND DEBRIDEMENT OF  HETEROTOPIC  . LIVER BIOPSY  09/30/2012  . REVISION TOTAL KNEE ARTHROPLASTY Left fall 2012  . TONSILLECTOMY  child  . TOTAL KNEE ARTHROPLASTY Bilateral right 09/ 2010/  left 09-18-2010  . TRANSTHORACIC ECHOCARDIOGRAM  02/22/2016   ef 30-35% (per cardiac cath 02-24-2016 normal), severe hypokinesis of the mid-apicalanteroseptal and apical myocardium,  grade 1 diastolic dysfunction/ trivial PR and TR  . TRIGGER FINGER RELEASE Left 02/11/2019   Procedure: LEFT WRIST STENOSING TENOSYNOVITIS RELEASE;  Surgeon: Charlotte Crumb, MD;  Location: [place];  Service: Orthopedics;  Laterality: Left;  MAC WITH AXILLARY BLOCK  . WRIST ARTHROPLASTY Left 10/17/2017   Procedure: CAPITATE RESURFACING ARTHROPLASTY;  Surgeon: Charlotte Crumb, MD;  Location: [facility];  Service: Orthopedics;  Laterality: Left;    Family History  Problem Relation Age of Onset  . Arthritis Father        died age 33  . GI Bleed Father        upper GI Bleed, non-ETOH cirrhosis  . Arthritis Brother   . Diabetes Brother        type 2  . Diabetes Paternal Uncle        type 2  .  Heart attack Maternal Grandmother   . Heart attack Maternal Grandfather   . Colon cancer Neg Hx   . Colon polyps Neg Hx    Social History:  reports that he quit smoking about 45 years ago. His smoking use included cigarettes. He has a 0.75 pack-year smoking history. He has never used smokeless tobacco. He reports previous alcohol use. He reports that he does not use drugs.  Allergies:  Allergies  Allergen Reactions  . Asa [Aspirin] Shortness Of Breath    "asthma symptoms"  . Penicillins Shortness Of Breath    Has patient had a PCN reaction causing immediate rash, facial/tongue/throat swelling, SOB or lightheadedness with hypotension: Yes Has patient had a PCN reaction causing severe rash involving mucus membranes or skin necrosis: No Has patient had a PCN reaction that required hospitalization No Has patient had a PCN reaction  occurring within the last 10 years: No If all of the above answers are "NO", then may proceed with Cephalosporin use. "asthma symptoms" Patient has tolerated cefazolin, ceftriaxo  . Vancomycin Anaphylaxis    Immediately following being turned into prone position and Vancomycin administration in the OR patient cardiac arrest w/  Vfib.    Medications Prior to Admission  Medication Sig Dispense Refill  . glipiZIDE (GLUCOTROL XL) 2.5 MG 24 hr tablet Take 1 tablet by mouth once daily with breakfast (Patient taking differently: Take 2.5 mg by mouth daily with breakfast.) 30 tablet 0  . nadolol (CORGARD) 20 MG tablet Take 1 tablet (20 mg total) by mouth daily. (Patient taking differently: Take 10 mg by mouth daily.) 30 tablet 11  . pantoprazole (PROTONIX) 40 MG tablet Take 40 mg by mouth daily.     Marland Kitchen tetrahydrozoline 0.05 % ophthalmic solution Place 1-2 drops into both eyes 3 (three) times daily as needed (dry/irritated eyes.).    Marland Kitchen clindamycin (CLEOCIN) 300 MG capsule Take 600 mg by mouth See admin instructions. Take 2 capsules (600 mg) by mouth 1 hour prior to dental procedures.      Results for orders placed or performed during the hospital encounter of 06/30/20 (from the past 48 hour(s))  Glucose, capillary     Status: Abnormal   Collection Time: 06/30/20 10:48 AM  Result Value Ref Range   Glucose-Capillary 137 (H) 70 - 99 mg/dL    Comment: Glucose reference range applies only to samples taken after fasting for at least 8 hours.   No results found.  Review of Systems  Blood pressure 138/84, pulse 63, temperature 98.5 F (36.9 C), temperature source Oral, resp. rate 13, height 6' (1.829 m), weight 93.9 kg, SpO2 99 %. Physical Exam HENT:     Mouth/Throat:     Mouth: Mucous membranes are moist.     Pharynx: Oropharynx is clear.  Eyes:     General: No scleral icterus.    Conjunctiva/sclera: Conjunctivae normal.  Cardiovascular:     Rate and Rhythm: Normal rate and regular rhythm.      Heart sounds: Normal heart sounds. No murmur heard.   Pulmonary:     Effort: Pulmonary effort is normal.     Breath sounds: Normal breath sounds.  Abdominal:     Comments: Abdomen is symmetrical.  He has periumbilical as well as inguinal scars.  On palpation abdomen is soft.  Spleen tip is palpable.  Liver is indistinct.  Musculoskeletal:        General: No swelling.     Cervical back: Neck supple.  Lymphadenopathy:     Cervical:  No cervical adenopathy.  Skin:    General: Skin is warm and dry.  Neurological:     Mental Status: He is alert.      Assessment/Plan Cirrhosis secondary to NASH complicated by esophageal varices Esophagogastroduodenoscopy with esophageal variceal banding for primary prophylaxis.  Hildred Laser, MD 06/30/2020, 11:38 AM

## 2020-06-30 NOTE — Op Note (Signed)
[facility] Patient Name: Stephen Burch Procedure Date: 06/30/2020 11:15 AM MRN: [id number] Date of Birth: [birth year] Attending MD: Lionel December , MD CSN: [id number] Age: 64 Admit Type: Outpatient Procedure:                Upper GI endoscopy Indications:              Esophageal varices, For therapy of esophageal                            varices Providers:                Lionel December, MD, Loma Messing B. Patsy Lager, RN, Lafonda Mosses, Technician, Cyril Mourning,                            Technician Referring MD:             Demetrios Isaacs. Fisher, MD Medicines:                Lidocaine spray, Meperidine 50 mg IV, Midazolam 5                            mg IV Complications:            No immediate complications. Estimated Blood Loss:     Estimated blood loss: none. Procedure:                Pre-Anesthesia Assessment:                           - Prior to the procedure, a History and Physical                            was performed, and patient medications and                            allergies were reviewed. The patient's tolerance of                            previous anesthesia was also reviewed. The risks                            and benefits of the procedure and the sedation                            options and risks were discussed with the patient.                            All questions were answered, and informed consent                            was obtained. Prior Anticoagulants: The patient has  taken no previous anticoagulant or antiplatelet                            agents. ASA Grade Assessment: III - A patient with                            severe systemic disease. After reviewing the risks                            and benefits, the patient was deemed in                            satisfactory condition to undergo the procedure.                           After obtaining informed consent, the  endoscope was                            passed under direct vision. Throughout the                            procedure, the patient's blood pressure, pulse, and                            oxygen saturations were monitored continuously. The                            GIF-[id number] ([id number]) was introduced through the                            mouth, and advanced to the second part of duodenum.                            The upper GI endoscopy was accomplished without                            difficulty. The patient tolerated the procedure                            well. Scope In: 11:50:55 AM Scope Out: 12:07:11 PM Total Procedure Duration: 0 hours 16 minutes 16 seconds  Findings:      The hypopharynx was normal.      The proximal esophagus and mid esophagus were normal.      Grade I, grade III varices were found in the distal esophagus. One band       was successfully placed with complete eradication, resulting in       deflation of varices. There was no bleeding during and at the end of the       procedure.      There were esophageal mucosal changes secondary to established       short-segment Barrett's disease present in the distal esophagus. The       maximum longitudinal extent of these mucosal changes was 1 cm in length.      The Z-line was irregular and was found 40  cm from the incisors.      Moderate portal hypertensive gastropathy was found in the entire       examined stomach.      A single 6 mm pedunculated polyp with no stigmata of recent bleeding was       found in the gastric body. This polyp was not removed.      The duodenal bulb and second portion of the duodenum were normal. Impression:               - Normal hypopharynx.                           - Normal proximal esophagus and mid esophagus.                           - Grade I and grade III esophageal varices at                            distal esophagus. Only one varix was large enough                             to be banded and it was completely eradicated.                            Banded.                           - Esophageal mucosal changes secondary to                            established short-segment Barrett's disease.                           - Z-line irregular, 40 cm from the incisors.                           - Portal hypertensive gastropathy.                           - A single gastric polyp.                           - Normal duodenal bulb and second portion of the                            duodenum.                           - No specimens collected. Moderate Sedation:      Moderate (conscious) sedation was administered by the endoscopy nurse       and supervised by the endoscopist. The following parameters were       monitored: oxygen saturation, heart rate, blood pressure, CO2       capnography and response to care. Total physician intraservice time was       22 minutes.      Per Anesthesia Care Recommendation:           -  Patient has a contact number available for                            emergencies. The signs and symptoms of potential                            delayed complications were discussed with the                            patient. Return to normal activities tomorrow.                            Written discharge instructions were provided to the                            patient.                           - Resume previous diet today.                           - Continue present medications.                           - No aspirin, ibuprofen, naproxen, or other                            non-steroidal anti-inflammatory drugs.                           - Repeat upper endoscopy in 6 months. Procedure Code(s):        --- Professional ---                           [phone], Esophagogastroduodenoscopy, flexible,                            transoral; with band ligation of esophageal/gastric                            varices                           G0500,  Moderate sedation services provided by the                            same physician or other qualified health care                            professional performing a gastrointestinal                            endoscopic service that sedation supports,                            requiring the presence of an independent trained  observer to assist in the monitoring of the                            patient's level of consciousness and physiological                            status; initial 15 minutes of intra-service time;                            patient age 43 years or older (additional time may                            be reported with 16109, as appropriate) Diagnosis Code(s):        --- Professional ---                           K22.70, Barrett's esophagus without dysplasia                           I85.00, Esophageal varices without bleeding                           K22.8, Other specified diseases of esophagus                           K76.6, Portal hypertension                           K31.89, Other diseases of stomach and duodenum                           K31.7, Polyp of stomach and duodenum CPT copyright 2019 [employer]. All rights reserved. The codes documented in this report are preliminary and upon coder review may  be revised to meet current compliance requirements. Lionel December, MD Lionel December, MD 06/30/2020 12:20:25 PM This report has been signed electronically. Number of Addenda: 0

## 2020-07-06 ENCOUNTER — Encounter (HOSPITAL_COMMUNITY): Payer: Self-pay | Admitting: Internal Medicine

## 2020-07-12 ENCOUNTER — Ambulatory Visit (INDEPENDENT_AMBULATORY_CARE_PROVIDER_SITE_OTHER): Payer: PPO | Admitting: Family Medicine

## 2020-07-12 ENCOUNTER — Encounter: Payer: Self-pay | Admitting: Family Medicine

## 2020-07-12 ENCOUNTER — Other Ambulatory Visit: Payer: Self-pay

## 2020-07-12 VITALS — BP 118/70 | HR 76 | Temp 98.4°F | Resp 16 | Wt 204.0 lb

## 2020-07-12 DIAGNOSIS — S39012A Strain of muscle, fascia and tendon of lower back, initial encounter: Secondary | ICD-10-CM | POA: Diagnosis not present

## 2020-07-12 MED ORDER — METHOCARBAMOL 750 MG PO TABS: 750.0000 mg | ORAL_TABLET | Freq: Three times a day (TID) | ORAL | 0 refills | Status: DC | PRN

## 2020-07-12 NOTE — Progress Notes (Signed)
Established patient visit   Patient: Stephen Burch   DOB: 05-08-1956   65 y.o. Male  MRN: [id number] Visit Date: 07/12/2020  Today's healthcare provider: Mila Merry, MD   Chief Complaint  Patient presents with  . Back Pain   Subjective    Back Pain The current episode started in the past 7 days. The problem has been gradually worsening since onset. The pain is present in the lumbar spine (left side). The quality of the pain is described as aching and shooting. The pain does not radiate. The pain is moderate. The symptoms are aggravated by twisting. Stiffness is present all day. Pertinent negatives include no bladder incontinence, bowel incontinence, fever or headaches. He has tried NSAIDs for the symptoms. The treatment provided mild relief.    Has had sinus drainage and cough for 4-5 days, then starting having pain about 2 days. Is below shoulder blade. Tylenol didn't help.      Medications: Outpatient Medications Prior to Visit  Medication Sig  . glipiZIDE (GLUCOTROL XL) 2.5 MG 24 hr tablet Take 1 tablet by mouth once daily with breakfast (Patient taking differently: Take 2.5 mg by mouth daily with breakfast.)  . nadolol (CORGARD) 20 MG tablet Take 1 tablet (20 mg total) by mouth daily. (Patient taking differently: Take 10 mg by mouth daily.)  . pantoprazole (PROTONIX) 40 MG tablet Take 40 mg by mouth daily.   Marland Kitchen tetrahydrozoline 0.05 % ophthalmic solution Place 1-2 drops into both eyes 3 (three) times daily as needed (dry/irritated eyes.).  Marland Kitchen clindamycin (CLEOCIN) 300 MG capsule Take 600 mg by mouth See admin instructions. Take 2 capsules (600 mg) by mouth 1 hour prior to dental procedures.   No facility-administered medications prior to visit.    Review of Systems  Constitutional: Negative for fever.  Gastrointestinal: Negative for bowel incontinence.  Genitourinary: Negative for bladder incontinence.  Musculoskeletal: Positive for back pain.  Neurological: Negative  for headaches.      Objective    BP 118/70   Pulse 76   Temp 98.4 F (36.9 C)   Resp 16   Wt 204 lb (92.5 kg)   BMI 27.67 kg/m    Physical Exam    General: Appearance:     Overweight male in no acute distress  Eyes:    PERRL, conjunctiva/corneas clear, EOM's intact       Lungs:     Clear to auscultation bilaterally, respirations unlabored  Heart:    Normal heart rate. Normal rhythm. No murmurs, rubs, or gallops.   MS:   Tender left subscapular muscles, minimally swollen, no erythema or other gross deformities. FROM of neck and shoulders.   Neurologic:   Awake, alert, oriented x 3. No apparent focal neurological           defect.         Assessment & Plan     1. Back strain, initial encounter  - methocarbamol (ROBAXIN-750) 750 MG tablet; Take 1-2 tablets (750-1,500 mg total) by mouth every 8 (eight) hours as needed for muscle spasms.  Dispense: 30 tablet; Refill: 0   Apply on ice every 4-6 hours for the next two days, may then alternated heat and ice.    Call if symptoms change or if not rapidly improving.        The entirety of the information documented in the History of Present Illness, Review of Systems and Physical Exam were personally obtained by me. Portions of this information were  initially documented by the CMA and reviewed by me for thoroughness and accuracy.      Mila Merry, MD  [facility] [phone] (phone) [phone] (fax)  [facility]

## 2020-07-26 ENCOUNTER — Ambulatory Visit (INDEPENDENT_AMBULATORY_CARE_PROVIDER_SITE_OTHER): Payer: PPO | Admitting: Internal Medicine

## 2020-07-26 ENCOUNTER — Ambulatory Visit
Admission: RE | Admit: 2020-07-26 | Discharge: 2020-07-26 | Disposition: A | Discharge status: Home / Self Care | Payer: PPO | Attending: Family Medicine | Admitting: Family Medicine

## 2020-07-26 ENCOUNTER — Encounter: Payer: Self-pay | Admitting: Family Medicine

## 2020-07-26 ENCOUNTER — Ambulatory Visit
Admission: RE | Admit: 2020-07-26 | Discharge: 2020-07-26 | Disposition: A | Discharge status: Home / Self Care | Payer: PPO | Source: Ambulatory Visit | Attending: Family Medicine | Admitting: Family Medicine

## 2020-07-26 ENCOUNTER — Other Ambulatory Visit: Payer: Self-pay

## 2020-07-26 ENCOUNTER — Ambulatory Visit (INDEPENDENT_AMBULATORY_CARE_PROVIDER_SITE_OTHER): Payer: PPO | Admitting: Family Medicine

## 2020-07-26 VITALS — BP 124/72 | HR 70 | Temp 98.0°F | Resp 16 | Ht 72.0 in | Wt 203.0 lb

## 2020-07-26 DIAGNOSIS — R0781 Pleurodynia: Secondary | ICD-10-CM | POA: Insufficient documentation

## 2020-07-26 MED ORDER — TRAMADOL HCL 50 MG PO TABS: 50.0000 mg | ORAL_TABLET | Freq: Four times a day (QID) | ORAL | 0 refills | Status: DC | PRN

## 2020-07-26 NOTE — Progress Notes (Signed)
Established patient visit   Patient: Stephen Burch   DOB: 11-25-1955   65 y.o. Male  MRN: [id number] Visit Date: 07/26/2020  Today's healthcare provider: Mila Merry, MD   Chief Complaint  Patient presents with  . Back Pain   Subjective    Back Pain This is a new problem. The current episode started 1 to 4 weeks ago (2 weeks). The pain is present in the lumbar spine. The quality of the pain is described as aching and shooting. The pain is moderate (can be severe). The pain is the same all the time. The symptoms are aggravated by standing, twisting and bending.    Patient reports that is back pain initially came from coughing excessively due to having covid about 3 weeks ago. He reports that he is now having trouble doing daily tasks at work. Was previously prescribed muscle relaxants which only helped briefly, but overall is not at all improved from three weeks ago.    Medications: Outpatient Medications Prior to Visit  Medication Sig  . glipiZIDE (GLUCOTROL XL) 2.5 MG 24 hr tablet Take 1 tablet by mouth once daily with breakfast (Patient taking differently: Take 2.5 mg by mouth daily with breakfast.)  . methocarbamol (ROBAXIN-750) 750 MG tablet Take 1-2 tablets (750-1,500 mg total) by mouth every 8 (eight) hours as needed for muscle spasms.  . nadolol (CORGARD) 20 MG tablet Take 1 tablet (20 mg total) by mouth daily. (Patient taking differently: Take 10 mg by mouth daily.)  . pantoprazole (PROTONIX) 40 MG tablet Take 40 mg by mouth daily.   Marland Kitchen tetrahydrozoline 0.05 % ophthalmic solution Place 1-2 drops into both eyes 3 (three) times daily as needed (dry/irritated eyes.).  Marland Kitchen clindamycin (CLEOCIN) 300 MG capsule Take 600 mg by mouth See admin instructions. Take 2 capsules (600 mg) by mouth 1 hour prior to dental procedures. (Patient not taking: Reported on 07/26/2020)  . metFORMIN (GLUCOPHAGE) 500 MG tablet Take 1,000 mg by mouth 2 (two) times daily with a meal. (Patient not taking:  No sig reported)   No facility-administered medications prior to visit.    Review of Systems  Constitutional: Negative.   Respiratory: Negative.   Cardiovascular: Negative.   Gastrointestinal: Negative.   Endocrine: Negative.   Musculoskeletal: Positive for back pain.  Neurological: Negative.        Objective    BP 124/72   Pulse 70   Temp 98 F (36.7 C)   Resp 16   Ht 6' (1.829 m)   Wt 203 lb (92.1 kg)   BMI 27.53 kg/m     Physical Exam   Some swelling over subscapular muscles on left. Moderate tenderness over left posterior ribs.   No results found for any visits on 07/26/20.  Assessment & Plan     1. Rib pain on left side Still some tightness of musculature inferior to left scapula, but tender area is not over posterior ribs and interfering with ability to lift and perform his required work duties. - DG Ribs Unilateral Left; Future - traMADol (ULTRAM) 50 MG tablet; Take 1 tablet (50 mg total) by mouth every 6 (six) hours as needed.  Dispense: 20 tablet; Refill: 0   Work excuse for this week.        The entirety of the information documented in the History of Present Illness, Review of Systems and Physical Exam were personally obtained by me. Portions of this information were initially documented by the CMA and reviewed by  me for thoroughness and accuracy.      Mila Merry, MD  [facility] [phone] (phone) [phone] (fax)  [facility]

## 2020-07-31 ENCOUNTER — Other Ambulatory Visit: Payer: Self-pay | Admitting: Family Medicine

## 2020-07-31 NOTE — Telephone Encounter (Signed)
Requested Prescriptions  Pending Prescriptions Disp Refills  . glipiZIDE (GLUCOTROL XL) 2.5 MG 24 hr tablet [Pharmacy Med Name: glipiZIDE ER 2.5 MG Oral Tablet Extended Release 24 Hour] 90 tablet 1    Sig: Take 1 tablet by mouth once daily with breakfast     Endocrinology:  Diabetes - Sulfonylureas Passed - 07/31/2020  4:19 PM      Passed - HBA1C is between 0 and 7.9 and within 180 days    Hemoglobin A1C  Date Value Ref Range Status  05/16/2020 6.3 (A) 4.0 - 5.6 % Final   Hgb A1c MFr Bld  Date Value Ref Range Status  07/22/2018 6.2 (H) <5.7 % of total Hgb Final    Comment:    For someone without known diabetes, a hemoglobin  A1c value between 5.7% and 6.4% is consistent with prediabetes and should be confirmed with a  follow-up test. . For someone with known diabetes, a value <7% indicates that their diabetes is well controlled. A1c targets should be individualized based on duration of diabetes, age, comorbid conditions, and other considerations. . This assay result is consistent with an increased risk of diabetes. . Currently, no consensus exists regarding use of hemoglobin A1c for diagnosis of diabetes for children. [name] - Valid encounter within last 6 months    Recent Outpatient Visits          5 days ago Rib pain on left side   [facility] Birdie Sons, MD   2 weeks ago Back strain, initial encounter   [facility] Birdie Sons, MD   2 months ago Hyperglycemia   [facility] Birdie Sons, MD   5 months ago Type 2 diabetes mellitus with other specified complication, without long-term current use of insulin [facility])   [facility] Birdie Sons, MD   9 months ago Deltoid tendinitis of left shoulder   [place], Vickki Muff, [place]

## 2020-08-24 DIAGNOSIS — D729 Disorder of white blood cells, unspecified: Secondary | ICD-10-CM | POA: Diagnosis not present

## 2020-08-24 DIAGNOSIS — E0590 Thyrotoxicosis, unspecified without thyrotoxic crisis or storm: Secondary | ICD-10-CM | POA: Diagnosis not present

## 2020-08-24 DIAGNOSIS — E1165 Type 2 diabetes mellitus with hyperglycemia: Secondary | ICD-10-CM | POA: Diagnosis not present

## 2020-09-27 DIAGNOSIS — L03113 Cellulitis of right upper limb: Secondary | ICD-10-CM | POA: Diagnosis not present

## 2020-10-20 ENCOUNTER — Encounter (INDEPENDENT_AMBULATORY_CARE_PROVIDER_SITE_OTHER): Payer: Self-pay

## 2020-10-24 ENCOUNTER — Ambulatory Visit (INDEPENDENT_AMBULATORY_CARE_PROVIDER_SITE_OTHER): Payer: PPO | Admitting: Gastroenterology

## 2020-10-24 ENCOUNTER — Encounter (INDEPENDENT_AMBULATORY_CARE_PROVIDER_SITE_OTHER): Payer: Self-pay | Admitting: Gastroenterology

## 2020-10-24 ENCOUNTER — Other Ambulatory Visit: Payer: Self-pay

## 2020-10-24 ENCOUNTER — Ambulatory Visit (INDEPENDENT_AMBULATORY_CARE_PROVIDER_SITE_OTHER): Payer: PPO | Admitting: Internal Medicine

## 2020-10-24 VITALS — BP 143/69 | HR 85 | Temp 98.4°F | Ht 72.0 in | Wt 195.0 lb

## 2020-10-24 DIAGNOSIS — K7581 Nonalcoholic steatohepatitis (NASH): Secondary | ICD-10-CM

## 2020-10-24 DIAGNOSIS — K7460 Unspecified cirrhosis of liver: Secondary | ICD-10-CM

## 2020-10-24 DIAGNOSIS — Z86010 Personal history of colonic polyps: Secondary | ICD-10-CM | POA: Diagnosis not present

## 2020-10-24 DIAGNOSIS — K219 Gastro-esophageal reflux disease without esophagitis: Secondary | ICD-10-CM

## 2020-10-24 DIAGNOSIS — K2270 Barrett's esophagus without dysplasia: Secondary | ICD-10-CM | POA: Diagnosis not present

## 2020-10-24 NOTE — Patient Instructions (Signed)
Perform blood workup Schedule EGD Schedule liver US - Reduce salt intake to <2 g per day - Can take Tylenol max of 2 g per day (650 mg q8h) for pain - Avoid NSAIDs for pain - Avoid eating raw oysters or shellfish - Ensure every night before going to sleep

## 2020-10-24 NOTE — H&P (View-Only) (Signed)
[name], M.D. Gastroenterology & Hepatology [facility] [address] [place], [place] [address]  Primary Care Physician: Birdie Sons, [place] [address]  I will communicate my assessment and recommendations to the referring MD via EMR.  Problems: 1. NASH cirrhosis 2. Grade III esophageal varices 3. GERD 4. BE without dysplasia  History of Present Illness: Stephen Burch is a 65 y.o. male with past medical history of NASH cirrhosis complicated by grade 3 esophageal varices, systolic heart failure, GERD complicated by Barrett's esophagus without dysplasia, history of intraoperatively cardiac arrest, who presents for follow up of liver cirrhosis.  The patient was last seen on 01/19/2020. At that time, the patient had surveillance labs for cirrhosis checked and also had a liver ultrasound performed for [facility] screening.  He underwent an EGD on 06/30/2020, was found to have grade 3 esophageal varices, had 1 band placed with complete eradication of his varices, also had presence of changes suggestive of short Barrett's esophagus 1 cm, portal hypertensive gastropathy, also presence of 6 mm polyp in the gastric polyp that was not removed.  Patient states feeling well, denies any complaints at the moment.  His bloating has actually improved after he started taking Gas-X over the counter.  He also reported that he has been compliant with the intake of Protonix as this has led to complete control of his heartburn episodes. The patient denies having any nausea, vomiting, fever, chills, hematochezia, melena, hematemesis, abdominal distention, abdominal pain, diarrhea, jaundice, pruritus or weight loss.  Last EGD:as above Last Colonoscopy:02/10/2020 patient had 2 polyps removed from her sigmoid colon (pathology was consistent with TA x2), random colon biopsies were negative for microscopic colitis, external  hemorrhoids.  Past Medical History: Past Medical History:  Diagnosis Date  . Acute systolic (congestive) heart failure ([place]) 04/30/2018  . Arthritis    knees, wrists  . Barrett's esophagus   . Cardiac arrest ([place]) 02/22/2016  . Complication of anesthesia    02-22-2016 intraop cardiac arrest immediately after vancomyocin administration , pt cardiac arrest w/ Vfib,  please refer to anesthesia record in epic  . Dysrhythmia    Afib 12/2018 - converted to NSR  . Esophageal varices determined by endoscopy ([place]) 11/2016   grade 1  . GERD (gastroesophageal reflux disease)   . Hiatal hernia   . History of adenomatous polyp of colon   . History of asthma    as child  . History of DVT (deep vein thrombosis)    right lower extremitty post-op right total knee surgery 09/ 2010,  treated w/ coumadin  . History of esophageal dilatation 07/1998   for schatzski ring  . History of kidney stones   . History of staph infection 04/2016   MSSA infection of right total knee w/ sepsis  . Hx of cardiac arrest    02-22-2016  intraoperative, immediately following moving pt into prone position and vancomyocin administration, cardiac arrest w/ Vfib (referred to anesthesia record in epic) pt extubated himself next day  . Infection of prosthetic right knee joint ([place])   . Liver cirrhosis secondary to NASH (nonalcoholic steatohepatitis) ([place])    NAFLD-- followed by dr Laural Golden---  liver bx 09-30-2012  mild portal and focal sinusoidal fibrosis  . Pyogenic arthritis of right knee joint ([place]) 04/02/2016  . Scapholunate advanced collapse of left wrist    deformity  . Shock circulatory ([place])   . Staphylococcus aureus bacteremia with sepsis ([place])   .  Thrombocytopenia ([place])    10-09-2017 per pt his platelets have always been low , followed by pcp, never been referred to hematologist    Past Surgical History: Past Surgical History:  Procedure Laterality Date  . APPLICATION OF WOUND VAC Right 02/22/2016   Procedure:  APPLICATION OF WOUND VAC;  Surgeon: Vickey Huger, MD;  Location: [place];  Service: Orthopedics;  Laterality: Right;  . BIOPSY  02/10/2020   Procedure: BIOPSY;  Surgeon: Rogene Houston, MD;  [facility];  Service: Endoscopy;;  . CARDIAC CATHETERIZATION N/A 02/24/2016   Procedure: Left Heart Cath and Coronary Angiography;  Surgeon: Burnell Blanks, MD;  Location: [place] [facility];  Service: Cardiovascular;  Laterality: N/A;  No angiographic evidence of CAD,  normal LVSF, ef 50-55%  . CARPECTOMY Left 10/17/2017   Procedure: LEFT WRIST PROXIMAL ROW CARPECTOMY WITH POSTEROR IMBROSSEOUS NERVE EXCISION;  Surgeon: Charlotte Crumb, MD;  Location: [place];  Service: Orthopedics;  Laterality: Left;  AXILLARY BLOCK  . CARPECTOMY WITH RADIAL STYLOIDECTOMY Left 02/11/2019   Procedure: LEFT WRIST RADIAL STYLOIDECTOMY;  Surgeon: Charlotte Crumb, MD;  Location: [place];  Service: Orthopedics;  Laterality: Left;  . COLONOSCOPY    . COLONOSCOPY WITH PROPOFOL N/A 02/10/2020   Procedure: COLONOSCOPY WITH PROPOFOL;  Surgeon: Rogene Houston, MD;  [facility];  Service: Endoscopy;  Laterality: N/A;  955  . EAR CYST EXCISION Right 09/06/2014   Procedure: OPEN EXCISION BAKER'S CYST RIGHT KNEE;  Surgeon: Vickey Huger, MD;  Location: [place];  Service: Orthopedics;  Laterality: Right;  . ESOPHAGEAL BANDING  06/30/2020   Procedure: ESOPHAGEAL BANDING;  Surgeon: Rogene Houston, MD;  [facility];  Service: Endoscopy;;  . ESOPHAGEAL DILATION  1996/ 2000  . ESOPHAGOGASTRODUODENOSCOPY N/A 09/17/2012   Procedure: ESOPHAGOGASTRODUODENOSCOPY (EGD);  Surgeon: Rogene Houston, MD;  [facility];  Service: Endoscopy;  Laterality: N/A;  200  . ESOPHAGOGASTRODUODENOSCOPY N/A 12/15/2015   Procedure: ESOPHAGOGASTRODUODENOSCOPY (EGD);  Surgeon: Rogene Houston, MD;  [facility];  Service: Endoscopy;  Laterality: N/A;  1245  . ESOPHAGOGASTRODUODENOSCOPY N/A  12/17/2016   Procedure: ESOPHAGOGASTRODUODENOSCOPY (EGD);  Surgeon: Rogene Houston, MD;  [facility];  Service: Endoscopy;  Laterality: N/A;  210  . ESOPHAGOGASTRODUODENOSCOPY N/A 06/30/2020   Procedure: ESOPHAGOGASTRODUODENOSCOPY (EGD);  Surgeon: Rogene Houston, MD;  [facility];  Service: Endoscopy;  Laterality: N/A;  11:15  . ESOPHAGOGASTRODUODENOSCOPY (EGD) WITH PROPOFOL N/A 02/10/2020   Procedure: ESOPHAGOGASTRODUODENOSCOPY (EGD) WITH PROPOFOL;  Surgeon: Rogene Houston, MD;  [facility];  Service: Endoscopy;  Laterality: N/A;  . I & D KNEE WITH POLY EXCHANGE Right 04/02/2016   Procedure: IRRIGATION AND DEBRIDEMENT RIGHT KNEE WITH POLY EXCHANGE;  Surgeon: Vickey Huger, MD;  Location: [place];  Service: Orthopedics;  Laterality: Right;  . INCISION AND DRAINAGE HEMATOMA POST LEFT  TOTAL KNEE  2012  . INGUINAL HERNIA REPAIR Bilateral 05/22/2013   Procedure: REPAIR OF RECURRENT INCARCERATED INGUINAL HERNIA WITH MESH RIGHT SIDE,  REPAIR OF RECURRENT INGUINAL HERNIA WITH MESH LEFT SIDE;  Surgeon: [name], MD;  Location: [place];  Service: General;  Laterality: Bilateral;  . INGUINAL HERNIA REPAIR Bilateral 11/1997  . INSERTION OF MESH Bilateral 05/22/2013   Procedure: INSERTION OF MESH;  Surgeon: [name], MD;  Location: [place];  Service: General;  Laterality: Bilateral;  . IRRIGATION AND DEBRIDEMENT KNEE Right 02/22/2016   Procedure: IRRIGATION AND DEBRIDEMENT KNEE;  Surgeon:  Vickey Huger, MD;  Location: [place];  Service: Orthopedics;  Laterality: Right;  . Irrigation and Debridement right knee  03/23/2009   Dr. Sabra Heck, [facility]  . KNEE ARTHROSCOPY W/ SYNOVECTOMY Right 01/30/2010   AND DEBRIDEMENT OF HETEROTOPIC  . LIVER BIOPSY  09/30/2012  . REVISION TOTAL KNEE ARTHROPLASTY Left fall 2012  . TONSILLECTOMY  child  . TOTAL KNEE ARTHROPLASTY Bilateral right 09/ 2010/  left 09-18-2010  . TRANSTHORACIC ECHOCARDIOGRAM  02/22/2016   ef 30-35% (per cardiac  cath 02-24-2016 normal), severe hypokinesis of the mid-apicalanteroseptal and apical myocardium,  grade 1 diastolic dysfunction/ trivial PR and TR  . TRIGGER FINGER RELEASE Left 02/11/2019   Procedure: LEFT WRIST STENOSING TENOSYNOVITIS RELEASE;  Surgeon: Charlotte Crumb, MD;  Location: [place];  Service: Orthopedics;  Laterality: Left;  MAC WITH AXILLARY BLOCK  . WRIST ARTHROPLASTY Left 10/17/2017   Procedure: CAPITATE RESURFACING ARTHROPLASTY;  Surgeon: Charlotte Crumb, MD;  Location: [facility];  Service: Orthopedics;  Laterality: Left;    Family History: Family History  Problem Relation Age of Onset  . Arthritis Father        died age 46  . GI Bleed Father        upper GI Bleed, non-ETOH cirrhosis  . Arthritis Brother   . Diabetes Brother        type 2  . Diabetes Paternal Uncle        type 2  . Heart attack Maternal Grandmother   . Heart attack Maternal Grandfather   . Colon cancer Neg Hx   . Colon polyps Neg Hx     Social History: Social History   Tobacco Use  Smoking Status Former Smoker  . Packs/day: 0.25  . Years: 3.00  . Pack years: 0.75  . Types: Cigarettes  . Quit date: 10/10/1974  . Years since quitting: 46.0  Smokeless Tobacco Never Used   Social History   Substance and Sexual Activity  Alcohol Use Not Currently   Social History   Substance and Sexual Activity  Drug Use No    Allergies: Allergies  Allergen Reactions  . Asa [Aspirin] Shortness Of Breath    "asthma symptoms"  . Penicillins Shortness Of Breath    Has patient had a PCN reaction causing immediate rash, facial/tongue/throat swelling, SOB or lightheadedness with hypotension: Yes Has patient had a PCN reaction causing severe rash involving mucus membranes or skin necrosis: No Has patient had a PCN reaction that required hospitalization No Has patient had a PCN reaction occurring within the last 10 years: No If all of the above answers are "NO", then may proceed with  Cephalosporin use. "asthma symptoms" Patient has tolerated cefazolin, ceftriaxo  . Vancomycin Anaphylaxis    Immediately following being turned into prone position and Vancomycin administration in the OR patient cardiac arrest w/  Vfib.    Medications: Current Outpatient Medications  Medication Sig Dispense Refill  . glipiZIDE (GLUCOTROL XL) 2.5 MG 24 hr tablet Take 1 tablet by mouth once daily with breakfast 90 tablet 1  . pantoprazole (PROTONIX) 40 MG tablet Take 40 mg by mouth daily.     . clindamycin (CLEOCIN) 300 MG capsule Take 600 mg by mouth See admin instructions. Take 2 capsules (600 mg) by mouth 1 hour prior to dental procedures. (Patient not taking: No sig reported)     No current facility-administered medications for this visit.    Review of Systems: GENERAL: negative for malaise, night sweats HEENT: No changes in hearing or vision, no  nose bleeds or other nasal problems. NECK: Negative for lumps, goiter, pain and significant neck swelling RESPIRATORY: Negative for cough, wheezing CARDIOVASCULAR: Negative for chest pain, leg swelling, palpitations, orthopnea GI: SEE HPI MUSCULOSKELETAL: Negative for joint pain or swelling, back pain, and muscle pain. SKIN: Negative for lesions, rash PSYCH: Negative for sleep disturbance, mood disorder and recent psychosocial stressors. HEMATOLOGY Negative for prolonged bleeding, bruising easily, and swollen nodes. ENDOCRINE: Negative for cold or heat intolerance, polyuria, polydipsia and goiter. NEURO: negative for tremor, gait imbalance, syncope and seizures. The remainder of the review of systems is noncontributory.   Physical Exam: BP (!) 143/69 (BP Location: Left Arm, Patient Position: Sitting, Cuff Size: Large)   Pulse 85   Temp 98.4 F (36.9 C) (Oral)   Ht 6' (1.829 m)   Wt 195 lb (88.5 kg)   BMI 26.45 kg/m  GENERAL: The patient is AO x3, in no acute distress. HEENT: Head is normocephalic and atraumatic. EOMI are intact.  Mouth is well hydrated and without lesions. NECK: Supple. No masses LUNGS: Clear to auscultation. No presence of rhonchi/wheezing/rales. Adequate chest expansion HEART: RRR, normal s1 and s2. ABDOMEN: Soft, nontender, no guarding, no peritoneal signs, and nondistended. BS +. No masses. EXTREMITIES: Without any cyanosis, clubbing, rash, lesions or edema. NEUROLOGIC: AOx3, no focal motor deficit. SKIN: no jaundice, no rashes  Imaging/Labs: as above  I personally reviewed and interpreted the available labs, imaging and endoscopic files.  Impression and Plan: Stephen Burch is a 65 y.o. male with past medical history of NASH cirrhosis complicated by grade 3 esophageal varices, systolic heart failure, GERD complicated by Barrett's esophagus without dysplasia, history of intraoperatively cardiac arrest, who presents for follow up of liver cirrhosis.  The patient has not presented any major decompensating event but had evidence of large varices during most recent endoscopic evaluation.  He is due for a repeat EGD which will need to be scheduled.  I discussed with the patient potentially adding beta-blocker to decrease his risk of variceal progression based on what we find in his upcoming endoscopy.  He does not have any evidence of active ongoing inflammation of his liver based on his most recent blood testing.  We will repeat MELD labs and AFP as part of his surveillance test but also will order liver ultrasound for [facility] screening. Will need to continue PPI at same dose  -Check MELD labs and AFP -Schedule EGD -Schedule liver US - Reduce salt intake to <2 g per day - Can take Tylenol max of 2 g per day (650 mg q8h) for pain - Avoid NSAIDs for pain - Avoid eating raw oysters or shellfish - Ensure every night before going to sleep - c/w  Pantoprazole mg qday  - RTC 6 months   All questions were answered.      Harvel Quale, MD Gastroenterology and Hepatology [facility]

## 2020-10-24 NOTE — Progress Notes (Signed)
Stephen Burch, M.D. Gastroenterology & Hepatology [facility] [address] [place], [place] [address]  Primary Care Physician: Birdie Sons, [place] [address]  I will communicate my assessment and recommendations to the referring MD via EMR.  Problems: 1. NASH cirrhosis 2. Grade III esophageal varices 3. GERD 4. BE without dysplasia  History of Present Illness: Stephen Burch is a 65 y.o. male with past medical history of NASH cirrhosis complicated by grade 3 esophageal varices, systolic heart failure, GERD complicated by Barrett's esophagus without dysplasia, history of intraoperatively cardiac arrest, who presents for follow up of liver cirrhosis.  The patient was last seen on 01/19/2020. At that time, the patient had surveillance labs for cirrhosis checked and also had a liver ultrasound performed for [facility] screening.  He underwent an EGD on 06/30/2020, was found to have grade 3 esophageal varices, had 1 band placed with complete eradication of his varices, also had presence of changes suggestive of short Barrett's esophagus 1 cm, portal hypertensive gastropathy, also presence of 6 mm polyp in the gastric polyp that was not removed.  Patient states feeling well, denies any complaints at the moment.  His bloating has actually improved after he started taking Gas-X over the counter.  He also reported that he has been compliant with the intake of Protonix as this has led to complete control of his heartburn episodes. The patient denies having any nausea, vomiting, fever, chills, hematochezia, melena, hematemesis, abdominal distention, abdominal pain, diarrhea, jaundice, pruritus or weight loss.  Last EGD:as above Last Colonoscopy:02/10/2020 patient had 2 polyps removed from her sigmoid colon (pathology was consistent with TA x2), random colon biopsies were negative for microscopic colitis, external  hemorrhoids.  Past Medical History: Past Medical History:  Diagnosis Date  . Acute systolic (congestive) heart failure ([place]) 04/30/2018  . Arthritis    knees, wrists  . Barrett's esophagus   . Cardiac arrest ([place]) 02/22/2016  . Complication of anesthesia    02-22-2016 intraop cardiac arrest immediately after vancomyocin administration , pt cardiac arrest w/ Vfib,  please refer to anesthesia record in epic  . Dysrhythmia    Afib 12/2018 - converted to NSR  . Esophageal varices determined by endoscopy ([place]) 11/2016   grade 1  . GERD (gastroesophageal reflux disease)   . Hiatal hernia   . History of adenomatous polyp of colon   . History of asthma    as child  . History of DVT (deep vein thrombosis)    right lower extremitty post-op right total knee surgery 09/ 2010,  treated w/ coumadin  . History of esophageal dilatation 07/1998   for schatzski ring  . History of kidney stones   . History of staph infection 04/2016   MSSA infection of right total knee w/ sepsis  . Hx of cardiac arrest    02-22-2016  intraoperative, immediately following moving pt into prone position and vancomyocin administration, cardiac arrest w/ Vfib (referred to anesthesia record in epic) pt extubated himself next day  . Infection of prosthetic right knee joint ([place])   . Liver cirrhosis secondary to NASH (nonalcoholic steatohepatitis) ([place])    NAFLD-- followed by dr Laural Golden---  liver bx 09-30-2012  mild portal and focal sinusoidal fibrosis  . Pyogenic arthritis of right knee joint ([place]) 04/02/2016  . Scapholunate advanced collapse of left wrist    deformity  . Shock circulatory ([place])   . Staphylococcus aureus bacteremia with sepsis ([place])   .  Thrombocytopenia ([facility])    10-09-2017 per pt his platelets have always been low , followed by pcp, never been referred to hematologist    Past Surgical History: Past Surgical History:  Procedure Laterality Date  . APPLICATION OF WOUND VAC Right 02/22/2016   Procedure:  APPLICATION OF WOUND VAC;  Surgeon: Steve Lucey, MD;  [facility];  Service: Orthopedics;  Laterality: Right;  . BIOPSY  02/10/2020   Procedure: BIOPSY;  Surgeon: Rehman, Najeeb U, MD;  [facility];  Service: Endoscopy;;  . CARDIAC CATHETERIZATION N/A 02/24/2016   Procedure: Left Heart Cath and Coronary Angiography;  Surgeon: Christopher D McAlhany, MD;  [facility]: [facility];  Service: Cardiovascular;  Laterality: N/A;  No angiographic evidence of CAD,  normal LVSF, ef 50-55%  . CARPECTOMY Left 10/17/2017   Procedure: LEFT WRIST PROXIMAL ROW CARPECTOMY WITH POSTEROR IMBROSSEOUS NERVE EXCISION;  Surgeon: Weingold, Matthew, MD;  Location: [facility];  Service: Orthopedics;  Laterality: Left;  AXILLARY BLOCK  . CARPECTOMY WITH RADIAL STYLOIDECTOMY Left 02/11/2019   Procedure: LEFT WRIST RADIAL STYLOIDECTOMY;  Surgeon: Weingold, Matthew, MD;  [facility];  Service: Orthopedics;  Laterality: Left;  . COLONOSCOPY    . COLONOSCOPY WITH PROPOFOL N/A 02/10/2020   Procedure: COLONOSCOPY WITH PROPOFOL;  Surgeon: Rehman, Najeeb U, MD;  [facility];  Service: Endoscopy;  Laterality: N/A;  955  . EAR CYST EXCISION Right 09/06/2014   Procedure: OPEN EXCISION BAKER'S CYST RIGHT KNEE;  Surgeon: Steve Lucey, MD;  [facility];  Service: Orthopedics;  Laterality: Right;  . ESOPHAGEAL BANDING  06/30/2020   Procedure: ESOPHAGEAL BANDING;  Surgeon: Rehman, Najeeb U, MD;  [facility];  Service: Endoscopy;;  . ESOPHAGEAL DILATION  1996/ 2000  . ESOPHAGOGASTRODUODENOSCOPY N/A 09/17/2012   Procedure: ESOPHAGOGASTRODUODENOSCOPY (EGD);  Surgeon: Najeeb U Rehman, MD;  [facility];  Service: Endoscopy;  Laterality: N/A;  200  . ESOPHAGOGASTRODUODENOSCOPY N/A 12/15/2015   Procedure: ESOPHAGOGASTRODUODENOSCOPY (EGD);  Surgeon: Najeeb U Rehman, MD;  [facility];  Service: Endoscopy;  Laterality: N/A;  1245  . ESOPHAGOGASTRODUODENOSCOPY N/A  12/17/2016   Procedure: ESOPHAGOGASTRODUODENOSCOPY (EGD);  Surgeon: Rehman, Najeeb U, MD;  [facility];  Service: Endoscopy;  Laterality: N/A;  210  . ESOPHAGOGASTRODUODENOSCOPY N/A 06/30/2020   Procedure: ESOPHAGOGASTRODUODENOSCOPY (EGD);  Surgeon: Rehman, Najeeb U, MD;  [facility];  Service: Endoscopy;  Laterality: N/A;  11:15  . ESOPHAGOGASTRODUODENOSCOPY (EGD) WITH PROPOFOL N/A 02/10/2020   Procedure: ESOPHAGOGASTRODUODENOSCOPY (EGD) WITH PROPOFOL;  Surgeon: Rehman, Najeeb U, MD;  [facility];  Service: Endoscopy;  Laterality: N/A;  . I & D KNEE WITH POLY EXCHANGE Right 04/02/2016   Procedure: IRRIGATION AND DEBRIDEMENT RIGHT KNEE WITH POLY EXCHANGE;  Surgeon: Steve Lucey, MD;  [facility];  Service: Orthopedics;  Laterality: Right;  . INCISION AND DRAINAGE HEMATOMA POST LEFT  TOTAL KNEE  2012  . INGUINAL HERNIA REPAIR Bilateral 05/22/2013   Procedure: REPAIR OF RECURRENT INCARCERATED INGUINAL HERNIA WITH MESH RIGHT SIDE,  REPAIR OF RECURRENT INGUINAL HERNIA WITH MESH LEFT SIDE;  Surgeon: Haywood M Ingram, MD;  [facility];  Service: General;  Laterality: Bilateral;  . INGUINAL HERNIA REPAIR Bilateral 11/1997  . INSERTION OF MESH Bilateral 05/22/2013   Procedure: INSERTION OF MESH;  Surgeon: Haywood M Ingram, MD;  [facility];  Service: General;  Laterality: Bilateral;  . IRRIGATION AND DEBRIDEMENT KNEE Right 02/22/2016   Procedure: IRRIGATION AND DEBRIDEMENT KNEE;  Surgeon:   Steve Lucey, MD;  [facility];  Service: Orthopedics;  Laterality: Right;  . Irrigation and Debridement right knee  03/23/2009   Dr. Miller, [facility]  . KNEE ARTHROSCOPY W/ SYNOVECTOMY Right 01/30/2010   AND DEBRIDEMENT OF HETEROTOPIC  . LIVER BIOPSY  09/30/2012  . REVISION TOTAL KNEE ARTHROPLASTY Left fall 2012  . TONSILLECTOMY  child  . TOTAL KNEE ARTHROPLASTY Bilateral right 09/ 2010/  left 09-18-2010  . TRANSTHORACIC ECHOCARDIOGRAM  02/22/2016   ef 30-35% (per cardiac  cath 02-24-2016 normal), severe hypokinesis of the mid-apicalanteroseptal and apical myocardium,  grade 1 diastolic dysfunction/ trivial PR and TR  . TRIGGER FINGER RELEASE Left 02/11/2019   Procedure: LEFT WRIST STENOSING TENOSYNOVITIS RELEASE;  Surgeon: Weingold, Matthew, MD;  [facility];  Service: Orthopedics;  Laterality: Left;  MAC WITH AXILLARY BLOCK  . WRIST ARTHROPLASTY Left 10/17/2017   Procedure: CAPITATE RESURFACING ARTHROPLASTY;  Surgeon: Weingold, Matthew, MD;  Location: [facility];  Service: Orthopedics;  Laterality: Left;    Family History: Family History  Problem Relation Age of Onset  . Arthritis Father        died age 79  . GI Bleed Father        upper GI Bleed, non-ETOH cirrhosis  . Arthritis Brother   . Diabetes Brother        type 2  . Diabetes Paternal Uncle        type 2  . Heart attack Maternal Grandmother   . Heart attack Maternal Grandfather   . Colon cancer Neg Hx   . Colon polyps Neg Hx     Social History: Social History   Tobacco Use  Smoking Status Former Smoker  . Packs/day: 0.25  . Years: 3.00  . Pack years: 0.75  . Types: Cigarettes  . Quit date: 10/10/1974  . Years since quitting: 46.0  Smokeless Tobacco Never Used   Social History   Substance and Sexual Activity  Alcohol Use Not Currently   Social History   Substance and Sexual Activity  Drug Use No    Allergies: Allergies  Allergen Reactions  . Asa [Aspirin] Shortness Of Breath    "asthma symptoms"  . Penicillins Shortness Of Breath    Has patient had a PCN reaction causing immediate rash, facial/tongue/throat swelling, SOB or lightheadedness with hypotension: Yes Has patient had a PCN reaction causing severe rash involving mucus membranes or skin necrosis: No Has patient had a PCN reaction that required hospitalization No Has patient had a PCN reaction occurring within the last 10 years: No If all of the above answers are "NO", then may proceed with  Cephalosporin use. "asthma symptoms" Patient has tolerated cefazolin, ceftriaxo  . Vancomycin Anaphylaxis    Immediately following being turned into prone position and Vancomycin administration in the OR patient cardiac arrest w/  Vfib.    Medications: Current Outpatient Medications  Medication Sig Dispense Refill  . glipiZIDE (GLUCOTROL XL) 2.5 MG 24 hr tablet Take 1 tablet by mouth once daily with breakfast 90 tablet 1  . pantoprazole (PROTONIX) 40 MG tablet Take 40 mg by mouth daily.     . clindamycin (CLEOCIN) 300 MG capsule Take 600 mg by mouth See admin instructions. Take 2 capsules (600 mg) by mouth 1 hour prior to dental procedures. (Patient not taking: No sig reported)     No current facility-administered medications for this visit.    Review of Systems: GENERAL: negative for malaise, night sweats HEENT: No changes in hearing or vision, no   nose bleeds or other nasal problems. NECK: Negative for lumps, goiter, pain and significant neck swelling RESPIRATORY: Negative for cough, wheezing CARDIOVASCULAR: Negative for chest pain, leg swelling, palpitations, orthopnea GI: SEE HPI MUSCULOSKELETAL: Negative for joint pain or swelling, back pain, and muscle pain. SKIN: Negative for lesions, rash PSYCH: Negative for sleep disturbance, mood disorder and recent psychosocial stressors. HEMATOLOGY Negative for prolonged bleeding, bruising easily, and swollen nodes. ENDOCRINE: Negative for cold or heat intolerance, polyuria, polydipsia and goiter. NEURO: negative for tremor, gait imbalance, syncope and seizures. The remainder of the review of systems is noncontributory.   Physical Exam: BP (!) 143/69 (BP Location: Left Arm, Patient Position: Sitting, Cuff Size: Large)   Pulse 85   Temp 98.4 F (36.9 C) (Oral)   Ht 6' (1.829 m)   Wt 195 lb (88.5 kg)   BMI 26.45 kg/m  GENERAL: The patient is AO x3, in no acute distress. HEENT: Head is normocephalic and atraumatic. EOMI are intact.  Mouth is well hydrated and without lesions. NECK: Supple. No masses LUNGS: Clear to auscultation. No presence of rhonchi/wheezing/rales. Adequate chest expansion HEART: RRR, normal s1 and s2. ABDOMEN: Soft, nontender, no guarding, no peritoneal signs, and nondistended. BS +. No masses. EXTREMITIES: Without any cyanosis, clubbing, rash, lesions or edema. NEUROLOGIC: AOx3, no focal motor deficit. SKIN: no jaundice, no rashes  Imaging/Labs: as above  I personally reviewed and interpreted the available labs, imaging and endoscopic files.  Impression and Plan: ALDON HENGST is a 65 y.o. male with past medical history of NASH cirrhosis complicated by grade 3 esophageal varices, systolic heart failure, GERD complicated by Barrett's esophagus without dysplasia, history of intraoperatively cardiac arrest, who presents for follow up of liver cirrhosis.  The patient has not presented any major decompensating event but had evidence of large varices during most recent endoscopic evaluation.  He is due for a repeat EGD which will need to be scheduled.  I discussed with the patient potentially adding beta-blocker to decrease his risk of variceal progression based on what we find in his upcoming endoscopy.  He does not have any evidence of active ongoing inflammation of his liver based on his most recent blood testing.  We will repeat MELD labs and AFP as part of his surveillance test but also will order liver ultrasound for [facility] screening. Will need to continue PPI at same dose  -Check MELD labs and AFP -Schedule EGD -Schedule liver US - Reduce salt intake to <2 g per day - Can take Tylenol max of 2 g per day (650 mg q8h) for pain - Avoid NSAIDs for pain - Avoid eating raw oysters or shellfish - Ensure every night before going to sleep - c/w  Pantoprazole mg qday  - RTC 6 months   All questions were answered.      Harvel Quale, MD Gastroenterology and Hepatology [facility]

## 2020-10-25 ENCOUNTER — Other Ambulatory Visit (INDEPENDENT_AMBULATORY_CARE_PROVIDER_SITE_OTHER): Payer: Self-pay

## 2020-10-25 DIAGNOSIS — I8500 Esophageal varices without bleeding: Secondary | ICD-10-CM

## 2020-10-26 ENCOUNTER — Encounter (INDEPENDENT_AMBULATORY_CARE_PROVIDER_SITE_OTHER): Payer: Self-pay

## 2020-10-26 DIAGNOSIS — K7460 Unspecified cirrhosis of liver: Secondary | ICD-10-CM | POA: Diagnosis not present

## 2020-10-26 DIAGNOSIS — K7581 Nonalcoholic steatohepatitis (NASH): Secondary | ICD-10-CM | POA: Diagnosis not present

## 2020-10-27 LAB — PROTIME-INR
INR: 1.1
Prothrombin Time: 11.2 s (ref 9.0–11.5)

## 2020-10-27 LAB — CBC
HCT: 41.5 % (ref 38.5–50.0)
Hemoglobin: 14.2 g/dL (ref 13.2–17.1)
MCH: 30.2 pg (ref 27.0–33.0)
MCHC: 34.2 g/dL (ref 32.0–36.0)
MCV: 88.3 fL (ref 80.0–100.0)
MPV: 11.1 fL (ref 7.5–12.5)
Platelets: 101 10*3/uL — ABNORMAL LOW (ref 140–400)
RBC: 4.7 10*6/uL (ref 4.20–5.80)
RDW: 13.2 % (ref 11.0–15.0)
WBC: 2.6 10*3/uL — ABNORMAL LOW (ref 3.8–10.8)

## 2020-10-27 LAB — COMPREHENSIVE METABOLIC PANEL
AG Ratio: 1.9 (calc) (ref 1.0–2.5)
ALT: 20 U/L (ref 9–46)
AST: 25 U/L (ref 10–35)
Albumin: 4.4 g/dL (ref 3.6–5.1)
Alkaline phosphatase (APISO): 123 U/L (ref 35–144)
BUN: 16 mg/dL (ref 7–25)
CO2: 28 mmol/L (ref 20–32)
Calcium: 8.6 mg/dL (ref 8.6–10.3)
Chloride: 104 mmol/L (ref 98–110)
Creat: 0.92 mg/dL (ref 0.70–1.25)
Globulin: 2.3 g/dL (calc) (ref 1.9–3.7)
Glucose, Bld: 125 mg/dL — ABNORMAL HIGH (ref 65–99)
Potassium: 4.3 mmol/L (ref 3.5–5.3)
Sodium: 140 mmol/L (ref 135–146)
Total Bilirubin: 2.1 mg/dL — ABNORMAL HIGH (ref 0.2–1.2)
Total Protein: 6.7 g/dL (ref 6.1–8.1)

## 2020-10-27 LAB — AFP TUMOR MARKER: AFP-Tumor Marker: 2.3 ng/mL (ref <6.1)

## 2020-10-28 ENCOUNTER — Ambulatory Visit (HOSPITAL_COMMUNITY)
Admission: RE | Admit: 2020-10-28 | Discharge: 2020-10-28 | Disposition: A | Discharge status: Home / Self Care | Payer: PPO | Source: Ambulatory Visit | Attending: Gastroenterology | Admitting: Gastroenterology

## 2020-10-28 DIAGNOSIS — K7581 Nonalcoholic steatohepatitis (NASH): Secondary | ICD-10-CM | POA: Diagnosis not present

## 2020-10-28 DIAGNOSIS — K7460 Unspecified cirrhosis of liver: Secondary | ICD-10-CM | POA: Diagnosis not present

## 2020-11-01 ENCOUNTER — Ambulatory Visit (INDEPENDENT_AMBULATORY_CARE_PROVIDER_SITE_OTHER): Payer: PPO | Admitting: Internal Medicine

## 2020-11-01 NOTE — Patient Instructions (Signed)
Stephen Burch  11/01/2020     @PREFPERIOPPHARMACY @   Your procedure is scheduled on  11/04/2020.   Report to [facility] at  0800  A.M.    Call this number if you have problems the morning of surgery:  [phone]   Remember:  Follow the diet and pre instructions given to you by the office.                    Take these medicines the morning of surgery with A SIP OF WATER  Protonix.    DO NOT take any medications for diabetes the morning of your procedure.  If your glucose is 70 or below, the morning of your procedure, drink 1/2 cup of clear liquid containing sugar and recheck your glucose in 15 minutes. If your glucose is still 70 or below, call [phone] for instructions.  If your glucose is 300 or above the morning of your procedure, call [phone] for instructions.     Please brush your teeth.  Do not wear jewelry, make-up or nail polish.  Do not wear lotions, powders, or perfumes, or deodorant.  Do not shave 48 hours prior to surgery.  Men may shave face and neck.  Do not bring valuables to the hospital.  [facility] is not responsible for any belongings or valuables.  Contacts, dentures or bridgework may not be worn into surgery.  Leave your suitcase in the car.  After surgery it may be brought to your room.  For patients admitted to the hospital, discharge time will be determined by your treatment team.  Patients discharged the day of surgery will not be allowed to drive home and must have someone with them for 24 hours.     DO NOT smoke tobacco or vape for 24 hours before your procedure.   Please read over the following fact sheets that you were given. Anesthesia Post-op Instructions and Care and Recovery After Surgery       Upper Endoscopy, Adult, Care After This sheet gives you information about how to care for yourself after your procedure. Your health care provider may also give you more specific instructions. If you have problems or  questions, contact your health care provider. What can I expect after the procedure? After the procedure, it is common to have:  A sore throat.  Mild stomach pain or discomfort.  Bloating.  Nausea. Follow these instructions at home:  Follow instructions from your health care provider about what to eat or drink after your procedure.  Return to your normal activities as told by your health care provider. Ask your health care provider what activities are safe for you.  Take over-the-counter and prescription medicines only as told by your health care provider.  If you were given a sedative during the procedure, it can affect you for several hours. Do not drive or operate machinery until your health care provider says that it is safe.  Keep all follow-up visits as told by your health care provider. This is important.   Contact a health care provider if you have:  A sore throat that lasts longer than one day.  Trouble swallowing. Get help right away if:  You vomit blood or your vomit looks like coffee grounds.  You have: ? A fever. ? Bloody, black, or tarry stools. ? A severe sore throat or you cannot swallow. ? Difficulty breathing. ? Severe pain in your chest or abdomen. Summary  After the  procedure, it is common to have a sore throat, mild stomach discomfort, bloating, and nausea.  If you were given a sedative during the procedure, it can affect you for several hours. Do not drive or operate machinery until your health care provider says that it is safe.  Follow instructions from your health care provider about what to eat or drink after your procedure.  Return to your normal activities as told by your health care provider. This information is not intended to replace advice given to you by your health care provider. Make sure you discuss any questions you have with your health care provider. Document Revised: 06/16/2019 Document Reviewed: 11/18/2017 Elsevier Patient  Education  2021 [place] After This sheet gives you information about how to care for yourself after your procedure. Your health care provider may also give you more specific instructions. If you have problems or questions, contact your health care provider. What can I expect after the procedure? After the procedure, it is common to have:  Tiredness.  Forgetfulness about what happened after the procedure.  Impaired judgment for important decisions.  Nausea or vomiting.  Some difficulty with balance. Follow these instructions at home: For the time period you were told by your health care provider:  Rest as needed.  Do not participate in activities where you could fall or become injured.  Do not drive or use machinery.  Do not drink alcohol.  Do not take sleeping pills or medicines that cause drowsiness.  Do not make important decisions or sign legal documents.  Do not take care of children on your own.      Eating and drinking  Follow the diet that is recommended by your health care provider.  Drink enough fluid to keep your urine pale yellow.  If you vomit: ? Drink water, juice, or soup when you can drink without vomiting. ? Make sure you have little or no nausea before eating solid foods. General instructions  Have a responsible adult stay with you for the time you are told. It is important to have someone help care for you until you are awake and alert.  Take over-the-counter and prescription medicines only as told by your health care provider.  If you have sleep apnea, surgery and certain medicines can increase your risk for breathing problems. Follow instructions from your health care provider about wearing your sleep device: ? Anytime you are sleeping, including during daytime naps. ? While taking prescription pain medicines, sleeping medicines, or medicines that make you drowsy.  Avoid smoking.  Keep all follow-up  visits as told by your health care provider. This is important. Contact a health care provider if:  You keep feeling nauseous or you keep vomiting.  You feel light-headed.  You are still sleepy or having trouble with balance after 24 hours.  You develop a rash.  You have a fever.  You have redness or swelling around the IV site. Get help right away if:  You have trouble breathing.  You have new-onset confusion at home. Summary  For several hours after your procedure, you may feel tired. You may also be forgetful and have poor judgment.  Have a responsible adult stay with you for the time you are told. It is important to have someone help care for you until you are awake and alert.  Rest as told. Do not drive or operate machinery. Do not drink alcohol or take sleeping pills.  Get help right away if you  have trouble breathing, or if you suddenly become confused. This information is not intended to replace advice given to you by your health care provider. Make sure you discuss any questions you have with your health care provider. Document Revised: 03/03/2020 Document Reviewed: 05/21/2019 Elsevier Patient Education  2021 [employer].

## 2020-11-02 ENCOUNTER — Encounter (HOSPITAL_COMMUNITY): Payer: Self-pay

## 2020-11-02 ENCOUNTER — Other Ambulatory Visit: Payer: Self-pay

## 2020-11-02 ENCOUNTER — Encounter (HOSPITAL_COMMUNITY)
Admission: RE | Admit: 2020-11-02 | Discharge: 2020-11-02 | Disposition: A | Discharge status: Home / Self Care | Payer: PPO | Source: Ambulatory Visit | Attending: Gastroenterology | Admitting: Gastroenterology

## 2020-11-02 ENCOUNTER — Other Ambulatory Visit (HOSPITAL_COMMUNITY)
Admission: RE | Admit: 2020-11-02 | Discharge: 2020-11-02 | Disposition: A | Discharge status: Home / Self Care | Payer: PPO | Source: Ambulatory Visit | Attending: Gastroenterology | Admitting: Gastroenterology

## 2020-11-02 DIAGNOSIS — Z01812 Encounter for preprocedural laboratory examination: Secondary | ICD-10-CM | POA: Diagnosis not present

## 2020-11-02 DIAGNOSIS — Z20822 Contact with and (suspected) exposure to covid-19: Secondary | ICD-10-CM | POA: Insufficient documentation

## 2020-11-02 HISTORY — DX: Prediabetes: R73.03

## 2020-11-03 LAB — SARS CORONAVIRUS 2 (TAT 6-24 HRS): SARS Coronavirus 2: NEGATIVE

## 2020-11-04 ENCOUNTER — Ambulatory Visit (HOSPITAL_COMMUNITY): Payer: PPO | Admitting: Anesthesiology

## 2020-11-04 ENCOUNTER — Encounter (HOSPITAL_COMMUNITY): Payer: Self-pay | Admitting: Gastroenterology

## 2020-11-04 ENCOUNTER — Encounter (HOSPITAL_COMMUNITY)
Admission: RE | Disposition: A | Discharge status: Home / Self Care | Payer: Self-pay | Source: Home / Self Care | Attending: Gastroenterology

## 2020-11-04 ENCOUNTER — Ambulatory Visit (HOSPITAL_COMMUNITY)
Admission: RE | Admit: 2020-11-04 | Discharge: 2020-11-04 | Disposition: A | Discharge status: Home / Self Care | Payer: PPO | Attending: Gastroenterology | Admitting: Gastroenterology

## 2020-11-04 DIAGNOSIS — Z880 Allergy status to penicillin: Secondary | ICD-10-CM | POA: Diagnosis not present

## 2020-11-04 DIAGNOSIS — K219 Gastro-esophageal reflux disease without esophagitis: Secondary | ICD-10-CM | POA: Insufficient documentation

## 2020-11-04 DIAGNOSIS — K317 Polyp of stomach and duodenum: Secondary | ICD-10-CM | POA: Diagnosis not present

## 2020-11-04 DIAGNOSIS — Z881 Allergy status to other antibiotic agents status: Secondary | ICD-10-CM | POA: Insufficient documentation

## 2020-11-04 DIAGNOSIS — Z96653 Presence of artificial knee joint, bilateral: Secondary | ICD-10-CM | POA: Diagnosis not present

## 2020-11-04 DIAGNOSIS — Z886 Allergy status to analgesic agent status: Secondary | ICD-10-CM | POA: Insufficient documentation

## 2020-11-04 DIAGNOSIS — Z8674 Personal history of sudden cardiac arrest: Secondary | ICD-10-CM | POA: Insufficient documentation

## 2020-11-04 DIAGNOSIS — I8500 Esophageal varices without bleeding: Secondary | ICD-10-CM

## 2020-11-04 DIAGNOSIS — I8510 Secondary esophageal varices without bleeding: Secondary | ICD-10-CM | POA: Insufficient documentation

## 2020-11-04 DIAGNOSIS — K7581 Nonalcoholic steatohepatitis (NASH): Secondary | ICD-10-CM | POA: Diagnosis not present

## 2020-11-04 DIAGNOSIS — I864 Gastric varices: Secondary | ICD-10-CM

## 2020-11-04 DIAGNOSIS — Z87891 Personal history of nicotine dependence: Secondary | ICD-10-CM | POA: Diagnosis not present

## 2020-11-04 DIAGNOSIS — I5020 Unspecified systolic (congestive) heart failure: Secondary | ICD-10-CM | POA: Insufficient documentation

## 2020-11-04 DIAGNOSIS — Z09 Encounter for follow-up examination after completed treatment for conditions other than malignant neoplasm: Secondary | ICD-10-CM | POA: Insufficient documentation

## 2020-11-04 DIAGNOSIS — Z7984 Long term (current) use of oral hypoglycemic drugs: Secondary | ICD-10-CM | POA: Diagnosis not present

## 2020-11-04 DIAGNOSIS — I509 Heart failure, unspecified: Secondary | ICD-10-CM | POA: Diagnosis not present

## 2020-11-04 DIAGNOSIS — Z79899 Other long term (current) drug therapy: Secondary | ICD-10-CM | POA: Diagnosis not present

## 2020-11-04 HISTORY — PX: ESOPHAGOGASTRODUODENOSCOPY (EGD) WITH PROPOFOL: SHX5813

## 2020-11-04 LAB — GLUCOSE, CAPILLARY: Glucose-Capillary: 147 mg/dL — ABNORMAL HIGH (ref 70–99)

## 2020-11-04 SURGERY — ESOPHAGOGASTRODUODENOSCOPY (EGD) WITH PROPOFOL
Anesthesia: General

## 2020-11-04 MED ORDER — SPOT INK MARKER SYRINGE KIT
PACK | SUBMUCOSAL | Status: AC
  Filled 2020-11-04: qty 5

## 2020-11-04 MED ORDER — PROPOFOL 10 MG/ML IV BOLUS
INTRAVENOUS | Status: AC
  Filled 2020-11-04: qty 60

## 2020-11-04 MED ORDER — CARVEDILOL 3.125 MG PO TABS: 3.1250 mg | ORAL_TABLET | Freq: Two times a day (BID) | ORAL | 1 refills | Status: DC

## 2020-11-04 MED ORDER — LACTATED RINGERS IV SOLN
INTRAVENOUS | Status: DC
  Administered 2020-11-04: 1000 mL via INTRAVENOUS

## 2020-11-04 MED ORDER — PROPOFOL 10 MG/ML IV BOLUS
INTRAVENOUS | Status: AC
  Filled 2020-11-04: qty 40

## 2020-11-04 MED ORDER — PROPOFOL 500 MG/50ML IV EMUL
INTRAVENOUS | Status: DC | PRN
  Administered 2020-11-04: 150 ug/kg/min via INTRAVENOUS

## 2020-11-04 MED ORDER — PROPOFOL 10 MG/ML IV BOLUS
INTRAVENOUS | Status: DC | PRN
  Administered 2020-11-04: 50 mg via INTRAVENOUS

## 2020-11-04 MED ORDER — STERILE WATER FOR IRRIGATION IR SOLN
Status: DC | PRN
  Administered 2020-11-04: 100 mL

## 2020-11-04 NOTE — Interval H&P Note (Signed)
History and Physical Interval Note:  11/04/2020 8:46 AM  Stephen Burch is a 65 y.o. male with past medical history of NASH cirrhosis complicated by grade 3 esophageal varices, systolic heart failure, GERD complicated by Barrett's esophagus without dysplasia, history of intraoperatively cardiac arrest, who presents for follow up of esophageal varices.  Patient denies having any complaints.  Denies any hematemesis, melena or hematochezia.  No abdominal pain or any other complaints.  GENERAL: The patient is AO x3, in no acute distress. HEENT: Head is normocephalic and atraumatic. EOMI are intact. Mouth is well hydrated and without lesions. NECK: Supple. No masses LUNGS: Clear to auscultation. No presence of rhonchi/wheezing/rales. Adequate chest expansion HEART: RRR, normal s1 and s2. ABDOMEN: Soft, nontender, no guarding, no peritoneal signs, and nondistended. BS +. No masses. EXTREMITIES: Without any cyanosis, clubbing, rash, lesions or edema. NEUROLOGIC: AOx3, no focal motor deficit. SKIN: no jaundice, no rashes   Stephen Burch  has presented today for surgery, with the diagnosis of Esophageal Varices.  The various methods of treatment have been discussed with the patient and family. After consideration of risks, benefits and other options for treatment, the patient has consented to  Procedure(s) with comments: ESOPHAGOGASTRODUODENOSCOPY (EGD) WITH PROPOFOL (N/A) - 9:45 as a surgical intervention.  The patient's history has been reviewed, patient examined, no change in status, stable for surgery.  I have reviewed the patient's chart and labs.  Questions were answered to the patient's satisfaction.     [name]

## 2020-11-04 NOTE — Discharge Instructions (Signed)
You are being discharged to home.  Resume your previous diet.  Your physician has recommended a repeat upper endoscopy in six months for surveillance.  Start Coreg 3.125 mg twice a day Return to clinic in 3 months to assess HR/BP and need to uptitrate medication.  Esophageal Varices  Esophageal varices are enlarged veins in the part of the body that moves food from the mouth to the stomach (esophagus). They develop when extra blood is forced to flow through these veins because the blood's normal flow is blocked. Without treatment, esophageal varices eventually break and bleed (hemorrhage), which can be life-threatening. What are the causes? This condition may be caused by:  Scarring of the liver due to alcoholism. This is the most common cause.  Long-term liver disease.  Severe heart failure.  A blood clot in a vein that supplies the liver.  A disease that causes inflammation in the organs and other body areas. What are the signs or symptoms? Esophageal varices usually do not cause symptoms unless they start to bleed. Symptoms of bleeding esophageal varices include:  Vomiting material that is bright red or that is black and looks like coffee grounds.  Coughing up blood.  Stools (feces) that look black and tarry.  Dizziness or light-headedness.  Low blood pressure.  Loss of consciousness. How is this diagnosed? This condition is diagnosed with a procedure called endoscopy. During endoscopy, your health care provider uses a flexible tube with a small camera on the end of it (endoscope) to look down your throat and examine your esophagus. You may also have other tests, including:  Imaging tests, such as a CT scan or ultrasound.  Blood tests. How is this treated? This condition may be treated with:  Medicines. Medicines are usually used to treat varices that are not bleeding.  Procedures. Procedures are done to treat varices that are bleeding. They stop bleeding, or reduce  pressure and the risk of bleeding. Procedures include: ? Placing an elastic band around the varices to keep them from bleeding. ? Replacing blood that you have lost due to bleeding. This may include getting a transfusion of blood or parts of blood, such as platelets or clotting factors. ? You may be given antibiotic medicine to help prevent infection. ? Getting an injection into the varices that causes it to shrink and close (sclerotherapy). You may also be given medicines that tighten blood vessels or change blood flow. ? Placing a balloon in the esophagus and inflating it. The balloon applies pressure to the bleeding veins to help stop the bleeding. ? Placing a small tube within the veins in the liver. This decreases blood flow and pressure in the esophageal varices. If other treatments do not work, you may need a liver transplant. Follow these instructions at home: Medicines  Take over-the-counter and prescription medicines only as told by your health care provider.  If you were prescribed an antibiotic medicine, take it as told by your health care provider. Do not stop taking the antibiotic even if you start to feel better.  Do not take any NSAIDs (such as aspirin or ibuprofen) before first getting approval from your health care provider. General instructions  Do not drink alcohol.  Return to your normal activities as told by your health care provider. Ask your health care provider what activities are safe for you.  Avoid vigorous physical activity. Ask your health care provider what exercises are safe for you.  Keep all follow-up visits.   Contact a health care provider  if:  You have pain in the abdomen.  You are unable to eat or drink. Get help right away if:  You vomit blood or have blood in your stool.  You have stools that look black or tarry.  You have chest pain.  You feel dizzy or have low blood pressure.  You lose consciousness. These symptoms may represent a  serious problem that is an emergency. Do not wait to see if the symptoms will go away. Get medical help right away. Call your local emergency services (911 in the [place] Do not drive yourself to the hospital. Summary  Esophageal varices are enlarged veins in the esophagus, the part of your body that moves food from your mouth to your stomach.  Without treatment, esophageal varices eventually break and bleed, which can be life-threatening.  Esophageal varices usually do not cause symptoms unless they start to bleed.  Keep all follow-up visits. This is important. This information is not intended to replace advice given to you by your health care provider. Make sure you discuss any questions you have with your health care provider. Document Revised: 10/06/2019 Document Reviewed: 10/06/2019 Elsevier Patient Education  2021 [place] [employer] This sheet gives you information about how to care for yourself after your procedure. Your health care provider may also give you more specific instructions. If you have problems or questions, contact your health care provider. What can I expect after the procedure? After the procedure, it is common to have:  Tiredness.  Forgetfulness about what happened after the procedure.  Impaired judgment for important decisions.  Nausea or vomiting.  Some difficulty with balance. Follow these instructions at home: For the time period you were told by your health care provider:  Rest as needed.  Do not participate in activities where you could fall or become injured.  Do not drive or use machinery.  Do not drink alcohol.  Do not take sleeping pills or medicines that cause drowsiness.  Do not make important decisions or sign legal documents.  Do not take care of children on your own.      Eating and drinking  Follow the diet that is recommended by your health care provider.  Drink enough fluid to keep your urine  pale yellow.  If you vomit: ? Drink water, juice, or soup when you can drink without vomiting. ? Make sure you have little or no nausea before eating solid foods. General instructions  Have a responsible adult stay with you for the time you are told. It is important to have someone help care for you until you are awake and alert.  Take over-the-counter and prescription medicines only as told by your health care provider.  If you have sleep apnea, surgery and certain medicines can increase your risk for breathing problems. Follow instructions from your health care provider about wearing your sleep device: ? Anytime you are sleeping, including during daytime naps. ? While taking prescription pain medicines, sleeping medicines, or medicines that make you drowsy.  Avoid smoking.  Keep all follow-up visits as told by your health care provider. This is important. Contact a health care provider if:  You keep feeling nauseous or you keep vomiting.  You feel light-headed.  You are still sleepy or having trouble with balance after 24 hours.  You develop a rash.  You have a fever.  You have redness or swelling around the IV site. Get help right away if:  You have trouble breathing.  You have new-onset confusion at home. Summary  For several hours after your procedure, you may feel tired. You may also be forgetful and have poor judgment.  Have a responsible adult stay with you for the time you are told. It is important to have someone help care for you until you are awake and alert.  Rest as told. Do not drive or operate machinery. Do not drink alcohol or take sleeping pills.  Get help right away if you have trouble breathing, or if you suddenly become confused. This information is not intended to replace advice given to you by your health care provider. Make sure you discuss any questions you have with your health care provider. Document Revised: 03/03/2020 Document Reviewed:  05/21/2019 Elsevier Patient Education  2021 [employer].

## 2020-11-04 NOTE — Anesthesia Preprocedure Evaluation (Signed)
Anesthesia Evaluation  Patient identified by MRN, date of birth, ID band Patient awake    Reviewed: Allergy & Precautions, NPO status , Patient's Chart, lab work & pertinent test results  History of Anesthesia Complications (+) history of anesthetic complications  Airway Mallampati: II  TM Distance: >3 FB Neck ROM: Full    Dental  (+) Dental Advisory Given, Teeth Intact   Pulmonary asthma , former smoker,    Pulmonary exam normal breath sounds clear to auscultation       Cardiovascular Exercise Tolerance: Good +CHF  Normal cardiovascular exam+ dysrhythmias  Rhythm:Regular Rate:Normal  08-Feb-2020 15:25:45 [facility] ROUTINE RECORD Normal sinus rhythm Minimal voltage criteria for LVH, may be normal variant ( R in aVL ) Borderline ECG Confirmed by Asencion Noble [phone]) on 02/08/2020 7:52:41 PM   Neuro/Psych negative neurological ROS  negative psych ROS   GI/Hepatic hiatal hernia, GERD  Medicated and Controlled,(+) Cirrhosis   Esophageal Varices    , Hepatitis -  Endo/Other  diabetes, Well Controlled, Type 2, Oral Hypoglycemic AgentsHyperthyroidism   Renal/GU      Musculoskeletal  (+) Arthritis ,   Abdominal   Peds  Hematology  (+) Blood dyscrasia (thrombocytopenia), anemia ,   Anesthesia Other Findings   Reproductive/Obstetrics                             Anesthesia Physical Anesthesia Plan  ASA: III  Anesthesia Plan: General   Post-op Pain Management:    Induction: Intravenous  PONV Risk Score and Plan: Propofol infusion  Airway Management Planned: Nasal Cannula and Natural Airway  Additional Equipment:   Intra-op Plan:   Post-operative Plan:   Informed Consent: I have reviewed the patients History and Physical, chart, labs and discussed the procedure including the risks, benefits and alternatives for the proposed anesthesia with the patient or  authorized representative who has indicated his/her understanding and acceptance.     Dental advisory given  Plan Discussed with: CRNA and Surgeon  Anesthesia Plan Comments:         Anesthesia Quick Evaluation

## 2020-11-04 NOTE — Transfer of Care (Signed)
Immediate Anesthesia Transfer of Care Note  Patient: Treasa School  Procedure(s) Performed: ESOPHAGOGASTRODUODENOSCOPY (EGD) WITH PROPOFOL (N/A )  Patient Location: Short Stay  Anesthesia Type:General  Level of Consciousness: awake  Airway & Oxygen Therapy: Patient Spontanous Breathing  Post-op Assessment: Report given to RN  Post vital signs: Reviewed  Last Vitals:  Vitals Value Taken Time  BP 112/75 11/04/20 1037  Temp 36.7 C 11/04/20 1037  Pulse 79 11/04/20 1037  Resp 17 11/04/20 1037  SpO2 98 % 11/04/20 1037    Last Pain:  Vitals:   11/04/20 1037  TempSrc: Oral  PainSc: 0-No pain      Patients Stated Pain Goal: 7 (41/99/14 4458)  Complications: No complications documented.

## 2020-11-04 NOTE — Anesthesia Postprocedure Evaluation (Signed)
Anesthesia Post Note  Patient: Stephen Burch  Procedure(s) Performed: ESOPHAGOGASTRODUODENOSCOPY (EGD) WITH PROPOFOL (N/A )  Patient location during evaluation: Short Stay Anesthesia Type: General Level of consciousness: awake and alert Pain management: pain level controlled Vital Signs Assessment: post-procedure vital signs reviewed and stable Respiratory status: spontaneous breathing Cardiovascular status: blood pressure returned to baseline and stable Postop Assessment: no apparent nausea or vomiting Anesthetic complications: no   No complications documented.   Last Vitals:  Vitals:   11/04/20 0848 11/04/20 1037  BP: 127/72 112/75  Pulse:  79  Resp: (!) 8 17  Temp: 36.6 C 36.7 C  SpO2: 98% 98%    Last Pain:  Vitals:   11/04/20 1037  TempSrc: Oral  PainSc: 0-No pain                 Rigby Leonhardt

## 2020-11-04 NOTE — Op Note (Signed)
[facility] Patient Name: Stephen Burch Procedure Date: 11/04/2020 8:16 AM MRN: [id number] Date of Birth: 06-15-1956 Attending MD: Katrinka Blazing ,  CSN: [id number] Age: 65 Admit Type: Outpatient Procedure:                Upper GI endoscopy Indications:              Follow-up of esophageal varices Providers:                Katrinka Blazing, Sheran Fava, Burke Keels, Technician Referring MD:              Medicines:                Monitored Anesthesia Care Complications:            No immediate complications. Estimated Blood Loss:     Estimated blood loss: none. Procedure:                Pre-Anesthesia Assessment:                           - Prior to the procedure, a History and Physical                            was performed, and patient medications, allergies                            and sensitivities were reviewed. The patient's                            tolerance of previous anesthesia was reviewed.                           - The risks and benefits of the procedure and the                            sedation options and risks were discussed with the                            patient. All questions were answered and informed                            consent was obtained.                           After obtaining informed consent, the endoscope was                            passed under direct vision. Throughout the                            procedure, the patient's blood pressure, pulse, and                            oxygen saturations were monitored continuously.The  upper GI endoscopy was accomplished without                            difficulty. The patient tolerated the procedure                            well. The GIF-H190 ([id number]) scope was introduced                            through the mouth, and advanced to the second part                            of duodenum. Scope In: 10:18:06 AM Scope  Out: 10:23:48 AM Total Procedure Duration: 0 hours 5 minutes 42 seconds  Findings:      Grade III varices were found in the lower third of the esophagus.      Type 2 gastroesophageal varices (GOV2, esophageal varices which extend       along the fundus) with no bleeding were found in the cardia and on the       lesser curvature of the stomach. There were no stigmata of recent       bleeding. They were medium in largest diameter.      A single 8 mm sessile polyp with no bleeding and no stigmata of recent       bleeding was found on the posterior wall of the gastric body. It had an       inflammatory appearance.      The examined duodenum was normal. Impression:               - Grade III esophageal varices.                           - Type 2 gastroesophageal varices (GOV2, esophageal                            varices which extend along the fundus), without                            bleeding.                           - A single gastric polyp.                           - Normal examined duodenum.                           - No specimens collected. Moderate Sedation:      Per Anesthesia Care Recommendation:           - Discharge patient to home (ambulatory).                           - Resume previous diet.                           - Repeat upper endoscopy in 6 months for  surveillance.                           - Start Coreg 3.125 mg twice a day                           - Return to clinic in 3 months to assess HR/BP and                            need to uptitrate medication. Procedure Code(s):        --- Professional ---                           [phone], Esophagogastroduodenoscopy, flexible,                            transoral; diagnostic, including collection of                            specimen(s) by brushing or washing, when performed                            (separate procedure) Diagnosis Code(s):        --- Professional ---                            I85.00, Esophageal varices without bleeding                           I86.4, Gastric varices                           K31.7, Polyp of stomach and duodenum CPT copyright 2019 American Medical Association. All rights reserved. The codes documented in this report are preliminary and upon coder review may  be revised to meet current compliance requirements. Katrinka Blazing, MD Katrinka Blazing,  11/04/2020 10:33:01 AM This report has been signed electronically. Number of Addenda: 0

## 2020-11-10 ENCOUNTER — Encounter (HOSPITAL_COMMUNITY): Payer: Self-pay | Admitting: Gastroenterology

## 2020-11-15 ENCOUNTER — Other Ambulatory Visit (INDEPENDENT_AMBULATORY_CARE_PROVIDER_SITE_OTHER): Payer: Self-pay | Admitting: Internal Medicine

## 2020-11-21 DIAGNOSIS — H906 Mixed conductive and sensorineural hearing loss, bilateral: Secondary | ICD-10-CM | POA: Diagnosis not present

## 2020-12-01 DIAGNOSIS — H906 Mixed conductive and sensorineural hearing loss, bilateral: Secondary | ICD-10-CM | POA: Diagnosis not present

## 2021-01-03 DIAGNOSIS — H906 Mixed conductive and sensorineural hearing loss, bilateral: Secondary | ICD-10-CM | POA: Diagnosis not present

## 2021-01-25 ENCOUNTER — Encounter: Payer: Self-pay | Admitting: Family Medicine

## 2021-01-25 ENCOUNTER — Ambulatory Visit (INDEPENDENT_AMBULATORY_CARE_PROVIDER_SITE_OTHER): Payer: PPO | Admitting: Family Medicine

## 2021-01-25 ENCOUNTER — Other Ambulatory Visit: Payer: Self-pay

## 2021-01-25 VITALS — BP 117/70 | HR 86 | Temp 98.6°F | Resp 18

## 2021-01-25 DIAGNOSIS — R197 Diarrhea, unspecified: Secondary | ICD-10-CM | POA: Diagnosis not present

## 2021-01-25 DIAGNOSIS — B349 Viral infection, unspecified: Secondary | ICD-10-CM

## 2021-01-25 DIAGNOSIS — R059 Cough, unspecified: Secondary | ICD-10-CM

## 2021-01-25 MED ORDER — BENZONATATE 200 MG PO CAPS: 200.0000 mg | ORAL_CAPSULE | Freq: Two times a day (BID) | ORAL | 0 refills | Status: DC | PRN

## 2021-01-25 NOTE — Patient Instructions (Signed)
Please review the attached list of medications and notify my office if there are any errors.   You can take OTC Imodium if you need something stronger for diarrhea  The cough should slowly improve and go away over the next 2-4 weeks. Call if not continuing to slowly improve

## 2021-01-25 NOTE — Progress Notes (Signed)
Established patient visit   Patient: Stephen Burch   DOB: May 31, 1956   65 y.o. Male  MRN: [id number] Visit Date: 01/25/2021  Today's healthcare provider: Mila Merry, MD   Chief Complaint  Patient presents with   Cough   Subjective    Cough This is a new problem. Episode onset: 2 weeks ago. The cough is Non-productive. Associated symptoms include chills and a fever (up to 100.4; resolved 3 days ago). Pertinent negatives include no chest pain, ear congestion, ear pain, headaches, heartburn, hemoptysis, myalgias, nasal congestion, postnasal drip, rash, rhinorrhea, sore throat, shortness of breath, sweats or wheezing. Treatments tried: OTC Delsym and Tylenol. The treatment provided mild relief.   Has also had diarrhea for the last 4 days, but no abdominal pain, nausea, blood or mucous in stool. Patient took a home COVID test 4 days ago and the result was negative.      Medications: Outpatient Medications Prior to Visit  Medication Sig   glipiZIDE (GLUCOTROL XL) 2.5 MG 24 hr tablet Take 1 tablet by mouth once daily with breakfast (Patient taking differently: Take 2.5 mg by mouth daily with breakfast.)   pantoprazole (PROTONIX) 40 MG tablet Take 1 tablet by mouth once daily   carvedilol (COREG) 3.125 MG tablet Take 1 tablet (3.125 mg total) by mouth 2 (two) times daily. (Patient not taking: Reported on 01/25/2021)   No facility-administered medications prior to visit.    Review of Systems  Constitutional:  Positive for chills and fever (up to 100.4; resolved 3 days ago).  HENT:  Negative for ear pain, postnasal drip, rhinorrhea and sore throat.   Respiratory:  Positive for cough. Negative for hemoptysis, shortness of breath and wheezing.   Cardiovascular:  Negative for chest pain.  Gastrointestinal:  Positive for diarrhea. Negative for heartburn.  Musculoskeletal:  Positive for neck stiffness. Negative for myalgias.  Skin:  Negative for rash.  Neurological:  Negative for  headaches.      Objective    BP 117/70 (BP Location: Left Arm, Patient Position: Sitting, Cuff Size: Normal)   Pulse 86   Temp 98.6 F (37 C) (Oral)   Resp 18   SpO2 98% Comment: room air    Physical Exam   General: Appearance:     Well developed, well nourished male in no acute distress  Eyes:    PERRL, conjunctiva/corneas clear, EOM's intact       Lungs:     Clear to auscultation bilaterally, respirations unlabored  Heart:    Normal heart rate. Normal rhythm. No murmurs, rubs, or gallops.    MS:   All extremities are intact.    Neurologic:   Awake, alert, oriented x 3. No apparent focal neurological defect.       Assessment & Plan     1. Cough Onset 2 weeks ago and improving. Night time cough and cold medication is helping to resut. During the day will try  benzonatate (TESSALON) 200 MG capsule; Take 1 capsule (200 mg total) by mouth 2 (two) times daily as needed for cough.  Dispense: 20 capsule; Refill: 0   2. Diarrhea, unspecified type Likely progression of viral URI to GI track. Symptoms are mild, can take OTC imodium prn and call if not improving over next few days  3. Viral syndrome Not c/w current strain of Covid that is circulating.        The entirety of the information documented in the History of Present Illness, Review of Systems and  Physical Exam were personally obtained by me. Portions of this information were initially documented by the CMA and reviewed by me for thoroughness and accuracy.     Mila Merry, MD  [facility] [phone] (phone) [phone] (fax)  [facility]

## 2021-02-08 ENCOUNTER — Telehealth: Payer: Self-pay | Admitting: *Deleted

## 2021-02-08 NOTE — Chronic Care Management (AMB) (Signed)
Chronic Care Management   Note  02/08/2021 Name: Stephen Burch MRN: [id number] DOB: 07-Apr-1956  Stephen Burch is a 65 y.o. year old male who is a primary care patient of Fisher, Demetrios Isaacs, MD. I reached out to Stephen Burch by phone today in response to a referral sent by Stephen Burch's PCP Malva Limes, MD     Stephen Burch was given information about Chronic Care Management services today including:  CCM service includes personalized support from designated clinical staff supervised by his physician, including individualized plan of care and coordination with other care providers 24/7 contact phone numbers for assistance for urgent and routine care needs. Service will only be billed when office clinical staff spend 20 minutes or more in a month to coordinate care. Only one practitioner may furnish and bill the service in a calendar month. The patient may stop CCM services at any time (effective at the end of the month) by phone call to the office staff. The patient will be responsible for cost sharing (co-pay) of up to 20% of the service fee (after annual deductible is met).  Patient agreed to services and verbal consent obtained.   Follow up plan: Telephone appointment with care management team member scheduled for: 02/28/2021  Burman Nieves, CCMA Care Guide, Embedded Care Coordination [facility]  Care Management  Direct Dial: [phone]

## 2021-02-28 ENCOUNTER — Ambulatory Visit (INDEPENDENT_AMBULATORY_CARE_PROVIDER_SITE_OTHER): Payer: PPO

## 2021-02-28 DIAGNOSIS — E1169 Type 2 diabetes mellitus with other specified complication: Secondary | ICD-10-CM

## 2021-02-28 NOTE — Chronic Care Management (AMB) (Signed)
Chronic Care Management   CCM RN Visit Note  02/28/2021 Name: Stephen Burch MRN: [id number] DOB: 1956/02/26  Subjective: Stephen Burch is a 65 y.o. year old male who is a primary care patient of Fisher, Demetrios Isaacs, MD. The care management team was consulted for assistance with disease management and care coordination needs.    Engaged with patient by telephone for initial visit in response to provider referral for case management and care coordination services.   Consent to Services:  The patient was given the following information about Chronic Care Management services: 1. CCM service includes personalized support from designated clinical staff supervised by the primary care provider, including individualized plan of care and coordination with other care providers 2. 24/7 contact phone numbers for assistance for urgent and routine care needs. 3. Service will only be billed when office clinical staff spend 20 minutes or more in a month to coordinate care. 4. Only one practitioner may furnish and bill the service in a calendar month. 5.The patient may stop CCM services at any time (effective at the end of the month) by phone call to the office staff. 6. The patient will be responsible for cost sharing (co-pay) of up to 20% of the service fee (after annual deductible is met). Patient agreed to services and consent obtained.   Assessment: Review of patient past medical history, allergies, medications, health status, including review of consultants reports, laboratory and other test data, was performed as part of comprehensive evaluation and provision of chronic care management services.   SDOH (Social Determinants of Health) assessments and interventions performed:  SDOH Interventions    Flowsheet Row Most Recent Value  SDOH Interventions   Food Insecurity Interventions Intervention Not Indicated  Transportation Interventions Intervention Not Indicated        CCM Care Plan  Allergies   Allergen Reactions   Asa [Aspirin] Shortness Of Breath    "asthma symptoms"   Penicillins Shortness Of Breath    Has patient had a PCN reaction causing immediate rash, facial/tongue/throat swelling, SOB or lightheadedness with hypotension: Yes Has patient had a PCN reaction causing severe rash involving mucus membranes or skin necrosis: No Has patient had a PCN reaction that required hospitalization No Has patient had a PCN reaction occurring within the last 10 years: No If all of the above answers are "NO", then may proceed with Cephalosporin use. "asthma symptoms" Patient has tolerated cefazolin, ceftriaxo   Vancomycin Anaphylaxis    Immediately following being turned into prone position and Vancomycin administration in the OR patient cardiac arrest w/  Vfib.    Outpatient Encounter Medications as of 02/28/2021  Medication Sig   glipiZIDE (GLUCOTROL XL) 2.5 MG 24 hr tablet Take 1 tablet by mouth once daily with breakfast (Patient taking differently: Take 2.5 mg by mouth daily with breakfast.)   pantoprazole (PROTONIX) 40 MG tablet Take 1 tablet by mouth once daily   benzonatate (TESSALON) 200 MG capsule Take 1 capsule (200 mg total) by mouth 2 (two) times daily as needed for cough.   carvedilol (COREG) 3.125 MG tablet Take 1 tablet (3.125 mg total) by mouth 2 (two) times daily. (Patient not taking: No sig reported)   No facility-administered encounter medications on file as of 02/28/2021.    Patient Active Problem List   Diagnosis Date Noted   Atherosclerosis of aorta (HCC) 03/01/2020   History of adenomatous polyp of colon 02/11/2020   Diarrhea 01/19/2020   Hyperthyroidism 09/15/2019   Barrett's esophagus  Ground glass opacity present on imaging of lung 05/06/2019   Multiple lung nodules on CT 05/06/2019   Other pancytopenia (HCC) 12/31/2018   Other neutropenia (HCC)    History of atrial fibrillation 12/20/2018   Abnormal liver function 12/20/2018   Scapholunate advanced  collapse of left wrist 08/27/2017   Unsteadiness on feet    Knee pain, acute    Thrombocytopenia (HCC)    Hypomagnesemia    Vitiligo 08/22/2015   AD (atopic dermatitis) 08/22/2015   Osteoarthritis of right knee 08/22/2015   Liver cirrhosis secondary to NASH (nonalcoholic steatohepatitis) (HCC) 08/22/2015   T2DM (type 2 diabetes mellitus) (HCC) 08/22/2015   GERD (gastroesophageal reflux disease) 08/22/2015   Ganglion of left wrist 08/22/2015   ED (erectile dysfunction) of organic origin 08/22/2015   Asthma 08/22/2015   Dysplastic nodule of liver 08/22/2015   Baker cyst 09/06/2014   H/O total knee replacement 08/05/2014   Bilateral inguinal hernia 08/01/2012   Splenomegaly 08/01/2012   Gallstones 08/01/2012    Conditions to be addressed/monitored:DMII Patient Care Plan: Diabetes Type 2 (Adult)     Problem Identified: Glycemic Management (Diabetes, Type 2)      Long-Range Goal: Glycemic Management Optimized   Start Date: 02/28/2021  Expected End Date: 05/29/2021  Priority: Medium  Note:   Objective:  Lab Results  Component Value Date   HGBA1C 6.3 (A) 05/16/2020   Lab Results  Component Value Date   CREATININE 0.92 10/26/2020   CREATININE 0.93 02/08/2020   CREATININE 0.98 07/30/2019   No results found for: EGFR  Current Barriers:  Chronic Disease Management support and educational needs related to Diabetes.  Case Manager Clinical Goal(s):  Over the next 90 days, patient will demonstrate adherence to prescribed treatment plan for diabetes self care/management as evidenced by taking medications as prescribed , daily monitoring and recording of CBG's and adherence to ADA/ carb modified diet.   Interventions:  Collaboration with Malva Limes, MD regarding development and update of comprehensive plan of care as evidenced by provider attestation and co-signature Inter-disciplinary care team collaboration (see longitudinal plan of care) Reviewed medications. Reports  excellent compliance with medications. Advised to continue taking as prescribed and update the care management team with concerns regarding prescription cost. Provided information regarding importance of consistent blood glucose monitoring. Reports fasting readings have ranged from the 120's to 160's. Encouraged to continue monitoring and maintain a log. Reviewed s/sx of hypoglycemia and hyperglycemia along with recommended interventions. Discussed nutritional intake and importance of complying with carb modified/ADA diet. Reports significant improvements with nutritional intake. Attempting to monitor carbs and avoid concentrated sugars.  Discussed and offered referrals for available Diabetes education classes. Current A1C is within goal. Declined need for additional diabetes resources. Discussed importance of completing recommended DM preventive care. Reports completing foot care and foot exams as recommended. Reports completing eye exam within the last year.     Self-Care/Patient Goals: Self administer medications as prescribed Attend all scheduled provider appointments Monitor blood sugars and record readings Adhere to prescribed ADA/carb modified Continue moderate exercise as tolerated Contact the clinic with new questions and concerns as needed   Follow Up Plan:  Will follow up in three months       PLAN A member of the care management team will follow up with Stephen Burch in three months.   [facility] Care Management [facility] [phone]

## 2021-02-28 NOTE — Patient Instructions (Addendum)
Thank you for allowing the Chronic Care Management team to participate in your care.    Patient Care Plan: Diabetes Type 2 (Adult)     Problem Identified: Glycemic Management (Diabetes, Type 2)      Long-Range Goal: Glycemic Management Optimized   Start Date: 02/28/2021  Expected End Date: 05/29/2021  Priority: Medium  Note:   Objective:  Lab Results  Component Value Date   HGBA1C 6.3 (A) 05/16/2020   Lab Results  Component Value Date   CREATININE 0.92 10/26/2020   CREATININE 0.93 02/08/2020   CREATININE 0.98 07/30/2019   No results found for: EGFR  Current Barriers:  Chronic Disease Management support and educational needs related to Diabetes.  Case Manager Clinical Goal(s):  Over the next 90 days, patient will demonstrate adherence to prescribed treatment plan for diabetes self care/management as evidenced by taking medications as prescribed , daily monitoring and recording of CBG's and adherence to ADA/ carb modified diet.   Interventions:  Collaboration with Stephen Sons, MD regarding development and update of comprehensive plan of care as evidenced by provider attestation and co-signature Inter-disciplinary care team collaboration (see longitudinal plan of care) Reviewed medications. Reports excellent compliance with medications. Advised to continue taking as prescribed and update the care management team with concerns regarding prescription cost. Provided information regarding importance of consistent blood glucose monitoring. Reports fasting readings have ranged from the 120's to 160's. Encouraged to continue monitoring and maintain a log. Reviewed s/sx of hypoglycemia and hyperglycemia along with recommended interventions. Discussed nutritional intake and importance of complying with carb modified/ADA diet. Reports significant improvements with nutritional intake. Attempting to monitor carbs and avoid concentrated sugars.  Discussed and offered referrals for  available Diabetes education classes. Current A1C is within goal. Declined need for additional diabetes resources. Discussed importance of completing recommended DM preventive care. Reports completing foot care and foot exams as recommended. Reports completing eye exam within the last year.     Self-Care/Patient Goals: Self administer medications as prescribed Attend all scheduled provider appointments Monitor blood sugars and record readings Adhere to prescribed ADA/carb modified Continue moderate exercise as tolerated Contact the clinic with new questions and concerns as needed   Follow Up Plan:  Will follow up in three months        Stephen Burch verbalized understanding of the information discussed during the telephonic outreach. Declined need for mailed/printed instructions. A member of the care management team will follow up in three months.   Stephen Burch [facility] Care Management [facility] [phone]

## 2021-03-01 DIAGNOSIS — E0590 Thyrotoxicosis, unspecified without thyrotoxic crisis or storm: Secondary | ICD-10-CM | POA: Diagnosis not present

## 2021-03-01 DIAGNOSIS — D729 Disorder of white blood cells, unspecified: Secondary | ICD-10-CM | POA: Diagnosis not present

## 2021-03-01 DIAGNOSIS — E1165 Type 2 diabetes mellitus with hyperglycemia: Secondary | ICD-10-CM | POA: Diagnosis not present

## 2021-03-01 DIAGNOSIS — E1169 Type 2 diabetes mellitus with other specified complication: Secondary | ICD-10-CM

## 2021-03-01 DIAGNOSIS — E538 Deficiency of other specified B group vitamins: Secondary | ICD-10-CM | POA: Diagnosis not present

## 2021-03-01 DIAGNOSIS — R5383 Other fatigue: Secondary | ICD-10-CM | POA: Diagnosis not present

## 2021-03-18 ENCOUNTER — Other Ambulatory Visit: Payer: Self-pay | Admitting: Family Medicine

## 2021-03-28 ENCOUNTER — Encounter (INDEPENDENT_AMBULATORY_CARE_PROVIDER_SITE_OTHER): Payer: Self-pay | Admitting: *Deleted

## 2021-04-10 ENCOUNTER — Telehealth: Payer: Self-pay | Admitting: Family Medicine

## 2021-04-10 ENCOUNTER — Ambulatory Visit: Payer: Self-pay | Admitting: *Deleted

## 2021-04-10 NOTE — Telephone Encounter (Signed)
Call to patient- attempted triage but patient is at store and request call back- 30-45 minutes.

## 2021-04-10 NOTE — Telephone Encounter (Unsigned)
Copied from [place] [id number]. Topic: Appointment Scheduling - Scheduling Inquiry for Clinic >> Apr 10, 2021  9:20 AM Yvette Rack wrote: Reason for CRM: Pt requests appt with Dr. Caryn Section for a cough he has not been able to get rid of but there were no appts available this month. Pt requests call back. Cb# [phone]

## 2021-04-10 NOTE — Telephone Encounter (Signed)
Attempted to call patient- left message to call back- Triage call opened and in call back que

## 2021-04-10 NOTE — Telephone Encounter (Signed)
Copied from [place] [phone]. Topic: Appointment Scheduling - Scheduling Inquiry for Clinic >> Apr 10, 2021  9:20 AM Yvette Rack wrote: Reason for CRM: Pt requests appt with Dr. Caryn Section for a cough he has not been able to get rid of but there were no appts available this month. Pt requests call back. Cb# [phone]  Attempted to call patient - left message on VM to call office.

## 2021-04-10 NOTE — Telephone Encounter (Signed)
Patient called, left VM to return the call to the office to discuss symptoms with a nurse. Unable to reach patient after 3 attempts by [facility] NT, routing to the provider for resolution per protocol.    Copied from [place] [id number]. Topic: Appointment Scheduling - Scheduling Inquiry for Clinic >> Apr 10, 2021  9:20 AM Yvette Rack wrote: Reason for CRM: Pt requests appt with Dr. Caryn Section for a cough he has not been able to get rid of but there were no appts available this month. Pt requests call back. Cb# [phone]

## 2021-04-12 ENCOUNTER — Ambulatory Visit: Payer: Self-pay | Admitting: *Deleted

## 2021-04-12 ENCOUNTER — Other Ambulatory Visit (INDEPENDENT_AMBULATORY_CARE_PROVIDER_SITE_OTHER): Payer: Self-pay

## 2021-04-12 DIAGNOSIS — R053 Chronic cough: Secondary | ICD-10-CM

## 2021-04-12 DIAGNOSIS — K7581 Nonalcoholic steatohepatitis (NASH): Secondary | ICD-10-CM

## 2021-04-12 DIAGNOSIS — K7460 Unspecified cirrhosis of liver: Secondary | ICD-10-CM

## 2021-04-12 MED ORDER — MONTELUKAST SODIUM 10 MG PO TABS: 10.0000 mg | ORAL_TABLET | Freq: Every day | ORAL | 3 refills | Status: DC

## 2021-04-12 NOTE — Telephone Encounter (Addendum)
Patient advised and verbalized understanding. Patient agrees to have imaging. Please schedule.

## 2021-04-12 NOTE — Telephone Encounter (Signed)
Have sent prescription for montelukast to [facility] which usually helps with chronic cough.  He has some lung nodules on chest CT last year and recommend repeat chest xr now for follow up and also due to persistent cough. Will place order.

## 2021-04-12 NOTE — Telephone Encounter (Signed)
Pt saw Dr. Caryn Section 01/25/21 for cough. States "Little better, still there." Reports has taken all meds recommended, "Helped a little." States cough is worse in afternoons and nights, varies throughout day. "At times in mornings too."  Denies fever, no SOB, no new meds.  Uses inhaler at HS, "All this helps but not for long." States cough is mostly dry, hacking cough. Occasional phlegm, clear white. Also reports inspiratory wheezing at times. No availability within protocol timeframe. Pt declines potential appt with provider at [facility]. Assured pt NT would route to practice for PCPs review and final disposition.  Advised UC for worsening symptoms. Pt verbalizes understanding.    Reason for Disposition  Cough has been present for > 3 weeks  Answer Assessment - Initial Assessment Questions 1. ONSET: "When did the cough begin?"      Over a month ago 2. SEVERITY: "How bad is the cough today?"      *No Answer* 3. SPUTUM: "Describe the color of your sputum" (none, dry cough; clear, white, yellow, green) whitish    4. HEMOPTYSIS: "Are you coughing up any blood?" If so ask: "How much?" (flecks, streaks, tablespoons, etc.)     *No Answer* 5. DIFFICULTY BREATHING: "Are you having difficulty breathing?" If Yes, ask: "How bad is it?" (e.g., mild, moderate, severe)    - MILD: No SOB at rest, mild SOB with walking, speaks normally in sentences, can lie down, no retractions, pulse < 100.    - MODERATE: SOB at rest, SOB with minimal exertion and prefers to sit, cannot lie down flat, speaks in phrases, mild retractions, audible wheezing, pulse 100-120.    - SEVERE: Very SOB at rest, speaks in single words, struggling to breathe, sitting hunched forward, retractions, pulse > 120       6. FEVER: "Do you have a fever?" If Yes, ask: "What is your temperature, how was it measured, and when did it start?"     no 7. CARDIAC HISTORY: "Do you have any history of heart disease?" (e.g., heart attack, congestive heart  failure)      *No Answer* 8. LUNG HISTORY: "Do you have any history of lung disease?"  (e.g., pulmonary embolus, asthma, emphysema)     *No Answer* 9. PE RISK FACTORS: "Do you have a history of blood clots?" (or: recent major surgery, recent prolonged travel, bedridden)     *No Answer* 10. OTHER SYMPTOMS: "Do you have any other symptoms?" (e.g., runny nose, wheezing, chest pain)  Protocols used: Cough - Acute Non-Productive-A-AH

## 2021-04-13 ENCOUNTER — Telehealth: Payer: Self-pay

## 2021-04-13 NOTE — Telephone Encounter (Signed)
Copied from [place] [id number]. Topic: General - Other >> Apr 13, 2021  8:57 AM Parke Poisson wrote: Reason for CRM:Pt wants to know if CT that was ordered for him can cover the liver . Please advise,Thanks

## 2021-04-14 NOTE — Telephone Encounter (Signed)
No, the chest CT is optimized to look at lung nodules. The ultrasound scheduled by his GI is better for the liver.

## 2021-04-14 NOTE — Telephone Encounter (Signed)
Pt advised.   Thanks,   -Jace Dowe  

## 2021-04-20 ENCOUNTER — Ambulatory Visit (HOSPITAL_COMMUNITY)
Admission: RE | Admit: 2021-04-20 | Discharge: 2021-04-20 | Disposition: A | Discharge status: Home / Self Care | Payer: PPO | Source: Ambulatory Visit | Attending: Internal Medicine | Admitting: Internal Medicine

## 2021-04-20 ENCOUNTER — Other Ambulatory Visit: Payer: Self-pay

## 2021-04-20 DIAGNOSIS — K7581 Nonalcoholic steatohepatitis (NASH): Secondary | ICD-10-CM | POA: Insufficient documentation

## 2021-04-20 DIAGNOSIS — K824 Cholesterolosis of gallbladder: Secondary | ICD-10-CM | POA: Diagnosis not present

## 2021-04-20 DIAGNOSIS — K7460 Unspecified cirrhosis of liver: Secondary | ICD-10-CM | POA: Insufficient documentation

## 2021-04-25 ENCOUNTER — Other Ambulatory Visit: Payer: Self-pay | Admitting: Family Medicine

## 2021-04-25 MED ORDER — PANTOPRAZOLE SODIUM 40 MG PO TBEC: 40.0000 mg | DELAYED_RELEASE_TABLET | Freq: Every day | ORAL | 3 refills | Status: DC

## 2021-04-25 NOTE — Telephone Encounter (Signed)
Prescriber not at this practice- needs to go to [facility] for GI diseases.

## 2021-04-25 NOTE — Telephone Encounter (Signed)
Medication Refill - Medication: pantoprazole (PROTONIX) 40 MG tablet   Has the patient contacted their pharmacy? Yes  (Agent: If no, request that the patient contact the pharmacy for the refill. If patient does not wish to contact the pharmacy document the reason why and proceed with request.) (Agent: If yes, when and what did the pharmacy advise?)no refills    Preferred Pharmacy (with phone number or street name):  [place], [place] - [id number] [place] [address] [place] Phone:  [phone]  Fax:  [phone]     Has the patient been seen for an appointment in the last year OR does the patient have an upcoming appointment? Yes.    Agent: Please be advised that RX refills may take up to 3 business days. We ask that you follow-up with your pharmacy.

## 2021-04-26 ENCOUNTER — Encounter (INDEPENDENT_AMBULATORY_CARE_PROVIDER_SITE_OTHER): Payer: Self-pay | Admitting: *Deleted

## 2021-04-27 ENCOUNTER — Encounter (INDEPENDENT_AMBULATORY_CARE_PROVIDER_SITE_OTHER): Payer: Self-pay | Admitting: Gastroenterology

## 2021-04-27 ENCOUNTER — Ambulatory Visit (INDEPENDENT_AMBULATORY_CARE_PROVIDER_SITE_OTHER): Payer: PPO | Admitting: Gastroenterology

## 2021-04-27 ENCOUNTER — Other Ambulatory Visit: Payer: Self-pay

## 2021-04-27 VITALS — BP 129/80 | HR 82 | Temp 98.0°F | Ht 72.0 in | Wt 198.5 lb

## 2021-04-27 DIAGNOSIS — I8500 Esophageal varices without bleeding: Secondary | ICD-10-CM | POA: Insufficient documentation

## 2021-04-27 DIAGNOSIS — K7581 Nonalcoholic steatohepatitis (NASH): Secondary | ICD-10-CM

## 2021-04-27 DIAGNOSIS — I864 Gastric varices: Secondary | ICD-10-CM | POA: Diagnosis not present

## 2021-04-27 DIAGNOSIS — I8510 Secondary esophageal varices without bleeding: Secondary | ICD-10-CM

## 2021-04-27 DIAGNOSIS — K2270 Barrett's esophagus without dysplasia: Secondary | ICD-10-CM | POA: Diagnosis not present

## 2021-04-27 DIAGNOSIS — K7460 Unspecified cirrhosis of liver: Secondary | ICD-10-CM | POA: Diagnosis not present

## 2021-04-27 MED ORDER — NADOLOL 20 MG PO TABS: 20.0000 mg | ORAL_TABLET | Freq: Every evening | ORAL | 1 refills | Status: DC

## 2021-04-27 NOTE — Patient Instructions (Addendum)
Perform blood workup Start nadolol 20 mg every night - Reduce salt intake to <2 g per day - Can take Tylenol max of 2 g per day (650 mg q8h) for pain - Avoid NSAIDs for pain - Avoid eating raw oysters/shellfish - Ensure every night before going to sleep

## 2021-04-27 NOTE — Progress Notes (Signed)
Stephen Burch, M.D. Gastroenterology & Hepatology [facility] [address] [place], [place] [address]  Primary Care Physician: Stephen Burch, [place] [address]  I will communicate my assessment and recommendations to the referring MD via EMR.  Problems: NASH cirrhosis Grade III esophageal varices Type II GOV varices GERD BE without dysplasia 6 mm gallbladder polyp  History of Present Illness: Stephen Burch is a 65 y.o. male with past medical history of NASH cirrhosis complicated by grade 3 esophageal varices, Type II GOV varices, GERD complicated by Barrett's esophagus without dysplasia, history of intraoperatively cardiac arrest,  who presents for follow up of liver cirrhosis  The patient was last seen on 10/04/2020. At that time, the patient underwent surveillance blood work-up including MELD labs and AFP which were unremarkable.  Liver ultrasound at that time did not show any masses.  The patient underwent an EGD on 11/04/2020 as described below, was found to have grade 3 esophageal varices and type II gastroesophageal varices for which he was started on Coreg 3.125 mg every day.  However the patient states that he did not tolerate the side effects of the medication and stopped on his own (he does not remember exactly what side effects he had).  Patient states feeling well and denies having any complaints.  The patient denies having any nausea, vomiting, fever, chills, hematochezia, melena, hematemesis, abdominal distention/swelling, abdominal pain, diarrhea, jaundice, pruritus or weight loss.  He reports that he has only felt more tired than usual, but this has been going on for almost a year.  Most recent hemoglobin on 10/26/2020 was 14.2.  Cirrhosis related questions: Hematemesis/coffee ground emesis: No History of variceal bleeding: No Abdominal pain: No Abdominal distention/worsening  ascitesNo Fever/chills: No Episodes of confusion/disorientation: No Taking diuretics?: No Prior history of banding?: No Prior episodes of SBP: No Last time liver imaging was performed:04/2021 -negative for any liver masses, presence of a gallbladder polyp of 6 mm MELD score: 10 on 09/2020   Last EGD: 11/04/2020  - Grade III esophageal varices. - Type 2 gastroesophageal varices (GOV2, esophageal varices which extend along the fundus), without bleeding. - A single gastric polyp.  Last Colonoscopy:  Past Medical History: Past Medical History:  Diagnosis Date   Acute systolic (congestive) heart failure ([place]) 04/30/2018   Arthritis    knees, wrists   Barrett's esophagus    Cardiac arrest ([place]) 0/53/9767   Complication of anesthesia    02-22-2016 intraop cardiac arrest immediately after vancomyocin administration , pt cardiac arrest w/ Vfib,  please refer to anesthesia record in epic   Dysrhythmia    Afib 12/2018 - converted to NSR   Esophageal varices determined by endoscopy ([place]) 11/2016   grade 1   GERD (gastroesophageal reflux disease)    Hiatal hernia    History of adenomatous polyp of colon    History of asthma    as child   History of DVT (deep vein thrombosis)    right lower extremitty post-op right total knee surgery 09/ 2010,  treated w/ coumadin   History of esophageal dilatation 07/1998   for schatzski ring   History of kidney stones    History of staph infection 04/2016   MSSA infection of right total knee w/ sepsis   Hx of cardiac arrest    02-22-2016  intraoperative, immediately following moving pt into prone position and vancomyocin administration, cardiac arrest w/ Vfib (referred to anesthesia record in epic)  pt extubated himself next day   Infection of prosthetic right knee joint ([place])    Liver cirrhosis secondary to NASH (nonalcoholic steatohepatitis) ([place])    NAFLD-- followed by dr Laural Golden---  liver bx 09-30-2012  mild portal and focal sinusoidal fibrosis    Pre-diabetes    Pyogenic arthritis of right knee joint ([place]) 04/02/2016   Scapholunate advanced collapse of left wrist    deformity   Shock circulatory ([place])    Staphylococcus aureus bacteremia with sepsis ([place])    Thrombocytopenia ([place])    10-09-2017 per pt his platelets have always been low , followed by pcp, never been referred to hematologist    Past Surgical History: Past Surgical History:  Procedure Laterality Date   APPLICATION OF WOUND VAC Right 02/22/2016   Procedure: APPLICATION OF WOUND VAC;  Surgeon: Vickey Huger, MD;  Location: [place];  Service: Orthopedics;  Laterality: Right;   BIOPSY  02/10/2020   Procedure: BIOPSY;  Surgeon: Rogene Houston, MD;  [facility];  Service: Endoscopy;;   CARDIAC CATHETERIZATION N/A 02/24/2016   Procedure: Left Heart Cath and Coronary Angiography;  Surgeon: Burnell Blanks, MD;  Location: [place] [facility];  Service: Cardiovascular;  Laterality: N/A;  No angiographic evidence of CAD,  normal LVSF, ef 50-55%   CARPECTOMY Left 10/17/2017   Procedure: LEFT WRIST PROXIMAL ROW CARPECTOMY WITH POSTEROR IMBROSSEOUS NERVE EXCISION;  Surgeon: Charlotte Crumb, MD;  Location: [place];  Service: Orthopedics;  Laterality: Left;  AXILLARY BLOCK   CARPECTOMY WITH RADIAL STYLOIDECTOMY Left 02/11/2019   Procedure: LEFT WRIST RADIAL STYLOIDECTOMY;  Surgeon: Charlotte Crumb, MD;  Location: [place];  Service: Orthopedics;  Laterality: Left;   COLONOSCOPY     COLONOSCOPY WITH PROPOFOL N/A 02/10/2020   Procedure: COLONOSCOPY WITH PROPOFOL;  Surgeon: Rogene Houston, MD;  [facility];  Service: Endoscopy;  Laterality: N/A;  955   EAR CYST EXCISION Right 09/06/2014   Procedure: OPEN EXCISION BAKER'S CYST RIGHT KNEE;  Surgeon: Vickey Huger, MD;  Location: [place];  Service: Orthopedics;  Laterality: Right;   ESOPHAGEAL BANDING  06/30/2020   Procedure: ESOPHAGEAL BANDING;  Surgeon: Rogene Houston, MD;  [facility];   Service: Endoscopy;;   ESOPHAGEAL DILATION  1996/ 2000   ESOPHAGOGASTRODUODENOSCOPY N/A 09/17/2012   Procedure: ESOPHAGOGASTRODUODENOSCOPY (EGD);  Surgeon: Rogene Houston, MD;  [facility];  Service: Endoscopy;  Laterality: N/A;  200   ESOPHAGOGASTRODUODENOSCOPY N/A 12/15/2015   Procedure: ESOPHAGOGASTRODUODENOSCOPY (EGD);  Surgeon: Rogene Houston, MD;  [facility];  Service: Endoscopy;  Laterality: N/A;  1245   ESOPHAGOGASTRODUODENOSCOPY N/A 12/17/2016   Procedure: ESOPHAGOGASTRODUODENOSCOPY (EGD);  Surgeon: Rogene Houston, MD;  [facility];  Service: Endoscopy;  Laterality: N/A;  210   ESOPHAGOGASTRODUODENOSCOPY N/A 06/30/2020   Procedure: ESOPHAGOGASTRODUODENOSCOPY (EGD);  Surgeon: Rogene Houston, MD;  [facility];  Service: Endoscopy;  Laterality: N/A;  11:15   ESOPHAGOGASTRODUODENOSCOPY (EGD) WITH PROPOFOL N/A 02/10/2020   Procedure: ESOPHAGOGASTRODUODENOSCOPY (EGD) WITH PROPOFOL;  Surgeon: Rogene Houston, MD;  [facility];  Service: Endoscopy;  Laterality: N/A;   ESOPHAGOGASTRODUODENOSCOPY (EGD) WITH PROPOFOL N/A 11/04/2020   Procedure: ESOPHAGOGASTRODUODENOSCOPY (EGD) WITH PROPOFOL;  Surgeon: Harvel Quale, MD;  [facility];  Service: Gastroenterology;  Laterality: N/A;  9:45   I & D KNEE WITH POLY EXCHANGE Right 04/02/2016   Procedure: IRRIGATION AND DEBRIDEMENT RIGHT KNEE WITH POLY EXCHANGE;  Surgeon: Vickey Huger, MD;  Location: [place];  Service: Orthopedics;  Laterality: Right;   INCISION AND DRAINAGE HEMATOMA POST LEFT  TOTAL KNEE  2012   INGUINAL HERNIA REPAIR Bilateral 05/22/2013   Procedure: REPAIR OF RECURRENT INCARCERATED INGUINAL HERNIA WITH MESH RIGHT SIDE,  REPAIR OF RECURRENT INGUINAL HERNIA WITH MESH LEFT SIDE;  Surgeon: [name], MD;  Location: [place];  Service: General;  Laterality: Bilateral;   INGUINAL HERNIA REPAIR Bilateral 11/1997   INSERTION OF MESH Bilateral 05/22/2013    Procedure: INSERTION OF MESH;  Surgeon: [name], MD;  Location: [facility];  Service: General;  Laterality: Bilateral;   IRRIGATION AND DEBRIDEMENT KNEE Right 02/22/2016   Procedure: IRRIGATION AND DEBRIDEMENT KNEE;  Surgeon: Vickey Huger, MD;  Location: [place];  Service: Orthopedics;  Laterality: Right;   Irrigation and Debridement right knee  03/23/2009   Dr. Sabra Heck, [place] ARTHROSCOPY W/ SYNOVECTOMY Right 01/30/2010   AND DEBRIDEMENT OF HETEROTOPIC   LIVER BIOPSY  09/30/2012   REVISION TOTAL KNEE ARTHROPLASTY Left fall 2012   TONSILLECTOMY  child   TOTAL KNEE ARTHROPLASTY Bilateral right 09/ 2010/  left 09-18-2010   TRANSTHORACIC ECHOCARDIOGRAM  02/22/2016   ef 30-35% (per cardiac cath 02-24-2016 normal), severe hypokinesis of the mid-apicalanteroseptal and apical myocardium,  grade 1 diastolic dysfunction/ trivial PR and TR   TRIGGER FINGER RELEASE Left 02/11/2019   Procedure: LEFT WRIST STENOSING TENOSYNOVITIS RELEASE;  Surgeon: Charlotte Crumb, MD;  Location: [place];  Service: Orthopedics;  Laterality: Left;  MAC WITH AXILLARY BLOCK   WRIST ARTHROPLASTY Left 10/17/2017   Procedure: CAPITATE RESURFACING ARTHROPLASTY;  Surgeon: Charlotte Crumb, MD;  Location: [facility];  Service: Orthopedics;  Laterality: Left;    Family History: Family History  Problem Relation Age of Onset   Arthritis Father        died age 26   GI Bleed Father        upper GI Bleed, non-ETOH cirrhosis   Arthritis Brother    Diabetes Brother        type 2   Diabetes Paternal Uncle        type 2   Heart attack Maternal Grandmother    Heart attack Maternal Grandfather    Colon cancer Neg Hx    Colon polyps Neg Hx     Social History: Social History   Tobacco Use  Smoking Status Former   Packs/day: 0.25   Years: 3.00   Pack years: 0.75   Types: Cigarettes   Quit date: 10/10/1974   Years since quitting: 46.5  Smokeless Tobacco Never   Social History   Substance and Sexual  Activity  Alcohol Use Not Currently   Social History   Substance and Sexual Activity  Drug Use No    Allergies: Allergies  Allergen Reactions   Asa [Aspirin] Shortness Of Breath    "asthma symptoms"   Penicillins Shortness Of Breath    Has patient had a PCN reaction causing immediate rash, facial/tongue/throat swelling, SOB or lightheadedness with hypotension: Yes Has patient had a PCN reaction causing severe rash involving mucus membranes or skin necrosis: No Has patient had a PCN reaction that required hospitalization No Has patient had a PCN reaction occurring within the last 10 years: No If all of the above answers are "NO", then may proceed with Cephalosporin use. "asthma symptoms" Patient has tolerated cefazolin, ceftriaxo   Vancomycin Anaphylaxis    Immediately following being turned into prone position and Vancomycin administration in the OR patient cardiac  arrest w/  Vfib.    Medications: Current Outpatient Medications  Medication Sig Dispense Refill   EPINEPHrine (PRIMATENE MIST) 0.125 MG/ACT AERO Inhale into the lungs. One puff two to three times per week as needed.     glipiZIDE (GLUCOTROL XL) 2.5 MG 24 hr tablet Take 1 tablet by mouth once daily with breakfast 90 tablet 0   methimazole (TAPAZOLE) 10 MG tablet Take 10 mg by mouth daily.     pantoprazole (PROTONIX) 40 MG tablet Take 1 tablet (40 mg total) by mouth daily before breakfast. 90 tablet 3   No current facility-administered medications for this visit.    Review of Systems: GENERAL: negative for malaise, night sweats HEENT: No changes in hearing or vision, no nose bleeds or other nasal problems. NECK: Negative for lumps, goiter, pain and significant neck swelling RESPIRATORY: Negative for cough, wheezing CARDIOVASCULAR: Negative for chest pain, leg swelling, palpitations, orthopnea GI: SEE HPI MUSCULOSKELETAL: Negative for joint pain or swelling, back pain, and muscle pain. SKIN: Negative for lesions,  rash PSYCH: Negative for sleep disturbance, mood disorder and recent psychosocial stressors. HEMATOLOGY Negative for prolonged bleeding, bruising easily, and swollen nodes. ENDOCRINE: Negative for cold or heat intolerance, polyuria, polydipsia and goiter. NEURO: negative for tremor, gait imbalance, syncope and seizures. The remainder of the review of systems is noncontributory.   Physical Exam: BP 129/80 (BP Location: Left Arm, Patient Position: Sitting, Cuff Size: Large)   Pulse 82   Temp 98 F (36.7 C) (Oral)   Ht 6' (1.829 m)   Wt 198 lb 8 oz (90 kg)   BMI 26.92 kg/m  GENERAL: The patient is AO x3, in no acute distress. HEENT: Head is normocephalic and atraumatic. EOMI are intact. Mouth is well hydrated and without lesions. NECK: Supple. No masses LUNGS: Clear to auscultation. No presence of rhonchi/wheezing/rales. Adequate chest expansion HEART: RRR, normal s1 and s2. ABDOMEN: Soft, nontender, no guarding, no peritoneal signs, and nondistended. BS +. No masses. EXTREMITIES: Without any cyanosis, clubbing, rash, lesions or edema. NEUROLOGIC: AOx3, no focal motor deficit. SKIN: no jaundice, no rashes  Imaging/Labs: as above  I personally reviewed and interpreted the available labs, imaging and endoscopic files.  Impression and Plan: Stephen Burch is a 65 y.o. male with past medical history of NASH cirrhosis complicated by grade 3 esophageal varices, Type II GOV varices, GERD complicated by Barrett's esophagus without dysplasia, history of intraoperatively cardiac arrest,  who presents for follow up of liver cirrhosis.  The patient has been asymptomatic and has not presented any decompensating events, however during his most recent EGD he was found to have grade 3 esophageal varices and gastric varices which were highly concerning.  Given the type of gastric varices he has, the use of band ligation on the esophageal varices may actually worsen his gastric varices.  I have thorough  discussion with the patient regarding the high risk of decompensation/massive bleeding that can happen when esophageal varices bleed, as well as a high mortality associated with it.  I stressed the importance of having a long-term plan which included trying another type of nonselective beta-blocker versus TIPS procedure.  After discussing these for close to 40 minutes, the, decision was made to start nadolol 20 mg every day and uptitrate as needed.  Ultimately, if this is not tolerated or fails to improve his esophageal varices, we may consider discussing the option for TIPS procedure further.   Even though he has presence of varices, his most recent MELD score is  low which is 10 and he does not warrant liver transplant evaluation at this moment yet.  We will repeat MELD labs.  He is up-to-date regarding [place] screening  - Check MELD labs - Start nadolol 20 mg every night - Reduce salt intake to <2 g per day - Can take Tylenol max of 2 g per day (650 mg q8h) for pain - Avoid NSAIDs for pain - Avoid eating raw oysters/shellfish - Ensure every night before going to sleep - RTC 3 months  All questions were answered.      Harvel Quale, MD Gastroenterology and Hepatology [facility]

## 2021-04-28 LAB — CBC WITH DIFFERENTIAL/PLATELET
Absolute Monocytes: 233 cells/uL (ref 200–950)
Basophils Absolute: 10 cells/uL (ref 0–200)
Basophils Relative: 0.4 %
Eosinophils Absolute: 264 cells/uL (ref 15–500)
Eosinophils Relative: 11 %
HCT: 38.2 % — ABNORMAL LOW (ref 38.5–50.0)
Hemoglobin: 13.3 g/dL (ref 13.2–17.1)
Lymphs Abs: 374 cells/uL — ABNORMAL LOW (ref 850–3900)
MCH: 32.4 pg (ref 27.0–33.0)
MCHC: 34.8 g/dL (ref 32.0–36.0)
MCV: 92.9 fL (ref 80.0–100.0)
MPV: 11.3 fL (ref 7.5–12.5)
Monocytes Relative: 9.7 %
Neutro Abs: 1519 cells/uL (ref 1500–7800)
Neutrophils Relative %: 63.3 %
Platelets: 99 10*3/uL — ABNORMAL LOW (ref 140–400)
RBC: 4.11 10*6/uL — ABNORMAL LOW (ref 4.20–5.80)
RDW: 13 % (ref 11.0–15.0)
Total Lymphocyte: 15.6 %
WBC: 2.4 10*3/uL — ABNORMAL LOW (ref 3.8–10.8)

## 2021-04-28 LAB — COMPREHENSIVE METABOLIC PANEL
AG Ratio: 1.9 (calc) (ref 1.0–2.5)
ALT: 21 U/L (ref 9–46)
AST: 27 U/L (ref 10–35)
Albumin: 4.1 g/dL (ref 3.6–5.1)
Alkaline phosphatase (APISO): 128 U/L (ref 35–144)
BUN: 11 mg/dL (ref 7–25)
CO2: 27 mmol/L (ref 20–32)
Calcium: 8.7 mg/dL (ref 8.6–10.3)
Chloride: 106 mmol/L (ref 98–110)
Creat: 0.78 mg/dL (ref 0.70–1.35)
Globulin: 2.2 g/dL (calc) (ref 1.9–3.7)
Glucose, Bld: 135 mg/dL (ref 65–139)
Potassium: 4.2 mmol/L (ref 3.5–5.3)
Sodium: 140 mmol/L (ref 135–146)
Total Bilirubin: 2.8 mg/dL — ABNORMAL HIGH (ref 0.2–1.2)
Total Protein: 6.3 g/dL (ref 6.1–8.1)

## 2021-04-28 LAB — PROTIME-INR
INR: 1.1
Prothrombin Time: 10.9 s (ref 9.0–11.5)

## 2021-05-01 DIAGNOSIS — D485 Neoplasm of uncertain behavior of skin: Secondary | ICD-10-CM | POA: Diagnosis not present

## 2021-05-01 DIAGNOSIS — L309 Dermatitis, unspecified: Secondary | ICD-10-CM | POA: Diagnosis not present

## 2021-05-01 DIAGNOSIS — B360 Pityriasis versicolor: Secondary | ICD-10-CM | POA: Diagnosis not present

## 2021-05-01 DIAGNOSIS — Z1283 Encounter for screening for malignant neoplasm of skin: Secondary | ICD-10-CM | POA: Diagnosis not present

## 2021-05-01 DIAGNOSIS — L570 Actinic keratosis: Secondary | ICD-10-CM | POA: Diagnosis not present

## 2021-05-09 ENCOUNTER — Telehealth (INDEPENDENT_AMBULATORY_CARE_PROVIDER_SITE_OTHER): Payer: Self-pay | Admitting: Internal Medicine

## 2021-05-09 NOTE — Telephone Encounter (Signed)
Pt prescribed med once a week for 8 weeks by dermatologist for rash on leg. Consult with dr Laural Golden and it is no problem to take me. Called and discussed with pt.  Pt verbalized understanding.

## 2021-05-09 NOTE — Telephone Encounter (Signed)
Patient came into the office stated he has been put on Fluconazole-150 mg, 1 tablet daily - would like for Dr Laural Golden to look at this to make sure it's ok for him to take - please advise - [phone]

## 2021-05-11 ENCOUNTER — Other Ambulatory Visit: Payer: Self-pay

## 2021-05-11 ENCOUNTER — Ambulatory Visit (HOSPITAL_COMMUNITY)
Admission: RE | Admit: 2021-05-11 | Discharge: 2021-05-11 | Disposition: A | Discharge status: Home / Self Care | Payer: PPO | Source: Ambulatory Visit | Attending: Family Medicine | Admitting: Family Medicine

## 2021-05-11 DIAGNOSIS — R053 Chronic cough: Secondary | ICD-10-CM | POA: Insufficient documentation

## 2021-05-11 DIAGNOSIS — I700 Atherosclerosis of aorta: Secondary | ICD-10-CM | POA: Diagnosis not present

## 2021-05-11 DIAGNOSIS — R911 Solitary pulmonary nodule: Secondary | ICD-10-CM | POA: Diagnosis not present

## 2021-05-30 ENCOUNTER — Telehealth: Payer: PPO

## 2021-06-06 ENCOUNTER — Ambulatory Visit: Payer: PPO

## 2021-06-06 DIAGNOSIS — I700 Atherosclerosis of aorta: Secondary | ICD-10-CM

## 2021-06-06 DIAGNOSIS — E1169 Type 2 diabetes mellitus with other specified complication: Secondary | ICD-10-CM

## 2021-06-06 NOTE — Patient Instructions (Addendum)
Visit Information  Thank you for taking time to visit with me today. Please don't hesitate to contact me if I can be of assistance to you before our next scheduled telephone appointment.

## 2021-06-06 NOTE — Chronic Care Management (AMB) (Signed)
Chronic Care Management   CCM RN Visit Note  06/06/2021 Name: Stephen Burch MRN: [id number] DOB: 19-Jun-1956  Subjective: Stephen Burch is a 65 y.o. year old male who is a primary care patient of Fisher, Demetrios Isaacs, MD. The care management team was consulted for assistance with disease management and care coordination needs.    Engaged with patient by telephone for follow up visit in response to provider referral for case management and care coordination services.   Consent to Services:  The patient was given information about Chronic Care Management services, agreed to services, and gave verbal consent prior to initiation of services.  Please see initial visit note for detailed documentation.    Assessment: Review of patient past medical history, allergies, medications, health status, including review of consultants reports, laboratory and other test data, was performed as part of comprehensive evaluation and provision of chronic care management services.   SDOH (Social Determinants of Health) assessments and interventions performed:  No  CCM Care Plan  Allergies  Allergen Reactions   Asa [Aspirin] Shortness Of Breath    "asthma symptoms"   Penicillins Shortness Of Breath    Has patient had a PCN reaction causing immediate rash, facial/tongue/throat swelling, SOB or lightheadedness with hypotension: Yes Has patient had a PCN reaction causing severe rash involving mucus membranes or skin necrosis: No Has patient had a PCN reaction that required hospitalization No Has patient had a PCN reaction occurring within the last 10 years: No If all of the above answers are "NO", then may proceed with Cephalosporin use. "asthma symptoms" Patient has tolerated cefazolin, ceftriaxo   Vancomycin Anaphylaxis    Immediately following being turned into prone position and Vancomycin administration in the OR patient cardiac arrest w/  Vfib.    Outpatient Encounter Medications as of 06/06/2021   Medication Stephen   EPINEPHrine (PRIMATENE MIST) 0.125 MG/ACT AERO Inhale into the lungs. One puff two to three times per week as needed.   glipiZIDE (GLUCOTROL XL) 2.5 MG 24 hr tablet Take 1 tablet by mouth once daily with breakfast   methimazole (TAPAZOLE) 10 MG tablet Take 10 mg by mouth daily.   nadolol (CORGARD) 20 MG tablet Take 1 tablet (20 mg total) by mouth at bedtime.   pantoprazole (PROTONIX) 40 MG tablet Take 1 tablet (40 mg total) by mouth daily before breakfast.   No facility-administered encounter medications on file as of 06/06/2021.    Patient Active Problem List   Diagnosis Date Noted   Esophageal varices (HCC) 04/27/2021   Gastric varices 04/27/2021   Atherosclerosis of aorta (HCC) 03/01/2020   History of adenomatous polyp of colon 02/11/2020   Diarrhea 01/19/2020   Hyperthyroidism 09/15/2019   Barrett's esophagus    Ground glass opacity present on imaging of lung 05/06/2019   Multiple lung nodules on CT 05/06/2019   Other pancytopenia (HCC) 12/31/2018   Other neutropenia (HCC)    History of atrial fibrillation 12/20/2018   Abnormal liver function 12/20/2018   Scapholunate advanced collapse of left wrist 08/27/2017   Unsteadiness on feet    Knee pain, acute    Thrombocytopenia (HCC)    Hypomagnesemia    Vitiligo 08/22/2015   AD (atopic dermatitis) 08/22/2015   Osteoarthritis of right knee 08/22/2015   Liver cirrhosis secondary to NASH (nonalcoholic steatohepatitis) (HCC) 08/22/2015   T2DM (type 2 diabetes mellitus) (HCC) 08/22/2015   GERD (gastroesophageal reflux disease) 08/22/2015   Ganglion of left wrist 08/22/2015   ED (erectile dysfunction) of organic origin  08/22/2015   Asthma 08/22/2015   Dysplastic nodule of liver 08/22/2015   Baker cyst 09/06/2014   H/O total knee replacement 08/05/2014   Bilateral inguinal hernia 08/01/2012   Splenomegaly 08/01/2012   Gallstones 08/01/2012    Conditions to be addressed/monitored:HTN and DMII  Patient Care  Plan: RN Care Management Plan of Care     Problem Identified: DM, HTN      Long-Range Goal: Disease Progression Prevented or Minimized   Start Date: 06/06/2021  Expected End Date: 09/04/2021  Priority: High  Note:   Current Barriers:  Chronic Disease Management support and education needs related to HTN and DMII   RNCM Clinical Goal(s):  Patient will demonstrate Ongoing adherence to prescribed treatment plan for HTN and DMII through collaboration with the provider, RN Care manager and care team.   Interventions: 1:1 collaboration with primary care provider regarding development and update of comprehensive plan of care as evidenced by provider attestation and co-signature Inter-disciplinary care team collaboration (see longitudinal plan of care) Evaluation of current treatment plan related to  self management and patient's adherence to plan as established by provider   Diabetes Interventions:  (Status:  Goal on track:  Yes.) Long Term Goal Assessed patient's understanding of A1c goal: <7%  Lab Results  Component Value Date   HGBA1C 6.3 (A) 05/16/2020    Hypertension Interventions:  (Status:  Goal on track:  Yes.) Long Term Goal Last practice recorded BP readings:  BP Readings from Last 3 Encounters:  04/27/21 129/80  01/25/21 117/70  11/04/20 112/75  Most recent eGFR/CrCl: No results found for: EGFR  No components found for: CRCL    Patient Goals/Self-Care Activities: Take all medications as prescribed Attend all scheduled provider appointments Call pharmacy for medication refills 3-7 days in advance of running out of medications Attend church or other social activities Perform all self care activities independently  Perform IADL's (shopping, preparing meals, housekeeping, managing finances) independently Call provider office for new concerns or questions     Follow Up Plan:   Will follow up in three months      PLAN A member of the care management team will  follow up in three months.   [facility] Care Management [facility] [phone]

## 2021-06-15 DIAGNOSIS — M25531 Pain in right wrist: Secondary | ICD-10-CM | POA: Diagnosis not present

## 2021-06-15 DIAGNOSIS — M19132 Post-traumatic osteoarthritis, left wrist: Secondary | ICD-10-CM | POA: Diagnosis not present

## 2021-07-25 ENCOUNTER — Other Ambulatory Visit: Payer: Self-pay | Admitting: Family Medicine

## 2021-08-08 DIAGNOSIS — R062 Wheezing: Secondary | ICD-10-CM | POA: Diagnosis not present

## 2021-08-08 DIAGNOSIS — E119 Type 2 diabetes mellitus without complications: Secondary | ICD-10-CM | POA: Diagnosis not present

## 2021-08-08 DIAGNOSIS — K219 Gastro-esophageal reflux disease without esophagitis: Secondary | ICD-10-CM | POA: Diagnosis not present

## 2021-08-08 DIAGNOSIS — K7460 Unspecified cirrhosis of liver: Secondary | ICD-10-CM | POA: Diagnosis not present

## 2021-08-08 DIAGNOSIS — E0590 Thyrotoxicosis, unspecified without thyrotoxic crisis or storm: Secondary | ICD-10-CM | POA: Diagnosis not present

## 2021-08-14 ENCOUNTER — Ambulatory Visit (INDEPENDENT_AMBULATORY_CARE_PROVIDER_SITE_OTHER): Payer: PPO | Admitting: Gastroenterology

## 2021-08-22 DIAGNOSIS — K7581 Nonalcoholic steatohepatitis (NASH): Secondary | ICD-10-CM | POA: Diagnosis not present

## 2021-08-22 DIAGNOSIS — K7460 Unspecified cirrhosis of liver: Secondary | ICD-10-CM | POA: Diagnosis not present

## 2021-08-22 DIAGNOSIS — E119 Type 2 diabetes mellitus without complications: Secondary | ICD-10-CM | POA: Diagnosis not present

## 2021-08-28 ENCOUNTER — Encounter (INDEPENDENT_AMBULATORY_CARE_PROVIDER_SITE_OTHER): Payer: Self-pay | Admitting: Gastroenterology

## 2021-08-28 ENCOUNTER — Ambulatory Visit (INDEPENDENT_AMBULATORY_CARE_PROVIDER_SITE_OTHER): Payer: PPO | Admitting: Gastroenterology

## 2021-08-28 ENCOUNTER — Other Ambulatory Visit: Payer: Self-pay

## 2021-08-28 VITALS — BP 123/75 | HR 90 | Temp 99.1°F | Ht 72.0 in | Wt 201.5 lb

## 2021-08-28 DIAGNOSIS — I8500 Esophageal varices without bleeding: Secondary | ICD-10-CM | POA: Diagnosis not present

## 2021-08-28 DIAGNOSIS — K2270 Barrett's esophagus without dysplasia: Secondary | ICD-10-CM

## 2021-08-28 DIAGNOSIS — K7460 Unspecified cirrhosis of liver: Secondary | ICD-10-CM | POA: Diagnosis not present

## 2021-08-28 DIAGNOSIS — K7581 Nonalcoholic steatohepatitis (NASH): Secondary | ICD-10-CM

## 2021-08-28 DIAGNOSIS — I864 Gastric varices: Secondary | ICD-10-CM

## 2021-08-28 DIAGNOSIS — K219 Gastro-esophageal reflux disease without esophagitis: Secondary | ICD-10-CM

## 2021-08-28 DIAGNOSIS — E1165 Type 2 diabetes mellitus with hyperglycemia: Secondary | ICD-10-CM | POA: Diagnosis not present

## 2021-08-28 DIAGNOSIS — E0590 Thyrotoxicosis, unspecified without thyrotoxic crisis or storm: Secondary | ICD-10-CM | POA: Diagnosis not present

## 2021-08-28 MED ORDER — NADOLOL 40 MG PO TABS: 40.0000 mg | ORAL_TABLET | Freq: Every day | ORAL | 1 refills | Status: DC

## 2021-08-28 NOTE — Patient Instructions (Signed)
We will update liver labs and [place] in April for your 6 month monitoring of cirrhosis I am increasing your nadolol to 77m daily, please let me know if you do not tolerate this, goal is for heart rate to be under 60 beats per minute in order to prevent bleeding of esophageal and gastric varices that you have Please continue protonix 458monce daily Please let me know if you have any rectal bleeding, black stools, vomiting, swelling to your abdomen, changes in skin color, confusion or other GI symptoms  Follow up in 3 months

## 2021-08-28 NOTE — Progress Notes (Signed)
Referring Provider: Birdie Sons, MD Primary Care Physician:  Birdie Sons, MD Primary GI Physician: Jenetta Downer  Chief Complaint  Patient presents with   Gastroesophageal Reflux    3 month follow up.    HPI:   DAYLON LAFAVOR is a 66 y.o. male with past medical history of NASH cirrhosis complicated by grade 3 esophgeal varices, Type II GOV varices, GERD complicated by Barrett's esophagus without dysplasia, hx of intraoperative cardiac arrest.   Patient presenting today for 3 month follow up of GERD and Cirrhosis.  Patient last seen in October 2022, at that time, patient reported that coreg 3.125m he was started on after EGD in May 2022 with finding of varices caused side effects which he could not tolerate, therefore he stopped taking medication. He had no specific GI complaints at that time. Started on nadolol 297mnightly after discussion of beta blocker vs. TIPS procedure as there was concern that in light of presence of gastric varices, banding esophageal varices could worsen these. MELD labs and [place] at that time with [place]owing Stable changes of cirrhosis. No focal mass is noted. Small gallbladder polyp measuring 6 mm. Last AFP April 2022 was WNL.   Previous MELD: 11  Cirrhosis related questions: Episodes of confusion/disorientation: no Taking diuretics? no Beta blockers? no Prior history of variceal banding? no Prior episodes of SBP?no Last liver imaging:04/2021 -negative for any liver masses, presence of a gallbladder polyp of 6 mm Alcohol use:  He states that he was doing well on nadalol until PCP put him on an inhaler for his asthma which was causing a rise in his BP. He has since stopped the inhaler . States he is tolerating the medication well. Denies episodes of swelling to abdomen or legs, confusion, yellowing of skin or itching.  GERD: taking protonix 4012mnce daily, denies any symptoms of reflux, no dysphagia, odynophagia, early satiety, abdominal pain,  nausea or vomiting.   Last Colonoscopy:2021 Last Endoscopy: 11/04/20 grade III esophageal varices, Type 2 gastroesophageal varices (GOV 2 esophageal varices which extend along the fundus) w/o bleeding, single gastric polyp  Recommendations:  Repeat colonoscopy in 2026  Past Medical History:  Diagnosis Date   Acute systolic (congestive) heart failure ([place]CC[place]0/30/2019   Arthritis    knees, wrists   Barrett's esophagus    Cardiac arrest ([place]/22/26/3335Complication of anesthesia    02-22-2016 intraop cardiac arrest immediately after vancomyocin administration , pt cardiac arrest w/ Vfib,  please refer to anesthesia record in epic   Dysrhythmia    Afib 12/2018 - converted to NSR   Esophageal varices determined by endoscopy ([place]CC[place]6/2018   grade 1   GERD (gastroesophageal reflux disease)    Hiatal hernia    History of adenomatous polyp of colon    History of asthma    as child   History of DVT (deep vein thrombosis)    right lower extremitty post-op right total knee surgery 09/ 2010,  treated w/ coumadin   History of esophageal dilatation 07/1998   for schatzski ring   History of kidney stones    History of staph infection 04/2016   MSSA infection of right total knee w/ sepsis   Hx of cardiac arrest    02-22-2016  intraoperative, immediately following moving pt into prone position and vancomyocin administration, cardiac arrest w/ Vfib (referred to anesthesia record in epic) pt extubated himself next day   Infection of prosthetic right knee joint ([place]  Liver cirrhosis secondary to NASH (nonalcoholic steatohepatitis) ([place])    NAFLD-- followed by dr Laural Golden---  liver bx 09-30-2012  mild portal and focal sinusoidal fibrosis   Pre-diabetes    Pyogenic arthritis of right knee joint ([place]) 04/02/2016   Scapholunate advanced collapse of left wrist    deformity   Shock circulatory ([place])    Staphylococcus aureus bacteremia with sepsis ([place])    Thrombocytopenia ([place])    10-09-2017 per pt  his platelets have always been low , followed by pcp, never been referred to hematologist    Past Surgical History:  Procedure Laterality Date   APPLICATION OF WOUND VAC Right 02/22/2016   Procedure: APPLICATION OF WOUND VAC;  Surgeon: Vickey Huger, MD;  Location: [place];  Service: Orthopedics;  Laterality: Right;   BIOPSY  02/10/2020   Procedure: BIOPSY;  Surgeon: Rogene Houston, MD;  [facility];  Service: Endoscopy;;   CARDIAC CATHETERIZATION N/A 02/24/2016   Procedure: Left Heart Cath and Coronary Angiography;  Surgeon: Burnell Blanks, MD;  Location: [place] [facility];  Service: Cardiovascular;  Laterality: N/A;  No angiographic evidence of CAD,  normal LVSF, ef 50-55%   CARPECTOMY Left 10/17/2017   Procedure: LEFT WRIST PROXIMAL ROW CARPECTOMY WITH POSTEROR IMBROSSEOUS NERVE EXCISION;  Surgeon: Charlotte Crumb, MD;  Location: [place];  Service: Orthopedics;  Laterality: Left;  AXILLARY BLOCK   CARPECTOMY WITH RADIAL STYLOIDECTOMY Left 02/11/2019   Procedure: LEFT WRIST RADIAL STYLOIDECTOMY;  Surgeon: Charlotte Crumb, MD;  Location: [place];  Service: Orthopedics;  Laterality: Left;   COLONOSCOPY     COLONOSCOPY WITH PROPOFOL N/A 02/10/2020   Procedure: COLONOSCOPY WITH PROPOFOL;  Surgeon: Rogene Houston, MD;  [facility];  Service: Endoscopy;  Laterality: N/A;  955   EAR CYST EXCISION Right 09/06/2014   Procedure: OPEN EXCISION BAKER'S CYST RIGHT KNEE;  Surgeon: Vickey Huger, MD;  Location: [place];  Service: Orthopedics;  Laterality: Right;   ESOPHAGEAL BANDING  06/30/2020   Procedure: ESOPHAGEAL BANDING;  Surgeon: Rogene Houston, MD;  [facility];  Service: Endoscopy;;   ESOPHAGEAL DILATION  1996/ 2000   ESOPHAGOGASTRODUODENOSCOPY N/A 09/17/2012   Procedure: ESOPHAGOGASTRODUODENOSCOPY (EGD);  Surgeon: Rogene Houston, MD;  [facility];  Service: Endoscopy;  Laterality: N/A;  200   ESOPHAGOGASTRODUODENOSCOPY N/A  12/15/2015   Procedure: ESOPHAGOGASTRODUODENOSCOPY (EGD);  Surgeon: Rogene Houston, MD;  [facility];  Service: Endoscopy;  Laterality: N/A;  1245   ESOPHAGOGASTRODUODENOSCOPY N/A 12/17/2016   Procedure: ESOPHAGOGASTRODUODENOSCOPY (EGD);  Surgeon: Rogene Houston, MD;  [facility];  Service: Endoscopy;  Laterality: N/A;  210   ESOPHAGOGASTRODUODENOSCOPY N/A 06/30/2020   Procedure: ESOPHAGOGASTRODUODENOSCOPY (EGD);  Surgeon: Rogene Houston, MD;  [facility];  Service: Endoscopy;  Laterality: N/A;  11:15   ESOPHAGOGASTRODUODENOSCOPY (EGD) WITH PROPOFOL N/A 02/10/2020   Procedure: ESOPHAGOGASTRODUODENOSCOPY (EGD) WITH PROPOFOL;  Surgeon: Rogene Houston, MD;  [facility];  Service: Endoscopy;  Laterality: N/A;   ESOPHAGOGASTRODUODENOSCOPY (EGD) WITH PROPOFOL N/A 11/04/2020   Procedure: ESOPHAGOGASTRODUODENOSCOPY (EGD) WITH PROPOFOL;  Surgeon: Harvel Quale, MD;  [facility];  Service: Gastroenterology;  Laterality: N/A;  9:45   I & D KNEE WITH POLY EXCHANGE Right 04/02/2016   Procedure: IRRIGATION AND DEBRIDEMENT RIGHT KNEE WITH POLY EXCHANGE;  Surgeon: Vickey Huger, MD;  Location: [place];  Service: Orthopedics;  Laterality: Right;   INCISION AND DRAINAGE HEMATOMA POST LEFT  TOTAL KNEE  2012   INGUINAL HERNIA REPAIR Bilateral 05/22/2013   Procedure: REPAIR OF RECURRENT INCARCERATED INGUINAL HERNIA WITH MESH RIGHT SIDE,  REPAIR OF RECURRENT INGUINAL HERNIA WITH MESH LEFT SIDE;  Surgeon: [name], MD;  Location: [place];  Service: General;  Laterality: Bilateral;   INGUINAL HERNIA REPAIR Bilateral 11/1997   INSERTION OF MESH Bilateral 05/22/2013   Procedure: INSERTION OF MESH;  Surgeon: [name], MD;  Location: [place];  Service: General;  Laterality: Bilateral;   IRRIGATION AND DEBRIDEMENT KNEE Right 02/22/2016   Procedure: IRRIGATION AND DEBRIDEMENT KNEE;  Surgeon: Vickey Huger, MD;  Location: [place];  Service: Orthopedics;   Laterality: Right;   Irrigation and Debridement right knee  03/23/2009   Dr. Sabra Heck, [place] ARTHROSCOPY W/ SYNOVECTOMY Right 01/30/2010   AND DEBRIDEMENT OF HETEROTOPIC   LIVER BIOPSY  09/30/2012   REVISION TOTAL KNEE ARTHROPLASTY Left fall 2012   TONSILLECTOMY  child   TOTAL KNEE ARTHROPLASTY Bilateral right 09/ 2010/  left 09-18-2010   TRANSTHORACIC ECHOCARDIOGRAM  02/22/2016   ef 30-35% (per cardiac cath 02-24-2016 normal), severe hypokinesis of the mid-apicalanteroseptal and apical myocardium,  grade 1 diastolic dysfunction/ trivial PR and TR   TRIGGER FINGER RELEASE Left 02/11/2019   Procedure: LEFT WRIST STENOSING TENOSYNOVITIS RELEASE;  Surgeon: Charlotte Crumb, MD;  Location: [place];  Service: Orthopedics;  Laterality: Left;  MAC WITH AXILLARY BLOCK   WRIST ARTHROPLASTY Left 10/17/2017   Procedure: CAPITATE RESURFACING ARTHROPLASTY;  Surgeon: Charlotte Crumb, MD;  Location[facility];  Service: Orthopedics;  Laterality: Left;    Current Outpatient Medications  Medication Sig Dispense Refill   EPINEPHrine (PRIMATENE MIST IN) Inhale into the lungs.     glipiZIDE (GLUCOTROL XL) 2.5 MG 24 hr tablet Take 1 tablet by mouth once daily with breakfast 90 tablet 4   methimazole (TAPAZOLE) 10 MG tablet Take 10 mg by mouth daily.     nadolol (CORGARD) 20 MG tablet Take 1 tablet (20 mg total) by mouth at bedtime. 90 tablet 1   pantoprazole (PROTONIX) 40 MG tablet Take 1 tablet (40 mg total) by mouth daily before breakfast. 90 tablet 3   No current facility-administered medications for this visit.    Allergies as of 08/28/2021 - Review Complete 08/28/2021  Allergen Reaction Noted   Asa [aspirin] Shortness Of Breath 04/01/2016   Penicillins Shortness Of Breath 07/04/2012   Vancomycin Anaphylaxis 02/22/2016    Family History  Problem Relation Age of Onset   Arthritis Father        died age 26   GI Bleed Father        upper GI Bleed, non-ETOH cirrhosis    Arthritis Brother    Diabetes Brother        type 2   Diabetes Paternal Uncle        type 2   Heart attack Maternal Grandmother    Heart attack Maternal Grandfather    Colon cancer Neg Hx    Colon polyps Neg Hx     Social History   Socioeconomic History   Marital status: Legally Separated    Spouse name: Not on file   Number of children: 2   Years of education: Not on file   Highest education level: 12th grade  Occupational History    Employer: [employer]    Comment: part time  Tobacco Use   Smoking status: Former    Packs/day: 0.25    Years: 3.00    Pack years:  0.75    Types: Cigarettes    Quit date: 10/10/1974    Years since quitting: 46.9   Smokeless tobacco: Never  Vaping Use   Vaping Use: Never used  Substance and Sexual Activity   Alcohol use: Not Currently   Drug use: No   Sexual activity: Yes  Other Topics Concern   Not on file  Social History Narrative   ** Merged History Encounter **       Pt lives alone.   Social Determinants of Health   Financial Resource Strain: Not on file  Food Insecurity: No Food Insecurity   Worried About Charity fundraiser in the Last Year: Never true   Ran Out of Food in the Last Year: Never true  Transportation Needs: No Transportation Needs   Lack of Transportation (Medical): No   Lack of Transportation (Non-Medical): No  Physical Activity: Not on file  Stress: Not on file  Social Connections: Not on file   Review of systems General: negative for malaise, night sweats, fever, chills, weight loss Neck: Negative for lumps, goiter, pain and significant neck swelling Resp: Negative for cough, wheezing, dyspnea at rest CV: Negative for chest pain, leg swelling, palpitations, orthopnea GI: denies melena, hematochezia, nausea, vomiting, diarrhea, constipation, dysphagia, odyonophagia, early satiety or unintentional weight loss.  MSK: Negative for joint pain or swelling, back pain, and muscle pain. Derm: Negative for itching  or rash Psych: Denies depression, anxiety, memory loss, confusion. No homicidal or suicidal ideation.  Heme: Negative for prolonged bleeding, bruising easily, and swollen nodes. Endocrine: Negative for cold or heat intolerance, polyuria, polydipsia and goiter. Neuro: negative for tremor, gait imbalance, syncope and seizures. The remainder of the review of systems is noncontributory.  Physical Exam: BP 123/75 (BP Location: Left Arm, Patient Position: Sitting, Cuff Size: Large)    Pulse 90    Temp 99.1 F (37.3 C) (Oral)    Ht 6' (1.829 m)    Wt 201 lb 8 oz (91.4 kg)    BMI 27.33 kg/m  General:   Alert and oriented. No distress noted. Pleasant and cooperative.  Head:  Normocephalic and atraumatic. Eyes:  Conjuctiva clear without scleral icterus. Mouth:  Oral mucosa pink and moist. Good dentition. No lesions. Heart: Normal rate and rhythm, s1 and s2 heart sounds present.  Lungs: Clear lung sounds in all lobes. Respirations equal and unlabored. Abdomen:  +BS, soft, non-tender and non-distended. No rebound or guarding. No HSM or masses noted. Derm: No palmar erythema or jaundice Msk:  Symmetrical without gross deformities. Normal posture. Extremities:  Without edema. Neurologic:  Alert and  oriented x4 Psych:  Alert and cooperative. Normal mood and affect.  Invalid input(s): 6 MONTHS   ASSESSMENT: MASSIMILIANO ROHLEDER is a 66 y.o. male presenting today for 3 month follow up of GERD/cirrhosis.  Cirrhosis well managed, complicated by presence of esophageal and gastric varices, he was started on nadolol 19m at last office visit after beta blocker vs TIPS was discussed with him, as he is not a good candidate for esophageal variceal banding given presence of gastric varices. He is tolerating Nadolol without any issue, BP is 123/75, however, HR remains 90 today in office, we will increase to 491monce daily, I discussed that goal HR is <60 BPM. He will let me know if he does not tolerate increase in  beta blocker. No red flag symptoms. Patient denies melena, hematochezia, nausea, vomiting, diarrhea, constipation, dysphagia, odyonophagia, early satiety, jaundice, pruritus, ascites LE edema, or weight  loss.   MELD labs due at the end of April, as is his RUQ [place]. AFP is overdue, I discussed this with patient who prefers to wait until April to update this along with the rest of his labs.   PLAN:  -MELD labs due October 26 2021 -RUQ [place] due October 19 2021 -Patient declines updating AFP today -Increase nadolol to 108m once a day, can take if night if noticing side effects -Obtain recent labs from PCP Dr. FCaryn Section- Reduce salt intake to <2 g per day - Can take Tylenol max of 2 g per day (650 mg q8h) for pain - Avoid NSAIDs for pain - Avoid eating raw oysters/shellfish - Ensure every night before going to sleep   Follow Up: 3 months  Lyndell Gillyard L. CAlver Sorrow MSN, APRN, AGNP-C Adult-Gerontology Nurse Practitioner RLos [facility]

## 2021-08-31 DIAGNOSIS — E0590 Thyrotoxicosis, unspecified without thyrotoxic crisis or storm: Secondary | ICD-10-CM | POA: Diagnosis not present

## 2021-08-31 DIAGNOSIS — E538 Deficiency of other specified B group vitamins: Secondary | ICD-10-CM | POA: Diagnosis not present

## 2021-08-31 DIAGNOSIS — E1165 Type 2 diabetes mellitus with hyperglycemia: Secondary | ICD-10-CM | POA: Diagnosis not present

## 2021-09-07 DIAGNOSIS — Z125 Encounter for screening for malignant neoplasm of prostate: Secondary | ICD-10-CM | POA: Diagnosis not present

## 2021-09-07 DIAGNOSIS — K219 Gastro-esophageal reflux disease without esophagitis: Secondary | ICD-10-CM | POA: Diagnosis not present

## 2021-09-07 DIAGNOSIS — R350 Frequency of micturition: Secondary | ICD-10-CM | POA: Diagnosis not present

## 2021-09-07 DIAGNOSIS — K7460 Unspecified cirrhosis of liver: Secondary | ICD-10-CM | POA: Diagnosis not present

## 2021-09-07 DIAGNOSIS — F5221 Male erectile disorder: Secondary | ICD-10-CM | POA: Diagnosis not present

## 2021-09-07 DIAGNOSIS — E0590 Thyrotoxicosis, unspecified without thyrotoxic crisis or storm: Secondary | ICD-10-CM | POA: Diagnosis not present

## 2021-09-07 DIAGNOSIS — N3281 Overactive bladder: Secondary | ICD-10-CM | POA: Diagnosis not present

## 2021-09-07 DIAGNOSIS — E1165 Type 2 diabetes mellitus with hyperglycemia: Secondary | ICD-10-CM | POA: Diagnosis not present

## 2021-09-07 DIAGNOSIS — D61818 Other pancytopenia: Secondary | ICD-10-CM | POA: Diagnosis not present

## 2021-09-07 DIAGNOSIS — I8500 Esophageal varices without bleeding: Secondary | ICD-10-CM | POA: Diagnosis not present

## 2021-09-15 ENCOUNTER — Telehealth: Payer: PPO

## 2021-09-21 ENCOUNTER — Telehealth (INDEPENDENT_AMBULATORY_CARE_PROVIDER_SITE_OTHER): Payer: Self-pay

## 2021-09-21 NOTE — Telephone Encounter (Signed)
[name] I have Stephen Burch scheduled for his [facility] of the [place] and he states he would like for you to call him he has a question he needs to ask you. ?

## 2021-09-27 ENCOUNTER — Ambulatory Visit (HOSPITAL_COMMUNITY)
Admission: RE | Admit: 2021-09-27 | Discharge: 2021-09-27 | Disposition: A | Discharge status: Home / Self Care | Payer: PPO | Source: Ambulatory Visit | Attending: Gastroenterology | Admitting: Gastroenterology

## 2021-09-27 ENCOUNTER — Other Ambulatory Visit: Payer: Self-pay

## 2021-09-27 DIAGNOSIS — K7581 Nonalcoholic steatohepatitis (NASH): Secondary | ICD-10-CM | POA: Diagnosis not present

## 2021-09-27 DIAGNOSIS — K7460 Unspecified cirrhosis of liver: Secondary | ICD-10-CM | POA: Insufficient documentation

## 2021-10-10 ENCOUNTER — Other Ambulatory Visit (INDEPENDENT_AMBULATORY_CARE_PROVIDER_SITE_OTHER): Payer: Self-pay | Admitting: *Deleted

## 2021-10-10 DIAGNOSIS — I8500 Esophageal varices without bleeding: Secondary | ICD-10-CM

## 2021-10-10 DIAGNOSIS — K7460 Unspecified cirrhosis of liver: Secondary | ICD-10-CM

## 2021-10-10 DIAGNOSIS — I864 Gastric varices: Secondary | ICD-10-CM

## 2021-10-19 DIAGNOSIS — I864 Gastric varices: Secondary | ICD-10-CM | POA: Diagnosis not present

## 2021-10-19 DIAGNOSIS — K7581 Nonalcoholic steatohepatitis (NASH): Secondary | ICD-10-CM | POA: Diagnosis not present

## 2021-10-19 DIAGNOSIS — K7460 Unspecified cirrhosis of liver: Secondary | ICD-10-CM | POA: Diagnosis not present

## 2021-10-19 DIAGNOSIS — I8500 Esophageal varices without bleeding: Secondary | ICD-10-CM | POA: Diagnosis not present

## 2021-10-20 LAB — COMPREHENSIVE METABOLIC PANEL
AG Ratio: 1.7 (calc) (ref 1.0–2.5)
ALT: 20 U/L (ref 9–46)
AST: 22 U/L (ref 10–35)
Albumin: 4 g/dL (ref 3.6–5.1)
Alkaline phosphatase (APISO): 134 U/L (ref 35–144)
BUN: 12 mg/dL (ref 7–25)
CO2: 27 mmol/L (ref 20–32)
Calcium: 8.3 mg/dL — ABNORMAL LOW (ref 8.6–10.3)
Chloride: 107 mmol/L (ref 98–110)
Creat: 0.79 mg/dL (ref 0.70–1.35)
Globulin: 2.4 g/dL (calc) (ref 1.9–3.7)
Glucose, Bld: 101 mg/dL — ABNORMAL HIGH (ref 65–99)
Potassium: 3.9 mmol/L (ref 3.5–5.3)
Sodium: 141 mmol/L (ref 135–146)
Total Bilirubin: 2.4 mg/dL — ABNORMAL HIGH (ref 0.2–1.2)
Total Protein: 6.4 g/dL (ref 6.1–8.1)

## 2021-10-20 LAB — CBC
HCT: 37.7 % — ABNORMAL LOW (ref 38.5–50.0)
Hemoglobin: 12.8 g/dL — ABNORMAL LOW (ref 13.2–17.1)
MCH: 32.5 pg (ref 27.0–33.0)
MCHC: 34 g/dL (ref 32.0–36.0)
MCV: 95.7 fL (ref 80.0–100.0)
MPV: 11.7 fL (ref 7.5–12.5)
Platelets: 89 10*3/uL — ABNORMAL LOW (ref 140–400)
RBC: 3.94 10*6/uL — ABNORMAL LOW (ref 4.20–5.80)
RDW: 12.5 % (ref 11.0–15.0)
WBC: 2.4 10*3/uL — ABNORMAL LOW (ref 3.8–10.8)

## 2021-10-20 LAB — PROTIME-INR
INR: 1.1
Prothrombin Time: 11.3 s (ref 9.0–11.5)

## 2021-10-20 LAB — AFP TUMOR MARKER: AFP-Tumor Marker: 2.4 ng/mL (ref <6.1)

## 2021-11-21 DIAGNOSIS — J069 Acute upper respiratory infection, unspecified: Secondary | ICD-10-CM | POA: Diagnosis not present

## 2021-11-21 DIAGNOSIS — R062 Wheezing: Secondary | ICD-10-CM | POA: Diagnosis not present

## 2021-11-21 DIAGNOSIS — R059 Cough, unspecified: Secondary | ICD-10-CM | POA: Diagnosis not present

## 2021-11-22 DIAGNOSIS — K7581 Nonalcoholic steatohepatitis (NASH): Secondary | ICD-10-CM | POA: Diagnosis not present

## 2021-11-22 DIAGNOSIS — K219 Gastro-esophageal reflux disease without esophagitis: Secondary | ICD-10-CM | POA: Diagnosis not present

## 2021-11-22 DIAGNOSIS — K7460 Unspecified cirrhosis of liver: Secondary | ICD-10-CM | POA: Diagnosis not present

## 2021-11-28 ENCOUNTER — Ambulatory Visit (INDEPENDENT_AMBULATORY_CARE_PROVIDER_SITE_OTHER): Payer: PPO | Admitting: Gastroenterology

## 2021-11-28 ENCOUNTER — Encounter (INDEPENDENT_AMBULATORY_CARE_PROVIDER_SITE_OTHER): Payer: Self-pay | Admitting: Gastroenterology

## 2021-11-28 VITALS — BP 129/70 | HR 65 | Temp 98.1°F | Ht 72.0 in | Wt 199.8 lb

## 2021-11-28 DIAGNOSIS — K7460 Unspecified cirrhosis of liver: Secondary | ICD-10-CM

## 2021-11-28 DIAGNOSIS — K7581 Nonalcoholic steatohepatitis (NASH): Secondary | ICD-10-CM | POA: Diagnosis not present

## 2021-11-28 DIAGNOSIS — I8500 Esophageal varices without bleeding: Secondary | ICD-10-CM | POA: Diagnosis not present

## 2021-11-28 DIAGNOSIS — K219 Gastro-esophageal reflux disease without esophagitis: Secondary | ICD-10-CM

## 2021-11-28 NOTE — Progress Notes (Signed)
Referring Provider: Birdie Sons, MD Primary Care Physician:  Birdie Sons, MD Primary GI Physician: Jenetta Downer  Chief Complaint  Patient presents with   Follow-up    Patient here today for a follow . He denies any current Gi issues.   HPI:   Stephen Burch is a 66 y.o. male with past medical history of of NASH cirrhosis complicated by grade 3 esophgeal varices, Type II GOV varices, GERD complicated by Barrett's esophagus without dysplasia, hx of intraoperative cardiac arrest.    Patient presenting today for follow up of GERD/Cirrhosis.   History: Last seen 2/227/23, at that time he was on nadaolol 44m and doing well with this, denied issues with confusion, edema, jaundice or pruritus, however, BP/HR were not within desired range, therefore nadolol was increased to 466mdaily. Liver [place] MELD labs/INR/AFP updated in April 2023 with AFP 2.4, LFTs WNL other than T bili of 2.4, plt count 89k, INR 1.1.   GERD remained well managed on protonix 4016mnce daily with no breakthrough reflux symptoms, dysphagia, odynophagia, early satiety or abdominal pain.   Present: Patient reports he was feeling more tired and "like a zombie" on nadalol. States that he did not notice much improvement in BP and HR with increased dose to 29m28mily. He stopped beta blocker a few months ago. Notes his blood sugars also seemed to go down some after stopping this. He does regularly check HR and BP at home and States that BP has been in the 120s and HR between 65 and 70. He notes that even on the 20mg36me of nadolol he felt more fatigued and without any energy. He reports that he feels he has noticed his abdomen looking slightly larger recently, though unsure if this may just be related to eating more and not working out as much. Denies swelling to his LEs, itching or jaundice. No episodes of confusion. Denies rectal bleeding or melena. No nausea or vomiting.    GERD is well managed on protonix 29mg 57m  daily, no episodes of breakthrough reflux, dysphagia, odynophagia, early satiety.    Previous MELD: 11 Previous Child Pugh: Class A  Cirrhosis related questions: Episodes of confusion/disorientation: NO Hematemesis/coffee ground emesis: NO Ascites: NO Jaundice/pruritus:NO  Taking diuretics?NO  Beta blockers? Nadolol 40 mg Prior history of variceal banding? NO Prior episodes of SBP? NO Last liver imaging: 09/27/21 cirrhosis, patent main portal vein with hepatopetal flow, no ascites Alcohol use:  no   Last Colonoscopy:2021 Last Endoscopy: 11/04/20 grade III esophageal varices, Type 2 gastroesophageal varices (GOV 2 esophageal varices which extend along the fundus) w/o bleeding, single gastric polyp  Recommendations:    Past Medical History:  Diagnosis Date   Acute systolic (congestive) heart failure (HCC) 1[place]0/2019   Arthritis    knees, wrists   Barrett's esophagus    Cardiac arrest (HCC) [id number]/20/35/4656plication of anesthesia    02-22-2016 intraop cardiac arrest immediately after vancomyocin administration , pt cardiac arrest w/ Vfib,  please refer to anesthesia record in epic   Dysrhythmia    Afib 12/2018 - converted to NSR   Esophageal varices determined by endoscopy (HCC) [id number]   grade 1   GERD (gastroesophageal reflux disease)    Hiatal hernia    History of adenomatous polyp of colon    History of asthma    as child   History of DVT (deep vein thrombosis)    right lower extremitty post-op right total knee surgery 09/ 2010,  treated  w/ coumadin   History of esophageal dilatation 07/1998   for schatzski ring   History of kidney stones    History of staph infection 04/2016   MSSA infection of right total knee w/ sepsis   Hx of cardiac arrest    02-22-2016  intraoperative, immediately following moving pt into prone position and vancomyocin administration, cardiac arrest w/ Vfib (referred to anesthesia record in epic) pt extubated himself next day   Infection of  prosthetic right knee joint ([place])    Liver cirrhosis secondary to NASH (nonalcoholic steatohepatitis) ([place])    NAFLD-- followed by dr Laural Golden---  liver bx 09-30-2012  mild portal and focal sinusoidal fibrosis   Pre-diabetes    Pyogenic arthritis of right knee joint ([place]) 04/02/2016   Scapholunate advanced collapse of left wrist    deformity   Shock circulatory ([place])    Staphylococcus aureus bacteremia with sepsis ([place])    Thrombocytopenia ([place])    10-09-2017 per pt his platelets have always been low , followed by pcp, never been referred to hematologist    Past Surgical History:  Procedure Laterality Date   APPLICATION OF WOUND VAC Right 02/22/2016   Procedure: APPLICATION OF WOUND VAC;  Surgeon: Vickey Huger, MD;  Location: [place];  Service: Orthopedics;  Laterality: Right;   BIOPSY  02/10/2020   Procedure: BIOPSY;  Surgeon: Rogene Houston, MD;  [facility];  Service: Endoscopy;;   CARDIAC CATHETERIZATION N/A 02/24/2016   Procedure: Left Heart Cath and Coronary Angiography;  Surgeon: Burnell Blanks, MD;  Location: [place] [facility];  Service: Cardiovascular;  Laterality: N/A;  No angiographic evidence of CAD,  normal LVSF, ef 50-55%   CARPECTOMY Left 10/17/2017   Procedure: LEFT WRIST PROXIMAL ROW CARPECTOMY WITH POSTEROR IMBROSSEOUS NERVE EXCISION;  Surgeon: Charlotte Crumb, MD;  Location: [place];  Service: Orthopedics;  Laterality: Left;  AXILLARY BLOCK   CARPECTOMY WITH RADIAL STYLOIDECTOMY Left 02/11/2019   Procedure: LEFT WRIST RADIAL STYLOIDECTOMY;  Surgeon: Charlotte Crumb, MD;  Location: [place];  Service: Orthopedics;  Laterality: Left;   COLONOSCOPY     COLONOSCOPY WITH PROPOFOL N/A 02/10/2020   Procedure: COLONOSCOPY WITH PROPOFOL;  Surgeon: Rogene Houston, MD;  [facility];  Service: Endoscopy;  Laterality: N/A;  955   EAR CYST EXCISION Right 09/06/2014   Procedure: OPEN EXCISION BAKER'S CYST RIGHT KNEE;  Surgeon: Vickey Huger,  MD;  Location: [place];  Service: Orthopedics;  Laterality: Right;   ESOPHAGEAL BANDING  06/30/2020   Procedure: ESOPHAGEAL BANDING;  Surgeon: Rogene Houston, MD;  [facility];  Service: Endoscopy;;   ESOPHAGEAL DILATION  1996/ 2000   ESOPHAGOGASTRODUODENOSCOPY N/A 09/17/2012   Procedure: ESOPHAGOGASTRODUODENOSCOPY (EGD);  Surgeon: Rogene Houston, MD;  [facility];  Service: Endoscopy;  Laterality: N/A;  200   ESOPHAGOGASTRODUODENOSCOPY N/A 12/15/2015   Procedure: ESOPHAGOGASTRODUODENOSCOPY (EGD);  Surgeon: Rogene Houston, MD;  [facility];  Service: Endoscopy;  Laterality: N/A;  1245   ESOPHAGOGASTRODUODENOSCOPY N/A 12/17/2016   Procedure: ESOPHAGOGASTRODUODENOSCOPY (EGD);  Surgeon: Rogene Houston, MD;  [facility];  Service: Endoscopy;  Laterality: N/A;  210   ESOPHAGOGASTRODUODENOSCOPY N/A 06/30/2020   Procedure: ESOPHAGOGASTRODUODENOSCOPY (EGD);  Surgeon: Rogene Houston, MD;  [facility];  Service: Endoscopy;  Laterality: N/A;  11:15   ESOPHAGOGASTRODUODENOSCOPY (EGD) WITH PROPOFOL N/A 02/10/2020   Procedure: ESOPHAGOGASTRODUODENOSCOPY (EGD) WITH PROPOFOL;  Surgeon: Rogene Houston, MD;  [facility];  Service: Endoscopy;  Laterality: N/A;   ESOPHAGOGASTRODUODENOSCOPY (EGD) WITH PROPOFOL N/A 11/04/2020   Procedure: ESOPHAGOGASTRODUODENOSCOPY (EGD) WITH PROPOFOL;  Surgeon: Harvel Quale, MD;  [facility];  Service: Gastroenterology;  Laterality: N/A;  9:45   I & D KNEE WITH POLY EXCHANGE Right 04/02/2016   Procedure: IRRIGATION AND DEBRIDEMENT RIGHT KNEE WITH POLY EXCHANGE;  Surgeon: Vickey Huger, MD;  Location: [place];  Service: Orthopedics;  Laterality: Right;   INCISION AND DRAINAGE HEMATOMA POST LEFT  TOTAL KNEE  2012   INGUINAL HERNIA REPAIR Bilateral 05/22/2013   Procedure: REPAIR OF RECURRENT INCARCERATED INGUINAL HERNIA WITH MESH RIGHT SIDE,  REPAIR OF RECURRENT INGUINAL HERNIA WITH MESH LEFT  SIDE;  Surgeon: [name], MD;  Location: [place];  Service: General;  Laterality: Bilateral;   INGUINAL HERNIA REPAIR Bilateral 11/1997   INSERTION OF MESH Bilateral 05/22/2013   Procedure: INSERTION OF MESH;  Surgeon: [name], MD;  Location: [place];  Service: General;  Laterality: Bilateral;   IRRIGATION AND DEBRIDEMENT KNEE Right 02/22/2016   Procedure: IRRIGATION AND DEBRIDEMENT KNEE;  Surgeon: Vickey Huger, MD;  Location: [place];  Service: Orthopedics;  Laterality: Right;   Irrigation and Debridement right knee  03/23/2009   Dr. Sabra Heck, [place] ARTHROSCOPY W/ SYNOVECTOMY Right 01/30/2010   AND DEBRIDEMENT OF HETEROTOPIC   LIVER BIOPSY  09/30/2012   REVISION TOTAL KNEE ARTHROPLASTY Left fall 2012   TONSILLECTOMY  child   TOTAL KNEE ARTHROPLASTY Bilateral right 09/ 2010/  left 09-18-2010   TRANSTHORACIC ECHOCARDIOGRAM  02/22/2016   ef 30-35% (per cardiac cath 02-24-2016 normal), severe hypokinesis of the mid-apicalanteroseptal and apical myocardium,  grade 1 diastolic dysfunction/ trivial PR and TR   TRIGGER FINGER RELEASE Left 02/11/2019   Procedure: LEFT WRIST STENOSING TENOSYNOVITIS RELEASE;  Surgeon: Charlotte Crumb, MD;  Location: [place];  Service: Orthopedics;  Laterality: Left;  MAC WITH AXILLARY BLOCK   WRIST ARTHROPLASTY Left 10/17/2017   Procedure: CAPITATE RESURFACING ARTHROPLASTY;  Surgeon: Charlotte Crumb, MD;  Location: [facility];  Service: Orthopedics;  Laterality: Left;    Current Outpatient Medications  Medication Sig Dispense Refill   benzonatate (TESSALON) 100 MG capsule benzonatate 100 mg capsule  Take 1 capsule 3 times a day by oral route for 10 days.     doxycycline (VIBRAMYCIN) 100 MG capsule doxycycline hyclate 100 mg capsule  Take 1 capsule twice a day by oral route for 10 days.     EPINEPHrine (PRIMATENE MIST IN) Inhale into the lungs.     glipiZIDE (GLUCOTROL XL) 2.5 MG 24 hr tablet Take 1 tablet by mouth once daily with  breakfast 90 tablet 4   methimazole (TAPAZOLE) 10 MG tablet Take 15 mg by mouth daily.     OVER THE COUNTER MEDICATION B 12 500 mcg two every day.     pantoprazole (PROTONIX) 40 MG tablet Take 1 tablet (40 mg total) by mouth daily before breakfast. 90 tablet 3   No current facility-administered medications for this visit.    Allergies as of 11/28/2021 - Review Complete 11/28/2021  Allergen Reaction Noted   Asa [aspirin] Shortness Of Breath 04/01/2016   Penicillins Shortness Of Breath 07/04/2012   Vancomycin Anaphylaxis 02/22/2016    Family History  Problem Relation Age of Onset   Arthritis Father        died age 70   GI Bleed Father        upper GI Bleed, non-ETOH cirrhosis   Arthritis Brother  Diabetes Brother        type 2   Diabetes Paternal Uncle        type 2   Heart attack Maternal Grandmother    Heart attack Maternal Grandfather    Colon cancer Neg Hx    Colon polyps Neg Hx     Social History   Socioeconomic History   Marital status: Legally Separated    Spouse name: Not on file   Number of children: 2   Years of education: Not on file   Highest education level: 12th grade  Occupational History    Employer: [employer]    Comment: part time  Tobacco Use   Smoking status: Former    Packs/day: 0.25    Years: 3.00    Pack years: 0.75    Types: Cigarettes    Quit date: 10/10/1974    Years since quitting: 47.1   Smokeless tobacco: Never  Vaping Use   Vaping Use: Never used  Substance and Sexual Activity   Alcohol use: Not Currently   Drug use: No   Sexual activity: Yes  Other Topics Concern   Not on file  Social History Narrative   ** Merged History Encounter **       Pt lives alone.   Social Determinants of Health   Financial Resource Strain: Not on file  Food Insecurity: No Food Insecurity   Worried About Charity fundraiser in the Last Year: Never true   Ran Out of Food in the Last Year: Never true  Transportation Needs: No Transportation  Needs   Lack of Transportation (Medical): No   Lack of Transportation (Non-Medical): No  Physical Activity: Not on file  Stress: Not on file  Social Connections: Not on file   Review of systems General: negative for malaise, night sweats, fever, chills, weight loss Neck: Negative for lumps, goiter, pain and significant neck swelling Resp: Negative for cough, wheezing, dyspnea at rest CV: Negative for chest pain, leg swelling, palpitations, orthopnea GI: denies melena, hematochezia, nausea, vomiting, diarrhea, constipation, dysphagia, odyonophagia, early satiety or unintentional weight loss.  MSK: Negative for joint pain or swelling, back pain, and muscle pain. Derm: Negative for itching or rash Psych: Denies depression, anxiety, memory loss, confusion. No homicidal or suicidal ideation.  Heme: Negative for prolonged bleeding, bruising easily, and swollen nodes. Endocrine: Negative for cold or heat intolerance, polyuria, polydipsia and goiter. Neuro: negative for tremor, gait imbalance, syncope and seizures. The remainder of the review of systems is noncontributory.  Physical Exam: BP 129/70 (BP Location: Left Arm, Patient Position: Sitting, Cuff Size: Large)   Pulse 65   Temp 98.1 F (36.7 C) (Oral)   Ht 6' (1.829 m)   Wt 199 lb 12.8 oz (90.6 kg)   BMI 27.10 kg/m  General:   Alert and oriented. No distress noted. Pleasant and cooperative.  Head:  Normocephalic and atraumatic. Eyes:  Conjuctiva clear without scleral icterus. Mouth:  Oral mucosa pink and moist. Good dentition. No lesions. Heart: Normal rate and rhythm, s1 and s2 heart sounds present.  Lungs: Clear lung sounds in all lobes. Respirations equal and unlabored. Abdomen:  +BS, soft, non-tender and non-distended. No rebound or guarding. No HSM or masses noted. Derm: No palmar erythema or jaundice Msk:  Symmetrical without gross deformities. Normal posture. Extremities:  Without edema. Neurologic:  Alert and  oriented  x4 Psych:  Alert and cooperative. Normal mood and affect.  Invalid input(s): 6 MONTHS   ASSESSMENT: Treasa School  is a 66 y.o. male presenting today for follow up of Cirrhosis and GERD.  GERD: well managed on protonix 60m once daily without breakthrough symptoms. Will continue with current PPI dose.  Cirrhosis: most recent MELD 11. No presence of ascites on exam, no LE edema, confusion, jaundice or pruritus. hx of grade III esophageal varices with intolerance to previous beta blocker at both 227mand 4066mose. HR reportedly 65-70 at home when patient is checking it. Previous TIPS procedure discussed with patient and Dr. CasJenetta Downerowever, at that time, patient preferred to try beta blocker. Considering patient has not tolerated beta blocker for his esopahgeal varices,  I had discussion today regarding reconsideration of possible TIPS procedure. He is aware of potential life threatening complications of unmanaged esophageal varices/importance of appropriate management and aware of ER precautions included hematemesis/coffee ground emesis. Pt notably tells me that his father passed away from esophageal variceal bleed. Will discuss case with Dr. CasJenetta Downerr potential reconsideration of referral for TIPS procedure. Liver US [place] be due sept 2023 and MELD labs/INR/AFP will be due in October 2023.    PLAN:  -Liver US [place] Sept2023 - MELD/AFP/INR due Oct 2023 - Reduce salt intake to <2 g per day - Can take Tylenol max of 2 g per day (650 mg q8h) for pain - Avoid NSAIDs for pain - Avoid eating raw oysters/shellfish - Ensure every night before going to sleep - discuss reconsideration of TIPS procedure - pt aware if any coffee ground emesis/hematemesis, need to proceed to ER  All questions were answered, patient verbalized understanding and is in agreement with plan as outlined above.   Follow Up: 6 months   Kerry Odonohue L. CarAlver SorrowSN, APRN, AGNP-C Adult-Gerontology Nurse Practitioner [facility]

## 2021-11-28 NOTE — Patient Instructions (Signed)
-  Liver US due Sept 2023 - liver labs due Oct 2023 - Reduce salt intake to <2 g per day - Can take Tylenol max of 2 g per day (650 mg q8h) for pain - Avoid NSAIDs for pain - Avoid eating raw oysters/shellfish - Ensure every night before going to sleep  Continue your protonix 8m once daily  As discussed, given the presence of esophageal varices found on your last EGD and intolerance to the nadolol medication which helps control BP/HR to avoid pressure on the varices that can cause bleeding, I do feel it may be beneficial to readdress possible TIPS procedure. I will discuss this with Dr. CJenetta Downerto get his thoughts on this as well. As you know, any vomiting of blood or coffee ground like substance is a medical emergency and you should proceed to the ER immediately.   Follow up 6 months

## 2021-11-29 ENCOUNTER — Telehealth (INDEPENDENT_AMBULATORY_CARE_PROVIDER_SITE_OTHER): Payer: Self-pay | Admitting: Gastroenterology

## 2021-11-29 DIAGNOSIS — H2513 Age-related nuclear cataract, bilateral: Secondary | ICD-10-CM | POA: Diagnosis not present

## 2021-11-29 DIAGNOSIS — Z7984 Long term (current) use of oral hypoglycemic drugs: Secondary | ICD-10-CM | POA: Diagnosis not present

## 2021-11-29 DIAGNOSIS — E119 Type 2 diabetes mellitus without complications: Secondary | ICD-10-CM | POA: Diagnosis not present

## 2021-11-29 NOTE — Telephone Encounter (Signed)
I called the patient today to discuss the TIPS procedure in detail.  He told me that he has excessive research about the procedure and is aware of the risks and benefits.  For now he would like to hold off on that he will proceed with TIPS.  I emphasized about the importance of having some sort of prevention treatment for his gastroesophageal varices given that risk of massive life-threatening bleeding with this.  He understood and he stated he is open to restart lower dose beta-blocker in the future but he would like to think about it and give [place] a call. We also discussed the possibility of referring him to a quaternary center such as [facility] or [name] for evaluation for endoscopic ultrasound coiling of his gastric varices but he is not interested in undergoing his referral for now.

## 2021-12-11 DIAGNOSIS — E1165 Type 2 diabetes mellitus with hyperglycemia: Secondary | ICD-10-CM | POA: Diagnosis not present

## 2021-12-11 DIAGNOSIS — E0590 Thyrotoxicosis, unspecified without thyrotoxic crisis or storm: Secondary | ICD-10-CM | POA: Diagnosis not present

## 2021-12-11 DIAGNOSIS — K7460 Unspecified cirrhosis of liver: Secondary | ICD-10-CM | POA: Diagnosis not present

## 2021-12-11 DIAGNOSIS — Z125 Encounter for screening for malignant neoplasm of prostate: Secondary | ICD-10-CM | POA: Diagnosis not present

## 2021-12-14 DIAGNOSIS — D696 Thrombocytopenia, unspecified: Secondary | ICD-10-CM | POA: Diagnosis not present

## 2021-12-14 DIAGNOSIS — E663 Overweight: Secondary | ICD-10-CM | POA: Diagnosis not present

## 2021-12-14 DIAGNOSIS — E1165 Type 2 diabetes mellitus with hyperglycemia: Secondary | ICD-10-CM | POA: Diagnosis not present

## 2021-12-14 DIAGNOSIS — F5221 Male erectile disorder: Secondary | ICD-10-CM | POA: Diagnosis not present

## 2021-12-14 DIAGNOSIS — D61818 Other pancytopenia: Secondary | ICD-10-CM | POA: Diagnosis not present

## 2021-12-14 DIAGNOSIS — K219 Gastro-esophageal reflux disease without esophagitis: Secondary | ICD-10-CM | POA: Diagnosis not present

## 2021-12-14 DIAGNOSIS — I8500 Esophageal varices without bleeding: Secondary | ICD-10-CM | POA: Diagnosis not present

## 2021-12-14 DIAGNOSIS — Z0001 Encounter for general adult medical examination with abnormal findings: Secondary | ICD-10-CM | POA: Diagnosis not present

## 2021-12-14 DIAGNOSIS — Z125 Encounter for screening for malignant neoplasm of prostate: Secondary | ICD-10-CM | POA: Diagnosis not present

## 2021-12-14 DIAGNOSIS — E0590 Thyrotoxicosis, unspecified without thyrotoxic crisis or storm: Secondary | ICD-10-CM | POA: Diagnosis not present

## 2021-12-14 DIAGNOSIS — K7460 Unspecified cirrhosis of liver: Secondary | ICD-10-CM | POA: Diagnosis not present

## 2021-12-14 DIAGNOSIS — N3281 Overactive bladder: Secondary | ICD-10-CM | POA: Diagnosis not present

## 2022-01-23 ENCOUNTER — Encounter (INDEPENDENT_AMBULATORY_CARE_PROVIDER_SITE_OTHER): Payer: Self-pay

## 2022-01-23 ENCOUNTER — Ambulatory Visit (INDEPENDENT_AMBULATORY_CARE_PROVIDER_SITE_OTHER): Payer: PPO | Admitting: Gastroenterology

## 2022-01-23 ENCOUNTER — Encounter (INDEPENDENT_AMBULATORY_CARE_PROVIDER_SITE_OTHER): Payer: Self-pay | Admitting: Gastroenterology

## 2022-01-23 ENCOUNTER — Other Ambulatory Visit (INDEPENDENT_AMBULATORY_CARE_PROVIDER_SITE_OTHER): Payer: Self-pay

## 2022-01-23 VITALS — BP 116/75 | HR 80 | Temp 98.6°F | Ht 72.0 in | Wt 201.4 lb

## 2022-01-23 DIAGNOSIS — R1013 Epigastric pain: Secondary | ICD-10-CM | POA: Diagnosis not present

## 2022-01-23 DIAGNOSIS — R188 Other ascites: Secondary | ICD-10-CM | POA: Insufficient documentation

## 2022-01-23 DIAGNOSIS — G8929 Other chronic pain: Secondary | ICD-10-CM

## 2022-01-23 DIAGNOSIS — K7581 Nonalcoholic steatohepatitis (NASH): Secondary | ICD-10-CM

## 2022-01-23 DIAGNOSIS — R140 Abdominal distension (gaseous): Secondary | ICD-10-CM

## 2022-01-23 DIAGNOSIS — K7460 Unspecified cirrhosis of liver: Secondary | ICD-10-CM

## 2022-01-23 MED ORDER — SUCRALFATE 1 GM/10ML PO SUSP: 1.0000 g | Freq: Three times a day (TID) | ORAL | 1 refills | Status: DC

## 2022-01-23 NOTE — Progress Notes (Signed)
Referring Provider: Birdie Sons, MD Primary Care Physician:  Birdie Sons, MD Primary GI Physician: Jenetta Downer  Chief Complaint  Patient presents with   Abdominal Pain    Abdominal pain in mid abdomen. Started 3 -4 weeks ago. Constant pressure. Worse after eating.    HPI:   Stephen Burch is a 66 y.o. male with past medical history of NASH cirrhosis complicated by grade 3 esophgeal varices, Type II GOV varices, GERD complicated by Barrett's esophagus without dysplasia, hx of intraoperative cardiac arrest.   Patient presenting today for abdominal pain.  Last seen 11/18/21, at that time Patient reported feeling more tired and "like a zombie" on nadalol. States that he did not notice much improvement in BP and HR with increased dose to 51m daily. He stopped beta blocker a few prior. Notes his blood sugars also seemed to go down some after stopping this. He does regularly check HR and BP at home and States that BP has been in the 120s and HR between 65 and 70. He notes that even on the 240mdose of nadolol he felt more fatigued and without any energy. Felt he had noticed his abdomen looking slightly larger recently, though unsure if this may just be related to eating more and not working out as much. Denies swelling to his LEs, itching or jaundice. No episodes of confusion. Denies rectal bleeding or melena. No nausea or vomiting.  TIPS procedure was discussed with the patient by myself and further by Dr. CaJenetta Downerin depth, however, patient declined to undergo procedure at this time.    GERD well managed on protonix 4018mnce daily, no episodes of breakthrough reflux, dysphagia, odynophagia, early satiety.     Previous MELD: 11 Previous Child Pugh: Class A   Present:  Patient reports mid to upper abdominal pain x3-4 weeks, states he feels bloated and thought it might be gas, took gas x and tums.  tums provides some relief of his symptoms.sometimes abdomen feels tight. No swelling to  LEs. No episodes of confusion, jaundice or pruritus. He denies nausea or vomiting. Pain is worse after eating. Denies constipation or diarrhea, no blood in stools or melena. He denies heartburn or acid regurgitation. Denies new medications for anything. He feels that he has gained some weight, though he is going to the gym. Appetite is good, he denies early satiety or inability to finish his meals. Having 4-5 formed BMs per day. He has been maintained on pantoprazole for the past few years.    Last Colonoscopy:2021- Two small polyps in the sigmoid colon and in the ascending colon, removed with a cold snare. Resected and retrieved. - External hemorrhoids. - Biopsies were taken with a cold forceps from the ascending colon and sigmoid colon for evaluation of microscopic colitis-Negative  Last Endoscopy: 11/04/20 grade III esophageal varices, Type 2 gastroesophageal varices (GOV 2 esophageal varices which extend along the fundus) w/o bleeding, single gastric polyp  Repeat Colon in 2026  Past Medical History:  Diagnosis Date   Acute systolic (congestive) heart failure ([place]/30/2019   Arthritis    knees, wrists   Barrett's esophagus    Cardiac arrest ([place]21/85/6314Complication of anesthesia    02-22-2016 intraop cardiac arrest immediately after vancomyocin administration , pt cardiac arrest w/ Vfib,  please refer to anesthesia record in epic   Dysrhythmia    Afib 12/2018 - converted to NSR   Esophageal varices determined by [place]oscopy ([place]   grade 1  GERD (gastroesophageal reflux disease)    Hiatal hernia    History of adenomatous polyp of colon    History of asthma    as child   History of DVT (deep vein thrombosis)    right lower extremitty post-op right total knee surgery 09/ 2010,  treated w/ coumadin   History of esophageal dilatation 07/1998   for schatzski ring   History of kidney stones    History of staph infection 04/2016   MSSA infection of right total knee w/  sepsis   Hx of cardiac arrest    02-22-2016  intraoperative, immediately following moving pt into prone position and vancomyocin administration, cardiac arrest w/ Vfib (referred to anesthesia record in epic) pt extubated himself next day   Infection of prosthetic right knee joint ([place])    Liver cirrhosis secondary to NASH (nonalcoholic steatohepatitis) ([place])    NAFLD-- followed by dr Laural Golden---  liver bx 09-30-2012  mild portal and focal sinusoidal fibrosis   Pre-diabetes    Pyogenic arthritis of right knee joint ([place]) 04/02/2016   Scapholunate advanced collapse of left wrist    deformity   Shock circulatory ([place])    Staphylococcus aureus bacteremia with sepsis ([place])    Thrombocytopenia ([place])    10-09-2017 per pt his platelets have always been low , followed by pcp, never been referred to hematologist    Past Surgical History:  Procedure Laterality Date   APPLICATION OF WOUND VAC Right 02/22/2016   Procedure: APPLICATION OF WOUND VAC;  Surgeon: Vickey Huger, MD;  Location: [place];  Service: Orthopedics;  Laterality: Right;   BIOPSY  02/10/2020   Procedure: BIOPSY;  Surgeon: Rogene Houston, MD;  [facility];  Service: Endoscopy;;   CARDIAC CATHETERIZATION N/A 02/24/2016   Procedure: Left Heart Cath and Coronary Angiography;  Surgeon: Burnell Blanks, MD;  Location: [facility];  Service: Cardiovascular;  Laterality: N/A;  No angiographic evidence of CAD,  normal LVSF, ef 50-55%   CARPECTOMY Left 10/17/2017   Procedure: LEFT WRIST PROXIMAL ROW CARPECTOMY WITH POSTEROR IMBROSSEOUS NERVE EXCISION;  Surgeon: Charlotte Crumb, MD;  Location: [place];  Service: Orthopedics;  Laterality: Left;  AXILLARY BLOCK   CARPECTOMY WITH RADIAL STYLOIDECTOMY Left 02/11/2019   Procedure: LEFT WRIST RADIAL STYLOIDECTOMY;  Surgeon: Charlotte Crumb, MD;  Location: [place];  Service: Orthopedics;  Laterality: Left;   COLONOSCOPY     COLONOSCOPY WITH PROPOFOL N/A 02/10/2020    Procedure: COLONOSCOPY WITH PROPOFOL;  Surgeon: Rogene Houston, MD;  [facility];  Service: Endoscopy;  Laterality: N/A;  955   EAR CYST EXCISION Right 09/06/2014   Procedure: OPEN EXCISION BAKER'S CYST RIGHT KNEE;  Surgeon: Vickey Huger, MD;  Location: [place];  Service: Orthopedics;  Laterality: Right;   ESOPHAGEAL BANDING  06/30/2020   Procedure: ESOPHAGEAL BANDING;  Surgeon: Rogene Houston, MD;  [facility];  Service: Endoscopy;;   ESOPHAGEAL DILATION  1996/ 2000   ESOPHAGOGASTRODUODENOSCOPY N/A 09/17/2012   Procedure: ESOPHAGOGASTRODUODENOSCOPY (EGD);  Surgeon: Rogene Houston, MD;  [facility];  Service: Endoscopy;  Laterality: N/A;  200   ESOPHAGOGASTRODUODENOSCOPY N/A 12/15/2015   Procedure: ESOPHAGOGASTRODUODENOSCOPY (EGD);  Surgeon: Rogene Houston, MD;  [facility];  Service: Endoscopy;  Laterality: N/A;  1245   ESOPHAGOGASTRODUODENOSCOPY N/A 12/17/2016   Procedure: ESOPHAGOGASTRODUODENOSCOPY (EGD);  Surgeon: Rogene Houston, MD;  [facility];  Service: Endoscopy;  Laterality: N/A;  210   ESOPHAGOGASTRODUODENOSCOPY N/A  06/30/2020   Procedure: ESOPHAGOGASTRODUODENOSCOPY (EGD);  Surgeon: Rogene Houston, MD;  [facility];  Service: Endoscopy;  Laterality: N/A;  11:15   ESOPHAGOGASTRODUODENOSCOPY (EGD) WITH PROPOFOL N/A 02/10/2020   Procedure: ESOPHAGOGASTRODUODENOSCOPY (EGD) WITH PROPOFOL;  Surgeon: Rogene Houston, MD;  [facility];  Service: Endoscopy;  Laterality: N/A;   ESOPHAGOGASTRODUODENOSCOPY (EGD) WITH PROPOFOL N/A 11/04/2020   Procedure: ESOPHAGOGASTRODUODENOSCOPY (EGD) WITH PROPOFOL;  Surgeon: Harvel Quale, MD;  [facility];  Service: Gastroenterology;  Laterality: N/A;  9:45   I & D KNEE WITH POLY EXCHANGE Right 04/02/2016   Procedure: IRRIGATION AND DEBRIDEMENT RIGHT KNEE WITH POLY EXCHANGE;  Surgeon: Vickey Huger, MD;  Location: [place];  Service: Orthopedics;  Laterality:  Right;   INCISION AND DRAINAGE HEMATOMA POST LEFT  TOTAL KNEE  2012   INGUINAL HERNIA REPAIR Bilateral 05/22/2013   Procedure: REPAIR OF RECURRENT INCARCERATED INGUINAL HERNIA WITH MESH RIGHT SIDE,  REPAIR OF RECURRENT INGUINAL HERNIA WITH MESH LEFT SIDE;  Surgeon: [name], MD;  Location: [place];  Service: General;  Laterality: Bilateral;   INGUINAL HERNIA REPAIR Bilateral 11/1997   INSERTION OF MESH Bilateral 05/22/2013   Procedure: INSERTION OF MESH;  Surgeon: [name], MD;  Location: [place];  Service: General;  Laterality: Bilateral;   IRRIGATION AND DEBRIDEMENT KNEE Right 02/22/2016   Procedure: IRRIGATION AND DEBRIDEMENT KNEE;  Surgeon: Vickey Huger, MD;  Location: [place];  Service: Orthopedics;  Laterality: Right;   Irrigation and Debridement right knee  03/23/2009   Dr. Sabra Heck, [place] ARTHROSCOPY W/ SYNOVECTOMY Right 01/30/2010   AND DEBRIDEMENT OF HETEROTOPIC   LIVER BIOPSY  09/30/2012   REVISION TOTAL KNEE ARTHROPLASTY Left fall 2012   TONSILLECTOMY  child   TOTAL KNEE ARTHROPLASTY Bilateral right 09/ 2010/  left 09-18-2010   TRANSTHORACIC ECHOCARDIOGRAM  02/22/2016   ef 30-35% (per cardiac cath 02-24-2016 normal), severe hypokinesis of the mid-apicalanteroseptal and apical myocardium,  grade 1 diastolic dysfunction/ trivial PR and TR   TRIGGER FINGER RELEASE Left 02/11/2019   Procedure: LEFT WRIST STENOSING TENOSYNOVITIS RELEASE;  Surgeon: Charlotte Crumb, MD;  Location: [place];  Service: Orthopedics;  Laterality: Left;  MAC WITH AXILLARY BLOCK   WRIST ARTHROPLASTY Left 10/17/2017   Procedure: CAPITATE RESURFACING ARTHROPLASTY;  Surgeon: Charlotte Crumb, MD;  [facility];  Service: Orthopedics;  Laterality: Left;    Current Outpatient Medications  Medication Sig Dispense Refill   EPINEPHrine (PRIMATENE MIST IN) Inhale into the lungs.     glipiZIDE (GLUCOTROL XL) 2.5 MG 24 hr tablet Take 1 tablet by mouth once daily with breakfast 90  tablet 4   methimazole (TAPAZOLE) 10 MG tablet Take 15 mg by mouth daily.     OVER THE COUNTER MEDICATION B 12 500 mcg two every day.     pantoprazole (PROTONIX) 40 MG tablet Take 1 tablet (40 mg total) by mouth daily before breakfast. 90 tablet 3   No current facility-administered medications for this visit.    Allergies as of 01/23/2022 - Review Complete 01/23/2022  Allergen Reaction Noted   Asa [aspirin] Shortness Of Breath 04/01/2016   Penicillins Shortness Of Breath 07/04/2012   Vancomycin Anaphylaxis 02/22/2016    Family History  Problem Relation Age of Onset   Arthritis Father        died age 24   GI Bleed Father        upper GI Bleed, non-ETOH cirrhosis   Arthritis Brother  Diabetes Brother        type 2   Diabetes Paternal Uncle        type 2   Heart attack Maternal Grandmother    Heart attack Maternal Grandfather    Colon cancer Neg Hx    Colon polyps Neg Hx     Social History   Socioeconomic History   Marital status: Legally Separated    Spouse name: Not on file   Number of children: 2   Years of education: Not on file   Highest education level: 12th grade  Occupational History    Employer: [employer]    Comment: part time  Tobacco Use   Smoking status: Former    Packs/day: 0.25    Years: 3.00    Total pack years: 0.75    Types: Cigarettes    Quit date: 10/10/1974    Years since quitting: 47.3    Passive exposure: Past   Smokeless tobacco: Never  Vaping Use   Vaping Use: Never used  Substance and Sexual Activity   Alcohol use: Not Currently   Drug use: No   Sexual activity: Yes  Other Topics Concern   Not on file  Social History Narrative   ** Merged History Encounter **       Pt lives alone.   Social Determinants of Health   Financial Resource Strain: Low Risk  (11/16/2019)   Overall Financial Resource Strain (CARDIA)    Difficulty of Paying Living Expenses: Not hard at all  Food Insecurity: No Food Insecurity (02/28/2021)   Hunger  Vital Sign    Worried About Running Out of Food in the Last Year: Never true    Ran Out of Food in the Last Year: Never true  Transportation Needs: No Transportation Needs (02/28/2021)   PRAPARE - Hydrologist (Medical): No    Lack of Transportation (Non-Medical): No  Physical Activity: Inactive (11/16/2019)   Exercise Vital Sign    Days of Exercise per Week: 0 days    Minutes of Exercise per Session: 0 min  Stress: No Stress Concern Present (11/16/2019)   [place]    Feeling of Stress : Not at all  Social Connections: Moderately Isolated (11/16/2019)   Social Connection and Isolation Panel [NHANES]    Frequency of Communication with Friends and Family: More than three times a week    Frequency of Social Gatherings with Friends and Family: More than three times a week    Attends Religious Services: More than 4 times per year    Active Member of [employer] or Organizations: No    Attends Music therapist: Never    Marital Status: Separated   Review of systems General: negative for malaise, night sweats, fever, chills, weight loss Neck: Negative for lumps, goiter, pain and significant neck swelling Resp: Negative for cough, wheezing, dyspnea at rest CV: Negative for chest pain, leg swelling, palpitations, orthopnea GI: denies melena, hematochezia, nausea, vomiting, diarrhea, constipation, dysphagia, odyonophagia, early satiety or unintentional weight loss. +epigastric pain +bloating  MSK: Negative for joint pain or swelling, back pain, and muscle pain. Derm: Negative for itching or rash Psych: Denies depression, anxiety, memory loss, confusion. No homicidal or suicidal ideation.  Heme: Negative for prolonged bleeding, bruising easily, and swollen nodes. Endocrine: Negative for cold or heat intolerance, polyuria, polydipsia and goiter. Neuro: negative for tremor, gait imbalance, syncope  and seizures. The remainder of the review of  systems is noncontributory.  Physical Exam: BP 116/75 (BP Location: Left Arm, Patient Position: Sitting, Cuff Size: Large)   Pulse 80   Temp 98.6 F (37 C) (Oral)   Ht 6' (1.829 m)   Wt 201 lb 6.4 oz (91.4 kg)   BMI 27.31 kg/m  General:   Alert and oriented. No distress noted. Pleasant and cooperative.  Head:  Normocephalic and atraumatic. Eyes:  Conjuctiva clear without scleral icterus. Mouth:  Oral mucosa pink and moist. Good dentition. No lesions. Heart: Normal rate and rhythm, s1 and s2 heart sounds present.  Lungs: Clear lung sounds in all lobes. Respirations equal and unlabored. Abdomen:  +BS, non tender, abdomen is full but soft, moderate ascites present.  No rebound or guarding. No HSM or masses noted. Derm: No palmar erythema or jaundice Msk:  Symmetrical without gross deformities. Normal posture. Extremities:  Without edema. Neurologic:  Alert and  oriented x4 Psych:  Alert and cooperative. Normal mood and affect.  Invalid input(s): "6 MONTHS"   ASSESSMENT: Stephen Burch is a 66 y.o. male presenting today with epigastric pain and bloating.  Epigastric pain and bloating with worsening pain post prandially, some improvement with tums. Notes that abdomen feels very full and almost tight at times though abdominal exam today is without any tenderness, moderate ascites, some fullness but non taut. He denies nausea, vomiting, early satiety, melena or rectal bleeding. No NSAID use. Denies GERD symptoms. Recommend scheduling EGD for further evaluation as we cannot rule out PUD, duodenitis, gastritis, malignancy much less likely given recent EGD in May 2022. Continue PPI and add Rx Carafate 1g QID for symptomatic relief. Indications, risks and benefits of procedure discussed in detail with patient. Patient verbalized understanding and is in agreement to proceed with EGD at this time.   Given new onset of ascites in presence of known  cirrhosis, patient would likely benefit from low dose lasix/spironolactone. Patient reports recent lab work with PCP,Will obtain these evaluate renal function and electrolytes prior to initiation of these. If WNL, will proceed with lasix 85m and Spironolactone 1065mdaily with repeat BMP 1-2 weeks after start of these. He denies any episodes of confusion, pruritus or jaundice, last MELD in May was 11.    PLAN:  Schedule EGD-ASA III 2. Rx carafate 1g QID   3. Continue protonix 4. obtain labs from PCP 5. Plan to start low dose lasix/spironolactone if renal fx and electrolytes are WNL 6. Repeat BMP 1-2 week after starting diuretic therapy  All questions were answered, patient verbalized understanding and is in agreement with plan as outlined above.   Follow Up: Has f/u in NOv  Willadean Guyton L. CaAlver SorrowMSN, APRN, AGNP-C Adult-Gerontology Nurse Practitioner [facility]

## 2022-01-23 NOTE — Patient Instructions (Signed)
We will get you scheduled for upper endoscopy for further evaluation of your pain Please continue pantoprazole Avoid NSAIDs, greasy, spicy, citrus and tomato based foods I have sent carafate for you to take before meals and at bedtime, this will help to coat the GI tract and should provide some symptomatic relief of your pain You have some fluid build up in your abdomen, I will try to get most recent labs from your PCP and will likely start you on some low dose diuretics to help keep the fluid at bay

## 2022-01-26 ENCOUNTER — Telehealth (INDEPENDENT_AMBULATORY_CARE_PROVIDER_SITE_OTHER): Payer: Self-pay | Admitting: *Deleted

## 2022-01-26 NOTE — Telephone Encounter (Signed)
Patient seen recently ( 7/25) and brought in labs for you to review.

## 2022-01-29 DIAGNOSIS — K7581 Nonalcoholic steatohepatitis (NASH): Secondary | ICD-10-CM | POA: Diagnosis not present

## 2022-01-29 DIAGNOSIS — K7460 Unspecified cirrhosis of liver: Secondary | ICD-10-CM | POA: Diagnosis not present

## 2022-01-30 ENCOUNTER — Other Ambulatory Visit (INDEPENDENT_AMBULATORY_CARE_PROVIDER_SITE_OTHER): Payer: Self-pay | Admitting: Gastroenterology

## 2022-01-30 DIAGNOSIS — R188 Other ascites: Secondary | ICD-10-CM

## 2022-01-30 MED ORDER — SPIRONOLACTONE 100 MG PO TABS: 100.0000 mg | ORAL_TABLET | Freq: Every day | ORAL | 1 refills | Status: DC

## 2022-01-30 MED ORDER — FUROSEMIDE 40 MG PO TABS: 40.0000 mg | ORAL_TABLET | Freq: Every day | ORAL | 1 refills | Status: DC

## 2022-01-30 NOTE — Telephone Encounter (Signed)
[name] at patient pcp at [facility] [phone] states the patient had contacted them regarding his issues with swelling in abdomin and wanted [place] to follow up with the patient. I call Stephen Burch and he says he had an appointment last week here with Stephen Gerlach, NP. He says his abdomin is getting tight and he felt he needed a Diuretic sent in He denies any swelling in extremities  or shortness of breath and is not in any acute distress. He brought labs by the office last week for [place] to review. I have removed them from [name]'s desk and placed them on Dr. Colman Cater desk for review. Please advise as to what patient needs to have done. If Diuretics sent in would like it sent to [facility]

## 2022-01-30 NOTE — Telephone Encounter (Signed)
Received most recent labs from 12/11/2021 which showed TSH of 0.0 84, white blood cell count of 2.3, hemoglobin 12.7, platelets 75,000, CMP with sodium of 141, creatinine 0.86, BUN 12, potassium 4.1, chloride 106, total bilirubin 2.9, alkaline phosphatase 174, AST 36, ALT 30 Will start patient on furosemide 40 mg every day and spironolactone 100 mg daily.  He will need to have a repeat BMP in 1 week and follow-up appointment 2 weeks (Mitzie, please schedule him for this) May need to consider paracentesis with evaluation for SBP if persistent distention.  May also need to check cell count, cytology, albumin, protein, Gram and culture. Darius Bump, please schedule a Doppler of the liver? Dx: New ascites, rule out portal vein thrombosis.   Thanks,  [name], MD Gastroenterology and Hepatology [facility]

## 2022-01-31 ENCOUNTER — Other Ambulatory Visit: Payer: Self-pay

## 2022-01-31 ENCOUNTER — Other Ambulatory Visit (INDEPENDENT_AMBULATORY_CARE_PROVIDER_SITE_OTHER): Payer: Self-pay

## 2022-01-31 DIAGNOSIS — K7460 Unspecified cirrhosis of liver: Secondary | ICD-10-CM

## 2022-01-31 DIAGNOSIS — R188 Other ascites: Secondary | ICD-10-CM

## 2022-01-31 DIAGNOSIS — R1013 Epigastric pain: Secondary | ICD-10-CM

## 2022-01-31 NOTE — Telephone Encounter (Signed)
Stephen Burch patient needs appointment per Dr. Jenetta Downer for the next two weeks.   Patient made aware of all . He will have BMET drawn around [facility]/06/2022 at [facility]. Lab orders mailed to him he is also aware we have sent in two new medication for him and aware we are working on getting him in the office soon.

## 2022-02-05 DIAGNOSIS — R1013 Epigastric pain: Secondary | ICD-10-CM | POA: Diagnosis not present

## 2022-02-05 DIAGNOSIS — K7581 Nonalcoholic steatohepatitis (NASH): Secondary | ICD-10-CM | POA: Diagnosis not present

## 2022-02-05 DIAGNOSIS — K7460 Unspecified cirrhosis of liver: Secondary | ICD-10-CM | POA: Diagnosis not present

## 2022-02-06 ENCOUNTER — Ambulatory Visit: Payer: Self-pay

## 2022-02-06 LAB — BASIC METABOLIC PANEL
BUN: 10 mg/dL (ref 7–25)
CO2: 28 mmol/L (ref 20–32)
Calcium: 8.7 mg/dL (ref 8.6–10.3)
Chloride: 104 mmol/L (ref 98–110)
Creat: 0.88 mg/dL (ref 0.70–1.35)
Glucose, Bld: 91 mg/dL (ref 65–99)
Potassium: 4.2 mmol/L (ref 3.5–5.3)
Sodium: 141 mmol/L (ref 135–146)

## 2022-02-06 NOTE — Chronic Care Management (AMB) (Signed)
02/06/2022  Stephen Burch [birth year] [id number]  Documentation encounter created to complete case transition. The care management team will follow for care coordination as needed.   [facility] Care Management [phone]

## 2022-02-07 ENCOUNTER — Ambulatory Visit (HOSPITAL_COMMUNITY)
Admission: RE | Admit: 2022-02-07 | Discharge: 2022-02-07 | Disposition: A | Discharge status: Home / Self Care | Payer: PPO | Source: Ambulatory Visit | Attending: Gastroenterology | Admitting: Gastroenterology

## 2022-02-07 DIAGNOSIS — K7581 Nonalcoholic steatohepatitis (NASH): Secondary | ICD-10-CM | POA: Diagnosis not present

## 2022-02-07 DIAGNOSIS — R188 Other ascites: Secondary | ICD-10-CM | POA: Diagnosis not present

## 2022-02-07 DIAGNOSIS — K7460 Unspecified cirrhosis of liver: Secondary | ICD-10-CM | POA: Diagnosis not present

## 2022-02-07 DIAGNOSIS — R161 Splenomegaly, not elsewhere classified: Secondary | ICD-10-CM | POA: Diagnosis not present

## 2022-02-08 ENCOUNTER — Ambulatory Visit (INDEPENDENT_AMBULATORY_CARE_PROVIDER_SITE_OTHER): Payer: PPO | Admitting: Gastroenterology

## 2022-02-08 ENCOUNTER — Encounter (INDEPENDENT_AMBULATORY_CARE_PROVIDER_SITE_OTHER): Payer: Self-pay | Admitting: Gastroenterology

## 2022-02-08 VITALS — BP 144/73 | HR 81 | Temp 98.1°F | Ht 72.0 in | Wt 194.5 lb

## 2022-02-08 DIAGNOSIS — K7581 Nonalcoholic steatohepatitis (NASH): Secondary | ICD-10-CM | POA: Diagnosis not present

## 2022-02-08 DIAGNOSIS — K7460 Unspecified cirrhosis of liver: Secondary | ICD-10-CM

## 2022-02-08 DIAGNOSIS — R188 Other ascites: Secondary | ICD-10-CM | POA: Diagnosis not present

## 2022-02-08 NOTE — Patient Instructions (Addendum)
Given that you are still having swelling and your labs looked stable, we will increase your lasix to 30m per day and spironolactone to 2063mdaily, you can double up on the pills that you have, if these doses work for you, we will send updated dosing with your next refill. At this time I do not think there is enough fluid to be drawn off, if you feel that abdomen is becoming hard, tight or you have shortness of breath or are unable to eat due to this, please make aware as we may need to send you for a paracentesis.  As we discussed, the TIPS procedure would likely be very beneficial for you at this point as it would help to reduce pressure on your esophageal varices and help with fluid in your abdomen. You are at high risk for life threatening events to include bleeding of those varices and an infection in the fluid accumulation in your abdomen. Please re consider the TIPS procedure, if you have any questions, we will be happy to further discuss this with you   Follow up 1 month

## 2022-02-08 NOTE — Progress Notes (Signed)
Referring Provider: Birdie Sons, MD Primary Care Physician:  Celene Squibb, MD Primary GI Physician: Jenetta Downer  Chief Complaint  Patient presents with   Follow-up    Patient here today for a follow up on Liver doppler done yesterday at [facility]. He does have some acites present in abdomen. Patient denies any current Gi issues.   HPI:   Stephen Burch is a 66 y.o. male with past medical history of NASH cirrhosis complicated by grade 3 esophgeal varices, Type II GOV varices, GERD complicated by Barrett's esophagus without dysplasia, hx of intraoperative cardiac arrest.    Patient presenting today for follow up after initial start of diuretics  History: last seen 01/23/22, Previous MELD: 11 Having mid to upper abdominal pain x3-4 weeks, states he feels bloated and thought it might be gas, took gas x and tums. tums provides some relief of his symptoms. sometimes abdomen feels tight. No swelling to LEs. No episodes of confusion, jaundice or pruritus. He denies nausea or vomiting. Pain is worse after eating.  He feels that he has gained some weight, though he is going to the gym. Appetite is good, he denies early satiety or inability to finish his meals. Having 4-5 formed BMs per day. He has been maintained on pantoprazole for the past few years.    Advised to schedule EGD, Rx carafate 1g QID, continue PPI, obtain labs from PCP, start low dose lasix/spironolactone if electrolytes WNL, BMP 1-2 weeks after diuretic initiaion.  Labs from 12/11/2021 reviewed by Dr. Jenetta Downer, with TSH of 0.0 84, white blood cell count of 2.3, hemoglobin 12.7, platelets 75,000, CMP with sodium of 141, creatinine 0.86, BUN 12, potassium 4.1, chloride 106, total bilirubin 2.9, alkaline phosphatase 174, AST 36, ALT 30  patient started on furosemide 40 mg every day and spironolactone 100 mg daily On 8/1 Liver doppler done 02/07/22 with Patent portal vein without evidence of thrombus or abnormal portal flow direction. No  significant reduction in portal vein velocities to suggest significant portal hypertension. Ascites in the right upper quadrant. Massive splenomegaly  Repeat BMP with sodium 141, potassium 4.2 on 02/05/22, MELD 11  Present: Today, patient states that he does not feel that swelling to abdomen is much better. States abdomen goes down usually in the mornings, but will become larger again after eating, sometimes tighter feeling but usually not taut. He denies any shortness of breath.  No swelling to LEs, denies changes to skin color, itching or confusion. Abdominal pain improved from last visit though appetite is still not great, usually eats 2 meals per day.    Notably down 7 pounds since visit on 01/23/22.  Cirrhosis related questions: Hematemesis/coffee ground emesis: no History of variceal bleeding: yes Abdominal pain: no Abdominal distention/worsening ascites: ascites Fever/chills: no Episodes of confusion/disorientation: no Taking diuretics?:lasix 33m spironolacton 1069mdaily Prior history of banding?: no Beta blockers? No, pt did not tolerate Prior episodes of SBP: no   Last Colonoscopy:2021- Two small polyps in the sigmoid colon and in the ascending colon, removed with a cold snare. Resected and retrieved. - External hemorrhoids. - Biopsies were taken with a cold forceps from the ascending colon and sigmoid colon for evaluation of microscopic colitis-Negative  Last Endoscopy: 11/04/20 grade III esophageal varices, Type 2 gastroesophageal varices (GOV 2 esophageal varices which extend along the fundus) w/o bleeding, single gastric polyp   Repeat Colon in 2026  Past Medical History:  Diagnosis Date   Acute systolic [place]est[place]  Arthritis    knees, wrists   Barrett's esophagus    Cardiac arrest ([place]) 1/69/6789   Complication of anesthesia    02-22-2016 intraop cardiac arrest immediately after vancomyocin administration , pt cardiac arrest w/ Vfib,   please refer to anesthesia record in epic   Dysrhythmia    Afib 12/2018 - converted to NSR   Esophageal varices determined by endoscopy ([place]) 11/2016   grade 1   GERD (gastroesophageal reflux disease)    Hiatal hernia    History of adenomatous polyp of colon    History of asthma    as child   History of DVT (deep vein thrombosis)    right lower extremitty post-op right total knee surgery 09/ 2010,  treated w/ coumadin   History of esophageal dilatation 07/1998   for schatzski ring   History of kidney stones    History of staph infection 04/2016   MSSA infection of right total knee w/ sepsis   Hx of cardiac arrest    02-22-2016  intraoperative, immediately following moving pt into prone position and vancomyocin administration, cardiac arrest w/ Vfib (referred to anesthesia record in epic) pt extubated himself next day   Infection of prosthetic right knee joint ([place])    Liver cirrhosis secondary to NASH (nonalcoholic steatohepatitis) ([place])    NAFLD-- followed by dr Laural Golden---  liver bx 09-30-2012  mild portal and focal sinusoidal fibrosis   Pre-diabetes    Pyogenic arthritis of right knee joint ([place]) 04/02/2016   Scapholunate advanced collapse of left wrist    deformity   Shock circulatory ([place])    Staphylococcus aureus bacteremia with sepsis ([place])    Thrombocytopenia ([place])    10-09-2017 per pt his platelets have always been low , followed by pcp, never been referred to hematologist    Past Surgical History:  Procedure Laterality Date   APPLICATION OF WOUND VAC Right 02/22/2016   Procedure: APPLICATION OF WOUND VAC;  Surgeon: Vickey Huger, MD;  Location: [place];  Service: Orthopedics;  Laterality: Right;   BIOPSY  02/10/2020   Procedure: BIOPSY;  Surgeon: Rogene Houston, MD;  [facility];  Service: Endoscopy;;   CARDIAC CATHETERIZATION N/A 02/24/2016   Procedure: Left Heart Cath and Coronary Angiography;  Surgeon: Burnell Blanks, MD;  Location: [place] [facility];   Service: Cardiovascular;  Laterality: N/A;  No angiographic evidence of CAD,  normal LVSF, ef 50-55%   CARPECTOMY Left 10/17/2017   Procedure: LEFT WRIST PROXIMAL ROW CARPECTOMY WITH POSTEROR IMBROSSEOUS NERVE EXCISION;  Surgeon: Charlotte Crumb, MD;  Location: [place];  Service: Orthopedics;  Laterality: Left;  AXILLARY BLOCK   CARPECTOMY WITH RADIAL STYLOIDECTOMY Left 02/11/2019   Procedure: LEFT WRIST RADIAL STYLOIDECTOMY;  Surgeon: Charlotte Crumb, MD;  Location: [place];  Service: Orthopedics;  Laterality: Left;   COLONOSCOPY     COLONOSCOPY WITH PROPOFOL N/A 02/10/2020   Procedure: COLONOSCOPY WITH PROPOFOL;  Surgeon: Rogene Houston, MD;  [facility];  Service: Endoscopy;  Laterality: N/A;  955   EAR CYST EXCISION Right 09/06/2014   Procedure: OPEN EXCISION BAKER'S CYST RIGHT KNEE;  Surgeon: Vickey Huger, MD;  Location: [place];  Service: Orthopedics;  Laterality: Right;   ESOPHAGEAL BANDING  06/30/2020   Procedure: ESOPHAGEAL BANDING;  Surgeon: Rogene Houston, MD;  [facility];  Service: Endoscopy;;   ESOPHAGEAL DILATION  1996/ 2000   ESOPHAGOGASTRODUODENOSCOPY N/A 09/17/2012   Procedure: ESOPHAGOGASTRODUODENOSCOPY (EGD);  Surgeon: Rogene Houston, MD;  [facility];  Service: Endoscopy;  Laterality: N/A;  200   ESOPHAGOGASTRODUODENOSCOPY N/A 12/15/2015   Procedure: ESOPHAGOGASTRODUODENOSCOPY (EGD);  Surgeon: Rogene Houston, MD;  [facility];  Service: Endoscopy;  Laterality: N/A;  1245   ESOPHAGOGASTRODUODENOSCOPY N/A 12/17/2016   Procedure: ESOPHAGOGASTRODUODENOSCOPY (EGD);  Surgeon: Rogene Houston, MD;  [facility];  Service: Endoscopy;  Laterality: N/A;  210   ESOPHAGOGASTRODUODENOSCOPY N/A 06/30/2020   Procedure: ESOPHAGOGASTRODUODENOSCOPY (EGD);  Surgeon: Rogene Houston, MD;  [facility];  Service: Endoscopy;  Laterality: N/A;  11:15   ESOPHAGOGASTRODUODENOSCOPY (EGD) WITH PROPOFOL N/A  02/10/2020   Procedure: ESOPHAGOGASTRODUODENOSCOPY (EGD) WITH PROPOFOL;  Surgeon: Rogene Houston, MD;  [facility];  Service: Endoscopy;  Laterality: N/A;   ESOPHAGOGASTRODUODENOSCOPY (EGD) WITH PROPOFOL N/A 11/04/2020   Procedure: ESOPHAGOGASTRODUODENOSCOPY (EGD) WITH PROPOFOL;  Surgeon: Harvel Quale, MD;  [facility];  Service: Gastroenterology;  Laterality: N/A;  9:45   I & D KNEE WITH POLY EXCHANGE Right 04/02/2016   Procedure: IRRIGATION AND DEBRIDEMENT RIGHT KNEE WITH POLY EXCHANGE;  Surgeon: Vickey Huger, MD;  Location: [place];  Service: Orthopedics;  Laterality: Right;   INCISION AND DRAINAGE HEMATOMA POST LEFT  TOTAL KNEE  2012   INGUINAL HERNIA REPAIR Bilateral 05/22/2013   Procedure: REPAIR OF RECURRENT INCARCERATED INGUINAL HERNIA WITH MESH RIGHT SIDE,  REPAIR OF RECURRENT INGUINAL HERNIA WITH MESH LEFT SIDE;  Surgeon: [name], MD;  Location: [place];  Service: General;  Laterality: Bilateral;   INGUINAL HERNIA REPAIR Bilateral 11/1997   INSERTION OF MESH Bilateral 05/22/2013   Procedure: INSERTION OF MESH;  Surgeon: [name], MD;  Location: [place];  Service: General;  Laterality: Bilateral;   IRRIGATION AND DEBRIDEMENT KNEE Right 02/22/2016   Procedure: IRRIGATION AND DEBRIDEMENT KNEE;  Surgeon: Vickey Huger, MD;  Location: [place];  Service: Orthopedics;  Laterality: Right;   Irrigation and Debridement right knee  03/23/2009   Dr. Sabra Heck, [place] ARTHROSCOPY W/ SYNOVECTOMY Right 01/30/2010   AND DEBRIDEMENT OF HETEROTOPIC   LIVER BIOPSY  09/30/2012   REVISION TOTAL KNEE ARTHROPLASTY Left fall 2012   TONSILLECTOMY  child   TOTAL KNEE ARTHROPLASTY Bilateral right 09/ 2010/  left 09-18-2010   TRANSTHORACIC ECHOCARDIOGRAM  02/22/2016   ef 30-35% (per cardiac cath 02-24-2016 normal), severe hypokinesis of the mid-apicalanteroseptal and apical myocardium,  grade 1 diastolic dysfunction/ trivial PR and TR   TRIGGER FINGER RELEASE Left  02/11/2019   Procedure: LEFT WRIST STENOSING TENOSYNOVITIS RELEASE;  Surgeon: Charlotte Crumb, MD;  Location: [place];  Service: Orthopedics;  Laterality: Left;  MAC WITH AXILLARY BLOCK   WRIST ARTHROPLASTY Left 10/17/2017   Procedure: CAPITATE RESURFACING ARTHROPLASTY;  Surgeon: Charlotte Crumb, MD;  Location: [facility];  Service: Orthopedics;  Laterality: Left;    Current Outpatient Medications  Medication Sig Dispense Refill   EPINEPHrine (PRIMATENE MIST IN) Inhale into the lungs.     furosemide (LASIX) 40 MG tablet Take 1 tablet (40 mg total) by mouth daily. 90 tablet 1   glipiZIDE (GLUCOTROL XL) 2.5 MG 24 hr tablet Take 1 tablet by mouth once daily with breakfast 90 tablet 4   methimazole (TAPAZOLE) 10 MG tablet Take 15 mg by mouth daily.     pantoprazole (PROTONIX) 40 MG tablet Take 1 tablet (40 mg total) by mouth daily before breakfast. 90 tablet 3   sucralfate (CARAFATE) 1 GM/10ML suspension Take 10 mLs (1 g total) by mouth  4 (four) times daily -  with meals and at bedtime. 420 mL 1   No current facility-administered medications for this visit.    Allergies as of 02/08/2022 - Review Complete 02/08/2022  Allergen Reaction Noted   Asa [aspirin] Shortness Of Breath 04/01/2016   Penicillins Shortness Of Breath 07/04/2012   Vancomycin Anaphylaxis 02/22/2016    Family History  Problem Relation Age of Onset   Arthritis Father        died age 50   GI Bleed Father        upper GI Bleed, non-ETOH cirrhosis   Arthritis Brother    Diabetes Brother        type 2   Diabetes Paternal Uncle        type 2   Heart attack Maternal Grandmother    Heart attack Maternal Grandfather    Colon cancer Neg Hx    Colon polyps Neg Hx     Social History   Socioeconomic History   Marital status: Legally Separated    Spouse name: Not on file   Number of children: 2   Years of education: Not on file   Highest education level: 12th grade  Occupational History    Employer:  [employer]    Comment: part time  Tobacco Use   Smoking status: Former    Packs/day: 0.25    Years: 3.00    Total pack years: 0.75    Types: Cigarettes    Quit date: 10/10/1974    Years since quitting: 47.3    Passive exposure: Past   Smokeless tobacco: Never  Vaping Use   Vaping Use: Never used  Substance and Sexual Activity   Alcohol use: Not Currently   Drug use: No   Sexual activity: Yes  Other Topics Concern   Not on file  Social History Narrative   ** Merged History Encounter **       Pt lives alone.   Social Determinants of Health   Financial Resource Strain: Low Risk  (11/16/2019)   Overall Financial Resource Strain (CARDIA)    Difficulty of Paying Living Expenses: Not hard at all  Food Insecurity: No Food Insecurity (02/28/2021)   Hunger Vital Sign    Worried About Running Out of Food in the Last Year: Never true    Ran Out of Food in the Last Year: Never true  Transportation Needs: No Transportation Needs (02/28/2021)   PRAPARE - Hydrologist (Medical): No    Lack of Transportation (Non-Medical): No  Physical Activity: Inactive (11/16/2019)   Exercise Vital Sign    Days of Exercise per Week: 0 days    Minutes of Exercise per Session: 0 min  Stress: No Stress Concern Present (11/16/2019)   [place]    Feeling of Stress : Not at all  Social Connections: Moderately Isolated (11/16/2019)   Social Connection and Isolation Panel [NHANES]    Frequency of Communication with Friends and Family: More than three times a week    Frequency of Social Gatherings with Friends and Family: More than three times a week    Attends Religious Services: More than 4 times per year    Active Member of [employer] or Organizations: No    Attends Archivist Meetings: Never    Marital Status: Separated   Review of systems General: negative for malaise, night sweats, fever, chills, weight  loss Neck: Negative for lumps, goiter, pain and  significant neck swelling Resp: Negative for cough, wheezing, dyspnea at rest CV: Negative for chest pain, leg swelling, palpitations, orthopnea GI: denies melena, hematochezia, nausea, vomiting, diarrhea, constipation, dysphagia, odyonophagia, early satiety or unintentional weight loss. +swelling to abdomen MSK: Negative for joint pain or swelling, back pain, and muscle pain. Derm: Negative for itching or rash Psych: Denies depression, anxiety, memory loss, confusion. No homicidal or suicidal ideation.  Heme: Negative for prolonged bleeding, bruising easily, and swollen nodes. Endocrine: Negative for cold or heat intolerance, polyuria, polydipsia and goiter. Neuro: negative for tremor, gait imbalance, syncope and seizures. The remainder of the review of systems is noncontributory.  Physical Exam: BP (!) 144/73 (BP Location: Left Arm, Patient Position: Sitting, Cuff Size: Large)   Pulse 81   Temp 98.1 F (36.7 C) (Oral)   Ht 6' (1.829 m)   Wt 194 lb 8 oz (88.2 kg)   BMI 26.38 kg/m  General:   Alert and oriented. No distress noted. Pleasant and cooperative.  Head:  Normocephalic and atraumatic. Eyes:  Conjuctiva clear without scleral icterus. Mouth:  Oral mucosa pink and moist. Good dentition. No lesions. Heart: Normal rate and rhythm, s1 and s2 heart sounds present.  Lungs: Clear lung sounds in all lobes. Respirations equal and unlabored. Abdomen:  +BS, non-tender. Abdomen somewhat full with moderate ascites though soft and non taut. No rebound or guarding. No HSM or masses noted. Derm: No palmar erythema or jaundice Msk:  Symmetrical without gross deformities. Normal posture. Extremities:  Without edema. Neurologic:  Alert and  oriented x4, no asterixis Psych:  Alert and cooperative. Normal mood and affect.  Invalid input(s): "6 MONTHS"   ASSESSMENT: Stephen Burch is a 66 y.o. male presenting today for follow up of ascites after  initiation of diuretic therapy.  Patient seen on 01/23/22 with new onset ascites, labs from 6/12 with normal electrolytes, lasix 64m daily and spironolactone 1064mdaily started on 8/1, repeat BMP on 8/7 with K+ 4.2 and sodium 141. Patient reports ascites does not seem to be much better. Abdomen feels less swollen upon waking in the morning but gets larger as the day goes on, abdomen sometimes feels tighter but not usually taut. No LE edema, confusion, pruritus. No hematemesis. Abdominal exam with somewhat full abdomen and moderate ascites, though abdomen is soft and non taut, likely not enough ascites for paracentesis at this time. Will increase diuretics to lasix 804maily and spironolactone to 200 mg daily. Pt will make me aware if ascites is not improving with this increase, if he has swelling to LEs, shortness of breath or is not able to eat due to bloated feeling.   Discussed findings of [place] doppler with the patient.   I had a thorough discussion with the patient regarding recommendations of TIPS procedure, as previously discussed by Dr. CasJenetta Downeratient is still unsure if he wants to proceed with this. We discussed that TIPS could potentially help to reduce ascites and prevent possible bleeding of his esophageal varices, which is life threatening if bleeding of these occurs(pt aware that hematemesis is a medical emergency requiring him go to the ED immediately). We also discussed that with continued fluid accumulation he is at risk for developing SBP which can also be life threatening. Ultimately, I advised the patient that he has multiple potentialities of life threatening events and consideration of TIPS procedure for him at this stage is very important. Patient verbalzed understanding and advised he will think more about if he wants to proceed  with this.    PLAN:  -Increase lasix to 44m daily and spironolactone to 2034mdaily -Reconsider TIPS procedure -Pt to make me aware of worsening  ascites, consider paracentesis - Reduce salt intake to <2 g per day - Can take Tylenol max of 2 g per day (650 mg q8h) for pain - Avoid NSAIDs for pain - Avoid eating raw oysters/shellfish - Ensure every night before going to sleep -ER precautions for hematemesis   All questions were answered, patient verbalized understanding and is in agreement with plan as outlined above.   Follow Up: 1 month  Dakwan Pridgen L. CaAlver SorrowMSN, APRN, AGNP-C Adult-Gerontology Nurse Practitioner [facility]

## 2022-02-12 DIAGNOSIS — D61818 Other pancytopenia: Secondary | ICD-10-CM | POA: Diagnosis not present

## 2022-02-12 DIAGNOSIS — K7460 Unspecified cirrhosis of liver: Secondary | ICD-10-CM | POA: Diagnosis not present

## 2022-02-12 DIAGNOSIS — D696 Thrombocytopenia, unspecified: Secondary | ICD-10-CM | POA: Diagnosis not present

## 2022-02-12 DIAGNOSIS — K219 Gastro-esophageal reflux disease without esophagitis: Secondary | ICD-10-CM | POA: Diagnosis not present

## 2022-02-12 DIAGNOSIS — I8500 Esophageal varices without bleeding: Secondary | ICD-10-CM | POA: Diagnosis not present

## 2022-02-12 DIAGNOSIS — E0590 Thyrotoxicosis, unspecified without thyrotoxic crisis or storm: Secondary | ICD-10-CM | POA: Diagnosis not present

## 2022-02-12 DIAGNOSIS — Z125 Encounter for screening for malignant neoplasm of prostate: Secondary | ICD-10-CM | POA: Diagnosis not present

## 2022-02-19 ENCOUNTER — Encounter (HOSPITAL_COMMUNITY): Payer: PPO

## 2022-02-22 ENCOUNTER — Ambulatory Visit (HOSPITAL_COMMUNITY): Admission: RE | Admit: 2022-02-22 | Payer: PPO | Source: Home / Self Care | Admitting: Gastroenterology

## 2022-02-22 ENCOUNTER — Encounter (HOSPITAL_COMMUNITY): Admission: RE | Payer: Self-pay | Source: Home / Self Care

## 2022-02-22 SURGERY — ESOPHAGOGASTRODUODENOSCOPY (EGD) WITH PROPOFOL
Anesthesia: Monitor Anesthesia Care

## 2022-03-15 ENCOUNTER — Other Ambulatory Visit (INDEPENDENT_AMBULATORY_CARE_PROVIDER_SITE_OTHER): Payer: Self-pay

## 2022-03-15 ENCOUNTER — Encounter (INDEPENDENT_AMBULATORY_CARE_PROVIDER_SITE_OTHER): Payer: Self-pay | Admitting: Gastroenterology

## 2022-03-15 ENCOUNTER — Ambulatory Visit (INDEPENDENT_AMBULATORY_CARE_PROVIDER_SITE_OTHER): Payer: PPO | Admitting: Gastroenterology

## 2022-03-15 VITALS — BP 126/72 | HR 79 | Temp 98.5°F | Ht 72.0 in | Wt 180.4 lb

## 2022-03-15 DIAGNOSIS — K7460 Unspecified cirrhosis of liver: Secondary | ICD-10-CM

## 2022-03-15 DIAGNOSIS — K7581 Nonalcoholic steatohepatitis (NASH): Secondary | ICD-10-CM | POA: Diagnosis not present

## 2022-03-15 DIAGNOSIS — I8500 Esophageal varices without bleeding: Secondary | ICD-10-CM

## 2022-03-15 DIAGNOSIS — K2270 Barrett's esophagus without dysplasia: Secondary | ICD-10-CM

## 2022-03-15 DIAGNOSIS — R188 Other ascites: Secondary | ICD-10-CM | POA: Diagnosis not present

## 2022-03-15 DIAGNOSIS — G8929 Other chronic pain: Secondary | ICD-10-CM

## 2022-03-15 DIAGNOSIS — I8510 Secondary esophageal varices without bleeding: Secondary | ICD-10-CM

## 2022-03-15 DIAGNOSIS — R140 Abdominal distension (gaseous): Secondary | ICD-10-CM

## 2022-03-15 DIAGNOSIS — Z86010 Personal history of colonic polyps: Secondary | ICD-10-CM

## 2022-03-15 DIAGNOSIS — K219 Gastro-esophageal reflux disease without esophagitis: Secondary | ICD-10-CM

## 2022-03-15 DIAGNOSIS — I864 Gastric varices: Secondary | ICD-10-CM

## 2022-03-15 MED ORDER — FUROSEMIDE 40 MG PO TABS: 80.0000 mg | ORAL_TABLET | Freq: Every day | ORAL | 7 refills | Status: DC

## 2022-03-15 MED ORDER — SPIRONOLACTONE 100 MG PO TABS: 200.0000 mg | ORAL_TABLET | Freq: Every day | ORAL | 5 refills | Status: DC

## 2022-03-15 NOTE — Patient Instructions (Addendum)
We will continue with lasix 63m and spironolactone 2078mPlease continue protonix 402maily You are due for labs ( CBC, CMP, INR and AFP) you can have these done when you see your PCP and have them fax those over to us [place] we can keep these updated.  Please let me know if weight loss continues.  Again, I urge you to consider TIPS procedure as you are at high risk for variceal bleeding which could be life threatening, if you have any rectal bleeding, black stools, or vomiting of blood or coffee ground emesis, please proceed to the ER immediately   Follow up 3 months

## 2022-03-15 NOTE — Progress Notes (Signed)
Referring Provider: Celene Squibb, MD Primary Care Physician:  Celene Squibb, MD Primary GI Physician: Jenetta Downer   Chief Complaint  Patient presents with   Cirrhosis    1 month follow up on cirrhosis. Patient has concerns about weight loss.    HPI:   PRAJWAL FELLNER is a 66 y.o. male with past medical history of  NASH cirrhosis complicated by grade 3 esophgeal varices, Type II GOV varices, GERD complicated by Barrett's esophagus without dysplasia, hx of intraoperative cardiac arrest.    Patient presenting today for follow up.  History: seen 01/23/22, Having mid to upper abdominal pain x3-4 weeks, bloating and weight gain. Appetite is good, he denied early satiety or inability to finish his meals. Having 4-5 formed BMs per day. maintained on pantoprazole for the past few years.  Advised to schedule EGD, Rx carafate 1g QID, continue PPI, obtain labs from PCP, start low dose lasix/spironolactone if electrolytes WNL, BMP 1-2 weeks after diuretic initiaion.   patient started on furosemide 40 mg every day and spironolactone 100 mg daily On 8/1 Liver doppler done 02/07/22 with Patent portal vein without evidence of thrombus or abnormal portal flow direction. No significant reduction in portal vein velocities to suggest significant portal hypertension. Ascites in the right upper quadrant. Massive splenomegaly   Repeat BMP with sodium 141, potassium 4.2 on 02/05/22, MELD 11  EGD scheduled on 8/24 was never completed   Last seen 02/08/22, at that time felt that swelling to abdomen was not much better than previously, becoming larger after eating, sometimes feeling tighter. Appetite not great and down 7 pounds since July.   Recommended to reconsider TIPS procedure, increased lasix to 68m daily and spironolactone to 2042mdaily.  Present: Patient is down 14 lbs today since august 10. Patient states that he is diong much better today. Feels that fluid pills are working well, does not notice anymore  swelling to his abdomen. Appetite is great. He denies any abdominal pain nausea or vomiting. Denies any itching, changes in skin color or confusion. He does report he is working out at [employer] days per week.No red flag symptoms. Patient denies melena, hematochezia, nausea, vomiting, diarrhea, constipation, dysphagia, odyonophagia, early satiety or weight loss.   Patient having labs done on 03/26/22 with his PCP.   Cirrhosis related questions: Hematemesis/coffee ground emesis: no History of variceal bleeding: yes Abdominal pain: no Abdominal distention/worsening ascites: no Fever/chills: no Episodes of confusion/disorientation: no Taking diuretics?:lasix 8050mpironolactone 200m70mily Prior history of banding?: no Beta blockers? No, pt did not tolerate Prior episodes of SBP: no  Last Liver imaging: [place] doppler 02/07/22 no veno-occlusive disease, no hepatic masses or biliary dilation   Last Colonoscopy:2021- Two small polyps in the sigmoid colon and in the ascending colon, removed with a cold snare. Resected and retrieved. - External hemorrhoids. - Biopsies were taken with a cold forceps from the ascending colon and sigmoid colon for evaluation of microscopic colitis-Negative  Last Endoscopy: 11/04/20 grade III esophageal varices, Type 2 gastroesophageal varices (GOV 2 esophageal varices which extend along the fundus) w/o bleeding, single gastric polyp   Repeat Colon in 2026  Past Medical History:  Diagnosis Date   Acute systolic (congestive) heart failure (HCC)[place]/30/2019   Arthritis    knees, wrists   Barrett's esophagus    Cardiac arrest (HCC)[place]/03/0092omplication of anesthesia    02-22-2016 intraop cardiac arrest immediately after vancomyocin administration , pt cardiac arrest w/ Vfib,  please  refer to anesthesia record in epic   Dysrhythmia    Afib 12/2018 - converted to NSR   Esophageal varices determined by endoscopy ([place]) 11/2016   grade 1   GERD (gastroesophageal  reflux disease)    Hiatal hernia    History of adenomatous polyp of colon    History of asthma    as child   History of DVT (deep vein thrombosis)    right lower extremitty post-op right total knee surgery 09/ 2010,  treated w/ coumadin   History of esophageal dilatation 07/1998   for schatzski ring   History of kidney stones    History of staph infection 04/2016   MSSA infection of right total knee w/ sepsis   Hx of cardiac arrest    02-22-2016  intraoperative, immediately following moving pt into prone position and vancomyocin administration, cardiac arrest w/ Vfib (referred to anesthesia record in epic) pt extubated himself next day   Infection of prosthetic right knee joint ([place])    Liver cirrhosis secondary to NASH (nonalcoholic steatohepatitis) ([place])    NAFLD-- followed by dr Laural Golden---  liver bx 09-30-2012  mild portal and focal sinusoidal fibrosis   Pre-diabetes    Pyogenic arthritis of right knee joint ([place]) 04/02/2016   Scapholunate advanced collapse of left wrist    deformity   Shock circulatory ([place])    Staphylococcus aureus bacteremia with sepsis ([place])    Thrombocytopenia ([place])    10-09-2017 per pt his platelets have always been low , followed by pcp, never been referred to hematologist    Past Surgical History:  Procedure Laterality Date   APPLICATION OF WOUND VAC Right 02/22/2016   Procedure: APPLICATION OF WOUND VAC;  Surgeon: Vickey Huger, MD;  Location: [place];  Service: Orthopedics;  Laterality: Right;   BIOPSY  02/10/2020   Procedure: BIOPSY;  Surgeon: Rogene Houston, MD;  [facility];  Service: Endoscopy;;   CARDIAC CATHETERIZATION N/A 02/24/2016   Procedure: Left Heart Cath and Coronary Angiography;  Surgeon: Burnell Blanks, MD;  Location: [facility];  Service: Cardiovascular;  Laterality: N/A;  No angiographic evidence of CAD,  normal LVSF, ef 50-55%   CARPECTOMY Left 10/17/2017   Procedure: LEFT WRIST PROXIMAL ROW CARPECTOMY WITH  POSTEROR IMBROSSEOUS NERVE EXCISION;  Surgeon: Charlotte Crumb, MD;  Location: [place];  Service: Orthopedics;  Laterality: Left;  AXILLARY BLOCK   CARPECTOMY WITH RADIAL STYLOIDECTOMY Left 02/11/2019   Procedure: LEFT WRIST RADIAL STYLOIDECTOMY;  Surgeon: Charlotte Crumb, MD;  Location: [place];  Service: Orthopedics;  Laterality: Left;   COLONOSCOPY     COLONOSCOPY WITH PROPOFOL N/A 02/10/2020   Procedure: COLONOSCOPY WITH PROPOFOL;  Surgeon: Rogene Houston, MD;  [facility];  Service: Endoscopy;  Laterality: N/A;  955   EAR CYST EXCISION Right 09/06/2014   Procedure: OPEN EXCISION BAKER'S CYST RIGHT KNEE;  Surgeon: Vickey Huger, MD;  Location: [place];  Service: Orthopedics;  Laterality: Right;   ESOPHAGEAL BANDING  06/30/2020   Procedure: ESOPHAGEAL BANDING;  Surgeon: Rogene Houston, MD;  [facility];  Service: Endoscopy;;   ESOPHAGEAL DILATION  1996/ 2000   ESOPHAGOGASTRODUODENOSCOPY N/A 09/17/2012   Procedure: ESOPHAGOGASTRODUODENOSCOPY (EGD);  Surgeon: Rogene Houston, MD;  [facility];  Service: Endoscopy;  Laterality: N/A;  200   ESOPHAGOGASTRODUODENOSCOPY N/A 12/15/2015   Procedure: ESOPHAGOGASTRODUODENOSCOPY (EGD);  Surgeon: Rogene Houston, MD;  [facility];  Service: Endoscopy;  Laterality: N/A;  1245  ESOPHAGOGASTRODUODENOSCOPY N/A 12/17/2016   Procedure: ESOPHAGOGASTRODUODENOSCOPY (EGD);  Surgeon: Rogene Houston, MD;  [facility];  Service: Endoscopy;  Laterality: N/A;  210   ESOPHAGOGASTRODUODENOSCOPY N/A 06/30/2020   Procedure: ESOPHAGOGASTRODUODENOSCOPY (EGD);  Surgeon: Rogene Houston, MD;  [facility];  Service: Endoscopy;  Laterality: N/A;  11:15   ESOPHAGOGASTRODUODENOSCOPY (EGD) WITH PROPOFOL N/A 02/10/2020   Procedure: ESOPHAGOGASTRODUODENOSCOPY (EGD) WITH PROPOFOL;  Surgeon: Rogene Houston, MD;  [facility];  Service: Endoscopy;  Laterality: N/A;    ESOPHAGOGASTRODUODENOSCOPY (EGD) WITH PROPOFOL N/A 11/04/2020   Procedure: ESOPHAGOGASTRODUODENOSCOPY (EGD) WITH PROPOFOL;  Surgeon: Harvel Quale, MD;  [facility];  Service: Gastroenterology;  Laterality: N/A;  9:45   I & D KNEE WITH POLY EXCHANGE Right 04/02/2016   Procedure: IRRIGATION AND DEBRIDEMENT RIGHT KNEE WITH POLY EXCHANGE;  Surgeon: Vickey Huger, MD;  Location: [place];  Service: Orthopedics;  Laterality: Right;   INCISION AND DRAINAGE HEMATOMA POST LEFT  TOTAL KNEE  2012   INGUINAL HERNIA REPAIR Bilateral 05/22/2013   Procedure: REPAIR OF RECURRENT INCARCERATED INGUINAL HERNIA WITH MESH RIGHT SIDE,  REPAIR OF RECURRENT INGUINAL HERNIA WITH MESH LEFT SIDE;  Surgeon: [name], MD;  Location: [facility];  Service: General;  Laterality: Bilateral;   INGUINAL HERNIA REPAIR Bilateral 11/1997   INSERTION OF MESH Bilateral 05/22/2013   Procedure: INSERTION OF MESH;  Surgeon: [name], MD;  Location: [place];  Service: General;  Laterality: Bilateral;   IRRIGATION AND DEBRIDEMENT KNEE Right 02/22/2016   Procedure: IRRIGATION AND DEBRIDEMENT KNEE;  Surgeon: Vickey Huger, MD;  Location: [place];  Service: Orthopedics;  Laterality: Right;   Irrigation and Debridement right knee  03/23/2009   Dr. Sabra Heck, [place] ARTHROSCOPY W/ SYNOVECTOMY Right 01/30/2010   AND DEBRIDEMENT OF HETEROTOPIC   LIVER BIOPSY  09/30/2012   REVISION TOTAL KNEE ARTHROPLASTY Left fall 2012   TONSILLECTOMY  child   TOTAL KNEE ARTHROPLASTY Bilateral right 09/ 2010/  left 09-18-2010   TRANSTHORACIC ECHOCARDIOGRAM  02/22/2016   ef 30-35% (per cardiac cath 02-24-2016 normal), severe hypokinesis of the mid-apicalanteroseptal and apical myocardium,  grade 1 diastolic dysfunction/ trivial PR and TR   TRIGGER FINGER RELEASE Left 02/11/2019   Procedure: LEFT WRIST STENOSING TENOSYNOVITIS RELEASE;  Surgeon: Charlotte Crumb, MD;  Location: [place];  Service: Orthopedics;  Laterality: Left;  MAC WITH  AXILLARY BLOCK   WRIST ARTHROPLASTY Left 10/17/2017   Procedure: CAPITATE RESURFACING ARTHROPLASTY;  Surgeon: Charlotte Crumb, MD;  Location: [facility];  Service: Orthopedics;  Laterality: Left;    Current Outpatient Medications  Medication Sig Dispense Refill   EPINEPHrine (PRIMATENE MIST) 0.125 MG/ACT AERO Inhale 1 puff into the lungs daily as needed (asthma).     furosemide (LASIX) 40 MG tablet Take 1 tablet (40 mg total) by mouth daily. 90 tablet 1   glipiZIDE (GLUCOTROL XL) 2.5 MG 24 hr tablet Take 1 tablet by mouth once daily with breakfast 90 tablet 4   methimazole (TAPAZOLE) 10 MG tablet Take 15 mg by mouth daily.     pantoprazole (PROTONIX) 40 MG tablet Take 1 tablet (40 mg total) by mouth daily before breakfast. 90 tablet 3   spironolactone (ALDACTONE) 100 MG tablet Take 100 mg by mouth daily.     sildenafil (VIAGRA) 100 MG tablet Take 100 mg by mouth daily as needed for erectile dysfunction.     sucralfate (CARAFATE) 1 GM/10ML suspension Take 10 mLs (1 g total) by mouth  4 (four) times daily -  with meals and at bedtime. (Patient not taking: Reported on 02/13/2022) 420 mL 1   No current facility-administered medications for this visit.    Allergies as of 03/15/2022 - Review Complete 03/15/2022  Allergen Reaction Noted   Asa [aspirin] Shortness Of Breath 04/01/2016   Penicillins Shortness Of Breath 07/04/2012   Vancomycin Anaphylaxis 02/22/2016    Family History  Problem Relation Age of Onset   Arthritis Father        died age 39   GI Bleed Father        upper GI Bleed, non-ETOH cirrhosis   Arthritis Brother    Diabetes Brother        type 2   Diabetes Paternal Uncle        type 2   Heart attack Maternal Grandmother    Heart attack Maternal Grandfather    Colon cancer Neg Hx    Colon polyps Neg Hx     Social History   Socioeconomic History   Marital status: Legally Separated    Spouse name: Not on file   Number of children: 2   Years of  education: Not on file   Highest education level: 12th grade  Occupational History    Employer: [employer]    Comment: part time  Tobacco Use   Smoking status: Former    Packs/day: 0.25    Years: 3.00    Total pack years: 0.75    Types: Cigarettes    Quit date: 10/10/1974    Years since quitting: 47.4    Passive exposure: Past   Smokeless tobacco: Never  Vaping Use   Vaping Use: Never used  Substance and Sexual Activity   Alcohol use: Not Currently   Drug use: No   Sexual activity: Yes  Other Topics Concern   Not on file  Social History Narrative   ** Merged History Encounter **       Pt lives alone.   Social Determinants of Health   Financial Resource Strain: Low Risk  (11/16/2019)   Overall Financial Resource Strain (CARDIA)    Difficulty of Paying Living Expenses: Not hard at all  Food Insecurity: No Food Insecurity (02/28/2021)   Hunger Vital Sign    Worried About Running Out of Food in the Last Year: Never true    Ran Out of Food in the Last Year: Never true  Transportation Needs: No Transportation Needs (02/28/2021)   PRAPARE - Hydrologist (Medical): No    Lack of Transportation (Non-Medical): No  Physical Activity: Inactive (11/16/2019)   Exercise Vital Sign    Days of Exercise per Week: 0 days    Minutes of Exercise per Session: 0 min  Stress: No Stress Concern Present (11/16/2019)   [place]    Feeling of Stress : Not at all  Social Connections: Moderately Isolated (11/16/2019)   Social Connection and Isolation Panel [NHANES]    Frequency of Communication with Friends and Family: More than three times a week    Frequency of Social Gatherings with Friends and Family: More than three times a week    Attends Religious Services: More than 4 times per year    Active Member of [employer] or Organizations: No    Attends Archivist Meetings: Never    Marital Status:  Separated   Review of systems General: negative for malaise, night sweats, fever, chills, +weight loss Neck:  Negative for lumps, goiter, pain and significant neck swelling Resp: Negative for cough, wheezing, dyspnea at rest CV: Negative for chest pain, leg swelling, palpitations, orthopnea GI: denies melena, hematochezia, nausea, vomiting, diarrhea, constipation, dysphagia, odyonophagia, early satiety or unintentional weight loss.  MSK: Negative for joint pain or swelling, back pain, and muscle pain. Derm: Negative for itching or rash Psych: Denies depression, anxiety, memory loss, confusion. No homicidal or suicidal ideation.  Heme: Negative for prolonged bleeding, bruising easily, and swollen nodes. Endocrine: Negative for cold or heat intolerance, polyuria, polydipsia and goiter. Neuro: negative for tremor, gait imbalance, syncope and seizures. The remainder of the review of systems is noncontributory.  Physical Exam: BP 126/72 (BP Location: Left Arm, Patient Position: Sitting, Cuff Size: Normal)   Pulse 79   Temp 98.5 F (36.9 C) (Oral)   Ht 6' (1.829 m)   Wt 180 lb 6.4 oz (81.8 kg)   BMI 24.47 kg/m  General:   Alert and oriented. No distress noted. Pleasant and cooperative.  Head:  Normocephalic and atraumatic. Eyes:  Conjuctiva clear without scleral icterus. Mouth:  Oral mucosa pink and moist. Good dentition. No lesions. Heart: Normal rate and rhythm, s1 and s2 heart sounds present.  Lungs: Clear lung sounds in all lobes. Respirations equal and unlabored. Abdomen:  +BS, soft, non-tender and non-distended. No rebound or guarding. No HSM or masses noted. Derm: No palmar erythema or jaundice Msk:  Symmetrical without gross deformities. Normal posture. Extremities:  Without edema. Neurologic:  Alert and  oriented x4 Psych:  Alert and cooperative. Normal mood and affect.  Invalid input(s): "6 MONTHS"   ASSESSMENT: ZAMIRE WHITEHURST is a 66 y.o. male presenting today for follow  up of ascites.   Patient doing much better with increase of lasix to 12m and spironolactone to 2020mdaily. Has not ascites at this time. Denies confusion, jaundice or pruritus. Feels that appetite is great and he is eating well. Denies abdominal pain, nausea or vomiting. Notably has lost 14 pounds but suspect this is a combination from increased diuretics and patient now exercising regularly. We will continue to keep an eye on his weight, if he continues to lose, further investigation will be warranted. Last MELD 3.0 was 11.   I did discuss with the patient again potential TIPS procedure as he did not tolerate Beta blockers in the past and is at risk for variceal bleeding which could be life threatening. Patient still adamant that he does not wish to proceed with TIPS. He is aware that hematemesis, rectal bleeding or coffee ground emesis requires him proceed to the ER immediately.    PLAN:  -MELD labs and INR due in Oct, will have done at PCP  -Repeat liver imaging Feb 2023 - Reduce salt intake to <2 g per day - Can take Tylenol max of 2 g per day (650 mg q8h) for pain - Avoid NSAIDs for pain - Avoid eating raw oysters/shellfish - Ensure every night before going to sleep -ED precautions for rectal bleeding, hematemesis, coffee ground emesis -TIPS consideration** -continue lasix 8053mnd spironolactone 100m78mily  All questions were answered, patient verbalized understanding and is in agreement with plan as outlined above.   Follow Up: 3 months   Jermario Kalmar L. CarlAlver SorrowN, APRN, AGNP-C Adult-Gerontology Nurse Practitioner [facility]

## 2022-03-21 DIAGNOSIS — E538 Deficiency of other specified B group vitamins: Secondary | ICD-10-CM | POA: Diagnosis not present

## 2022-03-21 DIAGNOSIS — E0590 Thyrotoxicosis, unspecified without thyrotoxic crisis or storm: Secondary | ICD-10-CM | POA: Diagnosis not present

## 2022-03-21 DIAGNOSIS — E1165 Type 2 diabetes mellitus with hyperglycemia: Secondary | ICD-10-CM | POA: Diagnosis not present

## 2022-03-26 DIAGNOSIS — K7460 Unspecified cirrhosis of liver: Secondary | ICD-10-CM | POA: Diagnosis not present

## 2022-03-26 DIAGNOSIS — R1013 Epigastric pain: Secondary | ICD-10-CM | POA: Diagnosis not present

## 2022-03-26 DIAGNOSIS — E1165 Type 2 diabetes mellitus with hyperglycemia: Secondary | ICD-10-CM | POA: Diagnosis not present

## 2022-03-26 DIAGNOSIS — Z125 Encounter for screening for malignant neoplasm of prostate: Secondary | ICD-10-CM | POA: Diagnosis not present

## 2022-03-26 DIAGNOSIS — G8929 Other chronic pain: Secondary | ICD-10-CM | POA: Diagnosis not present

## 2022-03-26 DIAGNOSIS — K7581 Nonalcoholic steatohepatitis (NASH): Secondary | ICD-10-CM | POA: Diagnosis not present

## 2022-03-26 DIAGNOSIS — E0590 Thyrotoxicosis, unspecified without thyrotoxic crisis or storm: Secondary | ICD-10-CM | POA: Diagnosis not present

## 2022-03-26 DIAGNOSIS — R140 Abdominal distension (gaseous): Secondary | ICD-10-CM | POA: Diagnosis not present

## 2022-03-29 DIAGNOSIS — D696 Thrombocytopenia, unspecified: Secondary | ICD-10-CM | POA: Diagnosis not present

## 2022-03-29 DIAGNOSIS — K219 Gastro-esophageal reflux disease without esophagitis: Secondary | ICD-10-CM | POA: Diagnosis not present

## 2022-03-29 DIAGNOSIS — I8500 Esophageal varices without bleeding: Secondary | ICD-10-CM | POA: Diagnosis not present

## 2022-03-29 DIAGNOSIS — F5221 Male erectile disorder: Secondary | ICD-10-CM | POA: Diagnosis not present

## 2022-03-29 DIAGNOSIS — D61818 Other pancytopenia: Secondary | ICD-10-CM | POA: Diagnosis not present

## 2022-03-29 DIAGNOSIS — E1165 Type 2 diabetes mellitus with hyperglycemia: Secondary | ICD-10-CM | POA: Diagnosis not present

## 2022-03-29 DIAGNOSIS — K7460 Unspecified cirrhosis of liver: Secondary | ICD-10-CM | POA: Diagnosis not present

## 2022-03-29 DIAGNOSIS — Z125 Encounter for screening for malignant neoplasm of prostate: Secondary | ICD-10-CM | POA: Diagnosis not present

## 2022-03-29 DIAGNOSIS — N3281 Overactive bladder: Secondary | ICD-10-CM | POA: Diagnosis not present

## 2022-03-29 DIAGNOSIS — E0590 Thyrotoxicosis, unspecified without thyrotoxic crisis or storm: Secondary | ICD-10-CM | POA: Diagnosis not present

## 2022-04-30 DIAGNOSIS — L80 Vitiligo: Secondary | ICD-10-CM | POA: Diagnosis not present

## 2022-04-30 DIAGNOSIS — L570 Actinic keratosis: Secondary | ICD-10-CM | POA: Diagnosis not present

## 2022-04-30 DIAGNOSIS — Z1283 Encounter for screening for malignant neoplasm of skin: Secondary | ICD-10-CM | POA: Diagnosis not present

## 2022-04-30 DIAGNOSIS — D239 Other benign neoplasm of skin, unspecified: Secondary | ICD-10-CM | POA: Diagnosis not present

## 2022-04-30 DIAGNOSIS — L2082 Flexural eczema: Secondary | ICD-10-CM | POA: Diagnosis not present

## 2022-05-13 ENCOUNTER — Other Ambulatory Visit: Payer: Self-pay | Admitting: Family Medicine

## 2022-05-13 ENCOUNTER — Encounter: Payer: Self-pay | Admitting: Family Medicine

## 2022-05-13 DIAGNOSIS — R918 Other nonspecific abnormal finding of lung field: Secondary | ICD-10-CM

## 2022-05-21 DIAGNOSIS — E119 Type 2 diabetes mellitus without complications: Secondary | ICD-10-CM | POA: Diagnosis not present

## 2022-05-21 DIAGNOSIS — K219 Gastro-esophageal reflux disease without esophagitis: Secondary | ICD-10-CM | POA: Diagnosis not present

## 2022-05-31 ENCOUNTER — Ambulatory Visit (INDEPENDENT_AMBULATORY_CARE_PROVIDER_SITE_OTHER): Payer: PPO | Admitting: Gastroenterology

## 2022-06-04 ENCOUNTER — Telehealth: Payer: Self-pay | Admitting: Family Medicine

## 2022-06-04 NOTE — Telephone Encounter (Signed)
Please advise patient he is due to have chest CT to follow up on lung nodules seen last year. Order was placed last month, but it looks like sarah  was not  able to get in touch with him. Please have him call sarah or [facility] to schedule.

## 2022-06-04 NOTE — Telephone Encounter (Signed)
Patient states he is no longer a patient at this practice. He is seeing a provider in [place].

## 2022-06-15 ENCOUNTER — Other Ambulatory Visit: Payer: Self-pay | Admitting: Internal Medicine

## 2022-06-18 ENCOUNTER — Telehealth (INDEPENDENT_AMBULATORY_CARE_PROVIDER_SITE_OTHER): Payer: PPO | Admitting: Gastroenterology

## 2022-07-04 DIAGNOSIS — K7460 Unspecified cirrhosis of liver: Secondary | ICD-10-CM | POA: Diagnosis not present

## 2022-07-04 DIAGNOSIS — E1165 Type 2 diabetes mellitus with hyperglycemia: Secondary | ICD-10-CM | POA: Diagnosis not present

## 2022-07-04 DIAGNOSIS — Z125 Encounter for screening for malignant neoplasm of prostate: Secondary | ICD-10-CM | POA: Diagnosis not present

## 2022-07-04 DIAGNOSIS — E0590 Thyrotoxicosis, unspecified without thyrotoxic crisis or storm: Secondary | ICD-10-CM | POA: Diagnosis not present

## 2022-07-10 DIAGNOSIS — Z125 Encounter for screening for malignant neoplasm of prostate: Secondary | ICD-10-CM | POA: Diagnosis not present

## 2022-07-10 DIAGNOSIS — F5221 Male erectile disorder: Secondary | ICD-10-CM | POA: Diagnosis not present

## 2022-07-10 DIAGNOSIS — D61818 Other pancytopenia: Secondary | ICD-10-CM | POA: Diagnosis not present

## 2022-07-10 DIAGNOSIS — I8500 Esophageal varices without bleeding: Secondary | ICD-10-CM | POA: Diagnosis not present

## 2022-07-10 DIAGNOSIS — K219 Gastro-esophageal reflux disease without esophagitis: Secondary | ICD-10-CM | POA: Diagnosis not present

## 2022-07-10 DIAGNOSIS — N3281 Overactive bladder: Secondary | ICD-10-CM | POA: Diagnosis not present

## 2022-07-10 DIAGNOSIS — K7460 Unspecified cirrhosis of liver: Secondary | ICD-10-CM | POA: Diagnosis not present

## 2022-07-10 DIAGNOSIS — D696 Thrombocytopenia, unspecified: Secondary | ICD-10-CM | POA: Diagnosis not present

## 2022-07-10 DIAGNOSIS — Z0001 Encounter for general adult medical examination with abnormal findings: Secondary | ICD-10-CM | POA: Diagnosis not present

## 2022-07-10 DIAGNOSIS — E1165 Type 2 diabetes mellitus with hyperglycemia: Secondary | ICD-10-CM | POA: Diagnosis not present

## 2022-07-10 DIAGNOSIS — E0590 Thyrotoxicosis, unspecified without thyrotoxic crisis or storm: Secondary | ICD-10-CM | POA: Diagnosis not present

## 2022-08-27 ENCOUNTER — Encounter (INDEPENDENT_AMBULATORY_CARE_PROVIDER_SITE_OTHER): Payer: Self-pay | Admitting: Gastroenterology

## 2022-08-27 ENCOUNTER — Ambulatory Visit (INDEPENDENT_AMBULATORY_CARE_PROVIDER_SITE_OTHER): Payer: PPO | Admitting: Gastroenterology

## 2022-08-27 VITALS — BP 113/70 | HR 73 | Temp 98.9°F | Ht 72.0 in | Wt 184.5 lb

## 2022-08-27 DIAGNOSIS — K7460 Unspecified cirrhosis of liver: Secondary | ICD-10-CM

## 2022-08-27 DIAGNOSIS — K219 Gastro-esophageal reflux disease without esophagitis: Secondary | ICD-10-CM

## 2022-08-27 DIAGNOSIS — K7581 Nonalcoholic steatohepatitis (NASH): Secondary | ICD-10-CM

## 2022-08-27 DIAGNOSIS — I8500 Esophageal varices without bleeding: Secondary | ICD-10-CM | POA: Diagnosis not present

## 2022-08-27 DIAGNOSIS — I864 Gastric varices: Secondary | ICD-10-CM

## 2022-08-27 MED ORDER — PANTOPRAZOLE SODIUM 40 MG PO TBEC: 40.0000 mg | DELAYED_RELEASE_TABLET | Freq: Every day | ORAL | 3 refills | Status: DC

## 2022-08-27 NOTE — Patient Instructions (Addendum)
Please continue protonix '40mg'$  once daily, refill sent Continue lasix '80mg'$  daily and spironolactone '200mg'$  daily We will update other liver labs that PCP did not do as well as RUQ [place] Given you are not interested in TIPS, we can refer you to [facility] for potential coiling of varices to hopefully prevent any bleeding. Please let me know if you are interested in this. Any episodes of rectal bleed, black stools, coffee ground emesis or bloody emesis could indicate bleeding from these varices which is a medical emergency and you need to proceed to the ED right away  - Reduce salt intake to <2 g per day - Can take Tylenol max of 2 g per day (650 mg q8h) for pain - Avoid NSAIDs for pain - Avoid eating raw oysters/shellfish - Ensure protein shake every night before going to sleep

## 2022-08-27 NOTE — Progress Notes (Addendum)
Referring Provider: Celene Squibb, MD Primary Care Physician:  Stephen Squibb, MD Primary GI Physician: Stephen Burch   Chief Complaint  Patient presents with   Follow-up    Pt arrives for follow up. No issues/concerns at this time   HPI:   Stephen Burch is a 67 y.o. male with past medical history of NASH cirrhosis complicated by grade 3 esophgeal varices, Type II GOV varices, GERD complicated by Barrett's esophagus without dysplasia, hx of intraoperative cardiac arrest.    Patient presenting today for follow up of cirrhosis and GERD   History: patient started on furosemide 40 mg every day and spironolactone 100 mg daily On 8/1 Liver doppler done 02/07/22 with Patent portal vein without evidence of thrombus or abnormal portal flow direction. No significant reduction in portal vein velocities to suggest significant portal hypertension. Ascites in the right upper quadrant. Massive splenomegaly   EGD scheduled on 8/24 was never completed   Did not tolerate BBs in the past for his esophageal varices, multiple recommendations to consider TIPS procedure which patient has declined. Discussed in depth at multiple OVs that he is high risk for bleeding of his EVs, which is life threatening.   seen 02/08/22  increased lasix to '80mg'$  daily and spironolactone to '200mg'$  daily.   At last visit in September 2023,  down 14 lbs today since august 10. doing much better today. Feels that fluid pills are working well, does not notice anymore swelling to his abdomen. Appetite is great.   Recommended to have repeat MELD labs, INR in October, RUQ [place] Feb 2024, again recommended TIPS procedure consideration, continue lasix '80mg'$  spironolactone '100mg'$  daily.  Last labs with plt count 85k, t bili 2.2, alk phos 166, ast 32, alt 31, sodium 138, has not had INR/AFP since April 2023.  Present: Patient reports he is doing well. Feels that diuretics control his ascites well. No swelling in his legs or feet. He did forget to  take is diuretics the other day and had some swelling but otherwise doing well. No episodes of confusion.  No jaundice or pruritus.  Appetite is good. He is working out at [employer] 3x/week. No red flag symptoms. Patient denies melena, hematochezia, nausea, vomiting, diarrhea, constipation, dysphagia, odyonophagia, early satiety or weight loss.   GERD is well controlled on pantoprazole '40mg'$  once daily.   LAST MELD (INR, AFP April 2023)   Cirrhosis related questions: Hematemesis/coffee ground emesis: no History of variceal bleeding: no Abdominal distention/worsening ascites: no Episodes of confusion/disorientation: no  Taking diuretics?:lasix '80mg'$ /spironolactone '100mg'$  daily  Prior history of banding?: no  Prior episodes of SBP: no  Beta blockers: none, did not tolerate in the past   Last Colonoscopy:2021- Two small polyps in the sigmoid colon and in the ascending colon, removed with a cold snare. Resected and retrieved. - External hemorrhoids. - Biopsies were taken with a cold forceps from the ascending colon and sigmoid colon for evaluation of microscopic colitis-Negative  Last Endoscopy: 11/04/20 grade III esophageal varices, Type 2 gastroesophageal varices (GOV 2 esophageal varices which extend along the fundus) w/o bleeding, single gastric polyp   Repeat Colonoscopy in 2026   Past Medical History:  Diagnosis Date   Acute systolic (congestive) heart failure ([place]) 04/30/2018   Arthritis    knees, wrists   Barrett's esophagus    Cardiac arrest ([place]) [id number]   Complication of anesthesia    02-22-2016 intraop cardiac arrest immediately after vancomyocin administration , pt cardiac arrest w/ Vfib,  please refer  to anesthesia record in epic   Dysrhythmia    Afib 12/2018 - converted to NSR   Esophageal varices determined by endoscopy ([place]) 11/2016   grade 1   GERD (gastroesophageal reflux disease)    Hiatal hernia    History of adenomatous polyp of colon    History of asthma    as  child   History of DVT (deep vein thrombosis)    right lower extremitty post-op right total knee surgery 09/ 2010,  treated w/ coumadin   History of esophageal dilatation 07/1998   for schatzski ring   History of kidney stones    History of staph infection 04/2016   MSSA infection of right total knee w/ sepsis   Hx of cardiac arrest    02-22-2016  intraoperative, immediately following moving pt into prone position and vancomyocin administration, cardiac arrest w/ Vfib (referred to anesthesia record in epic) pt extubated himself next day   Infection of prosthetic right knee joint ([place])    Liver cirrhosis secondary to NASH (nonalcoholic steatohepatitis) ([place])    NAFLD-- followed by dr Laural Golden---  liver bx 09-30-2012  mild portal and focal sinusoidal fibrosis   Pre-diabetes    Pyogenic arthritis of right knee joint ([place]) 04/02/2016   Scapholunate advanced collapse of left wrist    deformity   Shock circulatory ([place])    Staphylococcus aureus bacteremia with sepsis ([place])    Thrombocytopenia ([place])    10-09-2017 per pt his platelets have always been low , followed by pcp, never been referred to hematologist    Past Surgical History:  Procedure Laterality Date   APPLICATION OF WOUND VAC Right 02/22/2016   Procedure: APPLICATION OF WOUND VAC;  Surgeon: Vickey Huger, MD;  Location: [place];  Service: Orthopedics;  Laterality: Right;   BIOPSY  02/10/2020   Procedure: BIOPSY;  Surgeon: Rogene Houston, MD;  [facility];  Service: Endoscopy;;   CARDIAC CATHETERIZATION N/A 02/24/2016   Procedure: Left Heart Cath and Coronary Angiography;  Surgeon: Burnell Blanks, MD;  Location: [facility];  Service: Cardiovascular;  Laterality: N/A;  No angiographic evidence of CAD,  normal LVSF, ef 50-55%   CARPECTOMY Left 10/17/2017   Procedure: LEFT WRIST PROXIMAL ROW CARPECTOMY WITH POSTEROR IMBROSSEOUS NERVE EXCISION;  Surgeon: Charlotte Crumb, MD;  Location: [facility];   Service: Orthopedics;  Laterality: Left;  AXILLARY BLOCK   CARPECTOMY WITH RADIAL STYLOIDECTOMY Left 02/11/2019   Procedure: LEFT WRIST RADIAL STYLOIDECTOMY;  Surgeon: Charlotte Crumb, MD;  Location: [facility];  Service: Orthopedics;  Laterality: Left;   COLONOSCOPY     COLONOSCOPY WITH PROPOFOL N/A 02/10/2020   Procedure: COLONOSCOPY WITH PROPOFOL;  Surgeon: Rogene Houston, MD;  [facility];  Service: Endoscopy;  Laterality: N/A;  955   EAR CYST EXCISION Right 09/06/2014   Procedure: OPEN EXCISION BAKER'S CYST RIGHT KNEE;  Surgeon: Vickey Huger, MD;  Location: [place];  Service: Orthopedics;  Laterality: Right;   ESOPHAGEAL BANDING  06/30/2020   Procedure: ESOPHAGEAL BANDING;  Surgeon: Rogene Houston, MD;  [facility];  Service: Endoscopy;;   ESOPHAGEAL DILATION  1996/ 2000   ESOPHAGOGASTRODUODENOSCOPY N/A 09/17/2012   Procedure: ESOPHAGOGASTRODUODENOSCOPY (EGD);  Surgeon: Rogene Houston, MD;  [facility];  Service: Endoscopy;  Laterality: N/A;  200   ESOPHAGOGASTRODUODENOSCOPY N/A 12/15/2015   Procedure: ESOPHAGOGASTRODUODENOSCOPY (EGD);  Surgeon: Rogene Houston, MD;  [facility];  Service: Endoscopy;  Laterality: N/A;  1245  ESOPHAGOGASTRODUODENOSCOPY N/A 12/17/2016   Procedure: ESOPHAGOGASTRODUODENOSCOPY (EGD);  Surgeon: Rogene Houston, MD;  [facility];  Service: Endoscopy;  Laterality: N/A;  210   ESOPHAGOGASTRODUODENOSCOPY N/A 06/30/2020   Procedure: ESOPHAGOGASTRODUODENOSCOPY (EGD);  Surgeon: Rogene Houston, MD;  [facility];  Service: Endoscopy;  Laterality: N/A;  11:15   ESOPHAGOGASTRODUODENOSCOPY (EGD) WITH PROPOFOL N/A 02/10/2020   Procedure: ESOPHAGOGASTRODUODENOSCOPY (EGD) WITH PROPOFOL;  Surgeon: Rogene Houston, MD;  [facility];  Service: Endoscopy;  Laterality: N/A;   ESOPHAGOGASTRODUODENOSCOPY (EGD) WITH PROPOFOL N/A 11/04/2020   Procedure: ESOPHAGOGASTRODUODENOSCOPY (EGD) WITH PROPOFOL;   Surgeon: Harvel Quale, MD;  [facility];  Service: Gastroenterology;  Laterality: N/A;  9:45   I & D KNEE WITH POLY EXCHANGE Right 04/02/2016   Procedure: IRRIGATION AND DEBRIDEMENT RIGHT KNEE WITH POLY EXCHANGE;  Surgeon: Vickey Huger, MD;  Location: [facility];  Service: Orthopedics;  Laterality: Right;   INCISION AND DRAINAGE HEMATOMA POST LEFT  TOTAL KNEE  2012   INGUINAL HERNIA REPAIR Bilateral 05/22/2013   Procedure: REPAIR OF RECURRENT INCARCERATED INGUINAL HERNIA WITH MESH RIGHT SIDE,  REPAIR OF RECURRENT INGUINAL HERNIA WITH MESH LEFT SIDE;  Surgeon: [name], MD;  Location: [facility];  Service: General;  Laterality: Bilateral;   INGUINAL HERNIA REPAIR Bilateral 11/1997   INSERTION OF MESH Bilateral 05/22/2013   Procedure: INSERTION OF MESH;  Surgeon: [name], MD;  Location: [place];  Service: General;  Laterality: Bilateral;   IRRIGATION AND DEBRIDEMENT KNEE Right 02/22/2016   Procedure: IRRIGATION AND DEBRIDEMENT KNEE;  Surgeon: Vickey Huger, MD;  Location: [facility];  Service: Orthopedics;  Laterality: Right;   Irrigation and Debridement right knee  03/23/2009   Dr. Sabra Heck, [place] ARTHROSCOPY W/ SYNOVECTOMY Right 01/30/2010   AND DEBRIDEMENT OF HETEROTOPIC   LIVER BIOPSY  09/30/2012   REVISION TOTAL KNEE ARTHROPLASTY Left fall 2012   TONSILLECTOMY  child   TOTAL KNEE ARTHROPLASTY Bilateral right 09/ 2010/  left 09-18-2010   TRANSTHORACIC ECHOCARDIOGRAM  02/22/2016   ef 30-35% (per cardiac cath 02-24-2016 normal), severe hypokinesis of the mid-apicalanteroseptal and apical myocardium,  grade 1 diastolic dysfunction/ trivial PR and TR   TRIGGER FINGER RELEASE Left 02/11/2019   Procedure: LEFT WRIST STENOSING TENOSYNOVITIS RELEASE;  Surgeon: Charlotte Crumb, MD;  Location: [facility];  Service: Orthopedics;  Laterality: Left;  MAC WITH AXILLARY BLOCK   WRIST ARTHROPLASTY Left 10/17/2017   Procedure: CAPITATE RESURFACING ARTHROPLASTY;  Surgeon: Charlotte Crumb, MD;  Location: [facility];  Service: Orthopedics;  Laterality: Left;    Current Outpatient Medications  Medication Sig Dispense Refill   cyanocobalamin (VITAMIN B12) 1000 MCG tablet Take 3,000 mcg by mouth daily.     EPINEPHrine (PRIMATENE MIST) 0.125 MG/ACT AERO Inhale 1 puff into the lungs daily as needed (asthma).     furosemide (LASIX) 40 MG tablet Take 2 tablets (80 mg total) by mouth daily. 90 tablet 7   glipiZIDE (GLUCOTROL XL) 2.5 MG 24 hr tablet Take 1 tablet by mouth once daily with breakfast 90 tablet 4   methimazole (TAPAZOLE) 10 MG tablet Take 15 mg by mouth daily.     pantoprazole (PROTONIX) 40 MG tablet TAKE 1 TABLET BY MOUTH ONCE DAILY BEFORE BREAKFAST 90 tablet 0   sildenafil (VIAGRA) 100 MG tablet Take 100 mg by mouth daily as needed for erectile dysfunction.     spironolactone (ALDACTONE) 100 MG tablet Take 2 tablets (200 mg total) by mouth daily.  120 tablet 5   No current facility-administered medications for this visit.    Allergies as of 08/27/2022 - Review Complete 08/27/2022  Allergen Reaction Noted   Asa [aspirin] Shortness Of Breath 04/01/2016   Penicillins Shortness Of Breath 07/04/2012   Vancomycin Anaphylaxis 02/22/2016    Family History  Problem Relation Age of Onset   Arthritis Father        died age 39   GI Bleed Father        upper GI Bleed, non-ETOH cirrhosis   Arthritis Brother    Diabetes Brother        type 2   Diabetes Paternal Uncle        type 2   Heart attack Maternal Grandmother    Heart attack Maternal Grandfather    Colon cancer Neg Hx    Colon polyps Neg Hx     Social History   Socioeconomic History   Marital status: Legally Separated    Spouse name: Not on file   Number of children: 2   Years of education: Not on file   Highest education level: 12th grade  Occupational History    Employer: [employer]    Comment: part time  Tobacco Use   Smoking status: Former    Packs/day: 0.25    Years: 3.00     Total pack years: 0.75    Types: Cigarettes    Quit date: 10/10/1974    Years since quitting: 47.9    Passive exposure: Past   Smokeless tobacco: Never  Vaping Use   Vaping Use: Never used  Substance and Sexual Activity   Alcohol use: Not Currently   Drug use: No   Sexual activity: Yes  Other Topics Concern   Not on file  Social History Narrative   ** Merged History Encounter **       Pt lives alone.   Social Determinants of Health   Financial Resource Strain: Low Risk  (11/16/2019)   Overall Financial Resource Strain (CARDIA)    Difficulty of Paying Living Expenses: Not hard at all  Food Insecurity: No Food Insecurity (02/28/2021)   Hunger Vital Sign    Worried About Running Out of Food in the Last Year: Never true    Ran Out of Food in the Last Year: Never true  Transportation Needs: No Transportation Needs (02/28/2021)   PRAPARE - Hydrologist (Medical): No    Lack of Transportation (Non-Medical): No  Physical Activity: Inactive (11/16/2019)   Exercise Vital Sign    Days of Exercise per Week: 0 days    Minutes of Exercise per Session: 0 min  Stress: No Stress Concern Present (11/16/2019)   [place]    Feeling of Stress : Not at all  Social Connections: Moderately Isolated (11/16/2019)   Social Connection and Isolation Panel [NHANES]    Frequency of Communication with Friends and Family: More than three times a week    Frequency of Social Gatherings with Friends and Family: More than three times a week    Attends Religious Services: More than 4 times per year    Active Member of [employer] or Organizations: No    Attends Music therapist: Never    Marital Status: Separated   Review of systems General: negative for malaise, night sweats, fever, chills, weight loss Neck: Negative for lumps, goiter, pain and significant neck swelling Resp: Negative for cough,  wheezing, dyspnea at rest  CV: Negative for chest pain, leg swelling, palpitations, orthopnea GI: denies melena, hematochezia, nausea, vomiting, diarrhea, constipation, dysphagia, odyonophagia, early satiety or unintentional weight loss.  MSK: Negative for joint pain or swelling, back pain, and muscle pain. Derm: Negative for itching or rash Psych: Denies depression, anxiety, memory loss, confusion. No homicidal or suicidal ideation.  Heme: Negative for prolonged bleeding, bruising easily, and swollen nodes. Endocrine: Negative for cold or heat intolerance, polyuria, polydipsia and goiter. Neuro: negative for tremor, gait imbalance, syncope and seizures. The remainder of the review of systems is noncontributory.  Physical Exam: BP 113/70 (BP Location: Right Arm, Patient Position: Sitting, Cuff Size: Normal)   Pulse 73   Temp 98.9 F (37.2 C)   Ht 6' (1.829 m)   Wt 184 lb 8 oz (83.7 kg)   BMI 25.02 kg/m  General:   Alert and oriented. No distress noted. Pleasant and cooperative.  Head:  Normocephalic and atraumatic. Eyes:  Conjuctiva clear without scleral icterus. Mouth:  Oral mucosa pink and moist. Good dentition. No lesions. Heart: Normal rate and rhythm, s1 and s2 heart sounds present.  Lungs: Clear lung sounds in all lobes. Respirations equal and unlabored. Abdomen:  +BS, soft, non-tender and non-distended. No rebound or guarding. No HSM or masses noted. Derm: No palmar erythema or jaundice Msk:  Symmetrical without gross deformities. Normal posture. Extremities:  Without edema. Neurologic:  Alert and  oriented x4 Psych:  Alert and cooperative. Normal mood and affect.  Invalid input(s): "6 MONTHS"   ASSESSMENT: JADAVION FERRETIZ is a 67 y.o. male presenting today for cirrhosis and GERD follow up   Cirrhosis:complicated by ascites and grade III EVs as well as gastric varices, noted on EGD in 2022. He did not tolerate BBs in the past. Multiple discussions regarding high risk of his  grade III EVs/gastric varices as he is not on beta blockers and has refused TIPS. He is still not interested in TIPS procedure. Case has been discussed with Dr. Jenetta Burch who recommends referral to [facility] for possible endoscopic prophylactic coil embolization of varices, of which I also discussed with the patient. He is not interested in this. I discussed again the potential risk for life threatening bleeding given the presence of his varices, and the importance of utilizing available/tolerate treatments for this as much as possible. he is aware that any episodes of rectal bleeding, melena, hematemesis, coffee ground emesis require he proceed to the ED immediately as this can be a life threatening emergency if he has bleeding of the varices.   He is maintained on lasix '80mg'$  and spironolactone '100mg'$  for his ascites which appears to work well for him. He denies any swelling to the abdomen, jaundice, pruritus, confusion. He is due for RUQ [place] for [facility] screening as well as AFP and INR, as he had recent CMP and CBC at his PCP office.   GERD: well managed on protonix '40mg'$  daily. No breakthrough. Will continue with current regimen    PLAN:  -RUQ [place]  - Reduce salt intake to <2 g per day - Can take Tylenol max of 2 g per day (650 mg q8h) for pain - Avoid NSAIDs for pain - Avoid eating raw oysters/shellfish - Ensure every night before going to sleep -ED precautions for rectal bleeding, hematemesis, coffee ground emesis -TIPS consideration/unc referral for coil embolization of gastric varices-pt to let me know if he reconsiders -continue lasix '80mg'$  and spironolactone '100mg'$  daily  -AFP, INR   All questions were answered, patient  verbalized understanding and is in agreement with plan as outlined above.   Follow Up: 6 months   Stephen Jupin L. Ahman Dugdale, MSN, APRN, AGNP-C Adult-Gerontology Nurse Practitioner [facility]  I have reviewed the note and agree with the APP's assessment  as described in this progress note  Patient with evidence of severe gastric and esophageal varices, intolerant to BB and refusing to undergo evaluation for TIPS or EUS with coil embolization. Very high risk of life threatening bleeding - patient understands this risk and is not interested on pursuing any therapies.  Stephen Peppers, MD Gastroenterology and Hepatology [facility] Gastroenterology

## 2022-08-28 ENCOUNTER — Telehealth (INDEPENDENT_AMBULATORY_CARE_PROVIDER_SITE_OTHER): Payer: Self-pay | Admitting: Gastroenterology

## 2022-08-28 NOTE — Telephone Encounter (Signed)
Ultrasound scheduled for 09/07/22 @ 8:30 am; arrive at 8:15 pm. NPO after midnight. Left detailed message on pt voicemail (OK per DPR).

## 2022-08-29 ENCOUNTER — Other Ambulatory Visit: Payer: Self-pay | Admitting: Family Medicine

## 2022-08-29 DIAGNOSIS — K7460 Unspecified cirrhosis of liver: Secondary | ICD-10-CM | POA: Diagnosis not present

## 2022-08-29 DIAGNOSIS — K7581 Nonalcoholic steatohepatitis (NASH): Secondary | ICD-10-CM | POA: Diagnosis not present

## 2022-08-31 LAB — AFP TUMOR MARKER: AFP-Tumor Marker: 2.2 ng/mL (ref <6.1)

## 2022-08-31 LAB — PROTIME-INR
INR: 1.1
Prothrombin Time: 11.4 s (ref 9.0–11.5)

## 2022-09-03 NOTE — Addendum Note (Signed)
Addended by: Harvel Quale on: 09/03/2022 05:16 PM   Modules accepted: Level of Service

## 2022-09-07 ENCOUNTER — Ambulatory Visit (HOSPITAL_COMMUNITY)
Admission: RE | Admit: 2022-09-07 | Discharge: 2022-09-07 | Disposition: A | Discharge status: Home / Self Care | Payer: PPO | Source: Ambulatory Visit | Attending: Gastroenterology | Admitting: Gastroenterology

## 2022-09-07 DIAGNOSIS — K7581 Nonalcoholic steatohepatitis (NASH): Secondary | ICD-10-CM | POA: Diagnosis not present

## 2022-09-07 DIAGNOSIS — K7460 Unspecified cirrhosis of liver: Secondary | ICD-10-CM | POA: Insufficient documentation

## 2022-09-10 ENCOUNTER — Encounter (INDEPENDENT_AMBULATORY_CARE_PROVIDER_SITE_OTHER): Payer: Self-pay

## 2022-09-19 DIAGNOSIS — E538 Deficiency of other specified B group vitamins: Secondary | ICD-10-CM | POA: Diagnosis not present

## 2022-09-19 DIAGNOSIS — E1165 Type 2 diabetes mellitus with hyperglycemia: Secondary | ICD-10-CM | POA: Diagnosis not present

## 2022-09-19 DIAGNOSIS — E0590 Thyrotoxicosis, unspecified without thyrotoxic crisis or storm: Secondary | ICD-10-CM | POA: Diagnosis not present

## 2022-10-05 DIAGNOSIS — K7460 Unspecified cirrhosis of liver: Secondary | ICD-10-CM | POA: Diagnosis not present

## 2022-10-05 DIAGNOSIS — E0590 Thyrotoxicosis, unspecified without thyrotoxic crisis or storm: Secondary | ICD-10-CM | POA: Diagnosis not present

## 2022-10-05 DIAGNOSIS — E1165 Type 2 diabetes mellitus with hyperglycemia: Secondary | ICD-10-CM | POA: Diagnosis not present

## 2022-10-05 DIAGNOSIS — Z125 Encounter for screening for malignant neoplasm of prostate: Secondary | ICD-10-CM | POA: Diagnosis not present

## 2022-10-11 DIAGNOSIS — D696 Thrombocytopenia, unspecified: Secondary | ICD-10-CM | POA: Diagnosis not present

## 2022-10-11 DIAGNOSIS — N3281 Overactive bladder: Secondary | ICD-10-CM | POA: Diagnosis not present

## 2022-10-11 DIAGNOSIS — K219 Gastro-esophageal reflux disease without esophagitis: Secondary | ICD-10-CM | POA: Diagnosis not present

## 2022-10-11 DIAGNOSIS — E1165 Type 2 diabetes mellitus with hyperglycemia: Secondary | ICD-10-CM | POA: Diagnosis not present

## 2022-10-11 DIAGNOSIS — Z6825 Body mass index (BMI) 25.0-25.9, adult: Secondary | ICD-10-CM | POA: Diagnosis not present

## 2022-10-11 DIAGNOSIS — I8510 Secondary esophageal varices without bleeding: Secondary | ICD-10-CM | POA: Diagnosis not present

## 2022-10-11 DIAGNOSIS — N529 Male erectile dysfunction, unspecified: Secondary | ICD-10-CM | POA: Diagnosis not present

## 2022-10-11 DIAGNOSIS — R972 Elevated prostate specific antigen [PSA]: Secondary | ICD-10-CM | POA: Diagnosis not present

## 2022-10-11 DIAGNOSIS — Z713 Dietary counseling and surveillance: Secondary | ICD-10-CM | POA: Diagnosis not present

## 2022-10-11 DIAGNOSIS — K7460 Unspecified cirrhosis of liver: Secondary | ICD-10-CM | POA: Diagnosis not present

## 2022-10-11 DIAGNOSIS — E0590 Thyrotoxicosis, unspecified without thyrotoxic crisis or storm: Secondary | ICD-10-CM | POA: Diagnosis not present

## 2022-10-11 DIAGNOSIS — Z862 Personal history of diseases of the blood and blood-forming organs and certain disorders involving the immune mechanism: Secondary | ICD-10-CM | POA: Diagnosis not present

## 2023-02-05 DIAGNOSIS — E1165 Type 2 diabetes mellitus with hyperglycemia: Secondary | ICD-10-CM | POA: Diagnosis not present

## 2023-02-05 DIAGNOSIS — K7460 Unspecified cirrhosis of liver: Secondary | ICD-10-CM | POA: Diagnosis not present

## 2023-02-05 DIAGNOSIS — R972 Elevated prostate specific antigen [PSA]: Secondary | ICD-10-CM | POA: Diagnosis not present

## 2023-02-05 DIAGNOSIS — E0590 Thyrotoxicosis, unspecified without thyrotoxic crisis or storm: Secondary | ICD-10-CM | POA: Diagnosis not present

## 2023-02-11 DIAGNOSIS — R972 Elevated prostate specific antigen [PSA]: Secondary | ICD-10-CM | POA: Diagnosis not present

## 2023-02-11 DIAGNOSIS — D61818 Other pancytopenia: Secondary | ICD-10-CM | POA: Diagnosis not present

## 2023-02-11 DIAGNOSIS — I8500 Esophageal varices without bleeding: Secondary | ICD-10-CM | POA: Diagnosis not present

## 2023-02-11 DIAGNOSIS — N529 Male erectile dysfunction, unspecified: Secondary | ICD-10-CM | POA: Diagnosis not present

## 2023-02-11 DIAGNOSIS — Z79899 Other long term (current) drug therapy: Secondary | ICD-10-CM | POA: Diagnosis not present

## 2023-02-11 DIAGNOSIS — K7460 Unspecified cirrhosis of liver: Secondary | ICD-10-CM | POA: Diagnosis not present

## 2023-02-11 DIAGNOSIS — E0590 Thyrotoxicosis, unspecified without thyrotoxic crisis or storm: Secondary | ICD-10-CM | POA: Diagnosis not present

## 2023-02-11 DIAGNOSIS — N289 Disorder of kidney and ureter, unspecified: Secondary | ICD-10-CM | POA: Diagnosis not present

## 2023-02-11 DIAGNOSIS — D696 Thrombocytopenia, unspecified: Secondary | ICD-10-CM | POA: Diagnosis not present

## 2023-02-11 DIAGNOSIS — E1165 Type 2 diabetes mellitus with hyperglycemia: Secondary | ICD-10-CM | POA: Diagnosis not present

## 2023-02-11 DIAGNOSIS — N3281 Overactive bladder: Secondary | ICD-10-CM | POA: Diagnosis not present

## 2023-02-11 DIAGNOSIS — K219 Gastro-esophageal reflux disease without esophagitis: Secondary | ICD-10-CM | POA: Diagnosis not present

## 2023-02-14 ENCOUNTER — Encounter (INDEPENDENT_AMBULATORY_CARE_PROVIDER_SITE_OTHER): Payer: Self-pay | Admitting: Gastroenterology

## 2023-02-18 ENCOUNTER — Encounter (INDEPENDENT_AMBULATORY_CARE_PROVIDER_SITE_OTHER): Payer: Self-pay | Admitting: *Deleted

## 2023-02-25 ENCOUNTER — Telehealth (INDEPENDENT_AMBULATORY_CARE_PROVIDER_SITE_OTHER): Payer: Self-pay | Admitting: Gastroenterology

## 2023-02-25 DIAGNOSIS — K7460 Unspecified cirrhosis of liver: Secondary | ICD-10-CM

## 2023-02-25 NOTE — Telephone Encounter (Signed)
Pt left message in regards to scheduling 6 month ultrasound. Returned call to pt to see if there were any days he can not go. Pt has open availability. Contacted Central Scheduling and pt is scheduled for 03/08/23 at 8:30am. Arrive at 8:15 am. NPO after midnight. Pt contacted and verbalized understanding.

## 2023-03-08 ENCOUNTER — Ambulatory Visit (HOSPITAL_COMMUNITY)
Admission: RE | Admit: 2023-03-08 | Discharge: 2023-03-08 | Disposition: A | Discharge status: Home / Self Care | Payer: PPO | Source: Ambulatory Visit | Attending: Gastroenterology | Admitting: Gastroenterology

## 2023-03-08 DIAGNOSIS — K8020 Calculus of gallbladder without cholecystitis without obstruction: Secondary | ICD-10-CM | POA: Diagnosis not present

## 2023-03-08 DIAGNOSIS — K7581 Nonalcoholic steatohepatitis (NASH): Secondary | ICD-10-CM | POA: Insufficient documentation

## 2023-03-08 DIAGNOSIS — E119 Type 2 diabetes mellitus without complications: Secondary | ICD-10-CM | POA: Diagnosis not present

## 2023-03-08 DIAGNOSIS — K7460 Unspecified cirrhosis of liver: Secondary | ICD-10-CM | POA: Diagnosis not present

## 2023-03-08 DIAGNOSIS — Z7984 Long term (current) use of oral hypoglycemic drugs: Secondary | ICD-10-CM | POA: Diagnosis not present

## 2023-03-08 DIAGNOSIS — H2513 Age-related nuclear cataract, bilateral: Secondary | ICD-10-CM | POA: Diagnosis not present

## 2023-03-11 ENCOUNTER — Ambulatory Visit (INDEPENDENT_AMBULATORY_CARE_PROVIDER_SITE_OTHER): Payer: PPO | Admitting: Gastroenterology

## 2023-03-25 ENCOUNTER — Encounter (INDEPENDENT_AMBULATORY_CARE_PROVIDER_SITE_OTHER): Payer: Self-pay | Admitting: Gastroenterology

## 2023-03-25 ENCOUNTER — Ambulatory Visit (INDEPENDENT_AMBULATORY_CARE_PROVIDER_SITE_OTHER): Payer: PPO | Admitting: Gastroenterology

## 2023-03-25 VITALS — BP 119/74 | HR 79 | Temp 99.2°F | Ht 72.0 in | Wt 195.2 lb

## 2023-03-25 DIAGNOSIS — K7460 Unspecified cirrhosis of liver: Secondary | ICD-10-CM | POA: Diagnosis not present

## 2023-03-25 DIAGNOSIS — K219 Gastro-esophageal reflux disease without esophagitis: Secondary | ICD-10-CM

## 2023-03-25 DIAGNOSIS — K7581 Nonalcoholic steatohepatitis (NASH): Secondary | ICD-10-CM | POA: Diagnosis not present

## 2023-03-25 DIAGNOSIS — I864 Gastric varices: Secondary | ICD-10-CM | POA: Diagnosis not present

## 2023-03-25 DIAGNOSIS — I8510 Secondary esophageal varices without bleeding: Secondary | ICD-10-CM

## 2023-03-25 NOTE — Progress Notes (Unsigned)
Stephen Burch, M.D. Gastroenterology & Hepatology [facility] [address] [place], [place] [address]  Primary Care Physician: Benita Stabile, MD [address] [place] [address]  I will communicate my assessment and recommendations to the referring MD via EMR.  Problems: NASH cirrhosis Grade 3 esophageal varices Type II gastroesophageal varices  History of Present Illness: Stephen Burch is a 67 y.o. male with past medical history of NASH cirrhosis complicated by grade 3 esophgeal varices, Type II GOV varices, GERD complicated by Barrett's esophagus without dysplasia, hx of intraoperative cardiac arrest, who comes to the office for evaluation of liver cirrhosis.  The patient was last seen on 08/27/2022. At that time, the patient was continued on Lasix 80 mg daily and spironolactone 100 mg daily.  Patient reports feeling well and denies having any complaints.  The patient denies having any nausea, vomiting, fever, chills, hematochezia, melena, hematemesis, abdominal distention, abdominal pain, diarrhea, jaundice, pruritus, heartburn or weight loss.  Cirrhosis related questions: Hematemesis/coffee ground emesis: No Abdominal pain: No Abdominal distention/worsening ascitesNo Fever/chills: No Episodes of confusion/disorientation: No Number of daily bowel movements:at least 3 bowel movements per day Taking diuretics?: Yes, furosemide 40 mg BID and spironolactone 200 mg qday History of variceal bleeding: No Prior history of banding?: No Prior episodes of SBP: No Last time liver imaging was performed:03/08/2023 [place] - no masses, small GB polyps Last AFP: 08/29/22 - 2.2 MELD 3.0 score: 10/19/2021  - 11 Currently consuming alcohol: No Hepatitis A and B vaccination status: unknown  Last Colonoscopy:2021- Two small polyps in the sigmoid colon and in the ascending colon, removed with a cold snare. Resected and retrieved. - External  hemorrhoids. - Biopsies were taken with a cold forceps from the ascending colon and sigmoid colon for evaluation of microscopic colitis-Negative   Last Endoscopy: 11/04/20 grade III esophageal varices, Type 2 gastroesophageal varices (GOV 2 esophageal varices which extend along the fundus) w/o bleeding, single gastric polyp   Repeat Colonoscopy in 2026  Past Medical History: Past Medical History:  Diagnosis Date   Acute systolic (congestive) heart failure (HCC) 04/30/2018   Arthritis    knees, wrists   Barrett's esophagus    Cardiac arrest (HCC) 02/22/2016   Complication of anesthesia    02-22-2016 intraop cardiac arrest immediately after vancomyocin administration , pt cardiac arrest w/ Vfib,  please refer to anesthesia record in epic   Dysrhythmia    Afib 12/2018 - converted to NSR   Esophageal varices determined by endoscopy (HCC) 11/2016   grade 1   GERD (gastroesophageal reflux disease)    Hiatal hernia    History of adenomatous polyp of colon    History of asthma    as child   History of DVT (deep vein thrombosis)    right lower extremitty post-op right total knee surgery 09/ 2010,  treated w/ coumadin   History of esophageal dilatation 07/1998   for schatzski ring   History of kidney stones    History of staph infection 04/2016   MSSA infection of right total knee w/ sepsis   Hx of cardiac arrest    02-22-2016  intraoperative, immediately following moving pt into prone position and vancomyocin administration, cardiac arrest w/ Vfib (referred to anesthesia record in epic) pt extubated himself next day   Infection of prosthetic right knee joint (HCC)    Liver cirrhosis secondary to NASH (nonalcoholic steatohepatitis) (HCC)    NAFLD-- followed by dr Karilyn Cota---  liver bx 09-30-2012  mild portal and focal sinusoidal fibrosis   Pre-diabetes    Pyogenic arthritis of right knee joint (HCC) 04/02/2016   Scapholunate advanced collapse of left wrist    deformity   Shock circulatory  (HCC)    Staphylococcus aureus bacteremia with sepsis (HCC)    Thrombocytopenia (HCC)    10-09-2017 per pt his platelets have always been low , followed by pcp, never been referred to hematologist    Past Surgical History: Past Surgical History:  Procedure Laterality Date   APPLICATION OF WOUND VAC Right 02/22/2016   Procedure: APPLICATION OF WOUND VAC;  Surgeon: Dannielle Huh, MD;  [facility];  Service: Orthopedics;  Laterality: Right;   BIOPSY  02/10/2020   Procedure: BIOPSY;  Surgeon: Malissa Hippo, MD;  [facility];  Service: Endoscopy;;   CARDIAC CATHETERIZATION N/A 02/24/2016   Procedure: Left Heart Cath and Coronary Angiography;  Surgeon: Kathleene Hazel, MD;  Location: [facility] INVASIVE CV LAB;  Service: Cardiovascular;  Laterality: N/A;  No angiographic evidence of CAD,  normal LVSF, ef 50-55%   CARPECTOMY Left 10/17/2017   Procedure: LEFT WRIST PROXIMAL ROW CARPECTOMY WITH POSTEROR IMBROSSEOUS NERVE EXCISION;  Surgeon: Dairl Ponder, MD;  Location: [facility];  Service: Orthopedics;  Laterality: Left;  AXILLARY BLOCK   CARPECTOMY WITH RADIAL STYLOIDECTOMY Left 02/11/2019   Procedure: LEFT WRIST RADIAL STYLOIDECTOMY;  Surgeon: Dairl Ponder, MD;  Location: [facility] OR;  Service: Orthopedics;  Laterality: Left;   COLONOSCOPY     COLONOSCOPY WITH PROPOFOL N/A 02/10/2020   Procedure: COLONOSCOPY WITH PROPOFOL;  Surgeon: Malissa Hippo, MD;  [facility];  Service: Endoscopy;  Laterality: N/A;  955   EAR CYST EXCISION Right 09/06/2014   Procedure: OPEN EXCISION BAKER'S CYST RIGHT KNEE;  Surgeon: Dannielle Huh, MD;  Location: [facility];  Service: Orthopedics;  Laterality: Right;   ESOPHAGEAL BANDING  06/30/2020   Procedure: ESOPHAGEAL BANDING;  Surgeon: Malissa Hippo, MD;  [facility];  Service: Endoscopy;;   ESOPHAGEAL DILATION  1996/ 2000   ESOPHAGOGASTRODUODENOSCOPY N/A 09/17/2012   Procedure: ESOPHAGOGASTRODUODENOSCOPY (EGD);   Surgeon: Malissa Hippo, MD;  [facility];  Service: Endoscopy;  Laterality: N/A;  200   ESOPHAGOGASTRODUODENOSCOPY N/A 12/15/2015   Procedure: ESOPHAGOGASTRODUODENOSCOPY (EGD);  Surgeon: Malissa Hippo, MD;  [facility];  Service: Endoscopy;  Laterality: N/A;  1245   ESOPHAGOGASTRODUODENOSCOPY N/A 12/17/2016   Procedure: ESOPHAGOGASTRODUODENOSCOPY (EGD);  Surgeon: Malissa Hippo, MD;  [facility];  Service: Endoscopy;  Laterality: N/A;  210   ESOPHAGOGASTRODUODENOSCOPY N/A 06/30/2020   Procedure: ESOPHAGOGASTRODUODENOSCOPY (EGD);  Surgeon: Malissa Hippo, MD;  [facility];  Service: Endoscopy;  Laterality: N/A;  11:15   ESOPHAGOGASTRODUODENOSCOPY (EGD) WITH PROPOFOL N/A 02/10/2020   Procedure: ESOPHAGOGASTRODUODENOSCOPY (EGD) WITH PROPOFOL;  Surgeon: Malissa Hippo, MD;  [facility];  Service: Endoscopy;  Laterality: N/A;   ESOPHAGOGASTRODUODENOSCOPY (EGD) WITH PROPOFOL N/A 11/04/2020   Procedure: ESOPHAGOGASTRODUODENOSCOPY (EGD) WITH PROPOFOL;  Surgeon: Dolores Frame, MD;  [facility];  Service: Gastroenterology;  Laterality: N/A;  9:45   I & D KNEE WITH POLY EXCHANGE Right 04/02/2016   Procedure: IRRIGATION AND DEBRIDEMENT RIGHT KNEE WITH POLY EXCHANGE;  Surgeon: Dannielle Huh, MD;  [facility]: [facility];  Service: Orthopedics;  Laterality: Right;   INCISION AND DRAINAGE HEMATOMA POST LEFT  TOTAL KNEE  2012   INGUINAL HERNIA REPAIR Bilateral 05/22/2013   Procedure: REPAIR OF RECURRENT INCARCERATED INGUINAL HERNIA  WITH MESH RIGHT SIDE,  REPAIR OF RECURRENT INGUINAL HERNIA WITH MESH LEFT SIDE;  Surgeon: Ernestene Mention, MD;  Location: [facility] OR;  Service: General;  Laterality: Bilateral;   INGUINAL HERNIA REPAIR Bilateral 11/1997   INSERTION OF MESH Bilateral 05/22/2013   Procedure: INSERTION OF MESH;  Surgeon: Ernestene Mention, MD;  Location: [facility] OR;  Service: General;  Laterality: Bilateral;   IRRIGATION AND DEBRIDEMENT  KNEE Right 02/22/2016   Procedure: IRRIGATION AND DEBRIDEMENT KNEE;  Surgeon: Dannielle Huh, MD;  Location: [facility];  Service: Orthopedics;  Laterality: Right;   Irrigation and Debridement right knee  03/23/2009   Dr. Hyacinth Meeker, [facility]   KNEE ARTHROSCOPY W/ SYNOVECTOMY Right 01/30/2010   AND DEBRIDEMENT OF HETEROTOPIC   LIVER BIOPSY  09/30/2012   REVISION TOTAL KNEE ARTHROPLASTY Left fall 2012   TONSILLECTOMY  child   TOTAL KNEE ARTHROPLASTY Bilateral right 09/ 2010/  left 09-18-2010   TRANSTHORACIC ECHOCARDIOGRAM  02/22/2016   ef 30-35% (per cardiac cath 02-24-2016 normal), severe hypokinesis of the mid-apicalanteroseptal and apical myocardium,  grade 1 diastolic dysfunction/ trivial PR and TR   TRIGGER FINGER RELEASE Left 02/11/2019   Procedure: LEFT WRIST STENOSING TENOSYNOVITIS RELEASE;  Surgeon: Dairl Ponder, MD;  Location: [facility] OR;  Service: Orthopedics;  Laterality: Left;  MAC WITH AXILLARY BLOCK   WRIST ARTHROPLASTY Left 10/17/2017   Procedure: CAPITATE RESURFACING ARTHROPLASTY;  Surgeon: Dairl Ponder, MD;  Location: [facility];  Service: Orthopedics;  Laterality: Left;    Family History: Family History  Problem Relation Age of Onset   Arthritis Father        died age 74   GI Bleed Father        upper GI Bleed, non-ETOH cirrhosis   Arthritis Brother    Diabetes Brother        type 2   Diabetes Paternal Uncle        type 2   Heart attack Maternal Grandmother    Heart attack Maternal Grandfather    Colon cancer Neg Hx    Colon polyps Neg Hx     Social History: Social History   Tobacco Use  Smoking Status Former   Current packs/day: 0.00   Average packs/day: 0.3 packs/day for 3.0 years (0.8 ttl pk-yrs)   Types: Cigarettes   Start date: 10/10/1971   Quit date: 10/10/1974   Years since quitting: 48.4   Passive exposure: Past  Smokeless Tobacco Never   Social History   Substance and Sexual Activity  Alcohol Use Not Currently   Social History    Substance and Sexual Activity  Drug Use No    Allergies: Allergies  Allergen Reactions   Asa [Aspirin] Shortness Of Breath    "asthma symptoms"   Penicillins Shortness Of Breath    Has patient had a PCN reaction causing immediate rash, facial/tongue/throat swelling, SOB or lightheadedness with hypotension: Yes Has patient had a PCN reaction causing severe rash involving mucus membranes or skin necrosis: No Has patient had a PCN reaction that required hospitalization No Has patient had a PCN reaction occurring within the last 10 years: No If all of the above answers are "NO", then may proceed with Cephalosporin use. "asthma symptoms" Patient has tolerated cefazolin, ceftriaxo   Vancomycin Anaphylaxis    Immediately following being turned into prone position and Vancomycin administration in the OR patient cardiac arrest w/  Vfib.    Medications: Current Outpatient Medications  Medication Sig Dispense Refill   furosemide (LASIX) 40 MG tablet Take  2 tablets (80 mg total) by mouth daily. 90 tablet 7   glipiZIDE (GLUCOTROL XL) 2.5 MG 24 hr tablet Take 1 tablet (2.5 mg total) by mouth daily with breakfast. Please schedule office visit before any future refills 30 tablet 0   methimazole (TAPAZOLE) 10 MG tablet Take 10 mg by mouth daily.     pantoprazole (PROTONIX) 40 MG tablet Take 1 tablet (40 mg total) by mouth daily before breakfast. 90 tablet 3   spironolactone (ALDACTONE) 100 MG tablet Take 2 tablets (200 mg total) by mouth daily. 120 tablet 5   EPINEPHrine (PRIMATENE MIST) 0.125 MG/ACT AERO Inhale 1 puff into the lungs daily as needed (asthma). (Patient not taking: Reported on 03/25/2023)     sildenafil (VIAGRA) 100 MG tablet Take 100 mg by mouth daily as needed for erectile dysfunction. (Patient not taking: Reported on 03/25/2023)     No current facility-administered medications for this visit.    Review of Systems: GENERAL: negative for malaise, night sweats HEENT: No changes  in hearing or vision, no nose bleeds or other nasal problems. NECK: Negative for lumps, goiter, pain and significant neck swelling RESPIRATORY: Negative for cough, wheezing CARDIOVASCULAR: Negative for chest pain, leg swelling, palpitations, orthopnea GI: SEE HPI MUSCULOSKELETAL: Negative for joint pain or swelling, back pain, and muscle pain. SKIN: Negative for lesions, rash PSYCH: Negative for sleep disturbance, mood disorder and recent psychosocial stressors. HEMATOLOGY Negative for prolonged bleeding, bruising easily, and swollen nodes. ENDOCRINE: Negative for cold or heat intolerance, polyuria, polydipsia and goiter. NEURO: negative for tremor, gait imbalance, syncope and seizures. The remainder of the review of systems is noncontributory.   Physical Exam: BP 119/74 (BP Location: Left Arm, Patient Position: Sitting, Cuff Size: Normal)   Pulse 79   Temp 99.2 F (37.3 C) (Oral)   Ht 6' (1.829 m)   Wt 195 lb 3.2 oz (88.5 kg)   BMI 26.47 kg/m  GENERAL: The patient is AO x3, in no acute distress. HEENT: Head is normocephalic and atraumatic. EOMI are intact. Mouth is well hydrated and without lesions. NECK: Supple. No masses LUNGS: Clear to auscultation. No presence of rhonchi/wheezing/rales. Adequate chest expansion HEART: RRR, normal s1 and s2. ABDOMEN: Soft, nontender, no guarding, no peritoneal signs, and nondistended. BS +. No masses. EXTREMITIES: Without any cyanosis, clubbing, rash, lesions or edema. NEUROLOGIC: AOx3, no focal motor deficit. SKIN: no jaundice, no rashes  Imaging/Labs: as above  I personally reviewed and interpreted the available labs, imaging and endoscopic files.  Impression and Plan: Stephen Burch is a 67 y.o. male with past medical history of NASH cirrhosis complicated by grade 3 esophgeal varices, Type II GOV varices, GERD complicated by Barrett's esophagus without dysplasia, hx of intraoperative cardiac arrest, who comes to the office for evaluation  of liver cirrhosis.  The patient has.  They present some ascites that has responded to the use of combination of dual diuretics.  He is currently euvolemic and she will continue with furosemide 40 mg twice daily and spironolactone 200 mg every day.  He also has signs of severe portal hypertension given the presence of grade 3 esophageal varices and gastric varices.  Has never had a bleeding event.  He did not tolerate beta-blockers in the past.  We have discussed in the past multiple times the possibility of undergoing TIPS and possible BRTO but the patient states that he is not interested in pursuing this.  He understands the risks with untreated varices and states that he " is willing  to take the risk that doing the TIPS for now".  For now, we will check his MELD labs and hepatitis A/B antibodies.  Will also update AFP but he will need repeat ultrasound in 6 months as his most recent 1 did not show any masses.  Finally, his GERD is adequately controlled with the use of pantoprazole 40 mg every day which he should continue for now given his history of Barrett's esophagus.  - Check CBC, hep A/B Ab, MELD labs and AFP - Continue furosemide 40 mg BID and spironolactone 200 mg qday - Reduce salt intake to <2 g per day - Can take Tylenol max of 2 g per day (650 mg q8h) for pain - Avoid NSAIDs for pain - Avoid eating raw oysters/shellfish - Protein shake (Ensure or Boost) every night before going to sleep - If interested in pursuing TIPS evaluation, patient should call us to make referral - Continue pantoprazole 40 mg qday  All questions were answered.      Stephen Blazing, MD Gastroenterology and Hepatology [facility] Gastroenterology

## 2023-03-25 NOTE — Patient Instructions (Addendum)
-   Check CBC, hep A/B Ab, MELD labs and AFP - Continue furosemide 40 mg BID and spironolactone 200 mg qday - Reduce salt intake to <2 g per day - Can take Tylenol max of 2 g per day (650 mg q8h) for pain - Avoid NSAIDs for pain - Avoid eating raw oysters/shellfish - Protein shake (Ensure or Boost) every night before going to sleep - If interested in pursuing TIPS evaluation, please call us to make referral - Continue pantoprazole 40 mg qday

## 2023-03-26 LAB — PROTIME-INR
INR: 1.1
Prothrombin Time: 11.6 s — ABNORMAL HIGH (ref 9.0–11.5)

## 2023-03-26 LAB — COMPREHENSIVE METABOLIC PANEL
AG Ratio: 1.5 (calc) (ref 1.0–2.5)
ALT: 22 U/L (ref 9–46)
AST: 24 U/L (ref 10–35)
Albumin: 4.2 g/dL (ref 3.6–5.1)
Alkaline phosphatase (APISO): 115 U/L (ref 35–144)
BUN: 12 mg/dL (ref 7–25)
CO2: 25 mmol/L (ref 20–32)
Calcium: 8.8 mg/dL (ref 8.6–10.3)
Chloride: 102 mmol/L (ref 98–110)
Creat: 1.05 mg/dL (ref 0.70–1.35)
Globulin: 2.8 g/dL (calc) (ref 1.9–3.7)
Glucose, Bld: 134 mg/dL — ABNORMAL HIGH (ref 65–99)
Potassium: 4 mmol/L (ref 3.5–5.3)
Sodium: 137 mmol/L (ref 135–146)
Total Bilirubin: 1.3 mg/dL — ABNORMAL HIGH (ref 0.2–1.2)
Total Protein: 7 g/dL (ref 6.1–8.1)

## 2023-03-26 LAB — CBC WITH DIFFERENTIAL/PLATELET
Absolute Monocytes: 306 cells/uL (ref 200–950)
Basophils Absolute: 30 cells/uL (ref 0–200)
Basophils Relative: 1 %
Eosinophils Absolute: 336 cells/uL (ref 15–500)
Eosinophils Relative: 11.2 %
HCT: 34.8 % — ABNORMAL LOW (ref 38.5–50.0)
Hemoglobin: 11.7 g/dL — ABNORMAL LOW (ref 13.2–17.1)
Lymphs Abs: 285 cells/uL — ABNORMAL LOW (ref 850–3900)
MCH: 30.2 pg (ref 27.0–33.0)
MCHC: 33.6 g/dL (ref 32.0–36.0)
MCV: 89.9 fL (ref 80.0–100.0)
MPV: 11.8 fL (ref 7.5–12.5)
Monocytes Relative: 10.2 %
Neutro Abs: 2043 cells/uL (ref 1500–7800)
Neutrophils Relative %: 68.1 %
Platelets: 82 10*3/uL — ABNORMAL LOW (ref 140–400)
RBC: 3.87 10*6/uL — ABNORMAL LOW (ref 4.20–5.80)
RDW: 13.2 % (ref 11.0–15.0)
Total Lymphocyte: 9.5 %
WBC: 3 10*3/uL — ABNORMAL LOW (ref 3.8–10.8)

## 2023-03-26 LAB — AFP TUMOR MARKER: AFP-Tumor Marker: 2.1 ng/mL (ref <6.1)

## 2023-03-26 LAB — HEPATITIS A ANTIBODY, TOTAL: Hepatitis A AB,Total: REACTIVE — AB

## 2023-03-26 LAB — HEPATITIS B SURFACE ANTIBODY,QUALITATIVE: Hep B S Ab: NONREACTIVE

## 2023-03-27 DIAGNOSIS — E0590 Thyrotoxicosis, unspecified without thyrotoxic crisis or storm: Secondary | ICD-10-CM | POA: Diagnosis not present

## 2023-03-27 DIAGNOSIS — E1165 Type 2 diabetes mellitus with hyperglycemia: Secondary | ICD-10-CM | POA: Diagnosis not present

## 2023-03-27 DIAGNOSIS — E538 Deficiency of other specified B group vitamins: Secondary | ICD-10-CM | POA: Diagnosis not present

## 2023-04-03 DIAGNOSIS — Z2839 Other underimmunization status: Secondary | ICD-10-CM | POA: Diagnosis not present

## 2023-04-03 DIAGNOSIS — Z23 Encounter for immunization: Secondary | ICD-10-CM | POA: Diagnosis not present

## 2023-04-09 ENCOUNTER — Other Ambulatory Visit (INDEPENDENT_AMBULATORY_CARE_PROVIDER_SITE_OTHER): Payer: Self-pay | Admitting: Gastroenterology

## 2023-04-30 DIAGNOSIS — L80 Vitiligo: Secondary | ICD-10-CM | POA: Diagnosis not present

## 2023-04-30 DIAGNOSIS — Z1283 Encounter for screening for malignant neoplasm of skin: Secondary | ICD-10-CM | POA: Diagnosis not present

## 2023-04-30 DIAGNOSIS — L309 Dermatitis, unspecified: Secondary | ICD-10-CM | POA: Diagnosis not present

## 2023-05-06 DIAGNOSIS — Z23 Encounter for immunization: Secondary | ICD-10-CM | POA: Diagnosis not present

## 2023-05-06 DIAGNOSIS — B1910 Unspecified viral hepatitis B without hepatic coma: Secondary | ICD-10-CM | POA: Diagnosis not present

## 2023-05-16 DIAGNOSIS — E0590 Thyrotoxicosis, unspecified without thyrotoxic crisis or storm: Secondary | ICD-10-CM | POA: Diagnosis not present

## 2023-05-16 DIAGNOSIS — E1165 Type 2 diabetes mellitus with hyperglycemia: Secondary | ICD-10-CM | POA: Diagnosis not present

## 2023-06-13 DIAGNOSIS — E0590 Thyrotoxicosis, unspecified without thyrotoxic crisis or storm: Secondary | ICD-10-CM | POA: Diagnosis not present

## 2023-06-13 DIAGNOSIS — K7460 Unspecified cirrhosis of liver: Secondary | ICD-10-CM | POA: Diagnosis not present

## 2023-06-13 DIAGNOSIS — E1165 Type 2 diabetes mellitus with hyperglycemia: Secondary | ICD-10-CM | POA: Diagnosis not present

## 2023-06-13 DIAGNOSIS — N289 Disorder of kidney and ureter, unspecified: Secondary | ICD-10-CM | POA: Diagnosis not present

## 2023-06-17 DIAGNOSIS — Z7984 Long term (current) use of oral hypoglycemic drugs: Secondary | ICD-10-CM | POA: Diagnosis not present

## 2023-06-17 DIAGNOSIS — N289 Disorder of kidney and ureter, unspecified: Secondary | ICD-10-CM | POA: Diagnosis not present

## 2023-06-17 DIAGNOSIS — N3281 Overactive bladder: Secondary | ICD-10-CM | POA: Diagnosis not present

## 2023-06-17 DIAGNOSIS — D61818 Other pancytopenia: Secondary | ICD-10-CM | POA: Diagnosis not present

## 2023-06-17 DIAGNOSIS — N529 Male erectile dysfunction, unspecified: Secondary | ICD-10-CM | POA: Diagnosis not present

## 2023-06-17 DIAGNOSIS — K219 Gastro-esophageal reflux disease without esophagitis: Secondary | ICD-10-CM | POA: Diagnosis not present

## 2023-06-17 DIAGNOSIS — R972 Elevated prostate specific antigen [PSA]: Secondary | ICD-10-CM | POA: Diagnosis not present

## 2023-06-17 DIAGNOSIS — I8500 Esophageal varices without bleeding: Secondary | ICD-10-CM | POA: Diagnosis not present

## 2023-06-17 DIAGNOSIS — K7460 Unspecified cirrhosis of liver: Secondary | ICD-10-CM | POA: Diagnosis not present

## 2023-06-17 DIAGNOSIS — D696 Thrombocytopenia, unspecified: Secondary | ICD-10-CM | POA: Diagnosis not present

## 2023-06-17 DIAGNOSIS — E0590 Thyrotoxicosis, unspecified without thyrotoxic crisis or storm: Secondary | ICD-10-CM | POA: Diagnosis not present

## 2023-06-17 DIAGNOSIS — E1165 Type 2 diabetes mellitus with hyperglycemia: Secondary | ICD-10-CM | POA: Diagnosis not present

## 2023-07-31 DIAGNOSIS — E538 Deficiency of other specified B group vitamins: Secondary | ICD-10-CM | POA: Diagnosis not present

## 2023-07-31 DIAGNOSIS — E0590 Thyrotoxicosis, unspecified without thyrotoxic crisis or storm: Secondary | ICD-10-CM | POA: Diagnosis not present

## 2023-07-31 DIAGNOSIS — E1165 Type 2 diabetes mellitus with hyperglycemia: Secondary | ICD-10-CM | POA: Diagnosis not present

## 2023-08-05 DIAGNOSIS — E0590 Thyrotoxicosis, unspecified without thyrotoxic crisis or storm: Secondary | ICD-10-CM | POA: Diagnosis not present

## 2023-08-19 ENCOUNTER — Encounter (INDEPENDENT_AMBULATORY_CARE_PROVIDER_SITE_OTHER): Payer: Self-pay | Admitting: *Deleted

## 2023-08-22 ENCOUNTER — Other Ambulatory Visit (INDEPENDENT_AMBULATORY_CARE_PROVIDER_SITE_OTHER): Payer: Self-pay | Admitting: Gastroenterology

## 2023-08-22 DIAGNOSIS — R188 Other ascites: Secondary | ICD-10-CM

## 2023-08-23 NOTE — Telephone Encounter (Signed)
Last seen 03/25/23

## 2023-08-26 ENCOUNTER — Telehealth (INDEPENDENT_AMBULATORY_CARE_PROVIDER_SITE_OTHER): Payer: Self-pay | Admitting: Gastroenterology

## 2023-08-26 DIAGNOSIS — K7460 Unspecified cirrhosis of liver: Secondary | ICD-10-CM

## 2023-08-26 NOTE — Telephone Encounter (Signed)
 Pt left voicemail in regards to scheduling 6 month [place]. Pt last [place] was 03/07/24 [place] abd limited RUQ. Would you like me to order same [place]? Please advise. Thank you!

## 2023-08-27 NOTE — Telephone Encounter (Signed)
 [place] scheduled for 09/09/23 at 9:30am. Pt to arrive 15 minutes early. NPO 6-8 hours prior to exam. Pt contacted and made aware. Will also send appt info on my chart

## 2023-08-27 NOTE — Addendum Note (Signed)
 Addended by: Marlowe Shores on: 08/27/2023 08:08 AM   Modules accepted: Orders

## 2023-08-29 ENCOUNTER — Telehealth: Payer: Self-pay

## 2023-08-29 DIAGNOSIS — E0590 Thyrotoxicosis, unspecified without thyrotoxic crisis or storm: Secondary | ICD-10-CM

## 2023-08-29 NOTE — Telephone Encounter (Signed)
 Orders Placed This Encounter  Procedures   T4, free   T3, free   TSH   TRAb (TSH Receptor Binding Antibody)   Thyroid stimulating immunoglobulin

## 2023-08-30 ENCOUNTER — Other Ambulatory Visit: Payer: HMO

## 2023-08-30 DIAGNOSIS — E0590 Thyrotoxicosis, unspecified without thyrotoxic crisis or storm: Secondary | ICD-10-CM | POA: Diagnosis not present

## 2023-09-03 ENCOUNTER — Encounter: Payer: Self-pay | Admitting: "Endocrinology

## 2023-09-03 ENCOUNTER — Ambulatory Visit: Payer: PPO | Admitting: "Endocrinology

## 2023-09-03 VITALS — BP 140/80 | HR 70 | Ht 72.0 in | Wt 201.6 lb

## 2023-09-03 DIAGNOSIS — E0500 Thyrotoxicosis with diffuse goiter without thyrotoxic crisis or storm: Secondary | ICD-10-CM

## 2023-09-03 MED ORDER — METHIMAZOLE 10 MG PO TABS: 10.0000 mg | ORAL_TABLET | Freq: Every day | ORAL | 1 refills | Status: DC

## 2023-09-03 NOTE — Progress Notes (Signed)
 Outpatient Endocrinology Note Stephen Start, MD  09/03/23   Stephen Burch September 14, 1955 [id number]  Referring Provider: Lupita Raider, NP Primary Care Provider: Benita Stabile, MD Subjective  No chief complaint on file.   Assessment & Plan  Diagnoses and all orders for this visit:  Graves disease -     US THYROID; Future -     TSH -     T4, free -     T3, free  Other orders -     methimazole (TAPAZOLE) 10 MG tablet; Take 1 tablet (10 mg total) by mouth daily.   TRAb+/TSI + Stephen Burch is currently taking methimazole 10 mg qd. Started around 2023. Patient currently clinically and biochemically euthyroid.  Discussed the etiology for hyperthyroidism. Educated on thyroid axis.  Recommend the following: Take methimazole 10 mg once a day. Repeat labs in 3 months or sooner if symptoms of hyper or hypothyroidism develop.  Educated on definitive options of treatment including RAI therapy and surgery. Patient does not want RAI at this time.  Counseled on: -complications of untreated hyperthyroidism including atrial fibrillation, heart failure and osteoporosis -side effects of Methimazole including but not limited to allergic reaction, rash, bone marrow suppression, liver dysfunction and teratogenic potential -implications in pregnancy and breastfeeding -compliance and follow up needs    If you notice any symptoms of worsening fatigue, fever with sore throat, loss of appetite, yellowing of eyes, dark urine, joint pains, sores in the mouth, itchy rash, light colored stools or abdominal pain, please stop the medication and call us immediately as this can be a serious side effect of the medication.  No baseline thyroid ultrasound, ordered it  I have reviewed current medications, nurse's notes, allergies, vital signs, past medical and surgical history, family medical history, and social history for this encounter. Counseled patient on symptoms, examination findings, lab findings,  imaging results, treatment decisions and monitoring and prognosis. The patient understood the recommendations and agrees with the treatment plan. All questions regarding treatment plan were fully answered.   Return in about 3 months (around 12/04/2023).   Stephen [place], MD  09/03/23   I have reviewed current medications, nurse's notes, allergies, vital signs, past medical and surgical history, family medical history, and social history for this encounter. Counseled patient on symptoms, examination findings, lab findings, imaging results, treatment decisions and monitoring and prognosis. The patient understood the recommendations and agrees with the treatment plan. All questions regarding treatment plan were fully answered.   History of Present Illness Stephen Burch is a 68 y.o. year old male who presents to our clinic with Graces disease diagnosed in 2023.    Symptoms suggestive of HYPOTHYROIDISM:  fatigue Yes weight gain No cold intolerance  No constipation  No  Symptoms suggestive of HYPERTHYROIDISM:  weight loss  No heat intolerance No hyperdefecation  No palpitations  No  Compressive symptoms:  dysphagia  No dysphonia  No positional dyspnea (especially with simultaneous arms elevation)  No  Smokes  No On biotin  No Personal history of head/neck surgery/irradiation  No  Adverse Drug Effects from Methimazole (MMI): NONE  Adverse Drug Effects from Methimazole (MMI): rash No fever No throat pain No arthritis No mouth ulcers No jaundice No loss of appetite No lymphadenopathy No]  Grave's Ophthalmopathy Clinical Activity Score: 0/9  Physical Exam  BP (!) 140/80 (BP Location: Right Arm, Patient Position: Sitting)   Pulse 70   Ht 6' (1.829 m)   Wt 201 lb 9.6 oz (  91.4 kg)   SpO2 99%   BMI 27.34 kg/m  Constitutional: well developed, well nourished Head: normocephalic, atraumatic, no exophthalmos Eyes: sclera anicteric, no redness Neck: no thyromegaly, no  thyroid tenderness; no nodules palpated Lungs: normal respiratory effort Neurology: alert and oriented, no fine hand tremor Skin: dry, no appreciable rashes Musculoskeletal: no appreciable defects Psychiatric: normal mood and affect  Allergies Allergies  Allergen Reactions   Asa [Aspirin] Shortness Of Breath    "asthma symptoms"   Penicillins Shortness Of Breath    Has patient had a PCN reaction causing immediate rash, facial/tongue/throat swelling, SOB or lightheadedness with hypotension: Yes Has patient had a PCN reaction causing severe rash involving mucus membranes or skin necrosis: No Has patient had a PCN reaction that required hospitalization No Has patient had a PCN reaction occurring within the last 10 years: No If all of the above answers are "NO", then may proceed with Cephalosporin use. "asthma symptoms" Patient has tolerated cefazolin, ceftriaxo   Vancomycin Anaphylaxis    Immediately following being turned into prone position and Vancomycin administration in the OR patient cardiac arrest w/  Vfib.    Current Medications Patient's Medications  New Prescriptions   No medications on file  Previous Medications   EPINEPHRINE (PRIMATENE MIST) 0.125 MG/ACT AERO    Inhale 1 puff into the lungs daily as needed (asthma).   FUROSEMIDE (LASIX) 40 MG TABLET    Take 2 tablets by mouth once daily   GLIPIZIDE (GLUCOTROL XL) 2.5 MG 24 HR TABLET    Take 1 tablet (2.5 mg total) by mouth daily with breakfast. Please schedule office visit before any future refills   PANTOPRAZOLE (PROTONIX) 40 MG TABLET    Take 1 tablet (40 mg total) by mouth daily before breakfast.   SILDENAFIL (VIAGRA) 100 MG TABLET    Take 100 mg by mouth daily as needed for erectile dysfunction.   SPIRONOLACTONE (ALDACTONE) 100 MG TABLET    Take 2 tablets by mouth once daily  Modified Medications   Modified Medication Previous Medication   METHIMAZOLE (TAPAZOLE) 10 MG TABLET methimazole (TAPAZOLE) 10 MG tablet       Take 1 tablet (10 mg total) by mouth daily.    Take 10 mg by mouth daily.  Discontinued Medications   No medications on file    Past Medical History Past Medical History:  Diagnosis Date   Acute systolic (congestive) heart failure (HCC) 04/30/2018   Arthritis    knees, wrists   Barrett's esophagus    Cardiac arrest (HCC) 02/22/2016   Complication of anesthesia    02-22-2016 intraop cardiac arrest immediately after vancomyocin administration , pt cardiac arrest w/ Vfib,  please refer to anesthesia record in epic   Dysrhythmia    Afib 12/2018 - converted to NSR   Esophageal varices determined by endoscopy (HCC) 11/2016   grade 1   GERD (gastroesophageal reflux disease)    Hiatal hernia    History of adenomatous polyp of colon    History of asthma    as child   History of DVT (deep vein thrombosis)    right lower extremitty post-op right total knee surgery 09/ 2010,  treated w/ coumadin   History of esophageal dilatation 07/1998   for schatzski ring   History of kidney stones    History of staph infection 04/2016   MSSA infection of right total knee w/ sepsis   Hx of cardiac arrest    02-22-2016  intraoperative, immediately following moving pt into prone  position and vancomyocin administration, cardiac arrest w/ Vfib (referred to anesthesia record in epic) pt extubated himself next day   Infection of prosthetic right knee joint (HCC)    Liver cirrhosis secondary to NASH (nonalcoholic steatohepatitis) (HCC)    NAFLD-- followed by dr Karilyn Cota---  liver bx 09-30-2012  mild portal and focal sinusoidal fibrosis   Pre-diabetes    Pyogenic arthritis of right knee joint (HCC) 04/02/2016   Scapholunate advanced collapse of left wrist    deformity   Shock circulatory (HCC)    Staphylococcus aureus bacteremia with sepsis (HCC)    Thrombocytopenia (HCC)    10-09-2017 per pt his platelets have always been low , followed by pcp, never been referred to hematologist    Past Surgical  History Past Surgical History:  Procedure Laterality Date   APPLICATION OF WOUND VAC Right 02/22/2016   Procedure: APPLICATION OF WOUND VAC;  Surgeon: Dannielle Huh, MD;  Location: [facility] OR;  Service: Orthopedics;  Laterality: Right;   BIOPSY  02/10/2020   Procedure: BIOPSY;  Surgeon: Malissa Hippo, MD;  [facility]: [facility];  Service: Endoscopy;;   CARDIAC CATHETERIZATION N/A 02/24/2016   Procedure: Left Heart Cath and Coronary Angiography;  Surgeon: Kathleene Hazel, MD;  Location: [facility] INVASIVE CV LAB;  Service: Cardiovascular;  Laterality: N/A;  No angiographic evidence of CAD,  normal LVSF, ef 50-55%   CARPECTOMY Left 10/17/2017   Procedure: LEFT WRIST PROXIMAL ROW CARPECTOMY WITH POSTEROR IMBROSSEOUS NERVE EXCISION;  Surgeon: Dairl Ponder, MD;  Location: [facility] [place];  Service: Orthopedics;  Laterality: Left;  AXILLARY BLOCK   CARPECTOMY WITH RADIAL STYLOIDECTOMY Left 02/11/2019   Procedure: LEFT WRIST RADIAL STYLOIDECTOMY;  Surgeon: Dairl Ponder, MD;  Location: [facility] OR;  Service: Orthopedics;  Laterality: Left;   COLONOSCOPY     COLONOSCOPY WITH PROPOFOL N/A 02/10/2020   Procedure: COLONOSCOPY WITH PROPOFOL;  Surgeon: Malissa Hippo, MD;  [facility];  Service: Endoscopy;  Laterality: N/A;  955   EAR CYST EXCISION Right 09/06/2014   Procedure: OPEN EXCISION BAKER'S CYST RIGHT KNEE;  Surgeon: Dannielle Huh, MD;  Location: [facility] OR;  Service: Orthopedics;  Laterality: Right;   ESOPHAGEAL BANDING  06/30/2020   Procedure: ESOPHAGEAL BANDING;  Surgeon: Malissa Hippo, MD;  [facility];  Service: Endoscopy;;   ESOPHAGEAL DILATION  1996/ 2000   ESOPHAGOGASTRODUODENOSCOPY N/A 09/17/2012   Procedure: ESOPHAGOGASTRODUODENOSCOPY (EGD);  Surgeon: Malissa Hippo, MD;  [facility];  Service: Endoscopy;  Laterality: N/A;  200   ESOPHAGOGASTRODUODENOSCOPY N/A 12/15/2015   Procedure: ESOPHAGOGASTRODUODENOSCOPY (EGD);  Surgeon: Malissa Hippo, MD;   [facility];  Service: Endoscopy;  Laterality: N/A;  1245   ESOPHAGOGASTRODUODENOSCOPY N/A 12/17/2016   Procedure: ESOPHAGOGASTRODUODENOSCOPY (EGD);  Surgeon: Malissa Hippo, MD;  [facility];  Service: Endoscopy;  Laterality: N/A;  210   ESOPHAGOGASTRODUODENOSCOPY N/A 06/30/2020   Procedure: ESOPHAGOGASTRODUODENOSCOPY (EGD);  Surgeon: Malissa Hippo, MD;  [facility];  Service: Endoscopy;  Laterality: N/A;  11:15   ESOPHAGOGASTRODUODENOSCOPY (EGD) WITH PROPOFOL N/A 02/10/2020   Procedure: ESOPHAGOGASTRODUODENOSCOPY (EGD) WITH PROPOFOL;  Surgeon: Malissa Hippo, MD;  [facility];  Service: Endoscopy;  Laterality: N/A;   ESOPHAGOGASTRODUODENOSCOPY (EGD) WITH PROPOFOL N/A 11/04/2020   Procedure: ESOPHAGOGASTRODUODENOSCOPY (EGD) WITH PROPOFOL;  Surgeon: Dolores Frame, MD;  [facility];  Service: Gastroenterology;  Laterality: N/A;  9:45   I & D KNEE WITH POLY EXCHANGE Right 04/02/2016   Procedure:  IRRIGATION AND DEBRIDEMENT RIGHT KNEE WITH POLY EXCHANGE;  Surgeon: Dannielle Huh, MD;  Location: [facility];  Service: Orthopedics;  Laterality: Right;   INCISION AND DRAINAGE HEMATOMA POST LEFT  TOTAL KNEE  2012   INGUINAL HERNIA REPAIR Bilateral 05/22/2013   Procedure: REPAIR OF RECURRENT INCARCERATED INGUINAL HERNIA WITH MESH RIGHT SIDE,  REPAIR OF RECURRENT INGUINAL HERNIA WITH MESH LEFT SIDE;  Surgeon: Ernestene Mention, MD;  Location: [facility] OR;  Service: General;  Laterality: Bilateral;   INGUINAL HERNIA REPAIR Bilateral 11/1997   INSERTION OF MESH Bilateral 05/22/2013   Procedure: INSERTION OF MESH;  Surgeon: Ernestene Mention, MD;  Location: [facility];  Service: General;  Laterality: Bilateral;   IRRIGATION AND DEBRIDEMENT KNEE Right 02/22/2016   Procedure: IRRIGATION AND DEBRIDEMENT KNEE;  Surgeon: Dannielle Huh, MD;  Location: [facility] OR;  Service: Orthopedics;  Laterality: Right;   Irrigation and Debridement right knee  03/23/2009   Dr. Hyacinth Meeker,  [facility]   KNEE ARTHROSCOPY W/ SYNOVECTOMY Right 01/30/2010   AND DEBRIDEMENT OF HETEROTOPIC   LIVER BIOPSY  09/30/2012   REVISION TOTAL KNEE ARTHROPLASTY Left fall 2012   TONSILLECTOMY  child   TOTAL KNEE ARTHROPLASTY Bilateral right 09/ 2010/  left 09-18-2010   TRANSTHORACIC ECHOCARDIOGRAM  02/22/2016   ef 30-35% (per cardiac cath 02-24-2016 normal), severe hypokinesis of the mid-apicalanteroseptal and apical myocardium,  grade 1 diastolic dysfunction/ trivial PR and TR   TRIGGER FINGER RELEASE Left 02/11/2019   Procedure: LEFT WRIST STENOSING TENOSYNOVITIS RELEASE;  Surgeon: Dairl Ponder, MD;  Location: [facility] OR;  Service: Orthopedics;  Laterality: Left;  MAC WITH AXILLARY BLOCK   WRIST ARTHROPLASTY Left 10/17/2017   Procedure: CAPITATE RESURFACING ARTHROPLASTY;  Surgeon: Dairl Ponder, MD;  Location: [facility];  Service: Orthopedics;  Laterality: Left;    Family History family history includes Arthritis in his brother and father; Diabetes in his brother and paternal uncle; GI Bleed in his father; Heart attack in his maternal grandfather and maternal grandmother.  Social History Social History   Socioeconomic History   Marital status: Legally Separated    Spouse name: Not on file   Number of children: 2   Years of education: Not on file   Highest education level: 12th grade  Occupational History    Employer: [employer]    Comment: part time  Tobacco Use   Smoking status: Former    Current packs/day: 0.00    Average packs/day: 0.3 packs/day for 3.0 years (0.8 ttl pk-yrs)    Types: Cigarettes    Start date: 10/10/1971    Quit date: 10/10/1974    Years since quitting: 48.9    Passive exposure: Past   Smokeless tobacco: Never  Vaping Use   Vaping status: Never Used  Substance and Sexual Activity   Alcohol use: Not Currently   Drug use: No   Sexual activity: Yes  Other Topics Concern   Not on file  Social History Narrative   ** Merged History Encounter **        Pt lives alone.   Social Drivers of Corporate investment banker Strain: Low Risk  (11/16/2019)   Overall Financial Resource Strain (CARDIA)    Difficulty of Paying Living Expenses: Not hard at all  Food Insecurity: No Food Insecurity (02/28/2021)   Hunger Vital Sign    Worried About Running Out of Food in the Last Year: Never true    Ran Out of Food in the Last Year: Never true  Transportation Needs: No Transportation  Needs (02/28/2021)   PRAPARE - Administrator, Civil Service (Medical): No    Lack of Transportation (Non-Medical): No  Physical Activity: Inactive (11/16/2019)   Exercise Vital Sign    Days of Exercise per Week: 0 days    Minutes of Exercise per Session: 0 min  Stress: No Stress Concern Present (11/16/2019)   [employer] of Occupational Health - Occupational Stress Questionnaire    Feeling of Stress : Not at all  Social Connections: Moderately Isolated (11/16/2019)   Social Connection and Isolation Panel [NHANES]    Frequency of Communication with Friends and Family: More than three times a week    Frequency of Social Gatherings with Friends and Family: More than three times a week    Attends Religious Services: More than 4 times per year    Active Member of Clubs or Organizations: No    Attends Banker Meetings: Never    Marital Status: Separated  Intimate Partner Violence: Not At Risk (11/16/2019)   Humiliation, Afraid, Rape, and Kick questionnaire    Fear of Current or Ex-Partner: No    Emotionally Abused: No    Physically Abused: No    Sexually Abused: No    Laboratory Investigations Lab Results  Component Value Date   TSH 0.83 08/30/2023   TSH <0.005 (L) 04/27/2019   TSH <0.010 (L) 12/20/2018   FREET4 0.9 08/30/2023   FREET4 1.56 (H) 12/21/2018     No results found for: "TSI"   No components found for: "TRAB"   Lab Results  Component Value Date   CHOL 120 05/01/2018   Lab Results  Component Value Date   HDL 41  05/01/2018   Lab Results  Component Value Date   LDLCALC 59 05/01/2018   Lab Results  Component Value Date   TRIG 100 05/01/2018   Lab Results  Component Value Date   CHOLHDL 2.9 05/01/2018   Lab Results  Component Value Date   CREATININE 1.05 03/25/2023   No results found for: "GFR"    Component Value Date/Time   NA 137 03/25/2023 1430   NA 141 04/27/2019 1147   K 4.0 03/25/2023 1430   CL 102 03/25/2023 1430   CO2 25 03/25/2023 1430   GLUCOSE 134 (H) 03/25/2023 1430   BUN 12 03/25/2023 1430   BUN 10 04/27/2019 1147   CREATININE 1.05 03/25/2023 1430   CALCIUM 8.8 03/25/2023 1430   PROT 7.0 03/25/2023 1430   PROT 6.5 04/27/2019 1147   ALBUMIN 4.2 02/08/2020 1525   ALBUMIN 4.3 04/27/2019 1147   AST 24 03/25/2023 1430   ALT 22 03/25/2023 1430   ALKPHOS 108 02/08/2020 1525   BILITOT 1.3 (H) 03/25/2023 1430   BILITOT 1.6 (H) 04/27/2019 1147   GFRNONAA >60 02/08/2020 1525   GFRAA >60 02/08/2020 1525      Latest Ref Rng & Units 03/25/2023    2:30 PM 02/05/2022   11:06 AM 10/19/2021    1:54 PM  BMP  Glucose 65 - 99 mg/dL 409  91  811   BUN 7 - 25 mg/dL 12  10  12    Creatinine 0.70 - 1.35 mg/dL 9.14  7.82  9.56   BUN/Creat Ratio 6 - 22 (calc) SEE NOTE:  SEE NOTE:  NOT APPLICABLE   Sodium 135 - 146 mmol/L 137  141  141   Potassium 3.5 - 5.3 mmol/L 4.0  4.2  3.9   Chloride 98 - 110 mmol/L 102  104  107  CO2 20 - 32 mmol/L 25  28  27    Calcium 8.6 - 10.3 mg/dL 8.8  8.7  8.3        Component Value Date/Time   WBC 3.0 (L) 03/25/2023 1430   RBC 3.87 (L) 03/25/2023 1430   HGB 11.7 (L) 03/25/2023 1430   HGB 14.7 04/27/2019 1147   HCT 34.8 (L) 03/25/2023 1430   HCT 42.8 04/27/2019 1147   PLT 82 (L) 03/25/2023 1430   PLT 78 (LL) 04/27/2019 1147   MCV 89.9 03/25/2023 1430   MCV 89 04/27/2019 1147   MCH 30.2 03/25/2023 1430   MCHC 33.6 03/25/2023 1430   RDW 13.2 03/25/2023 1430   RDW 14.2 04/27/2019 1147   LYMPHSABS 285 (L) 03/25/2023 1430   MONOABS 0.2 02/08/2020  1525   EOSABS 336 03/25/2023 1430   BASOSABS 30 03/25/2023 1430      Parts of this note may have been dictated using voice recognition software. There may be variances in spelling and vocabulary which are unintentional. Not all errors are proofread. Please notify the Thereasa Parkin if any discrepancies are noted or if the meaning of any statement is not clear.

## 2023-09-03 NOTE — Patient Instructions (Signed)
  If you notice any symptoms of worsening fatigue, fever with sore throat, loss of appetite, yellowing of eyes, dark urine, joint pains, sores in the mouth, itchy rash, light colored stools or abdominal pain, please stop the medication and call us immediately as this can be a serious side effect of the medication.

## 2023-09-04 LAB — T3, FREE: T3, Free: 3.5 pg/mL (ref 2.3–4.2)

## 2023-09-04 LAB — T4, FREE: Free T4: 0.9 ng/dL (ref 0.8–1.8)

## 2023-09-04 LAB — THYROID STIMULATING IMMUNOGLOBULIN: TSI: 166 %{baseline} — ABNORMAL HIGH (ref <140)

## 2023-09-04 LAB — TRAB (TSH RECEPTOR BINDING ANTIBODY): TRAB: 19.39 IU/L — ABNORMAL HIGH (ref <=2.00)

## 2023-09-04 LAB — TSH: TSH: 0.83 m[IU]/L (ref 0.40–4.50)

## 2023-09-09 ENCOUNTER — Ambulatory Visit (HOSPITAL_COMMUNITY): Payer: HMO

## 2023-09-12 ENCOUNTER — Ambulatory Visit (HOSPITAL_COMMUNITY)
Admission: RE | Admit: 2023-09-12 | Discharge: 2023-09-12 | Disposition: A | Discharge status: Home / Self Care | Source: Ambulatory Visit | Attending: "Endocrinology | Admitting: "Endocrinology

## 2023-09-12 ENCOUNTER — Ambulatory Visit (HOSPITAL_COMMUNITY)
Admission: RE | Admit: 2023-09-12 | Discharge: 2023-09-12 | Disposition: A | Discharge status: Home / Self Care | Source: Ambulatory Visit | Attending: Gastroenterology | Admitting: Gastroenterology

## 2023-09-12 DIAGNOSIS — K7581 Nonalcoholic steatohepatitis (NASH): Secondary | ICD-10-CM | POA: Insufficient documentation

## 2023-09-12 DIAGNOSIS — E041 Nontoxic single thyroid nodule: Secondary | ICD-10-CM | POA: Diagnosis not present

## 2023-09-12 DIAGNOSIS — E0500 Thyrotoxicosis with diffuse goiter without thyrotoxic crisis or storm: Secondary | ICD-10-CM

## 2023-09-12 DIAGNOSIS — K8020 Calculus of gallbladder without cholecystitis without obstruction: Secondary | ICD-10-CM | POA: Diagnosis not present

## 2023-09-12 DIAGNOSIS — K7460 Unspecified cirrhosis of liver: Secondary | ICD-10-CM | POA: Insufficient documentation

## 2023-09-23 ENCOUNTER — Encounter (INDEPENDENT_AMBULATORY_CARE_PROVIDER_SITE_OTHER): Payer: Self-pay

## 2023-09-23 ENCOUNTER — Ambulatory Visit (INDEPENDENT_AMBULATORY_CARE_PROVIDER_SITE_OTHER): Payer: PPO | Admitting: Gastroenterology

## 2023-09-23 ENCOUNTER — Encounter (INDEPENDENT_AMBULATORY_CARE_PROVIDER_SITE_OTHER): Payer: Self-pay | Admitting: Gastroenterology

## 2023-09-23 VITALS — BP 129/66 | HR 83 | Temp 97.5°F | Ht 72.0 in | Wt 205.7 lb

## 2023-09-23 DIAGNOSIS — I8510 Secondary esophageal varices without bleeding: Secondary | ICD-10-CM

## 2023-09-23 DIAGNOSIS — K7460 Unspecified cirrhosis of liver: Secondary | ICD-10-CM | POA: Diagnosis not present

## 2023-09-23 DIAGNOSIS — K7581 Nonalcoholic steatohepatitis (NASH): Secondary | ICD-10-CM | POA: Diagnosis not present

## 2023-09-23 DIAGNOSIS — I864 Gastric varices: Secondary | ICD-10-CM | POA: Diagnosis not present

## 2023-09-23 DIAGNOSIS — K219 Gastro-esophageal reflux disease without esophagitis: Secondary | ICD-10-CM

## 2023-09-23 NOTE — Progress Notes (Unsigned)
 Referring Provider: Benita Stabile, MD Primary Care Physician:  Benita Stabile, MD Primary GI Physician: Dr. Levon Hedger   Chief Complaint  Patient presents with   Follow-up    Patient here today for a follow up on Cirrhosis. Patient denies any current gi issues. He does report to feel tired a lot.    HPI:   LEDARIUS LEESON is a 68 y.o. male with past medical history of NASH cirrhosis complicated by grade 3 esophgeal varices, Type II GOV varices, GERD complicated by Barrett's esophagus without dysplasia, hx of intraoperative cardiac arrest   Patient presenting today for:  Follow up of Cirrhosis  Last seen September 2024, at that time, doing well, denied any GI complaints   Recommended to check CBC, Hep B/A, MELD labs, AFP, continue lasix 40/spironolactone 200mg , continue PPI daily, pt to let us know if he wishes to pursue TIPS  Present:  He denies abdominal pain. Notes that he feels that his abdomen bloats when he eats, this usually goes back down after a little. Denies swelling to his legs, notes that he has no swelling to abdomen except when he eats. No changes in appetite or weight loss. No rectal bleeding or melena. No episodes of confusion. Reprots compliance with his diuretics.   He has started his hep B immunizations with PCP, has upcoming dose with them at the end of the month.     Cirrhosis related questions: Hematemesis/coffee ground emesis: No Abdominal pain: No Abdominal distention/worsening ascitesNo Fever/chills: No Episodes of confusion/disorientation: No Number of daily bowel movements:at least 3 bowel movements per day Taking diuretics?: Yes, furosemide 80mg  and spironolactone 200 mg qday History of variceal bleeding: No Prior history of banding?: No Prior episodes of SBP: No Last time liver imaging was performed:09/12/23 no lesions seen  Last AFP: 08/29/22 - 2.2 MELD 3.0 score: 03/2023  9 Currently consuming alcohol: No Hepatitis A-immune Hep B not immune   Last  Colonoscopy:2021- Two small polyps in the sigmoid colon and in the ascending colon, removed with a cold snare. Resected and retrieved. - External hemorrhoids. - Biopsies were taken with a cold forceps from the ascending colon and sigmoid colon for evaluation of microscopic colitis-Negative    Last Endoscopy: 11/04/20 grade III esophageal varices, Type 2 gastroesophageal varices (GOV 2 esophageal varices which extend along the fundus) w/o bleeding, single gastric polyp   Repeat Colonoscopy in 2026   Filed Weights   09/23/23 1517  Weight: 205 lb 11.2 oz (93.3 kg)     Past Medical History:  Diagnosis Date   Acute systolic (congestive) heart failure (HCC) 04/30/2018   Arthritis    knees, wrists   Barrett's esophagus    Cardiac arrest (HCC) 02/22/2016   Complication of anesthesia    02-22-2016 intraop cardiac arrest immediately after vancomyocin administration , pt cardiac arrest w/ Vfib,  please refer to anesthesia record in epic   Dysrhythmia    Afib 12/2018 - converted to NSR   Esophageal varices determined by endoscopy (HCC) 11/2016   grade 1   GERD (gastroesophageal reflux disease)    Hiatal hernia    History of adenomatous polyp of colon    History of asthma    as child   History of DVT (deep vein thrombosis)    right lower extremitty post-op right total knee surgery 09/ 2010,  treated w/ coumadin   History of esophageal dilatation 07/1998   for schatzski ring   History of kidney stones    History of  staph infection 04/2016   MSSA infection of right total knee w/ sepsis   Hx of cardiac arrest    02-22-2016  intraoperative, immediately following moving pt into prone position and vancomyocin administration, cardiac arrest w/ Vfib (referred to anesthesia record in epic) pt extubated himself next day   Infection of prosthetic right knee joint (HCC)    Liver cirrhosis secondary to NASH (nonalcoholic steatohepatitis) (HCC)    NAFLD-- followed by dr Karilyn Cota---  liver bx 09-30-2012   mild portal and focal sinusoidal fibrosis   Pre-diabetes    Pyogenic arthritis of right knee joint (HCC) 04/02/2016   Scapholunate advanced collapse of left wrist    deformity   Shock circulatory (HCC)    Staphylococcus aureus bacteremia with sepsis (HCC)    Thrombocytopenia (HCC)    10-09-2017 per pt his platelets have always been low , followed by pcp, never been referred to hematologist    Past Surgical History:  Procedure Laterality Date   APPLICATION OF WOUND VAC Right 02/22/2016   Procedure: APPLICATION OF WOUND VAC;  Surgeon: Dannielle Huh, MD;  Location: [facility] OR;  Service: Orthopedics;  Laterality: Right;   BIOPSY  02/10/2020   Procedure: BIOPSY;  Surgeon: Malissa Hippo, MD;  [facility]: [facility];  Service: Endoscopy;;   CARDIAC CATHETERIZATION N/A 02/24/2016   Procedure: Left Heart Cath and Coronary Angiography;  Surgeon: Kathleene Hazel, MD;  Location: [facility] INVASIVE CV LAB;  Service: Cardiovascular;  Laterality: N/A;  No angiographic evidence of CAD,  normal LVSF, ef 50-55%   CARPECTOMY Left 10/17/2017   Procedure: LEFT WRIST PROXIMAL ROW CARPECTOMY WITH POSTEROR IMBROSSEOUS NERVE EXCISION;  Surgeon: Dairl Ponder, MD;  Location: [facility];  Service: Orthopedics;  Laterality: Left;  AXILLARY BLOCK   CARPECTOMY WITH RADIAL STYLOIDECTOMY Left 02/11/2019   Procedure: LEFT WRIST RADIAL STYLOIDECTOMY;  Surgeon: Dairl Ponder, MD;  Location: [facility] OR;  Service: Orthopedics;  Laterality: Left;   COLONOSCOPY     COLONOSCOPY WITH PROPOFOL N/A 02/10/2020   Procedure: COLONOSCOPY WITH PROPOFOL;  Surgeon: Malissa Hippo, MD;  [facility];  Service: Endoscopy;  Laterality: N/A;  955   EAR CYST EXCISION Right 09/06/2014   Procedure: OPEN EXCISION BAKER'S CYST RIGHT KNEE;  Surgeon: Dannielle Huh, MD;  Location: [facility];  Service: Orthopedics;  Laterality: Right;   ESOPHAGEAL BANDING  06/30/2020   Procedure: ESOPHAGEAL BANDING;  Surgeon: Malissa Hippo, MD;   [facility];  Service: Endoscopy;;   ESOPHAGEAL DILATION  1996/ 2000   ESOPHAGOGASTRODUODENOSCOPY N/A 09/17/2012   Procedure: ESOPHAGOGASTRODUODENOSCOPY (EGD);  Surgeon: Malissa Hippo, MD;  [facility];  Service: Endoscopy;  Laterality: N/A;  200   ESOPHAGOGASTRODUODENOSCOPY N/A 12/15/2015   Procedure: ESOPHAGOGASTRODUODENOSCOPY (EGD);  Surgeon: Malissa Hippo, MD;  [facility];  Service: Endoscopy;  Laterality: N/A;  1245   ESOPHAGOGASTRODUODENOSCOPY N/A 12/17/2016   Procedure: ESOPHAGOGASTRODUODENOSCOPY (EGD);  Surgeon: Malissa Hippo, MD;  [facility];  Service: Endoscopy;  Laterality: N/A;  210   ESOPHAGOGASTRODUODENOSCOPY N/A 06/30/2020   Procedure: ESOPHAGOGASTRODUODENOSCOPY (EGD);  Surgeon: Malissa Hippo, MD;  [facility];  Service: Endoscopy;  Laterality: N/A;  11:15   ESOPHAGOGASTRODUODENOSCOPY (EGD) WITH PROPOFOL N/A 02/10/2020   Procedure: ESOPHAGOGASTRODUODENOSCOPY (EGD) WITH PROPOFOL;  Surgeon: Malissa Hippo, MD;  [facility];  Service: Endoscopy;  Laterality: N/A;   ESOPHAGOGASTRODUODENOSCOPY (EGD) WITH PROPOFOL N/A 11/04/2020   Procedure: ESOPHAGOGASTRODUODENOSCOPY (EGD) WITH PROPOFOL;  Surgeon: Dolores Frame,  MD;  [facility];  Service: Gastroenterology;  Laterality: N/A;  9:45   I & D KNEE WITH POLY EXCHANGE Right 04/02/2016   Procedure: IRRIGATION AND DEBRIDEMENT RIGHT KNEE WITH POLY EXCHANGE;  Surgeon: Dannielle Huh, MD;  Location: [facility];  Service: Orthopedics;  Laterality: Right;   INCISION AND DRAINAGE HEMATOMA POST LEFT  TOTAL KNEE  2012   INGUINAL HERNIA REPAIR Bilateral 05/22/2013   Procedure: REPAIR OF RECURRENT INCARCERATED INGUINAL HERNIA WITH MESH RIGHT SIDE,  REPAIR OF RECURRENT INGUINAL HERNIA WITH MESH LEFT SIDE;  Surgeon: Ernestene Mention, MD;  Location: [facility];  Service: General;  Laterality: Bilateral;   INGUINAL HERNIA REPAIR Bilateral 11/1997   INSERTION OF MESH  Bilateral 05/22/2013   Procedure: INSERTION OF MESH;  Surgeon: Ernestene Mention, MD;  Location: [facility];  Service: General;  Laterality: Bilateral;   IRRIGATION AND DEBRIDEMENT KNEE Right 02/22/2016   Procedure: IRRIGATION AND DEBRIDEMENT KNEE;  Surgeon: Dannielle Huh, MD;  Location: [facility];  Service: Orthopedics;  Laterality: Right;   Irrigation and Debridement right knee  03/23/2009   Dr. Hyacinth Meeker, [facility]   KNEE ARTHROSCOPY W/ SYNOVECTOMY Right 01/30/2010   AND DEBRIDEMENT OF HETEROTOPIC   LIVER BIOPSY  09/30/2012   REVISION TOTAL KNEE ARTHROPLASTY Left fall 2012   TONSILLECTOMY  child   TOTAL KNEE ARTHROPLASTY Bilateral right 09/ 2010/  left 09-18-2010   TRANSTHORACIC ECHOCARDIOGRAM  02/22/2016   ef 30-35% (per cardiac cath 02-24-2016 normal), severe hypokinesis of the mid-apicalanteroseptal and apical myocardium,  grade 1 diastolic dysfunction/ trivial PR and TR   TRIGGER FINGER RELEASE Left 02/11/2019   Procedure: LEFT WRIST STENOSING TENOSYNOVITIS RELEASE;  Surgeon: Dairl Ponder, MD;  Location: [facility] OR;  Service: Orthopedics;  Laterality: Left;  MAC WITH AXILLARY BLOCK   WRIST ARTHROPLASTY Left 10/17/2017   Procedure: CAPITATE RESURFACING ARTHROPLASTY;  Surgeon: Dairl Ponder, MD;  Location[facility];  Service: Orthopedics;  Laterality: Left;    Current Outpatient Medications  Medication Sig Dispense Refill   EPINEPHrine (PRIMATENE MIST) 0.125 MG/ACT AERO Inhale 1 puff into the lungs daily as needed (asthma).     furosemide (LASIX) 40 MG tablet Take 2 tablets by mouth once daily 180 tablet 0   glipiZIDE (GLUCOTROL XL) 2.5 MG 24 hr tablet Take 1 tablet (2.5 mg total) by mouth daily with breakfast. Please schedule office visit before any future refills (Patient taking differently: Take 10 mg by mouth daily with breakfast. Please schedule office visit before any future refills) 30 tablet 0   methimazole (TAPAZOLE) 10 MG tablet Take 1 tablet (10 mg total) by mouth daily. 90  tablet 1   pantoprazole (PROTONIX) 40 MG tablet Take 1 tablet (40 mg total) by mouth daily before breakfast. 90 tablet 3   sildenafil (VIAGRA) 100 MG tablet Take 100 mg by mouth daily as needed for erectile dysfunction.     spironolactone (ALDACTONE) 100 MG tablet Take 2 tablets by mouth once daily 180 tablet 1   No current facility-administered medications for this visit.    Allergies as of 09/23/2023 - Review Complete 09/23/2023  Allergen Reaction Noted   Asa [aspirin] Shortness Of Breath 04/01/2016   Penicillins Shortness Of Breath 07/04/2012   Vancomycin Anaphylaxis 02/22/2016    Social History   Socioeconomic History   Marital status: Legally Separated    Spouse name: Not on file   Number of children: 2   Years of education: Not on file   Highest education level: 12th grade  Occupational  History    Employer: [employer]    Comment: part time  Tobacco Use   Smoking status: Former    Current packs/day: 0.00    Average packs/day: 0.3 packs/day for 3.0 years (0.8 ttl pk-yrs)    Types: Cigarettes    Start date: 10/10/1971    Quit date: 10/10/1974    Years since quitting: 48.9    Passive exposure: Past   Smokeless tobacco: Never  Vaping Use   Vaping status: Never Used  Substance and Sexual Activity   Alcohol use: Not Currently   Drug use: No   Sexual activity: Yes  Other Topics Concern   Not on file  Social History Narrative   ** Merged History Encounter **       Pt lives alone.   Social Drivers of Corporate investment banker Strain: Low Risk  (11/16/2019)   Overall Financial Resource Strain (CARDIA)    Difficulty of Paying Living Expenses: Not hard at all  Food Insecurity: No Food Insecurity (02/28/2021)   Hunger Vital Sign    Worried About Running Out of Food in the Last Year: Never true    Ran Out of Food in the Last Year: Never true  Transportation Needs: No Transportation Needs (02/28/2021)   PRAPARE - Administrator, Civil Service (Medical): No     Lack of Transportation (Non-Medical): No  Physical Activity: Inactive (11/16/2019)   Exercise Vital Sign    Days of Exercise per Week: 0 days    Minutes of Exercise per Session: 0 min  Stress: No Stress Concern Present (11/16/2019)   [employer] of Occupational Health - Occupational Stress Questionnaire    Feeling of Stress : Not at all  Social Connections: Moderately Isolated (11/16/2019)   Social Connection and Isolation Panel [NHANES]    Frequency of Communication with Friends and Family: More than three times a week    Frequency of Social Gatherings with Friends and Family: More than three times a week    Attends Religious Services: More than 4 times per year    Active Member of [employer] or Organizations: No    Attends Engineer, structural: Never    Marital Status: Separated    Review of systems General: negative for malaise, night sweats, fever, chills, weight los Neck: Negative for lumps, goiter, pain and significant neck swelling Resp: Negative for cough, wheezing, dyspnea at rest CV: Negative for chest pain, leg swelling, palpitations, orthopnea GI: denies melena, hematochezia, nausea, vomiting, diarrhea, constipation, dysphagia, odyonophagia, early satiety or unintentional weight loss.  MSK: Negative for joint pain or swelling, back pain, and muscle pain. Derm: Negative for itching or rash Psych: Denies depression, anxiety, memory loss, confusion. No homicidal or suicidal ideation.  Heme: Negative for prolonged bleeding, bruising easily, and swollen nodes. Endocrine: Negative for cold or heat intolerance, polyuria, polydipsia and goiter. Neuro: negative for tremor, gait imbalance, syncope and seizures. The remainder of the review of systems is noncontributory.  Physical Exam: BP 129/66 (BP Location: Left Arm, Patient Position: Sitting, Cuff Size: Normal)   Pulse 83   Temp (!) 97.5 F (36.4 C) (Temporal)   Ht 6' (1.829 m)   Wt 205 lb 11.2 oz (93.3 kg)   BMI  27.90 kg/m  General:   Alert and oriented. No distress noted. Pleasant and cooperative.  Head:  Normocephalic and atraumatic. Eyes:  Conjuctiva clear without scleral icterus. Mouth:  Oral mucosa pink and moist. Good dentition. No lesions. Heart: Normal rate and rhythm,  s1 and s2 heart sounds present.  Lungs: Clear lung sounds in all lobes. Respirations equal and unlabored. Abdomen:  +BS, soft, non-tender and non-distended. No rebound or guarding. No HSM or masses noted. Derm: No palmar erythema or jaundice Msk:  Symmetrical without gross deformities. Normal posture. Extremities:  Without edema. Neurologic:  Alert and  oriented x4 Psych:  Alert and cooperative. Normal mood and affect.  Invalid input(s): "6 MONTHS"   ASSESSMENT: NORFLEET CAPERS is a 68 y.o. male presenting today   Cirrhosis with EVs:  GERD:   PLAN:  -update CBC, CMP, INR, AFP -continue lasix 40/spironolactone 200mg  daily    Follow Up: 6 months   Elliet Goodnow L. Jeanmarie Hubert, MSN, APRN, AGNP-C Adult-Gerontology Nurse Practitioner [facility]

## 2023-09-23 NOTE — Patient Instructions (Addendum)
-  We will update labs in regards to your liver -For now continue with lasix 80mg  and spironolactone 200mg , I may increase these after labs have resulted -Continue protonix 40mg  daily - Reduce salt intake to <2 g per day - Can take Tylenol max of 2 g per day (650 mg q8h) for pain - Avoid NSAIDs for pain - Avoid eating raw oysters/shellfish - Ensure every night before going to sleep -please let me know if you reconsider TIPS evaluation  Follow up 6 months  It was a pleasure to see you today. I want to create trusting relationships with patients and provide genuine, compassionate, and quality care. I truly value your feedback! please be on the lookout for a survey regarding your visit with me today. I appreciate your input about our visit and your time in completing this!    Lannette Avellino L. Jeanmarie Hubert, MSN, APRN, AGNP-C Adult-Gerontology Nurse Practitioner [facility]

## 2023-09-23 NOTE — H&P (View-Only) (Signed)
 Referring Provider: Benita Stabile, MD Primary Care Physician:  Benita Stabile, MD Primary GI Physician: Dr. Levon Hedger   Chief Complaint  Patient presents with   Follow-up    Patient here today for a follow up on Cirrhosis. Patient denies any current gi issues. He does report to feel tired a lot.    HPI:   Stephen Burch is a 68 y.o. male with past medical history of NASH cirrhosis complicated by grade 3 esophgeal varices, Type II GOV varices, GERD complicated by Barrett's esophagus without dysplasia, hx of intraoperative cardiac arrest   Patient presenting today for:  Follow up of Cirrhosis  Last seen September 2024, at that time, doing well, denied any GI complaints   Recommended to check CBC, Hep B/A, MELD labs, AFP, continue lasix 40/spironolactone 200mg , continue PPI daily, pt to let us know if he wishes to pursue TIPS  Present:  He denies abdominal pain. Notes that he feels that his abdomen bloats when he eats, this usually goes back down after a little. Denies swelling to his legs, notes that he has no swelling to abdomen except when he eats. No changes in appetite or weight loss. No rectal bleeding or melena. No episodes of confusion. Reprots compliance with his diuretics.   He has started his hep B immunizations with PCP, has upcoming dose with them at the end of the month.     Cirrhosis related questions: Hematemesis/coffee ground emesis: No Abdominal pain: No Abdominal distention/worsening ascitesNo Fever/chills: No Episodes of confusion/disorientation: No Number of daily bowel movements:at least 3 bowel movements per day Taking diuretics?: Yes, furosemide 80mg  and spironolactone 200 mg qday History of variceal bleeding: No Prior history of banding?: No Prior episodes of SBP: No Last time liver imaging was performed:09/12/23 no lesions seen  Last AFP: 08/29/22 - 2.2 MELD 3.0 score: 03/2023  9 Currently consuming alcohol: No Hepatitis A-immune Hep B not immune   Last  Colonoscopy:2021- Two small polyps in the sigmoid colon and in the ascending colon, removed with a cold snare. Resected and retrieved. - External hemorrhoids. - Biopsies were taken with a cold forceps from the ascending colon and sigmoid colon for evaluation of microscopic colitis-Negative    Last Endoscopy: 11/04/20 grade III esophageal varices, Type 2 gastroesophageal varices (GOV 2 esophageal varices which extend along the fundus) w/o bleeding, single gastric polyp   Repeat Colonoscopy in 2026   Filed Weights   09/23/23 1517  Weight: 205 lb 11.2 oz (93.3 kg)     Past Medical History:  Diagnosis Date   Acute systolic (congestive) heart failure (HCC) 04/30/2018   Arthritis    knees, wrists   Barrett's esophagus    Cardiac arrest (HCC) 02/22/2016   Complication of anesthesia    02-22-2016 intraop cardiac arrest immediately after vancomyocin administration , pt cardiac arrest w/ Vfib,  please refer to anesthesia record in epic   Dysrhythmia    Afib 12/2018 - converted to NSR   Esophageal varices determined by endoscopy (HCC) 11/2016   grade 1   GERD (gastroesophageal reflux disease)    Hiatal hernia    History of adenomatous polyp of colon    History of asthma    as child   History of DVT (deep vein thrombosis)    right lower extremitty post-op right total knee surgery 09/ 2010,  treated w/ coumadin   History of esophageal dilatation 07/1998   for schatzski ring   History of kidney stones    History of  staph infection 04/2016   MSSA infection of right total knee w/ sepsis   Hx of cardiac arrest    02-22-2016  intraoperative, immediately following moving pt into prone position and vancomyocin administration, cardiac arrest w/ Vfib (referred to anesthesia record in epic) pt extubated himself next day   Infection of prosthetic right knee joint (HCC)    Liver cirrhosis secondary to NASH (nonalcoholic steatohepatitis) (HCC)    NAFLD-- followed by dr Karilyn Cota---  liver bx 09-30-2012   mild portal and focal sinusoidal fibrosis   Pre-diabetes    Pyogenic arthritis of right knee joint (HCC) 04/02/2016   Scapholunate advanced collapse of left wrist    deformity   Shock circulatory (HCC)    Staphylococcus aureus bacteremia with sepsis (HCC)    Thrombocytopenia (HCC)    10-09-2017 per pt his platelets have always been low , followed by pcp, never been referred to hematologist    Past Surgical History:  Procedure Laterality Date   APPLICATION OF WOUND VAC Right 02/22/2016   Procedure: APPLICATION OF WOUND VAC;  Surgeon: Dannielle Huh, MD;  Location: [facility] OR;  Service: Orthopedics;  Laterality: Right;   BIOPSY  02/10/2020   Procedure: BIOPSY;  Surgeon: Malissa Hippo, MD;  [facility];  Service: Endoscopy;;   CARDIAC CATHETERIZATION N/A 02/24/2016   Procedure: Left Heart Cath and Coronary Angiography;  Surgeon: Kathleene Hazel, MD;  Location: [facility] INVASIVE CV LAB;  Service: Cardiovascular;  Laterality: N/A;  No angiographic evidence of CAD,  normal LVSF, ef 50-55%   CARPECTOMY Left 10/17/2017   Procedure: LEFT WRIST PROXIMAL ROW CARPECTOMY WITH POSTEROR IMBROSSEOUS NERVE EXCISION;  Surgeon: Dairl Ponder, MD;  Location: [facility];  Service: Orthopedics;  Laterality: Left;  AXILLARY BLOCK   CARPECTOMY WITH RADIAL STYLOIDECTOMY Left 02/11/2019   Procedure: LEFT WRIST RADIAL STYLOIDECTOMY;  Surgeon: Dairl Ponder, MD;  Location: [facility] OR;  Service: Orthopedics;  Laterality: Left;   COLONOSCOPY     COLONOSCOPY WITH PROPOFOL N/A 02/10/2020   Procedure: COLONOSCOPY WITH PROPOFOL;  Surgeon: Malissa Hippo, MD;  [facility];  Service: Endoscopy;  Laterality: N/A;  955   EAR CYST EXCISION Right 09/06/2014   Procedure: OPEN EXCISION BAKER'S CYST RIGHT KNEE;  Surgeon: Dannielle Huh, MD;  Location: [facility];  Service: Orthopedics;  Laterality: Right;   ESOPHAGEAL BANDING  06/30/2020   Procedure: ESOPHAGEAL BANDING;  Surgeon: Malissa Hippo, MD;   [facility];  Service: Endoscopy;;   ESOPHAGEAL DILATION  1996/ 2000   ESOPHAGOGASTRODUODENOSCOPY N/A 09/17/2012   Procedure: ESOPHAGOGASTRODUODENOSCOPY (EGD);  Surgeon: Malissa Hippo, MD;  [facility];  Service: Endoscopy;  Laterality: N/A;  200   ESOPHAGOGASTRODUODENOSCOPY N/A 12/15/2015   Procedure: ESOPHAGOGASTRODUODENOSCOPY (EGD);  Surgeon: Malissa Hippo, MD;  [facility];  Service: Endoscopy;  Laterality: N/A;  1245   ESOPHAGOGASTRODUODENOSCOPY N/A 12/17/2016   Procedure: ESOPHAGOGASTRODUODENOSCOPY (EGD);  Surgeon: Malissa Hippo, MD;  [facility];  Service: Endoscopy;  Laterality: N/A;  210   ESOPHAGOGASTRODUODENOSCOPY N/A 06/30/2020   Procedure: ESOPHAGOGASTRODUODENOSCOPY (EGD);  Surgeon: Malissa Hippo, MD;  [facility];  Service: Endoscopy;  Laterality: N/A;  11:15   ESOPHAGOGASTRODUODENOSCOPY (EGD) WITH PROPOFOL N/A 02/10/2020   Procedure: ESOPHAGOGASTRODUODENOSCOPY (EGD) WITH PROPOFOL;  Surgeon: Malissa Hippo, MD;  [facility];  Service: Endoscopy;  Laterality: N/A;   ESOPHAGOGASTRODUODENOSCOPY (EGD) WITH PROPOFOL N/A 11/04/2020   Procedure: ESOPHAGOGASTRODUODENOSCOPY (EGD) WITH PROPOFOL;  Surgeon: Dolores Frame,  MD;  [facility];  Service: Gastroenterology;  Laterality: N/A;  9:45   I & D KNEE WITH POLY EXCHANGE Right 04/02/2016   Procedure: IRRIGATION AND DEBRIDEMENT RIGHT KNEE WITH POLY EXCHANGE;  Surgeon: Dannielle Huh, MD;  Location: [facility];  Service: Orthopedics;  Laterality: Right;   INCISION AND DRAINAGE HEMATOMA POST LEFT  TOTAL KNEE  2012   INGUINAL HERNIA REPAIR Bilateral 05/22/2013   Procedure: REPAIR OF RECURRENT INCARCERATED INGUINAL HERNIA WITH MESH RIGHT SIDE,  REPAIR OF RECURRENT INGUINAL HERNIA WITH MESH LEFT SIDE;  Surgeon: Ernestene Mention, MD;  Location: [facility];  Service: General;  Laterality: Bilateral;   INGUINAL HERNIA REPAIR Bilateral 11/1997   INSERTION OF MESH  Bilateral 05/22/2013   Procedure: INSERTION OF MESH;  Surgeon: Ernestene Mention, MD;  Location: [facility];  Service: General;  Laterality: Bilateral;   IRRIGATION AND DEBRIDEMENT KNEE Right 02/22/2016   Procedure: IRRIGATION AND DEBRIDEMENT KNEE;  Surgeon: Dannielle Huh, MD;  Location: [facility];  Service: Orthopedics;  Laterality: Right;   Irrigation and Debridement right knee  03/23/2009   Dr. Hyacinth Meeker, [facility]   KNEE ARTHROSCOPY W/ SYNOVECTOMY Right 01/30/2010   AND DEBRIDEMENT OF HETEROTOPIC   LIVER BIOPSY  09/30/2012   REVISION TOTAL KNEE ARTHROPLASTY Left fall 2012   TONSILLECTOMY  child   TOTAL KNEE ARTHROPLASTY Bilateral right 09/ 2010/  left 09-18-2010   TRANSTHORACIC ECHOCARDIOGRAM  02/22/2016   ef 30-35% (per cardiac cath 02-24-2016 normal), severe hypokinesis of the mid-apicalanteroseptal and apical myocardium,  grade 1 diastolic dysfunction/ trivial PR and TR   TRIGGER FINGER RELEASE Left 02/11/2019   Procedure: LEFT WRIST STENOSING TENOSYNOVITIS RELEASE;  Surgeon: Dairl Ponder, MD;  Location: [facility] OR;  Service: Orthopedics;  Laterality: Left;  MAC WITH AXILLARY BLOCK   WRIST ARTHROPLASTY Left 10/17/2017   Procedure: CAPITATE RESURFACING ARTHROPLASTY;  Surgeon: Dairl Ponder, MD;  Location[facility];  Service: Orthopedics;  Laterality: Left;    Current Outpatient Medications  Medication Sig Dispense Refill   EPINEPHrine (PRIMATENE MIST) 0.125 MG/ACT AERO Inhale 1 puff into the lungs daily as needed (asthma).     furosemide (LASIX) 40 MG tablet Take 2 tablets by mouth once daily 180 tablet 0   glipiZIDE (GLUCOTROL XL) 2.5 MG 24 hr tablet Take 1 tablet (2.5 mg total) by mouth daily with breakfast. Please schedule office visit before any future refills (Patient taking differently: Take 10 mg by mouth daily with breakfast. Please schedule office visit before any future refills) 30 tablet 0   methimazole (TAPAZOLE) 10 MG tablet Take 1 tablet (10 mg total) by mouth daily. 90  tablet 1   pantoprazole (PROTONIX) 40 MG tablet Take 1 tablet (40 mg total) by mouth daily before breakfast. 90 tablet 3   sildenafil (VIAGRA) 100 MG tablet Take 100 mg by mouth daily as needed for erectile dysfunction.     spironolactone (ALDACTONE) 100 MG tablet Take 2 tablets by mouth once daily 180 tablet 1   No current facility-administered medications for this visit.    Allergies as of 09/23/2023 - Review Complete 09/23/2023  Allergen Reaction Noted   Asa [aspirin] Shortness Of Breath 04/01/2016   Penicillins Shortness Of Breath 07/04/2012   Vancomycin Anaphylaxis 02/22/2016    Social History   Socioeconomic History   Marital status: Legally Separated    Spouse name: Not on file   Number of children: 2   Years of education: Not on file   Highest education level: 12th grade  Occupational  History    Employer: [employer]    Comment: part time  Tobacco Use   Smoking status: Former    Current packs/day: 0.00    Average packs/day: 0.3 packs/day for 3.0 years (0.8 ttl pk-yrs)    Types: Cigarettes    Start date: 10/10/1971    Quit date: 10/10/1974    Years since quitting: 48.9    Passive exposure: Past   Smokeless tobacco: Never  Vaping Use   Vaping status: Never Used  Substance and Sexual Activity   Alcohol use: Not Currently   Drug use: No   Sexual activity: Yes  Other Topics Concern   Not on file  Social History Narrative   ** Merged History Encounter **       Pt lives alone.   Social Drivers of Corporate investment banker Strain: Low Risk  (11/16/2019)   Overall Financial Resource Strain (CARDIA)    Difficulty of Paying Living Expenses: Not hard at all  Food Insecurity: No Food Insecurity (02/28/2021)   Hunger Vital Sign    Worried About Running Out of Food in the Last Year: Never true    Ran Out of Food in the Last Year: Never true  Transportation Needs: No Transportation Needs (02/28/2021)   PRAPARE - Administrator, Civil Service (Medical): No     Lack of Transportation (Non-Medical): No  Physical Activity: Inactive (11/16/2019)   Exercise Vital Sign    Days of Exercise per Week: 0 days    Minutes of Exercise per Session: 0 min  Stress: No Stress Concern Present (11/16/2019)   [employer] of Occupational Health - Occupational Stress Questionnaire    Feeling of Stress : Not at all  Social Connections: Moderately Isolated (11/16/2019)   Social Connection and Isolation Panel [NHANES]    Frequency of Communication with Friends and Family: More than three times a week    Frequency of Social Gatherings with Friends and Family: More than three times a week    Attends Religious Services: More than 4 times per year    Active Member of [employer] or Organizations: No    Attends Engineer, structural: Never    Marital Status: Separated    Review of systems General: negative for malaise, night sweats, fever, chills, weight los Neck: Negative for lumps, goiter, pain and significant neck swelling Resp: Negative for cough, wheezing, dyspnea at rest CV: Negative for chest pain, leg swelling, palpitations, orthopnea GI: denies melena, hematochezia, nausea, vomiting, diarrhea, constipation, dysphagia, odyonophagia, early satiety or unintentional weight loss.  MSK: Negative for joint pain or swelling, back pain, and muscle pain. Derm: Negative for itching or rash Psych: Denies depression, anxiety, memory loss, confusion. No homicidal or suicidal ideation.  Heme: Negative for prolonged bleeding, bruising easily, and swollen nodes. Endocrine: Negative for cold or heat intolerance, polyuria, polydipsia and goiter. Neuro: negative for tremor, gait imbalance, syncope and seizures. The remainder of the review of systems is noncontributory.  Physical Exam: BP 129/66 (BP Location: Left Arm, Patient Position: Sitting, Cuff Size: Normal)   Pulse 83   Temp (!) 97.5 F (36.4 C) (Temporal)   Ht 6' (1.829 m)   Wt 205 lb 11.2 oz (93.3 kg)   BMI  27.90 kg/m  General:   Alert and oriented. No distress noted. Pleasant and cooperative.  Head:  Normocephalic and atraumatic. Eyes:  Conjuctiva clear without scleral icterus. Mouth:  Oral mucosa pink and moist. Good dentition. No lesions. Heart: Normal rate and rhythm,  s1 and s2 heart sounds present.  Lungs: Clear lung sounds in all lobes. Respirations equal and unlabored. Abdomen:  +BS, soft, non-tender and non-distended. No rebound or guarding. No HSM or masses noted. Derm: No palmar erythema or jaundice Msk:  Symmetrical without gross deformities. Normal posture. Extremities:  Without edema. Neurologic:  Alert and  oriented x4 Psych:  Alert and cooperative. Normal mood and affect.  Invalid input(s): "6 MONTHS"   ASSESSMENT: Stephen Burch is a 68 y.o. male presenting today   Cirrhosis with EVs:  GERD:   PLAN:  -update CBC, CMP, INR, AFP -continue lasix 40/spironolactone 200mg  daily    Follow Up: 6 months   Elliet Goodnow L. Jeanmarie Hubert, MSN, APRN, AGNP-C Adult-Gerontology Nurse Practitioner [facility]

## 2023-09-24 LAB — CBC
HCT: 28.3 % — ABNORMAL LOW (ref 38.5–50.0)
Hemoglobin: 8.8 g/dL — ABNORMAL LOW (ref 13.2–17.1)
MCH: 25.4 pg — ABNORMAL LOW (ref 27.0–33.0)
MCHC: 31.1 g/dL — ABNORMAL LOW (ref 32.0–36.0)
MCV: 81.6 fL (ref 80.0–100.0)
MPV: 11.7 fL (ref 7.5–12.5)
Platelets: 90 10*3/uL — ABNORMAL LOW (ref 140–400)
RBC: 3.47 10*6/uL — ABNORMAL LOW (ref 4.20–5.80)
RDW: 16.3 % — ABNORMAL HIGH (ref 11.0–15.0)
WBC: 2.2 10*3/uL — ABNORMAL LOW (ref 3.8–10.8)

## 2023-09-24 LAB — COMPREHENSIVE METABOLIC PANEL
AG Ratio: 1.6 (calc) (ref 1.0–2.5)
ALT: 23 U/L (ref 9–46)
AST: 25 U/L (ref 10–35)
Albumin: 4.1 g/dL (ref 3.6–5.1)
Alkaline phosphatase (APISO): 101 U/L (ref 35–144)
BUN: 15 mg/dL (ref 7–25)
CO2: 26 mmol/L (ref 20–32)
Calcium: 8.5 mg/dL — ABNORMAL LOW (ref 8.6–10.3)
Chloride: 105 mmol/L (ref 98–110)
Creat: 1.04 mg/dL (ref 0.70–1.35)
Globulin: 2.5 g/dL (ref 1.9–3.7)
Glucose, Bld: 159 mg/dL — ABNORMAL HIGH (ref 65–99)
Potassium: 4 mmol/L (ref 3.5–5.3)
Sodium: 136 mmol/L (ref 135–146)
Total Bilirubin: 1 mg/dL (ref 0.2–1.2)
Total Protein: 6.6 g/dL (ref 6.1–8.1)
eGFR: 78 mL/min/{1.73_m2} (ref >=60)

## 2023-09-24 LAB — AFP TUMOR MARKER: AFP-Tumor Marker: 2.5 ng/mL (ref <6.1)

## 2023-09-24 LAB — PROTIME-INR
INR: 1
Prothrombin Time: 11.2 s (ref 9.0–11.5)

## 2023-09-25 ENCOUNTER — Other Ambulatory Visit (INDEPENDENT_AMBULATORY_CARE_PROVIDER_SITE_OTHER): Payer: Self-pay | Admitting: Gastroenterology

## 2023-09-25 DIAGNOSIS — D649 Anemia, unspecified: Secondary | ICD-10-CM | POA: Diagnosis not present

## 2023-09-26 ENCOUNTER — Other Ambulatory Visit (INDEPENDENT_AMBULATORY_CARE_PROVIDER_SITE_OTHER): Payer: Self-pay | Admitting: Gastroenterology

## 2023-09-26 DIAGNOSIS — Z79899 Other long term (current) drug therapy: Secondary | ICD-10-CM

## 2023-09-26 DIAGNOSIS — R188 Other ascites: Secondary | ICD-10-CM

## 2023-09-26 DIAGNOSIS — K7460 Unspecified cirrhosis of liver: Secondary | ICD-10-CM

## 2023-09-26 LAB — IRON,TIBC AND FERRITIN PANEL
%SAT: 4 % — ABNORMAL LOW (ref 20–48)
Ferritin: 15 ng/mL — ABNORMAL LOW (ref 24–380)
Iron: 19 ug/dL — ABNORMAL LOW (ref 50–180)
TIBC: 456 ug/dL — ABNORMAL HIGH (ref 250–425)

## 2023-09-26 MED ORDER — FERROUS SULFATE 325 (65 FE) MG PO TABS: 325.0000 mg | ORAL_TABLET | Freq: Every day | ORAL | 1 refills | Status: DC

## 2023-09-30 DIAGNOSIS — B1910 Unspecified viral hepatitis B without hepatic coma: Secondary | ICD-10-CM | POA: Diagnosis not present

## 2023-09-30 DIAGNOSIS — Z23 Encounter for immunization: Secondary | ICD-10-CM | POA: Diagnosis not present

## 2023-10-02 ENCOUNTER — Other Ambulatory Visit (INDEPENDENT_AMBULATORY_CARE_PROVIDER_SITE_OTHER): Payer: Self-pay | Admitting: Gastroenterology

## 2023-10-02 MED ORDER — PEG 3350-KCL-NA BICARB-NACL 420 G PO SOLR: 4000.0000 mL | Freq: Once | ORAL | 0 refills | Status: AC

## 2023-10-10 DIAGNOSIS — E1165 Type 2 diabetes mellitus with hyperglycemia: Secondary | ICD-10-CM | POA: Diagnosis not present

## 2023-10-10 DIAGNOSIS — E0590 Thyrotoxicosis, unspecified without thyrotoxic crisis or storm: Secondary | ICD-10-CM | POA: Diagnosis not present

## 2023-10-10 DIAGNOSIS — D649 Anemia, unspecified: Secondary | ICD-10-CM | POA: Diagnosis not present

## 2023-10-10 DIAGNOSIS — K7460 Unspecified cirrhosis of liver: Secondary | ICD-10-CM | POA: Diagnosis not present

## 2023-10-14 NOTE — Patient Instructions (Signed)
   Your procedure is scheduled on: 10/22/2023  Report to [facility] Main Entrance at    8:00 AM.  Call this number if you have problems the morning of surgery: [phone]   Remember:              Follow Directions on the letter you received from Your Physician's office regarding the Bowel Prep              No Smoking the day of Procedure :   Take these medicines the morning of surgery with A SIP OF WATER: Pantoprazole and Methimazole   Do not wear jewelry, make-up or nail polish.    Do not bring valuables to the hospital.  Contacts, dentures or bridgework may not be worn into surgery.  .   Patients discharged the day of surgery will not be allowed to drive home.     Colonoscopy, Adult, Care After This sheet gives you information about how to care for yourself after your procedure. Your health care provider may also give you more specific instructions. If you have problems or questions, contact your health care provider. What can I expect after the procedure? After the procedure, it is common to have: A small amount of blood in your stool for 24 hours after the procedure. Some gas. Mild abdominal cramping or bloating.  Follow these instructions at home: General instructions  For the first 24 hours after the procedure: Do not drive or use machinery. Do not sign important documents. Do not drink alcohol. Do your regular daily activities at a slower pace than normal. Eat soft, easy-to-digest foods. Rest often. Take over-the-counter or prescription medicines only as told by your health care provider. It is up to you to get the results of your procedure. Ask your health care provider, or the department performing the procedure, when your results will be ready. Relieving cramping and bloating Try walking around when you have cramps or feel bloated. Apply heat to your abdomen as told by your health care provider. Use a heat source that your health care provider recommends, such as  a moist heat pack or a heating pad. Place a towel between your skin and the heat source. Leave the heat on for 20-30 minutes. Remove the heat if your skin turns bright red. This is especially important if you are unable to feel pain, heat, or cold. You may have a greater risk of getting burned. Eating and drinking Drink enough fluid to keep your urine clear or pale yellow. Resume your normal diet as instructed by your health care provider. Avoid heavy or fried foods that are hard to digest. Avoid drinking alcohol for as long as instructed by your health care provider. Contact a health care provider if: You have blood in your stool 2-3 days after the procedure. Get help right away if: You have more than a small spotting of blood in your stool. You pass large blood clots in your stool. Your abdomen is swollen. You have nausea or vomiting. You have a fever. You have increasing abdominal pain that is not relieved with medicine. This information is not intended to replace advice given to you by your health care provider. Make sure you discuss any questions you have with your health care provider. Document Released: 01/31/2004 Document Revised: 03/12/2016 Document Reviewed: 08/30/2015 Elsevier Interactive Patient Education  [employer].

## 2023-10-16 ENCOUNTER — Encounter (HOSPITAL_COMMUNITY): Payer: Self-pay

## 2023-10-16 ENCOUNTER — Encounter (HOSPITAL_COMMUNITY)
Admission: RE | Admit: 2023-10-16 | Discharge: 2023-10-16 | Disposition: A | Discharge status: Home / Self Care | Source: Ambulatory Visit | Attending: Gastroenterology | Admitting: Gastroenterology

## 2023-10-16 VITALS — BP 122/67 | HR 82 | Temp 98.0°F | Resp 18 | Ht 72.0 in | Wt 205.7 lb

## 2023-10-16 DIAGNOSIS — Z01810 Encounter for preprocedural cardiovascular examination: Secondary | ICD-10-CM | POA: Diagnosis not present

## 2023-10-16 DIAGNOSIS — Z8679 Personal history of other diseases of the circulatory system: Secondary | ICD-10-CM | POA: Insufficient documentation

## 2023-10-22 ENCOUNTER — Ambulatory Visit (HOSPITAL_BASED_OUTPATIENT_CLINIC_OR_DEPARTMENT_OTHER)

## 2023-10-22 ENCOUNTER — Encounter (HOSPITAL_COMMUNITY)
Admission: RE | Disposition: A | Discharge status: Home / Self Care | Payer: Self-pay | Source: Home / Self Care | Attending: Gastroenterology

## 2023-10-22 ENCOUNTER — Encounter (HOSPITAL_COMMUNITY): Payer: Self-pay | Admitting: Gastroenterology

## 2023-10-22 ENCOUNTER — Other Ambulatory Visit: Payer: Self-pay

## 2023-10-22 ENCOUNTER — Ambulatory Visit (HOSPITAL_COMMUNITY)

## 2023-10-22 ENCOUNTER — Ambulatory Visit (HOSPITAL_COMMUNITY)
Admission: RE | Admit: 2023-10-22 | Discharge: 2023-10-22 | Disposition: A | Discharge status: Home / Self Care | Attending: Gastroenterology | Admitting: Gastroenterology

## 2023-10-22 DIAGNOSIS — I110 Hypertensive heart disease with heart failure: Secondary | ICD-10-CM | POA: Diagnosis not present

## 2023-10-22 DIAGNOSIS — E0590 Thyrotoxicosis, unspecified without thyrotoxic crisis or storm: Secondary | ICD-10-CM | POA: Diagnosis not present

## 2023-10-22 DIAGNOSIS — I509 Heart failure, unspecified: Secondary | ICD-10-CM | POA: Insufficient documentation

## 2023-10-22 DIAGNOSIS — R7303 Prediabetes: Secondary | ICD-10-CM | POA: Insufficient documentation

## 2023-10-22 DIAGNOSIS — Z7984 Long term (current) use of oral hypoglycemic drugs: Secondary | ICD-10-CM | POA: Diagnosis not present

## 2023-10-22 DIAGNOSIS — J45909 Unspecified asthma, uncomplicated: Secondary | ICD-10-CM | POA: Diagnosis not present

## 2023-10-22 DIAGNOSIS — K7581 Nonalcoholic steatohepatitis (NASH): Secondary | ICD-10-CM | POA: Diagnosis not present

## 2023-10-22 DIAGNOSIS — D509 Iron deficiency anemia, unspecified: Secondary | ICD-10-CM

## 2023-10-22 DIAGNOSIS — K449 Diaphragmatic hernia without obstruction or gangrene: Secondary | ICD-10-CM | POA: Insufficient documentation

## 2023-10-22 DIAGNOSIS — K5520 Angiodysplasia of colon without hemorrhage: Secondary | ICD-10-CM | POA: Insufficient documentation

## 2023-10-22 DIAGNOSIS — D120 Benign neoplasm of cecum: Secondary | ICD-10-CM | POA: Insufficient documentation

## 2023-10-22 DIAGNOSIS — Z79899 Other long term (current) drug therapy: Secondary | ICD-10-CM | POA: Diagnosis not present

## 2023-10-22 DIAGNOSIS — Z87891 Personal history of nicotine dependence: Secondary | ICD-10-CM | POA: Diagnosis not present

## 2023-10-22 DIAGNOSIS — K649 Unspecified hemorrhoids: Secondary | ICD-10-CM | POA: Diagnosis not present

## 2023-10-22 DIAGNOSIS — K635 Polyp of colon: Secondary | ICD-10-CM | POA: Diagnosis not present

## 2023-10-22 DIAGNOSIS — E119 Type 2 diabetes mellitus without complications: Secondary | ICD-10-CM | POA: Diagnosis not present

## 2023-10-22 DIAGNOSIS — I868 Varicose veins of other specified sites: Secondary | ICD-10-CM | POA: Diagnosis not present

## 2023-10-22 DIAGNOSIS — D123 Benign neoplasm of transverse colon: Secondary | ICD-10-CM | POA: Diagnosis not present

## 2023-10-22 DIAGNOSIS — K219 Gastro-esophageal reflux disease without esophagitis: Secondary | ICD-10-CM | POA: Diagnosis not present

## 2023-10-22 DIAGNOSIS — D500 Iron deficiency anemia secondary to blood loss (chronic): Secondary | ICD-10-CM

## 2023-10-22 HISTORY — PX: COLONOSCOPY: SHX5424

## 2023-10-22 LAB — HM COLONOSCOPY

## 2023-10-22 LAB — GLUCOSE, CAPILLARY: Glucose-Capillary: 123 mg/dL — ABNORMAL HIGH (ref 70–99)

## 2023-10-22 SURGERY — COLONOSCOPY
Anesthesia: General

## 2023-10-22 MED ORDER — LIDOCAINE HCL (CARDIAC) PF 100 MG/5ML IV SOSY
PREFILLED_SYRINGE | INTRAVENOUS | Status: DC | PRN
  Administered 2023-10-22: 100 mg via INTRAVENOUS

## 2023-10-22 MED ORDER — PROPOFOL 10 MG/ML IV BOLUS
INTRAVENOUS | Status: DC | PRN
  Administered 2023-10-22: 150 ug/kg/min via INTRAVENOUS
  Administered 2023-10-22: 100 mg via INTRAVENOUS

## 2023-10-22 MED ORDER — PHENYLEPHRINE 80 MCG/ML (10ML) SYRINGE FOR IV PUSH (FOR BLOOD PRESSURE SUPPORT)
PREFILLED_SYRINGE | INTRAVENOUS | Status: AC
Start: 2023-10-22 — End: ?
  Filled 2023-10-22: qty 10

## 2023-10-22 MED ORDER — LACTATED RINGERS IV SOLN: INTRAVENOUS | Status: DC | PRN

## 2023-10-22 MED ORDER — PHENYLEPHRINE 80 MCG/ML (10ML) SYRINGE FOR IV PUSH (FOR BLOOD PRESSURE SUPPORT)
PREFILLED_SYRINGE | INTRAVENOUS | Status: AC
  Filled 2023-10-22: qty 10

## 2023-10-22 MED ORDER — PHENYLEPHRINE 80 MCG/ML (10ML) SYRINGE FOR IV PUSH (FOR BLOOD PRESSURE SUPPORT)
PREFILLED_SYRINGE | INTRAVENOUS | Status: DC | PRN
  Administered 2023-10-22 (×2): 160 ug via INTRAVENOUS
  Administered 2023-10-22: 80 ug via INTRAVENOUS
  Administered 2023-10-22 (×3): 160 ug via INTRAVENOUS
  Administered 2023-10-22 (×3): 80 ug via INTRAVENOUS

## 2023-10-22 MED ORDER — STERILE WATER FOR IRRIGATION IR SOLN
Status: DC | PRN
  Administered 2023-10-22: 120 mL

## 2023-10-22 NOTE — Transfer of Care (Signed)
 Immediate Anesthesia Transfer of Care Note  Patient: Stephen Burch  Procedure(s) Performed: COLONOSCOPY  Patient Location: Short Stay  Anesthesia Type:General  Level of Consciousness: drowsy  Airway & Oxygen Therapy: Patient Spontanous Breathing  Post-op Assessment: Report given to RN and Post -op Vital signs reviewed and stable  Post vital signs: Reviewed and stable  Last Vitals:  Vitals Value Taken Time  BP    Temp    Pulse    Resp    SpO2      Last Pain:  Vitals:   10/22/23 0825  TempSrc: Oral  PainSc: 0-No pain      Patients Stated Pain Goal: 7 (10/22/23 0825)  Complications: No notable events documented.

## 2023-10-22 NOTE — Discharge Instructions (Signed)
You are being discharged to home.  Resume your previous diet.  We are waiting for your pathology results.  Your physician has recommended a repeat colonoscopy for surveillance based on pathology results.  Continue your present medications.

## 2023-10-22 NOTE — Anesthesia Preprocedure Evaluation (Signed)
 Anesthesia Evaluation  Patient identified by MRN, date of birth, ID band Patient awake    Reviewed: Allergy & Precautions, H&P , NPO status , Patient's Chart, lab work & pertinent test results, reviewed documented beta blocker date and time   History of Anesthesia Complications (+) history of anesthetic complications  Airway Mallampati: II  TM Distance: >3 FB Neck ROM: full    Dental no notable dental hx.    Pulmonary neg pulmonary ROS, asthma , former smoker   Pulmonary exam normal breath sounds clear to auscultation       Cardiovascular Exercise Tolerance: Good hypertension, +CHF  negative cardio ROS + dysrhythmias  Rhythm:regular Rate:Normal     Neuro/Psych  Neuromuscular disease negative neurological ROS  negative psych ROS   GI/Hepatic negative GI ROS, Neg liver ROS, hiatal hernia,GERD  ,,(+) Hepatitis -  Endo/Other  negative endocrine ROSdiabetes Hyperthyroidism   Renal/GU negative Renal ROS  negative genitourinary   Musculoskeletal   Abdominal   Peds  Hematology negative hematology ROS (+) Blood dyscrasia, anemia   Anesthesia Other Findings   Reproductive/Obstetrics negative OB ROS                              Anesthesia Physical Anesthesia Plan  ASA: 3  Anesthesia Plan: General   Post-op Pain Management:    Induction:   PONV Risk Score and Plan: Propofol  infusion  Airway Management Planned:   Additional Equipment:   Intra-op Plan:   Post-operative Plan:   Informed Consent: I have reviewed the patients History and Physical, chart, labs and discussed the procedure including the risks, benefits and alternatives for the proposed anesthesia with the patient or authorized representative who has indicated his/her understanding and acceptance.     Dental Advisory Given  Plan Discussed with: CRNA  Anesthesia Plan Comments:          Anesthesia Quick  Evaluation

## 2023-10-22 NOTE — Op Note (Signed)
 [facility] Patient Name: Stephen Burch Procedure Date: 10/22/2023 9:06 AM MRN: [id number] Date of Birth: 04/27/1956 Attending MD: Samantha Cress , , [id number] CSN: [id number] Age: 68 Admit Type: Outpatient Procedure:                Colonoscopy Indications:              Iron deficiency anemia Providers:                Samantha Cress, Pasco Bond, RN, Sharlette Dayhoff                            Technician, Technician Referring MD:             Samantha Cress Medicines:                Monitored Anesthesia Care Complications:            No immediate complications. Estimated Blood Loss:     Estimated blood loss: none. Procedure:                Pre-Anesthesia Assessment:                           - Prior to the procedure, a History and Physical                            was performed, and patient medications, allergies                            and sensitivities were reviewed. The patient's                            tolerance of previous anesthesia was reviewed.                           - The risks and benefits of the procedure and the                            sedation options and risks were discussed with the                            patient. All questions were answered and informed                            consent was obtained.                           - ASA Grade Assessment: III - A patient with severe                            systemic disease.                           After obtaining informed consent, the colonoscope                            was passed under direct vision. Throughout the  procedure, the patient's blood pressure, pulse, and                            oxygen saturations were monitored continuously. The                            [id number]-HQ190L ([id number]) scope was introduced through                            the anus and advanced to the the terminal ileum.                            The colonoscopy was performed without  difficulty.                            The patient tolerated the procedure well. The                            quality of the bowel preparation was adequate. Scope In: 9:25:03 AM Scope Out: 10:12:52 AM Scope Withdrawal Time: 0 hours 36 minutes 47 seconds  Total Procedure Duration: 0 hours 47 minutes 49 seconds  Findings:      The perianal and digital rectal examinations were normal.      The terminal ileum appeared normal.      9 medium-sized localized angiodysplastic lesions without bleeding were       found in the descending colon, in the transverse colon, in the ascending       colon and in the cecum. Coagulation for bleeding prevention using argon       plasma at 0.3 liters/minute and 20 watts was successful.      Two sessile polyps were found in the cecum. The polyps were 1 to 2 mm in       size. These polyps were removed with a cold biopsy forceps. Resection       and retrieval were complete.      Four sessile polyps were found in the transverse colon and cecum. The       polyps were 3 to 6 mm in size. These polyps were removed with a cold       snare. Resection and retrieval were complete.      Two sessile polyps were found in the sigmoid colon and descending colon.       The polyps were 3 to 4 mm in size. These polyps were removed with a cold       snare. Resection and retrieval were complete.      Medium sized, non-bleeding rectal varices were found. Impression:               - The examined portion of the ileum was normal.                           - 9 non-bleeding colonic angiodysplastic lesions.                            Treated with argon plasma coagulation (APC).                           -  Two 1 to 2 mm polyps in the cecum, removed with a                            cold biopsy forceps. Resected and retrieved.                           - Four 3 to 6 mm polyps in the transverse colon and                            in the cecum, removed with a cold snare. Resected                             and retrieved.                           - Two 3 to 4 mm polyps in the sigmoid colon and in                            the descending colon, removed with a cold snare.                            Resected and retrieved.                           - Rectal varices. Moderate Sedation:      Per Anesthesia Care Recommendation:           - Discharge patient to home (ambulatory).                           - Resume previous diet.                           - Await pathology results.                           - Repeat colonoscopy for surveillance based on                            pathology results.                           - Continue present medications.                           - If presenting rectal bleeding, consider TIPS for                            possible variceal bleeding. Procedure Code(s):        --- Professional ---                           [phone], 59, Colonoscopy, flexible; with control of                            bleeding, any method  16109, Colonoscopy, flexible; with removal of                            tumor(s), polyp(s), or other lesion(s) by snare                            technique                           45380, 59, Colonoscopy, flexible; with biopsy,                            single or multiple Diagnosis Code(s):        --- Professional ---                           K55.20, Angiodysplasia of colon without hemorrhage                           D12.3, Benign neoplasm of transverse colon (hepatic                            flexure or splenic flexure)                           D12.0, Benign neoplasm of cecum                           D12.5, Benign neoplasm of sigmoid colon                           D12.4, Benign neoplasm of descending colon                           K64.8, Other hemorrhoids                           D50.9, Iron deficiency anemia, unspecified CPT copyright 2022 American [employer] Association. All rights  reserved. The codes documented in this report are preliminary and upon coder review may  be revised to meet current compliance requirements. Samantha Cress, MD Samantha Cress,  10/22/2023 10:21:30 AM This report has been signed electronically. Number of Addenda: 0

## 2023-10-22 NOTE — Anesthesia Procedure Notes (Signed)
 Date/Time: 10/22/2023 9:18 AM  Performed by: Sherwin Donate, CRNAPre-anesthesia Checklist: Patient identified, Emergency Drugs available, Suction available and Patient being monitored Patient Re-evaluated:Patient Re-evaluated prior to induction Oxygen Delivery Method: Simple face mask Induction Type: IV induction Placement Confirmation: positive ETCO2

## 2023-10-22 NOTE — Interval H&P Note (Signed)
 History and Physical Interval Note:  10/22/2023 8:00 AM  Stephen Burch  has presented today for surgery, with the diagnosis of IRON DEFICIENCY ANEMIA.  The various methods of treatment have been discussed with the patient and family. After consideration of risks, benefits and other options for treatment, the patient has consented to  Procedure(s) with comments: COLONOSCOPY (N/A) - 9:45AM;ASA 3 as a surgical intervention.  The patient's history has been reviewed, patient examined, no change in status, stable for surgery.  I have reviewed the patient's chart and labs.  Questions were answered to the patient's satisfaction.     Cheryle Dark Castaneda Mayorga

## 2023-10-23 ENCOUNTER — Encounter (INDEPENDENT_AMBULATORY_CARE_PROVIDER_SITE_OTHER): Payer: Self-pay | Admitting: *Deleted

## 2023-10-23 ENCOUNTER — Encounter (HOSPITAL_COMMUNITY): Payer: Self-pay | Admitting: Gastroenterology

## 2023-10-24 DIAGNOSIS — F5221 Male erectile disorder: Secondary | ICD-10-CM | POA: Diagnosis not present

## 2023-10-24 DIAGNOSIS — E0590 Thyrotoxicosis, unspecified without thyrotoxic crisis or storm: Secondary | ICD-10-CM | POA: Diagnosis not present

## 2023-10-24 DIAGNOSIS — Z0001 Encounter for general adult medical examination with abnormal findings: Secondary | ICD-10-CM | POA: Diagnosis not present

## 2023-10-24 DIAGNOSIS — R972 Elevated prostate specific antigen [PSA]: Secondary | ICD-10-CM | POA: Diagnosis not present

## 2023-10-24 DIAGNOSIS — D61818 Other pancytopenia: Secondary | ICD-10-CM | POA: Diagnosis not present

## 2023-10-24 DIAGNOSIS — K7460 Unspecified cirrhosis of liver: Secondary | ICD-10-CM | POA: Diagnosis not present

## 2023-10-24 DIAGNOSIS — N3281 Overactive bladder: Secondary | ICD-10-CM | POA: Diagnosis not present

## 2023-10-24 DIAGNOSIS — I8500 Esophageal varices without bleeding: Secondary | ICD-10-CM | POA: Diagnosis not present

## 2023-10-24 DIAGNOSIS — B360 Pityriasis versicolor: Secondary | ICD-10-CM | POA: Diagnosis not present

## 2023-10-24 DIAGNOSIS — Z125 Encounter for screening for malignant neoplasm of prostate: Secondary | ICD-10-CM | POA: Diagnosis not present

## 2023-10-24 DIAGNOSIS — K219 Gastro-esophageal reflux disease without esophagitis: Secondary | ICD-10-CM | POA: Diagnosis not present

## 2023-10-24 DIAGNOSIS — E1165 Type 2 diabetes mellitus with hyperglycemia: Secondary | ICD-10-CM | POA: Diagnosis not present

## 2023-10-24 LAB — SURGICAL PATHOLOGY

## 2023-10-25 NOTE — Anesthesia Postprocedure Evaluation (Signed)
 Anesthesia Post Note  Patient: Stephen Burch  Procedure(s) Performed: COLONOSCOPY  Patient location during evaluation: Phase II Anesthesia Type: General Level of consciousness: awake Pain management: pain level controlled Vital Signs Assessment: post-procedure vital signs reviewed and stable Respiratory status: spontaneous breathing and respiratory function stable Cardiovascular status: blood pressure returned to baseline and stable Postop Assessment: no headache and no apparent nausea or vomiting Anesthetic complications: no Comments: Late entry   No notable events documented.   Last Vitals:  Vitals:   10/22/23 1019 10/22/23 1022  BP: (!) 95/51 (!) 107/94  Pulse: (!) 57   Resp: 13 13  Temp: 36.6 C   SpO2: 100% 100%    Last Pain:  Vitals:   10/23/23 1446  TempSrc:   PainSc: 0-No pain                 Coretha Dew

## 2023-10-28 ENCOUNTER — Encounter (INDEPENDENT_AMBULATORY_CARE_PROVIDER_SITE_OTHER): Payer: Self-pay | Admitting: *Deleted

## 2023-11-03 ENCOUNTER — Emergency Department (HOSPITAL_COMMUNITY)

## 2023-11-03 ENCOUNTER — Emergency Department (HOSPITAL_COMMUNITY)
Admission: EM | Admit: 2023-11-03 | Discharge: 2023-11-03 | Disposition: A | Discharge status: Home / Self Care | Attending: Emergency Medicine | Admitting: Emergency Medicine

## 2023-11-03 ENCOUNTER — Other Ambulatory Visit: Payer: Self-pay

## 2023-11-03 ENCOUNTER — Encounter (HOSPITAL_COMMUNITY): Payer: Self-pay

## 2023-11-03 DIAGNOSIS — R160 Hepatomegaly, not elsewhere classified: Secondary | ICD-10-CM | POA: Insufficient documentation

## 2023-11-03 DIAGNOSIS — R109 Unspecified abdominal pain: Secondary | ICD-10-CM | POA: Diagnosis not present

## 2023-11-03 DIAGNOSIS — K766 Portal hypertension: Secondary | ICD-10-CM | POA: Diagnosis not present

## 2023-11-03 DIAGNOSIS — K2980 Duodenitis without bleeding: Secondary | ICD-10-CM | POA: Diagnosis not present

## 2023-11-03 DIAGNOSIS — K828 Other specified diseases of gallbladder: Secondary | ICD-10-CM | POA: Diagnosis not present

## 2023-11-03 DIAGNOSIS — K8020 Calculus of gallbladder without cholecystitis without obstruction: Secondary | ICD-10-CM | POA: Diagnosis not present

## 2023-11-03 DIAGNOSIS — R1013 Epigastric pain: Secondary | ICD-10-CM | POA: Diagnosis not present

## 2023-11-03 DIAGNOSIS — K7460 Unspecified cirrhosis of liver: Secondary | ICD-10-CM | POA: Insufficient documentation

## 2023-11-03 DIAGNOSIS — R932 Abnormal findings on diagnostic imaging of liver and biliary tract: Secondary | ICD-10-CM | POA: Diagnosis not present

## 2023-11-03 LAB — CBC WITH DIFFERENTIAL/PLATELET
Abs Immature Granulocytes: 0.01 10*3/uL (ref 0.00–0.07)
Basophils Absolute: 0 10*3/uL (ref 0.0–0.1)
Basophils Relative: 1 %
Eosinophils Absolute: 0.1 10*3/uL (ref 0.0–0.5)
Eosinophils Relative: 2 %
HCT: 29.9 % — ABNORMAL LOW (ref 39.0–52.0)
Hemoglobin: 9.6 g/dL — ABNORMAL LOW (ref 13.0–17.0)
Immature Granulocytes: 0 %
Lymphocytes Relative: 4 %
Lymphs Abs: 0.1 10*3/uL — ABNORMAL LOW (ref 0.7–4.0)
MCH: 28.9 pg (ref 26.0–34.0)
MCHC: 32.1 g/dL (ref 30.0–36.0)
MCV: 90.1 fL (ref 80.0–100.0)
Monocytes Absolute: 0.2 10*3/uL (ref 0.1–1.0)
Monocytes Relative: 6 %
Neutro Abs: 3 10*3/uL (ref 1.7–7.7)
Neutrophils Relative %: 87 %
Platelets: 81 10*3/uL — ABNORMAL LOW (ref 150–400)
RBC: 3.32 MIL/uL — ABNORMAL LOW (ref 4.22–5.81)
RDW: 21.5 % — ABNORMAL HIGH (ref 11.5–15.5)
WBC: 3.4 10*3/uL — ABNORMAL LOW (ref 4.0–10.5)
nRBC: 0 % (ref 0.0–0.2)

## 2023-11-03 LAB — COMPREHENSIVE METABOLIC PANEL WITH GFR
ALT: 25 U/L (ref 0–44)
AST: 39 U/L (ref 15–41)
Albumin: 3.9 g/dL (ref 3.5–5.0)
Alkaline Phosphatase: 113 U/L (ref 38–126)
Anion gap: 9 (ref 5–15)
BUN: 13 mg/dL (ref 8–23)
CO2: 22 mmol/L (ref 22–32)
Calcium: 8.5 mg/dL — ABNORMAL LOW (ref 8.9–10.3)
Chloride: 102 mmol/L (ref 98–111)
Creatinine, Ser: 0.95 mg/dL (ref 0.61–1.24)
GFR, Estimated: >60 mL/min (ref >60)
Glucose, Bld: 232 mg/dL — ABNORMAL HIGH (ref 70–99)
Potassium: 3.6 mmol/L (ref 3.5–5.1)
Sodium: 133 mmol/L — ABNORMAL LOW (ref 135–145)
Total Bilirubin: 4.7 mg/dL — ABNORMAL HIGH (ref 0.0–1.2)
Total Protein: 6.6 g/dL (ref 6.5–8.1)

## 2023-11-03 LAB — LIPASE, BLOOD: Lipase: 50 U/L (ref 11–51)

## 2023-11-03 LAB — PROTIME-INR
INR: 1.3 — ABNORMAL HIGH (ref 0.8–1.2)
Prothrombin Time: 16.7 s — ABNORMAL HIGH (ref 11.4–15.2)

## 2023-11-03 LAB — APTT: aPTT: 30 s (ref 24–36)

## 2023-11-03 MED ORDER — FENTANYL CITRATE PF 50 MCG/ML IJ SOSY
50.0000 ug | PREFILLED_SYRINGE | Freq: Once | INTRAMUSCULAR | Status: AC
  Administered 2023-11-03: 50 ug via INTRAVENOUS
  Filled 2023-11-03: qty 1

## 2023-11-03 MED ORDER — IOHEXOL 300 MG/ML  SOLN
125.0000 mL | Freq: Once | INTRAMUSCULAR | Status: AC | PRN
  Administered 2023-11-03: 125 mL via INTRAVENOUS

## 2023-11-03 NOTE — Discharge Instructions (Addendum)
 You were seen in the emergency room for abdominal pain. The CT scan does not show any severe complications such as clotting. There was concern for gallbladder disease, but the ultrasound is also reassuring.  Ultrasound did reveal gallstones.  Please consider following up with Dr. Collene Dawson, general surgeon.  There was a small liver mass that was noted on the CT scan, that will need surveillance by your liver doctor.  Please mention that finding during her follow-up next month with the GI doctors.  Clear liquid diet for the next 24 to 48 hours, thereafter you can slowly advance.  Return to the ER if your symptoms get worse.

## 2023-11-03 NOTE — ED Triage Notes (Signed)
 Pt arrives POV c/o epigastric pain starting at 9pm last night. Denies n/v/d. History of cirrhosis and enlarged spleen.

## 2023-11-03 NOTE — ED Provider Notes (Signed)
 [place] EMERGENCY DEPARTMENT AT [facility] Provider Note   CSN: [id number] Arrival date & time: 11/03/23  2440     History  Chief Complaint  Patient presents with   Abdominal Pain    Stephen Burch is a 68 y.o. male.  HPI    68 year old male with history of Florentina Huntsman liver cirrhosis with complications gastric and esophageal varices, previous hernia repair surgeries comes in with chief complaint of epigastric abdominal pain.  Patient's pain started at 9 PM.  He indicates that the pain is dull, constant, nonradiating with no specific evoking, aggravating or relieving factors.  He has no previous history of similar pain.  There is no chest pain, shortness of breath.  Patient denies any nausea, vomiting, diarrhea.  He has not seen any bloody stools.  Patient does have a history of ascites, but denies any need for paracentesis.  He has no associated fevers, chills.  Home Medications Prior to Admission medications   Medication Sig Start Date End Date Taking? Authorizing Provider  EPINEPHrine  (PRIMATENE  MIST) 0.125 MG/ACT AERO Inhale 1 puff into the lungs daily as needed (asthma).    [provider]  ferrous sulfate  325 (65 FE) MG tablet Take 1 tablet (325 mg total) by mouth daily with breakfast. 09/26/23   Carlan, [name] L, NP  furosemide  (LASIX ) 40 MG tablet Take 2 tablets by mouth once daily 08/26/23   Carlan, [name] L, NP  glipiZIDE  (GLUCOTROL  XL) 2.5 MG 24 hr tablet Take 1 tablet (2.5 mg total) by mouth daily with breakfast. Please schedule office visit before any future refills Patient taking differently: Take 10 mg by mouth daily with breakfast. Please schedule office visit before any future refills 08/29/22   Lamon Pillow, MD  methimazole  (TAPAZOLE ) 10 MG tablet Take 1 tablet (10 mg total) by mouth daily. 09/03/23   Motwani, Komal, MD  pantoprazole  (PROTONIX ) 40 MG tablet Take 1 tablet (40 mg total) by mouth daily before breakfast. 08/27/22   Carlan, [name] L, NP   sildenafil (VIAGRA) 100 MG tablet Take 100 mg by mouth daily as needed for erectile dysfunction.    [provider]  spironolactone  (ALDACTONE ) 100 MG tablet Take 2 tablets by mouth once daily Patient taking differently: Take 1,100 mg by mouth daily. 04/09/23   Urban Garden, MD      Allergies    Asa [aspirin], Penicillins, and Vancomycin     Review of Systems   Review of Systems  All other systems reviewed and are negative.   Physical Exam Updated Vital Signs BP 123/62   Pulse 74   Temp 97.8 F (36.6 C) (Oral)   Resp 16   Ht 6' (1.829 m)   Wt 93 kg   SpO2 100%   BMI 27.80 kg/m  Physical Exam Vitals and nursing note reviewed.  Constitutional:      Appearance: He is well-developed.  HENT:     Head: Atraumatic.  Cardiovascular:     Rate and Rhythm: Normal rate.  Pulmonary:     Effort: Pulmonary effort is normal.  Abdominal:     Tenderness: There is abdominal tenderness in the right upper quadrant, epigastric area and left upper quadrant. There is no guarding or rebound. Negative signs include McBurney's sign.  Musculoskeletal:     Cervical back: Neck supple.  Skin:    General: Skin is warm.  Neurological:     Mental Status: He is alert and oriented to person, place, and time.     ED  Results / Procedures / Treatments   Labs (all labs ordered are listed, but only abnormal results are displayed) Labs Reviewed  CBC WITH DIFFERENTIAL/PLATELET - Abnormal; Notable for the following components:      Result Value   WBC 3.4 (*)    RBC 3.32 (*)    Hemoglobin 9.6 (*)    HCT 29.9 (*)    RDW 21.5 (*)    Platelets 81 (*)    Lymphs Abs 0.1 (*)    All other components within normal limits  COMPREHENSIVE METABOLIC PANEL WITH GFR - Abnormal; Notable for the following components:   Sodium 133 (*)    Glucose, Bld 232 (*)    Calcium 8.5 (*)    Total Bilirubin 4.7 (*)    All other components within normal limits  PROTIME-INR - Abnormal; Notable for the  following components:   Prothrombin Time 16.7 (*)    INR 1.3 (*)    All other components within normal limits  LIPASE, BLOOD  APTT    EKG EKG Interpretation Date/Time:  Sunday Nov 03 2023 06:59:26 EDT Ventricular Rate:  67 PR Interval:  162 QRS Duration:  94 QT Interval:  433 QTC Calculation: 458 R Axis:   16  Text Interpretation: Sinus rhythm No acute changes normal intervals No significant change since last tracing Confirmed by Deatra Face [phone]) on 11/03/2023 8:51:41 AM  Radiology US  Abdomen Limited RUQ (LIVER/GB) Result Date: 11/03/2023 CLINICAL DATA:  68 year old male with right upper quadrant abdominal pain acute onset. Abnormal CT Abdomen and Pelvis today. EXAM: ULTRASOUND ABDOMEN LIMITED RIGHT UPPER QUADRANT COMPARISON:  CT Abdomen and Pelvis 0806 hours today. FINDINGS: Suboptimal visualization due to limited right upper quadrant acoustic window. Gallbladder: No sonographic Abigail Abler sign is elicited. The gallbladder is large, partially visible. The gallbladder neck cholelithiasis was better demonstrated by CT. Wall thickness mildly increased at 4-5 mm, although nonspecific in the setting of cirrhosis. No pericholecystic fluid is identified. Common bile duct: Diameter: 6 mm, upper limits of normal. Liver: Nodular liver contour redemonstrated. No discrete liver lesion by ultrasound, although CT today more sensitive. Portal vein is patent on color Doppler imaging with normal direction of blood flow towards the liver. Other: Negative visible right kidney.  No free fluid. IMPRESSION: 1. Suboptimal right upper quadrant visualization by ultrasound. 2. Cholelithiasis better demonstrated by CT. Generalized gallbladder wall thickening up to 4 mm, but absent sonographic Abigail Abler sign suggests this may be due to liver disease rather than to acute cholecystitis. 3. Nodular, Cirrhotic liver again noted (see CT report today). No evidence of bile duct obstruction. Electronically Signed   By: Marlise Simpers M.D.    On: 11/03/2023 09:46   CT ABDOMEN PELVIS W CONTRAST Result Date: 11/03/2023 CLINICAL DATA:  Epigastric pain. Right upper quadrant pain. Nash cirrhosis. Splenomegaly. Evaluate for portal vein thrombosis. EXAM: CT ABDOMEN AND PELVIS WITH CONTRAST TECHNIQUE: Multidetector CT imaging of the abdomen and pelvis was performed using the standard protocol following bolus administration of intravenous contrast. RADIATION DOSE REDUCTION: This exam was performed according to the departmental dose-optimization program which includes automated exposure control, adjustment of the mA and/or kV according to patient size and/or use of iterative reconstruction technique. CONTRAST:  OMNIPAQUE  IOHEXOL  300 MG/ML  SOLN COMPARISON:  12/20/2018.  Ultrasound exam 09/12/2023. FINDINGS: Lower chest: Circumferential wall thickening noted distal esophagus (image film 5/series 2) with marked enhancement after IV contrast administration compatible with varices. Trace right pleural effusion. Hepatobiliary: Subtle nodularity of liver contour compatible with the reported history  of cirrhosis. New subtle subcapsular hypodensity in the posterior left liver measures 16 mm on image 28/2. Gallbladder is distended with ill-defined gallbladder wall is suspicion for mild pericholecystic edema. Multiple calcified gallstones are seen in the neck of the gallbladder and potentially in the cystic duct. No intrahepatic or extrahepatic biliary dilation. Pancreas: No focal mass lesion. No dilatation of the main duct. No intraparenchymal cyst. No peripancreatic edema. Spleen: Spleen measures 18.7 cm craniocaudal length, enlarged. Adrenals/Urinary Tract: No adrenal nodule or mass. Tiny well-defined homogeneous low-density lesions in both kidneys are too small to characterize but are statistically most likely benign and probably cysts. No followup imaging is recommended. Stomach/Bowel: Gastric varices noted in the proximal stomach and esophagogastric  junction. Edema/inflammation surrounds the descending and transverse duodenum. No small bowel wall thickening. No small bowel dilatation. The terminal ileum is normal. The appendix is not well visualized, but there is no edema or inflammation in the region of the cecal tip to suggest appendicitis. Mural edema and thickening is seen in the cecum and ascending colon. Transverse and left colon unremarkable. No wall thickening in the sigmoid colon. Vascular/Lymphatic: There is mild atherosclerotic calcification of the abdominal aorta without aneurysm. Portal vein is enlarged but patent. SMV is patent. Splenic vein is enlarged but patent. There is no gastrohepatic or hepatoduodenal ligament lymphadenopathy. No retroperitoneal or mesenteric lymphadenopathy. No pelvic sidewall lymphadenopathy. Reproductive: The prostate gland and seminal vesicles are unremarkable. Venous anatomy in the left hemiscrotum measures up to 9 mm diameter, compatible with varicocele. Other: Small volume free fluid is identified in the abdomen and pelvis. Edema/inflammation is seen in the extraperitoneal soft tissues of the upper abdomen around the celiac axis and SMA and involving the central small bowel mesentery. Musculoskeletal: No worrisome lytic or sclerotic osseous abnormality. IMPRESSION: 1. Cirrhotic liver morphology with sequela of portal hypertension including splenomegaly, esophageal varices, gastric varices, and small volume ascites. Portal vein is enlarged but patent. 2. New subtle subcapsular hypodensity in the posterior left liver measures 16 mm. This is nonspecific and may be related to the underlying cirrhosis. Follow-up nonemergent outpatient MRI abdomen with and without contrast after resolution of patient's acute symptoms recommended to further evaluate. 3. Cholelithiasis with distended gallbladder and ill-defined gallbladder wall. Multiple calcified gallstones are seen in the neck of the gallbladder and potentially in the  cystic duct. Acute cholecystitis a concern. Right upper quadrant ultrasound recommended to further evaluate. 4. Mural edema and thickening in the cecum and ascending colon. Imaging features are nonspecific and may be related to hepatic insufficiency. Colitis could have this appearance. 5. Duodenal wall is ill-defined and appears thickened with periduodenal edema/inflammation. Edema/inflammation in the extraperitoneal soft tissues of the upper abdomen around the celiac axis and SMA and involving the central small bowel mesentery. Pancreas does not appear to be the epicenter of these changes and duodenitis would be a consideration. Sequelae of portal venous hypertension is also a possibility. 6. Left varicocele. 7.  Aortic Atherosclerosis (ICD10-I70.0). Electronically Signed   By: Donnal Fusi M.D.   On: 11/03/2023 08:39    Procedures Procedures    Medications Ordered in ED Medications  fentaNYL  (SUBLIMAZE ) injection 50 mcg (50 mcg Intravenous Given 11/03/23 0731)  iohexol  (OMNIPAQUE ) 300 MG/ML solution 125 mL (125 mLs Intravenous Contrast Given 11/03/23 0759)    ED Course/ Medical Decision Making/ A&P  Medical Decision Making Amount and/or Complexity of Data Reviewed Labs: ordered. Radiology: ordered.  Risk Prescription drug management.   This patient presents to the ED with chief complaint(s) of epigastric abdominal pain with pertinent past medical history of Florentina Huntsman liver cirrhosis, varices.The complaint involves an extensive differential diagnosis and also carries with it a high risk of complications and morbidity.    The differential diagnosis includes : Pancreatitis, Hepatobiliary pathology including cholelithiasis and cholecystitis, Gastritis/peptic ulcer disease, small bowel obstruction, Acute coronary syndrome, portal vein thrombosis, splenic sequestration, hepatic tumors, splenic mass.  The initial plan is to get basic labs.  We will proceed with CT  abdomen pelvis with contrast given that thrombosis is high in the differential given patient's known history of splenomegaly, varices and therefore likely portal hypertension   Additional history obtained: Additional history obtained from  friend, was at the bedside Records reviewed  GI notes, I also reviewed patient's previous upper endoscopy, right upper quadrant ultrasound from 2022, 2024 and old CT abdomen pelvis without contrast, patient's recent labs  Independent labs interpretation:  The following labs were independently interpreted:  Patient has pancytopenia.  Lipase is normal.  CMP is normal.  Independent visualization and interpretation of imaging: - I independently visualized the following imaging with scope of interpretation limited to determining acute life threatening conditions related to emergency care: CT abdomen and pelvis, which revealed splenomegaly, mild ascites, no free air.  Patient does have enlarged gallbladder per CT scan.  Right upper quadrant ultrasound has been ordered.  Treatment and Reassessment: Patient reassessed after the ultrasound.  The ultrasound is showing cholelithiasis. I discussed with the patient the abnormal finding on the CT, the 16 mm mass.  Patient states that he has a follow-up with GI either later this month or next month.  I have advised that he mentions the CT scan and goes over the CT scan and the mass with the GI doctor.  During reassessment, patient states that his abdominal pain-free now.  Ultrasound reveals cholelithiasis without cholecystitis.  It is unclear if the pain was all hepatic congestion and liver related versus symptomatic cholelithiasis.  For now, plan is continue with outpatient GI follow-up, return precautions have been discussed.  We will also give general surgery follow-up information.   Consideration for admission or further workup: Admission considered, however patient now pain-free.  Labs are reassuring. Will give him  outpatient GI and general surgery follow-up.  Final Clinical Impression(s) / ED Diagnoses Final diagnoses:  Epigastric abdominal pain  Calculus of gallbladder without cholecystitis without obstruction  Cirrhosis of liver with ascites, unspecified hepatic cirrhosis type [facility])  Liver mass    Rx / DC Orders ED Discharge Orders     None         Deatra Face, MD 11/03/23 1020

## 2023-11-05 ENCOUNTER — Telehealth (INDEPENDENT_AMBULATORY_CARE_PROVIDER_SITE_OTHER): Payer: Self-pay | Admitting: Gastroenterology

## 2023-11-05 NOTE — Telephone Encounter (Signed)
 Pt came to front office wanting to let Gayle Kava, NP know he went to [facility] ER over the weekend and asked for her to look over his test results that they did while he was there.

## 2023-11-07 NOTE — Telephone Encounter (Signed)
 Called patient and he states he is doing better and not having any pain now. He wanted to see what chelsea's thoughts are about the results from hospital. ( Should he schedule a hospital follow up to discuss? )

## 2023-11-11 NOTE — Telephone Encounter (Signed)
 Stephen Burch will you schedule patient. He is aware you will be reaching out to him to schedule a hospital follow up. Thanks   Discussed with patient per chelsea - Would recommend scheduling hospital follow up as he will need further imaging (MRI) based on the imaging he had and should have repeat labs in a month or two as well.  Patient verbalized understanding.

## 2023-11-12 ENCOUNTER — Telehealth (INDEPENDENT_AMBULATORY_CARE_PROVIDER_SITE_OTHER): Payer: Self-pay | Admitting: Gastroenterology

## 2023-11-12 NOTE — Telephone Encounter (Signed)
 I spoke with the patient and made him aware per [facility],  He needs repeat liver labs as some of his were elevated. He had a lesion noted on the liver on the CT that they did which may be related to his history of cirrhosis as regular CTs are not sensitive to liver lesions but this will require further dedicated imaging with an MRI  Patient states understanding. You  Stephen Burch, Fidel Huddle, NP

## 2023-11-12 NOTE — Telephone Encounter (Signed)
 I called patient to make his HOS FU OV and he is asking about what kind of test is Joie Narrow wanting to have done. I told him I would have the nurse call him. [phone]

## 2023-11-12 NOTE — Telephone Encounter (Signed)
 Stephen Burch, I seen the note to Strong yesterday regarding him needing an office visit based on recent imaging he may need an MRI. Patient now inquiring what type testing, and why. Please advise.

## 2023-11-20 ENCOUNTER — Other Ambulatory Visit (INDEPENDENT_AMBULATORY_CARE_PROVIDER_SITE_OTHER): Payer: Self-pay | Admitting: Gastroenterology

## 2023-11-21 ENCOUNTER — Other Ambulatory Visit (INDEPENDENT_AMBULATORY_CARE_PROVIDER_SITE_OTHER): Payer: Self-pay | Admitting: Gastroenterology

## 2023-11-21 DIAGNOSIS — R188 Other ascites: Secondary | ICD-10-CM

## 2023-11-26 ENCOUNTER — Ambulatory Visit: Admitting: General Surgery

## 2023-11-26 ENCOUNTER — Other Ambulatory Visit: Payer: Self-pay

## 2023-11-26 ENCOUNTER — Encounter: Payer: Self-pay | Admitting: General Surgery

## 2023-11-26 VITALS — BP 122/75 | HR 64 | Temp 98.6°F | Resp 18 | Ht 72.0 in | Wt 197.0 lb

## 2023-11-26 DIAGNOSIS — I864 Gastric varices: Secondary | ICD-10-CM | POA: Diagnosis not present

## 2023-11-26 DIAGNOSIS — K8020 Calculus of gallbladder without cholecystitis without obstruction: Secondary | ICD-10-CM

## 2023-11-26 DIAGNOSIS — K7460 Unspecified cirrhosis of liver: Secondary | ICD-10-CM | POA: Diagnosis not present

## 2023-11-26 DIAGNOSIS — K7581 Nonalcoholic steatohepatitis (NASH): Secondary | ICD-10-CM

## 2023-11-26 NOTE — Progress Notes (Signed)
 [facility] History and Physical  Reason for Referral: Gallstones  Referring Physician: ED referral   Chief Complaint   New Patient (Initial Visit)     Stephen Burch is a 68 y.o. male.  HPI:   Discussed the use of AI scribe software for clinical note transcription with the patient, who gave verbal consent to proceed.  History of Present Illness Stephen Burch is a 68 year old male with cirrhosis who presents with epigastric pain and gallstones.  He presented to the emergency room with epigastric pain and was worked up and ruled out to have any cardiac issues. A CT and an ultrasound revealed gallstones of the gallbladder but no signs of cholecystitis. He experiences sporadic right upper quadrant pain. No fever, nausea, vomiting, constipation, diarrhea, dysuria, or pain with eating.   He has a history of cirrhosis complicated by esophageal and gastric varices and ascites, which are currently stable. His last MELD score was 7. He is on multiple medications for his liver condition. Bloating is his biggest current complaint/ problem.  He has a history of cardiac arrest due to an anesthesia complication with vancomycin , to which he is allergic.    Past Medical History:  Diagnosis Date   Acute systolic (congestive) heart failure (HCC) 04/30/2018   Arthritis    knees, wrists   Barrett's esophagus    Cardiac arrest (HCC) 02/22/2016   Complication of anesthesia    02-22-2016 intraop cardiac arrest immediately after vancomyocin administration , pt cardiac arrest w/ Vfib,  please refer to anesthesia record in epic   Dysrhythmia    Afib 12/2018 - converted to NSR   Esophageal varices determined by endoscopy (HCC) 11/2016   grade 1   GERD (gastroesophageal reflux disease)    Hiatal hernia    History of adenomatous polyp of colon    History of asthma    as child   History of DVT (deep vein thrombosis)    right lower extremitty post-op right total knee surgery 09/ 2010,   treated w/ coumadin   History of esophageal dilatation 07/1998   for schatzski ring   History of kidney stones    History of staph infection 04/2016   MSSA infection of right total knee w/ sepsis   Hx of cardiac arrest    02-22-2016  intraoperative, immediately following moving pt into prone position and vancomyocin administration, cardiac arrest w/ Vfib (referred to anesthesia record in epic) pt extubated himself next day   Infection of prosthetic right knee joint (HCC)    Liver cirrhosis secondary to NASH (nonalcoholic steatohepatitis) (HCC)    NAFLD-- followed by dr Homero Luster---  liver bx 09-30-2012  mild portal and focal sinusoidal fibrosis   Pre-diabetes    Pyogenic arthritis of right knee joint (HCC) 04/02/2016   Scapholunate advanced collapse of left wrist    deformity   Shock circulatory (HCC)    Staphylococcus aureus bacteremia with sepsis (HCC)    Thrombocytopenia (HCC)    10-09-2017 per pt his platelets have always been low , followed by pcp, never been referred to hematologist    Past Surgical History:  Procedure Laterality Date   APPLICATION OF WOUND VAC Right 02/22/2016   Procedure: APPLICATION OF WOUND VAC;  Surgeon: Christie Cox, MD;  [facility];  Service: Orthopedics;  Laterality: Right;   BIOPSY  02/10/2020   Procedure: BIOPSY;  Surgeon: Ruby Corporal, MD;  [facility];  Service: Endoscopy;;   CARDIAC CATHETERIZATION N/A 02/24/2016  Procedure: Left Heart Cath and Coronary Angiography;  Surgeon: Odie Benne, MD;  Location: [facility] INVASIVE CV LAB;  Service: Cardiovascular;  Laterality: N/A;  No angiographic evidence of CAD,  normal LVSF, ef 50-55%   CARPECTOMY Left 10/17/2017   Procedure: LEFT WRIST PROXIMAL ROW CARPECTOMY WITH POSTEROR IMBROSSEOUS NERVE EXCISION;  Surgeon: Florida Hurter, MD;  Location: [facility] [place];  Service: Orthopedics;  Laterality: Left;  AXILLARY BLOCK   CARPECTOMY WITH RADIAL STYLOIDECTOMY Left 02/11/2019    Procedure: LEFT WRIST RADIAL STYLOIDECTOMY;  Surgeon: Florida Hurter, MD;  Location: [facility] OR;  Service: Orthopedics;  Laterality: Left;   COLONOSCOPY     COLONOSCOPY N/A 10/22/2023   Procedure: COLONOSCOPY;  Surgeon: Urban Garden, MD;  [facility];  Service: Gastroenterology;  Laterality: N/A;  9:45AM;ASA 3   COLONOSCOPY WITH PROPOFOL  N/A 02/10/2020   Procedure: COLONOSCOPY WITH PROPOFOL ;  Surgeon: Ruby Corporal, MD;  [facility];  Service: Endoscopy;  Laterality: N/A;  955   EAR CYST EXCISION Right 09/06/2014   Procedure: OPEN EXCISION BAKER'S CYST RIGHT KNEE;  Surgeon: Christie Cox, MD;  [facility]: [facility];  Service: Orthopedics;  Laterality: Right;   ESOPHAGEAL BANDING  06/30/2020   Procedure: ESOPHAGEAL BANDING;  Surgeon: Ruby Corporal, MD;  [facility];  Service: Endoscopy;;   ESOPHAGEAL DILATION  1996/ 2000   ESOPHAGOGASTRODUODENOSCOPY N/A 09/17/2012   Procedure: ESOPHAGOGASTRODUODENOSCOPY (EGD);  Surgeon: Ruby Corporal, MD;  [facility];  Service: Endoscopy;  Laterality: N/A;  200   ESOPHAGOGASTRODUODENOSCOPY N/A 12/15/2015   Procedure: ESOPHAGOGASTRODUODENOSCOPY (EGD);  Surgeon: Ruby Corporal, MD;  [facility];  Service: Endoscopy;  Laterality: N/A;  1245   ESOPHAGOGASTRODUODENOSCOPY N/A 12/17/2016   Procedure: ESOPHAGOGASTRODUODENOSCOPY (EGD);  Surgeon: Ruby Corporal, MD;  [facility];  Service: Endoscopy;  Laterality: N/A;  210   ESOPHAGOGASTRODUODENOSCOPY N/A 06/30/2020   Procedure: ESOPHAGOGASTRODUODENOSCOPY (EGD);  Surgeon: Ruby Corporal, MD;  [facility];  Service: Endoscopy;  Laterality: N/A;  11:15   ESOPHAGOGASTRODUODENOSCOPY (EGD) WITH PROPOFOL  N/A 02/10/2020   Procedure: ESOPHAGOGASTRODUODENOSCOPY (EGD) WITH PROPOFOL ;  Surgeon: Ruby Corporal, MD;  [facility];  Service: Endoscopy;  Laterality: N/A;   ESOPHAGOGASTRODUODENOSCOPY (EGD) WITH PROPOFOL  N/A 11/04/2020    Procedure: ESOPHAGOGASTRODUODENOSCOPY (EGD) WITH PROPOFOL ;  Surgeon: Urban Garden, MD;  [facility];  Service: Gastroenterology;  Laterality: N/A;  9:45   I & D KNEE WITH POLY EXCHANGE Right 04/02/2016   Procedure: IRRIGATION AND DEBRIDEMENT RIGHT KNEE WITH POLY EXCHANGE;  Surgeon: Christie Cox, MD;  [facility];  Service: Orthopedics;  Laterality: Right;   INCISION AND DRAINAGE HEMATOMA POST LEFT  TOTAL KNEE  2012   INGUINAL HERNIA REPAIR Bilateral 05/22/2013   Procedure: REPAIR OF RECURRENT INCARCERATED INGUINAL HERNIA WITH MESH RIGHT SIDE,  REPAIR OF RECURRENT INGUINAL HERNIA WITH MESH LEFT SIDE;  Surgeon: Levert Ready, MD;  Location: [facility];  Service: General;  Laterality: Bilateral;   INGUINAL HERNIA REPAIR Bilateral 11/1997   INSERTION OF MESH Bilateral 05/22/2013   Procedure: INSERTION OF MESH;  Surgeon: Levert Ready, MD;  [facility]: [facility];  Service: General;  Laterality: Bilateral;   IRRIGATION AND DEBRIDEMENT KNEE Right 02/22/2016   Procedure: IRRIGATION AND DEBRIDEMENT KNEE;  Surgeon: Christie Cox, MD;  Location[facility];  Service: Orthopedics;  Laterality: Right;   Irrigation and Debridement right knee  03/23/2009   Dr. Annabell Key, [facility]   KNEE ARTHROSCOPY W/ SYNOVECTOMY Right  01/30/2010   AND DEBRIDEMENT OF HETEROTOPIC   LIVER BIOPSY  09/30/2012   REVISION TOTAL KNEE ARTHROPLASTY Left fall 2012   TONSILLECTOMY  child   TOTAL KNEE ARTHROPLASTY Bilateral right 09/ 2010/  left 09-18-2010   TRANSTHORACIC ECHOCARDIOGRAM  02/22/2016   ef 30-35% (per cardiac cath 02-24-2016 normal), severe hypokinesis of the mid-apicalanteroseptal and apical myocardium,  grade 1 diastolic dysfunction/ trivial PR and TR   TRIGGER FINGER RELEASE Left 02/11/2019   Procedure: LEFT WRIST STENOSING TENOSYNOVITIS RELEASE;  Surgeon: Florida Hurter, MD;  Location: [facility] OR;  Service: Orthopedics;  Laterality: Left;  MAC WITH AXILLARY BLOCK   WRIST ARTHROPLASTY Left 10/17/2017   Procedure:  CAPITATE RESURFACING ARTHROPLASTY;  Surgeon: Florida Hurter, MD;  Location: [facility];  Service: Orthopedics;  Laterality: Left;    Family History  Problem Relation Age of Onset   Arthritis Father        died age 56   GI Bleed Father        upper GI Bleed, non-ETOH cirrhosis   Arthritis Brother    Diabetes Brother        type 2   Diabetes Paternal Uncle        type 2   Heart attack Maternal Grandmother    Heart attack Maternal Grandfather    Colon cancer Neg Hx    Colon polyps Neg Hx     Social History   Tobacco Use   Smoking status: Former    Current packs/day: 0.00    Average packs/day: 0.3 packs/day for 3.0 years (0.8 ttl pk-yrs)    Types: Cigarettes    Start date: 10/10/1971    Quit date: 10/10/1974    Years since quitting: 49.1    Passive exposure: Past   Smokeless tobacco: Never  Vaping Use   Vaping status: Never Used  Substance Use Topics   Alcohol use: Not Currently   Drug use: No    Medications: I have reviewed the patient's current medications. Allergies as of 11/26/2023       Reactions   Asa [aspirin] Shortness Of Breath   "asthma symptoms"   Penicillins Shortness Of Breath   Has patient had a PCN reaction causing immediate rash, facial/tongue/throat swelling, SOB or lightheadedness with hypotension: Yes Has patient had a PCN reaction causing severe rash involving mucus membranes or skin necrosis: No Has patient had a PCN reaction that required hospitalization No Has patient had a PCN reaction occurring within the last 10 years: No If all of the above answers are "NO", then may proceed with Cephalosporin use. "asthma symptoms" Patient has tolerated cefazolin , ceftriaxo   Vancomycin  Anaphylaxis   Immediately following being turned into prone position and Vancomycin  administration in the OR patient cardiac arrest w/  Vfib.        Medication List        Accurate as of Nov 26, 2023  3:42 PM. If you have any questions, ask your  nurse or doctor.          STOP taking these medications    pantoprazole  40 MG tablet Commonly known as: PROTONIX  Stopped by: Awilda Bogus       TAKE these medications    ferrous sulfate  325 (65 FE) MG tablet Take 1 tablet (325 mg total) by mouth daily with breakfast.   furosemide  40 MG tablet Commonly known as: LASIX  Take 2 tablets by mouth once daily   glipiZIDE  10 MG 24 hr tablet Commonly known as: GLUCOTROL  XL Take 10  mg by mouth daily with breakfast. What changed: Another medication with the same name was removed. Continue taking this medication, and follow the directions you see here. Changed by: Awilda Bogus   methimazole  10 MG tablet Commonly known as: TAPAZOLE  Take 1 tablet (10 mg total) by mouth daily.   Primatene  Mist 0.125 MG/ACT Aero Generic drug: EPINEPHrine  Inhale 1 puff into the lungs daily as needed (asthma).   sildenafil 100 MG tablet Commonly known as: VIAGRA Take 100 mg by mouth daily as needed for erectile dysfunction.   spironolactone  100 MG tablet Commonly known as: ALDACTONE  Take 2 tablets by mouth once daily         ROS:  A comprehensive review of systems was negative except for: Genitourinary: positive for frequency Endocrine: positive for diabetes  Blood pressure 122/75, pulse 64, temperature 98.6 F (37 C), temperature source Oral, resp. rate 18, height 6' (1.829 m), weight 197 lb (89.4 kg), SpO2 97%. Physical Exam Vitals reviewed.  HENT:     Head: Normocephalic.     Nose: Nose normal.  Eyes:     Pupils: Pupils are equal, round, and reactive to light.  Cardiovascular:     Rate and Rhythm: Normal rate.  Pulmonary:     Effort: Pulmonary effort is normal.  Abdominal:     General: There is no distension.     Palpations: Abdomen is soft.     Tenderness: There is no abdominal tenderness.  Musculoskeletal:        General: Normal range of motion.     Cervical back: Normal range of motion.  Skin:    General: Skin  is warm.  Neurological:     General: No focal deficit present.     Mental Status: He is alert.  Psychiatric:        Mood and Affect: Mood normal.     Results: Personally reviewed imaging- gallstones in gallbladder but signs of portal hypertension and cirrhosis  CLINICAL DATA:  68 year old male with right upper quadrant abdominal pain acute onset. Abnormal CT Abdomen and Pelvis today.   EXAM: ULTRASOUND ABDOMEN LIMITED RIGHT UPPER QUADRANT   COMPARISON:  CT Abdomen and Pelvis 0806 hours today.   FINDINGS: Suboptimal visualization due to limited right upper quadrant acoustic window.   Gallbladder:   No sonographic Abigail Abler sign is elicited. The gallbladder is large, partially visible. The gallbladder neck cholelithiasis was better demonstrated by CT. Wall thickness mildly increased at 4-5 mm, although nonspecific in the setting of cirrhosis. No pericholecystic fluid is identified.   Common bile duct:   Diameter: 6 mm, upper limits of normal.   Liver:   Nodular liver contour redemonstrated. No discrete liver lesion by ultrasound, although CT today more sensitive. Portal vein is patent on color Doppler imaging with normal direction of blood flow towards the liver.   Other: Negative visible right kidney.  No free fluid.   IMPRESSION: 1. Suboptimal right upper quadrant visualization by ultrasound. 2. Cholelithiasis better demonstrated by CT. Generalized gallbladder wall thickening up to 4 mm, but absent sonographic Abigail Abler sign suggests this may be due to liver disease rather than to acute cholecystitis. 3. Nodular, Cirrhotic liver again noted (see CT report today). No evidence of bile duct obstruction.     Electronically Signed   By: Marlise Simpers M.D.   On: 11/03/2023 09:46  CLINICAL DATA:  Epigastric pain. Right upper quadrant pain. Nash cirrhosis. Splenomegaly. Evaluate for portal vein thrombosis.   EXAM: CT ABDOMEN AND PELVIS WITH CONTRAST  TECHNIQUE: Multidetector CT imaging of the abdomen and pelvis was performed using the standard protocol following bolus administration of intravenous contrast.   RADIATION DOSE REDUCTION: This exam was performed according to the departmental dose-optimization program which includes automated exposure control, adjustment of the mA and/or kV according to patient size and/or use of iterative reconstruction technique.   CONTRAST:  OMNIPAQUE  IOHEXOL  300 MG/ML  SOLN   COMPARISON:  12/20/2018.  Ultrasound exam 09/12/2023.   FINDINGS: Lower chest: Circumferential wall thickening noted distal esophagus (image film 5/series 2) with marked enhancement after IV contrast administration compatible with varices. Trace right pleural effusion.   Hepatobiliary: Subtle nodularity of liver contour compatible with the reported history of cirrhosis. New subtle subcapsular hypodensity in the posterior left liver measures 16 mm on image 28/2. Gallbladder is distended with ill-defined gallbladder wall is suspicion for mild pericholecystic edema. Multiple calcified gallstones are seen in the neck of the gallbladder and potentially in the cystic duct. No intrahepatic or extrahepatic biliary dilation.   Pancreas: No focal mass lesion. No dilatation of the main duct. No intraparenchymal cyst. No peripancreatic edema.   Spleen: Spleen measures 18.7 cm craniocaudal length, enlarged.   Adrenals/Urinary Tract: No adrenal nodule or mass. Tiny well-defined homogeneous low-density lesions in both kidneys are too small to characterize but are statistically most likely benign and probably cysts. No followup imaging is recommended.   Stomach/Bowel: Gastric varices noted in the proximal stomach and esophagogastric junction. Edema/inflammation surrounds the descending and transverse duodenum. No small bowel wall thickening. No small bowel dilatation. The terminal ileum is normal. The appendix is not well  visualized, but there is no edema or inflammation in the region of the cecal tip to suggest appendicitis. Mural edema and thickening is seen in the cecum and ascending colon. Transverse and left colon unremarkable. No wall thickening in the sigmoid colon.   Vascular/Lymphatic: There is mild atherosclerotic calcification of the abdominal aorta without aneurysm. Portal vein is enlarged but patent. SMV is patent. Splenic vein is enlarged but patent. There is no gastrohepatic or hepatoduodenal ligament lymphadenopathy. No retroperitoneal or mesenteric lymphadenopathy. No pelvic sidewall lymphadenopathy.   Reproductive: The prostate gland and seminal vesicles are unremarkable. Venous anatomy in the left hemiscrotum measures up to 9 mm diameter, compatible with varicocele.   Other: Small volume free fluid is identified in the abdomen and pelvis. Edema/inflammation is seen in the extraperitoneal soft tissues of the upper abdomen around the celiac axis and SMA and involving the central small bowel mesentery.   Musculoskeletal: No worrisome lytic or sclerotic osseous abnormality.   IMPRESSION: 1. Cirrhotic liver morphology with sequela of portal hypertension including splenomegaly, esophageal varices, gastric varices, and small volume ascites. Portal vein is enlarged but patent. 2. New subtle subcapsular hypodensity in the posterior left liver measures 16 mm. This is nonspecific and may be related to the underlying cirrhosis. Follow-up nonemergent outpatient MRI abdomen with and without contrast after resolution of patient's acute symptoms recommended to further evaluate. 3. Cholelithiasis with distended gallbladder and ill-defined gallbladder wall. Multiple calcified gallstones are seen in the neck of the gallbladder and potentially in the cystic duct. Acute cholecystitis a concern. Right upper quadrant ultrasound recommended to further evaluate. 4. Mural edema and thickening in the  cecum and ascending colon. Imaging features are nonspecific and may be related to hepatic insufficiency. Colitis could have this appearance. 5. Duodenal wall is ill-defined and appears thickened with periduodenal edema/inflammation. Edema/inflammation in the extraperitoneal soft tissues of the upper abdomen around the  celiac axis and SMA and involving the central small bowel mesentery. Pancreas does not appear to be the epicenter of these changes and duodenitis would be a consideration. Sequelae of portal venous hypertension is also a possibility. 6. Left varicocele. 7.  Aortic Atherosclerosis (ICD10-I70.0).     Electronically Signed   By: Donnal Fusi M.D.   On: 11/03/2023 08:39 Assessment and Plan: Assessment and Plan Assessment & Plan Cirrhosis with portal hypertension Cirrhosis secondary to fatty liver with compensated liver function (MELD score 7). Portal hypertension present. Surgery risks liver decompensation and bleeding. - Avoid elective surgery unless absolutely necessary.  Cholelithiasis Gallstones but rare RUQ pain not linked to eating. High surgical risk due to cirrhosis and portal hypertension. - Refer to tertiary care center if surgery becomes necessary. - Monitor symptoms; avoid surgery unless pain becomes severe or frequent. - I have sent Dr. Sammi Crick and Gayle Kava NP a message requiring options for surgery if his symptoms worsen.    I apologized to the patient for him being referred and not seeing that would could not do his surgery at [facility] prior to him coming to the clinic.   All questions were answered to the satisfaction of the patient.  Awilda Bogus 11/26/2023, 3:42 PM

## 2023-11-26 NOTE — Patient Instructions (Addendum)
 Follow up with Dr. Sammi Crick and [name] Carlan and decide about any need for surgical referral.

## 2023-11-27 DIAGNOSIS — E1165 Type 2 diabetes mellitus with hyperglycemia: Secondary | ICD-10-CM | POA: Diagnosis not present

## 2023-11-27 DIAGNOSIS — E0590 Thyrotoxicosis, unspecified without thyrotoxic crisis or storm: Secondary | ICD-10-CM | POA: Diagnosis not present

## 2023-11-28 ENCOUNTER — Telehealth (INDEPENDENT_AMBULATORY_CARE_PROVIDER_SITE_OTHER): Payer: Self-pay | Admitting: Gastroenterology

## 2023-11-28 NOTE — Telephone Encounter (Signed)
 Our team was reached by Dr. Collene Dawson to discuss preference for potential referral to a tertiary center for possible cholecystectomy.  The patient had endorsed some upper abdominal pain for which he was seen in the ER and was referred to their office.  I tried to reach the patient via phone call today but could not get a hold of him, left a detailed voice message explaining that it will be important to be seen in our office to explain the potential risks of clinical decompensation and death versus benefits of undergoing elective cholecystectomy in the setting of liver cirrhosis with significant portal hypertension.  He will need to make an appointment with our clinic to discuss this further.  I agree that if he pursues this he will need to be seen at a larger center with liver transplant support such as [facility] or [facility].  Amalia Badder, can you please schedule a follow up appointment for this patient in next available with me or any of the APPs?  Thanks,  Samantha Cress, MD Gastroenterology and Hepatology [facility] Gastroenterology

## 2023-12-02 DIAGNOSIS — R509 Fever, unspecified: Secondary | ICD-10-CM | POA: Diagnosis not present

## 2023-12-02 DIAGNOSIS — J069 Acute upper respiratory infection, unspecified: Secondary | ICD-10-CM | POA: Diagnosis not present

## 2023-12-05 ENCOUNTER — Ambulatory Visit: Admitting: "Endocrinology

## 2023-12-07 NOTE — Telephone Encounter (Signed)
 Thanks. [name], please discuss with him if he is presenting typical symptoms of GB disease. If so and he really needs to have surgical intervention , please discuss there is an increased risk of decompensation and mortality (calculate [place] Vocal score) with elective surgeries, for which he will need to be evaluated at [facility] or [place]. If he is in agreement, we will need to refer him there.

## 2023-12-09 ENCOUNTER — Telehealth: Payer: Self-pay | Admitting: *Deleted

## 2023-12-09 ENCOUNTER — Ambulatory Visit (INDEPENDENT_AMBULATORY_CARE_PROVIDER_SITE_OTHER): Admitting: Gastroenterology

## 2023-12-09 ENCOUNTER — Encounter (INDEPENDENT_AMBULATORY_CARE_PROVIDER_SITE_OTHER): Payer: Self-pay | Admitting: Gastroenterology

## 2023-12-09 VITALS — BP 123/62 | HR 71 | Temp 98.2°F | Ht 72.0 in | Wt 197.2 lb

## 2023-12-09 DIAGNOSIS — K7460 Unspecified cirrhosis of liver: Secondary | ICD-10-CM | POA: Diagnosis not present

## 2023-12-09 DIAGNOSIS — D509 Iron deficiency anemia, unspecified: Secondary | ICD-10-CM

## 2023-12-09 DIAGNOSIS — K769 Liver disease, unspecified: Secondary | ICD-10-CM | POA: Insufficient documentation

## 2023-12-09 DIAGNOSIS — D61818 Other pancytopenia: Secondary | ICD-10-CM

## 2023-12-09 DIAGNOSIS — K8020 Calculus of gallbladder without cholecystitis without obstruction: Secondary | ICD-10-CM | POA: Diagnosis not present

## 2023-12-09 NOTE — Patient Instructions (Signed)
-  We will get you scheduled for liver MRI -Referral to Hematology for low blood counts  -Repeat iron studies -Continue iron pill daily  -Continue lasix  100mg  daily, spironolactone  250mg  daily - Reduce salt intake to <2 g per day - Can take Tylenol  max of 2 g per day (650 mg q8h) for pain - Avoid NSAIDs for pain - Avoid eating raw oysters/shellfish - Ensure every night before going to sleep  Follow up 3 months  It was a pleasure to see you today. I want to create trusting relationships with patients and provide genuine, compassionate, and quality care. I truly value your feedback! please be on the lookout for a survey regarding your visit with me today. I appreciate your input about our visit and your time in completing this!    Stephen Breault L. Ahmani Prehn, MSN, APRN, AGNP-C Adult-Gerontology Nurse Practitioner [facility] Gastroenterology at [facility]

## 2023-12-09 NOTE — Progress Notes (Addendum)
 Referring Provider: Omie Bickers, MD Primary Care Physician:  Omie Bickers, MD Primary GI Physician: Dr. Sammi Crick   Chief Complaint  Patient presents with   Follow-up    Pt arrives for follow up from recent ER visit. Pt went to ER on 11/03/23 due to epigastric pain. Did CT and discovered small mass on liver. Pt states no issues at this time, just tired. Pt has seen Dr.Bridges.     HPI:   Stephen Burch is a 68 y.o. male with past medical history of  NASH cirrhosis complicated by grade 3 esophgeal varices, Type II GOV varices, GERD complicated by Barrett's esophagus without dysplasia, hx of intraoperative cardiac arrest    Patient presenting today for:  Liver lesion in setting of cirrhosis RUQ pain/gallstones IDA/pancytopenia   Last seen march 2025, at that time having some abdmoinal bloating when eating. No swelling to LEs. No signs of HE. Compliant with diuretics, GERD well managed on protonix  40mg  daily. Had started Hep B vaccines with PCP  Recommended update MELD labs, CBC, AFP, continue lasix  80/spironolactone  200mg  daily, consider increase in dose after CMP result, discuss timing of next EGD for EV screening/BE with Dr. Sammi Crick (no further EGD at this time due to no change in management)   Labs in march with hgb 8.8, WBC 2.2, plat 90k, INR 1, AFP 1.5 Iron 19, TIBC 456, sat 4, ferritin 15  Recommended EGD/Colonoscopy-pt refused EGD  Recommended increase in lasix  to 80 mg daily, spironolactone  200mg  daily  He did not repeat CMP after increase in diuretics as advised  ED visit on 11/03/23 for Epigastric pain  CT a/p and US  as outlined below, referred to gen surg regarding Gallstones Labs at that time with  sodium 133 potassium 3.6 creat 0.95, hgb at that time was 9.6, INR 1.3, lipase 50  Seen by Dr. Collene Dawson on 5/27 for gallstones who recommended referral to tertiary center if GB removal needed, she also inquired about recommendations from myself and Dr. Sammi Crick regarding  preferred surgeon recommendations for the patient if this became necessary  Present:  States he had some epigastric pain which prompted him to the proceed to the ED in may. He states pain has subsided, he thinks it was trapped gas. No nausea, vomiting, no changes in appetite. Denies any RUQ pain. Denies constipation or diarrhea. GERD well controlled. He stopped his protonix  when he started iron as he did not want to have to change timing of PPI but no reflux symptoms currently while being off of it. Feeling fatigued. No rectal bleeding or melena. Denies episodes of confusion, jaundice, pruritus, swelling to LEs or abdomen. He is splitting diuretic dose, doing 1/2 in the a.m. and half in the p.m. due to his work schedule though still taking lasix  80mg  and spironolactone  200mg  daily  Reports he has seen hematology in the past due to pancytopenia but last was a few years back, he waited a long time at his last appt and never returned for follow up. Endorses pancytopenia has been ongoing for years.    Cirrhosis related questions: Hematemesis/coffee ground emesis: No Abdominal pain: No Abdominal distention/worsening ascitesNo Fever/chills: No Episodes of confusion/disorientation: No Number of daily bowel movements:at least 3 bowel movements per day Taking diuretics?: Yes, furosemide  80mg  and spironolactone  200 mg qday History of variceal bleeding: No Prior history of banding?: No Prior episodes of SBP: No Last time liver imaging was performed:10/2023 Nodular, Cirrhotic liver again noted (see CT report today). No evidence of bile  duct obstruction. Last AFP:08/2022 2.5 MELD 3.0 score: 10/2023 17 Currently consuming alcohol: No Hepatitis A-immune Hep B not immune   Last Colonoscopy:10/2023 - The examined portion of the ileum was normal.                           - 9 non-bleeding colonic angiodysplastic lesions.                            Treated with argon plasma coagulation (APC).                            - Two 1 to 2 mm polyps in the cecum, removed with a                            cold biopsy forceps. Resected and retrieved.                           - Four 3 to 6 mm polyps in the transverse colon and                            in the cecum, removed with a cold snare. Resected                            and retrieved.                           - Two 3 to 4 mm polyps in the sigmoid colon and in                            the descending colon, removed with a cold snare.                            Resected and retrieved.                           - Rectal varices. CECUM AND TRANSVERSE COLON, POLYPECTOMY:  Tubular adenoma, multiple fragments  Negative for high-grade dysplasia and carcinoma   B. DESCENDING AND SIGMOID COLON, POLYPECTOMY:  Compatible hyperplastic polyps  Negative for dysplasia and carcinoma   Repeat TCS 3 years    Last EGD: 11/04/20 grade III esophageal varices, Type 2 gastroesophageal varices (GOV 2 esophageal varices which extend along the fundus) w/o bleeding, single gastric polyp CT A/P with contrast: 10/2023 . Cirrhotic liver morphology with sequela of portal hypertension including splenomegaly, esophageal varices, gastric varices, and small volume ascites. Portal vein is enlarged but patent. 2. New subtle subcapsular hypodensity in the posterior left liver measures 16 mm. This is nonspecific and may be related to the underlying cirrhosis. Follow-up nonemergent outpatient MRI abdomen with and without contrast after resolution of patient's acute symptoms recommended to further evaluate. 3. Cholelithiasis with distended gallbladder and ill-defined gallbladder wall. Multiple calcified gallstones are seen in the neck of the gallbladder and potentially in the cystic duct. Acute cholecystitis a concern. Right upper quadrant ultrasound recommended to further evaluate. 4. Mural edema and thickening in the cecum and ascending colon. Imaging features  are nonspecific and  may be related to hepatic insufficiency. Colitis could have this appearance. 5. Duodenal wall is ill-defined and appears thickened with periduodenal edema/inflammation. Edema/inflammation in the extraperitoneal soft tissues of the upper abdomen around the celiac axis and SMA and involving the central small bowel mesentery. Pancreas does not appear to be the epicenter of these changes and duodenitis would be a consideration. Sequelae of portal venous hypertension is also a possibility. 6. Left varicocele. 7.  Aortic Atherosclerosis (ICD10-I70.0).  RUQ US : 10/2023  Suboptimal right upper quadrant visualization by ultrasound. 2. Cholelithiasis better demonstrated by CT. Generalized gallbladder wall thickening up to 4 mm, but absent sonographic Abigail Abler sign suggests this may be due to liver disease rather than to acute cholecystitis. 3. Nodular, Cirrhotic liver again noted (see CT report today). No evidence of bile duct obstruction.  Filed Weights   12/09/23 0943  Weight: 197 lb 3.2 oz (89.4 kg)     Past Medical History:  Diagnosis Date   Acute systolic (congestive) heart failure (HCC) 04/30/2018   Arthritis    knees, wrists   Barrett's esophagus    Cardiac arrest (HCC) 02/22/2016   Complication of anesthesia    02-22-2016 intraop cardiac arrest immediately after vancomyocin administration , pt cardiac arrest w/ Vfib,  please refer to anesthesia record in epic   Dysrhythmia    Afib 12/2018 - converted to NSR   Esophageal varices determined by endoscopy (HCC) 11/2016   grade 1   GERD (gastroesophageal reflux disease)    Hiatal hernia    History of adenomatous polyp of colon    History of asthma    as child   History of DVT (deep vein thrombosis)    right lower extremitty post-op right total knee surgery 09/ 2010,  treated w/ coumadin   History of esophageal dilatation 07/1998   for schatzski ring   History of kidney stones    History of staph infection 04/2016   MSSA infection  of right total knee w/ sepsis   Hx of cardiac arrest    02-22-2016  intraoperative, immediately following moving pt into prone position and vancomyocin administration, cardiac arrest w/ Vfib (referred to anesthesia record in epic) pt extubated himself next day   Infection of prosthetic right knee joint (HCC)    Liver cirrhosis secondary to NASH (nonalcoholic steatohepatitis) (HCC)    NAFLD-- followed by dr Homero Luster---  liver bx 09-30-2012  mild portal and focal sinusoidal fibrosis   Pre-diabetes    Pyogenic arthritis of right knee joint (HCC) 04/02/2016   Scapholunate advanced collapse of left wrist    deformity   Shock circulatory (HCC)    Staphylococcus aureus bacteremia with sepsis (HCC)    Thrombocytopenia (HCC)    10-09-2017 per pt his platelets have always been low , followed by pcp, never been referred to hematologist    Past Surgical History:  Procedure Laterality Date   APPLICATION OF WOUND VAC Right 02/22/2016   Procedure: APPLICATION OF WOUND VAC;  Surgeon: Christie Cox, MD;  Location: [facility];  Service: Orthopedics;  Laterality: Right;   BIOPSY  02/10/2020   Procedure: BIOPSY;  Surgeon: Ruby Corporal, MD;  [facility];  Service: Endoscopy;;   CARDIAC CATHETERIZATION N/A 02/24/2016   Procedure: Left Heart Cath and Coronary Angiography;  Surgeon: Odie Benne, MD;  Location: [facility] INVASIVE CV LAB;  Service: Cardiovascular;  Laterality: N/A;  No angiographic evidence of CAD,  normal LVSF, ef 50-55%   CARPECTOMY Left 10/17/2017  Procedure: LEFT WRIST PROXIMAL ROW CARPECTOMY WITH POSTEROR IMBROSSEOUS NERVE EXCISION;  Surgeon: Florida Hurter, MD;  Location: [facility];  Service: Orthopedics;  Laterality: Left;  AXILLARY BLOCK   CARPECTOMY WITH RADIAL STYLOIDECTOMY Left 02/11/2019   Procedure: LEFT WRIST RADIAL STYLOIDECTOMY;  Surgeon: Florida Hurter, MD;  Location: [facility] OR;  Service: Orthopedics;  Laterality: Left;   COLONOSCOPY     COLONOSCOPY N/A  10/22/2023   Procedure: COLONOSCOPY;  Surgeon: Urban Garden, MD;  [facility];  Service: Gastroenterology;  Laterality: N/A;  9:45AM;ASA 3   COLONOSCOPY WITH PROPOFOL  N/A 02/10/2020   Procedure: COLONOSCOPY WITH PROPOFOL ;  Surgeon: Ruby Corporal, MD;  [facility];  Service: Endoscopy;  Laterality: N/A;  955   EAR CYST EXCISION Right 09/06/2014   Procedure: OPEN EXCISION BAKER'S CYST RIGHT KNEE;  Surgeon: Christie Cox, MD;  Location: [facility];  Service: Orthopedics;  Laterality: Right;   ESOPHAGEAL BANDING  06/30/2020   Procedure: ESOPHAGEAL BANDING;  Surgeon: Ruby Corporal, MD;  [facility];  Service: Endoscopy;;   ESOPHAGEAL DILATION  1996/ 2000   ESOPHAGOGASTRODUODENOSCOPY N/A 09/17/2012   Procedure: ESOPHAGOGASTRODUODENOSCOPY (EGD);  Surgeon: Ruby Corporal, MD;  [facility];  Service: Endoscopy;  Laterality: N/A;  200   ESOPHAGOGASTRODUODENOSCOPY N/A 12/15/2015   Procedure: ESOPHAGOGASTRODUODENOSCOPY (EGD);  Surgeon: Ruby Corporal, MD;  [facility];  Service: Endoscopy;  Laterality: N/A;  1245   ESOPHAGOGASTRODUODENOSCOPY N/A 12/17/2016   Procedure: ESOPHAGOGASTRODUODENOSCOPY (EGD);  Surgeon: Ruby Corporal, MD;  [facility];  Service: Endoscopy;  Laterality: N/A;  210   ESOPHAGOGASTRODUODENOSCOPY N/A 06/30/2020   Procedure: ESOPHAGOGASTRODUODENOSCOPY (EGD);  Surgeon: Ruby Corporal, MD;  [facility];  Service: Endoscopy;  Laterality: N/A;  11:15   ESOPHAGOGASTRODUODENOSCOPY (EGD) WITH PROPOFOL  N/A 02/10/2020   Procedure: ESOPHAGOGASTRODUODENOSCOPY (EGD) WITH PROPOFOL ;  Surgeon: Ruby Corporal, MD;  [facility];  Service: Endoscopy;  Laterality: N/A;   ESOPHAGOGASTRODUODENOSCOPY (EGD) WITH PROPOFOL  N/A 11/04/2020   Procedure: ESOPHAGOGASTRODUODENOSCOPY (EGD) WITH PROPOFOL ;  Surgeon: Urban Garden, MD;  [facility];  Service: Gastroenterology;  Laterality: N/A;   9:45   I & D KNEE WITH POLY EXCHANGE Right 04/02/2016   Procedure: IRRIGATION AND DEBRIDEMENT RIGHT KNEE WITH POLY EXCHANGE;  Surgeon: Christie Cox, MD;  Location: [facility];  Service: Orthopedics;  Laterality: Right;   INCISION AND DRAINAGE HEMATOMA POST LEFT  TOTAL KNEE  2012   INGUINAL HERNIA REPAIR Bilateral 05/22/2013   Procedure: REPAIR OF RECURRENT INCARCERATED INGUINAL HERNIA WITH MESH RIGHT SIDE,  REPAIR OF RECURRENT INGUINAL HERNIA WITH MESH LEFT SIDE;  Surgeon: Levert Ready, MD;  Location: [facility] OR;  Service: General;  Laterality: Bilateral;   INGUINAL HERNIA REPAIR Bilateral 11/1997   INSERTION OF MESH Bilateral 05/22/2013   Procedure: INSERTION OF MESH;  Surgeon: Levert Ready, MD;  Location: [facility];  Service: General;  Laterality: Bilateral;   IRRIGATION AND DEBRIDEMENT KNEE Right 02/22/2016   Procedure: IRRIGATION AND DEBRIDEMENT KNEE;  Surgeon: Christie Cox, MD;  Location: [facility];  Service: Orthopedics;  Laterality: Right;   Irrigation and Debridement right knee  03/23/2009   Dr. Annabell Key, [facility]   KNEE ARTHROSCOPY W/ SYNOVECTOMY Right 01/30/2010   AND DEBRIDEMENT OF HETEROTOPIC   LIVER BIOPSY  09/30/2012   REVISION TOTAL KNEE ARTHROPLASTY Left fall 2012   TONSILLECTOMY  child   TOTAL KNEE ARTHROPLASTY Bilateral right 09/ 2010/  left 09-18-2010   TRANSTHORACIC ECHOCARDIOGRAM  02/22/2016   ef 30-35% (per cardiac cath 02-24-2016 normal), severe hypokinesis of the mid-apicalanteroseptal and apical myocardium,  grade 1 diastolic dysfunction/ trivial PR and TR   TRIGGER FINGER RELEASE Left 02/11/2019   Procedure: LEFT WRIST STENOSING TENOSYNOVITIS RELEASE;  Surgeon: Florida Hurter, MD;  [facility] OR;  Service: Orthopedics;  Laterality: Left;  MAC WITH AXILLARY BLOCK   WRIST ARTHROPLASTY Left 10/17/2017   Procedure: CAPITATE RESURFACING ARTHROPLASTY;  Surgeon: Florida Hurter, MD;  Location: [facility];  Service: Orthopedics;  Laterality: Left;    Current Outpatient  Medications  Medication Sig Dispense Refill   EPINEPHrine  (PRIMATENE  MIST) 0.125 MG/ACT AERO Inhale 1 puff into the lungs daily as needed (asthma).     ferrous sulfate  325 (65 FE) MG tablet Take 1 tablet (325 mg total) by mouth daily with breakfast. 60 tablet 1   furosemide  (LASIX ) 40 MG tablet Take 2 tablets by mouth once daily 180 tablet 0   glipiZIDE  (GLUCOTROL  XL) 10 MG 24 hr tablet Take 10 mg by mouth daily with breakfast.     methimazole  (TAPAZOLE ) 10 MG tablet Take 1 tablet (10 mg total) by mouth daily. 90 tablet 1   sildenafil (VIAGRA) 100 MG tablet Take 100 mg by mouth daily as needed for erectile dysfunction.     spironolactone  (ALDACTONE ) 100 MG tablet Take 2 tablets by mouth once daily 180 tablet 0   No current facility-administered medications for this visit.    Allergies as of 12/09/2023 - Review Complete 12/09/2023  Allergen Reaction Noted   Asa [aspirin] Shortness Of Breath 04/01/2016   Penicillins Shortness Of Breath 07/04/2012   Vancomycin  Anaphylaxis 02/22/2016    Social History   Socioeconomic History   Marital status: Legally Separated    Spouse name: Not on file   Number of children: 2   Years of education: Not on file   Highest education level: 12th grade  Occupational History    Employer: [employer]    Comment: part time  Tobacco Use   Smoking status: Former    Current packs/day: 0.00    Average packs/day: 0.3 packs/day for 3.0 years (0.8 ttl pk-yrs)    Types: Cigarettes    Start date: 10/10/1971    Quit date: 10/10/1974    Years since quitting: 49.1    Passive exposure: Past   Smokeless tobacco: Never  Vaping Use   Vaping status: Never Used  Substance and Sexual Activity   Alcohol use: Not Currently   Drug use: No   Sexual activity: Yes  Other Topics Concern   Not on file  Social History Narrative   ** Merged History Encounter **       Pt lives alone.   Social Drivers of Corporate investment banker Strain: Low Risk  (11/16/2019)   Overall  Financial Resource Strain (CARDIA)    Difficulty of Paying Living Expenses: Not hard at all  Food Insecurity: No Food Insecurity (02/28/2021)   Hunger Vital Sign    Worried About Running Out of Food in the Last Year: Never true    Ran Out of Food in the Last Year: Never true  Transportation Needs: No Transportation Needs (02/28/2021)   PRAPARE - Administrator, Civil Service (Medical): No    Lack of Transportation (Non-Medical): No  Physical Activity: Inactive (11/16/2019)   Exercise Vital Sign    Days of Exercise per Week: 0 days    Minutes of Exercise per Session: 0 min  Stress: No Stress  Concern Present (11/16/2019)   [employer] of Occupational Health - Occupational Stress Questionnaire    Feeling of Stress : Not at all  Social Connections: Moderately Isolated (11/16/2019)   Social Connection and Isolation Panel [NHANES]    Frequency of Communication with Friends and Family: More than three times a week    Frequency of Social Gatherings with Friends and Family: More than three times a week    Attends Religious Services: More than 4 times per year    Active Member of [employer] or Organizations: No    Attends Engineer, structural: Never    Marital Status: Separated    Review of systems General: negative for night sweats, fever, chills, weight loss +fatigue  Neck: Negative for lumps, goiter, pain and significant neck swelling Resp: Negative for cough, wheezing, dyspnea at rest CV: Negative for chest pain, leg swelling, palpitations, orthopnea GI: denies melena, hematochezia, nausea, vomiting, diarrhea, constipation, dysphagia, odyonophagia, early satiety or unintentional weight loss.  MSK: Negative for joint pain or swelling, back pain, and muscle pain. Derm: Negative for itching or rash Psych: Denies depression, anxiety, memory loss, confusion. No homicidal or suicidal ideation.  Heme: Negative for prolonged bleeding, bruising easily, and swollen  nodes. Endocrine: Negative for cold or heat intolerance, polyuria, polydipsia and goiter. Neuro: negative for tremor, gait imbalance, syncope and seizures. The remainder of the review of systems is noncontributory.  Physical Exam: BP 123/62   Pulse 71   Temp 98.2 F (36.8 C)   Ht 6' (1.829 m)   Wt 197 lb 3.2 oz (89.4 kg)   BMI 26.75 kg/m  General:   Alert and oriented. No distress noted. Pleasant and cooperative.  Head:  Normocephalic and atraumatic. Eyes:  Conjuctiva clear without scleral icterus. Mouth:  Oral mucosa pink and moist. Good dentition. No lesions. Heart: Normal rate and rhythm, s1 and s2 heart sounds present.  Lungs: Clear lung sounds in all lobes. Respirations equal and unlabored. Abdomen:  +BS,  non-tender, mildly full but soft, non taut.  No rebound or guarding. No HSM or masses noted. Derm: No palmar erythema or jaundice Msk:  Symmetrical without gross deformities. Normal posture. Extremities:  Without edema. Neurologic:  Alert and  oriented x4. No asterixis  Psych:  Alert and cooperative. Normal mood and affect.  Invalid input(s): 6 MONTHS   ASSESSMENT: Stephen Burch is a 68 y.o. male presenting today for follow up of gallstones, liver lesion in setting of cirrhosis, IDA, pancytopenia  Gallstones: recent US  with gallstones/generalized GB wall thickening ( up to 4mm) after he presented to the ED with Epigastric pain. no further epigastric pain. He denies any RUQ Pain, nausea or vomiting.Aaron Aas He was seen By Dr. Collene Dawson who deferred surgical intervention given his decompensated liver cirrhosis and high surgical complication risks. At this time he fortunately has no biliary symptoms, we discussed if he were to present with symptoms that suggested need for cholecystectomy, he would need to be referred to tertiary center for further evaluation as a surgery would put him at high risk for decompensation.  VOCAL penn score: 15.5% 90 day decompensation, 1.8% 30 day  mortality, 2.9% 90 day mortality   Liver lesion in setting of cirrhosis: recent AFP 2.5 in march. RUQ US  in march without focal lesions. CT A/P with contrast in may during ED visit with sub capsular hypodensity in posterior let liver, 16mm, unclear if this is secondary to cirrhotic morphology or potential concerning lesion. I am recommended MRI liver w and wo  contrast for further evaluation.  In regards to his cirrhosis, he has presented with some ascites previously which is now well managed on diuretics. History of EVs/Rectal/gastric varices, did not tolerate NSBB in the past and has refused TIPS evaluation despite multiple discussions. No previous bleeding events, no HE. Last MELD 3.0 in May was 17. Will proceed with MRI of the liver for further determination regarding liver lesion seen on CT.    IDA/Pancytopenia: recent iron levels low, started on PO iron. Colonoscopy as outlined above with non bleeding angiodysplastic lesions, treated with APC, a few polyps and rectal varices. Patient declined recommended EGD, he has known gastric and esophageal varices, now rectal varies as noted on TCS. Denies rectal bleeding or melena. Has declined to undergo evaluation for TIPS despite multiple discussions. He reports ongoing pancytopenia for years, previously saw hematology but was lost to follow up with them. At this time, recommend continuing PO iron, repeat Iron studies and recommend hematology referral given pancytopenia. He is aware any rectal bleeding, melena, hematemesis, coffee ground emesis in setting of varices is a medical emergency and requires immediate ER evaluation.    PLAN:  -MR liver w wo -Referral to Hematology for IDA/pancytopenia -Repeat iron studies -Continue iron PO daily -continue lasix  80mg , spironolactone  200mg  daily  -Repeat MELD labs/CBC/INR November - Reduce salt intake to <2 g per day - Can take Tylenol  max of 2 g per day (650 mg q8h) for pain - Avoid NSAIDs for pain - Avoid  eating raw oysters/shellfish - Ensure every night before going to sleep  All questions were answered, patient verbalized understanding and is in agreement with plan as outlined above.    Follow Up: 3 months   Tessa Seaberry L. Adrien Alberta, MSN, APRN, AGNP-C Adult-Gerontology Nurse Practitioner [facility]  I have reviewed the note and agree with the APP's assessment as described in this progress note  Samantha Cress, MD Gastroenterology and Hepatology [place] [facility] Gastroenterology

## 2023-12-09 NOTE — Telephone Encounter (Signed)
 Called pt, LMOVM with MRI appt details.

## 2023-12-10 ENCOUNTER — Ambulatory Visit (INDEPENDENT_AMBULATORY_CARE_PROVIDER_SITE_OTHER): Payer: Self-pay | Admitting: Gastroenterology

## 2023-12-10 DIAGNOSIS — D485 Neoplasm of uncertain behavior of skin: Secondary | ICD-10-CM | POA: Diagnosis not present

## 2023-12-10 DIAGNOSIS — L570 Actinic keratosis: Secondary | ICD-10-CM | POA: Diagnosis not present

## 2023-12-10 DIAGNOSIS — L280 Lichen simplex chronicus: Secondary | ICD-10-CM | POA: Diagnosis not present

## 2023-12-10 LAB — IRON,TIBC AND FERRITIN PANEL
%SAT: 14 % — ABNORMAL LOW (ref 20–48)
Ferritin: 56 ng/mL (ref 24–380)
Iron: 51 ug/dL (ref 50–180)
TIBC: 360 ug/dL (ref 250–425)

## 2023-12-12 ENCOUNTER — Inpatient Hospital Stay: Attending: Hematology | Admitting: Hematology

## 2023-12-12 ENCOUNTER — Inpatient Hospital Stay

## 2023-12-12 VITALS — BP 113/64 | HR 74 | Temp 98.2°F | Resp 18 | Ht 70.47 in | Wt 198.9 lb

## 2023-12-12 DIAGNOSIS — Z87891 Personal history of nicotine dependence: Secondary | ICD-10-CM | POA: Diagnosis not present

## 2023-12-12 DIAGNOSIS — D696 Thrombocytopenia, unspecified: Secondary | ICD-10-CM | POA: Insufficient documentation

## 2023-12-12 DIAGNOSIS — Z86718 Personal history of other venous thrombosis and embolism: Secondary | ICD-10-CM | POA: Diagnosis not present

## 2023-12-12 DIAGNOSIS — R162 Hepatomegaly with splenomegaly, not elsewhere classified: Secondary | ICD-10-CM | POA: Diagnosis not present

## 2023-12-12 DIAGNOSIS — D509 Iron deficiency anemia, unspecified: Secondary | ICD-10-CM | POA: Diagnosis not present

## 2023-12-12 DIAGNOSIS — D61818 Other pancytopenia: Secondary | ICD-10-CM

## 2023-12-12 DIAGNOSIS — K7460 Unspecified cirrhosis of liver: Secondary | ICD-10-CM | POA: Insufficient documentation

## 2023-12-12 LAB — RETICULOCYTES
Immature Retic Fract: 14.8 % (ref 2.3–15.9)
RBC.: 3.99 MIL/uL — ABNORMAL LOW (ref 4.22–5.81)
Retic Count, Absolute: 130.1 10*3/uL (ref 19.0–186.0)
Retic Ct Pct: 3.3 % — ABNORMAL HIGH (ref 0.4–3.1)

## 2023-12-12 LAB — CBC WITH DIFFERENTIAL/PLATELET
Abs Immature Granulocytes: 0.01 10*3/uL (ref 0.00–0.07)
Basophils Absolute: 0 10*3/uL (ref 0.0–0.1)
Basophils Relative: 1 %
Eosinophils Absolute: 0.3 10*3/uL (ref 0.0–0.5)
Eosinophils Relative: 8 %
HCT: 36 % — ABNORMAL LOW (ref 39.0–52.0)
Hemoglobin: 12.2 g/dL — ABNORMAL LOW (ref 13.0–17.0)
Immature Granulocytes: 0 %
Lymphocytes Relative: 9 %
Lymphs Abs: 0.3 10*3/uL — ABNORMAL LOW (ref 0.7–4.0)
MCH: 31 pg (ref 26.0–34.0)
MCHC: 33.9 g/dL (ref 30.0–36.0)
MCV: 91.4 fL (ref 80.0–100.0)
Monocytes Absolute: 0.4 10*3/uL (ref 0.1–1.0)
Monocytes Relative: 10 %
Neutro Abs: 2.5 10*3/uL (ref 1.7–7.7)
Neutrophils Relative %: 72 %
Platelets: 94 10*3/uL — ABNORMAL LOW (ref 150–400)
RBC: 3.94 MIL/uL — ABNORMAL LOW (ref 4.22–5.81)
RDW: 16.4 % — ABNORMAL HIGH (ref 11.5–15.5)
WBC: 3.4 10*3/uL — ABNORMAL LOW (ref 4.0–10.5)
nRBC: 0 % (ref 0.0–0.2)

## 2023-12-12 LAB — IRON AND TIBC
Iron: 60 ug/dL (ref 45–182)
Saturation Ratios: 15 % — ABNORMAL LOW (ref 17.9–39.5)
TIBC: 389 ug/dL (ref 250–450)
UIBC: 329 ug/dL

## 2023-12-12 LAB — DIRECT ANTIGLOBULIN TEST (NOT AT ARMC)
DAT, IgG: POSITIVE
DAT, complement: POSITIVE

## 2023-12-12 LAB — FERRITIN: Ferritin: 40 ng/mL (ref 24–336)

## 2023-12-12 LAB — LACTATE DEHYDROGENASE: LDH: 197 U/L — ABNORMAL HIGH (ref 98–192)

## 2023-12-12 LAB — FOLATE: Folate: >40 ng/mL (ref >5.9)

## 2023-12-12 LAB — VITAMIN B12: Vitamin B-12: 463 pg/mL (ref 180–914)

## 2023-12-12 NOTE — Progress Notes (Signed)
 [facility] [address] [place] [address]   Clinic Day:  12/12/2023  Referring physician: Patterson Bora, NP  Patient Care Team: Omie Bickers, MD as PCP - General (Internal Medicine) Florida Hurter, MD as Consulting Physician (Orthopedic Surgery) Berton Brock, MD as Consulting Physician (Internal Medicine) Ruby Corporal, MD (Inactive) as Consulting Physician (Gastroenterology)   ASSESSMENT & PLAN:   Assessment: 1.  Pancytopenia: - History of leukopenia and thrombocytopenia over the last decade. - History of Nash cirrhosis and splenomegaly. - Worsening anemia in the last 1 year with hemoglobin between 8-9. - Started on iron tablet daily from 1 April. - Latest ferritin was 56 with saturation 14 on 12/09/2023. - CTAP on 11/03/2023: Splenomegaly measuring 18.7 cm.  New subtle subcapsular hypodensity in the posterior left liver measures 16 mm.  Cirrhotic liver morphology. - Denies BRBPR/melena.   2.  Provoked DVT: - He had a deep vein thrombosis of the lower extremity after knee surgery 12 years ago.  He was on limited duration anticoagulation.  No recurrence noted.  3. Social/Family History: -No tobacco use. No ETOH. No chemical exposure.  -No family history of leukemia. Sister had cancer, some type of GI.   Plan:  1.  Pancytopenia: - Most likely etiology is splenomegaly. - Will check for nutritional deficiencies, hemolysis, bone marrow infiltrative process. - Will also check flow cytometry for LGL leukemia.  Will send myeloid NGS panel. - We talked about parenteral iron therapy with Monoferric.  We talked about rare chance of anaphylactic reactions.  We will schedule it. - RTC 6 weeks with repeat CBC, ferritin and iron panel.  We will also discussed the lab results from today at his follow-up visit.  2.  Hypodensity in the left liver: - Last AFP in March was normal. - He is scheduled for liver MRI.   Orders Placed This Encounter   Procedures   CBC with Differential    Standing Status:   Future    Number of Occurrences:   1    Expected Date:   12/12/2023    Expiration Date:   03/11/2024   Reticulocytes    Standing Status:   Future    Number of Occurrences:   1    Expected Date:   12/12/2023    Expiration Date:   03/11/2024   Lactate dehydrogenase    Standing Status:   Future    Number of Occurrences:   1    Expected Date:   12/12/2023    Expiration Date:   03/11/2024   Haptoglobin    Standing Status:   Future    Number of Occurrences:   1    Expected Date:   12/12/2023    Expiration Date:   03/11/2024   Iron and TIBC ([facility] ONLY)    Standing Status:   Future    Number of Occurrences:   1    Expected Date:   12/12/2023    Expiration Date:   03/11/2024   Ferritin    Standing Status:   Future    Number of Occurrences:   1    Expected Date:   12/12/2023    Expiration Date:   03/11/2024   Vitamin B12    Standing Status:   Future    Number of Occurrences:   1    Expected Date:   12/12/2023    Expiration Date:   03/11/2024   Folate    Standing Status:  Future    Number of Occurrences:   1    Expected Date:   12/12/2023    Expiration Date:   03/11/2024   Copper , serum    Standing Status:   Future    Number of Occurrences:   1    Expected Date:   12/12/2023    Expiration Date:   03/11/2024   Kappa/lambda light chains    Standing Status:   Future    Number of Occurrences:   1    Expected Date:   12/12/2023    Expiration Date:   03/11/2024   Immunofixation electrophoresis    Standing Status:   Future    Number of Occurrences:   1    Expected Date:   12/12/2023    Expiration Date:   03/11/2024   Protein electrophoresis, serum    Standing Status:   Future    Number of Occurrences:   1    Expected Date:   12/12/2023    Expiration Date:   03/11/2024   Miscellaneous [facility] test (send-out)    Standing Status:   Future    Number of Occurrences:   1    Expected Date:   12/12/2023    Expiration  Date:   03/11/2024    Test name / description::   Myeloid NGS: test code [id number]   CBC with Differential    Standing Status:   Future    Expected Date:   01/23/2024    Expiration Date:   04/22/2024   Iron and TIBC ([facility] ONLY)    Standing Status:   Future    Expected Date:   01/23/2024    Expiration Date:   04/22/2024   Ferritin    Standing Status:   Future    Expected Date:   01/23/2024    Expiration Date:   04/22/2024   Methylmalonic acid, serum    Standing Status:   Future    Number of Occurrences:   1    Expected Date:   12/12/2023    Expiration Date:   12/11/2024   Direct antiglobulin test    Standing Status:   Future    Number of Occurrences:   1    Expected Date:   12/12/2023    Expiration Date:   03/11/2024      Hurman Maiden R Teague,acting as a scribe for Paulett Boros, MD.,have documented all relevant documentation on the behalf of Paulett Boros, MD,as directed by  Paulett Boros, MD while in the presence of Paulett Boros, MD.   I, Paulett Boros MD, have reviewed the above documentation for accuracy and completeness, and I agree with the above.   Paulett Boros, MD   6/12/202510:12 AM  CHIEF COMPLAINT/PURPOSE OF CONSULT:   Diagnosis: Pancytopenia and liver hypodensity  Current Therapy: Monoferric  HISTORY OF PRESENT ILLNESS:   Stephen Burch is a 68 y.o. male presenting to clinic today for evaluation of pancytopenia at the request of Carlan, Fidel Huddle, NP.  Patient has a medical history of NASH liver cirrhosis, gastric varices, esophageal varices, rectal varices, Barrett's esophagus, DM type II, and hyperthyroidism.   Gordan was seen by his gastroenterologist on 12/09/23 after an ED visit on 11/03/23 for epigastric pain with CT finding a liver mass. At this visit, he reported a history of pancytopenia spanning multiple years and has seen me for this but did not follow-up. He was recommended to continue oral iron supplements  at GI visit, which he began in April 2025. His last visit  with me was on 12/31/2018 and lab work was done after this visit for workup of etiology of pancytopenia was done, including B12, folic acid , methylmalonic acid, copper  levels, LDH, SPEP, and flow cytometry of blood. At that time, LDH was elevated at 208 and other blood work was normal.   His most recent CBC diff from 11/03/23 showed low WBC at 3.4, low RBC at 3.32, low HGB at 9.6, low HCT at 29.9, elevated RDW at 21.5, low platelets at 81, and low absolute lymphocytes at 0.1.   Iron and ferritin panel from 12/09/23 showed normal ferritin at 56, normal iron at 51, and low iron saturation at 14.   CT AP on 11/03/23 showed: Cirrhotic liver morphology with sequela of portal hypertension including splenomegaly, esophageal varices, gastric varices, and small volume ascites. Portal vein is enlarged but patent. New subtle subcapsular hypodensity in the posterior left liver measures 16 mm. This is nonspecific and may be related to the underlying cirrhosis. Cholelithiasis with distended gallbladder and ill-defined gallbladder wall. Multiple calcified gallstones are seen in the neck of the gallbladder and potentially in the cystic duct. Acute cholecystitis a concern. Mural edema and thickening in the cecum and ascending colon. Imaging features are nonspecific and may be related to hepatic insufficiency. Colitis could have this appearance. Duodenal wall is ill-defined and appears thickened with periduodenal edema/inflammation. Edema/inflammation in the extraperitoneal soft tissues of the upper abdomen around the celiac axis and SMA and involving the central small bowel mesentery. Pancreas does not appear to be the epicenter of these changes and duodenitis would be a consideration. Sequelae of portal venous hypertension is also a possibility. Left varicocele. Aortic Atherosclerosis.   Tareq also had US  of RUQ abdomen done on 11/03/23 which found: Suboptimal right upper  quadrant visualization by ultrasound. Cholelithiasis better demonstrated by CT. Generalized gallbladder wall thickening up to 4 mm, but absent sonographic Abigail Abler sign suggests this may be due to liver disease rather than to acute cholecystitis. Nodular, Cirrhotic liver. No evidence of bile duct obstruction.  He has an MRI of the liver scheduled next week.   His last colonoscopy was on 10/22/2023 that found 9 non-bleeding angiodysplastic lesion (treated with APC), two 1-2 mm polyps in the cecum, four 3-6 mm polyps in the transverse colon and cecum, two 3-4 mm polyps in the sigmoid colon and descending colon, and rectal varices.   Today, he states that he is doing well overall. His appetite level is at 100%. His energy level is at 25%.  Atharva denies any infections in the last 6 months, BRBPR, melena, epistaxis, easily bruising/bleeding, or other bleeding issues. He notes a rash on the back 1 month ago and saw dermatology.   He denies any constipation due to oral iron supplements.   Samaj reports a history of splenomegaly with a spleen size of 19 cm in the past.   PAST MEDICAL HISTORY:   Past Medical History: Past Medical History:  Diagnosis Date   Acute systolic (congestive) heart failure (HCC) 04/30/2018   Arthritis    knees, wrists   Barrett's esophagus    Cardiac arrest (HCC) 02/22/2016   Complication of anesthesia    02-22-2016 intraop cardiac arrest immediately after vancomyocin administration , pt cardiac arrest w/ Vfib,  please refer to anesthesia record in epic   Dysrhythmia    Afib 12/2018 - converted to NSR   Esophageal varices determined by endoscopy (HCC) 11/2016   grade 1   GERD (gastroesophageal reflux disease)    Hiatal hernia  History of adenomatous polyp of colon    History of asthma    as child   History of DVT (deep vein thrombosis)    right lower extremitty post-op right total knee surgery 09/ 2010,  treated w/ coumadin   History of esophageal dilatation  07/1998   for schatzski ring   History of kidney stones    History of staph infection 04/2016   MSSA infection of right total knee w/ sepsis   Hx of cardiac arrest    02-22-2016  intraoperative, immediately following moving pt into prone position and vancomyocin administration, cardiac arrest w/ Vfib (referred to anesthesia record in epic) pt extubated himself next day   Infection of prosthetic right knee joint (HCC)    Liver cirrhosis secondary to NASH (nonalcoholic steatohepatitis) (HCC)    NAFLD-- followed by dr Homero Luster---  liver bx 09-30-2012  mild portal and focal sinusoidal fibrosis   Pre-diabetes    Pyogenic arthritis of right knee joint (HCC) 04/02/2016   Scapholunate advanced collapse of left wrist    deformity   Shock circulatory (HCC)    Staphylococcus aureus bacteremia with sepsis (HCC)    Thrombocytopenia (HCC)    10-09-2017 per pt his platelets have always been low , followed by pcp, never been referred to hematologist    Surgical History: Past Surgical History:  Procedure Laterality Date   APPLICATION OF WOUND VAC Right 02/22/2016   Procedure: APPLICATION OF WOUND VAC;  Surgeon: Christie Cox, MD;  Location: [facility] OR;  Service: Orthopedics;  Laterality: Right;   BIOPSY  02/10/2020   Procedure: BIOPSY;  Surgeon: Ruby Corporal, MD;  [facility];  Service: Endoscopy;;   CARDIAC CATHETERIZATION N/A 02/24/2016   Procedure: Left Heart Cath and Coronary Angiography;  Surgeon: Odie Benne, MD;  Location: [facility] INVASIVE CV LAB;  Service: Cardiovascular;  Laterality: N/A;  No angiographic evidence of CAD,  normal LVSF, ef 50-55%   CARPECTOMY Left 10/17/2017   Procedure: LEFT WRIST PROXIMAL ROW CARPECTOMY WITH POSTEROR IMBROSSEOUS NERVE EXCISION;  Surgeon: Florida Hurter, MD;  Location: [facility];  Service: Orthopedics;  Laterality: Left;  AXILLARY BLOCK   CARPECTOMY WITH RADIAL STYLOIDECTOMY Left 02/11/2019   Procedure: LEFT WRIST RADIAL  STYLOIDECTOMY;  Surgeon: Florida Hurter, MD;  Location: [facility] OR;  Service: Orthopedics;  Laterality: Left;   COLONOSCOPY     COLONOSCOPY N/A 10/22/2023   Procedure: COLONOSCOPY;  Surgeon: Urban Garden, MD;  [facility]: [facility];  Service: Gastroenterology;  Laterality: N/A;  9:45AM;ASA 3   COLONOSCOPY WITH PROPOFOL  N/A 02/10/2020   Procedure: COLONOSCOPY WITH PROPOFOL ;  Surgeon: Ruby Corporal, MD;  [facility];  Service: Endoscopy;  Laterality: N/A;  955   EAR CYST EXCISION Right 09/06/2014   Procedure: OPEN EXCISION BAKER'S CYST RIGHT KNEE;  Surgeon: Christie Cox, MD;  Location: [facility] OR;  Service: Orthopedics;  Laterality: Right;   ESOPHAGEAL BANDING  06/30/2020   Procedure: ESOPHAGEAL BANDING;  Surgeon: Ruby Corporal, MD;  [facility];  Service: Endoscopy;;   ESOPHAGEAL DILATION  1996/ 2000   ESOPHAGOGASTRODUODENOSCOPY N/A 09/17/2012   Procedure: ESOPHAGOGASTRODUODENOSCOPY (EGD);  Surgeon: Ruby Corporal, MD;  [facility];  Service: Endoscopy;  Laterality: N/A;  200   ESOPHAGOGASTRODUODENOSCOPY N/A 12/15/2015   Procedure: ESOPHAGOGASTRODUODENOSCOPY (EGD);  Surgeon: Ruby Corporal, MD;  [facility];  Service: Endoscopy;  Laterality: N/A;  1245   ESOPHAGOGASTRODUODENOSCOPY N/A 12/17/2016   Procedure: ESOPHAGOGASTRODUODENOSCOPY (EGD);  Surgeon: Angelica Kemp  U, MD;  [facility];  Service: Endoscopy;  Laterality: N/A;  210   ESOPHAGOGASTRODUODENOSCOPY N/A 06/30/2020   Procedure: ESOPHAGOGASTRODUODENOSCOPY (EGD);  Surgeon: Ruby Corporal, MD;  [facility];  Service: Endoscopy;  Laterality: N/A;  11:15   ESOPHAGOGASTRODUODENOSCOPY (EGD) WITH PROPOFOL  N/A 02/10/2020   Procedure: ESOPHAGOGASTRODUODENOSCOPY (EGD) WITH PROPOFOL ;  Surgeon: Ruby Corporal, MD;  [facility];  Service: Endoscopy;  Laterality: N/A;   ESOPHAGOGASTRODUODENOSCOPY (EGD) WITH PROPOFOL  N/A 11/04/2020   Procedure:  ESOPHAGOGASTRODUODENOSCOPY (EGD) WITH PROPOFOL ;  Surgeon: Urban Garden, MD;  [facility];  Service: Gastroenterology;  Laterality: N/A;  9:45   I & D KNEE WITH POLY EXCHANGE Right 04/02/2016   Procedure: IRRIGATION AND DEBRIDEMENT RIGHT KNEE WITH POLY EXCHANGE;  Surgeon: Christie Cox, MD;  Location: [facility];  Service: Orthopedics;  Laterality: Right;   INCISION AND DRAINAGE HEMATOMA POST LEFT  TOTAL KNEE  2012   INGUINAL HERNIA REPAIR Bilateral 05/22/2013   Procedure: REPAIR OF RECURRENT INCARCERATED INGUINAL HERNIA WITH MESH RIGHT SIDE,  REPAIR OF RECURRENT INGUINAL HERNIA WITH MESH LEFT SIDE;  Surgeon: Levert Ready, MD;  Location: [facility];  Service: General;  Laterality: Bilateral;   INGUINAL HERNIA REPAIR Bilateral 11/1997   INSERTION OF MESH Bilateral 05/22/2013   Procedure: INSERTION OF MESH;  Surgeon: Levert Ready, MD;  Location: [facility];  Service: General;  Laterality: Bilateral;   IRRIGATION AND DEBRIDEMENT KNEE Right 02/22/2016   Procedure: IRRIGATION AND DEBRIDEMENT KNEE;  Surgeon: Christie Cox, MD;  Location: [facility];  Service: Orthopedics;  Laterality: Right;   Irrigation and Debridement right knee  03/23/2009   Dr. Annabell Key, [facility]   KNEE ARTHROSCOPY W/ SYNOVECTOMY Right 01/30/2010   AND DEBRIDEMENT OF HETEROTOPIC   LIVER BIOPSY  09/30/2012   REVISION TOTAL KNEE ARTHROPLASTY Left fall 2012   TONSILLECTOMY  child   TOTAL KNEE ARTHROPLASTY Bilateral right 09/ 2010/  left 09-18-2010   TRANSTHORACIC ECHOCARDIOGRAM  02/22/2016   ef 30-35% (per cardiac cath 02-24-2016 normal), severe hypokinesis of the mid-apicalanteroseptal and apical myocardium,  grade 1 diastolic dysfunction/ trivial PR and TR   TRIGGER FINGER RELEASE Left 02/11/2019   Procedure: LEFT WRIST STENOSING TENOSYNOVITIS RELEASE;  Surgeon: Florida Hurter, MD;  Location: [facility] OR;  Service: Orthopedics;  Laterality: Left;  MAC WITH AXILLARY BLOCK   WRIST ARTHROPLASTY Left 10/17/2017   Procedure: CAPITATE  RESURFACING ARTHROPLASTY;  Surgeon: Florida Hurter, MD;  Location: [facility];  Service: Orthopedics;  Laterality: Left;    Social History: Social History   Socioeconomic History   Marital status: Legally Separated    Spouse name: Not on file   Number of children: 2   Years of education: Not on file   Highest education level: 12th grade  Occupational History    Employer: [employer]    Comment: part time  Tobacco Use   Smoking status: Former    Current packs/day: 0.00    Average packs/day: 0.3 packs/day for 3.0 years (0.8 ttl pk-yrs)    Types: Cigarettes    Start date: 10/10/1971    Quit date: 10/10/1974    Years since quitting: 49.2    Passive exposure: Past   Smokeless tobacco: Never  Vaping Use   Vaping status: Never Used  Substance and Sexual Activity   Alcohol use: Not Currently   Drug use: No   Sexual activity: Yes  Other Topics Concern   Not on file  Social History Narrative   ** Merged  History Encounter **       Pt lives alone.   Social Drivers of Corporate investment banker Strain: Low Risk  (11/16/2019)   Overall Financial Resource Strain (CARDIA)    Difficulty of Paying Living Expenses: Not hard at all  Food Insecurity: No Food Insecurity (12/12/2023)   Hunger Vital Sign    Worried About Running Out of Food in the Last Year: Never true    Ran Out of Food in the Last Year: Never true  Transportation Needs: No Transportation Needs (02/28/2021)   PRAPARE - Administrator, Civil Service (Medical): No    Lack of Transportation (Non-Medical): No  Physical Activity: Inactive (11/16/2019)   Exercise Vital Sign    Days of Exercise per Week: 0 days    Minutes of Exercise per Session: 0 min  Stress: No Stress Concern Present (11/16/2019)   [employer] of Occupational Health - Occupational Stress Questionnaire    Feeling of Stress : Not at all  Social Connections: Moderately Isolated (11/16/2019)   Social Connection and Isolation  Panel    Frequency of Communication with Friends and Family: More than three times a week    Frequency of Social Gatherings with Friends and Family: More than three times a week    Attends Religious Services: More than 4 times per year    Active Member of [employer] or Organizations: No    Attends Banker Meetings: Never    Marital Status: Separated  Intimate Partner Violence: Not At Risk (12/12/2023)   Humiliation, Afraid, Rape, and Kick questionnaire    Fear of Current or Ex-Partner: No    Emotionally Abused: No    Physically Abused: No    Sexually Abused: No    Family History: Family History  Problem Relation Age of Onset   Arthritis Father        died age 34   GI Bleed Father        upper GI Bleed, non-ETOH cirrhosis   Arthritis Brother    Diabetes Brother        type 2   Diabetes Paternal Uncle        type 2   Heart attack Maternal Grandmother    Heart attack Maternal Grandfather    Colon cancer Neg Hx    Colon polyps Neg Hx     Current Medications:  Current Outpatient Medications:    triamcinolone  cream (KENALOG ) 0.5 %, Apply topically., Disp: , Rfl:    EPINEPHrine  (PRIMATENE  MIST) 0.125 MG/ACT AERO, Inhale 1 puff into the lungs daily as needed (asthma)., Disp: , Rfl:    ferrous sulfate  325 (65 FE) MG tablet, Take 1 tablet (325 mg total) by mouth daily with breakfast., Disp: 60 tablet, Rfl: 1   furosemide  (LASIX ) 40 MG tablet, Take 2 tablets by mouth once daily, Disp: 180 tablet, Rfl: 0   glipiZIDE  (GLUCOTROL  XL) 10 MG 24 hr tablet, Take 10 mg by mouth daily with breakfast., Disp: , Rfl:    sildenafil (VIAGRA) 100 MG tablet, Take 100 mg by mouth daily as needed for erectile dysfunction., Disp: , Rfl:    spironolactone  (ALDACTONE ) 100 MG tablet, Take 2 tablets by mouth once daily, Disp: 180 tablet, Rfl: 0   Allergies: Allergies  Allergen Reactions   Asa [Aspirin] Shortness Of Breath    asthma symptoms   Penicillins Shortness Of Breath    Has patient had  a PCN reaction causing immediate rash, facial/tongue/throat swelling, SOB or lightheadedness with hypotension:  Yes Has patient had a PCN reaction causing severe rash involving mucus membranes or skin necrosis: No Has patient had a PCN reaction that required hospitalization No Has patient had a PCN reaction occurring within the last 10 years: No If all of the above answers are NO, then may proceed with Cephalosporin use. asthma symptoms Patient has tolerated cefazolin , ceftriaxo   Vancomycin  Anaphylaxis    Immediately following being turned into prone position and Vancomycin  administration in the OR patient cardiac arrest w/  Vfib.    REVIEW OF SYSTEMS:   Review of Systems  Constitutional:  Negative for chills, fatigue and fever.  HENT:   Negative for lump/mass, mouth sores, nosebleeds, sore throat and trouble swallowing.   Eyes:  Negative for eye problems.  Respiratory:  Negative for cough and shortness of breath.   Cardiovascular:  Negative for chest pain, leg swelling and palpitations.  Gastrointestinal:  Negative for abdominal pain, constipation, diarrhea, nausea and vomiting.  Genitourinary:  Negative for bladder incontinence, difficulty urinating, dysuria, frequency, hematuria and nocturia.   Musculoskeletal:  Negative for arthralgias, back pain, flank pain, myalgias and neck pain.  Skin:  Negative for itching and rash.  Neurological:  Negative for dizziness, headaches and numbness.  Hematological:  Does not bruise/bleed easily.  Psychiatric/Behavioral:  Negative for depression, sleep disturbance and suicidal ideas. The patient is not nervous/anxious.   All other systems reviewed and are negative.    VITALS:   Blood pressure 113/64, pulse 74, temperature 98.2 F (36.8 C), temperature source Tympanic, resp. rate 18, height 5' 10.47 (1.79 m), weight 198 lb 13.7 oz (90.2 kg), SpO2 98%.  Wt Readings from Last 3 Encounters:  12/12/23 198 lb 13.7 oz (90.2 kg)  12/09/23 197 lb  3.2 oz (89.4 kg)  11/26/23 197 lb (89.4 kg)    Body mass index is 28.15 kg/m.   PHYSICAL EXAM:   Physical Exam Vitals and nursing note reviewed. Exam conducted with a chaperone present.  Constitutional:      Appearance: Normal appearance.   Cardiovascular:     Rate and Rhythm: Normal rate and regular rhythm.     Pulses: Normal pulses.     Heart sounds: Normal heart sounds.  Pulmonary:     Effort: Pulmonary effort is normal.     Breath sounds: Normal breath sounds.  Abdominal:     Palpations: Abdomen is soft. There is splenomegaly (palpable 4 fingerbreadths below the left costal margin). There is no hepatomegaly or mass.     Tenderness: There is no abdominal tenderness.   Musculoskeletal:     Right lower leg: No edema.     Left lower leg: No edema.  Lymphadenopathy:     Cervical: No cervical adenopathy.     Right cervical: No superficial, deep or posterior cervical adenopathy.    Left cervical: No superficial, deep or posterior cervical adenopathy.     Upper Body:     Right upper body: No supraclavicular or axillary adenopathy.     Left upper body: No supraclavicular or axillary adenopathy.   Neurological:     General: No focal deficit present.     Mental Status: He is alert and oriented to person, place, and time.   Psychiatric:        Mood and Affect: Mood normal.        Behavior: Behavior normal.     LABS:   CBC    Component Value Date/Time   WBC 3.4 (L) 11/03/2023 0651   RBC 3.99 (L) 12/12/2023  0926   RBC 3.32 (L) 11/03/2023 0651   HGB 9.6 (L) 11/03/2023 0651   HGB 14.7 04/27/2019 1147   HCT 29.9 (L) 11/03/2023 0651   HCT 42.8 04/27/2019 1147   PLT 81 (L) 11/03/2023 0651   PLT 78 (LL) 04/27/2019 1147   MCV 90.1 11/03/2023 0651   MCV 89 04/27/2019 1147   MCH 28.9 11/03/2023 0651   MCHC 32.1 11/03/2023 0651   RDW 21.5 (H) 11/03/2023 0651   RDW 14.2 04/27/2019 1147   LYMPHSABS 0.1 (L) 11/03/2023 0651   MONOABS 0.2 11/03/2023 0651   EOSABS 0.1  11/03/2023 0651   BASOSABS 0.0 11/03/2023 0651    CMP    Component Value Date/Time   NA 133 (L) 11/03/2023 0651   NA 141 04/27/2019 1147   K 3.6 11/03/2023 0651   CL 102 11/03/2023 0651   CO2 22 11/03/2023 0651   GLUCOSE 232 (H) 11/03/2023 0651   BUN 13 11/03/2023 0651   BUN 10 04/27/2019 1147   CREATININE 0.95 11/03/2023 0651   CREATININE 1.04 09/23/2023 1553   CALCIUM 8.5 (L) 11/03/2023 0651   PROT 6.6 11/03/2023 0651   PROT 6.5 04/27/2019 1147   ALBUMIN 3.9 11/03/2023 0651   ALBUMIN 4.3 04/27/2019 1147   AST 39 11/03/2023 0651   ALT 25 11/03/2023 0651   ALKPHOS 113 11/03/2023 0651   BILITOT 4.7 (H) 11/03/2023 0651   BILITOT 1.6 (H) 04/27/2019 1147   GFRNONAA >60 11/03/2023 0651   GFRAA >60 02/08/2020 1525    No results found for: CEA1, CEA / No results found for: CEA1, CEA Lab Results  Component Value Date   PSA1 3.6 04/27/2019   No results found for: XLK440 No results found for: NUU725  Lab Results  Component Value Date   TOTALPROTELP 6.0 12/31/2018   ALBUMINELP 3.6 12/31/2018   A1GS 0.2 12/31/2018   A2GS 0.4 12/31/2018   BETS 0.7 12/31/2018   GAMS 1.1 12/31/2018   MSPIKE Not Observed 12/31/2018   SPEI Comment 12/31/2018   Lab Results  Component Value Date   TIBC 360 12/09/2023   TIBC 456 (H) 09/25/2023   FERRITIN 56 12/09/2023   FERRITIN 15 (L) 09/25/2023   FERRITIN 205 09/12/2012   IRONPCTSAT 14 (L) 12/09/2023   IRONPCTSAT 4 (L) 09/25/2023   Lab Results  Component Value Date   LDH 197 (H) 12/12/2023   LDH 140 07/30/2019   LDH 208 (H) 12/31/2018     STUDIES:   No results found.

## 2023-12-12 NOTE — Patient Instructions (Signed)
 You were seen and examined today by Dr. Cheree Cords. Dr. Cheree Cords is a hematologist, meaning that he specializes in blood abnormalities. Dr. Cheree Cords discussed your past medical history, family history of cancers/blood conditions and the events that led to you being here today.  You were referred to Dr. Cheree Cords due to low blood counts.  Dr. Katragadda has recommended additional labs today for further evaluation.  Follow-up as scheduled.

## 2023-12-13 LAB — PROTEIN ELECTROPHORESIS, SERUM
A/G Ratio: 1.3 (ref 0.7–1.7)
Albumin ELP: 3.5 g/dL (ref 2.9–4.4)
Alpha-1-Globulin: 0.2 g/dL (ref 0.0–0.4)
Alpha-2-Globulin: 0.4 g/dL (ref 0.4–1.0)
Beta Globulin: 0.9 g/dL (ref 0.7–1.3)
Gamma Globulin: 1.1 g/dL (ref 0.4–1.8)
Globulin, Total: 2.7 g/dL (ref 2.2–3.9)
Total Protein ELP: 6.2 g/dL (ref 6.0–8.5)

## 2023-12-13 LAB — COPPER, SERUM: Copper: 83 ug/dL (ref 69–132)

## 2023-12-13 LAB — KAPPA/LAMBDA LIGHT CHAINS
Kappa free light chain: 24.9 mg/L — ABNORMAL HIGH (ref 3.3–19.4)
Kappa, lambda light chain ratio: 0.73 (ref 0.26–1.65)
Lambda free light chains: 34.3 mg/L — ABNORMAL HIGH (ref 5.7–26.3)

## 2023-12-13 LAB — HAPTOGLOBIN: Haptoglobin: 23 mg/dL — ABNORMAL LOW (ref 32–363)

## 2023-12-15 LAB — IMMUNOFIXATION ELECTROPHORESIS
IgA: 293 mg/dL (ref 61–437)
IgG (Immunoglobin G), Serum: 1219 mg/dL (ref 603–1613)
IgM (Immunoglobulin M), Srm: 64 mg/dL (ref 20–172)
Total Protein ELP: 6.2 g/dL (ref 6.0–8.5)

## 2023-12-16 ENCOUNTER — Ambulatory Visit (HOSPITAL_COMMUNITY)
Admission: RE | Admit: 2023-12-16 | Discharge: 2023-12-16 | Disposition: A | Discharge status: Home / Self Care | Source: Ambulatory Visit | Attending: Gastroenterology | Admitting: Gastroenterology

## 2023-12-16 DIAGNOSIS — K769 Liver disease, unspecified: Secondary | ICD-10-CM | POA: Insufficient documentation

## 2023-12-16 DIAGNOSIS — N281 Cyst of kidney, acquired: Secondary | ICD-10-CM | POA: Diagnosis not present

## 2023-12-16 DIAGNOSIS — K7460 Unspecified cirrhosis of liver: Secondary | ICD-10-CM | POA: Diagnosis not present

## 2023-12-16 DIAGNOSIS — K7581 Nonalcoholic steatohepatitis (NASH): Secondary | ICD-10-CM | POA: Diagnosis not present

## 2023-12-16 DIAGNOSIS — R161 Splenomegaly, not elsewhere classified: Secondary | ICD-10-CM | POA: Diagnosis not present

## 2023-12-16 MED ORDER — GADOXETATE DISODIUM 0.25 MMOL/ML IV SOLN
9.0000 mL | Freq: Once | INTRAVENOUS | Status: AC | PRN
  Administered 2023-12-16: 9 mL via INTRAVENOUS

## 2023-12-17 ENCOUNTER — Inpatient Hospital Stay

## 2023-12-17 VITALS — BP 128/68 | HR 61 | Temp 98.3°F | Resp 18

## 2023-12-17 DIAGNOSIS — D509 Iron deficiency anemia, unspecified: Secondary | ICD-10-CM

## 2023-12-17 LAB — METHYLMALONIC ACID, SERUM: Methylmalonic Acid, Quantitative: 227 nmol/L (ref 0–378)

## 2023-12-17 MED ORDER — ACETAMINOPHEN 325 MG PO TABS
650.0000 mg | ORAL_TABLET | Freq: Once | ORAL | Status: AC
  Administered 2023-12-17: 650 mg via ORAL
  Filled 2023-12-17: qty 2

## 2023-12-17 MED ORDER — SODIUM CHLORIDE 0.9 % IV SOLN: INTRAVENOUS | Status: DC

## 2023-12-17 MED ORDER — CETIRIZINE HCL 10 MG PO TABS
10.0000 mg | ORAL_TABLET | Freq: Once | ORAL | Status: AC
  Administered 2023-12-17: 10 mg via ORAL
  Filled 2023-12-17: qty 1

## 2023-12-17 MED ORDER — SODIUM CHLORIDE 0.9 % IV SOLN
1000.0000 mg | Freq: Once | INTRAVENOUS | Status: AC
  Administered 2023-12-17: 1000 mg via INTRAVENOUS
  Filled 2023-12-17: qty 1000

## 2023-12-17 NOTE — Progress Notes (Signed)
Patient presents today for Monoferric infusion per providers order.  Vital signs WNL.  Patient has no new complaints at this time.    Peripheral IV started and blood return noted pre and post infusion.  Stable during infusion without adverse affects.  Vital signs stable.  No complaints at this time.  Discharge from clinic ambulatory in stable condition.  Alert and oriented X 3.  Follow up with [facility] as scheduled.

## 2023-12-17 NOTE — Patient Instructions (Signed)
 [facility] [place] - A DEPT OF [facility]  Discharge Instructions: Thank you for choosing [facility] to provide your oncology and hematology care.  If you have a lab appointment with the Cancer Center - please note that after April 8th, 2024, all labs will be drawn in the cancer center.  You do not have to check in or register with the main entrance as you have in the past but will complete your check-in in the cancer center.  Wear comfortable clothing and clothing appropriate for easy access to any Portacath or PICC line.   We strive to give you quality time with your provider. You may need to reschedule your appointment if you arrive late (15 or more minutes).  Arriving late affects you and other patients whose appointments are after yours.  Also, if you miss three or more appointments without notifying the office, you may be dismissed from the clinic at the provider's discretion.      For prescription refill requests, have your pharmacy contact our office and allow 72 hours for refills to be completed.    Today you received the following chemotherapy and/or immunotherapy agents Monoferric      To help prevent nausea and vomiting after your treatment, we encourage you to take your nausea medication as directed.  BELOW ARE SYMPTOMS THAT SHOULD BE REPORTED IMMEDIATELY: *FEVER GREATER THAN 100.4 F (38 C) OR HIGHER *CHILLS OR SWEATING *NAUSEA AND VOMITING THAT IS NOT CONTROLLED WITH YOUR NAUSEA MEDICATION *UNUSUAL SHORTNESS OF BREATH *UNUSUAL BRUISING OR BLEEDING *URINARY PROBLEMS (pain or burning when urinating, or frequent urination) *BOWEL PROBLEMS (unusual diarrhea, constipation, pain near the anus) TENDERNESS IN MOUTH AND THROAT WITH OR WITHOUT PRESENCE OF ULCERS (sore throat, sores in mouth, or a toothache) UNUSUAL RASH, SWELLING OR PAIN  UNUSUAL VAGINAL DISCHARGE OR ITCHING   Items with * indicate a potential emergency and should be followed  up as soon as possible or go to the Emergency Department if any problems should occur.  Please show the CHEMOTHERAPY ALERT CARD or IMMUNOTHERAPY ALERT CARD at check-in to the Emergency Department and triage nurse.  Should you have questions after your visit or need to cancel or reschedule your appointment, please contact [facility] [place] - A DEPT OF Eligha Brideg[facility] [phone]  and follow the prompts.  Office hours are 8:00 a.m. to 4:30 p.m. Monday - Friday. Please note that voicemails left after 4:00 p.m. may not be returned until the following business day.  We are closed weekends and major holidays. You have access to a nurse at all times for urgent questions. Please call the main number to the clinic [phone] and follow the prompts.  For any non-urgent questions, you may also contact your provider using MyChart. We now offer e-Visits for anyone 54 and older to request care online for non-urgent symptoms. For details visit [email or web address].   Also download the MyChart app! Go to the app store, search "MyChart", open the app, select [place], and log in with your MyChart username and password.

## 2024-01-02 ENCOUNTER — Ambulatory Visit (INDEPENDENT_AMBULATORY_CARE_PROVIDER_SITE_OTHER): Admitting: Gastroenterology

## 2024-01-14 ENCOUNTER — Other Ambulatory Visit (INDEPENDENT_AMBULATORY_CARE_PROVIDER_SITE_OTHER): Payer: Self-pay | Admitting: Gastroenterology

## 2024-01-14 LAB — MISC LABCORP TEST (SEND OUT): Labcorp test code: 452312

## 2024-01-23 ENCOUNTER — Inpatient Hospital Stay: Attending: Hematology

## 2024-01-23 ENCOUNTER — Inpatient Hospital Stay: Admitting: Hematology

## 2024-01-23 VITALS — BP 137/66 | HR 67 | Temp 98.5°F | Resp 18 | Wt 200.6 lb

## 2024-01-23 DIAGNOSIS — Z87891 Personal history of nicotine dependence: Secondary | ICD-10-CM | POA: Insufficient documentation

## 2024-01-23 DIAGNOSIS — D696 Thrombocytopenia, unspecified: Secondary | ICD-10-CM | POA: Diagnosis not present

## 2024-01-23 DIAGNOSIS — K7581 Nonalcoholic steatohepatitis (NASH): Secondary | ICD-10-CM | POA: Diagnosis not present

## 2024-01-23 DIAGNOSIS — R162 Hepatomegaly with splenomegaly, not elsewhere classified: Secondary | ICD-10-CM | POA: Insufficient documentation

## 2024-01-23 DIAGNOSIS — K7460 Unspecified cirrhosis of liver: Secondary | ICD-10-CM | POA: Diagnosis not present

## 2024-01-23 DIAGNOSIS — Z86718 Personal history of other venous thrombosis and embolism: Secondary | ICD-10-CM | POA: Diagnosis not present

## 2024-01-23 DIAGNOSIS — D61818 Other pancytopenia: Secondary | ICD-10-CM

## 2024-01-23 DIAGNOSIS — D509 Iron deficiency anemia, unspecified: Secondary | ICD-10-CM

## 2024-01-23 LAB — CBC WITH DIFFERENTIAL/PLATELET
Abs Immature Granulocytes: 0 K/uL (ref 0.00–0.07)
Basophils Absolute: 0 K/uL (ref 0.0–0.1)
Basophils Relative: 0 %
Eosinophils Absolute: 0.1 K/uL (ref 0.0–0.5)
Eosinophils Relative: 3 %
HCT: 38 % — ABNORMAL LOW (ref 39.0–52.0)
Hemoglobin: 13.2 g/dL (ref 13.0–17.0)
Immature Granulocytes: 0 %
Lymphocytes Relative: 9 %
Lymphs Abs: 0.2 K/uL — ABNORMAL LOW (ref 0.7–4.0)
MCH: 32 pg (ref 26.0–34.0)
MCHC: 34.7 g/dL (ref 30.0–36.0)
MCV: 92 fL (ref 80.0–100.0)
Monocytes Absolute: 0.4 K/uL (ref 0.1–1.0)
Monocytes Relative: 13 %
Neutro Abs: 2 K/uL (ref 1.7–7.7)
Neutrophils Relative %: 75 %
Platelets: 65 K/uL — ABNORMAL LOW (ref 150–400)
RBC: 4.13 MIL/uL — ABNORMAL LOW (ref 4.22–5.81)
RDW: 13.3 % (ref 11.5–15.5)
WBC: 2.7 K/uL — ABNORMAL LOW (ref 4.0–10.5)
nRBC: 0 % (ref 0.0–0.2)

## 2024-01-23 LAB — IRON AND TIBC
Iron: 52 ug/dL (ref 45–182)
Saturation Ratios: 21 % (ref 17.9–39.5)
TIBC: 245 ug/dL — ABNORMAL LOW (ref 250–450)
UIBC: 193 ug/dL

## 2024-01-23 LAB — FERRITIN: Ferritin: 105 ng/mL (ref 24–336)

## 2024-01-23 NOTE — Patient Instructions (Addendum)
 [facility] at [facility] Discharge Instructions   You were seen and examined today by Dr. Ellin Saba.  He reviewed the results of your lab work which are normal/stable.   We will see you back in 4 months. We will repeat lab work prior to this visit.   Return as scheduled.    Thank you for choosing [facility] at [facility] to provide your oncology and hematology care.  To afford each patient quality time with our provider, please arrive at least 15 minutes before your scheduled appointment time.   If you have a lab appointment with the Cancer Center please come in thru the Main Entrance and check in at the main information desk.  You need to re-schedule your appointment should you arrive 10 or more minutes late.  We strive to give you quality time with our providers, and arriving late affects you and other patients whose appointments are after yours.  Also, if you no show three or more times for appointments you may be dismissed from the clinic at the providers discretion.     Again, thank you for choosing [facility].  Our hope is that these requests will decrease the amount of time that you wait before being seen by our physicians.       _____________________________________________________________  Should you have questions after your visit to [facility], please contact our office at [phone] and follow the prompts.  Our office hours are 8:00 a.m. and 4:30 p.m. Monday - Friday.  Please note that voicemails left after 4:00 p.m. may not be returned until the following business day.  We are closed weekends and major holidays.  You do have access to a nurse 24-7, just call the main number to the clinic [phone] and do not press any options, hold on the line and a nurse will answer the phone.    For prescription refill requests, have your pharmacy contact our office and allow 72 hours.    Due to Covid, you will  need to wear a mask upon entering the hospital. If you do not have a mask, a mask will be given to you at the Main Entrance upon arrival. For doctor visits, patients may have 1 support person age 73 or older with them. For treatment visits, patients can not have anyone with them due to social distancing guidelines and our immunocompromised population.

## 2024-01-23 NOTE — Progress Notes (Signed)
 [facility] [address], [place] [address]   Clinic Day:  01/23/2024  Referring physician: Shona Norleen PEDLAR, MD  Patient Care Team: Shona Norleen PEDLAR, MD as PCP - General (Internal Medicine) Sissy Cough, MD as Consulting Physician (Orthopedic Surgery) Cherilyn Debby CROME, MD as Consulting Physician (Internal Medicine) Golda Claudis PENNER, MD (Inactive) as Consulting Physician (Gastroenterology)   ASSESSMENT & PLAN:   Assessment: 1.  Pancytopenia: - History of leukopenia and thrombocytopenia over the last decade. - History of Nash cirrhosis and splenomegaly. - Worsening anemia in the last 1 year with hemoglobin between 8-9. - Started on iron tablet daily from 1 April. - Latest ferritin was 56 with saturation 14 on 12/09/2023. - CTAP on 11/03/2023: Splenomegaly measuring 18.7 cm.  New subtle subcapsular hypodensity in the posterior left liver measures 16 mm.  Cirrhotic liver morphology. - Denies BRBPR/melena.   2.  Provoked DVT: - He had a deep vein thrombosis of the lower extremity after knee surgery 12 years ago.  He was on limited duration anticoagulation.  No recurrence noted.  3. Social/Family History: -No tobacco use. No ETOH. No chemical exposure.  -No family history of leukemia. Sister had cancer, some type of GI.   Plan:  1.  Pancytopenia: - Most likely etiology of leukopenia and thrombocytopenia splenomegaly. - Monoferric  1 g on 12/17/2023. - Ferritin improved to 105.  Saturation is 21%.  Hemoglobin improved to 13.2 from 12.2. - Workup on 12/12/2023: B12 and folic acid  was normal.  Multiple myeloma workup is negative.  Direct Coombs test was positive for complement and IgG.  LDH is only minimally elevated at 197 and reticulocyte count is 3%.  Not strongly indicative of hemolytic anemia. - Recommend follow-up in 4 months with repeat CBC, ferritin and iron panel.  Will also repeat workup for hemolysis.  2.  Hypodensity in the left liver: - Last AFP in  March was normal. - MRI liver on 12/16/2023: No findings suspicious for HCC.  Perigastric varices present.   Orders Placed This Encounter  Procedures   CBC with Differential    Standing Status:   Future    Expected Date:   05/18/2024    Expiration Date:   08/16/2024   Reticulocytes    Standing Status:   Future    Expected Date:   05/18/2024    Expiration Date:   08/16/2024   Lactate dehydrogenase    Standing Status:   Future    Expected Date:   05/18/2024    Expiration Date:   08/16/2024   Haptoglobin    Standing Status:   Future    Expected Date:   05/18/2024    Expiration Date:   08/16/2024   Iron and TIBC ([facility] ONLY)    Standing Status:   Future    Expected Date:   05/18/2024    Expiration Date:   08/16/2024   Ferritin    Standing Status:   Future    Expected Date:   05/18/2024    Expiration Date:   08/16/2024   Direct antiglobulin test    Standing Status:   Future    Expected Date:   05/18/2024    Expiration Date:   08/16/2024      Stephen Burch,acting as a scribe for Alean Stands, MD.,have documented all relevant documentation on the behalf of Alean Stands, MD,as directed by  Alean Stands, MD while in the presence of Alean Stands, MD.  I, Alean Stands MD, have reviewed the  above documentation for accuracy and completeness, and I agree with the above.    Alean Stands, MD   7/24/20251:03 PM  CHIEF COMPLAINT/PURPOSE OF CONSULT:   Diagnosis: Pancytopenia and liver hypodensity  Current Therapy: Monoferric   HISTORY OF PRESENT ILLNESS:   Stephen Burch is a 68 y.o. male presenting to clinic today for evaluation of pancytopenia at the request of Carlan, Mitzie CROME, NP.  Patient has a medical history of NASH liver cirrhosis, gastric varices, esophageal varices, rectal varices, Barrett's esophagus, DM type II, and hyperthyroidism.   Stephen Burch was seen by his gastroenterologist on 12/09/23 after an ED visit on  11/03/23 for epigastric pain with CT finding a liver mass. At this visit, he reported a history of pancytopenia spanning multiple years and has seen me for this but did not follow-up. He was recommended to continue oral iron supplements at GI visit, which he began in April 2025. His last visit with me was on 12/31/2018 and lab work was done after this visit for workup of etiology of pancytopenia was done, including B12, folic acid , methylmalonic acid, copper  levels, LDH, SPEP, and flow cytometry of blood. At that time, LDH was elevated at 208 and other blood work was normal.   His most recent CBC diff from 11/03/23 showed low WBC at 3.4, low RBC at 3.32, low HGB at 9.6, low HCT at 29.9, elevated RDW at 21.5, low platelets at 81, and low absolute lymphocytes at 0.1.   Iron and ferritin panel from 12/09/23 showed normal ferritin at 56, normal iron at 51, and low iron saturation at 14.   CT AP on 11/03/23 showed: Cirrhotic liver morphology with sequela of portal hypertension including splenomegaly, esophageal varices, gastric varices, and small volume ascites. Portal vein is enlarged but patent. New subtle subcapsular hypodensity in the posterior left liver measures 16 mm. This is nonspecific and may be related to the underlying cirrhosis. Cholelithiasis with distended gallbladder and ill-defined gallbladder wall. Multiple calcified gallstones are seen in the neck of the gallbladder and potentially in the cystic duct. Acute cholecystitis a concern. Mural edema and thickening in the cecum and ascending colon. Imaging features are nonspecific and may be related to hepatic insufficiency. Colitis could have this appearance. Duodenal wall is ill-defined and appears thickened with periduodenal edema/inflammation. Edema/inflammation in the extraperitoneal soft tissues of the upper abdomen around the celiac axis and SMA and involving the central small bowel mesentery. Pancreas does not appear to be the epicenter of these changes  and duodenitis would be a consideration. Sequelae of portal venous hypertension is also a possibility. Left varicocele. Aortic Atherosclerosis.   Gadge also had US  of RUQ abdomen done on 11/03/23 which found: Suboptimal right upper quadrant visualization by ultrasound. Cholelithiasis better demonstrated by CT. Generalized gallbladder wall thickening up to 4 mm, but absent sonographic Beverley sign suggests this may be due to liver disease rather than to acute cholecystitis. Nodular, Cirrhotic liver. No evidence of bile duct obstruction.  He has an MRI of the liver scheduled next week.   His last colonoscopy was on 10/22/2023 that found 9 non-bleeding angiodysplastic lesion (treated with APC), two 1-2 mm polyps in the cecum, four 3-6 mm polyps in the transverse colon and cecum, two 3-4 mm polyps in the sigmoid colon and descending colon, and rectal varices.   Today, he states that he is doing well overall. His appetite level is at 100%. His energy level is at 25%.  Julez denies any infections in the last 6 months, BRBPR, melena, epistaxis,  easily bruising/bleeding, or other bleeding issues. He notes a rash on the back 1 month ago and saw dermatology.   He denies any constipation due to oral iron supplements.   Stephen Burch reports a history of splenomegaly with a spleen size of 19 cm in the past.   INTERVAL HISTORY:   Stephen Burch is a 68 y.o. male presenting to the clinic today for follow-up of Pancytopenia and liver hypodensity. He was last seen by me on 12/12/2023.  Since his last visit, he underwent MRI liver on 12/16/2023 that found: Cirrhosis. No findings suspicious for HCC, noting mild motion degradation. Splenomegaly and perigastric varices. Small right pleural effusion.  Today, he states that he is doing well overall. His appetite level is at 100%. His energy level is at 40-50%. Stephen Burch denies any improvement in energy after his iron infusion on 12/17/2023. He denies any blood per rectum,  hematuria, or melena. He is not taking any new medications and does not have any surgeries planned.   Stephen Burch reports slow coagulation when bleeding.  PAST MEDICAL HISTORY:   Past Medical History: Past Medical History:  Diagnosis Date   Acute systolic (congestive) heart failure (HCC) 04/30/2018   Arthritis    knees, wrists   Barrett's esophagus    Cardiac arrest (HCC) 02/22/2016   Complication of anesthesia    02-22-2016 intraop cardiac arrest immediately after vancomyocin administration , pt cardiac arrest w/ Vfib,  please refer to anesthesia record in epic   Dysrhythmia    Afib 12/2018 - converted to NSR   Esophageal varices determined by endoscopy (HCC) 11/2016   grade 1   GERD (gastroesophageal reflux disease)    Hiatal hernia    History of adenomatous polyp of colon    History of asthma    as child   History of DVT (deep vein thrombosis)    right lower extremitty post-op right total knee surgery 09/ 2010,  treated w/ coumadin   History of esophageal dilatation 07/1998   for schatzski ring   History of kidney stones    History of staph infection 04/2016   MSSA infection of right total knee w/ sepsis   Hx of cardiac arrest    02-22-2016  intraoperative, immediately following moving pt into prone position and vancomyocin administration, cardiac arrest w/ Vfib (referred to anesthesia record in epic) pt extubated himself next day   Infection of prosthetic right knee joint (HCC)    Liver cirrhosis secondary to NASH (nonalcoholic steatohepatitis) (HCC)    NAFLD-- followed by dr golda---  liver bx 09-30-2012  mild portal and focal sinusoidal fibrosis   Pre-diabetes    Pyogenic arthritis of right knee joint (HCC) 04/02/2016   Scapholunate advanced collapse of left wrist    deformity   Shock circulatory (HCC)    Staphylococcus aureus bacteremia with sepsis (HCC)    Thrombocytopenia (HCC)    10-09-2017 per pt his platelets have always been low , followed by pcp, never been  referred to hematologist    Surgical History: Past Surgical History:  Procedure Laterality Date   APPLICATION OF WOUND VAC Right 02/22/2016   Procedure: APPLICATION OF WOUND VAC;  Surgeon: Marcey Raman, MD;  Location: [facility] OR;  Service: Orthopedics;  Laterality: Right;   BIOPSY  02/10/2020   Procedure: BIOPSY;  Surgeon: golda Claudis PENNER, MD;  [facility];  Service: Endoscopy;;   CARDIAC CATHETERIZATION N/A 02/24/2016   Procedure: Left Heart Cath and Coronary Angiography;  Surgeon: Lonni JONETTA Cash, MD;  Location:  [facility];  Service: Cardiovascular;  Laterality: N/A;  No angiographic evidence of CAD,  normal LVSF, ef 50-55%   CARPECTOMY Left 10/17/2017   Procedure: LEFT WRIST PROXIMAL ROW CARPECTOMY WITH POSTEROR IMBROSSEOUS NERVE EXCISION;  Surgeon: Sissy Cough, MD;  Location: [facility];  Service: Orthopedics;  Laterality: Left;  AXILLARY BLOCK   CARPECTOMY WITH RADIAL STYLOIDECTOMY Left 02/11/2019   Procedure: LEFT WRIST RADIAL STYLOIDECTOMY;  Surgeon: Sissy Cough, MD;  [facility] OR;  Service: Orthopedics;  Laterality: Left;   COLONOSCOPY     COLONOSCOPY N/A 10/22/2023   Procedure: COLONOSCOPY;  Surgeon: Eartha Angelia Sieving, MD;  [facility];  Service: Gastroenterology;  Laterality: N/A;  9:45AM;ASA 3   COLONOSCOPY WITH PROPOFOL  N/A 02/10/2020   Procedure: COLONOSCOPY WITH PROPOFOL ;  Surgeon: Golda Claudis PENNER, MD;  [facility];  Service: Endoscopy;  Laterality: N/A;  955   EAR CYST EXCISION Right 09/06/2014   Procedure: OPEN EXCISION BAKER'S CYST RIGHT KNEE;  Surgeon: Marcey Raman, MD;  Location: [facility];  Service: Orthopedics;  Laterality: Right;   ESOPHAGEAL BANDING  06/30/2020   Procedure: ESOPHAGEAL BANDING;  Surgeon: Golda Claudis PENNER, MD;  [facility];  Service: Endoscopy;;   ESOPHAGEAL DILATION  1996/ 2000   ESOPHAGOGASTRODUODENOSCOPY N/A 09/17/2012   Procedure: ESOPHAGOGASTRODUODENOSCOPY (EGD);   Surgeon: Claudis PENNER Golda, MD;  [facility];  Service: Endoscopy;  Laterality: N/A;  200   ESOPHAGOGASTRODUODENOSCOPY N/A 12/15/2015   Procedure: ESOPHAGOGASTRODUODENOSCOPY (EGD);  Surgeon: Claudis PENNER Golda, MD;  [facility];  Service: Endoscopy;  Laterality: N/A;  1245   ESOPHAGOGASTRODUODENOSCOPY N/A 12/17/2016   Procedure: ESOPHAGOGASTRODUODENOSCOPY (EGD);  Surgeon: Golda Claudis PENNER, MD;  [facility];  Service: Endoscopy;  Laterality: N/A;  210   ESOPHAGOGASTRODUODENOSCOPY N/A 06/30/2020   Procedure: ESOPHAGOGASTRODUODENOSCOPY (EGD);  Surgeon: Golda Claudis PENNER, MD;  [facility];  Service: Endoscopy;  Laterality: N/A;  11:15   ESOPHAGOGASTRODUODENOSCOPY (EGD) WITH PROPOFOL  N/A 02/10/2020   Procedure: ESOPHAGOGASTRODUODENOSCOPY (EGD) WITH PROPOFOL ;  Surgeon: Golda Claudis PENNER, MD;  [facility];  Service: Endoscopy;  Laterality: N/A;   ESOPHAGOGASTRODUODENOSCOPY (EGD) WITH PROPOFOL  N/A 11/04/2020   Procedure: ESOPHAGOGASTRODUODENOSCOPY (EGD) WITH PROPOFOL ;  Surgeon: Eartha Angelia Sieving, MD;  [facility];  Service: Gastroenterology;  Laterality: N/A;  9:45   I & D KNEE WITH POLY EXCHANGE Right 04/02/2016   Procedure: IRRIGATION AND DEBRIDEMENT RIGHT KNEE WITH POLY EXCHANGE;  Surgeon: Marcey Raman, MD;  [facility]: [facility];  Service: Orthopedics;  Laterality: Right;   INCISION AND DRAINAGE HEMATOMA POST LEFT  TOTAL KNEE  2012   INGUINAL HERNIA REPAIR Bilateral 05/22/2013   Procedure: REPAIR OF RECURRENT INCARCERATED INGUINAL HERNIA WITH MESH RIGHT SIDE,  REPAIR OF RECURRENT INGUINAL HERNIA WITH MESH LEFT SIDE;  Surgeon: Elon CHRISTELLA Pacini, MD;  Location: [facility] OR;  Service: General;  Laterality: Bilateral;   INGUINAL HERNIA REPAIR Bilateral 11/1997   INSERTION OF MESH Bilateral 05/22/2013   Procedure: INSERTION OF MESH;  Surgeon: Elon CHRISTELLA Pacini, MD;  Location: [facility];  Service: General;  Laterality: Bilateral;   IRRIGATION AND DEBRIDEMENT  KNEE Right 02/22/2016   Procedure: IRRIGATION AND DEBRIDEMENT KNEE;  Surgeon: Marcey Raman, MD;  Location: [facility];  Service: Orthopedics;  Laterality: Right;   Irrigation and Debridement right knee  03/23/2009   Dr. Cleotilde, [facility]   KNEE ARTHROSCOPY W/ SYNOVECTOMY Right 01/30/2010   AND DEBRIDEMENT OF HETEROTOPIC   LIVER BIOPSY  09/30/2012  REVISION TOTAL KNEE ARTHROPLASTY Left fall 2012   TONSILLECTOMY  child   TOTAL KNEE ARTHROPLASTY Bilateral right 09/ 2010/  left 09-18-2010   TRANSTHORACIC ECHOCARDIOGRAM  02/22/2016   ef 30-35% (per cardiac cath 02-24-2016 normal), severe hypokinesis of the mid-apicalanteroseptal and apical myocardium,  grade 1 diastolic dysfunction/ trivial PR and TR   TRIGGER FINGER RELEASE Left 02/11/2019   Procedure: LEFT WRIST STENOSING TENOSYNOVITIS RELEASE;  Surgeon: Sissy Cough, MD;  Location: [facility] OR;  Service: Orthopedics;  Laterality: Left;  MAC WITH AXILLARY BLOCK   WRIST ARTHROPLASTY Left 10/17/2017   Procedure: CAPITATE RESURFACING ARTHROPLASTY;  Surgeon: Sissy Cough, MD;  Location: [facility];  Service: Orthopedics;  Laterality: Left;    Social History: Social History   Socioeconomic History   Marital status: Legally Separated    Spouse name: Not on file   Number of children: 2   Years of education: Not on file   Highest education level: 12th grade  Occupational History    Employer: [employer]    Comment: part time  Tobacco Use   Smoking status: Former    Current packs/day: 0.00    Average packs/day: 0.3 packs/day for 3.0 years (0.8 ttl pk-yrs)    Types: Cigarettes    Start date: 10/10/1971    Quit date: 10/10/1974    Years since quitting: 49.3    Passive exposure: Past   Smokeless tobacco: Never  Vaping Use   Vaping status: Never Used  Substance and Sexual Activity   Alcohol use: Not Currently   Drug use: No   Sexual activity: Yes  Other Topics Concern   Not on file  Social History Narrative   ** Merged History  Encounter **       Pt lives alone.   Social Drivers of Corporate investment banker Strain: Low Risk  (11/16/2019)   Overall Financial Resource Strain (CARDIA)    Difficulty of Paying Living Expenses: Not hard at all  Food Insecurity: No Food Insecurity (12/12/2023)   Hunger Vital Sign    Worried About Running Out of Food in the Last Year: Never true    Ran Out of Food in the Last Year: Never true  Transportation Needs: No Transportation Needs (02/28/2021)   PRAPARE - Administrator, Civil Service (Medical): No    Lack of Transportation (Non-Medical): No  Physical Activity: Inactive (11/16/2019)   Exercise Vital Sign    Days of Exercise per Week: 0 days    Minutes of Exercise per Session: 0 min  Stress: No Stress Concern Present (11/16/2019)   [employer] of Occupational Health - Occupational Stress Questionnaire    Feeling of Stress : Not at all  Social Connections: Moderately Isolated (11/16/2019)   Social Connection and Isolation Panel    Frequency of Communication with Friends and Family: More than three times a week    Frequency of Social Gatherings with Friends and Family: More than three times a week    Attends Religious Services: More than 4 times per year    Active Member of [employer] or Organizations: No    Attends Banker Meetings: Never    Marital Status: Separated  Intimate Partner Violence: Not At Risk (12/12/2023)   Humiliation, Afraid, Rape, and Kick questionnaire    Fear of Current or Ex-Partner: No    Emotionally Abused: No    Physically Abused: No    Sexually Abused: No    Family History: Family History  Problem Relation Age  of Onset   Arthritis Father        died age 101   GI Bleed Father        upper GI Bleed, non-ETOH cirrhosis   Arthritis Brother    Diabetes Brother        type 2   Diabetes Paternal Uncle        type 2   Heart attack Maternal Grandmother    Heart attack Maternal Grandfather    Colon cancer Neg Hx    Colon  polyps Neg Hx     Current Medications:  Current Outpatient Medications:    cetirizine  (ZYRTEC ) 10 MG tablet, Take 10 mg by mouth daily., Disp: , Rfl:    EPINEPHrine  (PRIMATENE  MIST) 0.125 MG/ACT AERO, Inhale 1 puff into the lungs daily as needed (asthma)., Disp: , Rfl:    ferrous sulfate  (FEROSUL) 325 (65 FE) MG tablet, TAKE 1 TABLET BY MOUTH DAILY WITH BREAKFAST, Disp: 60 tablet, Rfl: 5   furosemide  (LASIX ) 40 MG tablet, Take 2 tablets by mouth once daily, Disp: 180 tablet, Rfl: 0   glipiZIDE  (GLUCOTROL  XL) 10 MG 24 hr tablet, Take 10 mg by mouth daily with breakfast., Disp: , Rfl:    sildenafil (VIAGRA) 100 MG tablet, Take 100 mg by mouth daily as needed for erectile dysfunction., Disp: , Rfl:    spironolactone  (ALDACTONE ) 100 MG tablet, Take 2 tablets by mouth once daily, Disp: 180 tablet, Rfl: 0   triamcinolone  cream (KENALOG ) 0.5 %, Apply topically., Disp: , Rfl:    Allergies: Allergies  Allergen Reactions   Asa [Aspirin] Shortness Of Breath    asthma symptoms   Penicillins Shortness Of Breath    Has patient had a PCN reaction causing immediate rash, facial/tongue/throat swelling, SOB or lightheadedness with hypotension: Yes Has patient had a PCN reaction causing severe rash involving mucus membranes or skin necrosis: No Has patient had a PCN reaction that required hospitalization No Has patient had a PCN reaction occurring within the last 10 years: No If all of the above answers are NO, then may proceed with Cephalosporin use. asthma symptoms Patient has tolerated cefazolin , ceftriaxo   Vancomycin  Anaphylaxis    Immediately following being turned into prone position and Vancomycin  administration in the OR patient cardiac arrest w/  Vfib.    REVIEW OF SYSTEMS:   Review of Systems  Constitutional:  Negative for chills, fatigue and fever.  HENT:   Negative for lump/mass, mouth sores, nosebleeds, sore throat and trouble swallowing.   Eyes:  Negative for eye problems.   Respiratory:  Negative for cough and shortness of breath.   Cardiovascular:  Negative for chest pain, leg swelling and palpitations.  Gastrointestinal:  Negative for abdominal pain, constipation, diarrhea, nausea and vomiting.  Genitourinary:  Negative for bladder incontinence, difficulty urinating, dysuria, frequency, hematuria and nocturia.   Musculoskeletal:  Negative for arthralgias, back pain, flank pain, myalgias and neck pain.  Skin:  Negative for itching and rash.  Neurological:  Negative for dizziness, headaches and numbness.  Hematological:  Does not bruise/bleed easily.  Psychiatric/Behavioral:  Negative for depression, sleep disturbance and suicidal ideas. The patient is not nervous/anxious.   All other systems reviewed and are negative.    VITALS:   Blood pressure 137/66, pulse 67, temperature 98.5 F (36.9 C), temperature source Tympanic, resp. rate 18, weight 200 lb 9.9 oz (91 kg), SpO2 100%.  Wt Readings from Last 3 Encounters:  01/23/24 200 lb 9.9 oz (91 kg)  12/12/23 198 lb 13.7 oz (  90.2 kg)  12/09/23 197 lb 3.2 oz (89.4 kg)    Body mass index is 28.4 kg/m.   PHYSICAL EXAM:   Physical Exam Vitals and nursing note reviewed. Exam conducted with a chaperone present.  Constitutional:      Appearance: Normal appearance.  Cardiovascular:     Rate and Rhythm: Normal rate and regular rhythm.     Pulses: Normal pulses.     Heart sounds: Normal heart sounds.  Pulmonary:     Effort: Pulmonary effort is normal.     Breath sounds: Normal breath sounds.  Abdominal:     Palpations: Abdomen is soft. There is no hepatomegaly, splenomegaly or mass.     Tenderness: There is no abdominal tenderness.  Musculoskeletal:     Right lower leg: No edema.     Left lower leg: No edema.  Lymphadenopathy:     Cervical: No cervical adenopathy.     Right cervical: No superficial, deep or posterior cervical adenopathy.    Left cervical: No superficial, deep or posterior cervical  adenopathy.     Upper Body:     Right upper body: No supraclavicular or axillary adenopathy.     Left upper body: No supraclavicular or axillary adenopathy.  Neurological:     General: No focal deficit present.     Mental Status: He is alert and oriented to person, place, and time.  Psychiatric:        Mood and Affect: Mood normal.        Behavior: Behavior normal.     LABS:   CBC    Component Value Date/Time   WBC 2.7 (L) 01/23/2024 0934   RBC 4.13 (L) 01/23/2024 0934   HGB 13.2 01/23/2024 0934   HGB 14.7 04/27/2019 1147   HCT 38.0 (L) 01/23/2024 0934   HCT 42.8 04/27/2019 1147   PLT 65 (L) 01/23/2024 0934   PLT 78 (LL) 04/27/2019 1147   MCV 92.0 01/23/2024 0934   MCV 89 04/27/2019 1147   MCH 32.0 01/23/2024 0934   MCHC 34.7 01/23/2024 0934   RDW 13.3 01/23/2024 0934   RDW 14.2 04/27/2019 1147   LYMPHSABS 0.2 (L) 01/23/2024 0934   MONOABS 0.4 01/23/2024 0934   EOSABS 0.1 01/23/2024 0934   BASOSABS 0.0 01/23/2024 0934    CMP    Component Value Date/Time   NA 133 (L) 11/03/2023 0651   NA 141 04/27/2019 1147   K 3.6 11/03/2023 0651   CL 102 11/03/2023 0651   CO2 22 11/03/2023 0651   GLUCOSE 232 (H) 11/03/2023 0651   BUN 13 11/03/2023 0651   BUN 10 04/27/2019 1147   CREATININE 0.95 11/03/2023 0651   CREATININE 1.04 09/23/2023 1553   CALCIUM 8.5 (L) 11/03/2023 0651   PROT 6.6 11/03/2023 0651   PROT 6.5 04/27/2019 1147   ALBUMIN 3.9 11/03/2023 0651   ALBUMIN 4.3 04/27/2019 1147   AST 39 11/03/2023 0651   ALT 25 11/03/2023 0651   ALKPHOS 113 11/03/2023 0651   BILITOT 4.7 (H) 11/03/2023 0651   BILITOT 1.6 (H) 04/27/2019 1147   GFRNONAA >60 11/03/2023 0651   GFRAA >60 02/08/2020 1525    No results found for: CEA1, CEA / No results found for: CEA1, CEA Lab Results  Component Value Date   PSA1 3.6 04/27/2019   No results found for: CAN199 No results found for: RJW874  Lab Results  Component Value Date   TOTALPROTELP 6.2 12/12/2023    ALBUMINELP 3.5 12/12/2023   A1GS 0.2 12/12/2023  A2GS 0.4 12/12/2023   BETS 0.9 12/12/2023   GAMS 1.1 12/12/2023   MSPIKE Not Observed 12/12/2023   SPEI Comment 12/12/2023   Lab Results  Component Value Date   TIBC 245 (L) 01/23/2024   TIBC 389 12/12/2023   TIBC 360 12/09/2023   FERRITIN 105 01/23/2024   FERRITIN 40 12/12/2023   FERRITIN 56 12/09/2023   IRONPCTSAT 21 01/23/2024   IRONPCTSAT 15 (L) 12/12/2023   IRONPCTSAT 14 (L) 12/09/2023   Lab Results  Component Value Date   LDH 197 (H) 12/12/2023   LDH 140 07/30/2019   LDH 208 (H) 12/31/2018     STUDIES:   No results found.

## 2024-01-26 ENCOUNTER — Inpatient Hospital Stay (HOSPITAL_COMMUNITY)
Admission: EM | Admit: 2024-01-26 | Discharge: 2024-01-31 | DRG: 445 | Disposition: A | Discharge status: Home / Self Care | Attending: Internal Medicine | Admitting: Internal Medicine

## 2024-01-26 ENCOUNTER — Emergency Department (HOSPITAL_COMMUNITY)

## 2024-01-26 ENCOUNTER — Encounter (HOSPITAL_COMMUNITY): Payer: Self-pay | Admitting: Emergency Medicine

## 2024-01-26 ENCOUNTER — Other Ambulatory Visit: Payer: Self-pay

## 2024-01-26 DIAGNOSIS — Z96653 Presence of artificial knee joint, bilateral: Secondary | ICD-10-CM | POA: Diagnosis present

## 2024-01-26 DIAGNOSIS — I110 Hypertensive heart disease with heart failure: Secondary | ICD-10-CM | POA: Diagnosis present

## 2024-01-26 DIAGNOSIS — Z635 Disruption of family by separation and divorce: Secondary | ICD-10-CM

## 2024-01-26 DIAGNOSIS — K8020 Calculus of gallbladder without cholecystitis without obstruction: Secondary | ICD-10-CM | POA: Diagnosis not present

## 2024-01-26 DIAGNOSIS — I8500 Esophageal varices without bleeding: Secondary | ICD-10-CM | POA: Diagnosis not present

## 2024-01-26 DIAGNOSIS — Z881 Allergy status to other antibiotic agents status: Secondary | ICD-10-CM

## 2024-01-26 DIAGNOSIS — I8510 Secondary esophageal varices without bleeding: Secondary | ICD-10-CM | POA: Diagnosis present

## 2024-01-26 DIAGNOSIS — M19032 Primary osteoarthritis, left wrist: Secondary | ICD-10-CM | POA: Diagnosis present

## 2024-01-26 DIAGNOSIS — Z87891 Personal history of nicotine dependence: Secondary | ICD-10-CM

## 2024-01-26 DIAGNOSIS — K810 Acute cholecystitis: Principal | ICD-10-CM

## 2024-01-26 DIAGNOSIS — Z87442 Personal history of urinary calculi: Secondary | ICD-10-CM

## 2024-01-26 DIAGNOSIS — K8000 Calculus of gallbladder with acute cholecystitis without obstruction: Secondary | ICD-10-CM | POA: Diagnosis not present

## 2024-01-26 DIAGNOSIS — R509 Fever, unspecified: Secondary | ICD-10-CM | POA: Diagnosis not present

## 2024-01-26 DIAGNOSIS — Z7984 Long term (current) use of oral hypoglycemic drugs: Secondary | ICD-10-CM

## 2024-01-26 DIAGNOSIS — E119 Type 2 diabetes mellitus without complications: Secondary | ICD-10-CM | POA: Diagnosis present

## 2024-01-26 DIAGNOSIS — E039 Hypothyroidism, unspecified: Secondary | ICD-10-CM | POA: Diagnosis present

## 2024-01-26 DIAGNOSIS — K820 Obstruction of gallbladder: Secondary | ICD-10-CM | POA: Diagnosis not present

## 2024-01-26 DIAGNOSIS — J45909 Unspecified asthma, uncomplicated: Secondary | ICD-10-CM | POA: Diagnosis present

## 2024-01-26 DIAGNOSIS — D509 Iron deficiency anemia, unspecified: Secondary | ICD-10-CM | POA: Diagnosis present

## 2024-01-26 DIAGNOSIS — Z79899 Other long term (current) drug therapy: Secondary | ICD-10-CM

## 2024-01-26 DIAGNOSIS — M19031 Primary osteoarthritis, right wrist: Secondary | ICD-10-CM | POA: Diagnosis present

## 2024-01-26 DIAGNOSIS — Z86718 Personal history of other venous thrombosis and embolism: Secondary | ICD-10-CM

## 2024-01-26 DIAGNOSIS — R161 Splenomegaly, not elsewhere classified: Secondary | ICD-10-CM | POA: Diagnosis not present

## 2024-01-26 DIAGNOSIS — K219 Gastro-esophageal reflux disease without esophagitis: Secondary | ICD-10-CM | POA: Diagnosis present

## 2024-01-26 DIAGNOSIS — Z860101 Personal history of adenomatous and serrated colon polyps: Secondary | ICD-10-CM

## 2024-01-26 DIAGNOSIS — Z8674 Personal history of sudden cardiac arrest: Secondary | ICD-10-CM

## 2024-01-26 DIAGNOSIS — K2980 Duodenitis without bleeding: Secondary | ICD-10-CM | POA: Diagnosis not present

## 2024-01-26 DIAGNOSIS — R1011 Right upper quadrant pain: Secondary | ICD-10-CM | POA: Diagnosis not present

## 2024-01-26 DIAGNOSIS — D696 Thrombocytopenia, unspecified: Secondary | ICD-10-CM | POA: Diagnosis not present

## 2024-01-26 DIAGNOSIS — K819 Cholecystitis, unspecified: Secondary | ICD-10-CM | POA: Diagnosis not present

## 2024-01-26 DIAGNOSIS — K766 Portal hypertension: Secondary | ICD-10-CM | POA: Diagnosis not present

## 2024-01-26 DIAGNOSIS — K7581 Nonalcoholic steatohepatitis (NASH): Secondary | ICD-10-CM | POA: Diagnosis not present

## 2024-01-26 DIAGNOSIS — K7460 Unspecified cirrhosis of liver: Secondary | ICD-10-CM | POA: Diagnosis present

## 2024-01-26 DIAGNOSIS — Z833 Family history of diabetes mellitus: Secondary | ICD-10-CM

## 2024-01-26 DIAGNOSIS — Z8619 Personal history of other infectious and parasitic diseases: Secondary | ICD-10-CM

## 2024-01-26 DIAGNOSIS — R188 Other ascites: Secondary | ICD-10-CM | POA: Diagnosis not present

## 2024-01-26 DIAGNOSIS — K2270 Barrett's esophagus without dysplasia: Secondary | ICD-10-CM | POA: Diagnosis present

## 2024-01-26 DIAGNOSIS — Z8261 Family history of arthritis: Secondary | ICD-10-CM

## 2024-01-26 DIAGNOSIS — I5022 Chronic systolic (congestive) heart failure: Secondary | ICD-10-CM | POA: Diagnosis present

## 2024-01-26 DIAGNOSIS — Z8679 Personal history of other diseases of the circulatory system: Secondary | ICD-10-CM

## 2024-01-26 DIAGNOSIS — I864 Gastric varices: Secondary | ICD-10-CM | POA: Diagnosis present

## 2024-01-26 DIAGNOSIS — I4891 Unspecified atrial fibrillation: Secondary | ICD-10-CM | POA: Diagnosis present

## 2024-01-26 DIAGNOSIS — R932 Abnormal findings on diagnostic imaging of liver and biliary tract: Secondary | ICD-10-CM | POA: Diagnosis not present

## 2024-01-26 DIAGNOSIS — Z886 Allergy status to analgesic agent status: Secondary | ICD-10-CM

## 2024-01-26 DIAGNOSIS — Z8249 Family history of ischemic heart disease and other diseases of the circulatory system: Secondary | ICD-10-CM

## 2024-01-26 DIAGNOSIS — Z880 Allergy status to penicillin: Secondary | ICD-10-CM

## 2024-01-26 DIAGNOSIS — E1169 Type 2 diabetes mellitus with other specified complication: Secondary | ICD-10-CM | POA: Diagnosis not present

## 2024-01-26 LAB — COMPREHENSIVE METABOLIC PANEL WITH GFR
ALT: 40 U/L (ref 0–44)
AST: 40 U/L (ref 15–41)
Albumin: 3.6 g/dL (ref 3.5–5.0)
Alkaline Phosphatase: 191 U/L — ABNORMAL HIGH (ref 38–126)
Anion gap: 13 (ref 5–15)
BUN: 15 mg/dL (ref 8–23)
CO2: 24 mmol/L (ref 22–32)
Calcium: 8.5 mg/dL — ABNORMAL LOW (ref 8.9–10.3)
Chloride: 102 mmol/L (ref 98–111)
Creatinine, Ser: 0.85 mg/dL (ref 0.61–1.24)
GFR, Estimated: >60 mL/min (ref >60)
Glucose, Bld: 192 mg/dL — ABNORMAL HIGH (ref 70–99)
Potassium: 3.7 mmol/L (ref 3.5–5.1)
Sodium: 139 mmol/L (ref 135–145)
Total Bilirubin: 3.9 mg/dL — ABNORMAL HIGH (ref 0.0–1.2)
Total Protein: 6.8 g/dL (ref 6.5–8.1)

## 2024-01-26 LAB — CBC
HCT: 38.3 % — ABNORMAL LOW (ref 39.0–52.0)
Hemoglobin: 13 g/dL (ref 13.0–17.0)
MCH: 31.7 pg (ref 26.0–34.0)
MCHC: 33.9 g/dL (ref 30.0–36.0)
MCV: 93.4 fL (ref 80.0–100.0)
Platelets: 71 K/uL — ABNORMAL LOW (ref 150–400)
RBC: 4.1 MIL/uL — ABNORMAL LOW (ref 4.22–5.81)
RDW: 13.2 % (ref 11.5–15.5)
WBC: 3.3 K/uL — ABNORMAL LOW (ref 4.0–10.5)
nRBC: 0 % (ref 0.0–0.2)

## 2024-01-26 LAB — URINALYSIS, ROUTINE W REFLEX MICROSCOPIC
Bilirubin Urine: NEGATIVE
Glucose, UA: NEGATIVE mg/dL
Hgb urine dipstick: NEGATIVE
Ketones, ur: NEGATIVE mg/dL
Leukocytes,Ua: NEGATIVE
Nitrite: NEGATIVE
Protein, ur: NEGATIVE mg/dL
Specific Gravity, Urine: 1.012 (ref 1.005–1.030)
pH: 5 (ref 5.0–8.0)

## 2024-01-26 LAB — LIPASE, BLOOD: Lipase: 48 U/L (ref 11–51)

## 2024-01-26 MED ORDER — SODIUM CHLORIDE 0.9 % IV SOLN
2.0000 g | Freq: Once | INTRAVENOUS | Status: AC
  Administered 2024-01-26: 2 g via INTRAVENOUS
  Filled 2024-01-26: qty 12.5

## 2024-01-26 MED ORDER — METRONIDAZOLE 500 MG/100ML IV SOLN
500.0000 mg | Freq: Once | INTRAVENOUS | Status: AC
  Administered 2024-01-26: 500 mg via INTRAVENOUS
  Filled 2024-01-26: qty 100

## 2024-01-26 MED ORDER — IOHEXOL 300 MG/ML  SOLN
100.0000 mL | Freq: Once | INTRAMUSCULAR | Status: AC | PRN
  Administered 2024-01-26: 100 mL via INTRAVENOUS

## 2024-01-26 MED ORDER — HYDROMORPHONE HCL 1 MG/ML IJ SOLN
0.5000 mg | Freq: Once | INTRAMUSCULAR | Status: AC
  Administered 2024-01-26: 0.5 mg via INTRAVENOUS
  Filled 2024-01-26: qty 0.5

## 2024-01-26 MED ORDER — SODIUM CHLORIDE 0.9 % IV BOLUS
500.0000 mL | Freq: Once | INTRAVENOUS | Status: AC
  Administered 2024-01-26: 500 mL via INTRAVENOUS

## 2024-01-26 MED ORDER — HYDROMORPHONE HCL 1 MG/ML IJ SOLN
0.5000 mg | Freq: Once | INTRAMUSCULAR | Status: AC
  Administered 2024-01-27: 0.5 mg via INTRAVENOUS
  Filled 2024-01-26: qty 0.5

## 2024-01-26 MED ORDER — ONDANSETRON HCL 4 MG/2ML IJ SOLN
4.0000 mg | Freq: Once | INTRAMUSCULAR | Status: AC
  Administered 2024-01-26: 4 mg via INTRAVENOUS
  Filled 2024-01-26: qty 2

## 2024-01-26 NOTE — Progress Notes (Signed)
 [facility]  Reviewed patient's imaging and discussed Dr. Gennaro. Concern for acute cholecystitis in known cirrhotic. I have seen the patient as an outpatient and discussed with his GI, Dr. Eartha. We had felt that if he ever came to needing cholecystectomy with his cirrhosis and portal hypertension he would be better served at a tertiary care center. See my note from May 2025.   Manuelita Pander, MD [facility] [address] [place], [place] [address] [phone] (office)

## 2024-01-26 NOTE — ED Triage Notes (Signed)
 Pt c/o RUQ pain that started this afternoon. Denies N/V/D

## 2024-01-26 NOTE — ED Provider Notes (Signed)
 [place] EMERGENCY DEPARTMENT AT [facility] Provider Note   CSN: [id number] Arrival date & time: 01/26/24  2106     Patient presents with: Abdominal Pain   Stephen Burch is a 68 y.o. male.   68 year old male presents for evaluation of right upper quadrant abdominal pain.  He has a history of Hollie cirrhosis and a history of gallstones in the past.  States I was post have my gallbladder removed.  Patient states he developed right upper quadrant pain today that is severe, nonradiating and makes him feel nauseous.  He has not had any vomiting or diarrhea.  Denies any other symptoms or concerns.   Abdominal Pain Associated symptoms: nausea   Associated symptoms: no chest pain, no chills, no cough, no dysuria, no fever, no hematuria, no shortness of breath, no sore throat and no vomiting        Prior to Admission medications   Medication Sig Start Date End Date Taking? Authorizing Provider  cetirizine  (ZYRTEC ) 10 MG tablet Take 10 mg by mouth daily.    [provider]  EPINEPHrine  (PRIMATENE  MIST) 0.125 MG/ACT AERO Inhale 1 puff into the lungs daily as needed (asthma).    [provider]  ferrous sulfate  (FEROSUL) 325 (65 FE) MG tablet TAKE 1 TABLET BY MOUTH DAILY WITH BREAKFAST 01/14/24   Carlan, [name] L, NP  furosemide  (LASIX ) 40 MG tablet Take 2 tablets by mouth once daily 11/21/23   Carlan, [name] L, NP  glipiZIDE  (GLUCOTROL  XL) 10 MG 24 hr tablet Take 10 mg by mouth daily with breakfast.    [provider]  sildenafil (VIAGRA) 100 MG tablet Take 100 mg by mouth daily as needed for erectile dysfunction.    [provider]  spironolactone  (ALDACTONE ) 100 MG tablet Take 2 tablets by mouth once daily 11/20/23   Carlan, [name] L, NP  triamcinolone  cream (KENALOG ) 0.5 % Apply topically. 12/10/23   [provider]    Allergies: Asa [aspirin], Penicillins, and Vancomycin     Review of Systems  Constitutional:  Negative for  chills and fever.  HENT:  Negative for ear pain and sore throat.   Eyes:  Negative for pain and visual disturbance.  Respiratory:  Negative for cough and shortness of breath.   Cardiovascular:  Negative for chest pain and palpitations.  Gastrointestinal:  Positive for abdominal pain and nausea. Negative for vomiting.  Genitourinary:  Negative for dysuria and hematuria.  Musculoskeletal:  Negative for arthralgias and back pain.  Skin:  Negative for color change and rash.  Neurological:  Negative for seizures and syncope.  All other systems reviewed and are negative.   Updated Vital Signs BP (!) 141/83   Pulse 71   Temp 98.1 F (36.7 C)   Resp 18   Ht 5' 10 (1.778 m)   Wt 91 kg   SpO2 98%   BMI 28.79 kg/m   Physical Exam Vitals and nursing note reviewed.  Constitutional:      General: He is not in acute distress.    Appearance: He is well-developed. He is ill-appearing.  HENT:     Head: Normocephalic and atraumatic.  Eyes:     Conjunctiva/sclera: Conjunctivae normal.  Cardiovascular:     Rate and Rhythm: Normal rate and regular rhythm.     Heart sounds: No murmur heard. Pulmonary:     Effort: Pulmonary effort is normal. No respiratory distress.     Breath sounds: Normal breath sounds.  Abdominal:     Palpations:  Abdomen is soft.     Tenderness: There is abdominal tenderness in the right upper quadrant.  Musculoskeletal:        General: No swelling.     Cervical back: Neck supple.  Skin:    General: Skin is warm and dry.     Capillary Refill: Capillary refill takes less than 2 seconds.  Neurological:     Mental Status: He is alert.  Psychiatric:        Mood and Affect: Mood normal.     (all labs ordered are listed, but only abnormal results are displayed) Labs Reviewed  COMPREHENSIVE METABOLIC PANEL WITH GFR - Abnormal; Notable for the following components:      Result Value   Glucose, Bld 192 (*)    Calcium 8.5 (*)    Alkaline Phosphatase 191 (*)    Total  Bilirubin 3.9 (*)    All other components within normal limits  CBC - Abnormal; Notable for the following components:   WBC 3.3 (*)    RBC 4.10 (*)    HCT 38.3 (*)    Platelets 71 (*)    All other components within normal limits  LIPASE, BLOOD  URINALYSIS, ROUTINE W REFLEX MICROSCOPIC    EKG: None  Radiology: CT ABDOMEN PELVIS W CONTRAST Result Date: 01/26/2024 CLINICAL DATA:  Right upper quadrant EXAM: CT ABDOMEN AND PELVIS WITH CONTRAST TECHNIQUE: Multidetector CT imaging of the abdomen and pelvis was performed using the standard protocol following bolus administration of intravenous contrast. RADIATION DOSE REDUCTION: This exam was performed according to the departmental dose-optimization program which includes automated exposure control, adjustment of the mA and/or kV according to patient size and/or use of iterative reconstruction technique. CONTRAST:  OMNIPAQUE  IOHEXOL  300 MG/ML  SOLN COMPARISON:  MRI of the abdomen 12/16/2023. FINDINGS: Lower chest: No acute abnormality. Hepatobiliary: Nodular liver contour is again seen compatible with cirrhosis. Gallstones are present. The gallbladder is distended there is mild inflammatory stranding surrounding the gallbladder. No biliary ductal dilatation identified. Pancreas: Unremarkable. No pancreatic ductal dilatation or surrounding inflammatory changes. Spleen: The spleen is enlarged, unchanged. Adrenals/Urinary Tract: Rounded hypodensities in the kidneys are too small to characterize, likely cyst. The adrenal glands, kidneys and bladder are otherwise within normal limits. Stomach/Bowel: There is diffuse colonic wall thickening most significant in the ascending colon. There is also diffuse wall thickening and inflammation of the entire duodenum. Appendix is not definitely seen. There is no bowel obstruction, pneumatosis or free air. There is wall thickening of the distal esophagus. Vascular/Lymphatic: Aorta and IVC are normal in size. No  enlarged lymph nodes are seen. Left-sided varicocele and splenic varices as well as gastric varices are again noted. Reproductive: Prostate gland is mildly enlarged. Other: There is small volume ascites. Musculoskeletal: Degenerative changes affect the spine. IMPRESSION: 1. Cholelithiasis with distended gallbladder and mild inflammatory stranding surrounding the gallbladder. Findings are concerning for acute cholecystitis. 2. Diffuse colonic wall thickening most significant in the ascending colon worrisome for colitis. 3. Diffuse wall thickening and inflammation of the duodenum compatible with duodenitis. 4. Cirrhosis with sequela of portal hypertension including splenomegaly, varices, and small volume ascites. 5. Wall thickening of the distal esophagus worrisome for esophagitis. Electronically Signed   By: Greig Pique M.D.   On: 01/26/2024 23:08     Procedures   Medications Ordered in the ED  ceFEPIme  (MAXIPIME ) 2 g in sodium chloride  0.9 % 100 mL IVPB (2 g Intravenous New Bag/Given 01/26/24 2341)  metroNIDAZOLE  (FLAGYL ) IVPB 500 mg (500  mg Intravenous New Bag/Given 01/26/24 2341)  HYDROmorphone  (DILAUDID ) injection 0.5 mg (has no administration in time range)  HYDROmorphone  (DILAUDID ) injection 0.5 mg (0.5 mg Intravenous Given 01/26/24 2238)  ondansetron  (ZOFRAN ) injection 4 mg (4 mg Intravenous Given 01/26/24 2238)  sodium chloride  0.9 % bolus 500 mL (0 mLs Intravenous Stopped 01/26/24 2339)  iohexol  (OMNIPAQUE ) 300 MG/ML solution 100 mL (100 mLs Intravenous Contrast Given 01/26/24 2251)                                    Medical Decision Making Patient here for abdominal pain that started this evening.  Has a history of gallstones.  Pain is located in the right upper quadrant.  He has stable vital signs but appears uncomfortable on exam.  Positive Murphy sign.  Imaging and lab work as discussed below.  I did give him pain medications fluids and Zofran  through the IV.  Will also start him on  antibiotics to cover for acute cholecystitis.  Patient's case discussed with Dr. Kallie, general surgery. She will come and evaluate the patient at bedside. Patient signed out to night time provider at 11:30 pm pending remainder of workup and ultimate disposition.   Problems Addressed: Acute cholecystitis: acute illness or injury that poses a threat to life or bodily functions  Amount and/or Complexity of Data Reviewed External Data Reviewed: notes.    Details: Outpatient records reviewed - patient has followed up with Dr. Kallie before for cholelithiasis in the office Labs: ordered. Decision-making details documented in ED Course.    Details: Ordered and reviewed by me.  Patient has elevated T bilirubin and alk phos but these are about baseline for him.  He has a leukopenia which is also a baseline for him Radiology: ordered and independent interpretation performed. Decision-making details documented in ED Course.    Details: Ordered and reviewed and patient with evidence of possible acute cholecystitis on the CT abdomen pelvis Discussion of management or test interpretation with external provider(s): Dr. Kallie - general surgery, she will evaluate the patient at bedside.   Risk OTC drugs. Prescription drug management. Drug therapy requiring intensive monitoring for toxicity. Decision regarding hospitalization. Emergency major surgery.    Final diagnoses:  Acute cholecystitis    ED Discharge Orders     None          Gennaro Duwaine CROME, DO 01/26/24 2350

## 2024-01-26 NOTE — ED Notes (Signed)
 See triage notes. Abd distended but soft. Stated supposed to have his gallbladder removed but last surgeon couldn't do it due to hx of Cirrhosis. NAD

## 2024-01-26 NOTE — ED Notes (Signed)
 Surgeon at bedside.

## 2024-01-26 NOTE — ED Notes (Signed)
 Pt returned from ct

## 2024-01-26 NOTE — ED Notes (Signed)
 ED Provider at bedside.

## 2024-01-27 ENCOUNTER — Emergency Department (HOSPITAL_COMMUNITY)

## 2024-01-27 ENCOUNTER — Encounter (HOSPITAL_COMMUNITY): Payer: Self-pay

## 2024-01-27 DIAGNOSIS — K766 Portal hypertension: Secondary | ICD-10-CM

## 2024-01-27 DIAGNOSIS — D696 Thrombocytopenia, unspecified: Secondary | ICD-10-CM | POA: Diagnosis not present

## 2024-01-27 DIAGNOSIS — Z7984 Long term (current) use of oral hypoglycemic drugs: Secondary | ICD-10-CM | POA: Diagnosis not present

## 2024-01-27 DIAGNOSIS — E039 Hypothyroidism, unspecified: Secondary | ICD-10-CM | POA: Diagnosis not present

## 2024-01-27 DIAGNOSIS — I8500 Esophageal varices without bleeding: Secondary | ICD-10-CM | POA: Diagnosis not present

## 2024-01-27 DIAGNOSIS — E1169 Type 2 diabetes mellitus with other specified complication: Secondary | ICD-10-CM

## 2024-01-27 DIAGNOSIS — I8510 Secondary esophageal varices without bleeding: Secondary | ICD-10-CM | POA: Diagnosis not present

## 2024-01-27 DIAGNOSIS — K219 Gastro-esophageal reflux disease without esophagitis: Secondary | ICD-10-CM | POA: Diagnosis not present

## 2024-01-27 DIAGNOSIS — R932 Abnormal findings on diagnostic imaging of liver and biliary tract: Secondary | ICD-10-CM | POA: Diagnosis not present

## 2024-01-27 DIAGNOSIS — Z8249 Family history of ischemic heart disease and other diseases of the circulatory system: Secondary | ICD-10-CM | POA: Diagnosis not present

## 2024-01-27 DIAGNOSIS — K7460 Unspecified cirrhosis of liver: Secondary | ICD-10-CM | POA: Diagnosis not present

## 2024-01-27 DIAGNOSIS — Z860101 Personal history of adenomatous and serrated colon polyps: Secondary | ICD-10-CM | POA: Diagnosis not present

## 2024-01-27 DIAGNOSIS — K8000 Calculus of gallbladder with acute cholecystitis without obstruction: Secondary | ICD-10-CM | POA: Diagnosis not present

## 2024-01-27 DIAGNOSIS — K820 Obstruction of gallbladder: Secondary | ICD-10-CM | POA: Diagnosis not present

## 2024-01-27 DIAGNOSIS — Z87891 Personal history of nicotine dependence: Secondary | ICD-10-CM | POA: Diagnosis not present

## 2024-01-27 DIAGNOSIS — Z8679 Personal history of other diseases of the circulatory system: Secondary | ICD-10-CM | POA: Diagnosis not present

## 2024-01-27 DIAGNOSIS — K819 Cholecystitis, unspecified: Secondary | ICD-10-CM | POA: Diagnosis not present

## 2024-01-27 DIAGNOSIS — K8020 Calculus of gallbladder without cholecystitis without obstruction: Secondary | ICD-10-CM | POA: Diagnosis not present

## 2024-01-27 DIAGNOSIS — R161 Splenomegaly, not elsewhere classified: Secondary | ICD-10-CM | POA: Diagnosis not present

## 2024-01-27 DIAGNOSIS — Z96653 Presence of artificial knee joint, bilateral: Secondary | ICD-10-CM | POA: Diagnosis not present

## 2024-01-27 DIAGNOSIS — Z833 Family history of diabetes mellitus: Secondary | ICD-10-CM | POA: Diagnosis not present

## 2024-01-27 DIAGNOSIS — J45909 Unspecified asthma, uncomplicated: Secondary | ICD-10-CM | POA: Diagnosis not present

## 2024-01-27 DIAGNOSIS — K810 Acute cholecystitis: Principal | ICD-10-CM

## 2024-01-27 DIAGNOSIS — R1011 Right upper quadrant pain: Secondary | ICD-10-CM | POA: Diagnosis not present

## 2024-01-27 DIAGNOSIS — K7581 Nonalcoholic steatohepatitis (NASH): Secondary | ICD-10-CM

## 2024-01-27 DIAGNOSIS — K2270 Barrett's esophagus without dysplasia: Secondary | ICD-10-CM | POA: Diagnosis not present

## 2024-01-27 DIAGNOSIS — I110 Hypertensive heart disease with heart failure: Secondary | ICD-10-CM | POA: Diagnosis not present

## 2024-01-27 DIAGNOSIS — K2980 Duodenitis without bleeding: Secondary | ICD-10-CM | POA: Diagnosis not present

## 2024-01-27 DIAGNOSIS — R188 Other ascites: Secondary | ICD-10-CM | POA: Diagnosis not present

## 2024-01-27 DIAGNOSIS — Z86718 Personal history of other venous thrombosis and embolism: Secondary | ICD-10-CM | POA: Diagnosis not present

## 2024-01-27 DIAGNOSIS — I864 Gastric varices: Secondary | ICD-10-CM

## 2024-01-27 DIAGNOSIS — I5022 Chronic systolic (congestive) heart failure: Secondary | ICD-10-CM | POA: Diagnosis not present

## 2024-01-27 DIAGNOSIS — Z880 Allergy status to penicillin: Secondary | ICD-10-CM | POA: Diagnosis not present

## 2024-01-27 DIAGNOSIS — E119 Type 2 diabetes mellitus without complications: Secondary | ICD-10-CM | POA: Diagnosis not present

## 2024-01-27 DIAGNOSIS — I4891 Unspecified atrial fibrillation: Secondary | ICD-10-CM | POA: Diagnosis not present

## 2024-01-27 DIAGNOSIS — D509 Iron deficiency anemia, unspecified: Secondary | ICD-10-CM | POA: Diagnosis not present

## 2024-01-27 LAB — CBC WITH DIFFERENTIAL/PLATELET
Abs Immature Granulocytes: 0.01 K/uL (ref 0.00–0.07)
Basophils Absolute: 0 K/uL (ref 0.0–0.1)
Basophils Relative: 0 %
Eosinophils Absolute: 0 K/uL (ref 0.0–0.5)
Eosinophils Relative: 0 %
HCT: 37.9 % — ABNORMAL LOW (ref 39.0–52.0)
Hemoglobin: 13.1 g/dL (ref 13.0–17.0)
Immature Granulocytes: 0 %
Lymphocytes Relative: 4 %
Lymphs Abs: 0.2 K/uL — ABNORMAL LOW (ref 0.7–4.0)
MCH: 32.2 pg (ref 26.0–34.0)
MCHC: 34.6 g/dL (ref 30.0–36.0)
MCV: 93.1 fL (ref 80.0–100.0)
Monocytes Absolute: 0.5 K/uL (ref 0.1–1.0)
Monocytes Relative: 11 %
Neutro Abs: 4.1 K/uL (ref 1.7–7.7)
Neutrophils Relative %: 85 %
Platelets: 66 K/uL — ABNORMAL LOW (ref 150–400)
RBC: 4.07 MIL/uL — ABNORMAL LOW (ref 4.22–5.81)
RDW: 13.3 % (ref 11.5–15.5)
WBC: 4.8 K/uL (ref 4.0–10.5)
nRBC: 0 % (ref 0.0–0.2)

## 2024-01-27 LAB — COMPREHENSIVE METABOLIC PANEL WITH GFR
ALT: 41 U/L (ref 0–44)
AST: 42 U/L — ABNORMAL HIGH (ref 15–41)
Albumin: 3.2 g/dL — ABNORMAL LOW (ref 3.5–5.0)
Alkaline Phosphatase: 179 U/L — ABNORMAL HIGH (ref 38–126)
Anion gap: 6 (ref 5–15)
BUN: 15 mg/dL (ref 8–23)
CO2: 26 mmol/L (ref 22–32)
Calcium: 8.2 mg/dL — ABNORMAL LOW (ref 8.9–10.3)
Chloride: 104 mmol/L (ref 98–111)
Creatinine, Ser: 0.77 mg/dL (ref 0.61–1.24)
GFR, Estimated: >60 mL/min (ref >60)
Glucose, Bld: 165 mg/dL — ABNORMAL HIGH (ref 70–99)
Potassium: 3.9 mmol/L (ref 3.5–5.1)
Sodium: 136 mmol/L (ref 135–145)
Total Bilirubin: 4.8 mg/dL — ABNORMAL HIGH (ref 0.0–1.2)
Total Protein: 6.3 g/dL — ABNORMAL LOW (ref 6.5–8.1)

## 2024-01-27 LAB — GLUCOSE, CAPILLARY: Glucose-Capillary: 215 mg/dL — ABNORMAL HIGH (ref 70–99)

## 2024-01-27 MED ORDER — ORAL CARE MOUTH RINSE: 15.0000 mL | OROMUCOSAL | Status: DC | PRN

## 2024-01-27 MED ORDER — SODIUM CHLORIDE 0.9 % IV SOLN: INTRAVENOUS | Status: AC

## 2024-01-27 MED ORDER — METRONIDAZOLE 500 MG/100ML IV SOLN
500.0000 mg | Freq: Two times a day (BID) | INTRAVENOUS | Status: DC
  Administered 2024-01-27 (×2): 500 mg via INTRAVENOUS
  Filled 2024-01-27 (×2): qty 100

## 2024-01-27 MED ORDER — INSULIN ASPART 100 UNIT/ML IJ SOLN
0.0000 [IU] | Freq: Four times a day (QID) | INTRAMUSCULAR | Status: DC
  Administered 2024-01-27: 3 [IU] via SUBCUTANEOUS
  Administered 2024-01-28: 2 [IU] via SUBCUTANEOUS
  Administered 2024-01-28: 3 [IU] via SUBCUTANEOUS
  Administered 2024-01-29: 2 [IU] via SUBCUTANEOUS
  Administered 2024-01-29: 3 [IU] via SUBCUTANEOUS
  Administered 2024-01-29 (×2): 1 [IU] via SUBCUTANEOUS
  Administered 2024-01-30: 2 [IU] via SUBCUTANEOUS
  Administered 2024-01-30 – 2024-01-31 (×2): 1 [IU] via SUBCUTANEOUS

## 2024-01-27 MED ORDER — MORPHINE SULFATE (PF) 2 MG/ML IV SOLN
2.0000 mg | Freq: Once | INTRAVENOUS | Status: AC
  Administered 2024-01-27: 2 mg via INTRAVENOUS
  Filled 2024-01-27: qty 1

## 2024-01-27 MED ORDER — SODIUM CHLORIDE 0.9 % IV SOLN
2.0000 g | Freq: Three times a day (TID) | INTRAVENOUS | Status: DC
  Administered 2024-01-27 – 2024-01-28 (×4): 2 g via INTRAVENOUS
  Filled 2024-01-27 (×4): qty 12.5

## 2024-01-27 MED ORDER — ONDANSETRON HCL 4 MG/2ML IJ SOLN
4.0000 mg | Freq: Four times a day (QID) | INTRAMUSCULAR | Status: DC | PRN
  Administered 2024-01-27: 4 mg via INTRAVENOUS
  Filled 2024-01-27: qty 2

## 2024-01-27 MED ORDER — HYDROMORPHONE HCL 1 MG/ML IJ SOLN
1.0000 mg | INTRAMUSCULAR | Status: DC | PRN
  Administered 2024-01-27 – 2024-01-28 (×2): 1 mg via INTRAVENOUS
  Filled 2024-01-27 (×2): qty 1

## 2024-01-27 MED ORDER — HYDROMORPHONE HCL 1 MG/ML IJ SOLN
0.5000 mg | INTRAMUSCULAR | Status: DC | PRN
  Administered 2024-01-27 – 2024-01-29 (×3): 0.5 mg via INTRAVENOUS
  Filled 2024-01-27 (×3): qty 0.5

## 2024-01-27 MED ORDER — TECHNETIUM TC 99M MEBROFENIN IV KIT
5.2000 | PACK | Freq: Once | INTRAVENOUS | Status: AC | PRN
  Administered 2024-01-27: 5.2 via INTRAVENOUS

## 2024-01-27 MED ORDER — ACETAMINOPHEN 500 MG PO TABS
1000.0000 mg | ORAL_TABLET | Freq: Once | ORAL | Status: AC
  Administered 2024-01-27: 1000 mg via ORAL
  Filled 2024-01-27: qty 2

## 2024-01-27 MED ORDER — POLYETHYLENE GLYCOL 3350 17 G PO PACK
17.0000 g | PACK | Freq: Every day | ORAL | Status: DC | PRN
Start: 2024-01-27 — End: 2024-01-31

## 2024-01-27 NOTE — Progress Notes (Signed)
 [facility] Progress Note     Subjective: Stephen Burch confirmed this AM that they were full and would not be able to take the patient. He says his pain is better and overall feels better. He has received his antibiotics.   US  done this AM to see if any choledocholithiasis and to see if can get confirmation of acute cholecystitis. They are suspicious of cholecystitis. Given cirrhosis and need for transfer, I am getting the HIDA to confirm that he does have cholecystitis.   Objective: Vital signs in last 24 hours: Temp:  [98.1 F (36.7 C)-98.5 F (36.9 C)] 98.5 F (36.9 C) (07/28 0823) Pulse Rate:  [68-82] 79 (07/28 0823) Resp:  [12-22] 17 (07/28 0823) BP: (94-148)/(51-97) 135/67 (07/28 0823) SpO2:  [90 %-99 %] 96 % (07/28 0823) Weight:  [91 kg] 91 kg (07/27 2114)    Intake/Output from previous day: 07/27 0701 - 07/28 0700 In: 705.5 [IV Piggyback:705.5] Out: -  Intake/Output this shift: Total I/O In: 17.2 [I.V.:17.2] Out: -   General appearance: alert and no distress GI: soft, mildly distended, tender RUQ but less tender and allows for deeper palpation   Lab Results:  Recent Labs    01/26/24 2139 01/27/24 0723  WBC 3.3* 4.8  HGB 13.0 13.1  HCT 38.3* 37.9*  PLT 71* 66*   BMET Recent Labs    01/26/24 2139 01/27/24 0723  NA 139 136  K 3.7 3.9  CL 102 104  CO2 24 26  GLUCOSE 192* 165*  BUN 15 15  CREATININE 0.85 0.77  CALCIUM 8.5* 8.2*   PT/INR No results for input(s): LABPROT, INR in the last 72 hours.  Studies/Results: US  Abdomen Limited RUQ (LIVER/GB) Result Date: 01/27/2024 CLINICAL DATA:  Right upper quadrant abdominal pain for proximally 12 hours. EXAM: ULTRASOUND ABDOMEN LIMITED RIGHT UPPER QUADRANT COMPARISON:  Abdomen and pelvis CT dated 01/26/2024 and 11/03/2023. Right upper quadrant abdomen ultrasound dated 11/03/2023. FINDINGS: Gallbladder: Dilated with indistinct wall thickening measuring up to 7 mm in thickness. No pericholecystic  fluid. Sludge in the gallbladder. Multiple stones seen in the gallbladder neck and cystic duct on the CTs dated 01/26/2024 and 11/03/2023 are not visualized sonographically. This area is obscured by overlying bowel-gas. Positive sonographic Murphy sign. Common bile duct: Diameter: 4.1 mm Liver: Changes of cirrhosis with extensive nodular contours. Portal vein is poorly visualized on color Doppler imaging, patent on the CT obtained yesterday. The direction of flow was difficult to establish on today's exam. Other: None. IMPRESSION: 1. Findings suspicious for acute cholecystitis including multiple stones in the gallbladder neck and cystic duct on the recent CT, large enough to cause cystic duct obstruction; positive sonographic Murphy sign, diffuse gallbladder wall thickening, gallbladder dilatation and sludge in the gallbladder. 2. Changes of cirrhosis of the liver. 3. Poorly visualized portal vein on color Doppler, making direction of flow difficult to determine. Electronically Signed   By: Elspeth Bathe M.D.   On: 01/27/2024 10:41   CT ABDOMEN PELVIS W CONTRAST Result Date: 01/26/2024 CLINICAL DATA:  Right upper quadrant EXAM: CT ABDOMEN AND PELVIS WITH CONTRAST TECHNIQUE: Multidetector CT imaging of the abdomen and pelvis was performed using the standard protocol following bolus administration of intravenous contrast. RADIATION DOSE REDUCTION: This exam was performed according to the departmental dose-optimization program which includes automated exposure control, adjustment of the mA and/or kV according to patient size and/or use of iterative reconstruction technique. CONTRAST:  OMNIPAQUE  IOHEXOL  300 MG/ML  SOLN COMPARISON:  MRI of the abdomen 12/16/2023. FINDINGS:  Lower chest: No acute abnormality. Hepatobiliary: Nodular liver contour is again seen compatible with cirrhosis. Gallstones are present. The gallbladder is distended there is mild inflammatory stranding surrounding the gallbladder. No biliary  ductal dilatation identified. Pancreas: Unremarkable. No pancreatic ductal dilatation or surrounding inflammatory changes. Spleen: The spleen is enlarged, unchanged. Adrenals/Urinary Tract: Rounded hypodensities in the kidneys are too small to characterize, likely cyst. The adrenal glands, kidneys and bladder are otherwise within normal limits. Stomach/Bowel: There is diffuse colonic wall thickening most significant in the ascending colon. There is also diffuse wall thickening and inflammation of the entire duodenum. Appendix is not definitely seen. There is no bowel obstruction, pneumatosis or free air. There is wall thickening of the distal esophagus. Vascular/Lymphatic: Aorta and IVC are normal in size. No enlarged lymph nodes are seen. Left-sided varicocele and splenic varices as well as gastric varices are again noted. Reproductive: Prostate gland is mildly enlarged. Other: There is small volume ascites. Musculoskeletal: Degenerative changes affect the spine. IMPRESSION: 1. Cholelithiasis with distended gallbladder and mild inflammatory stranding surrounding the gallbladder. Findings are concerning for acute cholecystitis. 2. Diffuse colonic wall thickening most significant in the ascending colon worrisome for colitis. 3. Diffuse wall thickening and inflammation of the duodenum compatible with duodenitis. 4. Cirrhosis with sequela of portal hypertension including splenomegaly, varices, and small volume ascites. 5. Wall thickening of the distal esophagus worrisome for esophagitis. Electronically Signed   By: Greig Pique M.D.   On: 01/26/2024 23:08    Anti-infectives: Anti-infectives (From admission, onward)    Start     Dose/Rate Route Frequency Ordered Stop   01/27/24 0830  ceFEPIme  (MAXIPIME ) 2 g in sodium chloride  0.9 % 100 mL IVPB        2 g 200 mL/hr over 30 Minutes Intravenous Every 8 hours 01/27/24 0743     01/27/24 0700  metroNIDAZOLE  (FLAGYL ) IVPB 500 mg        500 mg 100 mL/hr over 60  Minutes Intravenous Every 12 hours 01/27/24 0654     01/26/24 2330  ceFEPIme  (MAXIPIME ) 2 g in sodium chloride  0.9 % 100 mL IVPB        2 g 200 mL/hr over 30 Minutes Intravenous  Once 01/26/24 2316 01/27/24 0043   01/26/24 2330  metroNIDAZOLE  (FLAGYL ) IVPB 500 mg        500 mg 100 mL/hr over 60 Minutes Intravenous  Once 01/26/24 2316 01/27/24 0043       Assessment/Plan: Patient with acute cholecystitis concern for the CT and US . With his symptoms I think he does have it and has some inflammatory changes of the duodenum/ colon on the CT. He has a history of portal HTN and cirrhosis, and he needs tertiary care/academic center to help manage his cholecystitis.  I reached out to Stephen Burch last night that could not give an definitive answer until this AM and they are unable to accept him as they are full per the transfer center.    I will reach out to [facility] to see if he can be transferred.  HIDA will be done to definitively confirm the cholecystitis   Fax the demographics to [phone] Will have US , CT, MRI pushed over now to [facility]  Will have HIDA pushed over once it is completed   Continue antibiotics PRN for pain NPO    LOS: 0 days    Stephen Burch 01/27/2024

## 2024-01-27 NOTE — ED Provider Notes (Signed)
 Per Dr. Kallie, patient has been declined at [facility] She will order HIDA scan to evaluate for cholecystitis She also will  to transfer to [facility] Signed out to oncoming ED provider   Midge Golas, MD 01/27/24 [phone]

## 2024-01-27 NOTE — ED Provider Notes (Signed)
 Patient has been seen by general surgery.  Dr. Kallie has attempted to arrange transfer to [facility] due to his complicated medical history in the setting of cholecystitis Currently [facility] is looking at this case & will determine if he is acceptable for transfer.  Surgery is already told them that he is a nonoperative candidate Patient currently stable, vitals appropriate, he has received IV antibiotics   Midge Golas, MD 01/27/24 [phone]

## 2024-01-27 NOTE — ED Notes (Signed)
 [place] at bedside

## 2024-01-27 NOTE — ED Notes (Signed)
Pt ambulated to the bathroom unassisted.  

## 2024-01-27 NOTE — ED Provider Notes (Signed)
 Clinical Course as of 01/27/24 1553  Mon Jan 27, 2024  0723 Handoff DW Cirrhosis, portal htn Cholecystis  Dr Kallie eval pt, HIDA scan pending, abx cont [SG]  1136 U/s equivocal Still plan for HIDA Dr Kallie working on possible txfr [SG]  1332 Unable to txfr pt to [facility], spoke w/ dr [facility], plan at this time to admit the pt here under hospitalist with abx and ?IR perc tube if fails. O/w needs to f/u o/p at [facility] [SG]    Clinical Course User Index [SG] Elnor Jayson LABOR, DO    Admit Dr Pearlean Elnor, Jayson LABOR, DO 01/27/24 [phone]

## 2024-01-27 NOTE — Assessment & Plan Note (Signed)
 Not on anticoagulation or rate limiting meds.  Chronic thrombocytopenia.

## 2024-01-27 NOTE — Assessment & Plan Note (Addendum)
 Appears stable and compensated.  Chronic thrombocytopenia- 64, over the past 2 years platelets have ranged from 65-99. - Holding Lasix  80mg  daily and spironolactone  for now -Gentle hydration for now

## 2024-01-27 NOTE — Consult Note (Addendum)
 [facility] Consult  Reason for Consult: Acute cholecystitis  Referring Physician: Dr. Rana Dr. Midge   Chief Complaint   Abdominal Pain     HPI: Stephen Burch is a 68 y.o. male with known NASH cirrhosis, Portal hypertension with esophageal, gastric and rectal varices who has refused evaluation for TIPS in the past and is managed by GI with diuretics at this time. He has known gallstones and was referred as an outpatient to me for gallstones and rare RUQ pain in May. I saw him then and recommended he get referral to tertiary care for cholecystectomy evaluation given his history.   He comes in with severe RUQ Pain and no reported nausea/vomiting. CT scan which is the only imaging available at this hour shows concern for cholecystitis with edema and some associated colitis/ duodenitis.   He says his pain is pretty severe and was only improved slightly with pain medication. He was given antibiotics in the ED and I was called.   Past Medical History:  Diagnosis Date   Acute systolic (congestive) heart failure (HCC) 04/30/2018   Arthritis    knees, wrists   Barrett's esophagus    Cardiac arrest (HCC) 02/22/2016   Complication of anesthesia    02-22-2016 intraop cardiac arrest immediately after vancomyocin administration , pt cardiac arrest w/ Vfib,  please refer to anesthesia record in epic   Dysrhythmia    Afib 12/2018 - converted to NSR   Esophageal varices determined by endoscopy (HCC) 11/2016   grade 1   GERD (gastroesophageal reflux disease)    Hiatal hernia    History of adenomatous polyp of colon    History of asthma    as child   History of DVT (deep vein thrombosis)    right lower extremitty post-op right total knee surgery 09/ 2010,  treated w/ coumadin   History of esophageal dilatation 07/1998   for schatzski ring   History of kidney stones    History of staph infection 04/2016   MSSA infection of right total knee w/ sepsis   Hx of cardiac  arrest    02-22-2016  intraoperative, immediately following moving pt into prone position and vancomyocin administration, cardiac arrest w/ Vfib (referred to anesthesia record in epic) pt extubated himself next day   Infection of prosthetic right knee joint (HCC)    Liver cirrhosis secondary to NASH (nonalcoholic steatohepatitis) (HCC)    NAFLD-- followed by dr golda---  liver bx 09-30-2012  mild portal and focal sinusoidal fibrosis   Pre-diabetes    Pyogenic arthritis of right knee joint (HCC) 04/02/2016   Scapholunate advanced collapse of left wrist    deformity   Shock circulatory (HCC)    Staphylococcus aureus bacteremia with sepsis (HCC)    Thrombocytopenia (HCC)    10-09-2017 per pt his platelets have always been low , followed by pcp, never been referred to hematologist    Past Surgical History:  Procedure Laterality Date   APPLICATION OF WOUND VAC Right 02/22/2016   Procedure: APPLICATION OF WOUND VAC;  Surgeon: Marcey Raman, MD;  Location: [facility];  Service: Orthopedics;  Laterality: Right;   BIOPSY  02/10/2020   Procedure: BIOPSY;  Surgeon: golda Claudis PENNER, MD;  [facility];  Service: Endoscopy;;   CARDIAC CATHETERIZATION N/A 02/24/2016   Procedure: Left Heart Cath and Coronary Angiography;  Surgeon: Lonni JONETTA Cash, MD;  Location: [facility] INVASIVE CV LAB;  Service: Cardiovascular;  Laterality: N/A;  No angiographic evidence of CAD,  normal  LVSF, ef 50-55%   CARPECTOMY Left 10/17/2017   Procedure: LEFT WRIST PROXIMAL ROW CARPECTOMY WITH POSTEROR IMBROSSEOUS NERVE EXCISION;  Surgeon: Sissy Cough, MD;  Location: [facility];  Service: Orthopedics;  Laterality: Left;  AXILLARY BLOCK   CARPECTOMY WITH RADIAL STYLOIDECTOMY Left 02/11/2019   Procedure: LEFT WRIST RADIAL STYLOIDECTOMY;  Surgeon: Sissy Cough, MD;  Location[facility] OR;  Service: Orthopedics;  Laterality: Left;   COLONOSCOPY     COLONOSCOPY N/A 10/22/2023   Procedure: COLONOSCOPY;  Surgeon:  Eartha Angelia Sieving, MD;  [facility];  Service: Gastroenterology;  Laterality: N/A;  9:45AM;ASA 3   COLONOSCOPY WITH PROPOFOL  N/A 02/10/2020   Procedure: COLONOSCOPY WITH PROPOFOL ;  Surgeon: Golda Claudis PENNER, MD;  [facility];  Service: Endoscopy;  Laterality: N/A;  955   EAR CYST EXCISION Right 09/06/2014   Procedure: OPEN EXCISION BAKER'S CYST RIGHT KNEE;  Surgeon: Marcey Raman, MD;  [facility]: [facility];  Service: Orthopedics;  Laterality: Right;   ESOPHAGEAL BANDING  06/30/2020   Procedure: ESOPHAGEAL BANDING;  Surgeon: Golda Claudis PENNER, MD;  [facility];  Service: Endoscopy;;   ESOPHAGEAL DILATION  1996/ 2000   ESOPHAGOGASTRODUODENOSCOPY N/A 09/17/2012   Procedure: ESOPHAGOGASTRODUODENOSCOPY (EGD);  Surgeon: Claudis PENNER Golda, MD;  [facility];  Service: Endoscopy;  Laterality: N/A;  200   ESOPHAGOGASTRODUODENOSCOPY N/A 12/15/2015   Procedure: ESOPHAGOGASTRODUODENOSCOPY (EGD);  Surgeon: Claudis PENNER Golda, MD;  [facility];  Service: Endoscopy;  Laterality: N/A;  1245   ESOPHAGOGASTRODUODENOSCOPY N/A 12/17/2016   Procedure: ESOPHAGOGASTRODUODENOSCOPY (EGD);  Surgeon: Golda Claudis PENNER, MD;  [facility];  Service: Endoscopy;  Laterality: N/A;  210   ESOPHAGOGASTRODUODENOSCOPY N/A 06/30/2020   Procedure: ESOPHAGOGASTRODUODENOSCOPY (EGD);  Surgeon: Golda Claudis PENNER, MD;  [facility];  Service: Endoscopy;  Laterality: N/A;  11:15   ESOPHAGOGASTRODUODENOSCOPY (EGD) WITH PROPOFOL  N/A 02/10/2020   Procedure: ESOPHAGOGASTRODUODENOSCOPY (EGD) WITH PROPOFOL ;  Surgeon: Golda Claudis PENNER, MD;  [facility];  Service: Endoscopy;  Laterality: N/A;   ESOPHAGOGASTRODUODENOSCOPY (EGD) WITH PROPOFOL  N/A 11/04/2020   Procedure: ESOPHAGOGASTRODUODENOSCOPY (EGD) WITH PROPOFOL ;  Surgeon: Eartha Angelia Sieving, MD;  [facility];  Service: Gastroenterology;  Laterality: N/A;  9:45   I & D KNEE WITH POLY EXCHANGE Right  04/02/2016   Procedure: IRRIGATION AND DEBRIDEMENT RIGHT KNEE WITH POLY EXCHANGE;  Surgeon: Marcey Raman, MD;  [facility]: [facility];  Service: Orthopedics;  Laterality: Right;   INCISION AND DRAINAGE HEMATOMA POST LEFT  TOTAL KNEE  2012   INGUINAL HERNIA REPAIR Bilateral 05/22/2013   Procedure: REPAIR OF RECURRENT INCARCERATED INGUINAL HERNIA WITH MESH RIGHT SIDE,  REPAIR OF RECURRENT INGUINAL HERNIA WITH MESH LEFT SIDE;  Surgeon: Elon CHRISTELLA Pacini, MD;  Location: [facility];  Service: General;  Laterality: Bilateral;   INGUINAL HERNIA REPAIR Bilateral 11/1997   INSERTION OF MESH Bilateral 05/22/2013   Procedure: INSERTION OF MESH;  Surgeon: Elon CHRISTELLA Pacini, MD;  Location: [facility];  Service: General;  Laterality: Bilateral;   IRRIGATION AND DEBRIDEMENT KNEE Right 02/22/2016   Procedure: IRRIGATION AND DEBRIDEMENT KNEE;  Surgeon: Marcey Raman, MD;  [facility]: [facility];  Service: Orthopedics;  Laterality: Right;   Irrigation and Debridement right knee  03/23/2009   Dr. Cleotilde, [facility]   KNEE ARTHROSCOPY W/ SYNOVECTOMY Right 01/30/2010   AND DEBRIDEMENT OF HETEROTOPIC   LIVER BIOPSY  09/30/2012   REVISION TOTAL KNEE ARTHROPLASTY Left fall 2012   TONSILLECTOMY  child   TOTAL KNEE ARTHROPLASTY Bilateral  right 09/ 2010/  left 09-18-2010   TRANSTHORACIC ECHOCARDIOGRAM  02/22/2016   ef 30-35% (per cardiac cath 02-24-2016 normal), severe hypokinesis of the mid-apicalanteroseptal and apical myocardium,  grade 1 diastolic dysfunction/ trivial PR and TR   TRIGGER FINGER RELEASE Left 02/11/2019   Procedure: LEFT WRIST STENOSING TENOSYNOVITIS RELEASE;  Surgeon: Sissy Cough, MD;  Location: [facility] OR;  Service: Orthopedics;  Laterality: Left;  MAC WITH AXILLARY BLOCK   WRIST ARTHROPLASTY Left 10/17/2017   Procedure: CAPITATE RESURFACING ARTHROPLASTY;  Surgeon: Sissy Cough, MD;  Location: [facility];  Service: Orthopedics;  Laterality: Left;    Family History  Problem Relation Age of Onset   Arthritis  Father        died age 9   GI Bleed Father        upper GI Bleed, non-ETOH cirrhosis   Arthritis Brother    Diabetes Brother        type 2   Diabetes Paternal Uncle        type 2   Heart attack Maternal Grandmother    Heart attack Maternal Grandfather    Colon cancer Neg Hx    Colon polyps Neg Hx     Social History   Tobacco Use   Smoking status: Former    Current packs/day: 0.00    Average packs/day: 0.3 packs/day for 3.0 years (0.8 ttl pk-yrs)    Types: Cigarettes    Start date: 10/10/1971    Quit date: 10/10/1974    Years since quitting: 49.3    Passive exposure: Past   Smokeless tobacco: Never  Vaping Use   Vaping status: Never Used  Substance Use Topics   Alcohol use: Not Currently   Drug use: No    Medications: I have reviewed the patient's current medications. Current Facility-Administered Medications  Medication Dose Route Frequency Provider Last Rate Last Admin   metroNIDAZOLE  (FLAGYL ) IVPB 500 mg  500 mg Intravenous Once Kammerer, Megan L, DO 100 mL/hr at 01/26/24 2341 500 mg at 01/26/24 2341   Current Outpatient Medications  Medication Sig Dispense Refill Last Dose/Taking   cetirizine  (ZYRTEC ) 10 MG tablet Take 10 mg by mouth daily.      EPINEPHrine  (PRIMATENE  MIST) 0.125 MG/ACT AERO Inhale 1 puff into the lungs daily as needed (asthma).      ferrous sulfate  (FEROSUL) 325 (65 FE) MG tablet TAKE 1 TABLET BY MOUTH DAILY WITH BREAKFAST 60 tablet 5    furosemide  (LASIX ) 40 MG tablet Take 2 tablets by mouth once daily 180 tablet 0    glipiZIDE  (GLUCOTROL  XL) 10 MG 24 hr tablet Take 10 mg by mouth daily with breakfast.      sildenafil (VIAGRA) 100 MG tablet Take 100 mg by mouth daily as needed for erectile dysfunction.      spironolactone  (ALDACTONE ) 100 MG tablet Take 2 tablets by mouth once daily 180 tablet 0    triamcinolone  cream (KENALOG ) 0.5 % Apply topically.       Allergies  Allergen Reactions   Asa [Aspirin] Shortness Of Breath    asthma symptoms    Penicillins Shortness Of Breath    Has patient had a PCN reaction causing immediate rash, facial/tongue/throat swelling, SOB or lightheadedness with hypotension: Yes Has patient had a PCN reaction causing severe rash involving mucus membranes or skin necrosis: No Has patient had a PCN reaction that required hospitalization No Has patient had a PCN reaction occurring within the last 10 years: No If all of the above answers are  NO, then may proceed with Cephalosporin use. asthma symptoms Patient has tolerated cefazolin , ceftriaxo   Vancomycin  Anaphylaxis    Immediately following being turned into prone position and Vancomycin  administration in the OR patient cardiac arrest w/  Vfib.     ROS:  A comprehensive review of systems was negative except for: Gastrointestinal: positive for abdominal pain  Blood pressure (!) 141/83, pulse 71, temperature 98.1 F (36.7 C), resp. rate 18, height 5' 10 (1.778 m), weight 91 kg, SpO2 98%. Physical Exam Vitals reviewed.  HENT:     Head: Normocephalic.  Cardiovascular:     Rate and Rhythm: Normal rate.  Pulmonary:     Effort: Pulmonary effort is normal.  Abdominal:     Palpations: Abdomen is soft.     Tenderness: There is abdominal tenderness in the right upper quadrant and epigastric area. There is no guarding.  Skin:    General: Skin is warm.  Neurological:     General: No focal deficit present.     Mental Status: He is alert.  Psychiatric:        Mood and Affect: Mood normal.     Results: Results for orders placed or performed during the hospital encounter of 01/26/24 (from the past 48 hours)  Urinalysis, Routine w reflex microscopic -Urine, Clean Catch     Status: None   Collection Time: 01/26/24  9:15 PM  Result Value Ref Range   Color, Urine YELLOW YELLOW   APPearance CLEAR CLEAR   Specific Gravity, Urine 1.012 1.005 - 1.030   pH 5.0 5.0 - 8.0   Glucose, UA NEGATIVE NEGATIVE mg/dL   Hgb urine dipstick NEGATIVE NEGATIVE    Bilirubin Urine NEGATIVE NEGATIVE   Ketones, ur NEGATIVE NEGATIVE mg/dL   Protein, ur NEGATIVE NEGATIVE mg/dL   Nitrite NEGATIVE NEGATIVE   Leukocytes,Ua NEGATIVE NEGATIVE    Comment: Performed at [facility], [address] [place], [place] [address]  Lipase, blood     Status: None   Collection Time: 01/26/24  9:39 PM  Result Value Ref Range   Lipase 48 11 - 51 U/L    Comment: Performed at [facility], [address], [place], [place] [address]  Comprehensive metabolic panel     Status: Abnormal   Collection Time: 01/26/24  9:39 PM  Result Value Ref Range   Sodium 139 135 - 145 mmol/L   Potassium 3.7 3.5 - 5.1 mmol/L   Chloride 102 98 - 111 mmol/L   CO2 24 22 - 32 mmol/L   Glucose, Bld 192 (H) 70 - 99 mg/dL    Comment: Glucose reference range applies only to samples taken after fasting for at least 8 hours.   BUN 15 8 - 23 mg/dL   Creatinine, Ser 9.14 0.61 - 1.24 mg/dL   Calcium 8.5 (L) 8.9 - 10.3 mg/dL   Total Protein 6.8 6.5 - 8.1 g/dL   Albumin 3.6 3.5 - 5.0 g/dL   AST 40 15 - 41 U/L   ALT 40 0 - 44 U/L   Alkaline Phosphatase 191 (H) 38 - 126 U/L   Total Bilirubin 3.9 (H) 0.0 - 1.2 mg/dL   GFR, Estimated >39 >39 mL/min    Comment: (NOTE) Calculated using the CKD-EPI Creatinine Equation (2021)    Anion gap 13 5 - 15    Comment: Performed at [facility], [address], [place], [place] [address]  CBC     Status: Abnormal   Collection Time: 01/26/24  9:39 PM  Result Value  Ref Range   WBC 3.3 (L) 4.0 - 10.5 K/uL   RBC 4.10 (L) 4.22 - 5.81 MIL/uL   Hemoglobin 13.0 13.0 - 17.0 g/dL   HCT 61.6 (L) 60.9 - 47.9 %   MCV 93.4 80.0 - 100.0 fL   MCH 31.7 26.0 - 34.0 pg   MCHC 33.9 30.0 - 36.0 g/dL   RDW 86.7 88.4 - 84.4 %   Platelets 71 (L) 150 - 400 K/uL    Comment: SPECIMEN CHECKED FOR CLOTS Immature Platelet Fraction may be clinically indicated, consider ordering this additional test [id number] PLATELET COUNT CONFIRMED BY SMEAR    nRBC 0.0 0.0 - 0.2 %    Comment:  Performed at [facility], [address], [place], [place] [address]   Personally reviewed and showed the patient/family- distended gallbladder, known stones, more edema around the gallbladder and some thickening of the colon in the vicinity  CT ABDOMEN PELVIS W CONTRAST Result Date: 01/26/2024 CLINICAL DATA:  Right upper quadrant EXAM: CT ABDOMEN AND PELVIS WITH CONTRAST TECHNIQUE: Multidetector CT imaging of the abdomen and pelvis was performed using the standard protocol following bolus administration of intravenous contrast. RADIATION DOSE REDUCTION: This exam was performed according to the departmental dose-optimization program which includes automated exposure control, adjustment of the mA and/or kV according to patient size and/or use of iterative reconstruction technique. CONTRAST:  OMNIPAQUE  IOHEXOL  300 MG/ML  SOLN COMPARISON:  MRI of the abdomen 12/16/2023. FINDINGS: Lower chest: No acute abnormality. Hepatobiliary: Nodular liver contour is again seen compatible with cirrhosis. Gallstones are present. The gallbladder is distended there is mild inflammatory stranding surrounding the gallbladder. No biliary ductal dilatation identified. Pancreas: Unremarkable. No pancreatic ductal dilatation or surrounding inflammatory changes. Spleen: The spleen is enlarged, unchanged. Adrenals/Urinary Tract: Rounded hypodensities in the kidneys are too small to characterize, likely cyst. The adrenal glands, kidneys and bladder are otherwise within normal limits. Stomach/Bowel: There is diffuse colonic wall thickening most significant in the ascending colon. There is also diffuse wall thickening and inflammation of the entire duodenum. Appendix is not definitely seen. There is no bowel obstruction, pneumatosis or free air. There is wall thickening of the distal esophagus. Vascular/Lymphatic: Aorta and IVC are normal in size. No enlarged lymph nodes are seen. Left-sided varicocele and splenic varices as well as  gastric varices are again noted. Reproductive: Prostate gland is mildly enlarged. Other: There is small volume ascites. Musculoskeletal: Degenerative changes affect the spine. IMPRESSION: 1. Cholelithiasis with distended gallbladder and mild inflammatory stranding surrounding the gallbladder. Findings are concerning for acute cholecystitis. 2. Diffuse colonic wall thickening most significant in the ascending colon worrisome for colitis. 3. Diffuse wall thickening and inflammation of the duodenum compatible with duodenitis. 4. Cirrhosis with sequela of portal hypertension including splenomegaly, varices, and small volume ascites. 5. Wall thickening of the distal esophagus worrisome for esophagitis. Electronically Signed   By: Greig Pique M.D.   On: 01/26/2024 23:08     Assessment & Plan:  Stephen Burch is a 68 y.o. male with cirrhosis, portal hypertension with concern for acute cholecystitis on CT scan. He is very tender in the RUQ and reports severe acute onset of pain.  He has received antibiotics in the ED. I have discussed the case with Dr. Dasie at [facility] and she reports that due to this Portal HTN in association with the cirrhosis that he is not appropriate candidate for management at their facility either. His MELD scores have been 7-17 in the past year.  His VOCAL  Penn calculation today consider that the cholecystectomy is not emergent would put him at a 20 % 90 day decompensation.   Predicted Postoperative Outcomes by the VOCAL-Penn Score:     30-day mortality: 2.8%     90-day mortality: 4.6%     180-day mortality: 7.1%     90-day decompensation: 20.3%  Discussed with him getting him transferred to get him set up with specialist who are better equipped at managing.  I have called the [facility] Transfer line and am waiting on them to call me back.  [phone] fax demographic sheet   Sending CT from today and MRI over through PACS system  All questions were answered to the satisfaction of the  patient and family.   Manuelita JAYSON Pander 01/27/2024, 12:10 AM

## 2024-01-27 NOTE — Progress Notes (Signed)
 [facility]  [facility] has touch base with [facility] surgery who does not feel like Patient is a surgical candidate at the moment and would want to hida a scan.  They said Patient could come to [facility] through the Medicine team and at this time this will not be settled until after 6:30 AM due to their volume and acuity.  We'll wait to see what [facility] says before. Doing any further work up here at [facility]. We do not have Hida ar night regardless.  Prn for pain Npo with ice/ sips IVF Antibiotics  Manuelita Pander, MD

## 2024-01-27 NOTE — Assessment & Plan Note (Addendum)
 Controlled.  A1c 5.5- 10/2023 - Hold glipizide  - SSI- S q6h

## 2024-01-27 NOTE — H&P (Addendum)
 History and Physical    Stephen Burch FMW:[id number] DOB: August 01, 1955 DOA: 01/26/2024  PCP: Shona Norleen PEDLAR, MD   Patient coming from: Home  I have personally briefly reviewed patient's old medical records in [facility]  Chief Complaint: Right upper abdominal pain  HPI: Stephen Burch is a 68 y.o. male with medical history significant for systolic CHF, liver cirrhosis, cardiac arrest 2017, esophageal varices, atrial fibrillation, hypothyroidism, diabetes mellitus. Patient presented to the ED yesterday with complaints of right upper quadrant pain that started yesterday afternoon.  No vomiting.  No fevers no chills. No diarrhea.  ED Course: Tmax 98.4.  Heart rate 69-90.  Respiratory 12-22.  Blood pressure systolic 94-148.  O2 sats 91 to 98% on room air. Platelets 66.  Elevation in liver enzymes, ALP 179, AST 42, T. bili 4.8.  Normal ALP 179. CT scan yesterday 7/27 with findings concerning for acute cholecystitis RUQ US -findings suspicious for acute cholecystitis including multiple stones in the gallbladder neck and cystic duct on recent CT. (See detailed report).  Started on IV metronidazole  and cefepime .  500 mL bolus given last night.  General surgeon Dr. Kallie was consulted-initial plan was to transfer patient to tertiary care center due to patient's comorbidities of liver cirrhosis and portal hypertension.  Unfortunately [facility] is at capacity, and [facility] -would do essentially what can do here.  Hence hospitalization requested here for antibiotics, and likely IR cholecystectomy tube placement.  Review of Systems: As per HPI all other systems reviewed and negative.  Past Medical History:  Diagnosis Date   Acute systolic (congestive) heart failure (HCC) 04/30/2018   Arthritis    knees, wrists   Barrett's esophagus    Cardiac arrest (HCC) 02/22/2016   Complication of anesthesia    02-22-2016 intraop cardiac arrest immediately after vancomyocin administration , pt cardiac arrest  w/ Vfib,  please refer to anesthesia record in epic   Dysrhythmia    Afib 12/2018 - converted to NSR   Esophageal varices determined by endoscopy (HCC) 11/2016   grade 1   GERD (gastroesophageal reflux disease)    Hiatal hernia    History of adenomatous polyp of colon    History of asthma    as child   History of DVT (deep vein thrombosis)    right lower extremitty post-op right total knee surgery 09/ 2010,  treated w/ coumadin   History of esophageal dilatation 07/1998   for schatzski ring   History of kidney stones    History of staph infection 04/2016   MSSA infection of right total knee w/ sepsis   Hx of cardiac arrest    02-22-2016  intraoperative, immediately following moving pt into prone position and vancomyocin administration, cardiac arrest w/ Vfib (referred to anesthesia record in epic) pt extubated himself next day   Infection of prosthetic right knee joint (HCC)    Liver cirrhosis secondary to NASH (nonalcoholic steatohepatitis) (HCC)    NAFLD-- followed by dr golda---  liver bx 09-30-2012  mild portal and focal sinusoidal fibrosis   Pre-diabetes    Pyogenic arthritis of right knee joint (HCC) 04/02/2016   Scapholunate advanced collapse of left wrist    deformity   Shock circulatory (HCC)    Staphylococcus aureus bacteremia with sepsis (HCC)    Thrombocytopenia (HCC)    10-09-2017 per pt his platelets have always been low , followed by pcp, never been referred to hematologist    Past Surgical History:  Procedure Laterality Date   APPLICATION OF  WOUND VAC Right 02/22/2016   Procedure: APPLICATION OF WOUND VAC;  Surgeon: Marcey Raman, MD;  [facility];  Service: Orthopedics;  Laterality: Right;   BIOPSY  02/10/2020   Procedure: BIOPSY;  Surgeon: Golda Claudis PENNER, MD;  [facility];  Service: Endoscopy;;   CARDIAC CATHETERIZATION N/A 02/24/2016   Procedure: Left Heart Cath and Coronary Angiography;  Surgeon: Lonni JONETTA Cash, MD;  Location: [facility]  INVASIVE CV LAB;  Service: Cardiovascular;  Laterality: N/A;  No angiographic evidence of CAD,  normal LVSF, ef 50-55%   CARPECTOMY Left 10/17/2017   Procedure: LEFT WRIST PROXIMAL ROW CARPECTOMY WITH POSTEROR IMBROSSEOUS NERVE EXCISION;  Surgeon: Sissy Cough, MD;  Location: [facility] [place];  Service: Orthopedics;  Laterality: Left;  AXILLARY BLOCK   CARPECTOMY WITH RADIAL STYLOIDECTOMY Left 02/11/2019   Procedure: LEFT WRIST RADIAL STYLOIDECTOMY;  Surgeon: Sissy Cough, MD;  Location: [facility] OR;  Service: Orthopedics;  Laterality: Left;   COLONOSCOPY     COLONOSCOPY N/A 10/22/2023   Procedure: COLONOSCOPY;  Surgeon: Eartha Angelia Sieving, MD;  [facility];  Service: Gastroenterology;  Laterality: N/A;  9:45AM;ASA 3   COLONOSCOPY WITH PROPOFOL  N/A 02/10/2020   Procedure: COLONOSCOPY WITH PROPOFOL ;  Surgeon: Golda Claudis PENNER, MD;  [facility];  Service: Endoscopy;  Laterality: N/A;  955   EAR CYST EXCISION Right 09/06/2014   Procedure: OPEN EXCISION BAKER'S CYST RIGHT KNEE;  Surgeon: Marcey Raman, MD;  [facility]: [facility];  Service: Orthopedics;  Laterality: Right;   ESOPHAGEAL BANDING  06/30/2020   Procedure: ESOPHAGEAL BANDING;  Surgeon: Golda Claudis PENNER, MD;  [facility];  Service: Endoscopy;;   ESOPHAGEAL DILATION  1996/ 2000   ESOPHAGOGASTRODUODENOSCOPY N/A 09/17/2012   Procedure: ESOPHAGOGASTRODUODENOSCOPY (EGD);  Surgeon: Claudis PENNER Golda, MD;  [facility];  Service: Endoscopy;  Laterality: N/A;  200   ESOPHAGOGASTRODUODENOSCOPY N/A 12/15/2015   Procedure: ESOPHAGOGASTRODUODENOSCOPY (EGD);  Surgeon: Claudis PENNER Golda, MD;  [facility];  Service: Endoscopy;  Laterality: N/A;  1245   ESOPHAGOGASTRODUODENOSCOPY N/A 12/17/2016   Procedure: ESOPHAGOGASTRODUODENOSCOPY (EGD);  Surgeon: Golda Claudis PENNER, MD;  [facility];  Service: Endoscopy;  Laterality: N/A;  210   ESOPHAGOGASTRODUODENOSCOPY N/A 06/30/2020   Procedure:  ESOPHAGOGASTRODUODENOSCOPY (EGD);  Surgeon: Golda Claudis PENNER, MD;  [facility];  Service: Endoscopy;  Laterality: N/A;  11:15   ESOPHAGOGASTRODUODENOSCOPY (EGD) WITH PROPOFOL  N/A 02/10/2020   Procedure: ESOPHAGOGASTRODUODENOSCOPY (EGD) WITH PROPOFOL ;  Surgeon: Golda Claudis PENNER, MD;  [facility];  Service: Endoscopy;  Laterality: N/A;   ESOPHAGOGASTRODUODENOSCOPY (EGD) WITH PROPOFOL  N/A 11/04/2020   Procedure: ESOPHAGOGASTRODUODENOSCOPY (EGD) WITH PROPOFOL ;  Surgeon: Eartha Angelia Sieving, MD;  [facility];  Service: Gastroenterology;  Laterality: N/A;  9:45   I & D KNEE WITH POLY EXCHANGE Right 04/02/2016   Procedure: IRRIGATION AND DEBRIDEMENT RIGHT KNEE WITH POLY EXCHANGE;  Surgeon: Marcey Raman, MD;  [facility]: [facility];  Service: Orthopedics;  Laterality: Right;   INCISION AND DRAINAGE HEMATOMA POST LEFT  TOTAL KNEE  2012   INGUINAL HERNIA REPAIR Bilateral 05/22/2013   Procedure: REPAIR OF RECURRENT INCARCERATED INGUINAL HERNIA WITH MESH RIGHT SIDE,  REPAIR OF RECURRENT INGUINAL HERNIA WITH MESH LEFT SIDE;  Surgeon: Elon CHRISTELLA Pacini, MD;  Location: [facility];  Service: General;  Laterality: Bilateral;   INGUINAL HERNIA REPAIR Bilateral 11/1997   INSERTION OF MESH Bilateral 05/22/2013   Procedure: INSERTION OF MESH;  Surgeon: Elon CHRISTELLA Pacini, MD;  Location: [facility];  Service: General;  Laterality: Bilateral;   IRRIGATION AND DEBRIDEMENT KNEE Right 02/22/2016   Procedure: IRRIGATION AND DEBRIDEMENT KNEE;  Surgeon: Marcey Raman, MD;  Location: [facility];  Service: Orthopedics;  Laterality: Right;   Irrigation and Debridement right knee  03/23/2009   Dr. Cleotilde, [facility]   KNEE ARTHROSCOPY W/ SYNOVECTOMY Right 01/30/2010   AND DEBRIDEMENT OF HETEROTOPIC   LIVER BIOPSY  09/30/2012   REVISION TOTAL KNEE ARTHROPLASTY Left fall 2012   TONSILLECTOMY  child   TOTAL KNEE ARTHROPLASTY Bilateral right 09/ 2010/  left 09-18-2010   TRANSTHORACIC ECHOCARDIOGRAM  02/22/2016   ef 30-35%  (per cardiac cath 02-24-2016 normal), severe hypokinesis of the mid-apicalanteroseptal and apical myocardium,  grade 1 diastolic dysfunction/ trivial PR and TR   TRIGGER FINGER RELEASE Left 02/11/2019   Procedure: LEFT WRIST STENOSING TENOSYNOVITIS RELEASE;  Surgeon: Sissy Cough, MD;  Location: [facility] OR;  Service: Orthopedics;  Laterality: Left;  MAC WITH AXILLARY BLOCK   WRIST ARTHROPLASTY Left 10/17/2017   Procedure: CAPITATE RESURFACING ARTHROPLASTY;  Surgeon: Sissy Cough, MD;  Location: [facility];  Service: Orthopedics;  Laterality: Left;     reports that he quit smoking about 49 years ago. His smoking use included cigarettes. He started smoking about 52 years ago. He has a 0.8 pack-year smoking history. He has been exposed to tobacco smoke. He has never used smokeless tobacco. He reports that he does not currently use alcohol. He reports that he does not use drugs.  Allergies  Allergen Reactions   Asa [Aspirin] Shortness Of Breath    asthma symptoms   Penicillins Shortness Of Breath    Has patient had a PCN reaction causing immediate rash, facial/tongue/throat swelling, SOB or lightheadedness with hypotension: Yes Has patient had a PCN reaction causing severe rash involving mucus membranes or skin necrosis: No Has patient had a PCN reaction that required hospitalization No Has patient had a PCN reaction occurring within the last 10 years: No If all of the above answers are NO, then may proceed with Cephalosporin use. asthma symptoms Patient has tolerated cefazolin , ceftriaxo   Vancomycin  Anaphylaxis    Immediately following being turned into prone position and Vancomycin  administration in the OR patient cardiac arrest w/  Vfib.    Family History  Problem Relation Age of Onset   Arthritis Father        died age 31   GI Bleed Father        upper GI Bleed, non-ETOH cirrhosis   Arthritis Brother    Diabetes Brother        type 2   Diabetes Paternal  Uncle        type 2   Heart attack Maternal Grandmother    Heart attack Maternal Grandfather    Colon cancer Neg Hx    Colon polyps Neg Hx     Prior to Admission medications   Medication Sig Start Date End Date Taking? Authorizing Provider  EPINEPHrine  (PRIMATENE  MIST) 0.125 MG/ACT AERO Inhale 1 puff into the lungs daily as needed (asthma).   Yes [provider]  ferrous sulfate  (FEROSUL) 325 (65 FE) MG tablet TAKE 1 TABLET BY MOUTH DAILY WITH BREAKFAST 01/14/24  Yes Carlan, [name] L, NP  furosemide  (LASIX ) 40 MG tablet Take 2 tablets by mouth once daily 11/21/23  Yes Carlan, [name] L, NP  glipiZIDE  (GLUCOTROL  XL) 10 MG 24 hr tablet Take 10 mg by mouth daily with breakfast.   Yes [provider]  sildenafil (VIAGRA) 100 MG tablet  Take 100 mg by mouth daily as needed for erectile dysfunction.   Yes [provider]  spironolactone  (ALDACTONE ) 100 MG tablet Take 2 tablets by mouth once daily 11/20/23  Yes Carlan, Mitzie CROME, NP    Physical Exam: Vitals:   01/27/24 1345 01/27/24 1509 01/27/24 1512 01/27/24 1530  BP: 117/65   128/72  Pulse: 82 83  90  Resp: 13 20  19   Temp:   99.5 F (37.5 C)   TempSrc:   Oral   SpO2: 94% 93%  93%  Weight:      Height:        Constitutional: NAD, calm, comfortable Vitals:   01/27/24 1345 01/27/24 1509 01/27/24 1512 01/27/24 1530  BP: 117/65   128/72  Pulse: 82 83  90  Resp: 13 20  19   Temp:   99.5 F (37.5 C)   TempSrc:   Oral   SpO2: 94% 93%  93%  Weight:      Height:       Eyes: PERRL, lids and conjunctivae normal ENMT: Mucous membranes are moist.  Neck: normal, supple, no masses, no thyromegaly Respiratory: clear to auscultation bilaterally, no wheezing, no crackles. Normal respiratory effort. No accessory muscle use.  Cardiovascular: Regular rate and rhythm, no murmurs / rubs / gallops. No extremity edema.  Extremities warm. Abdomen: Slightly bloated but soft, no tenderness, no masses palpated.  Musculoskeletal: no clubbing / cyanosis. No joint deformity upper and lower extremities.  Skin: no rashes, lesions, ulcers. No induration Neurologic: No facial asymmetry, moving extremities spontaneously, speech fluent. Psychiatric: Normal judgment and insight. Alert and oriented x 3. Normal mood.   Labs on Admission: I have personally reviewed following labs and imaging studies  CBC: Recent Labs  Lab 01/23/24 0934 01/26/24 2139 01/27/24 0723  WBC 2.7* 3.3* 4.8  NEUTROABS 2.0  --  4.1  HGB 13.2 13.0 13.1  HCT 38.0* 38.3* 37.9*  MCV 92.0 93.4 93.1  PLT 65* 71* 66*   Basic Metabolic Panel: Recent Labs  Lab 01/26/24 2139 01/27/24 0723  NA 139 136  K 3.7 3.9  CL 102 104  CO2 24 26  GLUCOSE 192* 165*  BUN 15 15  CREATININE 0.85 0.77  CALCIUM 8.5* 8.2*   GFR: Estimated Creatinine Clearance: 98.9 mL/min (by C-G formula based on SCr of 0.77 mg/dL). Liver Function Tests: Recent Labs  Lab 01/26/24 2139 01/27/24 0723  AST 40 42*  ALT 40 41  ALKPHOS 191* 179*  BILITOT 3.9* 4.8*  PROT 6.8 6.3*  ALBUMIN 3.6 3.2*   Recent Labs  Lab 01/26/24 2139  LIPASE 48   Urine analysis:    Component Value Date/Time   COLORURINE YELLOW 01/26/2024 2115   APPEARANCEUR CLEAR 01/26/2024 2115   LABSPEC 1.012 01/26/2024 2115   PHURINE 5.0 01/26/2024 2115   GLUCOSEU NEGATIVE 01/26/2024 2115   HGBUR NEGATIVE 01/26/2024 2115   BILIRUBINUR NEGATIVE 01/26/2024 2115   BILIRUBINUR negative 04/27/2019 1143   KETONESUR NEGATIVE 01/26/2024 2115   PROTEINUR NEGATIVE 01/26/2024 2115   UROBILINOGEN 0.2 04/27/2019 1143   UROBILINOGEN 0.2 05/13/2013 0930   NITRITE NEGATIVE 01/26/2024 2115   LEUKOCYTESUR NEGATIVE 01/26/2024 2115    Radiological Exams on Admission: NM Hepatobiliary Liver Func Result Date: 01/27/2024 CLINICAL DATA:  Right upper quadrant pain EXAM: NUCLEAR MEDICINE HEPATOBILIARY IMAGING TECHNIQUE: Sequential images of the abdomen were obtained out to 60 minutes following  intravenous administration of radiopharmaceutical. Additional imaging for 30 minutes was obtained after the administration of 2 mg of IV morphine   x1. RADIOPHARMACEUTICALS:  5.2 mCi Tc-36m  Choletec  IV COMPARISON:  Ultrasound 01/19/2024.  CT 01/26/2024. FINDINGS: Prompt uptake and biliary excretion of activity by the liver is seen. Biliary activity passes into small bowel, consistent with patent common bile duct. Initially gallbladder activity is not seen. After administration of morphine  the gallbladder is still non visible. IMPRESSION: No common duct obstruction. However even despite the administration of IV morphine , the gallbladder is not seen. This has a differential including acute cholecystitis. Please correlate with clinical presentation. Electronically Signed   By: Ranell Bring M.D.   On: 01/27/2024 14:45   US  Abdomen Limited RUQ (LIVER/GB) Result Date: 01/27/2024 CLINICAL DATA:  Right upper quadrant abdominal pain for proximally 12 hours. EXAM: ULTRASOUND ABDOMEN LIMITED RIGHT UPPER QUADRANT COMPARISON:  Abdomen and pelvis CT dated 01/26/2024 and 11/03/2023. Right upper quadrant abdomen ultrasound dated 11/03/2023. FINDINGS: Gallbladder: Dilated with indistinct wall thickening measuring up to 7 mm in thickness. No pericholecystic fluid. Sludge in the gallbladder. Multiple stones seen in the gallbladder neck and cystic duct on the CTs dated 01/26/2024 and 11/03/2023 are not visualized sonographically. This area is obscured by overlying bowel-gas. Positive sonographic Murphy sign. Common bile duct: Diameter: 4.1 mm Liver: Changes of cirrhosis with extensive nodular contours. Portal vein is poorly visualized on color Doppler imaging, patent on the CT obtained yesterday. The direction of flow was difficult to establish on today's exam. Other: None. IMPRESSION: 1. Findings suspicious for acute cholecystitis including multiple stones in the gallbladder neck and cystic duct on the recent CT, large enough to  cause cystic duct obstruction; positive sonographic Murphy sign, diffuse gallbladder wall thickening, gallbladder dilatation and sludge in the gallbladder. 2. Changes of cirrhosis of the liver. 3. Poorly visualized portal vein on color Doppler, making direction of flow difficult to determine. Electronically Signed   By: Elspeth Bathe M.D.   On: 01/27/2024 10:41   CT ABDOMEN PELVIS W CONTRAST Result Date: 01/26/2024 CLINICAL DATA:  Right upper quadrant EXAM: CT ABDOMEN AND PELVIS WITH CONTRAST TECHNIQUE: Multidetector CT imaging of the abdomen and pelvis was performed using the standard protocol following bolus administration of intravenous contrast. RADIATION DOSE REDUCTION: This exam was performed according to the departmental dose-optimization program which includes automated exposure control, adjustment of the mA and/or kV according to patient size and/or use of iterative reconstruction technique. CONTRAST:  OMNIPAQUE  IOHEXOL  300 MG/ML  SOLN COMPARISON:  MRI of the abdomen 12/16/2023. FINDINGS: Lower chest: No acute abnormality. Hepatobiliary: Nodular liver contour is again seen compatible with cirrhosis. Gallstones are present. The gallbladder is distended there is mild inflammatory stranding surrounding the gallbladder. No biliary ductal dilatation identified. Pancreas: Unremarkable. No pancreatic ductal dilatation or surrounding inflammatory changes. Spleen: The spleen is enlarged, unchanged. Adrenals/Urinary Tract: Rounded hypodensities in the kidneys are too small to characterize, likely cyst. The adrenal glands, kidneys and bladder are otherwise within normal limits. Stomach/Bowel: There is diffuse colonic wall thickening most significant in the ascending colon. There is also diffuse wall thickening and inflammation of the entire duodenum. Appendix is not definitely seen. There is no bowel obstruction, pneumatosis or free air. There is wall thickening of the distal esophagus. Vascular/Lymphatic:  Aorta and IVC are normal in size. No enlarged lymph nodes are seen. Left-sided varicocele and splenic varices as well as gastric varices are again noted. Reproductive: Prostate gland is mildly enlarged. Other: There is small volume ascites. Musculoskeletal: Degenerative changes affect the spine. IMPRESSION: 1. Cholelithiasis with distended gallbladder and mild inflammatory stranding surrounding the gallbladder. Findings  are concerning for acute cholecystitis. 2. Diffuse colonic wall thickening most significant in the ascending colon worrisome for colitis. 3. Diffuse wall thickening and inflammation of the duodenum compatible with duodenitis. 4. Cirrhosis with sequela of portal hypertension including splenomegaly, varices, and small volume ascites. 5. Wall thickening of the distal esophagus worrisome for esophagitis. Electronically Signed   By: Greig Pique M.D.   On: 01/26/2024 23:08   EKG: None   Assessment/Plan Principal Problem:   Acute cholecystitis Active Problems:   Portal hypertension with esophageal varices (HCC)   Liver cirrhosis secondary to NASH (HCC)   T2DM (type 2 diabetes mellitus) (HCC)   Thrombocytopenia (HCC)   History of atrial fibrillation   Esophageal varices without bleeding (HCC)   Assessment and Plan: * Acute cholecystitis CT abdomen and pelvis with contrast yesterday with findings concerning for acute cholecystitis, confirmed on RUQ Us .  HIDA scan also done. - General surgeon Dr. Kallie was consulted-initial plan was to transfer patient to tertiary care center due to patient's comorbidities of liver cirrhosis and portal hypertension.  Unfortunately [facility] is at capacity, and [facility] -would do essentially what can do here.  Hence hospitalization requested here for antibiotics, and likely IR cholecystectomy tube placement. - Per general surgery clear liquid diet -IV Dilaudid  0.5  PRN -IV cefepime  and metronidazole  -Zofran  as needed -Daily LFTs - 500 mL bolus given,  continue N/s 40 cc/hr   History of atrial fibrillation Not on anticoagulation or rate limiting meds.  Chronic thrombocytopenia.  T2DM (type 2 diabetes mellitus) (HCC) Controlled.  A1c 5.5- 10/2023 - Hold glipizide  - SSI- S q6h  Liver cirrhosis secondary to NASH (HCC) Appears stable and compensated.  Chronic thrombocytopenia- 66, over the past 2 years platelets have ranged from 65-99. - Holding Lasix  80mg  daily and spironolactone  for now -Gentle hydration for now   DVT prophylaxis: SCDS Code Status: FULL Code Family Communication: Ex-Wife and Mother at bedside Disposition Plan: > 2 days Consults called: Gen Surg, GI Admission status: Inpt tele I certify that at the point of admission it is my clinical judgment that the patient will require inpatient hospital care spanning beyond 2 midnights from the point of admission due to high intensity of service, high risk for further deterioration and high frequency of surveillance required.   Author: Tully FORBES Carwin, MD 01/27/2024 3:43 PM  For on call review [email or web address].

## 2024-01-27 NOTE — Consult Note (Signed)
 Gastroenterology Consult   Referring Provider: Dr. Kallie Primary Care Physician:  Shona Norleen PEDLAR, MD Primary Gastroenterologist:  Dr. Eartha   Patient ID: Stephen Burch; [id number]; 01-25-1956   Admit date: 01/26/2024  LOS: 0 days   Date of Consultation: 01/27/2024  Reason for Consultation:  Cirrhosis   History of Present Illness   Stephen Burch is a 68 y.o. 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 Kallie in May with recommendations to be seen by tertiary care if cholecystectomy, liver lesion s/p MR liver without suspicious findings, presenting this admission with abdominal pain and concern for cholecystitis. HIDA scan is pending official results, but this has been reviewed by Dr. Kallie and felt to be consistent with acute cholecystitis. GI has been requested to follow along in light of cirrhosis history and management if any decompensation.   Today: Tbili 4.8, similar to two months ago, Alk Phos 179, AST 42, ALT 41, no INR on file, Hgb 13.1, no leukocytosis. CT yesterday with cholelithiasis as below, diffuse colonic wall thickening unable to exclude colitis, diffuse wall thickening and inflammation of duodenum compatible with duodenitis, small volume ascites, wall thickening of distal esophagus.   His abdominal pain has improved with pain medication and antibiotics. Awaiting bed placement. Plans for admission here and monitoring, possible IR drain if needed, and expedited outpatient evaluation at [facility]. He denies any mental status changes, confusion, overt GI bleeding, nausea, vomiting. No ETOH use. Denies tense ascites or lower extremity edema.    Cirrhosis related questions: Hematemesis/coffee ground emesis: No Abdominal pain: Yes: cholelithiasis   Abdominal distention/worsening ascitesNo Fever/chills: No Episodes of confusion/disorientation: No Number of daily bowel movements:at least 3 bowel movements per day Taking diuretics?: Yes, furosemide  80mg  and spironolactone  200 mg qday History of variceal bleeding: No Prior history of banding?: No Prior episodes of SBP: No Last time liver imaging was performed:June 2025 MRI liver without HCC.  Last AFP:08/2023 2.5 MELD 3.0 score: 10/2023 17. Today: unable to calculate as no INR Currently consuming alcohol: No Hepatitis A-immune Hep B not immune     Last Colonoscopy:10/2023 - The examined portion of the ileum was normal.                           - 9 non-bleeding colonic angiodysplastic lesions.                            Treated with argon plasma coagulation (APC).                           - Two 1 to 2 mm polyps in the cecum, removed with a                            cold biopsy forceps. Resected and retrieved.                           - Four 3 to 6 mm polyps in the transverse colon and  in the cecum, removed with a cold snare. Resected                            and retrieved.                           - Two 3 to 4 mm polyps in the sigmoid colon and in                            the descending colon, removed with a cold snare.                            Resected and retrieved.                           - Rectal varices. CECUM AND TRANSVERSE COLON, POLYPECTOMY:  Tubular adenoma, multiple fragments  Negative for high-grade dysplasia and carcinoma   B. DESCENDING AND SIGMOID COLON, POLYPECTOMY:  Compatible hyperplastic polyps  Negative for dysplasia and carcinoma    Repeat TCS 3 years     Last EGD: 11/04/20 grade III esophageal varices, Type 2 gastroesophageal varices (GOV 2 esophageal varices which extend along the fundus) w/o bleeding, single gastric polyp. Did not tolerate NSBB, declined TIPS evaluation, declined updated EGD recently with IDA.    Past Medical History:  Diagnosis Date   Acute systolic (congestive) heart failure (HCC) 04/30/2018   Arthritis    knees, wrists   Barrett's esophagus    Cardiac arrest (HCC) 02/22/2016   Complication of anesthesia    02-22-2016 intraop cardiac arrest immediately after vancomyocin administration , pt cardiac arrest w/ Vfib,  please refer to anesthesia record in epic   Dysrhythmia    Afib 12/2018 - converted to NSR   Esophageal varices determined by endoscopy (HCC) 11/2016   grade 1   GERD (gastroesophageal reflux disease)    Hiatal hernia    History of adenomatous polyp of colon    History of asthma    as child   History of DVT (deep vein thrombosis)    right lower extremitty post-op right total knee surgery 09/ 2010,  treated w/ coumadin   History of esophageal dilatation 07/1998   for schatzski ring   History of kidney stones    History of staph infection 04/2016   MSSA infection of right total knee w/ sepsis   Hx of cardiac arrest    02-22-2016  intraoperative, immediately following moving pt into prone position and vancomyocin administration, cardiac arrest w/ Vfib (referred to anesthesia record in epic) pt extubated himself next day   Infection of prosthetic right knee joint (HCC)    Liver cirrhosis secondary to NASH (nonalcoholic steatohepatitis) (HCC)    NAFLD-- followed by dr golda---  liver bx 09-30-2012  mild portal and focal sinusoidal fibrosis   Pre-diabetes    Pyogenic arthritis of right knee joint (HCC) 04/02/2016   Scapholunate advanced collapse of left wrist    deformity   Shock circulatory (HCC)    Staphylococcus aureus bacteremia with sepsis (HCC)    Thrombocytopenia (HCC)    10-09-2017 per pt his platelets have always been low , followed by pcp, never been referred to hematologist    Past Surgical History:  Procedure Laterality Date   APPLICATION OF WOUND VAC Right 02/22/2016  Procedure: APPLICATION OF WOUND VAC;  Surgeon: Marcey Raman, MD;  Location:  [facility] OR;  Service: Orthopedics;  Laterality: Right;   BIOPSY  02/10/2020   Procedure: BIOPSY;  Surgeon: Golda Claudis PENNER, MD;  [facility]: [facility];  Service: Endoscopy;;   CARDIAC CATHETERIZATION N/A 02/24/2016   Procedure: Left Heart Cath and Coronary Angiography;  Surgeon: Lonni JONETTA Cash, MD;  Location: [facility] INVASIVE CV LAB;  Service: Cardiovascular;  Laterality: N/A;  No angiographic evidence of CAD,  normal LVSF, ef 50-55%   CARPECTOMY Left 10/17/2017   Procedure: LEFT WRIST PROXIMAL ROW CARPECTOMY WITH POSTEROR IMBROSSEOUS NERVE EXCISION;  Surgeon: Sissy Cough, MD;  Location: [facility] [place];  Service: Orthopedics;  Laterality: Left;  AXILLARY BLOCK   CARPECTOMY WITH RADIAL STYLOIDECTOMY Left 02/11/2019   Procedure: LEFT WRIST RADIAL STYLOIDECTOMY;  Surgeon: Sissy Cough, MD;  Location: [facility] OR;  Service: Orthopedics;  Laterality: Left;   COLONOSCOPY     COLONOSCOPY N/A 10/22/2023   Procedure: COLONOSCOPY;  Surgeon: Eartha Angelia Sieving, MD;  [facility];  Service: Gastroenterology;  Laterality: N/A;  9:45AM;ASA 3   COLONOSCOPY WITH PROPOFOL  N/A 02/10/2020   Procedure: COLONOSCOPY WITH PROPOFOL ;  Surgeon: Golda Claudis PENNER, MD;  [facility];  Service: Endoscopy;  Laterality: N/A;  955   EAR CYST EXCISION Right 09/06/2014   Procedure: OPEN EXCISION BAKER'S CYST RIGHT KNEE;  Surgeon: Marcey Raman, MD;  Location: [facility] OR;  Service: Orthopedics;  Laterality: Right;   ESOPHAGEAL BANDING  06/30/2020   Procedure: ESOPHAGEAL BANDING;  Surgeon: Golda Claudis PENNER, MD;  [facility];  Service: Endoscopy;;   ESOPHAGEAL DILATION  1996/ 2000   ESOPHAGOGASTRODUODENOSCOPY N/A 09/17/2012   Procedure: ESOPHAGOGASTRODUODENOSCOPY (EGD);  Surgeon: Claudis PENNER Golda, MD;  [facility];  Service: Endoscopy;  Laterality: N/A;  200   ESOPHAGOGASTRODUODENOSCOPY N/A 12/15/2015   Procedure: ESOPHAGOGASTRODUODENOSCOPY (EGD);  Surgeon: Claudis PENNER Golda, MD;   [facility];  Service: Endoscopy;  Laterality: N/A;  1245   ESOPHAGOGASTRODUODENOSCOPY N/A 12/17/2016   Procedure: ESOPHAGOGASTRODUODENOSCOPY (EGD);  Surgeon: Golda Claudis PENNER, MD;  [facility];  Service: Endoscopy;  Laterality: N/A;  210   ESOPHAGOGASTRODUODENOSCOPY N/A 06/30/2020   Procedure: ESOPHAGOGASTRODUODENOSCOPY (EGD);  Surgeon: Golda Claudis PENNER, MD;  [facility];  Service: Endoscopy;  Laterality: N/A;  11:15   ESOPHAGOGASTRODUODENOSCOPY (EGD) WITH PROPOFOL  N/A 02/10/2020   Procedure: ESOPHAGOGASTRODUODENOSCOPY (EGD) WITH PROPOFOL ;  Surgeon: Golda Claudis PENNER, MD;  [facility];  Service: Endoscopy;  Laterality: N/A;   ESOPHAGOGASTRODUODENOSCOPY (EGD) WITH PROPOFOL  N/A 11/04/2020   Procedure: ESOPHAGOGASTRODUODENOSCOPY (EGD) WITH PROPOFOL ;  Surgeon: Eartha Angelia Sieving, MD;  [facility];  Service: Gastroenterology;  Laterality: N/A;  9:45   I & D KNEE WITH POLY EXCHANGE Right 04/02/2016   Procedure: IRRIGATION AND DEBRIDEMENT RIGHT KNEE WITH POLY EXCHANGE;  Surgeon: Marcey Raman, MD;  Location: [facility];  Service: Orthopedics;  Laterality: Right;   INCISION AND DRAINAGE HEMATOMA POST LEFT  TOTAL KNEE  2012   INGUINAL HERNIA REPAIR Bilateral 05/22/2013   Procedure: REPAIR OF RECURRENT INCARCERATED INGUINAL HERNIA WITH MESH RIGHT SIDE,  REPAIR OF RECURRENT INGUINAL HERNIA WITH MESH LEFT SIDE;  Surgeon: Elon CHRISTELLA Pacini, MD;  Location: [facility] OR;  Service: General;  Laterality: Bilateral;   INGUINAL HERNIA REPAIR Bilateral 11/1997   INSERTION OF MESH Bilateral 05/22/2013   Procedure: INSERTION OF MESH;  Surgeon: Elon CHRISTELLA Pacini, MD;  Location: [facility] OR;  Service: General;  Laterality: Bilateral;  IRRIGATION AND DEBRIDEMENT KNEE Right 02/22/2016   Procedure: IRRIGATION AND DEBRIDEMENT KNEE;  Surgeon: Marcey Raman, MD;  Location: [facility] OR;  Service: Orthopedics;  Laterality: Right;   Irrigation and Debridement right knee  03/23/2009   Dr. Cleotilde,  [facility]   KNEE ARTHROSCOPY W/ SYNOVECTOMY Right 01/30/2010   AND DEBRIDEMENT OF HETEROTOPIC   LIVER BIOPSY  09/30/2012   REVISION TOTAL KNEE ARTHROPLASTY Left fall 2012   TONSILLECTOMY  child   TOTAL KNEE ARTHROPLASTY Bilateral right 09/ 2010/  left 09-18-2010   TRANSTHORACIC ECHOCARDIOGRAM  02/22/2016   ef 30-35% (per cardiac cath 02-24-2016 normal), severe hypokinesis of the mid-apicalanteroseptal and apical myocardium,  grade 1 diastolic dysfunction/ trivial PR and TR   TRIGGER FINGER RELEASE Left 02/11/2019   Procedure: LEFT WRIST STENOSING TENOSYNOVITIS RELEASE;  Surgeon: Sissy Cough, MD;  Location: [facility] OR;  Service: Orthopedics;  Laterality: Left;  MAC WITH AXILLARY BLOCK   WRIST ARTHROPLASTY Left 10/17/2017   Procedure: CAPITATE RESURFACING ARTHROPLASTY;  Surgeon: Sissy Cough, MD;  Location: [facility];  Service: Orthopedics;  Laterality: Left;    Prior to Admission medications   Medication Sig Start Date End Date Taking? Authorizing Provider  EPINEPHrine  (PRIMATENE  MIST) 0.125 MG/ACT AERO Inhale 1 puff into the lungs daily as needed (asthma).   Yes [provider]  ferrous sulfate  (FEROSUL) 325 (65 FE) MG tablet TAKE 1 TABLET BY MOUTH DAILY WITH BREAKFAST 01/14/24  Yes Carlan, [name] L, NP  furosemide  (LASIX ) 40 MG tablet Take 2 tablets by mouth once daily 11/21/23  Yes Carlan, [name] L, NP  glipiZIDE  (GLUCOTROL  XL) 10 MG 24 hr tablet Take 10 mg by mouth daily with breakfast.   Yes [provider]  sildenafil (VIAGRA) 100 MG tablet Take 100 mg by mouth daily as needed for erectile dysfunction.   Yes [provider]  spironolactone  (ALDACTONE ) 100 MG tablet Take 2 tablets by mouth once daily 11/20/23  Yes Carlan, [name] L, NP    Current Facility-Administered Medications  Medication Dose Route Frequency Provider Last Rate Last Admin   0.9 %  sodium chloride  infusion   Intravenous Continuous Kallie Manuelita BROCKS, MD 40 mL/hr at 01/27/24  1045 New Bag at 01/27/24 1045   ceFEPIme  (MAXIPIME ) 2 g in sodium chloride  0.9 % 100 mL IVPB  2 g Intravenous Q8H Elnor Savant A, DO   Stopped at 01/27/24 0915   HYDROmorphone  (DILAUDID ) injection 0.5 mg  0.5 mg Intravenous Q3H PRN Kallie Manuelita BROCKS, MD   0.5 mg at 01/27/24 0446   HYDROmorphone  (DILAUDID ) injection 1 mg  1 mg Intravenous Q3H PRN Kallie Manuelita BROCKS, MD       metroNIDAZOLE  (FLAGYL ) IVPB 500 mg  500 mg Intravenous Q12H Midge Golas, MD   Stopped at 01/27/24 1444   ondansetron  (ZOFRAN ) injection 4 mg  4 mg Intravenous Q6H PRN Kallie Manuelita BROCKS, MD   4 mg at 01/27/24 0446   Current Outpatient Medications  Medication Sig Dispense Refill   EPINEPHrine  (PRIMATENE  MIST) 0.125 MG/ACT AERO Inhale 1 puff into the lungs daily as needed (asthma).     ferrous sulfate  (FEROSUL) 325 (65 FE) MG tablet TAKE 1 TABLET BY MOUTH DAILY WITH BREAKFAST 60 tablet 5   furosemide  (LASIX ) 40 MG tablet Take 2 tablets by mouth once daily 180 tablet 0   glipiZIDE  (GLUCOTROL  XL) 10 MG 24 hr tablet Take 10 mg by mouth daily with breakfast.     sildenafil (VIAGRA) 100 MG tablet Take 100 mg by mouth daily as  needed for erectile dysfunction.     spironolactone  (ALDACTONE ) 100 MG tablet Take 2 tablets by mouth once daily 180 tablet 0    Allergies as of 01/26/2024 - Review Complete 01/26/2024  Allergen Reaction Noted   Asa [aspirin] Shortness Of Breath 04/01/2016   Penicillins Shortness Of Breath 07/04/2012   Vancomycin  Anaphylaxis 02/22/2016    Family History  Problem Relation Age of Onset   Arthritis Father        died age 81   GI Bleed Father        upper GI Bleed, non-ETOH cirrhosis   Arthritis Brother    Diabetes Brother        type 2   Diabetes Paternal Uncle        type 2   Heart attack Maternal Grandmother    Heart attack Maternal Grandfather    Colon cancer Neg Hx    Colon polyps Neg Hx     Social History   Socioeconomic History   Marital status: Legally Separated    Spouse name:  Not on file   Number of children: 2   Years of education: Not on file   Highest education level: 12th grade  Occupational History    Employer: [employer]    Comment: part time  Tobacco Use   Smoking status: Former    Current packs/day: 0.00    Average packs/day: 0.3 packs/day for 3.0 years (0.8 ttl pk-yrs)    Types: Cigarettes    Start date: 10/10/1971    Quit date: 10/10/1974    Years since quitting: 49.3    Passive exposure: Past   Smokeless tobacco: Never  Vaping Use   Vaping status: Never Used  Substance and Sexual Activity   Alcohol use: Not Currently   Drug use: No   Sexual activity: Yes  Other Topics Concern   Not on file  Social History Narrative   ** Merged History Encounter **       Pt lives alone.   Social Drivers of Corporate investment banker Strain: Low Risk  (11/16/2019)   Overall Financial Resource Strain (CARDIA)    Difficulty of Paying Living Expenses: Not hard at all  Food Insecurity: No Food Insecurity (12/12/2023)   Hunger Vital Sign    Worried About Running Out of Food in the Last Year: Never true    Ran Out of Food in the Last Year: Never true  Transportation Needs: No Transportation Needs (02/28/2021)   PRAPARE - Administrator, Civil Service (Medical): No    Lack of Transportation (Non-Medical): No  Physical Activity: Inactive (11/16/2019)   Exercise Vital Sign    Days of Exercise per Week: 0 days    Minutes of Exercise per Session: 0 min  Stress: No Stress Concern Present (11/16/2019)   [employer] of Occupational Health - Occupational Stress Questionnaire    Feeling of Stress : Not at all  Social Connections: Moderately Isolated (11/16/2019)   Social Connection and Isolation Panel    Frequency of Communication with Friends and Family: More than three times a week    Frequency of Social Gatherings with Friends and Family: More than three times a week    Attends Religious Services: More than 4 times per year    Active Member of  [employer] or Organizations: No    Attends Banker Meetings: Never    Marital Status: Separated  Intimate Partner Violence: Not At Risk (12/12/2023)   Humiliation, Afraid, Rape, and Kick  questionnaire    Fear of Current or Ex-Partner: No    Emotionally Abused: No    Physically Abused: No    Sexually Abused: No     Review of Systems   Gen: Denies any fever, chills, loss of appetite, change in weight or weight loss CV: Denies chest pain, heart palpitations, syncope, edema  Resp: Denies shortness of breath with rest, cough, wheezing, coughing up blood, and pleurisy. GI: Denies vomiting blood, jaundice, and fecal incontinence.   Denies dysphagia or odynophagia. GU : Denies urinary burning, blood in urine, urinary frequency, and urinary incontinence. MS: Denies joint pain, limitation of movement, swelling, cramps, and atrophy.  Derm: Denies rash, itching, dry skin, hives. Psych: Denies depression, anxiety, memory loss, hallucinations, and confusion. Heme: Denies bruising or bleeding Neuro:  Denies any headaches, dizziness, paresthesias, shaking  Physical Exam   Vital Signs in last 24 hours: Temp:  [98.1 F (36.7 C)-98.5 F (36.9 C)] 98.5 F (36.9 C) (07/28 0823) Pulse Rate:  [68-90] 82 (07/28 1345) Resp:  [11-23] 13 (07/28 1345) BP: (94-148)/(51-97) 117/65 (07/28 1345) SpO2:  [90 %-99 %] 94 % (07/28 1345) Weight:  [91 kg] 91 kg (07/27 2114)    General:   Alert,  Well-developed, well-nourished, pleasant and cooperative in NAD Head:  Normocephalic and atraumatic. Eyes:  mild scleral icterus Ears:  Normal auditory acuity. Mouth:  No deformity or lesions, dentition normal. Neck:  Supple; no masses Lungs:  Clear throughout to auscultation.    Heart:  S1 S2 present without murmurs Abdomen:  Soft, TTP RUQ, hepatomegaly, no obvious tense ascites, +BS Rectal: deferred   Msk:  Symmetrical without gross deformities. Normal posture. Extremities:  Without  edema. Neurologic:   Alert and  oriented x4. Skin:  Intact without significant lesions or rashes. Psych:  Alert and cooperative. Normal mood and affect.  Intake/Output from previous day: 07/27 0701 - 07/28 0700 In: 705.5 [IV Piggyback:705.5] Out: -  Intake/Output this shift: Total I/O In: 17.2 [I.V.:17.2] Out: -     Labs/Studies   Recent Labs Recent Labs    01/26/24 2139 01/27/24 0723  WBC 3.3* 4.8  HGB 13.0 13.1  HCT 38.3* 37.9*  PLT 71* 66*   BMET Recent Labs    01/26/24 2139 01/27/24 0723  NA 139 136  K 3.7 3.9  CL 102 104  CO2 24 26  GLUCOSE 192* 165*  BUN 15 15  CREATININE 0.85 0.77  CALCIUM 8.5* 8.2*   LFT Recent Labs    01/26/24 2139 01/27/24 0723  PROT 6.8 6.3*  ALBUMIN 3.6 3.2*  AST 40 42*  ALT 40 41  ALKPHOS 191* 179*  BILITOT 3.9* 4.8*   Radiology/Studies NM Hepatobiliary Liver Func Result Date: 01/27/2024 CLINICAL DATA:  Right upper quadrant pain EXAM: NUCLEAR MEDICINE HEPATOBILIARY IMAGING TECHNIQUE: Sequential images of the abdomen were obtained out to 60 minutes following intravenous administration of radiopharmaceutical. Additional imaging for 30 minutes was obtained after the administration of 2 mg of IV morphine  x1. RADIOPHARMACEUTICALS:  5.2 mCi Tc-47m  Choletec  IV COMPARISON:  Ultrasound 01/19/2024.  CT 01/26/2024. FINDINGS: Prompt uptake and biliary excretion of activity by the liver is seen. Biliary activity passes into small bowel, consistent with patent common bile duct. Initially gallbladder activity is not seen. After administration of morphine  the gallbladder is still non visible. IMPRESSION: No common duct obstruction. However even despite the administration of IV morphine , the gallbladder is not seen. This has a differential including acute cholecystitis. Please correlate with clinical presentation. Electronically Signed  By: Ranell Bring M.D.   On: 01/27/2024 14:45   US  Abdomen Limited RUQ (LIVER/GB) Result Date: 01/27/2024 CLINICAL DATA:  Right  upper quadrant abdominal pain for proximally 12 hours. EXAM: ULTRASOUND ABDOMEN LIMITED RIGHT UPPER QUADRANT COMPARISON:  Abdomen and pelvis CT dated 01/26/2024 and 11/03/2023. Right upper quadrant abdomen ultrasound dated 11/03/2023. FINDINGS: Gallbladder: Dilated with indistinct wall thickening measuring up to 7 mm in thickness. No pericholecystic fluid. Sludge in the gallbladder. Multiple stones seen in the gallbladder neck and cystic duct on the CTs dated 01/26/2024 and 11/03/2023 are not visualized sonographically. This area is obscured by overlying bowel-gas. Positive sonographic Murphy sign. Common bile duct: Diameter: 4.1 mm Liver: Changes of cirrhosis with extensive nodular contours. Portal vein is poorly visualized on color Doppler imaging, patent on the CT obtained yesterday. The direction of flow was difficult to establish on today's exam. Other: None. IMPRESSION: 1. Findings suspicious for acute cholecystitis including multiple stones in the gallbladder neck and cystic duct on the recent CT, large enough to cause cystic duct obstruction; positive sonographic Murphy sign, diffuse gallbladder wall thickening, gallbladder dilatation and sludge in the gallbladder. 2. Changes of cirrhosis of the liver. 3. Poorly visualized portal vein on color Doppler, making direction of flow difficult to determine. Electronically Signed   By: Elspeth Bathe M.D.   On: 01/27/2024 10:41   CT ABDOMEN PELVIS W CONTRAST Result Date: 01/26/2024 CLINICAL DATA:  Right upper quadrant EXAM: CT ABDOMEN AND PELVIS WITH CONTRAST TECHNIQUE: Multidetector CT imaging of the abdomen and pelvis was performed using the standard protocol following bolus administration of intravenous contrast. RADIATION DOSE REDUCTION: This exam was performed according to the departmental dose-optimization program which includes automated exposure control, adjustment of the mA and/or kV according to patient size and/or use of iterative reconstruction  technique. CONTRAST:  OMNIPAQUE  IOHEXOL  300 MG/ML  SOLN COMPARISON:  MRI of the abdomen 12/16/2023. FINDINGS: Lower chest: No acute abnormality. Hepatobiliary: Nodular liver contour is again seen compatible with cirrhosis. Gallstones are present. The gallbladder is distended there is mild inflammatory stranding surrounding the gallbladder. No biliary ductal dilatation identified. Pancreas: Unremarkable. No pancreatic ductal dilatation or surrounding inflammatory changes. Spleen: The spleen is enlarged, unchanged. Adrenals/Urinary Tract: Rounded hypodensities in the kidneys are too small to characterize, likely cyst. The adrenal glands, kidneys and bladder are otherwise within normal limits. Stomach/Bowel: There is diffuse colonic wall thickening most significant in the ascending colon. There is also diffuse wall thickening and inflammation of the entire duodenum. Appendix is not definitely seen. There is no bowel obstruction, pneumatosis or free air. There is wall thickening of the distal esophagus. Vascular/Lymphatic: Aorta and IVC are normal in size. No enlarged lymph nodes are seen. Left-sided varicocele and splenic varices as well as gastric varices are again noted. Reproductive: Prostate gland is mildly enlarged. Other: There is small volume ascites. Musculoskeletal: Degenerative changes affect the spine. IMPRESSION: 1. Cholelithiasis with distended gallbladder and mild inflammatory stranding surrounding the gallbladder. Findings are concerning for acute cholecystitis. 2. Diffuse colonic wall thickening most significant in the ascending colon worrisome for colitis. 3. Diffuse wall thickening and inflammation of the duodenum compatible with duodenitis. 4. Cirrhosis with sequela of portal hypertension including splenomegaly, varices, and small volume ascites. 5. Wall thickening of the distal esophagus worrisome for esophagitis. Electronically Signed   By: Greig Pique M.D.   On: 01/26/2024 23:08      Assessment   Stephen Burch is a 52 y.o. year old male  with past medical  history of MASH cirrhosis complicated by grade 3 esophgeal varices, Type II GOV varices, intolerant to NSBB and declining TIPS evaluation in the past, GERD complicated by Barrett's esophagus without dysplasia, IDA,  hx of intraoperative cardiac arrest, cholelithiasis and evaluated as outpatient by Dr. Kallie in May with recommendations to be seen by tertiary care if cholecystectomy, liver lesion s/p MR liver without suspicious findings, presenting this admission with acute cholecystitis, and GI has been requested to follow along in light of cirrhosis history and management if any decompensation.   Cholecystitis: Dr. Kallie has evaluated patient with plans for conservative management and antibiotic therapy, IR drain at [facility] if no improvement. See notes regarding tertiary consultation.   Cirrhosis: due to [facility]. Last MELD 3.0 in May 2025 was 17. Unable to calculate MELD today as no INR. Will add on for tomorrow's labs. Cirrhosis complicated by known esophageal and gastric varices and has been intolerant to NSBB in past, declined updated EGD, and declined TIPS evaluation. Currently without any concerns for overt GI bleeding, hepatic encephalopathy, SBP, etc. GI will continue to follow during hospitalization and recommend tertiary evaluation expeditiously as outpatient. If decompensation, can start process for transfer to tertiary care.     Plan / Recommendations    Daily MELD labs, INR Monitor for any overt GI bleeding, mental status changes, confusion If decompensation, reach out to tertiary for transfer Continue with supportive measures, IV antibiotics, pursue IR if no improvement Outpatient expedited evaluation at [facility]     01/27/2024, 3:10 PM  Stephen MICAEL Stager, PhD, ANP-BC [facility]

## 2024-01-27 NOTE — Progress Notes (Addendum)
 [facility]  HIDA looking like acute cholecystitis, which is expected. He has about 10 minutes left on the HIDA after morphine  given.   Talked to [facility], Dr. Hildreth EGS, and he recommends [facility] for more definitive treatment. The patient would get an IR cholecystostomy tube if he came to [facility] more than likely.  Pain was improved with antibiotics and his vitals are stable. LFTs trending up slightly, WBC wnl.  I have discussed the options with the patient's and the limitations.   I have discussed the case with Dr. Mavis, Dr. Cindie, Dr. Elnor in the ED. Given that [facility] is full, that [facility] cannot do anything that we cannot do, and that he will need [facility] evaluation eventually, we will just plan for hospitalist admission here.  PRN for pain IS, OOB Can have clear diet Antibiotics for cholecystitis LFTs and labs daily GI to consult to help with management/ pending any worsening and to get outpatient follow up at [facility]   Total time spent in caring for this patient today, calling outside facilities, and helping with his care has been well over 90 minutes.   Manuelita Pander, MD [facility] [address] [place], [place] [address] [phone] (office)

## 2024-01-27 NOTE — ED Notes (Signed)
 Pt transferred to [facility] for scan at this time

## 2024-01-27 NOTE — ED Notes (Signed)
 Per Manuelita Pander, MD,  pt plan of care is for him to stay here, antibioics, monitoring, if fails ,IR drain at [facility], and pt will need [facility]paient evaluation at [facility] once he leaves

## 2024-01-27 NOTE — Assessment & Plan Note (Addendum)
 CT abdomen and pelvis with contrast yesterday with findings concerning for acute cholecystitis, confirmed on RUQ Us .  HIDA scan also done. - General surgeon Dr. Kallie was consulted-initial plan was to transfer patient to tertiary care center due to patient's comorbidities of liver cirrhosis and portal hypertension.  Unfortunately [facility] is at capacity, and [facility] -would do essentially what can do here.  Hence hospitalization requested here for antibiotics, and likely IR cholecystectomy tube placement. - Per general surgery clear liquid diet -IV Dilaudid  0.5  PRN -IV cefepime  and metronidazole  -Zofran  as needed -Daily LFTs - 500 mL bolus given, continue N/s 40 cc/hr

## 2024-01-28 DIAGNOSIS — K810 Acute cholecystitis: Secondary | ICD-10-CM | POA: Diagnosis not present

## 2024-01-28 DIAGNOSIS — K819 Cholecystitis, unspecified: Secondary | ICD-10-CM

## 2024-01-28 DIAGNOSIS — K7460 Unspecified cirrhosis of liver: Secondary | ICD-10-CM | POA: Diagnosis not present

## 2024-01-28 DIAGNOSIS — K7581 Nonalcoholic steatohepatitis (NASH): Secondary | ICD-10-CM | POA: Diagnosis not present

## 2024-01-28 LAB — CBC
HCT: 35.1 % — ABNORMAL LOW (ref 39.0–52.0)
Hemoglobin: 12.3 g/dL — ABNORMAL LOW (ref 13.0–17.0)
MCH: 32.6 pg (ref 26.0–34.0)
MCHC: 35 g/dL (ref 30.0–36.0)
MCV: 93.1 fL (ref 80.0–100.0)
Platelets: 63 K/uL — ABNORMAL LOW (ref 150–400)
RBC: 3.77 MIL/uL — ABNORMAL LOW (ref 4.22–5.81)
RDW: 13.2 % (ref 11.5–15.5)
WBC: 5.6 K/uL (ref 4.0–10.5)
nRBC: 0 % (ref 0.0–0.2)

## 2024-01-28 LAB — PROTIME-INR
INR: 1.5 — ABNORMAL HIGH (ref 0.8–1.2)
Prothrombin Time: 18.7 s — ABNORMAL HIGH (ref 11.4–15.2)

## 2024-01-28 LAB — HIV ANTIBODY (ROUTINE TESTING W REFLEX): HIV Screen 4th Generation wRfx: NONREACTIVE

## 2024-01-28 LAB — COMPREHENSIVE METABOLIC PANEL WITH GFR
ALT: 36 U/L (ref 0–44)
AST: 32 U/L (ref 15–41)
Albumin: 2.8 g/dL — ABNORMAL LOW (ref 3.5–5.0)
Alkaline Phosphatase: 155 U/L — ABNORMAL HIGH (ref 38–126)
Anion gap: 5 (ref 5–15)
BUN: 16 mg/dL (ref 8–23)
CO2: 24 mmol/L (ref 22–32)
Calcium: 7.9 mg/dL — ABNORMAL LOW (ref 8.9–10.3)
Chloride: 105 mmol/L (ref 98–111)
Creatinine, Ser: 0.6 mg/dL — ABNORMAL LOW (ref 0.61–1.24)
GFR, Estimated: >60 mL/min (ref >60)
Glucose, Bld: 122 mg/dL — ABNORMAL HIGH (ref 70–99)
Potassium: 3.7 mmol/L (ref 3.5–5.1)
Sodium: 134 mmol/L — ABNORMAL LOW (ref 135–145)
Total Bilirubin: 9.1 mg/dL — ABNORMAL HIGH (ref 0.0–1.2)
Total Protein: 5.8 g/dL — ABNORMAL LOW (ref 6.5–8.1)

## 2024-01-28 LAB — GLUCOSE, CAPILLARY
Glucose-Capillary: 118 mg/dL — ABNORMAL HIGH (ref 70–99)
Glucose-Capillary: 130 mg/dL — ABNORMAL HIGH (ref 70–99)
Glucose-Capillary: 183 mg/dL — ABNORMAL HIGH (ref 70–99)
Glucose-Capillary: 216 mg/dL — ABNORMAL HIGH (ref 70–99)
Glucose-Capillary: 93 mg/dL (ref 70–99)

## 2024-01-28 LAB — HEMOGLOBIN AND HEMATOCRIT, BLOOD
HCT: 37.3 % — ABNORMAL LOW (ref 39.0–52.0)
Hemoglobin: 12.7 g/dL — ABNORMAL LOW (ref 13.0–17.0)

## 2024-01-28 MED ORDER — PANTOPRAZOLE SODIUM 40 MG IV SOLR
40.0000 mg | Freq: Two times a day (BID) | INTRAVENOUS | Status: DC
  Administered 2024-01-28 – 2024-01-30 (×5): 40 mg via INTRAVENOUS
  Filled 2024-01-28 (×6): qty 10

## 2024-01-28 MED ORDER — SODIUM CHLORIDE 0.9 % IV SOLN
1.0000 g | Freq: Three times a day (TID) | INTRAVENOUS | Status: DC
  Administered 2024-01-28 – 2024-01-30 (×6): 1 g via INTRAVENOUS
  Filled 2024-01-28 (×7): qty 20

## 2024-01-28 NOTE — Plan of Care (Signed)
 Ambulating and denies pain; tolerating advanced diet

## 2024-01-28 NOTE — Progress Notes (Signed)
 PROGRESS NOTE     Stephen Burch, is a 68 y.o. male, DOB - 1955/11/11, FMW:[id number]  Admit date - 01/26/2024   Admitting Physician Tully FORBES Carwin, MD  Outpatient Primary MD for the patient is Shona Norleen PEDLAR, MD  LOS - 1  Chief Complaint  Patient presents with   Abdominal Pain        Brief Narrative:   68 y.o. male with medical history significant for systolic CHF, liver cirrhosis, cardiac arrest 2017, esophageal varices, atrial fibrillation, hypothyroidism, diabetes mellitus admitted on 01/27/2024 with acute cholecystitis setting of liver cirrhosis    -Assessment and Plan: 1)Acute cholecystitis CT abdomen and pelvis with contrast and  RUQ US --are consistent with acute cholecystitis  -Bilirubin up but hida had shown no obstruction of the CBD. -If Bilirubin continues to trend up, may need MRCP - General surgeon Dr. Kallie was consulted-initial plan was to transfer patient to tertiary care center due to patient's comorbidities of liver cirrhosis and portal hypertension.  Unfortunately [facility] is at capacity, and [facility] -would do essentially what can do here.   -- Discussed with general surgeon Dr. Kallie and GI team - Try to avoid lap chole as this may precipitate liver failure that may necessitate transplant- -if lap chole needed patient will have to be added liver transplant Able facility -- Treat empirically with IV antibiotics at this time - Escalated from cefepime /Flagyl  to meropenem  on 01/28/2019 -- Consider IR cholecystectomy tube placement if patient fails conservative management with antibiotics - Continue as needed opiates and antiemetics -Continue Protonix  for possible reactive duodenitis and  for stress ulcer prevention  2)History of atrial fibrillation Not on anticoagulation or rate limiting meds.  Chronic thrombocytopenia.  3)T2DM (type 2 diabetes mellitus) (HCC) Controlled.  A1c 5.5- 10/2023--reflecting excellent diabetic control PTA - Hold glipizide   especially since oral intake is unreliable due to #1 above Use Novolog /Humalog Sliding scale insulin  with Accu-Cheks/Fingersticks as ordered   4)Liver cirrhosis secondary to NASH (HCC) Appears stable and compensated.   -Chronic thrombocytopenia over the past 2 years platelets have ranged from 65-99. - Holding Lasix  80mg  daily and spironolactone  for now--after BP -Gentle hydration for now - No encephalopathy  5) acute on chronic anemia--history of chronic anemia - Hgb was 13.1 on admission probably due to hemoconcentration - Hgb trended down with hydration but still above 12 - Patient reports dark stools but he has been on iron therapy at home - Protonix  as above in # 1 - Monitor H&H closely especially given thrombocytopenia with increased risk for bleeding  Status is: Inpatient  Disposition: The patient is from: Home              Anticipated d/c is to: Home              Anticipated d/c date is: > 3 days              Patient currently is not medically stable to d/c. Barriers: Not Clinically Stable-   Code Status : -  Code Status: Full Code   Family Communication:    NA (patient is alert, awake and coherent) None at bedside DVT Prophylaxis  :   - SCDs  SCDs Start: 01/27/24 1655   Lab Results  Component Value Date   PLT 63 (L) 01/28/2024    Inpatient Medications  Scheduled Meds:  insulin  aspart  0-9 Units Subcutaneous Q6H   pantoprazole  (PROTONIX ) IV  40 mg Intravenous Q12H   Continuous Infusions:  meropenem  (MERREM ) IV Stopped (01/28/24  1440)   PRN Meds:.HYDROmorphone  (DILAUDID ) injection, HYDROmorphone  (DILAUDID ) injection, ondansetron  (ZOFRAN ) IV, mouth rinse, polyethylene glycol   Anti-infectives (From admission, onward)    Start     Dose/Rate Route Frequency Ordered Stop   01/28/24 1400  meropenem  (MERREM ) 1 g in sodium chloride  0.9 % 100 mL IVPB        1 g 200 mL/hr over 30 Minutes Intravenous Every 8 hours 01/28/24 0915     01/27/24 0830  ceFEPIme  (MAXIPIME )  2 g in sodium chloride  0.9 % 100 mL IVPB  Status:  Discontinued        2 g 200 mL/hr over 30 Minutes Intravenous Every 8 hours 01/27/24 0743 01/28/24 0915   01/27/24 0700  metroNIDAZOLE  (FLAGYL ) IVPB 500 mg  Status:  Discontinued        500 mg 100 mL/hr over 60 Minutes Intravenous Every 12 hours 01/27/24 0654 01/28/24 0915   01/26/24 2330  ceFEPIme  (MAXIPIME ) 2 g in sodium chloride  0.9 % 100 mL IVPB        2 g 200 mL/hr over 30 Minutes Intravenous  Once 01/26/24 2316 01/27/24 0043   01/26/24 2330  metroNIDAZOLE  (FLAGYL ) IVPB 500 mg        500 mg 100 mL/hr over 60 Minutes Intravenous  Once 01/26/24 2316 01/27/24 0043       Subjective: Stephen Burch today has no fevers, no emesis,  No chest pain,  - GI PA and GI attending Dr. Eartha at bedside - Abd pain improving  Objective: Vitals:   01/28/24 0300 01/28/24 0829 01/28/24 1056 01/28/24 1320  BP: (!) 124/59 132/72 134/74 (!) 161/71  Pulse: 81 84 80 78  Resp:      Temp: 100.3 F (37.9 C) (!) 100.5 F (38.1 C) 100.1 F (37.8 C) 100.3 F (37.9 C)  TempSrc: Oral Oral Oral Oral  SpO2: 96% 95% 98% 100%  Weight:      Height:        Intake/Output Summary (Last 24 hours) at 01/28/2024 1855 Last data filed at 01/28/2024 1745 Gross per 24 hour  Intake 340 ml  Output --  Net 340 ml   Filed Weights   01/26/24 2114  Weight: 91 kg    Physical Exam  Gen:- Awake Alert, no acute distress HEENT:- [place].AT,  +ve sclera icterus Neck-Supple Neck,No JVD,.  Lungs-  CTAB , fair symmetrical air movement CV- S1, S2 normal, regular  Abd-  +ve B.Sounds, Abd Soft, right upper quadrant tenderness improving  Extremity/Skin:- No  edema, pedal pulses present  Psych-affect is appropriate, oriented x3 Neuro-no new focal deficits, no tremors  Data Reviewed: I have personally reviewed following labs and imaging studies  CBC: Recent Labs  Lab 01/23/24 0934 01/26/24 2139 01/27/24 0723 01/28/24 0418 01/28/24 1331  WBC 2.7* 3.3* 4.8 5.6  --    NEUTROABS 2.0  --  4.1  --   --   HGB 13.2 13.0 13.1 12.3* 12.7*  HCT 38.0* 38.3* 37.9* 35.1* 37.3*  MCV 92.0 93.4 93.1 93.1  --   PLT 65* 71* 66* 63*  --    Basic Metabolic Panel: Recent Labs  Lab 01/26/24 2139 01/27/24 0723 01/28/24 0418  NA 139 136 134*  K 3.7 3.9 3.7  CL 102 104 105  CO2 24 26 24   GLUCOSE 192* 165* 122*  BUN 15 15 16   CREATININE 0.85 0.77 0.60*  CALCIUM 8.5* 8.2* 7.9*   GFR: Estimated Creatinine Clearance: 98.9 mL/min (A) (by C-G formula based on SCr of 0.6  mg/dL (L)). Liver Function Tests: Recent Labs  Lab 01/26/24 2139 01/27/24 0723 01/28/24 0418  AST 40 42* 32  ALT 40 41 36  ALKPHOS 191* 179* 155*  BILITOT 3.9* 4.8* 9.1*  PROT 6.8 6.3* 5.8*  ALBUMIN 3.6 3.2* 2.8*     Radiology Studies: NM Hepatobiliary Liver Func Result Date: 01/27/2024 CLINICAL DATA:  Right upper quadrant pain EXAM: NUCLEAR MEDICINE HEPATOBILIARY IMAGING TECHNIQUE: Sequential images of the abdomen were obtained out to 60 minutes following intravenous administration of radiopharmaceutical. Additional imaging for 30 minutes was obtained after the administration of 2 mg of IV morphine  x1. RADIOPHARMACEUTICALS:  5.2 mCi Tc-80m  Choletec  IV COMPARISON:  Ultrasound 01/19/2024.  CT 01/26/2024. FINDINGS: Prompt uptake and biliary excretion of activity by the liver is seen. Biliary activity passes into small bowel, consistent with patent common bile duct. Initially gallbladder activity is not seen. After administration of morphine  the gallbladder is still non visible. IMPRESSION: No common duct obstruction. However even despite the administration of IV morphine , the gallbladder is not seen. This has a differential including acute cholecystitis. Please correlate with clinical presentation. Electronically Signed   By: Ranell Bring M.D.   On: 01/27/2024 14:45   US  Abdomen Limited RUQ (LIVER/GB) Result Date: 01/27/2024 CLINICAL DATA:  Right upper quadrant abdominal pain for proximally 12  hours. EXAM: ULTRASOUND ABDOMEN LIMITED RIGHT UPPER QUADRANT COMPARISON:  Abdomen and pelvis CT dated 01/26/2024 and 11/03/2023. Right upper quadrant abdomen ultrasound dated 11/03/2023. FINDINGS: Gallbladder: Dilated with indistinct wall thickening measuring up to 7 mm in thickness. No pericholecystic fluid. Sludge in the gallbladder. Multiple stones seen in the gallbladder neck and cystic duct on the CTs dated 01/26/2024 and 11/03/2023 are not visualized sonographically. This area is obscured by overlying bowel-gas. Positive sonographic Murphy sign. Common bile duct: Diameter: 4.1 mm Liver: Changes of cirrhosis with extensive nodular contours. Portal vein is poorly visualized on color Doppler imaging, patent on the CT obtained yesterday. The direction of flow was difficult to establish on today's exam. Other: None. IMPRESSION: 1. Findings suspicious for acute cholecystitis including multiple stones in the gallbladder neck and cystic duct on the recent CT, large enough to cause cystic duct obstruction; positive sonographic Murphy sign, diffuse gallbladder wall thickening, gallbladder dilatation and sludge in the gallbladder. 2. Changes of cirrhosis of the liver. 3. Poorly visualized portal vein on color Doppler, making direction of flow difficult to determine. Electronically Signed   By: Elspeth Bathe M.D.   On: 01/27/2024 10:41   CT ABDOMEN PELVIS W CONTRAST Result Date: 01/26/2024 CLINICAL DATA:  Right upper quadrant EXAM: CT ABDOMEN AND PELVIS WITH CONTRAST TECHNIQUE: Multidetector CT imaging of the abdomen and pelvis was performed using the standard protocol following bolus administration of intravenous contrast. RADIATION DOSE REDUCTION: This exam was performed according to the departmental dose-optimization program which includes automated exposure control, adjustment of the mA and/or kV according to patient size and/or use of iterative reconstruction technique. CONTRAST:  OMNIPAQUE  IOHEXOL  300 MG/ML   SOLN COMPARISON:  MRI of the abdomen 12/16/2023. FINDINGS: Lower chest: No acute abnormality. Hepatobiliary: Nodular liver contour is again seen compatible with cirrhosis. Gallstones are present. The gallbladder is distended there is mild inflammatory stranding surrounding the gallbladder. No biliary ductal dilatation identified. Pancreas: Unremarkable. No pancreatic ductal dilatation or surrounding inflammatory changes. Spleen: The spleen is enlarged, unchanged. Adrenals/Urinary Tract: Rounded hypodensities in the kidneys are too small to characterize, likely cyst. The adrenal glands, kidneys and bladder are otherwise within normal limits. Stomach/Bowel: There is  diffuse colonic wall thickening most significant in the ascending colon. There is also diffuse wall thickening and inflammation of the entire duodenum. Appendix is not definitely seen. There is no bowel obstruction, pneumatosis or free air. There is wall thickening of the distal esophagus. Vascular/Lymphatic: Aorta and IVC are normal in size. No enlarged lymph nodes are seen. Left-sided varicocele and splenic varices as well as gastric varices are again noted. Reproductive: Prostate gland is mildly enlarged. Other: There is small volume ascites. Musculoskeletal: Degenerative changes affect the spine. IMPRESSION: 1. Cholelithiasis with distended gallbladder and mild inflammatory stranding surrounding the gallbladder. Findings are concerning for acute cholecystitis. 2. Diffuse colonic wall thickening most significant in the ascending colon worrisome for colitis. 3. Diffuse wall thickening and inflammation of the duodenum compatible with duodenitis. 4. Cirrhosis with sequela of portal hypertension including splenomegaly, varices, and small volume ascites. 5. Wall thickening of the distal esophagus worrisome for esophagitis. Electronically Signed   By: Greig Pique M.D.   On: 01/26/2024 23:08     Scheduled Meds:  insulin  aspart  0-9 Units Subcutaneous  Q6H   pantoprazole  (PROTONIX ) IV  40 mg Intravenous Q12H   Continuous Infusions:  meropenem  (MERREM ) IV Stopped (01/28/24 1440)     LOS: 1 day    Rendall Carwin M.D on 01/28/2024 at 6:55 PM  Go to [email or web address] - for contact info  [facility] - Office  [phone]  If 7PM-7AM, please contact night-coverage [email or web address] 01/28/2024, 6:55 PM

## 2024-01-28 NOTE — TOC CM/SW Note (Signed)
 Transition of Care [facility]) - Inpatient Brief Assessment   Patient Details  Name: Stephen Burch MRN: [id number] Date of Birth: 16-Apr-1956  Transition of Care [facility]) CM/SW Contact:    Lucie Lunger, LCSWA Phone Number: 01/28/2024, 8:45 AM   Clinical Narrative: Transition of Care Department [facility]) has reviewed patient and no TOC needs have been identified at this time. We will continue to monitor patient advancement through interdiciplinary progression rounds. If new patient transition needs arise, please place a TOC consult.  Transition of Care Asessment: Insurance and Status: Insurance coverage has been reviewed Patient has primary care physician: Yes Home environment has been reviewed: From home Prior level of function:: Independent Prior/Current Home Services: No current home services Social Drivers of Health Review: SDOH reviewed no interventions necessary Readmission risk has been reviewed: Yes Transition of care needs: no transition of care needs at this time

## 2024-01-28 NOTE — Progress Notes (Signed)
 [facility] Progress Note     Subjective: Some fevers recorded. Pain improved. Discussed increasing his antibiotic to merrem  with Pharmacy and team this AM. Adjustments made. Bilirubin up but hida had shown no obstruction of the CBD.   Gi is seeing him and appreciate their assistance.   Denies nausea/vomiting.  Objective: Vital signs in last 24 hours: Temp:  [99.2 F (37.3 C)-101.3 F (38.5 C)] 100.3 F (37.9 C) (07/29 1320) Pulse Rate:  [78-90] 78 (07/29 1320) Resp:  [18-20] 18 (07/28 1900) BP: (91-161)/(53-82) 161/71 (07/29 1320) SpO2:  [93 %-100 %] 100 % (07/29 1320) Last BM Date : 01/28/24  Intake/Output from previous day: 07/28 0701 - 07/29 0700 In: 482.6 [I.V.:304.5; IV Piggyback:178.1] Out: -  Intake/Output this shift: Total I/O In: 240 [P.O.:240] Out: -   General appearance: alert and no distress GI: soft, less RUQ tenderness, some distention   Lab Results:  Recent Labs    01/27/24 0723 01/28/24 0418  WBC 4.8 5.6  HGB 13.1 12.3*  HCT 37.9* 35.1*  PLT 66* 63*   BMET Recent Labs    01/27/24 0723 01/28/24 0418  NA 136 134*  K 3.9 3.7  CL 104 105  CO2 26 24  GLUCOSE 165* 122*  BUN 15 16  CREATININE 0.77 0.60*  CALCIUM 8.2* 7.9*   PT/INR No results for input(s): LABPROT, INR in the last 72 hours.  Studies/Results: NM Hepatobiliary Liver Func Result Date: 01/27/2024 CLINICAL DATA:  Right upper quadrant pain EXAM: NUCLEAR MEDICINE HEPATOBILIARY IMAGING TECHNIQUE: Sequential images of the abdomen were obtained out to 60 minutes following intravenous administration of radiopharmaceutical. Additional imaging for 30 minutes was obtained after the administration of 2 mg of IV morphine  x1. RADIOPHARMACEUTICALS:  5.2 mCi Tc-35m  Choletec  IV COMPARISON:  Ultrasound 01/19/2024.  CT 01/26/2024. FINDINGS: Prompt uptake and biliary excretion of activity by the liver is seen. Biliary activity passes into small bowel, consistent with patent  common bile duct. Initially gallbladder activity is not seen. After administration of morphine  the gallbladder is still non visible. IMPRESSION: No common duct obstruction. However even despite the administration of IV morphine , the gallbladder is not seen. This has a differential including acute cholecystitis. Please correlate with clinical presentation. Electronically Signed   By: Ranell Bring M.D.   On: 01/27/2024 14:45   US  Abdomen Limited RUQ (LIVER/GB) Result Date: 01/27/2024 CLINICAL DATA:  Right upper quadrant abdominal pain for proximally 12 hours. EXAM: ULTRASOUND ABDOMEN LIMITED RIGHT UPPER QUADRANT COMPARISON:  Abdomen and pelvis CT dated 01/26/2024 and 11/03/2023. Right upper quadrant abdomen ultrasound dated 11/03/2023. FINDINGS: Gallbladder: Dilated with indistinct wall thickening measuring up to 7 mm in thickness. No pericholecystic fluid. Sludge in the gallbladder. Multiple stones seen in the gallbladder neck and cystic duct on the CTs dated 01/26/2024 and 11/03/2023 are not visualized sonographically. This area is obscured by overlying bowel-gas. Positive sonographic Murphy sign. Common bile duct: Diameter: 4.1 mm Liver: Changes of cirrhosis with extensive nodular contours. Portal vein is poorly visualized on color Doppler imaging, patent on the CT obtained yesterday. The direction of flow was difficult to establish on today's exam. Other: None. IMPRESSION: 1. Findings suspicious for acute cholecystitis including multiple stones in the gallbladder neck and cystic duct on the recent CT, large enough to cause cystic duct obstruction; positive sonographic Murphy sign, diffuse gallbladder wall thickening, gallbladder dilatation and sludge in the gallbladder. 2. Changes of cirrhosis of the liver. 3. Poorly visualized portal vein on color Doppler, making direction of flow difficult  to determine. Electronically Signed   By: Elspeth Bathe M.D.   On: 01/27/2024 10:41   CT ABDOMEN PELVIS W  CONTRAST Result Date: 01/26/2024 CLINICAL DATA:  Right upper quadrant EXAM: CT ABDOMEN AND PELVIS WITH CONTRAST TECHNIQUE: Multidetector CT imaging of the abdomen and pelvis was performed using the standard protocol following bolus administration of intravenous contrast. RADIATION DOSE REDUCTION: This exam was performed according to the departmental dose-optimization program which includes automated exposure control, adjustment of the mA and/or kV according to patient size and/or use of iterative reconstruction technique. CONTRAST:  OMNIPAQUE  IOHEXOL  300 MG/ML  SOLN COMPARISON:  MRI of the abdomen 12/16/2023. FINDINGS: Lower chest: No acute abnormality. Hepatobiliary: Nodular liver contour is again seen compatible with cirrhosis. Gallstones are present. The gallbladder is distended there is mild inflammatory stranding surrounding the gallbladder. No biliary ductal dilatation identified. Pancreas: Unremarkable. No pancreatic ductal dilatation or surrounding inflammatory changes. Spleen: The spleen is enlarged, unchanged. Adrenals/Urinary Tract: Rounded hypodensities in the kidneys are too small to characterize, likely cyst. The adrenal glands, kidneys and bladder are otherwise within normal limits. Stomach/Bowel: There is diffuse colonic wall thickening most significant in the ascending colon. There is also diffuse wall thickening and inflammation of the entire duodenum. Appendix is not definitely seen. There is no bowel obstruction, pneumatosis or free air. There is wall thickening of the distal esophagus. Vascular/Lymphatic: Aorta and IVC are normal in size. No enlarged lymph nodes are seen. Left-sided varicocele and splenic varices as well as gastric varices are again noted. Reproductive: Prostate gland is mildly enlarged. Other: There is small volume ascites. Musculoskeletal: Degenerative changes affect the spine. IMPRESSION: 1. Cholelithiasis with distended gallbladder and mild inflammatory stranding  surrounding the gallbladder. Findings are concerning for acute cholecystitis. 2. Diffuse colonic wall thickening most significant in the ascending colon worrisome for colitis. 3. Diffuse wall thickening and inflammation of the duodenum compatible with duodenitis. 4. Cirrhosis with sequela of portal hypertension including splenomegaly, varices, and small volume ascites. 5. Wall thickening of the distal esophagus worrisome for esophagitis. Electronically Signed   By: Greig Pique M.D.   On: 01/26/2024 23:08    Anti-infectives: Anti-infectives (From admission, onward)    Start     Dose/Rate Route Frequency Ordered Stop   01/28/24 1400  meropenem  (MERREM ) 1 g in sodium chloride  0.9 % 100 mL IVPB        1 g 200 mL/hr over 30 Minutes Intravenous Every 8 hours 01/28/24 0915     01/27/24 0830  ceFEPIme  (MAXIPIME ) 2 g in sodium chloride  0.9 % 100 mL IVPB  Status:  Discontinued        2 g 200 mL/hr over 30 Minutes Intravenous Every 8 hours 01/27/24 0743 01/28/24 0915   01/27/24 0700  metroNIDAZOLE  (FLAGYL ) IVPB 500 mg  Status:  Discontinued        500 mg 100 mL/hr over 60 Minutes Intravenous Every 12 hours 01/27/24 0654 01/28/24 0915   01/26/24 2330  ceFEPIme  (MAXIPIME ) 2 g in sodium chloride  0.9 % 100 mL IVPB        2 g 200 mL/hr over 30 Minutes Intravenous  Once 01/26/24 2316 01/27/24 0043   01/26/24 2330  metroNIDAZOLE  (FLAGYL ) IVPB 500 mg        500 mg 100 mL/hr over 60 Minutes Intravenous  Once 01/26/24 2316 01/27/24 0043       Assessment/Plan: Patient with acute cholecystitis in the setting of cirrhosis/portal HTN from NASH.  PRN for pain Soft diet, avoid fatty foods,  go slow LFTs and labs Merrem  for acute cholecystitis  Discussed with team.    LOS: 1 day    Stephen Burch 01/28/2024

## 2024-01-28 NOTE — Progress Notes (Signed)
 Subjective: Reports he is feeling ok overall. No abdominal pain. He has had a fever today. No nausea or vomiting. Also notes black stool starting yesterday with a total of 3 episodes.  States he does take oral iron at home every day, but has not taken since Friday.  Denies NSAIDs.  Objective: Vital signs in last 24 hours: Temp:  [99.2 F (37.3 C)-101.3 F (38.5 C)] 100.1 F (37.8 C) (07/29 1056) Pulse Rate:  [79-90] 80 (07/29 1056) Resp:  [11-20] 18 (07/28 1900) BP: (91-155)/(53-82) 134/74 (07/29 1056) SpO2:  [91 %-100 %] 98 % (07/29 1056) Last BM Date : 01/28/24 General:   Alert and oriented, pleasant Head:  Normocephalic and atraumatic. Eyes:  No icterus, sclera clear. Conjuctiva pink.  Mouth:  Without lesions, mucosa pink and moist.  Neck:  Supple, without thyromegaly or masses.  Heart:  S1, S2 present, no murmurs noted.  Lungs: Clear to auscultation bilaterally, without wheezing, rales, or rhonchi.  Abdomen:  Bowel sounds present, soft, non-tender, non-distended. No HSM or hernias noted. No rebound or guarding. No masses appreciated  Msk:  Symmetrical without gross deformities. Normal posture. Pulses:  Normal pulses noted. Extremities:  Without clubbing or edema. Neurologic:  Alert and  oriented x4;  grossly normal neurologically. Skin:  Warm and dry, intact without significant lesions.  Cervical Nodes:  No significant cervical adenopathy. Psych:  Alert and cooperative. Normal mood and affect.  Intake/Output from previous day: 07/28 0701 - 07/29 0700 In: 482.6 [I.V.:304.5; IV Piggyback:178.1] Out: -  Intake/Output this shift: Total I/O In: 240 [P.O.:240] Out: -   Lab Results: Recent Labs    01/26/24 2139 01/27/24 0723 01/28/24 0418  WBC 3.3* 4.8 5.6  HGB 13.0 13.1 12.3*  HCT 38.3* 37.9* 35.1*  PLT 71* 66* 63*   BMET Recent Labs    01/26/24 2139 01/27/24 0723 01/28/24 0418  NA 139 136 134*  K 3.7 3.9 3.7  CL 102 104 105  CO2 24 26 24   GLUCOSE  192* 165* 122*  BUN 15 15 16   CREATININE 0.85 0.77 0.60*  CALCIUM 8.5* 8.2* 7.9*   LFT Recent Labs    01/26/24 2139 01/27/24 0723 01/28/24 0418  PROT 6.8 6.3* 5.8*  ALBUMIN 3.6 3.2* 2.8*  AST 40 42* 32  ALT 40 41 36  ALKPHOS 191* 179* 155*  BILITOT 3.9* 4.8* 9.1*    Studies/Results: NM Hepatobiliary Liver Func Result Date: 01/27/2024 CLINICAL DATA:  Right upper quadrant pain EXAM: NUCLEAR MEDICINE HEPATOBILIARY IMAGING TECHNIQUE: Sequential images of the abdomen were obtained out to 60 minutes following intravenous administration of radiopharmaceutical. Additional imaging for 30 minutes was obtained after the administration of 2 mg of IV morphine  x1. RADIOPHARMACEUTICALS:  5.2 mCi Tc-5m  Choletec  IV COMPARISON:  Ultrasound 01/19/2024.  CT 01/26/2024. FINDINGS: Prompt uptake and biliary excretion of activity by the liver is seen. Biliary activity passes into small bowel, consistent with patent common bile duct. Initially gallbladder activity is not seen. After administration of morphine  the gallbladder is still non visible. IMPRESSION: No common duct obstruction. However even despite the administration of IV morphine , the gallbladder is not seen. This has a differential including acute cholecystitis. Please correlate with clinical presentation. Electronically Signed   By: Ranell Bring M.D.   On: 01/27/2024 14:45   US  Abdomen Limited RUQ (LIVER/GB) Result Date: 01/27/2024 CLINICAL DATA:  Right upper quadrant abdominal pain for proximally 12 hours. EXAM: ULTRASOUND ABDOMEN LIMITED RIGHT UPPER QUADRANT COMPARISON:  Abdomen and pelvis CT dated 01/26/2024  and 11/03/2023. Right upper quadrant abdomen ultrasound dated 11/03/2023. FINDINGS: Gallbladder: Dilated with indistinct wall thickening measuring up to 7 mm in thickness. No pericholecystic fluid. Sludge in the gallbladder. Multiple stones seen in the gallbladder neck and cystic duct on the CTs dated 01/26/2024 and 11/03/2023 are not visualized  sonographically. This area is obscured by overlying bowel-gas. Positive sonographic Murphy sign. Common bile duct: Diameter: 4.1 mm Liver: Changes of cirrhosis with extensive nodular contours. Portal vein is poorly visualized on color Doppler imaging, patent on the CT obtained yesterday. The direction of flow was difficult to establish on today's exam. Other: None. IMPRESSION: 1. Findings suspicious for acute cholecystitis including multiple stones in the gallbladder neck and cystic duct on the recent CT, large enough to cause cystic duct obstruction; positive sonographic Murphy sign, diffuse gallbladder wall thickening, gallbladder dilatation and sludge in the gallbladder. 2. Changes of cirrhosis of the liver. 3. Poorly visualized portal vein on color Doppler, making direction of flow difficult to determine. Electronically Signed   By: Elspeth Bathe M.D.   On: 01/27/2024 10:41   CT ABDOMEN PELVIS W CONTRAST Result Date: 01/26/2024 CLINICAL DATA:  Right upper quadrant EXAM: CT ABDOMEN AND PELVIS WITH CONTRAST TECHNIQUE: Multidetector CT imaging of the abdomen and pelvis was performed using the standard protocol following bolus administration of intravenous contrast. RADIATION DOSE REDUCTION: This exam was performed according to the departmental dose-optimization program which includes automated exposure control, adjustment of the mA and/or kV according to patient size and/or use of iterative reconstruction technique. CONTRAST:  100mL OMNIPAQUE  IOHEXOL  300 MG/ML  SOLN COMPARISON:  MRI of the abdomen 12/16/2023. FINDINGS: Lower chest: No acute abnormality. Hepatobiliary: Nodular liver contour is again seen compatible with cirrhosis. Gallstones are present. The gallbladder is distended there is mild inflammatory stranding surrounding the gallbladder. No biliary ductal dilatation identified. Pancreas: Unremarkable. No pancreatic ductal dilatation or surrounding inflammatory changes. Spleen: The spleen is enlarged,  unchanged. Adrenals/Urinary Tract: Rounded hypodensities in the kidneys are too small to characterize, likely cyst. The adrenal glands, kidneys and bladder are otherwise within normal limits. Stomach/Bowel: There is diffuse colonic wall thickening most significant in the ascending colon. There is also diffuse wall thickening and inflammation of the entire duodenum. Appendix is not definitely seen. There is no bowel obstruction, pneumatosis or free air. There is wall thickening of the distal esophagus. Vascular/Lymphatic: Aorta and IVC are normal in size. No enlarged lymph nodes are seen. Left-sided varicocele and splenic varices as well as gastric varices are again noted. Reproductive: Prostate gland is mildly enlarged. Other: There is small volume ascites. Musculoskeletal: Degenerative changes affect the spine. IMPRESSION: 1. Cholelithiasis with distended gallbladder and mild inflammatory stranding surrounding the gallbladder. Findings are concerning for acute cholecystitis. 2. Diffuse colonic wall thickening most significant in the ascending colon worrisome for colitis. 3. Diffuse wall thickening and inflammation of the duodenum compatible with duodenitis. 4. Cirrhosis with sequela of portal hypertension including splenomegaly, varices, and small volume ascites. 5. Wall thickening of the distal esophagus worrisome for esophagitis. Electronically Signed   By: Greig Pique M.D.   On: 01/26/2024 23:08    Assessment: 68 y.o. 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 Kallie in May with recommendations to be seen by tertiary care  if cholecystectomy, liver lesion s/p MR liver without suspicious findings, presenting this  admission with acute cholecystitis, and GI has been requested to follow along in light of cirrhosis history and management if any decompensation.   Cholecystitis:  Dr. Kallie has evaluated patient with plans for conservative management and antibiotic therapy, cholecystostomy tube placement by IR at [facility] if no improvement. He did develop fever over night, up to 101.3, which has come down to 100.1. Antibiotics are being changed from cefepime  to meropenem . Encouragingly his abdominal pain has improved.  Last dose of pain medication around 3 AM this morning and reporting no abdominal pain at the time of my consultation around 12 pm.   MASH Cirrhosis:  MELD 3.0 was 17 in May 2025.   Cirrhosis has been complicated by known esophageal and gastric varices and has been intolerant to NSBB in past, declined updated EGD, and declined TIPS evaluation. He is reporting new onset black stool starting yesterday with a total of 3 episodes.  Reports last dose of oral iron was Friday.  Clinically, he is not presenting as someone with a variceal bleed.  Hemoglobin did decline from 13.1 yesterday to 12.3 today, but he has remained hemodynamically stable.  He did receive IV fluids yesterday and has been receiving IV antibiotics which may have accounted for some of decline in his hemoglobin.  We will however go ahead and start him on IV PPI twice daily, recheck H&H, and INR.  Holding off on octreotide for now as presentation is not felt to be a variceal bleed; however, will need to follow this closely.  Otherwise, he is doing well without HE, LE edema, or concerns for SBP.   GI will continue to follow during hospitalization and recommend tertiary evaluation expeditiously as outpatient. If decompensation, can start process for transfer to tertiary care.   Plan: Recheck H/H and check INR as this hasn't been checked this admission.  Start IV Protonix  40 mg BID Daily monitoring of CBC, CMP, and INR Monitor for overt GI  bleeding or development of hepatic encephalopathy. If patient becomes decompensated, will need to reach out to tertiary center [facility]) for transfer. Continue IV antibiotics. Continue supportive measures. Pursue IR for placement of cholecystostomy tube if no improvement. Outpatient expedited evaluation at [facility].    LOS: 1 day    01/28/2024, 1:01 PM   [facility], [facility]

## 2024-01-29 ENCOUNTER — Telehealth (INDEPENDENT_AMBULATORY_CARE_PROVIDER_SITE_OTHER): Payer: Self-pay | Admitting: Gastroenterology

## 2024-01-29 DIAGNOSIS — I8500 Esophageal varices without bleeding: Secondary | ICD-10-CM | POA: Diagnosis not present

## 2024-01-29 DIAGNOSIS — I8510 Secondary esophageal varices without bleeding: Secondary | ICD-10-CM

## 2024-01-29 DIAGNOSIS — K7581 Nonalcoholic steatohepatitis (NASH): Secondary | ICD-10-CM | POA: Diagnosis not present

## 2024-01-29 DIAGNOSIS — K810 Acute cholecystitis: Secondary | ICD-10-CM | POA: Diagnosis not present

## 2024-01-29 DIAGNOSIS — D696 Thrombocytopenia, unspecified: Secondary | ICD-10-CM | POA: Diagnosis not present

## 2024-01-29 DIAGNOSIS — Z8679 Personal history of other diseases of the circulatory system: Secondary | ICD-10-CM | POA: Diagnosis not present

## 2024-01-29 DIAGNOSIS — K766 Portal hypertension: Secondary | ICD-10-CM | POA: Diagnosis not present

## 2024-01-29 LAB — CBC
HCT: 32.5 % — ABNORMAL LOW (ref 39.0–52.0)
Hemoglobin: 11.5 g/dL — ABNORMAL LOW (ref 13.0–17.0)
MCH: 32.8 pg (ref 26.0–34.0)
MCHC: 35.4 g/dL (ref 30.0–36.0)
MCV: 92.6 fL (ref 80.0–100.0)
Platelets: 69 K/uL — ABNORMAL LOW (ref 150–400)
RBC: 3.51 MIL/uL — ABNORMAL LOW (ref 4.22–5.81)
RDW: 13.2 % (ref 11.5–15.5)
WBC: 5 K/uL (ref 4.0–10.5)
nRBC: 0 % (ref 0.0–0.2)

## 2024-01-29 LAB — COMPREHENSIVE METABOLIC PANEL WITH GFR
ALT: 26 U/L (ref 0–44)
AST: 22 U/L (ref 15–41)
Albumin: 2.7 g/dL — ABNORMAL LOW (ref 3.5–5.0)
Alkaline Phosphatase: 140 U/L — ABNORMAL HIGH (ref 38–126)
Anion gap: 8 (ref 5–15)
BUN: 14 mg/dL (ref 8–23)
CO2: 24 mmol/L (ref 22–32)
Calcium: 8.2 mg/dL — ABNORMAL LOW (ref 8.9–10.3)
Chloride: 104 mmol/L (ref 98–111)
Creatinine, Ser: 0.7 mg/dL (ref 0.61–1.24)
GFR, Estimated: >60 mL/min (ref >60)
Glucose, Bld: 112 mg/dL — ABNORMAL HIGH (ref 70–99)
Potassium: 3.5 mmol/L (ref 3.5–5.1)
Sodium: 136 mmol/L (ref 135–145)
Total Bilirubin: 4.5 mg/dL — ABNORMAL HIGH (ref 0.0–1.2)
Total Protein: 5.7 g/dL — ABNORMAL LOW (ref 6.5–8.1)

## 2024-01-29 LAB — GLUCOSE, CAPILLARY
Glucose-Capillary: 129 mg/dL — ABNORMAL HIGH (ref 70–99)
Glucose-Capillary: 166 mg/dL — ABNORMAL HIGH (ref 70–99)
Glucose-Capillary: 207 mg/dL — ABNORMAL HIGH (ref 70–99)

## 2024-01-29 LAB — PROTIME-INR
INR: 1.4 — ABNORMAL HIGH (ref 0.8–1.2)
Prothrombin Time: 18.3 s — ABNORMAL HIGH (ref 11.4–15.2)

## 2024-01-29 MED ORDER — SPIRONOLACTONE 100 MG PO TABS
100.0000 mg | ORAL_TABLET | Freq: Every day | ORAL | Status: DC
  Administered 2024-01-29 – 2024-01-30 (×2): 100 mg via ORAL
  Filled 2024-01-29 (×2): qty 1

## 2024-01-29 MED ORDER — FUROSEMIDE 40 MG PO TABS
40.0000 mg | ORAL_TABLET | Freq: Every day | ORAL | Status: DC
  Administered 2024-01-29 – 2024-01-30 (×2): 40 mg via ORAL
  Filled 2024-01-29 (×2): qty 1

## 2024-01-29 NOTE — Progress Notes (Signed)
 PROGRESS NOTE     Stephen Burch, is a 68 y.o. male, DOB - [birth year], FMW:[id number]  Admit date - 01/26/2024   Admitting Physician Tully FORBES Carwin, MD  Outpatient Primary MD for the patient is Shona Norleen PEDLAR, MD  LOS - 2  Chief Complaint  Patient presents with   Abdominal Pain        Brief Narrative:   68 y.o. male with medical history significant for systolic CHF, liver cirrhosis, cardiac arrest 2017, esophageal varices, atrial fibrillation, hypothyroidism, diabetes mellitus admitted on 01/27/2024 with acute cholecystitis setting of liver cirrhosis    -Assessment and Plan: 1)Acute cholecystitis CT abdomen and pelvis with contrast and  RUQ US --are consistent with acute cholecystitis  -Bilirubin up but hida had shown no obstruction of the CBD. -If Bilirubin continues to trend up, may need MRCP - General surgeon Dr. Kallie was consulted-initial plan was to transfer patient to tertiary care center due to patient's comorbidities of liver cirrhosis and portal hypertension.  Unfortunately [facility] is at capacity, and [facility] -would do essentially what can do here.   -- Discussed with general surgeon Dr. Kallie and GI team - Try to avoid lap chole as this may precipitate liver failure that may necessitate transplant- -if lap chole needed patient will have to be added liver transplant [facility] facility -Continue conservative management and IV antibiotics. -- Consider IR cholecystostomy tube placement if patient failed conservative management. - Continue as needed opiates and antiemetics -Continue Protonix  for possible reactive duodenitis and in order to provide stress ulcer prevention  2)History of atrial fibrillation Not on anticoagulation or rate limiting meds.  Chronic thrombocytopenia. - Continue telemetry monitoring and outpatient follow-up with cardiology service.  3)T2DM (type 2 diabetes mellitus) (HCC) Controlled.  A1c 5.5- 10/2023--reflecting excellent diabetic control PTA -  Hold glipizide  especially since oral intake is unreliable due to #1 above Use Novolog /Humalog Sliding scale insulin  with Accu-Cheks/Fingersticks as ordered   4)Liver cirrhosis secondary to NASH (HCC) Appears stable and compensated.   -Chronic thrombocytopenia over the past 2 years platelets have ranged from 65-99. - No encephalopathy - Blood pressure has further stabilized - Resuming adjusted dose of diuretics as per GI service recommendation.  5) acute on chronic anemia--history of chronic anemia - Hgb was 13.1 on admission probably due to hemoconcentration - Hgb trended down with hydration but still above 12 - Patient reports dark stools but he has been on iron therapy at home - Protonix  as above in # 1 - Monitor H&H closely especially given thrombocytopenia with increased risk for bleeding  Status is: Inpatient  Disposition: The patient is from: Home              Anticipated d/c is to: Home              Anticipated d/c date is: > 3 days              Patient currently is not medically stable to d/c. Barriers: Not Clinically Stable-   Code Status : -  Code Status: Full Code   Family Communication:    NA (patient is alert, awake and coherent) None at bedside DVT Prophylaxis  :   - SCDs  SCDs Start: 01/27/24 1655   Lab Results  Component Value Date   PLT 69 (L) 01/29/2024    Inpatient Medications  Scheduled Meds:  furosemide   40 mg Oral Daily   insulin  aspart  0-9 Units Subcutaneous Q6H   pantoprazole  (PROTONIX ) IV  40 mg Intravenous Q12H  spironolactone   100 mg Oral Daily   Continuous Infusions:  meropenem  (MERREM ) IV 1 g (01/29/24 1322)   PRN Meds:.HYDROmorphone  (DILAUDID ) injection, HYDROmorphone  (DILAUDID ) injection, ondansetron  (ZOFRAN ) IV, mouth rinse, polyethylene glycol   Anti-infectives (From admission, onward)    Start     Dose/Rate Route Frequency Ordered Stop   01/28/24 1400  meropenem  (MERREM ) 1 g in sodium chloride  0.9 % 100 mL IVPB        1 g 200  mL/hr over 30 Minutes Intravenous Every 8 hours 01/28/24 0915     01/27/24 0830  ceFEPIme  (MAXIPIME ) 2 g in sodium chloride  0.9 % 100 mL IVPB  Status:  Discontinued        2 g 200 mL/hr over 30 Minutes Intravenous Every 8 hours 01/27/24 0743 01/28/24 0915   01/27/24 0700  metroNIDAZOLE  (FLAGYL ) IVPB 500 mg  Status:  Discontinued        500 mg 100 mL/hr over 60 Minutes Intravenous Every 12 hours 01/27/24 0654 01/28/24 0915   01/26/24 2330  ceFEPIme  (MAXIPIME ) 2 g in sodium chloride  0.9 % 100 mL IVPB        2 g 200 mL/hr over 30 Minutes Intravenous  Once 01/26/24 2316 01/27/24 0043   01/26/24 2330  metroNIDAZOLE  (FLAGYL ) IVPB 500 mg        500 mg 100 mL/hr over 60 Minutes Intravenous  Once 01/26/24 2316 01/27/24 0043       Subjective: Stephen Burch reports feeling better; no nausea, no vomiting, no chest pain or shortness of breath.  Patient is afebrile and demonstrating improvement in his LFTs and WBCs levels.  Objective: Vitals:   01/28/24 1320 01/28/24 2057 01/29/24 0519 01/29/24 1418  BP: (!) 161/71 131/60 136/68 129/69  Pulse: 78 73 78 82  Resp:  20 (!) 21 19  Temp: 100.3 F (37.9 C) 98.9 F (37.2 C) 98.5 F (36.9 C) 98.4 F (36.9 C)  TempSrc: Oral Oral Oral   SpO2: 100% 98% 98% 96%  Weight:      Height:        Intake/Output Summary (Last 24 hours) at 01/29/2024 1852 Last data filed at 01/29/2024 1700 Gross per 24 hour  Intake 1140 ml  Output --  Net 1140 ml   Filed Weights   01/26/24 2114  Weight: 91 kg    Physical Exam General exam: Alert, awake, oriented x 3; no chest pain, no nausea, no vomiting.  Reports feeling better.  Afebrile Respiratory system: Good saturation on room air. Cardiovascular system:RRR.  No rubs or gallops. Gastrointestinal system: Abdomen is mildly distended, positive ascites appreciated.  Tender with deep palpation in the right upper quadrant.  No guarding.  Positive bowel sounds. Central nervous system: Alert and oriented. No focal  neurological deficits. Extremities: No cyanosis or clubbing. Skin: No petechiae. Psychiatry: Judgement and insight appear normal. Mood & affect appropriate.   Data Reviewed: I have personally reviewed following labs and imaging studies  CBC: Recent Labs  Lab 01/23/24 0934 01/26/24 2139 01/27/24 0723 01/28/24 0418 01/28/24 1331 01/29/24 0454  WBC 2.7* 3.3* 4.8 5.6  --  5.0  NEUTROABS 2.0  --  4.1  --   --   --   HGB 13.2 13.0 13.1 12.3* 12.7* 11.5*  HCT 38.0* 38.3* 37.9* 35.1* 37.3* 32.5*  MCV 92.0 93.4 93.1 93.1  --  92.6  PLT 65* 71* 66* 63*  --  69*   Basic Metabolic Panel: Recent Labs  Lab 01/26/24 2139 01/27/24 0723 01/28/24 0418 01/29/24 0454  NA 139 136 134* 136  K 3.7 3.9 3.7 3.5  CL 102 104 105 104  CO2 24 26 24 24   GLUCOSE 192* 165* 122* 112*  BUN 15 15 16 14   CREATININE 0.85 0.77 0.60* 0.70  CALCIUM 8.5* 8.2* 7.9* 8.2*   GFR: Estimated Creatinine Clearance: 98.9 mL/min (by C-G formula based on SCr of 0.7 mg/dL).  Liver Function Tests: Recent Labs  Lab 01/26/24 2139 01/27/24 0723 01/28/24 0418 01/29/24 0454  AST 40 42* 32 22  ALT 40 41 36 26  ALKPHOS 191* 179* 155* 140*  BILITOT 3.9* 4.8* 9.1* 4.5*  PROT 6.8 6.3* 5.8* 5.7*  ALBUMIN 3.6 3.2* 2.8* 2.7*     Radiology Studies: No results found.    Scheduled Meds:  furosemide   40 mg Oral Daily   insulin  aspart  0-9 Units Subcutaneous Q6H   pantoprazole  (PROTONIX ) IV  40 mg Intravenous Q12H   spironolactone   100 mg Oral Daily   Continuous Infusions:  meropenem  (MERREM ) IV 1 g (01/29/24 1322)     LOS: 2 days    Eric Nunnery M.D on 01/29/2024 at 6:52 PM  Go to [email or web address] - for contact info  [facility] - Office  [phone]  If 7PM-7AM, please contact night-coverage [email or web address] 01/29/2024, 6:52 PM

## 2024-01-29 NOTE — Progress Notes (Signed)
 [facility] Progress Note     Subjective: Pain doing better. LFTs improving. Says his fullness is improving. Having Bms.   Objective: Vital signs in last 24 hours: Temp:  [98.5 F (36.9 C)-100.3 F (37.9 C)] 98.5 F (36.9 C) (07/30 0519) Pulse Rate:  [73-80] 78 (07/30 0519) Resp:  [20-21] 21 (07/30 0519) BP: (131-161)/(60-74) 136/68 (07/30 0519) SpO2:  [98 %-100 %] 98 % (07/30 0519) Last BM Date : 01/27/24  Intake/Output from previous day: 07/29 0701 - 07/30 0700 In: 580 [P.O.:480; IV Piggyback:100] Out: -  Intake/Output this shift: Total I/O In: 300 [P.O.:300] Out: -   General appearance: alert and no distress GI: soft, mildly distended, mild RUQ tenderness   Lab Results:  Recent Labs    01/28/24 0418 01/28/24 1331 01/29/24 0454  WBC 5.6  --  5.0  HGB 12.3* 12.7* 11.5*  HCT 35.1* 37.3* 32.5*  PLT 63*  --  69*   BMET Recent Labs    01/28/24 0418 01/29/24 0454  NA 134* 136  K 3.7 3.5  CL 105 104  CO2 24 24  GLUCOSE 122* 112*  BUN 16 14  CREATININE 0.60* 0.70  CALCIUM 7.9* 8.2*   PT/INR Recent Labs    01/28/24 1331 01/29/24 0454  LABPROT 18.7* 18.3*  INR 1.5* 1.4*    Studies/Results: NM Hepatobiliary Liver Func Result Date: 01/27/2024 CLINICAL DATA:  Right upper quadrant pain EXAM: NUCLEAR MEDICINE HEPATOBILIARY IMAGING TECHNIQUE: Sequential images of the abdomen were obtained out to 60 minutes following intravenous administration of radiopharmaceutical. Additional imaging for 30 minutes was obtained after the administration of 2 mg of IV morphine  x1. RADIOPHARMACEUTICALS:  5.2 mCi Tc-36m  Choletec  IV COMPARISON:  Ultrasound 01/19/2024.  CT 01/26/2024. FINDINGS: Prompt uptake and biliary excretion of activity by the liver is seen. Biliary activity passes into small bowel, consistent with patent common bile duct. Initially gallbladder activity is not seen. After administration of morphine  the gallbladder is still non visible.  IMPRESSION: No common duct obstruction. However even despite the administration of IV morphine , the gallbladder is not seen. This has a differential including acute cholecystitis. Please correlate with clinical presentation. Electronically Signed   By: Ranell Bring M.D.   On: 01/27/2024 14:45    Anti-infectives: Anti-infectives (From admission, onward)    Start     Dose/Rate Route Frequency Ordered Stop   01/28/24 1400  meropenem  (MERREM ) 1 g in sodium chloride  0.9 % 100 mL IVPB        1 g 200 mL/hr over 30 Minutes Intravenous Every 8 hours 01/28/24 0915     01/27/24 0830  ceFEPIme  (MAXIPIME ) 2 g in sodium chloride  0.9 % 100 mL IVPB  Status:  Discontinued        2 g 200 mL/hr over 30 Minutes Intravenous Every 8 hours 01/27/24 0743 01/28/24 0915   01/27/24 0700  metroNIDAZOLE  (FLAGYL ) IVPB 500 mg  Status:  Discontinued        500 mg 100 mL/hr over 60 Minutes Intravenous Every 12 hours 01/27/24 0654 01/28/24 0915   01/26/24 2330  ceFEPIme  (MAXIPIME ) 2 g in sodium chloride  0.9 % 100 mL IVPB        2 g 200 mL/hr over 30 Minutes Intravenous  Once 01/26/24 2316 01/27/24 0043   01/26/24 2330  metroNIDAZOLE  (FLAGYL ) IVPB 500 mg        500 mg 100 mL/hr over 60 Minutes Intravenous  Once 01/26/24 2316 01/27/24 0043       Assessment/Plan: Patient with  acute cholecystitis confirmed on HIDA in the setting of cirrhosis/ portal hypertension for NASH.  Diet as tolerated, avoid fatty foods Continue antibiotics, discussing with team and pharmacy plan for the oral options GI following, getting outpatient appt for [facility]  HIDA and US  sent to [facility], CT and MRI already sent over   Will continue to follow, discussed if he goes home and worsens he needs to get to [facility] Trying avoid IR drain right now so that it is not permanent in him   LOS: 2 days    Manuelita JAYSON Pander 01/29/2024

## 2024-01-29 NOTE — Progress Notes (Addendum)
 Subjective: Patient reports more abdominal bloating today, though he has not received his diuretics this admission. No real abdominal pain today. No nausea or vomiting. Had some mild abdominal discomfort last night. He has had about 3 BMs since last night that were looser, reports stools last night were darker to green in color.  No BRBPR.  No swelling to LEs. No episodes of confusion.   Objective: Vital signs in last 24 hours: Temp:  [98.5 F (36.9 C)-100.3 F (37.9 C)] 98.5 F (36.9 C) (07/30 0519) Pulse Rate:  [73-80] 78 (07/30 0519) Resp:  [20-21] 21 (07/30 0519) BP: (131-161)/(60-74) 136/68 (07/30 0519) SpO2:  [98 %-100 %] 98 % (07/30 0519) Last BM Date : 01/28/24 General:   Alert and oriented, pleasant Head:  Normocephalic and atraumatic. Eyes:  No icterus, sclera clear. Conjuctiva pink.  Mouth:  Without lesions, mucosa pink and moist.  Heart:  S1, S2 present, no murmurs noted.  Lungs: Clear to auscultation bilaterally, without wheezing, rales, or rhonchi.  Abdomen:  Bowel sounds present, abdomen is full/distended but non taut. No HSM or hernias noted. No rebound or guarding. No masses appreciated  Msk:  Symmetrical without gross deformities. Normal posture. Pulses:  Normal pulses noted. Extremities:  Without clubbing or edema. Neurologic:  Alert and  oriented x4;  grossly normal neurologically. No asterixis.  Skin:  Warm and dry, intact without significant lesions.  Psych:  Alert and cooperative. Normal mood and affect.  Intake/Output from previous day: 07/29 0701 - 07/30 0700 In: 580 [P.O.:480; IV Piggyback:100] Out: -  Intake/Output this shift: No intake/output data recorded.  Lab Results: Recent Labs    01/27/24 0723 01/28/24 0418 01/28/24 1331 01/29/24 0454  WBC 4.8 5.6  --  5.0  HGB 13.1 12.3* 12.7* 11.5*  HCT 37.9* 35.1* 37.3* 32.5*  PLT 66* 63*  --  69*   BMET Recent Labs    01/27/24 0723 01/28/24 0418 01/29/24 0454  NA 136 134* 136  K 3.9 3.7 3.5   CL 104 105 104  CO2 26 24 24   GLUCOSE 165* 122* 112*  BUN 15 16 14   CREATININE 0.77 0.60* 0.70  CALCIUM 8.2* 7.9* 8.2*   LFT Recent Labs    01/27/24 0723 01/28/24 0418 01/29/24 0454  PROT 6.3* 5.8* 5.7*  ALBUMIN 3.2* 2.8* 2.7*  AST 42* 32 22  ALT 41 36 26  ALKPHOS 179* 155* 140*  BILITOT 4.8* 9.1* 4.5*   PT/INR Recent Labs    01/28/24 1331 01/29/24 0454  LABPROT 18.7* 18.3*  INR 1.5* 1.4*   Studies/Results: NM Hepatobiliary Liver Func Result Date: 01/27/2024 CLINICAL DATA:  Right upper quadrant pain EXAM: NUCLEAR MEDICINE HEPATOBILIARY IMAGING TECHNIQUE: Sequential images of the abdomen were obtained out to 60 minutes following intravenous administration of radiopharmaceutical. Additional imaging for 30 minutes was obtained after the administration of 2 mg of IV morphine  x1. RADIOPHARMACEUTICALS:  5.2 mCi Tc-46m  Choletec  IV COMPARISON:  Ultrasound 01/19/2024.  CT 01/26/2024. FINDINGS: Prompt uptake and biliary excretion of activity by the liver is seen. Biliary activity passes into small bowel, consistent with patent common bile duct. Initially gallbladder activity is not seen. After administration of morphine  the gallbladder is still non visible. IMPRESSION: No common duct obstruction. However even despite the administration of IV morphine , the gallbladder is not seen. This has a differential including acute cholecystitis. Please correlate with clinical presentation. Electronically Signed   By: Ranell Bring M.D.   On: 01/27/2024 14:45   US  Abdomen Limited RUQ (LIVER/GB) Result  Date: 01/27/2024 CLINICAL DATA:  Right upper quadrant abdominal pain for proximally 12 hours. EXAM: ULTRASOUND ABDOMEN LIMITED RIGHT UPPER QUADRANT COMPARISON:  Abdomen and pelvis CT dated 01/26/2024 and 11/03/2023. Right upper quadrant abdomen ultrasound dated 11/03/2023. FINDINGS: Gallbladder: Dilated with indistinct wall thickening measuring up to 7 mm in thickness. No pericholecystic fluid. Sludge in  the gallbladder. Multiple stones seen in the gallbladder neck and cystic duct on the CTs dated 01/26/2024 and 11/03/2023 are not visualized sonographically. This area is obscured by overlying bowel-gas. Positive sonographic Murphy sign. Common bile duct: Diameter: 4.1 mm Liver: Changes of cirrhosis with extensive nodular contours. Portal vein is poorly visualized on color Doppler imaging, patent on the CT obtained yesterday. The direction of flow was difficult to establish on today's exam. Other: None. IMPRESSION: 1. Findings suspicious for acute cholecystitis including multiple stones in the gallbladder neck and cystic duct on the recent CT, large enough to cause cystic duct obstruction; positive sonographic Murphy sign, diffuse gallbladder wall thickening, gallbladder dilatation and sludge in the gallbladder. 2. Changes of cirrhosis of the liver. 3. Poorly visualized portal vein on color Doppler, making direction of flow difficult to determine. Electronically Signed   By: Elspeth Bathe M.D.   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 Kallie in May with recommendations to be seen by tertiary care if cholecystectomy warranted, liver lesion s/p MR liver without suspicious findings, presenting this admission with acute cholecystitis.  GI has been requested to follow along in light of cirrhosis history and management if any decompensation.    Cholecystitis: -Evaluated by Dr. Kallie with plans for conservative management and antibiotic therapy -recommended to Consider cholecystostomy tube placement by IR at [facility] if no improvement while inpatient -Became febrile overnight Monday night up to  101.3 though afebrile today -Antibiotics were changed yesterday from cefepime  to meropenem  -Abdominal pain improving, no nausea or vomiting -plans for augmentin vs. Cefpodoxime /flagyl  as outpatient, our team has recommended avoiding augmentin in setting of cirrhosis if possible    MASH Cirrhosis: MELD 3.0 18 (influenced by elevated T bili in setting of cholecystitis)  - Cirrhosis complicated by known esophageal and gastric varices - Intolerant to nonselective beta-blocker in the past, declined updated EGD and TIPS evaluation despite multiple discussions - Reported new onset darker/black?green stools starting Monday x 3 - Last dose of oral iron on Friday - Low suspicion for variceal bleed given clinical presentation - Hemoglobin did decline to 13.1 on Monday down to 12.3 yesterday, 11.5 today in setting of IV fluids and IV antibiotics - Holding off octreotide for now as this is not felt to be a variceal bleed -some ascites present on exam today, diuretics have been held this admission, will restart these today -No signs of HE, lower extremity edema or concern for SBP at this time  -Recommend tertiary evaluation, will send referral to [facility] so he can get linked up with them regarding possible cholecystectomy/potential transplant evaluation  -diuretics on hold due to soft BP yesterday, BP improved today. Creatnine and electrolytes stable, in light of ascites, recommend resuming lasix /spironolactone , can uptitrate to his home dose pending clinical course   Plan: - Trend H&H, monitor for overt GI bleeding - MELD labs daily -recommend restart  diuretics, lasix  40mg  and spironolactone  100mg  while inpatient (can resume outpatient dosing via uptitration pending clinical course) - PPI twice daily - Monitor for signs of hepatic encephalopathy - Will need tertiary center transfer if decompensation occurs during admission, otherwise will refer to [facility] for outpatient evaluation - Continue IV  antibiotics -Continue supportive measures -Pursue IR placement of cholecystostomy tube if symptoms do not improve    LOS: 2 days    01/29/2024, 9:14 AM   Jahaan Vanwagner L. Tala Eber, MSN, APRN, AGNP-C Adult-Gerontology Nurse Practitioner [facility]

## 2024-01-29 NOTE — Plan of Care (Signed)

## 2024-01-30 DIAGNOSIS — Z8679 Personal history of other diseases of the circulatory system: Secondary | ICD-10-CM | POA: Diagnosis not present

## 2024-01-30 DIAGNOSIS — K810 Acute cholecystitis: Secondary | ICD-10-CM | POA: Diagnosis not present

## 2024-01-30 DIAGNOSIS — D696 Thrombocytopenia, unspecified: Secondary | ICD-10-CM | POA: Diagnosis not present

## 2024-01-30 DIAGNOSIS — R188 Other ascites: Secondary | ICD-10-CM

## 2024-01-30 DIAGNOSIS — K7581 Nonalcoholic steatohepatitis (NASH): Secondary | ICD-10-CM | POA: Diagnosis not present

## 2024-01-30 LAB — COMPREHENSIVE METABOLIC PANEL WITH GFR
ALT: 22 U/L (ref 0–44)
AST: 18 U/L (ref 15–41)
Albumin: 2.6 g/dL — ABNORMAL LOW (ref 3.5–5.0)
Alkaline Phosphatase: 138 U/L — ABNORMAL HIGH (ref 38–126)
Anion gap: 8 (ref 5–15)
BUN: 16 mg/dL (ref 8–23)
CO2: 25 mmol/L (ref 22–32)
Calcium: 7.8 mg/dL — ABNORMAL LOW (ref 8.9–10.3)
Chloride: 102 mmol/L (ref 98–111)
Creatinine, Ser: 0.75 mg/dL (ref 0.61–1.24)
GFR, Estimated: >60 mL/min (ref >60)
Glucose, Bld: 135 mg/dL — ABNORMAL HIGH (ref 70–99)
Potassium: 3.5 mmol/L (ref 3.5–5.1)
Sodium: 135 mmol/L (ref 135–145)
Total Bilirubin: 3.3 mg/dL — ABNORMAL HIGH (ref 0.0–1.2)
Total Protein: 5.6 g/dL — ABNORMAL LOW (ref 6.5–8.1)

## 2024-01-30 LAB — CBC
HCT: 32.1 % — ABNORMAL LOW (ref 39.0–52.0)
Hemoglobin: 10.8 g/dL — ABNORMAL LOW (ref 13.0–17.0)
MCH: 31.1 pg (ref 26.0–34.0)
MCHC: 33.6 g/dL (ref 30.0–36.0)
MCV: 92.5 fL (ref 80.0–100.0)
Platelets: 84 K/uL — ABNORMAL LOW (ref 150–400)
RBC: 3.47 MIL/uL — ABNORMAL LOW (ref 4.22–5.81)
RDW: 13 % (ref 11.5–15.5)
WBC: 3.8 K/uL — ABNORMAL LOW (ref 4.0–10.5)
nRBC: 0 % (ref 0.0–0.2)

## 2024-01-30 LAB — PROTIME-INR
INR: 1.2 (ref 0.8–1.2)
Prothrombin Time: 16.1 s — ABNORMAL HIGH (ref 11.4–15.2)

## 2024-01-30 LAB — GLUCOSE, CAPILLARY
Glucose-Capillary: 118 mg/dL — ABNORMAL HIGH (ref 70–99)
Glucose-Capillary: 122 mg/dL — ABNORMAL HIGH (ref 70–99)
Glucose-Capillary: 124 mg/dL — ABNORMAL HIGH (ref 70–99)
Glucose-Capillary: 181 mg/dL — ABNORMAL HIGH (ref 70–99)

## 2024-01-30 MED ORDER — METRONIDAZOLE 500 MG PO TABS
500.0000 mg | ORAL_TABLET | Freq: Three times a day (TID) | ORAL | Status: DC
  Administered 2024-01-30 – 2024-01-31 (×3): 500 mg via ORAL
  Filled 2024-01-30 (×3): qty 1

## 2024-01-30 MED ORDER — PANTOPRAZOLE SODIUM 40 MG PO TBEC
40.0000 mg | DELAYED_RELEASE_TABLET | Freq: Two times a day (BID) | ORAL | Status: DC
  Administered 2024-01-30 – 2024-01-31 (×2): 40 mg via ORAL
  Filled 2024-01-30 (×2): qty 1

## 2024-01-30 MED ORDER — SPIRONOLACTONE 100 MG PO TABS
200.0000 mg | ORAL_TABLET | Freq: Every day | ORAL | Status: DC
  Administered 2024-01-31: 200 mg via ORAL
  Filled 2024-01-30: qty 2

## 2024-01-30 MED ORDER — CEFPODOXIME PROXETIL 100 MG/5ML PO SUSR
200.0000 mg | Freq: Two times a day (BID) | ORAL | Status: DC
  Administered 2024-01-30: 200 mg via ORAL
  Filled 2024-01-30 (×4): qty 10

## 2024-01-30 MED ORDER — FUROSEMIDE 40 MG PO TABS
80.0000 mg | ORAL_TABLET | Freq: Every day | ORAL | Status: DC
  Administered 2024-01-31: 80 mg via ORAL
  Filled 2024-01-30: qty 2

## 2024-01-30 NOTE — Plan of Care (Signed)

## 2024-01-30 NOTE — Telephone Encounter (Signed)
 referral

## 2024-01-30 NOTE — Telephone Encounter (Signed)
 Referral sent, they will contact patient with apt

## 2024-01-30 NOTE — Progress Notes (Signed)
 PROGRESS NOTE     Stephen Burch, is a 68 y.o. male, DOB - 02-Sep-1955, FMW:[id number]  Admit date - 01/26/2024   Admitting Physician Tully FORBES Carwin, MD  Outpatient Primary MD for the patient is Shona Norleen PEDLAR, MD  LOS - 3  Chief Complaint  Patient presents with   Abdominal Pain        Brief Narrative:   68 y.o. male with medical history significant for systolic CHF, liver cirrhosis, cardiac arrest 2017, esophageal varices, atrial fibrillation, hypothyroidism, diabetes mellitus admitted on 01/27/2024 with acute cholecystitis setting of liver cirrhosis    -Assessment and Plan: 1)Acute cholecystitis CT abdomen and pelvis with contrast and  RUQ US --are consistent with acute cholecystitis  - Bilirubin down to 3.3 and alk phos 138; LFTs within normal limits. - At this moment we will transition to oral antibiotics using bank T and Flagyl . - General surgeon Dr. Kallie was consulted-initial plan was to transfer patient to tertiary care center due to patient's comorbidities of liver cirrhosis and portal hypertension.  Unfortunately [facility] is at capacity, and [facility] -would do essentially what can do here.   -- Discussed with general surgeon Dr. Kallie and GI team - Try to avoid lap chole as this may precipitate liver failure that may necessitate transplant- -if lap chole needed patient will have to be added to liver transplant [facility] facility -Continue conservative management and IV antibiotics. -- Consider IR cholecystostomy tube placement if patient failed conservative management. - Continue as needed opiates and antiemetics -Continue Protonix  for possible reactive duodenitis and in order to provide stress ulcer prevention and minimize the chances of esophageal variceal erosion.  2)History of atrial fibrillation Not on anticoagulation or rate limiting meds.  Chronic thrombocytopenia. - Continue telemetry monitoring and outpatient follow-up with cardiology service.  3)T2DM (type 2  diabetes mellitus) (HCC) Controlled.  A1c 5.5- 10/2023--reflecting excellent diabetic control PTA - Hold glipizide  especially since oral intake is unreliable due to #1 above Use Novolog /Humalog Sliding scale insulin  with Accu-Cheks/Fingersticks as ordered   4)Liver cirrhosis secondary to NASH (HCC) Appears stable and compensated.   -Chronic thrombocytopenia over the past 2 years platelets have ranged from 65-99. - No encephalopathy - Blood pressure has further stabilized - Resuming home dose of diuretic therapy as per GI recommendations - Follow electrolytes and blood pressure tolerance  5) acute on chronic anemia--history of chronic anemia - Hgb was 13.1 on admission probably due to hemoconcentration - Hgb trended down with hydration but still above 12 - Patient reports dark stools but he has been on iron therapy at home - Protonix  twice daily as above in # 1 - Monitor H&H closely especially given thrombocytopenia with increased risk for bleeding  Status is: Inpatient  Disposition: The patient is from: Home              Anticipated d/c is to: Home              Anticipated d/c date is: > 3 days              Patient currently is not medically stable to d/c. Barriers: Not Clinically Stable-   Code Status : -  Code Status: Full Code   Family Communication:    NA (patient is alert, awake and coherent) None at bedside DVT Prophylaxis  :   - SCDs  SCDs Start: 01/27/24 1655   Lab Results  Component Value Date   PLT 84 (L) 01/30/2024    Inpatient Medications  Scheduled Meds:  cefpodoxime   200 mg Oral Q12H   [START ON 01/31/2024] furosemide   80 mg Oral Daily   insulin  aspart  0-9 Units Subcutaneous Q6H   metroNIDAZOLE   500 mg Oral Q8H   pantoprazole  (PROTONIX ) IV  40 mg Intravenous Q12H   [START ON 01/31/2024] spironolactone   200 mg Oral Daily   Continuous Infusions:   PRN Meds:.HYDROmorphone  (DILAUDID ) injection, HYDROmorphone  (DILAUDID ) injection, ondansetron  (ZOFRAN ) IV,  mouth rinse, polyethylene glycol   Anti-infectives (From admission, onward)    Start     Dose/Rate Route Frequency Ordered Stop   01/30/24 1600  cefpodoxime  (VANTIN ) 100 MG/5ML suspension 200 mg        200 mg Oral Every 12 hours 01/30/24 1408     01/30/24 1500  metroNIDAZOLE  (FLAGYL ) tablet 500 mg        500 mg Oral Every 8 hours 01/30/24 1408     01/28/24 1400  meropenem  (MERREM ) 1 g in sodium chloride  0.9 % 100 mL IVPB  Status:  Discontinued        1 g 200 mL/hr over 30 Minutes Intravenous Every 8 hours 01/28/24 0915 01/30/24 1408   01/27/24 0830  ceFEPIme  (MAXIPIME ) 2 g in sodium chloride  0.9 % 100 mL IVPB  Status:  Discontinued        2 g 200 mL/hr over 30 Minutes Intravenous Every 8 hours 01/27/24 0743 01/28/24 0915   01/27/24 0700  metroNIDAZOLE  (FLAGYL ) IVPB 500 mg  Status:  Discontinued        500 mg 100 mL/hr over 60 Minutes Intravenous Every 12 hours 01/27/24 0654 01/28/24 0915   01/26/24 2330  ceFEPIme  (MAXIPIME ) 2 g in sodium chloride  0.9 % 100 mL IVPB        2 g 200 mL/hr over 30 Minutes Intravenous  Once 01/26/24 2316 01/27/24 0043   01/26/24 2330  metroNIDAZOLE  (FLAGYL ) IVPB 500 mg        500 mg 100 mL/hr over 60 Minutes Intravenous  Once 01/26/24 2316 01/27/24 0043       Subjective: Stephen Burch afebrile, no nausea vomiting.  Tolerating diet.  Alkaline phosphatase and bilirubin continue trending down.  Objective: Vitals:   01/29/24 1418 01/29/24 2026 01/30/24 1100 01/30/24 1317  BP: 129/69 (!) 94/54 128/70 131/71  Pulse: 82 79  76  Resp: 19 18  19   Temp: 98.4 F (36.9 C) 98.7 F (37.1 C)  97.9 F (36.6 C)  TempSrc:  Oral  Oral  SpO2: 96% 95%  99%  Weight:      Height:        Intake/Output Summary (Last 24 hours) at 01/30/2024 1827 Last data filed at 01/30/2024 1757 Gross per 24 hour  Intake 600 ml  Output --  Net 600 ml   Filed Weights   01/26/24 2114  Weight: 91 kg    Physical Exam General exam: Alert, awake, oriented x 3; no nausea or  vomiting.  So far tolerating diet.  Afebrile Respiratory system: Good saturation on room air. Cardiovascular system:RRR. No rubs or gallops; no JVD. Gastrointestinal system: Abdomen is slightly distended; mildly tender to palpation.  Positive bowel sounds.  No guarding. Central nervous system:No focal neurological deficits. Extremities: No cyanosis or clubbing. Skin: No petechiae. Psychiatry: Judgement and insight appear normal. Mood & affect appropriate.   Data Reviewed: I have personally reviewed following labs and imaging studies  CBC: Recent Labs  Lab 01/26/24 2139 01/27/24 0723 01/28/24 0418 01/28/24 1331 01/29/24 0454 01/30/24 0428  WBC 3.3* 4.8 5.6  --  5.0 3.8*  NEUTROABS  --  4.1  --   --   --   --   HGB 13.0 13.1 12.3* 12.7* 11.5* 10.8*  HCT 38.3* 37.9* 35.1* 37.3* 32.5* 32.1*  MCV 93.4 93.1 93.1  --  92.6 92.5  PLT 71* 66* 63*  --  69* 84*   Basic Metabolic Panel: Recent Labs  Lab 01/26/24 2139 01/27/24 0723 01/28/24 0418 01/29/24 0454 01/30/24 0428  NA 139 136 134* 136 135  K 3.7 3.9 3.7 3.5 3.5  CL 102 104 105 104 102  CO2 24 26 24 24 25   GLUCOSE 192* 165* 122* 112* 135*  BUN 15 15 16 14 16   CREATININE 0.85 0.77 0.60* 0.70 0.75  CALCIUM 8.5* 8.2* 7.9* 8.2* 7.8*   GFR: Estimated Creatinine Clearance: 98.9 mL/min (by C-G formula based on SCr of 0.75 mg/dL).  Liver Function Tests: Recent Labs  Lab 01/26/24 2139 01/27/24 0723 01/28/24 0418 01/29/24 0454 01/30/24 0428  AST 40 42* 32 22 18  ALT 40 41 36 26 22  ALKPHOS 191* 179* 155* 140* 138*  BILITOT 3.9* 4.8* 9.1* 4.5* 3.3*  PROT 6.8 6.3* 5.8* 5.7* 5.6*  ALBUMIN 3.6 3.2* 2.8* 2.7* 2.6*     Radiology Studies: No results found.    Scheduled Meds:  cefpodoxime   200 mg Oral Q12H   [START ON 01/31/2024] furosemide   80 mg Oral Daily   insulin  aspart  0-9 Units Subcutaneous Q6H   metroNIDAZOLE   500 mg Oral Q8H   pantoprazole  (PROTONIX ) IV  40 mg Intravenous Q12H   [START ON 01/31/2024]  spironolactone   200 mg Oral Daily   Continuous Infusions:     LOS: 3 days    Eric Nunnery M.D on 01/30/2024 at 6:27 PM  Go to [email or web address] - for contact info  [facility] - Office  [phone]  If 7PM-7AM, please contact night-coverage [email or web address] 01/30/2024, 6:27 PM

## 2024-01-30 NOTE — Progress Notes (Signed)
 Subjective: Abdominal distention feeling milldy improved. No abdominal pain. No nausea or vomiting. Tolerate breakfast without issue. No swelling to extremities or episodes of confusion. BM this morning without melena or BRB.  Objective: Vital signs in last 24 hours: Temp:  [98.4 F (36.9 C)-98.7 F (37.1 C)] 98.7 F (37.1 C) (07/30 2026) Pulse Rate:  [79-82] 79 (07/30 2026) Resp:  [18-19] 18 (07/30 2026) BP: (94-129)/(54-69) 94/54 (07/30 2026) SpO2:  [95 %-96 %] 95 % (07/30 2026) Last BM Date : 01/27/24 General:   Alert and oriented, pleasant Head:  Normocephalic and atraumatic. Eyes:  No icterus, sclera clear. Conjuctiva pink.  Mouth:  Without lesions, mucosa pink and moist.  Heart:  S1, S2 present, no murmurs noted.  Lungs: Clear to auscultation bilaterally, without wheezing, rales, or rhonchi.  Abdomen:  Bowel sounds present, mildly distended but soft, non-tender. No HSM or hernias noted. No rebound or guarding. No masses appreciated  Msk:  Symmetrical without gross deformities. Normal posture. Pulses:  Normal pulses noted. Extremities:  Without clubbing or edema. Neurologic:  Alert and  oriented x4;  grossly normal neurologically. No asterixis.  Skin:  Warm and dry, intact without significant lesions. SABRA Psych:  Alert and cooperative. Normal mood and affect.  Intake/Output from previous day: 07/30 0701 - 07/31 0700 In: 900 [P.O.:900] Out: -  Intake/Output this shift: No intake/output data recorded.  Lab Results: Recent Labs    01/28/24 0418 01/28/24 1331 01/29/24 0454 01/30/24 0428  WBC 5.6  --  5.0 3.8*  HGB 12.3* 12.7* 11.5* 10.8*  HCT 35.1* 37.3* 32.5* 32.1*  PLT 63*  --  69* 84*   BMET Recent Labs    01/28/24 0418 01/29/24 0454 01/30/24 0428  NA 134* 136 135  K 3.7 3.5 3.5  CL 105 104 102  CO2 24 24 25   GLUCOSE 122* 112* 135*  BUN 16 14 16   CREATININE 0.60* 0.70 0.75  CALCIUM 7.9* 8.2* 7.8*   LFT Recent Labs    01/28/24 0418 01/29/24 0454  01/30/24 0428  PROT 5.8* 5.7* 5.6*  ALBUMIN 2.8* 2.7* 2.6*  AST 32 22 18  ALT 36 26 22  ALKPHOS 155* 140* 138*  BILITOT 9.1* 4.5* 3.3*   PT/INR Recent Labs    01/29/24 0454 01/30/24 0428  LABPROT 18.3* 16.1*  INR 1.4* 1.2    Assessment: 68 year old male with 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 Kallie in May with recommendations to be seen by tertiary care if cholecystectomy warranted, liver lesion s/p MR liver without suspicious findings, presenting this admission with acute cholecystitis.  GI has been requested to follow along in light of cirrhosis history and management if any decompensation.    Cholecystitis: -Evaluated by Dr. Kallie with plans for conservative management and antibiotic therapy -recommended to Consider cholecystostomy tube placement by IR at [facility] if no improvement while inpatient -Became febrile overnight Monday night up to 101.3 though afebrile since -Antibiotics were changed yesterday from cefepime  to meropenem  -Abdominal pain improving, no nausea or vomiting -plans for augmentin vs. Cefpodoxime /flagyl  as outpatient, our team has recommended avoiding augmentin in setting of cirrhosis if possible  -tolerating soft foods without issue      MASH Cirrhosis: MELD 3.0 16 (influenced by elevated T bili in setting of cholecystitis)   -  Cirrhosis complicated by known esophageal and gastric varices - Intolerant to NSBB in the past, declined updated EGD and TIPS evaluation despite multiple discussions - Reported new onset darker/black?green stools starting Monday x 3 which seems to have resolved - Last dose of oral iron on Friday - Low suspicion for variceal bleed given clinical presentation -  Hemoglobin did decline to 12.3 tuesday, 10.8 today in setting of IV fluids and IV antibiotics - Holding off octreotide for now as this is not felt to be a variceal bleed -some ascites present on exam, diuretics have been held this admission, but restarted lasix  40mg  and spironolactone  100mg  yesterday with mild improvement in abdominal distention today. BP and electrolytes/renal function stable, will increase back to home dosage of diuretics  -No signs of HE, lower extremity edema or concern for SBP at this time  -Recommend tertiary evaluation. referral sent yesterday to [facility] so he can get linked up with them regarding possible cholecystectomy/potential transplant evaluation    Plan: -Trend H&H, monitor for overt GI bleeding - MELD labs daily -increase lasix  to 80mg  daily/spironolactone  100mg  daily - PPI twice daily - Monitor for signs of hepatic encephalopathy - Will need tertiary center transfer if decompensation occurs during admission, referral sent to to [facility] for outpatient evaluation -Continue IV antibiotics, will likely transition to cefpodoxime /flagyl , upon discharge -Continue supportive measures -Pursue IR placement of cholecystostomy tube if persistent symptomatology of cholecystitis     LOS: 3 days    01/30/2024, 9:00 AM  Andromeda Poppen L. Tywana Robotham, MSN, APRN, AGNP-C Adult-Gerontology Nurse Practitioner [facility]

## 2024-01-31 ENCOUNTER — Telehealth: Payer: Self-pay | Admitting: Gastroenterology

## 2024-01-31 DIAGNOSIS — E1169 Type 2 diabetes mellitus with other specified complication: Secondary | ICD-10-CM | POA: Diagnosis not present

## 2024-01-31 DIAGNOSIS — K810 Acute cholecystitis: Secondary | ICD-10-CM | POA: Diagnosis not present

## 2024-01-31 DIAGNOSIS — I8510 Secondary esophageal varices without bleeding: Secondary | ICD-10-CM | POA: Diagnosis not present

## 2024-01-31 DIAGNOSIS — K7581 Nonalcoholic steatohepatitis (NASH): Secondary | ICD-10-CM | POA: Diagnosis not present

## 2024-01-31 LAB — GLUCOSE, CAPILLARY
Glucose-Capillary: 126 mg/dL — ABNORMAL HIGH (ref 70–99)
Glucose-Capillary: 135 mg/dL — ABNORMAL HIGH (ref 70–99)

## 2024-01-31 LAB — PROTIME-INR
INR: 1.3 — ABNORMAL HIGH (ref 0.8–1.2)
Prothrombin Time: 16.7 s — ABNORMAL HIGH (ref 11.4–15.2)

## 2024-01-31 MED ORDER — METRONIDAZOLE 500 MG PO TABS: 500.0000 mg | ORAL_TABLET | Freq: Three times a day (TID) | ORAL | 0 refills | Status: AC

## 2024-01-31 MED ORDER — CEFPODOXIME PROXETIL 100 MG/5ML PO SUSR: 200.0000 mg | Freq: Two times a day (BID) | ORAL | 0 refills | Status: AC

## 2024-01-31 MED ORDER — OXYCODONE HCL 5 MG PO TABS: 5.0000 mg | ORAL_TABLET | Freq: Three times a day (TID) | ORAL | 0 refills | Status: AC | PRN

## 2024-01-31 MED ORDER — FERROUS SULFATE 325 (65 FE) MG PO TABS: 325.0000 mg | ORAL_TABLET | Freq: Every day | ORAL | 5 refills | Status: AC

## 2024-01-31 MED ORDER — PANTOPRAZOLE SODIUM 40 MG PO TBEC: 40.0000 mg | DELAYED_RELEASE_TABLET | Freq: Two times a day (BID) | ORAL | 1 refills | Status: AC

## 2024-01-31 NOTE — Care Management Important Message (Signed)
 Important Message  Patient Details  Name: Stephen Burch MRN: [id number] Date of Birth: 22-May-1956   Important Message Given:  Yes - Medicare IM     Jerica Creegan L Velena Keegan 01/31/2024, 10:00 AM

## 2024-01-31 NOTE — Telephone Encounter (Signed)
 Please arrange for hospital follow up for patient in 2-3 weeks with [facility] or Dr. Eartha. If no availability can be with AB who also saw patient.

## 2024-01-31 NOTE — Discharge Summary (Signed)
 Physician Discharge Summary   Patient: Stephen Burch MRN: [id number] DOB: [birth year]  Admit date:     01/26/2024  Discharge date: 01/31/24  Discharge Physician: Eric Nunnery   PCP: Shona Norleen PEDLAR, MD   Recommendations at discharge:  Repeat CBC to follow hemoglobin and WBCs trend/stability Repeat comprehensive metabolic panel to follow electrolytes, renal function and LFTs Make sure patient follow-up with gastroenterology service as discussed. Continue to follow CBG fluctuation and further adjust hypoglycemia regimen as required.  Discharge Diagnoses: Principal Problem:   Acute cholecystitis Active Problems:   Portal hypertension with esophageal varices (HCC)   Liver cirrhosis secondary to NASH (HCC)   T2DM (type 2 diabetes mellitus) (HCC)   Thrombocytopenia (HCC)   History of atrial fibrillation   Esophageal varices without bleeding [facility])  Brief Hospital admission narrative: As per H&P written by Dr. Pearlean on 01/27/2024  Stephen Burch is a 68 y.o. male with medical history significant for systolic CHF, liver cirrhosis, cardiac arrest 2017, esophageal varices, atrial fibrillation, hypothyroidism, diabetes mellitus. Patient presented to the ED yesterday with complaints of right upper quadrant pain that started yesterday afternoon.  No vomiting.  No fevers no chills. No diarrhea.   ED Course: Tmax 98.4.  Heart rate 69-90.  Respiratory 12-22.  Blood pressure systolic 94-148.  O2 sats 91 to 98% on room air. Platelets 66.  Elevation in liver enzymes, ALP 179, AST 42, T. bili 4.8.  Normal ALP 179. CT scan yesterday 7/27 with findings concerning for acute cholecystitis RUQ US -findings suspicious for acute cholecystitis including multiple stones in the gallbladder neck and cystic duct on recent CT. (See detailed report).   Started on IV metronidazole  and cefepime .  500 mL bolus given last night.   General surgeon Dr. Kallie was consulted-initial plan was to transfer patient to tertiary  care center due to patient's comorbidities of liver cirrhosis and portal hypertension.  Unfortunately [facility] is at capacity, and [facility] -would do essentially what can do here.  Hence hospitalization requested here for antibiotics, and likely IR cholecystectomy tube placement.  Assessment and Plan: 1)Acute cholecystitis CT abdomen and pelvis with contrast and  RUQ US --are consistent with acute cholecystitis  -Bilirubin up but hida had shown no obstruction of the CBD. -General surgeon Dr. Kallie was consulted-initial plan was to transfer patient to tertiary care center due to patient's comorbidities of liver cirrhosis and portal hypertension.  Unfortunately [facility] is at capacity, and [facility] -would do essentially what can do here.   -- Discussed with general surgeon Dr. Kallie and GI team - Try to avoid lap chole as this may precipitate liver failure that may necessitate transplant; -if lap chole needed in the near future patient will have to be at a liver transplant A[facility] facility -Continue conservative management and IV antibiotics. -After conservative management with supportive care and antibiotics condition: Off and improve enough for patient to go home and finish treatment with oral medications - Outpatient follow-up with GI will be arranged and the plan is for patient to follow-up with [facility] hepatic clinic.   2)History of atrial fibrillation Not on anticoagulation or rate limiting meds.  Chronic thrombocytopenia. -Continue monitoring and outpatient follow-up with cardiology service. -Will recommend close monitoring of patient's electrolytes with goal for potassium above 4 and magnesium  above 2 as much as possible.   3)T2DM (type 2 diabetes mellitus) (HCC) Controlled.  A1c 5.5- 10/2023--reflecting excellent diabetic control PTA - Resume home hypoglycemic regimen - Modified carbohydrate diet discussed with patient. - Maintain adequate  hydration.   4)Liver cirrhosis secondary to NASH  (HCC) -Appears stable and compensated.   -Chronic thrombocytopenia over the past 2 years platelets have ranged from 65-99. - No encephalopathy - Blood pressure has further stabilized and patient tolerating back home diuretic regimen (spironolactone  and Lasix ). - Will also continue PPI - Patient advised to follow a low-fat diet and to follow-up with GI service after discharge.   5) acute on chronic anemia--history of chronic anemia - Hgb was 13.1 on admission probably due to hemoconcentration - Hgb trended down with hydration but still above 12 - Patient reports dark stools but he has been on iron therapy at home - Continue Protonix  twice a day - Continue to follow hemoglobin trend with repeat CBC at follow-up visit.  Consultants: GI and general surgery Procedures performed: See below for x-ray reports. Disposition: Home Diet recommendation: Heart healthy/low-fat and modified carbohydrate diet.  DISCHARGE MEDICATION: Allergies as of 01/31/2024       Reactions   Asa [aspirin] Shortness Of Breath   asthma symptoms   Penicillins Shortness Of Breath   Has patient had a PCN reaction causing immediate rash, facial/tongue/throat swelling, SOB or lightheadedness with hypotension: Yes Has patient had a PCN reaction causing severe rash involving mucus membranes or skin necrosis: No Has patient had a PCN reaction that required hospitalization No Has patient had a PCN reaction occurring within the last 10 years: No If all of the above answers are NO, then may proceed with Cephalosporin use. asthma symptoms Patient has tolerated cefazolin , ceftriaxo   Vancomycin  Anaphylaxis   Immediately following being turned into prone position and Vancomycin  administration in the OR patient cardiac arrest w/  Vfib.        Medication List     TAKE these medications    cefpodoxime  100 MG/5ML suspension Commonly known as: VANTIN  Take 10 mLs (200 mg total) by mouth every 12 (twelve) hours for 10  days.   ferrous sulfate  325 (65 FE) MG tablet Commonly known as: FeroSul Take 1 tablet (325 mg total) by mouth daily with breakfast. Start taking on: February 10, 2024 What changed:  medication strength These instructions start on February 10, 2024. If you are unsure what to do until then, ask your doctor or other care provider.   furosemide  40 MG tablet Commonly known as: LASIX  Take 2 tablets by mouth once daily   glipiZIDE  10 MG 24 hr tablet Commonly known as: GLUCOTROL  XL Take 10 mg by mouth daily with breakfast.   metroNIDAZOLE  500 MG tablet Commonly known as: FLAGYL  Take 1 tablet (500 mg total) by mouth every 8 (eight) hours for 10 days.   oxyCODONE  5 MG immediate release tablet Commonly known as: Roxicodone  Take 1 tablet (5 mg total) by mouth every 8 (eight) hours as needed for up to 5 days for severe pain (pain score 7-10).   pantoprazole  40 MG tablet Commonly known as: PROTONIX  Take 1 tablet (40 mg total) by mouth 2 (two) times daily.   Primatene  Mist 0.125 MG/ACT Aero Generic drug: EPINEPHrine  Inhale 1 puff into the lungs daily as needed (asthma).   sildenafil 100 MG tablet Commonly known as: VIAGRA Take 100 mg by mouth daily as needed for erectile dysfunction.   spironolactone  100 MG tablet Commonly known as: ALDACTONE  Take 2 tablets by mouth once daily        Follow-up Information     Shona Norleen PEDLAR, MD. Schedule an appointment as soon as possible for a visit in 2 week(s).  Specialty: Internal Medicine Contact information: [address] [name] [address] [phone]                Discharge Exam: Filed Weights   01/26/24 2114  Weight: 91 kg   General exam: Alert, awake, oriented x 3; no chest pain, no nausea, no vomiting.  Reports feeling better.  Afebrile Respiratory system: Good saturation on room air. Cardiovascular system:RRR.  No rubs or gallops. Gastrointestinal system: Abdomen is mildly distended, positive ascites  appreciated.  Tender with deep palpation in the right upper quadrant.  No guarding.  Positive bowel sounds. Central nervous system: Alert and oriented. No focal neurological deficits. Extremities: No cyanosis or clubbing. Skin: No petechiae. Psychiatry: Judgement and insight appear normal. Mood & affect appropriate.   Condition at discharge: Stable and improved.  The results of significant diagnostics from this hospitalization (including imaging, microbiology, ancillary and laboratory) are listed below for reference.   Imaging Studies: NM Hepatobiliary Liver Func Result Date: 01/27/2024 CLINICAL DATA:  Right upper quadrant pain EXAM: NUCLEAR MEDICINE HEPATOBILIARY IMAGING TECHNIQUE: Sequential images of the abdomen were obtained out to 60 minutes following intravenous administration of radiopharmaceutical. Additional imaging for 30 minutes was obtained after the administration of 2 mg of IV morphine  x1. RADIOPHARMACEUTICALS:  5.2 mCi Tc-33m  Choletec  IV COMPARISON:  Ultrasound 01/19/2024.  CT 01/26/2024. FINDINGS: Prompt uptake and biliary excretion of activity by the liver is seen. Biliary activity passes into small bowel, consistent with patent common bile duct. Initially gallbladder activity is not seen. After administration of morphine  the gallbladder is still non visible. IMPRESSION: No common duct obstruction. However even despite the administration of IV morphine , the gallbladder is not seen. This has a differential including acute cholecystitis. Please correlate with clinical presentation. Electronically Signed   By: Ranell Bring M.D.   On: 01/27/2024 14:45   US  Abdomen Limited RUQ (LIVER/GB) Result Date: 01/27/2024 CLINICAL DATA:  Right upper quadrant abdominal pain for proximally 12 hours. EXAM: ULTRASOUND ABDOMEN LIMITED RIGHT UPPER QUADRANT COMPARISON:  Abdomen and pelvis CT dated 01/26/2024 and 11/03/2023. Right upper quadrant abdomen ultrasound dated 11/03/2023. FINDINGS: Gallbladder:  Dilated with indistinct wall thickening measuring up to 7 mm in thickness. No pericholecystic fluid. Sludge in the gallbladder. Multiple stones seen in the gallbladder neck and cystic duct on the CTs dated 01/26/2024 and 11/03/2023 are not visualized sonographically. This area is obscured by overlying bowel-gas. Positive sonographic Murphy sign. Common bile duct: Diameter: 4.1 mm Liver: Changes of cirrhosis with extensive nodular contours. Portal vein is poorly visualized on color Doppler imaging, patent on the CT obtained yesterday. The direction of flow was difficult to establish on today's exam. Other: None. IMPRESSION: 1. Findings suspicious for acute cholecystitis including multiple stones in the gallbladder neck and cystic duct on the recent CT, large enough to cause cystic duct obstruction; positive sonographic Murphy sign, diffuse gallbladder wall thickening, gallbladder dilatation and sludge in the gallbladder. 2. Changes of cirrhosis of the liver. 3. Poorly visualized portal vein on color Doppler, making direction of flow difficult to determine. Electronically Signed   By: Elspeth Bathe M.D.   On: 01/27/2024 10:41   CT ABDOMEN PELVIS W CONTRAST Result Date: 01/26/2024 CLINICAL DATA:  Right upper quadrant EXAM: CT ABDOMEN AND PELVIS WITH CONTRAST TECHNIQUE: Multidetector CT imaging of the abdomen and pelvis was performed using the standard protocol following bolus administration of intravenous contrast. RADIATION DOSE REDUCTION: This exam was performed according to the departmental dose-optimization program which includes automated exposure control,  adjustment of the mA and/or kV according to patient size and/or use of iterative reconstruction technique. CONTRAST:  OMNIPAQUE  IOHEXOL  300 MG/ML  SOLN COMPARISON:  MRI of the abdomen 12/16/2023. FINDINGS: Lower chest: No acute abnormality. Hepatobiliary: Nodular liver contour is again seen compatible with cirrhosis. Gallstones are present. The  gallbladder is distended there is mild inflammatory stranding surrounding the gallbladder. No biliary ductal dilatation identified. Pancreas: Unremarkable. No pancreatic ductal dilatation or surrounding inflammatory changes. Spleen: The spleen is enlarged, unchanged. Adrenals/Urinary Tract: Rounded hypodensities in the kidneys are too small to characterize, likely cyst. The adrenal glands, kidneys and bladder are otherwise within normal limits. Stomach/Bowel: There is diffuse colonic wall thickening most significant in the ascending colon. There is also diffuse wall thickening and inflammation of the entire duodenum. Appendix is not definitely seen. There is no bowel obstruction, pneumatosis or free air. There is wall thickening of the distal esophagus. Vascular/Lymphatic: Aorta and IVC are normal in size. No enlarged lymph nodes are seen. Left-sided varicocele and splenic varices as well as gastric varices are again noted. Reproductive: Prostate gland is mildly enlarged. Other: There is small volume ascites. Musculoskeletal: Degenerative changes affect the spine. IMPRESSION: 1. Cholelithiasis with distended gallbladder and mild inflammatory stranding surrounding the gallbladder. Findings are concerning for acute cholecystitis. 2. Diffuse colonic wall thickening most significant in the ascending colon worrisome for colitis. 3. Diffuse wall thickening and inflammation of the duodenum compatible with duodenitis. 4. Cirrhosis with sequela of portal hypertension including splenomegaly, varices, and small volume ascites. 5. Wall thickening of the distal esophagus worrisome for esophagitis. Electronically Signed   By: Greig Pique M.D.   On: 01/26/2024 23:08    Microbiology: Results for orders placed or performed during the hospital encounter of 11/02/20  SARS CORONAVIRUS 2 (TAT 6-24 HRS) Nasopharyngeal Nasopharyngeal Swab     Status: None   Collection Time: 11/02/20  2:51 PM   Specimen: Nasopharyngeal Swab   Result Value Ref Range Status   SARS Coronavirus 2 NEGATIVE NEGATIVE Final    Comment: (NOTE) SARS-CoV-2 target nucleic acids are NOT DETECTED.  The SARS-CoV-2 RNA is generally detectable in upper and lower respiratory specimens during the acute phase of infection. Negative results do not preclude SARS-CoV-2 infection, do not rule out co-infections with other pathogens, and should not be used as the sole basis for treatment or other patient management decisions. Negative results must be combined with clinical observations, patient history, and epidemiological information. The expected result is Negative.  Fact Sheet for Patients: HairSlick.no  Fact Sheet for Healthcare Providers: quierodirigir.com  This test is not yet approved or cleared by the [place]  FDA and  has been authorized for detection and/or diagnosis of SARS-CoV-2 by FDA under an Emergency Use Authorization (EUA). This EUA will remain  in effect (meaning this test can be used) for the duration of the COVID-19 declaration under Se ction 564(b)(1) of the Act, 21 U.S.C. section 360bbb-3(b)(1), unless the authorization is terminated or revoked sooner.  Performed at [facility], [address], [place], [place] [address]     Labs: CBC: Recent Labs  Lab 01/26/24 2139 01/27/24 0723 01/28/24 0418 01/28/24 1331 01/29/24 0454 01/30/24 0428  WBC 3.3* 4.8 5.6  --  5.0 3.8*  NEUTROABS  --  4.1  --   --   --   --   HGB 13.0 13.1 12.3* 12.7* 11.5* 10.8*  HCT 38.3* 37.9* 35.1* 37.3* 32.5* 32.1*  MCV 93.4 93.1 93.1  --  92.6 92.5  PLT 71* 66* 63*  --  69* 84*   Basic Metabolic Panel: Recent Labs  Lab 01/26/24 2139 01/27/24 0723 01/28/24 0418 01/29/24 0454 01/30/24 0428  NA 139 136 134* 136 135  K 3.7 3.9 3.7 3.5 3.5  CL 102 104 105 104 102  CO2 24 26 24 24 25   GLUCOSE 192* 165* 122* 112* 135*  BUN 15 15 16 14 16   CREATININE 0.85 0.77 0.60*  0.70 0.75  CALCIUM 8.5* 8.2* 7.9* 8.2* 7.8*   Liver Function Tests: Recent Labs  Lab 01/26/24 2139 01/27/24 0723 01/28/24 0418 01/29/24 0454 01/30/24 0428  AST 40 42* 32 22 18  ALT 40 41 36 26 22  ALKPHOS 191* 179* 155* 140* 138*  BILITOT 3.9* 4.8* 9.1* 4.5* 3.3*  PROT 6.8 6.3* 5.8* 5.7* 5.6*  ALBUMIN 3.6 3.2* 2.8* 2.7* 2.6*   CBG: Recent Labs  Lab 01/30/24 0631 01/30/24 1136 01/30/24 1706 01/31/24 0027 01/31/24 0528  GLUCAP 124* 181* 118* 126* 135*    Discharge time spent:  35 minutes.  Signed: Eric Nunnery, MD [facility] 01/31/2024

## 2024-01-31 NOTE — Plan of Care (Signed)
  Problem: Education: Goal: Knowledge of General Education information will improve Description: Including pain rating scale, medication(s)/side effects and non-pharmacologic comfort measures Outcome: Progressing   Problem: Clinical Measurements: Goal: Ability to maintain clinical measurements within normal limits will improve Outcome: Progressing   Problem: Coping: Goal: Ability to adjust to condition or change in health will improve Outcome: Progressing   Problem: Metabolic: Goal: Ability to maintain appropriate glucose levels will improve Outcome: Progressing

## 2024-02-03 ENCOUNTER — Telehealth: Payer: Self-pay

## 2024-02-03 NOTE — Patient Instructions (Signed)
 Visit Information  Thank you for taking time to visit with me today. Please don't hesitate to contact me if I can be of assistance to you before our next scheduled telephone appointment.  Our next appointment is by telephone on 02/12/24 at 100 pm  Following is a copy of your care plan:   Goals Addressed             This Visit's Progress    VBCI Transitions of Care (TOC) Care Plan       Problems:  Recent Hospitalization for treatment of Acute Cholecystitis Knowledge Deficit Related to Acute Cholecystitis Pending GI referral to [facility] due to liver disease and cholecystitis  Goal:  Over the next 30 days, the patient will not experience hospital readmission  Interventions:  Transitions of Care: Doctor Visits  - discussed the importance of doctor visits Reviewed Signs and symptoms of infection If you have persistent or recurring pain, it's always a good idea to check with a healthcare provider about it.  Seek medical care for severe or worsening pain, or for pain accompanied by red flag symptoms, such as: Blood in your poop or vomit. High fever. Dizziness or confusion. Trouble breathing. Jaundice (yellowing of your skin and the whites of your eyes). Pain worsening with exercise. Visible swelling in your abdomen.  Patient Self Care Activities:  Attend all scheduled provider appointments Call provider office for new concerns or questions  Notify RN Care Manager of TOC call rescheduling needs Participate in Transition of Care Program/Attend TOC scheduled calls Take medications as prescribed    Plan:  The patient has been provided with contact information for the care management team and has been advised to call with any health related questions or concerns.         Patient verbalizes understanding of instructions and care plan provided today and agrees to view in MyChart. Active MyChart status and patient understanding of how to access instructions and care plan via MyChart  confirmed with patient.     The patient has been provided with contact information for the care management team and has been advised to call with any health related questions or concerns.   Please call the care guide team at [phone] if you need to cancel or reschedule your appointment.   Please call the Suicide and Crisis Lifeline: 988 call the [place]  National Suicide Prevention Lifeline: 414-493-3408 or TTY: (870)053-2323 TTY (340)454-7156) to talk to a trained counselor if you are experiencing a Mental Health or Behavioral Health Crisis or need someone to talk to.  Shontel Santee J. Harneet Noblett RN, MSN [facility], [facility] RN Care Manager Direct Dial: [phone]  Fax: [phone] Website: [email or web address]

## 2024-02-03 NOTE — Transitions of Care (Post Inpatient/ED Visit) (Signed)
 02/03/2024  Name: Stephen Burch MRN: [id number] DOB: 13-Dec-1955  Today's TOC FU Call Status: Today's TOC FU Call Status:: Successful TOC FU Call Completed TOC FU Call Complete Date: 02/03/24 Patient's Name and Date of Birth confirmed.  Transition Care Management Follow-up Telephone Call Date of Discharge: 01/31/24 Discharge [facility] ([facility] Type of Discharge: Inpatient Admission Primary Inpatient Discharge Diagnosis:: Acute cholecystitis How have you been since you were released from the hospital?: Better Any questions or concerns?: No  Items Reviewed: Did you receive and understand the discharge instructions provided?: Yes Medications obtained,verified, and reconciled?: Yes (Medications Reviewed) Any new allergies since your discharge?: No Dietary orders reviewed?: Yes Type of Diet Ordered:: Heart Healthy, low sodium, carb modified Do you have support at home?: Yes People in Home [RPT]: parent(s) Name of Support/Comfort Primary Source: Mom lives 2 doors down  Medications Reviewed Today: Medications Reviewed Today     Reviewed by Stephen Nowling, RN (Case Manager) on 02/03/24 at 1244  Med List Status: <None>   Medication Order Taking? Sig Documenting Provider Last Dose Status Informant  cefpodoxime  (VANTIN ) 100 MG/5ML suspension [id number] Yes Take 10 mLs (200 mg total) by mouth every 12 (twelve) hours for 10 days. Stephen Fines, MD  Active   EPINEPHrine  (PRIMATENE  MIST) 0.125 MG/ACT Stephen Burch [id number] Yes Inhale 1 puff into the lungs daily as needed (asthma). [provider]  Active Self, Pharmacy Records  ferrous sulfate  (FEROSUL) 325 (65 FE) MG tablet [id number]  Take 1 tablet (325 mg total) by mouth daily with breakfast.  Patient not taking: Reported on 02/03/2024   Stephen Fines, MD  Active   furosemide  (LASIX ) 40 MG tablet [id number] Yes Take 2 tablets by mouth once daily Burch, Stephen L, NP  Active Self, Pharmacy Records  glipiZIDE  (GLUCOTROL  XL) 10 MG 24 hr  tablet [id number] Yes Take 10 mg by mouth daily with breakfast. [provider]  Active Self, Pharmacy Records  metroNIDAZOLE  (FLAGYL ) 500 MG tablet [id number] Yes Take 1 tablet (500 mg total) by mouth every 8 (eight) hours for 10 days. Stephen Fines, MD  Active   oxyCODONE  (ROXICODONE ) 5 MG immediate release tablet [id number] Yes Take 1 tablet (5 mg total) by mouth every 8 (eight) hours as needed for up to 5 days for severe pain (pain score 7-10). Stephen Fines, MD  Active   pantoprazole  (PROTONIX ) 40 MG tablet [id number] Yes Take 1 tablet (40 mg total) by mouth 2 (two) times daily. Stephen Fines, MD  Active   sildenafil (VIAGRA) 100 MG tablet [id number]  Take 100 mg by mouth daily as needed for erectile dysfunction. [provider]  Active Self, Pharmacy Records  spironolactone  (ALDACTONE ) 100 MG tablet [id number] Yes Take 2 tablets by mouth once daily Burch, Stephen L, NP  Active Self, Pharmacy Records            Home Care and Equipment/Supplies: Were Home Health Services Ordered?: NA Any new equipment or medical supplies ordered?: NA  Functional Questionnaire: Do you need assistance with bathing/showering or dressing?: No Do you need assistance with meal preparation?: No Do you need assistance with eating?: No Do you have difficulty maintaining continence: No Do you need assistance with getting out of bed/getting out of a chair/moving?: No Do you have difficulty managing or taking your medications?: No  Follow up appointments reviewed: PCP Follow-up appointment confirmed?: Yes Date of PCP follow-up appointment?: 02/11/24 Follow-up Provider: Dr. Shona Do you need transportation to your follow-up appointment?: No Do you understand care options if  your condition(s) worsen?: Yes-patient verbalized understanding  SDOH Interventions Today    Flowsheet Row Most Recent Value  SDOH Interventions   Food Insecurity Interventions Intervention Not Indicated  Housing  Interventions Intervention Not Indicated  Transportation Interventions Intervention Not Indicated  Utilities Interventions Intervention Not Indicated   Stephen Locklin J. Crysten Kaman RN, MSN [facility]  [facility], [facility] RN Care Manager Direct Dial: [phone]  Fax: [phone] Website: [email or web address]

## 2024-02-05 DIAGNOSIS — D649 Anemia, unspecified: Secondary | ICD-10-CM | POA: Diagnosis not present

## 2024-02-05 DIAGNOSIS — E1165 Type 2 diabetes mellitus with hyperglycemia: Secondary | ICD-10-CM | POA: Diagnosis not present

## 2024-02-05 DIAGNOSIS — Z125 Encounter for screening for malignant neoplasm of prostate: Secondary | ICD-10-CM | POA: Diagnosis not present

## 2024-02-05 DIAGNOSIS — E0590 Thyrotoxicosis, unspecified without thyrotoxic crisis or storm: Secondary | ICD-10-CM | POA: Diagnosis not present

## 2024-02-05 DIAGNOSIS — K7460 Unspecified cirrhosis of liver: Secondary | ICD-10-CM | POA: Diagnosis not present

## 2024-02-05 DIAGNOSIS — R972 Elevated prostate specific antigen [PSA]: Secondary | ICD-10-CM | POA: Diagnosis not present

## 2024-02-11 DIAGNOSIS — K219 Gastro-esophageal reflux disease without esophagitis: Secondary | ICD-10-CM | POA: Diagnosis not present

## 2024-02-11 DIAGNOSIS — N3281 Overactive bladder: Secondary | ICD-10-CM | POA: Diagnosis not present

## 2024-02-11 DIAGNOSIS — I8500 Esophageal varices without bleeding: Secondary | ICD-10-CM | POA: Diagnosis not present

## 2024-02-11 DIAGNOSIS — D649 Anemia, unspecified: Secondary | ICD-10-CM | POA: Diagnosis not present

## 2024-02-11 DIAGNOSIS — D696 Thrombocytopenia, unspecified: Secondary | ICD-10-CM | POA: Diagnosis not present

## 2024-02-11 DIAGNOSIS — R972 Elevated prostate specific antigen [PSA]: Secondary | ICD-10-CM | POA: Diagnosis not present

## 2024-02-11 DIAGNOSIS — E1165 Type 2 diabetes mellitus with hyperglycemia: Secondary | ICD-10-CM | POA: Diagnosis not present

## 2024-02-11 DIAGNOSIS — N289 Disorder of kidney and ureter, unspecified: Secondary | ICD-10-CM | POA: Diagnosis not present

## 2024-02-11 DIAGNOSIS — F5221 Male erectile disorder: Secondary | ICD-10-CM | POA: Diagnosis not present

## 2024-02-11 DIAGNOSIS — D61818 Other pancytopenia: Secondary | ICD-10-CM | POA: Diagnosis not present

## 2024-02-11 DIAGNOSIS — K7460 Unspecified cirrhosis of liver: Secondary | ICD-10-CM | POA: Diagnosis not present

## 2024-02-11 DIAGNOSIS — E0590 Thyrotoxicosis, unspecified without thyrotoxic crisis or storm: Secondary | ICD-10-CM | POA: Diagnosis not present

## 2024-02-12 ENCOUNTER — Other Ambulatory Visit: Payer: Self-pay

## 2024-02-12 ENCOUNTER — Telehealth (INDEPENDENT_AMBULATORY_CARE_PROVIDER_SITE_OTHER): Payer: Self-pay | Admitting: Gastroenterology

## 2024-02-12 DIAGNOSIS — K7460 Unspecified cirrhosis of liver: Secondary | ICD-10-CM | POA: Diagnosis not present

## 2024-02-12 DIAGNOSIS — R188 Other ascites: Secondary | ICD-10-CM | POA: Diagnosis not present

## 2024-02-12 DIAGNOSIS — K828 Other specified diseases of gallbladder: Secondary | ICD-10-CM | POA: Diagnosis not present

## 2024-02-12 DIAGNOSIS — K8020 Calculus of gallbladder without cholecystitis without obstruction: Secondary | ICD-10-CM | POA: Diagnosis not present

## 2024-02-12 DIAGNOSIS — I861 Scrotal varices: Secondary | ICD-10-CM | POA: Diagnosis not present

## 2024-02-12 DIAGNOSIS — R161 Splenomegaly, not elsewhere classified: Secondary | ICD-10-CM | POA: Diagnosis not present

## 2024-02-12 DIAGNOSIS — K766 Portal hypertension: Secondary | ICD-10-CM | POA: Diagnosis not present

## 2024-02-12 NOTE — Telephone Encounter (Signed)
 Pt came to front desk asking to speak with Mitzie Boettcher, NP. I told him she was out of office today. He said we referred him to [facility] for gallbladder surgery and his appointment is on Monday. He said his insurance is out of network and doesn't know what he needs to do. [phone]

## 2024-02-12 NOTE — Telephone Encounter (Signed)
 I am unaware of any Peer to Peer that needs to be done for referrals

## 2024-02-12 NOTE — Telephone Encounter (Signed)
 I spoke with the patient and made him aware per [facility], He needs to call his insurance and see if they will allow a waiver for him to see an out of network provider given the services he needs cannot be done just anywhere. He can also inquire with them if [facility] Is in network with them as we may have to refer him to [facility] if he cannot be seen at [facility]. Patient states understanding and will call his insurance back to inquire about the waiver and if [facility] is in network with his insurance. Patient says she spoke with his insurance and they told him they needed a Peer to Peer authorization that needed to go with the referral. Jenkins do you know anything about a peer to peer authorization that needed to be sent along with the referral? Patient will call his insurance back and let us  know what they say.

## 2024-02-12 NOTE — Transitions of Care (Post Inpatient/ED Visit) (Signed)
 Transition of Care week 2  Visit Note  02/12/2024  Name: Stephen Burch MRN: [id number]          DOB: 12/27/1955  Situation: Patient enrolled in [facility] 30-day program. Visit completed with patient by telephone.   Background:   Initial Transition Care Management Follow-up Telephone Call    Past Medical History:  Diagnosis Date   Acute systolic (congestive) heart failure (HCC) 04/30/2018   Arthritis    knees, wrists   Barrett's esophagus    Cardiac arrest (HCC) 02/22/2016   Complication of anesthesia    02-22-2016 intraop cardiac arrest immediately after vancomyocin administration , pt cardiac arrest w/ Vfib,  please refer to anesthesia record in epic   Dysrhythmia    Afib 12/2018 - converted to NSR   Esophageal varices determined by endoscopy (HCC) 11/2016   grade 1   GERD (gastroesophageal reflux disease)    Hiatal hernia    History of adenomatous polyp of colon    History of asthma    as child   History of DVT (deep vein thrombosis)    right lower extremitty post-op right total knee surgery 09/ 2010,  treated w/ coumadin   History of esophageal dilatation 07/1998   for schatzski ring   History of kidney stones    History of staph infection 04/2016   MSSA infection of right total knee w/ sepsis   Hx of cardiac arrest    02-22-2016  intraoperative, immediately following moving pt into prone position and vancomyocin administration, cardiac arrest w/ Vfib (referred to anesthesia record in epic) pt extubated himself next day   Infection of prosthetic right knee joint (HCC)    Liver cirrhosis secondary to NASH (nonalcoholic steatohepatitis) (HCC)    NAFLD-- followed by dr golda---  liver bx 09-30-2012  mild portal and focal sinusoidal fibrosis   Pre-diabetes    Pyogenic arthritis of right knee joint (HCC) 04/02/2016   Scapholunate advanced collapse of left wrist    deformity   Shock circulatory (HCC)    Staphylococcus aureus bacteremia with sepsis (HCC)    Thrombocytopenia  (HCC)    10-09-2017 per pt his platelets have always been low , followed by pcp, never been referred to hematologist    Assessment: Patient Reported Symptoms: Cognitive Cognitive Status: Alert and oriented to person, place, and time, Normal speech and language skills      Neurological Neurological Review of Symptoms: No symptoms reported    HEENT HEENT Symptoms Reported: No symptoms reported      Cardiovascular Cardiovascular Symptoms Reported: No symptoms reported    Respiratory Respiratory Symptoms Reported: No symptoms reported    Endocrine Endocrine Symptoms Reported: No symptoms reported Is patient diabetic?: Yes Is patient checking blood sugars at home?: Yes List most recent blood sugar readings, include date and time of day: Blood sugar remains good with last checl 116. Endocrine Self-Management Outcome: 4 (good)  Gastrointestinal Gastrointestinal Symptoms Reported: No symptoms reported      Genitourinary Genitourinary Symptoms Reported: No symptoms reported    Integumentary Integumentary Symptoms Reported: No symptoms reported    Musculoskeletal Musculoskelatal Symptoms Reviewed: No symptoms reported        Psychosocial Psychosocial Symptoms Reported: Other Other Psychosocial Conditions: Patient reports he feels down about Insurance and coverage but otherwise doing well. Behavioral Management Strategies: Coping strategies       There were no vitals filed for this visit.  Medications Reviewed Today     Reviewed by Kristain Filo, RN (Case Manager) on 02/12/24  at 1352  Med List Status: <None>   Medication Order Taking? Sig Documenting Provider Last Dose Status Informant  EPINEPHrine  (PRIMATENE  MIST) 0.125 MG/ACT AERO [id number] Yes Inhale 1 puff into the lungs daily as needed (asthma). [provider]  Active Self, Pharmacy Records  ferrous sulfate  (FEROSUL) 325 (65 FE) MG tablet [id number]  Take 1 tablet (325 mg total) by mouth daily with breakfast.   Patient not taking: Reported on 02/12/2024   Ricky Fines, MD  Active   furosemide  (LASIX ) 40 MG tablet [id number] Yes Take 2 tablets by mouth once daily Carlan, [name] L, NP  Active Self, Pharmacy Records  glipiZIDE  (GLUCOTROL  XL) 10 MG 24 hr tablet [id number] Yes Take 10 mg by mouth daily with breakfast. [provider]  Active Self, Pharmacy Records  pantoprazole  (PROTONIX ) 40 MG tablet [id number] Yes Take 1 tablet (40 mg total) by mouth 2 (two) times daily. Ricky Fines, MD  Active   sildenafil (VIAGRA) 100 MG tablet [id number] Yes Take 100 mg by mouth daily as needed for erectile dysfunction. [provider]  Active Self, Pharmacy Records  spironolactone  (ALDACTONE ) 100 MG tablet [id number] Yes Take 2 tablets by mouth once daily Carlan, [name] L, NP  Active Self, Pharmacy Records            Goals Addressed             This Visit's Progress    VBCI Transitions of Care (TOC) Care Plan       Problems:  Recent Hospitalization for treatment of Acute Cholecystitis Knowledge Deficit Related to Acute Cholecystitis Pending GI referral to [facility] due to liver disease and cholecystitis- patient reports that going to [facility] will be out of network and a peer to peer review will need to be done.  However, patient plans to continue to go for consultation at [facility]. Encouraged patient to hang in there and continue as scheduled.    Goal:  Over the next 30 days, the patient will not experience hospital readmission  Interventions:  Transitions of Care: Doctor Visits  - discussed the importance of doctor visits Reviewed Signs and symptoms of infection If you have persistent or recurring pain, it's always a good idea to check with a healthcare provider about it.  Seek medical care for severe or worsening pain, or for pain accompanied by red flag symptoms, such as: Blood in your poop or vomit. High fever. Dizziness or confusion. Trouble breathing. Jaundice (yellowing of your skin  and the whites of your eyes). Pain worsening with exercise. Visible swelling in your abdomen.  Patient Self Care Activities:  Attend all scheduled provider appointments Call provider office for new concerns or questions  Notify RN Care Manager of TOC call rescheduling needs Participate in Transition of Care Program/Attend TOC scheduled calls Take medications as prescribed    Plan:  The patient has been provided with contact information for the care management team and has been advised to call with any health related questions or concerns.         Recommendation:   Continue Current Plan of Care  Follow Up Plan:   Telephone follow-up in 1 week  Tranika Scholler J. Crescentia Boutwell RN, MSN [facility], [facility] RN Care Manager Direct Dial: [phone]  Fax: [phone] Website: [email or web address]

## 2024-02-12 NOTE — Patient Instructions (Signed)
 Visit Information  Thank you for taking time to visit with me today. Please don't hesitate to contact me if I can be of assistance to you before our next scheduled telephone appointment.  Our next appointment is by telephone on 02/19/24 at 1200 pm  Following is a copy of your care plan:   Goals Addressed             This Visit's Progress    VBCI Transitions of Care (TOC) Care Plan       Problems:  Recent Hospitalization for treatment of Acute Cholecystitis Knowledge Deficit Related to Acute Cholecystitis Pending GI referral to [facility] due to liver disease and cholecystitis- patient reports that going to [facility] will be out of network and a peer to peer review will need to be done.  However, patient plans to continue to go for consultation at [facility]. Encouraged patient to hang in there and continue as scheduled.    Goal:  Over the next 30 days, the patient will not experience hospital readmission  Interventions:  Transitions of Care: Doctor Visits  - discussed the importance of doctor visits Reviewed Signs and symptoms of infection If you have persistent or recurring pain, it's always a good idea to check with a healthcare provider about it.  Seek medical care for severe or worsening pain, or for pain accompanied by red flag symptoms, such as: Blood in your poop or vomit. High fever. Dizziness or confusion. Trouble breathing. Jaundice (yellowing of your skin and the whites of your eyes). Pain worsening with exercise. Visible swelling in your abdomen.  Patient Self Care Activities:  Attend all scheduled provider appointments Call provider office for new concerns or questions  Notify RN Care Manager of TOC call rescheduling needs Participate in Transition of Care Program/Attend TOC scheduled calls Take medications as prescribed    Plan:  The patient has been provided with contact information for the care management team and has been advised to call with any health related questions  or concerns.         Patient verbalizes understanding of instructions and care plan provided today and agrees to view in MyChart. Active MyChart status and patient understanding of how to access instructions and care plan via MyChart confirmed with patient.     The patient has been provided with contact information for the care management team and has been advised to call with any health related questions or concerns.   Please call the care guide team at [phone] if you need to cancel or reschedule your appointment.   Please call the Suicide and Crisis Lifeline: 988 if you are experiencing a Mental Health or Behavioral Health Crisis or need someone to talk to.  Shemiah Rosch J. Trayquan Kolakowski RN, MSN [facility], [facility] Health RN Care Manager Direct Dial: [phone]  Fax: [phone] Website: [email or web address]

## 2024-02-12 NOTE — Telephone Encounter (Signed)
 I spoke to [place] at [employer] inquired about the peer to peer, she said patient does not have [employer] benefits and the request should be submitted from [facility] with the doctors info and all the CPT codes they would use for the visits. I spoke to [place] and advised him of this

## 2024-02-17 DIAGNOSIS — Z1159 Encounter for screening for other viral diseases: Secondary | ICD-10-CM | POA: Diagnosis not present

## 2024-02-17 DIAGNOSIS — K7469 Other cirrhosis of liver: Secondary | ICD-10-CM | POA: Diagnosis not present

## 2024-02-17 DIAGNOSIS — R7871 Abnormal lead level in blood: Secondary | ICD-10-CM | POA: Diagnosis not present

## 2024-02-17 DIAGNOSIS — K810 Acute cholecystitis: Secondary | ICD-10-CM | POA: Diagnosis not present

## 2024-02-19 ENCOUNTER — Other Ambulatory Visit: Payer: Self-pay

## 2024-02-19 NOTE — Patient Instructions (Signed)
 Visit Information  Thank you for taking time to visit with me today. Please don't hesitate to contact me if I can be of assistance to you before our next scheduled telephone appointment.  Our next appointment is by telephone on 02/26/24 at 1:00 pm  Following is a copy of your care plan:   Goals Addressed             This Visit's Progress    VBCI Transitions of Care (TOC) Care Plan       Problems:  Recent Hospitalization for treatment of Acute Cholecystitis Knowledge Deficit Related to Acute Cholecystitis Patient saw GI MD at [facility] on Monday.  They have agreed with surgery.  Patient waiting on a date.  Still pending with [employer] coverage but patient will also talk with [facility] Department as well.     Goal:  Over the next 30 days, the patient will not experience hospital readmission  Interventions:  Transitions of Care: Doctor Visits  - discussed the importance of doctor visits Reviewed Signs and symptoms of infection If you have persistent or recurring pain, it's always a good idea to check with a healthcare provider about it.  Seek medical care for severe or worsening pain, or for pain accompanied by red flag symptoms, such as: Blood in your poop or vomit. High fever. Dizziness or confusion. Trouble breathing. Jaundice (yellowing of your skin and the whites of your eyes). Pain worsening with exercise. Visible swelling in your abdomen.  Patient Self Care Activities:  Attend all scheduled provider appointments Call provider office for new concerns or questions  Notify RN Care Manager of TOC call rescheduling needs Participate in Transition of Care Program/Attend TOC scheduled calls Take medications as prescribed    Plan:  The patient has been provided with contact information for the care management team and has been advised to call with any health related questions or concerns.         Patient verbalizes understanding of instructions and care plan provided today and  agrees to view in MyChart. Active MyChart status and patient understanding of how to access instructions and care plan via MyChart confirmed with patient.     The patient has been provided with contact information for the care management team and has been advised to call with any health related questions or concerns.   Please call the care guide team at [phone] if you need to cancel or reschedule your appointment.   Please call the Suicide and Crisis Lifeline: 988 if you are experiencing a Mental Health or Behavioral Health Crisis or need someone to talk to.  Tikia Skilton J. Tamel Abel RN, MSN [facility], [facility] Health RN Care Manager Direct Dial: [phone]  Fax: [phone] Website: [email or web address]

## 2024-02-19 NOTE — Transitions of Care (Post Inpatient/ED Visit) (Signed)
 Transition of Care week 3  Visit Note  02/19/2024  Name: Stephen Burch MRN: [id number]          DOB: 1956-04-09  Situation: Patient enrolled in [facility] 30-day program. Visit completed with patient by telephone.   Background:   Initial Transition Care Management Follow-up Telephone Call    Past Medical History:  Diagnosis Date   Acute systolic (congestive) heart failure (HCC) 04/30/2018   Arthritis    knees, wrists   Barrett's esophagus    Cardiac arrest (HCC) 02/22/2016   Complication of anesthesia    02-22-2016 intraop cardiac arrest immediately after vancomyocin administration , pt cardiac arrest w/ Vfib,  please refer to anesthesia record in epic   Dysrhythmia    Afib 12/2018 - converted to NSR   Esophageal varices determined by endoscopy (HCC) 11/2016   grade 1   GERD (gastroesophageal reflux disease)    Hiatal hernia    History of adenomatous polyp of colon    History of asthma    as child   History of DVT (deep vein thrombosis)    right lower extremitty post-op right total knee surgery 09/ 2010,  treated w/ coumadin   History of esophageal dilatation 07/1998   for schatzski ring   History of kidney stones    History of staph infection 04/2016   MSSA infection of right total knee w/ sepsis   Hx of cardiac arrest    02-22-2016  intraoperative, immediately following moving pt into prone position and vancomyocin administration, cardiac arrest w/ Vfib (referred to anesthesia record in epic) pt extubated himself next day   Infection of prosthetic right knee joint (HCC)    Liver cirrhosis secondary to NASH (nonalcoholic steatohepatitis) (HCC)    NAFLD-- followed by dr golda---  liver bx 09-30-2012  mild portal and focal sinusoidal fibrosis   Pre-diabetes    Pyogenic arthritis of right knee joint (HCC) 04/02/2016   Scapholunate advanced collapse of left wrist    deformity   Shock circulatory (HCC)    Staphylococcus aureus bacteremia with sepsis (HCC)    Thrombocytopenia  (HCC)    10-09-2017 per pt his platelets have always been low , followed by pcp, never been referred to hematologist    Assessment: Patient Reported Symptoms: Cognitive Cognitive Status: Alert and oriented to person, place, and time, Normal speech and language skills      Neurological Neurological Review of Symptoms: No symptoms reported    HEENT HEENT Symptoms Reported: No symptoms reported      Cardiovascular Cardiovascular Symptoms Reported: No symptoms reported    Respiratory Respiratory Symptoms Reported: No symptoms reported    Endocrine Endocrine Symptoms Reported: No symptoms reported Is patient diabetic?: Yes Is patient checking blood sugars at home?: Yes List most recent blood sugar readings, include date and time of day: Bllod sugar last check was 118 today Endocrine Self-Management Outcome: 4 (good)  Gastrointestinal Gastrointestinal Symptoms Reported: No symptoms reported Additional Gastrointestinal Details: Patient being seen by [facility] Due to [facility] and Liver issues.  Surgery pending.  Patient denies any symptoms presently.      Genitourinary Genitourinary Symptoms Reported: No symptoms reported    Integumentary Integumentary Symptoms Reported: No symptoms reported    Musculoskeletal Musculoskelatal Symptoms Reviewed: No symptoms reported        Psychosocial Psychosocial Symptoms Reported: No symptoms reported         There were no vitals filed for this visit.  Medications Reviewed Today     Reviewed by [place],  Elner Seifert, RN (Case Production designer, theatre/television/film) on 02/19/24 at 1201  Med List Status: <None>   Medication Order Taking? Sig Documenting Provider Last Dose Status Informant  EPINEPHrine  (PRIMATENE  MIST) 0.125 MG/ACT AERO [id number] Yes Inhale 1 puff into the lungs daily as needed (asthma). [provider]  Active Self, Pharmacy Records  ferrous sulfate  (FEROSUL) 325 (65 FE) MG tablet [id number]  Take 1 tablet (325 mg total) by mouth daily with breakfast.   Patient not taking: Reported on 02/19/2024   Ricky Fines, MD  Active   furosemide  (LASIX ) 40 MG tablet [id number] Yes Take 2 tablets by mouth once daily Carlan, [name] L, NP  Active Self, Pharmacy Records  glipiZIDE  (GLUCOTROL  XL) 10 MG 24 hr tablet [id number] Yes Take 10 mg by mouth daily with breakfast. [provider]  Active Self, Pharmacy Records  pantoprazole  (PROTONIX ) 40 MG tablet [id number] Yes Take 1 tablet (40 mg total) by mouth 2 (two) times daily. Ricky Fines, MD  Active   sildenafil (VIAGRA) 100 MG tablet [id number] Yes Take 100 mg by mouth daily as needed for erectile dysfunction. [provider]  Active Self, Pharmacy Records  spironolactone  (ALDACTONE ) 100 MG tablet [id number] Yes Take 2 tablets by mouth once daily Carlan, Mitzie CROME, NP  Active Self, Pharmacy Records            Goals Addressed             This Visit's Progress    VBCI Transitions of Care (TOC) Care Plan       Problems:  Recent Hospitalization for treatment of Acute Cholecystitis Knowledge Deficit Related to Acute Cholecystitis Patient saw GI MD at [facility] on Monday.  They have agreed with surgery.  Patient waiting on a date.  Still pending with [employer] coverage but patient will also talk with [facility] Department as well.     Goal:  Over the next 30 days, the patient will not experience hospital readmission  Interventions:  Transitions of Care: Doctor Visits  - discussed the importance of doctor visits Reviewed Signs and symptoms of infection If you have persistent or recurring pain, it's always a good idea to check with a healthcare provider about it.  Seek medical care for severe or worsening pain, or for pain accompanied by red flag symptoms, such as: Blood in your poop or vomit. High fever. Dizziness or confusion. Trouble breathing. Jaundice (yellowing of your skin and the whites of your eyes). Pain worsening with exercise. Visible swelling in your abdomen.  Patient  Self Care Activities:  Attend all scheduled provider appointments Call provider office for new concerns or questions  Notify RN Care Manager of TOC call rescheduling needs Participate in Transition of Care Program/Attend TOC scheduled calls Take medications as prescribed    Plan:  The patient has been provided with contact information for the care management team and has been advised to call with any health related questions or concerns.         Recommendation:   Continue Current Plan of Care  Follow Up Plan:   Telephone follow-up in 1 week  Anie Juniel J. Wilbon Obenchain RN, MSN [facility], [facility] RN Care Manager Direct Dial: [phone]  Fax: [phone] Website: [email or web address]

## 2024-02-25 ENCOUNTER — Encounter (INDEPENDENT_AMBULATORY_CARE_PROVIDER_SITE_OTHER): Payer: Self-pay | Admitting: Gastroenterology

## 2024-02-25 ENCOUNTER — Ambulatory Visit (INDEPENDENT_AMBULATORY_CARE_PROVIDER_SITE_OTHER): Admitting: Gastroenterology

## 2024-02-25 VITALS — BP 125/66 | HR 73 | Temp 97.5°F | Ht 71.0 in | Wt 184.6 lb

## 2024-02-25 DIAGNOSIS — K810 Acute cholecystitis: Secondary | ICD-10-CM | POA: Diagnosis not present

## 2024-02-25 DIAGNOSIS — K7460 Unspecified cirrhosis of liver: Secondary | ICD-10-CM

## 2024-02-25 DIAGNOSIS — D509 Iron deficiency anemia, unspecified: Secondary | ICD-10-CM

## 2024-02-25 DIAGNOSIS — D61818 Other pancytopenia: Secondary | ICD-10-CM | POA: Diagnosis not present

## 2024-02-25 NOTE — Progress Notes (Signed)
 Referring Provider: Shona Norleen PEDLAR, MD Primary Care Physician:  Shona Norleen PEDLAR, MD Primary GI Physician: Dr. Eartha   Chief Complaint  Patient presents with   Hospitalization Follow-up    Patient here today for a follow up from recent admission to the hospital due to an Acute cholecystitis. Patient is not currently having any abdominal pain, and denies any current issues. He has pre op with [facility] 09/09 and removal of gallbladder is scheduled for 03/27/2024.   HPI:   Stephen Burch is a 68 y.o. male with past medical history of NASH cirrhosis complicated by grade 3 esophgeal varices, Type II GOV varices, GERD complicated by Barrett's esophagus without dysplasia, hx of intraoperative cardiac arrest   Patient presenting today for:  Hospital follow up for cholecystitis Cirrhosis  IDA   Recent admission for acute cholecystitis, seen by surgery but not a surgical candidate, therefore started on IV antibiotics with improvement in symptoms and recommended referral to tertiary center for cholecystectomy. Discharged on cefpodoxime  and flagyl    MELD 3.0 on 8/6 was 14 AST 40 ALT 27, alk phos 224 T bili 2.4  INR 1.3 Plt count 140 k Hgb 11.9, WBC 3 TIBC 178 iron 74 sat 42 ferritin 552   Present:  Completed abx course. No abdominal pain, nausea or vomiting. Denies fevers or chills. Has tried to follow low sodium/low fat diet and endorses he is somewhat Scared to eat due to concern he will have pain if he eats the wrong thing. Denies ascites, LE edema. Taking lasix  and spironolactone  compliantly. No constipation or diarrhea. No episodes of confusion. He is taking an iron pill daily and has No sob or dizzinesss. Denies rectal bleeding or melena. [facility] on 9/26. Taking protonix  40mg  BID which controls his reflux fairly well.    Cirrhosis related questions: Hematemesis/coffee ground emesis: No Abdominal pain: No Abdominal distention/worsening  ascitesNo Fever/chills: No Episodes of confusion/disorientation: No Number of daily bowel movements:at least 3 bowel movements per day Taking diuretics?: Yes, furosemide  80mg  and spironolactone  200 mg qday History of variceal bleeding: No Prior history of banding?: No Prior episodes of SBP: No Last time liver imaging was performed:01/27/24 Changes of cirrhosis of the liver. 3. Poorly visualized portal vein on color Doppler, making direction of flow difficult to determine. MELD 3.0 score: as above on 8/5 14 LAST AFP: march 2025 2.5 Currently consuming alcohol: No Hepatitis A-immune Hep B not immune   Last Colonoscopy:10/2023 - The examined portion of the ileum was normal.                           - 9 non-bleeding colonic angiodysplastic lesions.                            Treated with argon plasma coagulation (APC).                           - Two 1 to 2 mm polyps in the cecum, removed with a                            cold biopsy forceps. Resected and retrieved.                           -  Four 3 to 6 mm polyps in the transverse colon and                            in the cecum, removed with a cold snare. Resected                            and retrieved.                           - Two 3 to 4 mm polyps in the sigmoid colon and in                            the descending colon, removed with a cold snare.                            Resected and retrieved.                           - Rectal varices. CECUM AND TRANSVERSE COLON, POLYPECTOMY:  Tubular adenoma, multiple fragments  Negative for high-grade dysplasia and carcinoma   B. DESCENDING AND SIGMOID COLON, POLYPECTOMY:  Compatible hyperplastic polyps  Negative for dysplasia and carcinoma    Repeat TCS 3 years     Last EGD: 11/04/20 grade III esophageal varices, Type 2 gastroesophageal varices (GOV 2 esophageal varices which extend along the fundus) w/o bleeding, single gastric polyp CT A/P with contrast: 01/26/2024 Cholelithiasis  with distended gallbladder and mild inflammatory stranding surrounding the gallbladder. Findings are concerning for acute cholecystitis. 2. Diffuse colonic wall thickening most significant in the ascending colon worrisome for colitis. 3. Diffuse wall thickening and inflammation of the duodenum compatible with duodenitis. 4. Cirrhosis with sequela of portal hypertension including splenomegaly, varices, and small volume ascites. 5. Wall thickening of the distal esophagus worrisome for esophagitis.   Filed Weights   02/25/24 1314  Weight: 184 lb 9.6 oz (83.7 kg)     Past Medical History:  Diagnosis Date   Acute systolic (congestive) heart failure (HCC) 04/30/2018   Arthritis    knees, wrists   Barrett's esophagus    Cardiac arrest (HCC) 02/22/2016   Complication of anesthesia    02-22-2016 intraop cardiac arrest immediately after vancomyocin administration , pt cardiac arrest w/ Vfib,  please refer to anesthesia record in epic   Dysrhythmia    Afib 12/2018 - converted to NSR   Esophageal varices determined by endoscopy (HCC) 11/2016   grade 1   GERD (gastroesophageal reflux disease)    Hiatal hernia    History of adenomatous polyp of colon    History of asthma    as child   History of DVT (deep vein thrombosis)    right lower extremitty post-op right total knee surgery 09/ 2010,  treated w/ coumadin   History of esophageal dilatation 07/1998   for schatzski ring   History of kidney stones    History of staph infection 04/2016   MSSA infection of right total knee w/ sepsis   Hx of cardiac arrest    02-22-2016  intraoperative, immediately following moving pt into prone position and vancomyocin administration, cardiac arrest w/ Vfib (referred to anesthesia record in epic) pt extubated himself next day   Infection of prosthetic right knee joint (HCC)  Liver cirrhosis secondary to NASH (nonalcoholic steatohepatitis) (HCC)    NAFLD-- followed by dr golda---  liver bx  09-30-2012  mild portal and focal sinusoidal fibrosis   Pre-diabetes    Pyogenic arthritis of right knee joint (HCC) 04/02/2016   Scapholunate advanced collapse of left wrist    deformity   Shock circulatory (HCC)    Staphylococcus aureus bacteremia with sepsis (HCC)    Thrombocytopenia (HCC)    10-09-2017 per pt his platelets have always been low , followed by pcp, never been referred to hematologist    Past Surgical History:  Procedure Laterality Date   APPLICATION OF WOUND VAC Right 02/22/2016   Procedure: APPLICATION OF WOUND VAC;  Surgeon: Marcey Raman, MD;  [facility]: [facility];  Service: Orthopedics;  Laterality: Right;   BIOPSY  02/10/2020   Procedure: BIOPSY;  Surgeon: golda Claudis PENNER, MD;  [facility];  Service: Endoscopy;;   CARDIAC CATHETERIZATION N/A 02/24/2016   Procedure: Left Heart Cath and Coronary Angiography;  Surgeon: Lonni JONETTA Cash, MD;  Location: [facility] INVASIVE CV LAB;  Service: Cardiovascular;  Laterality: N/A;  No angiographic evidence of CAD,  normal LVSF, ef 50-55%   CARPECTOMY Left 10/17/2017   Procedure: LEFT WRIST PROXIMAL ROW CARPECTOMY WITH POSTEROR IMBROSSEOUS NERVE EXCISION;  Surgeon: Sissy Cough, MD;  Location: [facility];  Service: Orthopedics;  Laterality: Left;  AXILLARY BLOCK   CARPECTOMY WITH RADIAL STYLOIDECTOMY Left 02/11/2019   Procedure: LEFT WRIST RADIAL STYLOIDECTOMY;  Surgeon: Sissy Cough, MD;  Location: [facility] OR;  Service: Orthopedics;  Laterality: Left;   COLONOSCOPY     COLONOSCOPY N/A 10/22/2023   Procedure: COLONOSCOPY;  Surgeon: Eartha Angelia Sieving, MD;  [facility];  Service: Gastroenterology;  Laterality: N/A;  9:45AM;ASA 3   COLONOSCOPY WITH PROPOFOL  N/A 02/10/2020   Procedure: COLONOSCOPY WITH PROPOFOL ;  Surgeon: golda Claudis PENNER, MD;  [facility];  Service: Endoscopy;  Laterality: N/A;  955   EAR CYST EXCISION Right 09/06/2014   Procedure: OPEN EXCISION BAKER'S CYST RIGHT  KNEE;  Surgeon: Marcey Raman, MD;  Location: [facility];  Service: Orthopedics;  Laterality: Right;   ESOPHAGEAL BANDING  06/30/2020   Procedure: ESOPHAGEAL BANDING;  Surgeon: golda Claudis PENNER, MD;  [facility];  Service: Endoscopy;;   ESOPHAGEAL DILATION  1996/ 2000   ESOPHAGOGASTRODUODENOSCOPY N/A 09/17/2012   Procedure: ESOPHAGOGASTRODUODENOSCOPY (EGD);  Surgeon: Claudis PENNER golda, MD;  [facility];  Service: Endoscopy;  Laterality: N/A;  200   ESOPHAGOGASTRODUODENOSCOPY N/A 12/15/2015   Procedure: ESOPHAGOGASTRODUODENOSCOPY (EGD);  Surgeon: Claudis PENNER golda, MD;  [facility];  Service: Endoscopy;  Laterality: N/A;  1245   ESOPHAGOGASTRODUODENOSCOPY N/A 12/17/2016   Procedure: ESOPHAGOGASTRODUODENOSCOPY (EGD);  Surgeon: golda Claudis PENNER, MD;  [facility];  Service: Endoscopy;  Laterality: N/A;  210   ESOPHAGOGASTRODUODENOSCOPY N/A 06/30/2020   Procedure: ESOPHAGOGASTRODUODENOSCOPY (EGD);  Surgeon: golda Claudis PENNER, MD;  [facility];  Service: Endoscopy;  Laterality: N/A;  11:15   ESOPHAGOGASTRODUODENOSCOPY (EGD) WITH PROPOFOL  N/A 02/10/2020   Procedure: ESOPHAGOGASTRODUODENOSCOPY (EGD) WITH PROPOFOL ;  Surgeon: golda Claudis PENNER, MD;  [facility];  Service: Endoscopy;  Laterality: N/A;   ESOPHAGOGASTRODUODENOSCOPY (EGD) WITH PROPOFOL  N/A 11/04/2020   Procedure: ESOPHAGOGASTRODUODENOSCOPY (EGD) WITH PROPOFOL ;  Surgeon: Eartha Angelia Sieving, MD;  [facility];  Service: Gastroenterology;  Laterality: N/A;  9:45   I & D KNEE WITH POLY EXCHANGE Right 04/02/2016   Procedure: IRRIGATION AND DEBRIDEMENT RIGHT KNEE  WITH POLY EXCHANGE;  Surgeon: Marcey Raman, MD;  Location: [facility] OR;  Service: Orthopedics;  Laterality: Right;   INCISION AND DRAINAGE HEMATOMA POST LEFT  TOTAL KNEE  2012   INGUINAL HERNIA REPAIR Bilateral 05/22/2013   Procedure: REPAIR OF RECURRENT INCARCERATED INGUINAL HERNIA WITH MESH RIGHT SIDE,  REPAIR OF RECURRENT  INGUINAL HERNIA WITH MESH LEFT SIDE;  Surgeon: Elon CHRISTELLA Pacini, MD;  Location: [facility];  Service: General;  Laterality: Bilateral;   INGUINAL HERNIA REPAIR Bilateral 11/1997   INSERTION OF MESH Bilateral 05/22/2013   Procedure: INSERTION OF MESH;  Surgeon: Elon CHRISTELLA Pacini, MD;  Location: [facility];  Service: General;  Laterality: Bilateral;   IRRIGATION AND DEBRIDEMENT KNEE Right 02/22/2016   Procedure: IRRIGATION AND DEBRIDEMENT KNEE;  Surgeon: Marcey Raman, MD;  Location: [facility];  Service: Orthopedics;  Laterality: Right;   Irrigation and Debridement right knee  03/23/2009   Dr. Cleotilde, [facility]   KNEE ARTHROSCOPY W/ SYNOVECTOMY Right 01/30/2010   AND DEBRIDEMENT OF HETEROTOPIC   LIVER BIOPSY  09/30/2012   REVISION TOTAL KNEE ARTHROPLASTY Left fall 2012   TONSILLECTOMY  child   TOTAL KNEE ARTHROPLASTY Bilateral right 09/ 2010/  left 09-18-2010   TRANSTHORACIC ECHOCARDIOGRAM  02/22/2016   ef 30-35% (per cardiac cath 02-24-2016 normal), severe hypokinesis of the mid-apicalanteroseptal and apical myocardium,  grade 1 diastolic dysfunction/ trivial PR and TR   TRIGGER FINGER RELEASE Left 02/11/2019   Procedure: LEFT WRIST STENOSING TENOSYNOVITIS RELEASE;  Surgeon: Sissy Cough, MD;  Location: [facility] OR;  Service: Orthopedics;  Laterality: Left;  MAC WITH AXILLARY BLOCK   WRIST ARTHROPLASTY Left 10/17/2017   Procedure: CAPITATE RESURFACING ARTHROPLASTY;  Surgeon: Sissy Cough, MD;  Location: [facility];  Service: Orthopedics;  Laterality: Left;    Current Outpatient Medications  Medication Sig Dispense Refill   EPINEPHrine  (PRIMATENE  MIST) 0.125 MG/ACT AERO Inhale 1 puff into the lungs daily as needed (asthma).     ferrous sulfate  (FEROSUL) 325 (65 FE) MG tablet Take 1 tablet (325 mg total) by mouth daily with breakfast. 60 tablet 5   furosemide  (LASIX ) 40 MG tablet Take 2 tablets by mouth once daily 180 tablet 0   glipiZIDE  (GLUCOTROL  XL) 10 MG 24 hr tablet Take 10 mg by mouth daily  with breakfast.     pantoprazole  (PROTONIX ) 40 MG tablet Take 1 tablet (40 mg total) by mouth 2 (two) times daily. 60 tablet 1   sildenafil (VIAGRA) 100 MG tablet Take 100 mg by mouth daily as needed for erectile dysfunction.     spironolactone  (ALDACTONE ) 100 MG tablet Take 2 tablets by mouth once daily 180 tablet 0   No current facility-administered medications for this visit.    Allergies as of 02/25/2024 - Review Complete 02/25/2024  Allergen Reaction Noted   Asa [aspirin] Shortness Of Breath 04/01/2016   Penicillins Shortness Of Breath 07/04/2012   Vancomycin  Anaphylaxis 02/22/2016    Social History   Socioeconomic History   Marital status: Legally Separated    Spouse name: Not on file   Number of children: 2   Years of education: Not on file   Highest education level: 12th grade  Occupational History    Employer: [employer]    Comment: part time  Tobacco Use   Smoking status: Former    Current packs/day: 0.00    Average packs/day: 0.3 packs/day for 3.0 years (0.8 ttl pk-yrs)    Types: Cigarettes    Start date: 10/10/1971    Quit date: 10/10/1974  Years since quitting: 49.4    Passive exposure: Past   Smokeless tobacco: Never  Vaping Use   Vaping status: Never Used  Substance and Sexual Activity   Alcohol use: Not Currently   Drug use: No   Sexual activity: Yes  Other Topics Concern   Not on file  Social History Narrative   ** Merged History Encounter **       Pt lives alone.   Social Drivers of Corporate investment banker Strain: Low Risk  (11/16/2019)   Overall Financial Resource Strain (CARDIA)    Difficulty of Paying Living Expenses: Not hard at all  Food Insecurity: No Food Insecurity (02/03/2024)   Hunger Vital Sign    Worried About Running Out of Food in the Last Year: Never true    Ran Out of Food in the Last Year: Never true  Transportation Needs: No Transportation Needs (02/03/2024)   PRAPARE - Administrator, Civil Service (Medical): No     Lack of Transportation (Non-Medical): No  Physical Activity: Inactive (11/16/2019)   Exercise Vital Sign    Days of Exercise per Week: 0 days    Minutes of Exercise per Session: 0 min  Stress: No Stress Concern Present (11/16/2019)   [employer] of Occupational Health - Occupational Stress Questionnaire    Feeling of Stress : Not at all  Social Connections: Moderately Isolated (01/27/2024)   Social Connection and Isolation Panel    Frequency of Communication with Friends and Family: More than three times a week    Frequency of Social Gatherings with Friends and Family: More than three times a week    Attends Religious Services: More than 4 times per year    Active Member of [employer] or Organizations: No    Attends Engineer, structural: Never    Marital Status: Separated    Review of systems General: negative for malaise, night sweats, fever, chills, weight loss Neck: Negative for lumps, goiter, pain and significant neck swelling Resp: Negative for cough, wheezing, dyspnea at rest CV: Negative for chest pain, leg swelling, palpitations, orthopnea GI: denies melena, hematochezia, nausea, vomiting, diarrhea, constipation, dysphagia, odyonophagia, early satiety or unintentional weight loss.  MSK: Negative for joint pain or swelling, back pain, and muscle pain. Derm: Negative for itching or rash Psych: Denies depression, anxiety, memory loss, confusion. No homicidal or suicidal ideation.  Heme: Negative for prolonged bleeding, bruising easily, and swollen nodes. Endocrine: Negative for cold or heat intolerance, polyuria, polydipsia and goiter. Neuro: negative for tremor, gait imbalance, syncope and seizures. The remainder of the review of systems is noncontributory.  Physical Exam: BP 125/66 (BP Location: Left Arm, Patient Position: Sitting, Cuff Size: Normal)   Pulse 73   Temp (!) 97.5 F (36.4 C) (Temporal)   Ht 5' 11 (1.803 m)   Wt 184 lb 9.6 oz (83.7 kg)   BMI  25.75 kg/m  General:   Alert and oriented. No distress noted. Pleasant and cooperative.  Head:  Normocephalic and atraumatic. Eyes:  Conjuctiva clear without scleral icterus. Mouth:  Oral mucosa pink and moist. Good dentition. No lesions. Heart: Normal rate and rhythm, s1 and s2 heart sounds present.  Lungs: Clear lung sounds in all lobes. Respirations equal and unlabored. Abdomen:  +BS, soft, non-tender and non-distended. No rebound or guarding. No HSM or masses noted. Derm: No palmar erythema or jaundice Msk:  Symmetrical without gross deformities. Normal posture. Extremities:  Without edema. Neurologic:  Alert and  oriented x4 Psych:  Alert and cooperative. Normal mood and affect.  Invalid input(s): 6 MONTHS   ASSESSMENT: Stephen Burch is a 68 y.o. male presenting today for hospital follow up of Cholecystitis and Cirrhosis  Acute cholecystitis: as above, recent admission with evaluation by general surgery. Unfortunately due to cirrhosis, patient deemed not a surgical candidate here at [facility], therefore treated with IV antibiotics and transitioned to PO antibiotics which he has completed. He has no abdominal pain, fevers, chills, nausea or vomiting. Has lost some weight as he has tried to make suggested dietary changes and also is concerned he may eat something that will trigger recurrence of symptoms. He has appt for GB removal with [facility] on 9/26.  Cirrhosis: recent AFP 2.5 in march. CT A/P with contrast in may during ED visit with sub capsular hypodensity in posterior let liver, 16mm, unclear if this is secondary to cirrhotic morphology or potential concerning lesion. He had follow up MRI in June without any suspicious lesions noted. He is up to date on [facility] screening with recent US  in July, as above.    In regards to his cirrhosis, he has presented with some ascites previously which is now well managed on diuretics. History of EVs/Rectal/gastric varices, did not tolerate NSBB in the past  and has refused TIPS evaluation despite multiple discussions. No previous bleeding events, no HE. Last MELD 3.0 was 14 earlier this month. Will proceed with MRI of the liver for further determination regarding liver lesion seen on CT. Will repeat AFP tumor marker next month.   IDA/pancytopenia: seeing hematology. He had recent colonoscopy as outlined above, but declined further EGD as recommended. Workup for MM negative. Low suspicion for hemolytic anemia per hematology based on their workup, however they are planning to repeat CBC, iron studies and workup for hemolysis again at the end of the year, patient maintained on PO iron daily with stable hgb and Iron, as above. No rectal bleeding or melena.    PLAN:  -AFP due next month -up to date on MELD labs and HCC screening -continue lasix  80mg  daily  -continue spironolactone  200mg  daily  -keep appt with [facility] next month -continue protonix  40mg  BID -Reduce salt intake to <2 g per day - Can take Tylenol  max of 2 g per day (650 mg q8h) for pain - Avoid NSAIDs for pain - Avoid eating raw oysters/shellfish - Ensure every night before going to sleep  All questions were answered, patient verbalized understanding and is in agreement with plan as outlined above.    Follow Up: 4 months    Porcia Morganti L. Marquiz Sotelo, MSN, APRN, AGNP-C Adult-Gerontology Nurse Practitioner [facility]  I have reviewed the note and agree with the APP's assessment as described in this progress note  Updated esophagogastroduodenospy was faxed to Dr. Umberto, who is currently following him at [facility] as part of the evaluation for possible cholecystectomy in the setting of liver cirrhosis with portal hypertension.  Toribio Fortune, MD Gastroenterology and Hepatology [facility] Gastroenterology

## 2024-02-25 NOTE — Patient Instructions (Addendum)
-  you are up to date on MELD labs and uS  of the liver  -continue lasix  80mg  daily  -continue spironolactone  200mg  daily  -keep appt with [facility] next month for gallbladder removal -continue with low sodium/low fat diet for now - Can take Tylenol  max of 2 g per day (650 mg q8h) for pain - Avoid NSAIDs for pain - Avoid eating raw oysters/shellfish - Ensure every night before going to sleep -continue protonix  40mg  twice daily -continue iron pill daily   Follow up 4 months  It was a pleasure to see you today. I want to create trusting relationships with patients and provide genuine, compassionate, and quality care. I truly value your feedback! please be on the lookout for a survey regarding your visit with me today. I appreciate your input about our visit and your time in completing this!    Jalexia Lalli L. Jaimon Bugaj, MSN, APRN, AGNP-C Adult-Gerontology Nurse Practitioner [facility] Gastroenterology at [facility]

## 2024-02-26 ENCOUNTER — Other Ambulatory Visit: Payer: Self-pay

## 2024-02-26 NOTE — Patient Instructions (Signed)
 Visit Information  Thank you for taking time to visit with me today. Please don't hesitate to contact me if I can be of assistance to you before our next scheduled telephone appointment.  Our next appointment is by telephone on 03/04/24 at 130 pm  Following is a copy of your care plan:   Goals Addressed             This Visit's Progress    VBCI Transitions of Care (TOC) Care Plan       Problems:  Recent Hospitalization for treatment of Acute Cholecystitis Knowledge Deficit Related to Acute Cholecystitis Patient reports doing good.  Pre-op scheduled for 9-9 and surgery 9/26.  He states they will be doing the surgery at [facility] and robot assisted.  Still pending with HTA coverage.  He states information giving the necessity of the surgery has been submitted and he is waiting for the response.    He states he was told by [facility] that if he has another gallbladder episode to be asked to be transferred to [facility] for care.   Goal:  Over the next 30 days, the patient will not experience hospital readmission  Interventions:  Transitions of Care: Doctor Visits  - discussed the importance of doctor visits Reviewed Signs and symptoms of infection If you have persistent or recurring pain, it's always a good idea to check with a healthcare provider about it.  Seek medical care for severe or worsening pain, or for pain accompanied by red flag symptoms, such as: Blood in your poop or vomit. High fever. Dizziness or confusion. Trouble breathing. Jaundice (yellowing of your skin and the whites of your eyes). Pain worsening with exercise. Visible swelling in your abdomen.  Patient Self Care Activities:  Attend all scheduled provider appointments Call provider office for new concerns or questions  Notify RN Care Manager of TOC call rescheduling needs Participate in Transition of Care Program/Attend TOC scheduled calls Take medications as prescribed    Plan:  The patient has been provided with contact  information for the care management team and has been advised to call with any health related questions or concerns.         Patient verbalizes understanding of instructions and care plan provided today and agrees to view in MyChart. Active MyChart status and patient understanding of how to access instructions and care plan via MyChart confirmed with patient.     The patient has been provided with contact information for the care management team and has been advised to call with any health related questions or concerns.   Please call the care guide team at [phone] if you need to cancel or reschedule your appointment.   Please call the Suicide and Crisis Lifeline: 988 if you are experiencing a Mental Health or Behavioral Health Crisis or need someone to talk to.  Jaclynn Laumann J. Lavilla Delamora RN, MSN [facility], [facility] RN Care Manager Direct Dial: [phone]  Fax: [phone] Website: [email or web address]

## 2024-02-26 NOTE — Transitions of Care (Post Inpatient/ED Visit) (Signed)
 Transition of Care week 4  Visit Note  02/26/2024  Name: Stephen Burch MRN: [id number]          DOB: 05-28-1956  Situation: Patient enrolled in [facility] 30-day program. Visit completed with patient by telephone.   Background:   Initial Transition Care Management Follow-up Telephone Call    Past Medical History:  Diagnosis Date   Acute systolic (congestive) heart failure (HCC) 04/30/2018   Arthritis    knees, wrists   Barrett's esophagus    Cardiac arrest (HCC) 02/22/2016   Complication of anesthesia    02-22-2016 intraop cardiac arrest immediately after vancomyocin administration , pt cardiac arrest w/ Vfib,  please refer to anesthesia record in epic   Dysrhythmia    Afib 12/2018 - converted to NSR   Esophageal varices determined by endoscopy (HCC) 11/2016   grade 1   GERD (gastroesophageal reflux disease)    Hiatal hernia    History of adenomatous polyp of colon    History of asthma    as child   History of DVT (deep vein thrombosis)    right lower extremitty post-op right total knee surgery 09/ 2010,  treated w/ coumadin   History of esophageal dilatation 07/1998   for schatzski ring   History of kidney stones    History of staph infection 04/2016   MSSA infection of right total knee w/ sepsis   Hx of cardiac arrest    02-22-2016  intraoperative, immediately following moving pt into prone position and vancomyocin administration, cardiac arrest w/ Vfib (referred to anesthesia record in epic) pt extubated himself next day   Infection of prosthetic right knee joint (HCC)    Liver cirrhosis secondary to NASH (nonalcoholic steatohepatitis) (HCC)    NAFLD-- followed by dr golda---  liver bx 09-30-2012  mild portal and focal sinusoidal fibrosis   Pre-diabetes    Pyogenic arthritis of right knee joint (HCC) 04/02/2016   Scapholunate advanced collapse of left wrist    deformity   Shock circulatory (HCC)    Staphylococcus aureus bacteremia with sepsis (HCC)    Thrombocytopenia  (HCC)    10-09-2017 per pt his platelets have always been low , followed by pcp, never been referred to hematologist    Assessment: Patient Reported Symptoms: Cognitive Cognitive Status: Alert and oriented to person, place, and time, Normal speech and language skills      Neurological Neurological Review of Symptoms: No symptoms reported    HEENT HEENT Symptoms Reported: No symptoms reported      Cardiovascular Cardiovascular Symptoms Reported: No symptoms reported    Respiratory Respiratory Symptoms Reported: No symptoms reported    Endocrine Endocrine Symptoms Reported: No symptoms reported Is patient diabetic?: Yes Is patient checking blood sugars at home?: Yes List most recent blood sugar readings, include date and time of day: Blood sugar running between 125-130. Endocrine Self-Management Outcome: 4 (good)  Gastrointestinal Gastrointestinal Symptoms Reported: No symptoms reported      Genitourinary Genitourinary Symptoms Reported: No symptoms reported    Integumentary Integumentary Symptoms Reported: No symptoms reported    Musculoskeletal Musculoskelatal Symptoms Reviewed: No symptoms reported        Psychosocial Psychosocial Symptoms Reported: No symptoms reported         There were no vitals filed for this visit.  Medications Reviewed Today     Reviewed by Parisha Beaulac, RN (Case Manager) on 02/26/24 at 1315  Med List Status: <None>   Medication Order Taking? Sig Documenting Provider Last Dose Status Informant  EPINEPHrine  (PRIMATENE  MIST) 0.125 MG/ACT AERO [id number] Yes Inhale 1 puff into the lungs daily as needed (asthma). [provider]  Active Self, Pharmacy Records  ferrous sulfate  (FEROSUL) 325 (65 FE) MG tablet [id number] Yes Take 1 tablet (325 mg total) by mouth daily with breakfast. Ricky Fines, MD  Active   furosemide  (LASIX ) 40 MG tablet [id number] Yes Take 2 tablets by mouth once daily Carlan, [name] L, NP  Active Self, Pharmacy Records   glipiZIDE  (GLUCOTROL  XL) 10 MG 24 hr tablet [id number] Yes Take 10 mg by mouth daily with breakfast. [provider]  Active Self, Pharmacy Records  pantoprazole  (PROTONIX ) 40 MG tablet [id number] Yes Take 1 tablet (40 mg total) by mouth 2 (two) times daily. Ricky Fines, MD  Active   sildenafil (VIAGRA) 100 MG tablet [id number] Yes Take 100 mg by mouth daily as needed for erectile dysfunction. [provider]  Active Self, Pharmacy Records  spironolactone  (ALDACTONE ) 100 MG tablet [id number] Yes Take 2 tablets by mouth once daily Carlan, Mitzie CROME, NP  Active Self, Pharmacy Records            Goals Addressed             This Visit's Progress    VBCI Transitions of Care (TOC) Care Plan       Problems:  Recent Hospitalization for treatment of Acute Cholecystitis Knowledge Deficit Related to Acute Cholecystitis Patient reports doing good.  Pre-op scheduled for 9-9 and surgery 9/26.  He states they will be doing the surgery at [facility] and robot assisted.  Still pending with HTA coverage.  He states information giving the necessity of the surgery has been submitted and he is waiting for the response.    He states he was told by [facility] that if he has another gallbladder episode to be asked to be transferred to [facility] for care.   Goal:  Over the next 30 days, the patient will not experience hospital readmission  Interventions:  Transitions of Care: Doctor Visits  - discussed the importance of doctor visits Reviewed Signs and symptoms of infection If you have persistent or recurring pain, it's always a good idea to check with a healthcare provider about it.  Seek medical care for severe or worsening pain, or for pain accompanied by red flag symptoms, such as: Blood in your poop or vomit. High fever. Dizziness or confusion. Trouble breathing. Jaundice (yellowing of your skin and the whites of your eyes). Pain worsening with exercise. Visible swelling in your  abdomen.  Patient Self Care Activities:  Attend all scheduled provider appointments Call provider office for new concerns or questions  Notify RN Care Manager of TOC call rescheduling needs Participate in Transition of Care Program/Attend TOC scheduled calls Take medications as prescribed    Plan:  The patient has been provided with contact information for the care management team and has been advised to call with any health related questions or concerns.         Recommendation:   Continue Current Plan of Care  Follow Up Plan:   Telephone follow-up in 1 week  Ajanee Buren J. Koal Eslinger RN, MSN [facility], [facility] RN Care Manager Direct Dial: [phone]  Fax: [phone] Website: [email or web address]

## 2024-03-04 ENCOUNTER — Other Ambulatory Visit: Payer: Self-pay

## 2024-03-04 NOTE — Transitions of Care (Post Inpatient/ED Visit) (Signed)
 Transition of Care Week 5  Visit Note  03/04/2024  Name: Stephen Burch MRN: [id number]          DOB: [birth year]  Situation: Patient enrolled in [facility] 30-day program. Visit completed with patient by telephone.   Background:   Initial Transition Care Management Follow-up Telephone Call    Past Medical History:  Diagnosis Date   Acute systolic (congestive) heart failure (HCC) 04/30/2018   Arthritis    knees, wrists   Barrett's esophagus    Cardiac arrest (HCC) 02/22/2016   Complication of anesthesia    02-22-2016 intraop cardiac arrest immediately after vancomyocin administration , pt cardiac arrest w/ Vfib,  please refer to anesthesia record in epic   Dysrhythmia    Afib 12/2018 - converted to NSR   Esophageal varices determined by endoscopy (HCC) 11/2016   grade 1   GERD (gastroesophageal reflux disease)    Hiatal hernia    History of adenomatous polyp of colon    History of asthma    as child   History of DVT (deep vein thrombosis)    right lower extremitty post-op right total knee surgery 09/ 2010,  treated w/ coumadin   History of esophageal dilatation 07/1998   for schatzski ring   History of kidney stones    History of staph infection 04/2016   MSSA infection of right total knee w/ sepsis   Hx of cardiac arrest    02-22-2016  intraoperative, immediately following moving pt into prone position and vancomyocin administration, cardiac arrest w/ Vfib (referred to anesthesia record in epic) pt extubated himself next day   Infection of prosthetic right knee joint (HCC)    Liver cirrhosis secondary to NASH (nonalcoholic steatohepatitis) (HCC)    NAFLD-- followed by dr golda---  liver bx 09-30-2012  mild portal and focal sinusoidal fibrosis   Pre-diabetes    Pyogenic arthritis of right knee joint (HCC) 04/02/2016   Scapholunate advanced collapse of left wrist    deformity   Shock circulatory (HCC)    Staphylococcus aureus bacteremia with sepsis (HCC)    Thrombocytopenia  (HCC)    10-09-2017 per pt his platelets have always been low , followed by pcp, never been referred to hematologist    Assessment: Patient Reported Symptoms: Cognitive Cognitive Status: Alert and oriented to person, place, and time, Normal speech and language skills      Neurological Neurological Review of Symptoms: No symptoms reported    HEENT HEENT Symptoms Reported: No symptoms reported      Cardiovascular Cardiovascular Symptoms Reported: No symptoms reported    Respiratory Respiratory Symptoms Reported: No symptoms reported    Endocrine Endocrine Symptoms Reported: No symptoms reported Is patient diabetic?: Yes Is patient checking blood sugars at home?: Yes List most recent blood sugar readings, include date and time of day: Last blood sugar 118 this am Endocrine Self-Management Outcome: 4 (good)  Gastrointestinal Gastrointestinal Symptoms Reported: No symptoms reported Additional Gastrointestinal Details: Pending gallbladder surgery      Genitourinary Genitourinary Symptoms Reported: No symptoms reported    Integumentary Integumentary Symptoms Reported: No symptoms reported    Musculoskeletal Musculoskelatal Symptoms Reviewed: No symptoms reported        Psychosocial Psychosocial Symptoms Reported: No symptoms reported         There were no vitals filed for this visit.  Medications Reviewed Today     Reviewed by Keoshia Steinmetz, RN (Case Manager) on 03/04/24 at 1340  Med List Status: <None>   Medication Order Taking? Sig  Documenting Provider Last Dose Status Informant  EPINEPHrine  (PRIMATENE  MIST) 0.125 MG/ACT AERO [id number] Yes Inhale 1 puff into the lungs daily as needed (asthma). [provider]  Active Self, Pharmacy Records  ferrous sulfate  (FEROSUL) 325 (65 FE) MG tablet [id number] Yes Take 1 tablet (325 mg total) by mouth daily with breakfast. Ricky Fines, MD  Active   furosemide  (LASIX ) 40 MG tablet [id number] Yes Take 2 tablets by mouth once  daily Carlan, [name] L, NP  Active Self, Pharmacy Records  glipiZIDE  (GLUCOTROL  XL) 10 MG 24 hr tablet [id number] Yes Take 10 mg by mouth daily with breakfast. [provider]  Active Self, Pharmacy Records  pantoprazole  (PROTONIX ) 40 MG tablet [id number] Yes Take 1 tablet (40 mg total) by mouth 2 (two) times daily. Ricky Fines, MD  Active   sildenafil (VIAGRA) 100 MG tablet [id number] Yes Take 100 mg by mouth daily as needed for erectile dysfunction. [provider]  Active Self, Pharmacy Records  spironolactone  (ALDACTONE ) 100 MG tablet [id number] Yes Take 2 tablets by mouth once daily Carlan, Mitzie CROME, NP  Active Self, Pharmacy Records            Goals Addressed             This Visit's Progress    COMPLETED: VBCI Transitions of Care (TOC) Care Plan       Problems:  Recent Hospitalization for treatment of Acute Cholecystitis Knowledge Deficit Related to Acute Cholecystitis Patient reports doing good.  Reports he has some fever on yesterday but today fine.  He denies any problems today.  Advised that if fever returns to notify PCP.  He verbalized understanding.  Surgery still scheduled with Pre-op scheduled for 9-9 and surgery 9/26.     Goal:  Over the next 30 days, the patient will not experience hospital readmission  Interventions:  Transitions of Care: Doctor Visits  - discussed the importance of doctor visits Reviewed Signs and symptoms of infection If you have persistent or recurring pain, it's always a good idea to check with a healthcare provider about it.  Seek medical care for severe or worsening pain, or for pain accompanied by red flag symptoms, such as: Blood in your poop or vomit. High fever. Dizziness or confusion. Trouble breathing. Jaundice (yellowing of your skin and the whites of your eyes). Pain worsening with exercise. Visible swelling in your abdomen.  Patient Self Care Activities:  Attend all scheduled provider appointments Call  provider office for new concerns or questions  Notify RN Care Manager of TOC call rescheduling needs Participate in Transition of Care Program/Attend TOC scheduled calls Take medications as prescribed    Plan:  The patient has been provided with contact information for the care management team and has been advised to call with any health related questions or concerns.         Recommendation:   Patient met goals closing program  Follow Up Plan:   Closing From:  Transitions of Care Program  Nakhia Levitan DOROTHA Seeds RN, MSN [place]  [facility], [facility] RN Care Manager Direct Dial: [phone]  Fax: [phone] Website: [email or web address]

## 2024-03-04 NOTE — Patient Instructions (Signed)
 Visit Information  Thank you for taking time to visit with me today. Please don't hesitate to contact me if I can be of assistance to you   Following is a copy of your care plan:   Goals Addressed             This Visit's Progress    COMPLETED: VBCI Transitions of Care (TOC) Care Plan       Problems:  Recent Hospitalization for treatment of Acute Cholecystitis Knowledge Deficit Related to Acute Cholecystitis Patient reports doing good.  Reports he has some fever on yesterday but today fine.  He denies any problems today.  Advised that if fever returns to notify PCP.  He verbalized understanding.  Surgery still scheduled with Pre-op scheduled for 9-9 and surgery 9/26.     Goal:  Over the next 30 days, the patient will not experience hospital readmission  Interventions:  Transitions of Care: Doctor Visits  - discussed the importance of doctor visits Reviewed Signs and symptoms of infection If you have persistent or recurring pain, it's always a good idea to check with a healthcare provider about it.  Seek medical care for severe or worsening pain, or for pain accompanied by red flag symptoms, such as: Blood in your poop or vomit. High fever. Dizziness or confusion. Trouble breathing. Jaundice (yellowing of your skin and the whites of your eyes). Pain worsening with exercise. Visible swelling in your abdomen.  Patient Self Care Activities:  Attend all scheduled provider appointments Call provider office for new concerns or questions  Notify RN Care Manager of TOC call rescheduling needs Participate in Transition of Care Program/Attend TOC scheduled calls Take medications as prescribed    Plan:  The patient has been provided with contact information for the care management team and has been advised to call with any health related questions or concerns.         Patient verbalizes understanding of instructions and care plan provided today and agrees to view in MyChart.  Active MyChart status and patient understanding of how to access instructions and care plan via MyChart confirmed with patient.     The patient has been provided with contact information for the care management team and has been advised to call with any health related questions or concerns.   Please call the care guide team at [phone] if you need to cancel or reschedule your appointment.   Please call the Suicide and Crisis Lifeline: 988 if you are experiencing a Mental Health or Behavioral Health Crisis or need someone to talk to.  Armon Orvis J. Angellina Ferdinand RN, MSN [facility], [facility] RN Care Manager Direct Dial: [phone]  Fax: [phone] Website: [email or web address]

## 2024-03-10 DIAGNOSIS — I8500 Esophageal varices without bleeding: Secondary | ICD-10-CM | POA: Diagnosis not present

## 2024-03-10 DIAGNOSIS — E119 Type 2 diabetes mellitus without complications: Secondary | ICD-10-CM | POA: Diagnosis not present

## 2024-03-10 DIAGNOSIS — Z01818 Encounter for other preprocedural examination: Secondary | ICD-10-CM | POA: Diagnosis not present

## 2024-03-10 DIAGNOSIS — K7469 Other cirrhosis of liver: Secondary | ICD-10-CM | POA: Diagnosis not present

## 2024-03-10 DIAGNOSIS — D649 Anemia, unspecified: Secondary | ICD-10-CM | POA: Diagnosis not present

## 2024-03-10 DIAGNOSIS — Z8679 Personal history of other diseases of the circulatory system: Secondary | ICD-10-CM | POA: Diagnosis not present

## 2024-03-10 DIAGNOSIS — K810 Acute cholecystitis: Secondary | ICD-10-CM | POA: Diagnosis not present

## 2024-03-16 ENCOUNTER — Ambulatory Visit (INDEPENDENT_AMBULATORY_CARE_PROVIDER_SITE_OTHER): Admitting: Gastroenterology

## 2024-03-27 DIAGNOSIS — E119 Type 2 diabetes mellitus without complications: Secondary | ICD-10-CM | POA: Diagnosis not present

## 2024-03-27 DIAGNOSIS — K7460 Unspecified cirrhosis of liver: Secondary | ICD-10-CM | POA: Diagnosis not present

## 2024-03-27 DIAGNOSIS — I110 Hypertensive heart disease with heart failure: Secondary | ICD-10-CM | POA: Diagnosis not present

## 2024-03-27 DIAGNOSIS — R188 Other ascites: Secondary | ICD-10-CM | POA: Diagnosis not present

## 2024-03-27 DIAGNOSIS — I10 Essential (primary) hypertension: Secondary | ICD-10-CM | POA: Diagnosis not present

## 2024-03-27 DIAGNOSIS — K219 Gastro-esophageal reflux disease without esophagitis: Secondary | ICD-10-CM | POA: Diagnosis not present

## 2024-03-27 DIAGNOSIS — R768 Other specified abnormal immunological findings in serum: Secondary | ICD-10-CM | POA: Diagnosis not present

## 2024-03-27 DIAGNOSIS — K766 Portal hypertension: Secondary | ICD-10-CM | POA: Diagnosis not present

## 2024-03-27 DIAGNOSIS — K660 Peritoneal adhesions (postprocedural) (postinfection): Secondary | ICD-10-CM | POA: Diagnosis not present

## 2024-03-27 DIAGNOSIS — Z8679 Personal history of other diseases of the circulatory system: Secondary | ICD-10-CM | POA: Diagnosis not present

## 2024-03-27 DIAGNOSIS — Z8709 Personal history of other diseases of the respiratory system: Secondary | ICD-10-CM | POA: Diagnosis not present

## 2024-03-27 DIAGNOSIS — K7581 Nonalcoholic steatohepatitis (NASH): Secondary | ICD-10-CM | POA: Diagnosis not present

## 2024-03-27 DIAGNOSIS — K7469 Other cirrhosis of liver: Secondary | ICD-10-CM | POA: Diagnosis not present

## 2024-03-27 DIAGNOSIS — E1100 Type 2 diabetes mellitus with hyperosmolarity without nonketotic hyperglycemic-hyperosmolar coma (NKHHC): Secondary | ICD-10-CM | POA: Diagnosis not present

## 2024-03-27 DIAGNOSIS — Z880 Allergy status to penicillin: Secondary | ICD-10-CM | POA: Diagnosis not present

## 2024-03-27 DIAGNOSIS — K810 Acute cholecystitis: Secondary | ICD-10-CM | POA: Diagnosis not present

## 2024-03-27 DIAGNOSIS — Z881 Allergy status to other antibiotic agents status: Secondary | ICD-10-CM | POA: Diagnosis not present

## 2024-03-27 DIAGNOSIS — D649 Anemia, unspecified: Secondary | ICD-10-CM | POA: Diagnosis not present

## 2024-03-27 DIAGNOSIS — I509 Heart failure, unspecified: Secondary | ICD-10-CM | POA: Diagnosis not present

## 2024-03-27 DIAGNOSIS — K812 Acute cholecystitis with chronic cholecystitis: Secondary | ICD-10-CM | POA: Diagnosis not present

## 2024-03-27 DIAGNOSIS — Z886 Allergy status to analgesic agent status: Secondary | ICD-10-CM | POA: Diagnosis not present

## 2024-04-04 ENCOUNTER — Emergency Department (HOSPITAL_COMMUNITY)
Admission: EM | Admit: 2024-04-04 | Discharge: 2024-04-04 | Disposition: A | Discharge status: Home / Self Care | Attending: Emergency Medicine | Admitting: Emergency Medicine

## 2024-04-04 ENCOUNTER — Emergency Department (HOSPITAL_COMMUNITY)

## 2024-04-04 ENCOUNTER — Other Ambulatory Visit: Payer: Self-pay

## 2024-04-04 ENCOUNTER — Encounter (HOSPITAL_COMMUNITY): Payer: Self-pay

## 2024-04-04 DIAGNOSIS — K7460 Unspecified cirrhosis of liver: Secondary | ICD-10-CM | POA: Diagnosis not present

## 2024-04-04 DIAGNOSIS — J45909 Unspecified asthma, uncomplicated: Secondary | ICD-10-CM | POA: Insufficient documentation

## 2024-04-04 DIAGNOSIS — R1084 Generalized abdominal pain: Secondary | ICD-10-CM | POA: Diagnosis not present

## 2024-04-04 DIAGNOSIS — R066 Hiccough: Secondary | ICD-10-CM | POA: Diagnosis not present

## 2024-04-04 DIAGNOSIS — R109 Unspecified abdominal pain: Secondary | ICD-10-CM | POA: Diagnosis not present

## 2024-04-04 DIAGNOSIS — R7989 Other specified abnormal findings of blood chemistry: Secondary | ICD-10-CM | POA: Diagnosis not present

## 2024-04-04 DIAGNOSIS — R161 Splenomegaly, not elsewhere classified: Secondary | ICD-10-CM | POA: Diagnosis not present

## 2024-04-04 DIAGNOSIS — R188 Other ascites: Secondary | ICD-10-CM | POA: Diagnosis not present

## 2024-04-04 LAB — COMPREHENSIVE METABOLIC PANEL WITH GFR
ALT: 29 U/L (ref 0–44)
AST: 32 U/L (ref 15–41)
Albumin: 3.7 g/dL (ref 3.5–5.0)
Alkaline Phosphatase: 215 U/L — ABNORMAL HIGH (ref 38–126)
Anion gap: 9 (ref 5–15)
BUN: 13 mg/dL (ref 8–23)
CO2: 25 mmol/L (ref 22–32)
Calcium: 8.4 mg/dL — ABNORMAL LOW (ref 8.9–10.3)
Chloride: 97 mmol/L — ABNORMAL LOW (ref 98–111)
Creatinine, Ser: 1.08 mg/dL (ref 0.61–1.24)
GFR, Estimated: >60 mL/min (ref >60)
Glucose, Bld: 116 mg/dL — ABNORMAL HIGH (ref 70–99)
Potassium: 4.4 mmol/L (ref 3.5–5.1)
Sodium: 132 mmol/L — ABNORMAL LOW (ref 135–145)
Total Bilirubin: 1.4 mg/dL — ABNORMAL HIGH (ref 0.0–1.2)
Total Protein: 6.1 g/dL — ABNORMAL LOW (ref 6.5–8.1)

## 2024-04-04 LAB — CBC WITH DIFFERENTIAL/PLATELET
Abs Immature Granulocytes: 0.01 K/uL (ref 0.00–0.07)
Basophils Absolute: 0 K/uL (ref 0.0–0.1)
Basophils Relative: 1 %
Eosinophils Absolute: 0.4 K/uL (ref 0.0–0.5)
Eosinophils Relative: 11 %
HCT: 37.6 % — ABNORMAL LOW (ref 39.0–52.0)
Hemoglobin: 13.2 g/dL (ref 13.0–17.0)
Immature Granulocytes: 0 %
Lymphocytes Relative: 9 %
Lymphs Abs: 0.4 K/uL — ABNORMAL LOW (ref 0.7–4.0)
MCH: 31.4 pg (ref 26.0–34.0)
MCHC: 35.1 g/dL (ref 30.0–36.0)
MCV: 89.3 fL (ref 80.0–100.0)
Monocytes Absolute: 0.5 K/uL (ref 0.1–1.0)
Monocytes Relative: 13 %
Neutro Abs: 2.6 K/uL (ref 1.7–7.7)
Neutrophils Relative %: 66 %
Platelets: 112 K/uL — ABNORMAL LOW (ref 150–400)
RBC: 4.21 MIL/uL — ABNORMAL LOW (ref 4.22–5.81)
RDW: 13.4 % (ref 11.5–15.5)
WBC: 3.9 K/uL — ABNORMAL LOW (ref 4.0–10.5)
nRBC: 0 % (ref 0.0–0.2)

## 2024-04-04 LAB — URINALYSIS, ROUTINE W REFLEX MICROSCOPIC
Bilirubin Urine: NEGATIVE
Glucose, UA: NEGATIVE mg/dL
Hgb urine dipstick: NEGATIVE
Ketones, ur: NEGATIVE mg/dL
Leukocytes,Ua: NEGATIVE
Nitrite: NEGATIVE
Protein, ur: NEGATIVE mg/dL
Specific Gravity, Urine: 1.004 — ABNORMAL LOW (ref 1.005–1.030)
pH: 7 (ref 5.0–8.0)

## 2024-04-04 LAB — PROTIME-INR
INR: 1.2 (ref 0.8–1.2)
Prothrombin Time: 16.3 s — ABNORMAL HIGH (ref 11.4–15.2)

## 2024-04-04 LAB — AMMONIA: Ammonia: 43 umol/L — ABNORMAL HIGH (ref 9–35)

## 2024-04-04 LAB — LIPASE, BLOOD: Lipase: 63 U/L — ABNORMAL HIGH (ref 11–51)

## 2024-04-04 MED ORDER — IOHEXOL 300 MG/ML  SOLN
100.0000 mL | Freq: Once | INTRAMUSCULAR | Status: AC | PRN
  Administered 2024-04-04: 100 mL via INTRAVENOUS

## 2024-04-04 MED ORDER — CHLORPROMAZINE HCL 25 MG PO TABS
25.0000 mg | ORAL_TABLET | Freq: Three times a day (TID) | ORAL | 0 refills | Status: DC | PRN
Start: 2024-04-04 — End: 2024-05-21

## 2024-04-04 MED ORDER — SODIUM CHLORIDE 0.9 % IV BOLUS
1000.0000 mL | Freq: Once | INTRAVENOUS | Status: AC
  Administered 2024-04-04: 1000 mL via INTRAVENOUS

## 2024-04-04 MED ORDER — PANTOPRAZOLE SODIUM 40 MG IV SOLR
40.0000 mg | Freq: Once | INTRAVENOUS | Status: AC
  Administered 2024-04-04: 40 mg via INTRAVENOUS
  Filled 2024-04-04: qty 10

## 2024-04-04 MED ORDER — CHLORPROMAZINE HCL 25 MG PO TABS
25.0000 mg | ORAL_TABLET | Freq: Once | ORAL | Status: AC
  Administered 2024-04-04: 25 mg via ORAL
  Filled 2024-04-04: qty 1

## 2024-04-04 MED ORDER — ALUM & MAG HYDROXIDE-SIMETH 200-200-20 MG/5ML PO SUSP
30.0000 mL | Freq: Once | ORAL | Status: AC
  Administered 2024-04-04: 30 mL via ORAL
  Filled 2024-04-04: qty 30

## 2024-04-04 NOTE — ED Provider Notes (Signed)
 [place] [facility] AT [facility] Provider Note   CSN: [id number] Arrival date & time: 04/04/24  1840     Patient presents with: Abdominal Pain and Hiccups   Stephen Burch is a 68 y.o. male.   Pt is a 68 yo male with pmhx significant for gerd, thrombocytopenia, asthma, kidney stones, dvt, cirrhosis due to NASH, and hx vfib arrest after vancomycin  administration.  Pt was admitted to [facility] from 9/26-9/27 for acute cholecystitis and had a cholecystectomy.  Pt did have a drain placed during the operation, but this was removed prior to d/c.  Pt has had hiccups since the procedure.  He also has redness to his abd skin and he's leaking at one of his surgical sites.  No fever.  No n/v.       Prior to Admission medications   Medication Sig Start Date End Date Taking? Authorizing Provider  chlorproMAZINE  (THORAZINE ) 25 MG tablet Take 1 tablet (25 mg total) by mouth 3 (three) times daily as needed for hiccoughs. 04/04/24  Yes Dean Clarity, MD  EPINEPHrine  (PRIMATENE  MIST) 0.125 MG/ACT AERO Inhale 1 puff into the lungs daily as needed (asthma).    [provider]  ferrous sulfate  (FEROSUL) 325 (65 FE) MG tablet Take 1 tablet (325 mg total) by mouth daily with breakfast. 02/10/24   Ricky Fines, MD  furosemide  (LASIX ) 40 MG tablet Take 2 tablets by mouth once daily 11/21/23   Carlan, [name] L, NP  glipiZIDE  (GLUCOTROL  XL) 10 MG 24 hr tablet Take 10 mg by mouth daily with breakfast.    [provider]  pantoprazole  (PROTONIX ) 40 MG tablet Take 1 tablet (40 mg total) by mouth 2 (two) times daily. 01/31/24 03/31/24  Ricky Fines, MD  sildenafil (VIAGRA) 100 MG tablet Take 100 mg by mouth daily as needed for erectile dysfunction.    [provider]  spironolactone  (ALDACTONE ) 100 MG tablet Take 2 tablets by mouth once daily 11/20/23   Carlan, [name] L, NP    Allergies: Asa [aspirin], Penicillins, and Vancomycin     Review of Systems   Gastrointestinal:  Positive for abdominal distention.       Hiccups  All other systems reviewed and are negative.   Updated Vital Signs BP 109/61   Pulse 83   Temp 99 F (37.2 C) (Oral)   Resp 17   Ht 5' 11 (1.803 m)   Wt 82.6 kg   SpO2 95%   BMI 25.38 kg/m   Physical Exam Vitals and nursing note reviewed.  Constitutional:      Appearance: He is well-developed.  HENT:     Head: Normocephalic and atraumatic.     Mouth/Throat:     Mouth: Mucous membranes are moist.     Pharynx: Oropharynx is clear.  Eyes:     Extraocular Movements: Extraocular movements intact.     Pupils: Pupils are equal, round, and reactive to light.  Cardiovascular:     Rate and Rhythm: Normal rate and regular rhythm.     Heart sounds: Normal heart sounds.  Pulmonary:     Effort: Pulmonary effort is normal.     Breath sounds: Normal breath sounds.  Abdominal:     Palpations: There is fluid wave.     Tenderness: There is generalized abdominal tenderness.     Comments: Abd wall redness Pt does have some clear fluid draining from one of the surgical sites.  Skin:    General: Skin is warm.     Capillary  Refill: Capillary refill takes less than 2 seconds.  Neurological:     General: No focal deficit present.     Mental Status: He is alert and oriented to person, place, and time.  Psychiatric:        Mood and Affect: Mood normal.        Behavior: Behavior normal.     (all labs ordered are listed, but only abnormal results are displayed) Labs Reviewed  CBC WITH DIFFERENTIAL/PLATELET - Abnormal; Notable for the following components:      Result Value   WBC 3.9 (*)    RBC 4.21 (*)    HCT 37.6 (*)    Platelets 112 (*)    Lymphs Abs 0.4 (*)    All other components within normal limits  COMPREHENSIVE METABOLIC PANEL WITH GFR - Abnormal; Notable for the following components:   Sodium 132 (*)    Chloride 97 (*)    Glucose, Bld 116 (*)    Calcium 8.4 (*)    Total Protein 6.1 (*)    Alkaline  Phosphatase 215 (*)    Total Bilirubin 1.4 (*)    All other components within normal limits  LIPASE, BLOOD - Abnormal; Notable for the following components:   Lipase 63 (*)    All other components within normal limits  URINALYSIS, ROUTINE W REFLEX MICROSCOPIC - Abnormal; Notable for the following components:   Specific Gravity, Urine 1.004 (*)    All other components within normal limits  AMMONIA - Abnormal; Notable for the following components:   Ammonia 43 (*)    All other components within normal limits  PROTIME-INR - Abnormal; Notable for the following components:   Prothrombin Time 16.3 (*)    All other components within normal limits    EKG: None  Radiology: CT ABDOMEN PELVIS W CONTRAST Result Date: 04/04/2024 CLINICAL DATA:  Abdominal pain, cholecystectomy last Friday, patient reports leaking at incision site EXAM: CT ABDOMEN AND PELVIS WITH CONTRAST TECHNIQUE: Multidetector CT imaging of the abdomen and pelvis was performed using the standard protocol following bolus administration of intravenous contrast. RADIATION DOSE REDUCTION: This exam was performed according to the departmental dose-optimization program which includes automated exposure control, adjustment of the mA and/or kV according to patient size and/or use of iterative reconstruction technique. CONTRAST:  OMNIPAQUE  IOHEXOL  300 MG/ML  SOLN COMPARISON:  01/26/2024 FINDINGS: Lower chest: No acute abnormality. Hepatobiliary: No solid liver abnormality is seen. Cholecystectomy. Simple attenuation fluid collection in the gallbladder fossa measuring 4.5 x 3.5 cm (series 2, image 19). Pancreas: Unremarkable. No pancreatic ductal dilatation or surrounding inflammatory changes. Spleen: Splenomegaly, maximum span 18.4 cm. Adrenals/Urinary Tract: Adrenal glands are unremarkable. Kidneys are normal, without renal calculi, solid lesion, or hydronephrosis. Bladder is unremarkable. Stomach/Bowel: Stomach is within normal limits.  Appendix appears normal. No evidence of bowel wall thickening, distention, or inflammatory changes. Large burden of stool and stool balls throughout the colon and rectum. Vascular/Lymphatic: Aortic atherosclerosis. Small gastroesophageal varices. No enlarged abdominal or pelvic lymph nodes. Reproductive: Mild prostatomegaly. Large left varicocele (series 2, image 113). Other: No abdominal wall hernia. Anasarca. Evidence of laparoscopic access of the umbilicus. No focal fluid collection. Trace ascites. Musculoskeletal: No acute or significant osseous findings. IMPRESSION: 1. Status post cholecystectomy. Simple attenuation fluid collection in the gallbladder fossa measuring 4.5 x 3.5 cm. This could reflect postoperative seroma or biloma. Presence or absence of infection is not established by imaging. 2. Evidence of laparoscopic access of the umbilicus. No focal fluid collection. 3. Large  burden of stool and stool balls throughout the colon and rectum. 4. Cirrhosis, splenomegaly and small gastroesophageal varices. Trace ascites. 5. Anasarca. 6. Large left varicocele. Aortic Atherosclerosis (ICD10-I70.0). Electronically Signed   By: Marolyn JONETTA Jaksch M.D.   On: 04/04/2024 20:43     Procedures   Medications Ordered in the ED  sodium chloride  0.9 % bolus 1,000 mL (0 mLs Intravenous Stopped 04/04/24 2011)  pantoprazole  (PROTONIX ) injection 40 mg (40 mg Intravenous Given 04/04/24 2100)  iohexol  (OMNIPAQUE ) 300 MG/ML solution 100 mL (100 mLs Intravenous Contrast Given 04/04/24 2014)  alum & mag hydroxide-simeth (MAALOX/MYLANTA) 200-200-20 MG/5ML suspension 30 mL (30 mLs Oral Given 04/04/24 2200)  chlorproMAZINE  (THORAZINE ) tablet 25 mg (25 mg Oral Given 04/04/24 2200)                                    Medical Decision Making Amount and/or Complexity of Data Reviewed Labs: ordered. Radiology: ordered.  Risk OTC drugs. Prescription drug management.   This patient presents to the ED for concern of hiccups/abd  pain, this involves an extensive number of treatment options, and is a complaint that carries with it a high risk of complications and morbidity.  The differential diagnosis includes post op abscess, cellulitis   Co morbidities that complicate the patient evaluation  gerd, thrombocytopenia, asthma, kidney stones, dvt, cirrhosis due to NASH, and hx vfib arrest after vancomycin  administration   Additional history obtained:  Additional history obtained from epic chart review External records from outside source obtained and reviewed including family   Lab Tests:  I Ordered, and personally interpreted labs.  The pertinent results include:  cbc with wbc low at 3.9, plt low at 112 (chronic); cmp with ap elevated at 215 and tb sl elevated at 1.4 (improved); lip elevated at 63; ua neg; ammonia elevated at 43 (improved)   Imaging Studies ordered:  I ordered imaging studies including ct abd/pelvis  I independently visualized and interpreted imaging which showed  1. Status post cholecystectomy. Simple attenuation fluid collection  in the gallbladder fossa measuring 4.5 x 3.5 cm. This could reflect  postoperative seroma or biloma. Presence or absence of infection is  not established by imaging.  2. Evidence of laparoscopic access of the umbilicus. No focal fluid  collection.  3. Large burden of stool and stool balls throughout the colon and  rectum.  4. Cirrhosis, splenomegaly and small gastroesophageal varices. Trace  ascites.  5. Anasarca.  6. Large left varicocele.    Aortic Atherosclerosis (ICD10-I70.0).   I agree with the radiologist interpretation   Cardiac Monitoring:  The patient was maintained on a cardiac monitor.  I personally viewed and interpreted the cardiac monitored which showed an underlying rhythm of: nsr   Medicines ordered and prescription drug management:  I ordered medication including ivfs/protonix   for sx  Reevaluation of the patient after these medicines  showed that the patient improved I have reviewed the patients home medicines and have made adjustments as needed   Test Considered:  ct   Consultations Obtained:  I requested consultation with the surgeon (Dr. Mavis),  and discussed lab and imaging findings as well as pertinent plan -he feels the fluid collection is likely normal post-op  Problem List / ED Course:  Hiccups:  now gone with tx.  Likely gerd. Leaking from surgical port:  likely ascites fluid.  Pt is to f/u with his surgeon.  Return if worse.  Reevaluation:  After the interventions noted above, I reevaluated the patient and found that they have :improved   Social Determinants of Health:  Lives at home   Dispostion:  After consideration of the diagnostic results and the patients response to treatment, I feel that the patent would benefit from discharge with outpatient f/u.       Final diagnoses:  Hiccups    ED Discharge Orders          Ordered    chlorproMAZINE  (THORAZINE ) 25 MG tablet  3 times daily PRN        04/04/24 2307               Dean Clarity, MD 04/04/24 2323

## 2024-04-04 NOTE — ED Triage Notes (Addendum)
 Pt had gallbladder removal last Friday and complains of abdominal pain and leaking at the site, as well as having hiccups for 8 days. Leaking and redness noted to surgical incision on right side.

## 2024-04-06 ENCOUNTER — Telehealth (INDEPENDENT_AMBULATORY_CARE_PROVIDER_SITE_OTHER): Payer: Self-pay

## 2024-04-06 ENCOUNTER — Ambulatory Visit: Admitting: Urology

## 2024-04-06 ENCOUNTER — Other Ambulatory Visit (INDEPENDENT_AMBULATORY_CARE_PROVIDER_SITE_OTHER): Payer: Self-pay

## 2024-04-06 ENCOUNTER — Encounter (INDEPENDENT_AMBULATORY_CARE_PROVIDER_SITE_OTHER): Payer: Self-pay

## 2024-04-06 DIAGNOSIS — I864 Gastric varices: Secondary | ICD-10-CM

## 2024-04-06 DIAGNOSIS — I8510 Secondary esophageal varices without bleeding: Secondary | ICD-10-CM

## 2024-04-06 NOTE — Telephone Encounter (Signed)
 Patient says he could not recall if they told him a dosage or not.

## 2024-04-06 NOTE — Telephone Encounter (Signed)
 I spoke with the patient and made him aware per [facility], We can have him increase to 250 mg of spironolactone  and 100mg  lasix  daily. He should be able to break each pill in half so 2.5 tablets of spironolactone  and 2.5 tablets of lasix . We need to repeat a CMP 1 week after he increases this. It looks like he has follow up on 10/8 with surgeon possibly? He needs to keep this. Patient states understanding and aware to have labs drawn at [facility] one week from starting this. Orders placed for patient to have a CMP at [facility].

## 2024-04-06 NOTE — Telephone Encounter (Signed)
 I made the Changes on patient Medication list.

## 2024-04-06 NOTE — Telephone Encounter (Signed)
 Patient says he has extra fluid on his stomach and the sight of his gallbladder removal was leaking fluid, and Dr. Kip who did the surgery asked the patient to call here to see if we would increase the patient's Spironolactone  from 200 mg per day and also increase the patient's Furosemide  from 80 mg daily. Please advise.

## 2024-04-07 ENCOUNTER — Other Ambulatory Visit (INDEPENDENT_AMBULATORY_CARE_PROVIDER_SITE_OTHER): Payer: Self-pay | Admitting: Gastroenterology

## 2024-04-07 DIAGNOSIS — E8770 Fluid overload, unspecified: Secondary | ICD-10-CM | POA: Diagnosis not present

## 2024-04-07 DIAGNOSIS — Z9049 Acquired absence of other specified parts of digestive tract: Secondary | ICD-10-CM | POA: Diagnosis not present

## 2024-04-07 DIAGNOSIS — R188 Other ascites: Secondary | ICD-10-CM | POA: Diagnosis not present

## 2024-04-07 DIAGNOSIS — K7460 Unspecified cirrhosis of liver: Secondary | ICD-10-CM | POA: Diagnosis not present

## 2024-04-07 DIAGNOSIS — R601 Generalized edema: Secondary | ICD-10-CM | POA: Diagnosis not present

## 2024-04-14 DIAGNOSIS — R188 Other ascites: Secondary | ICD-10-CM | POA: Diagnosis not present

## 2024-04-14 DIAGNOSIS — R601 Generalized edema: Secondary | ICD-10-CM | POA: Diagnosis not present

## 2024-04-14 DIAGNOSIS — K7460 Unspecified cirrhosis of liver: Secondary | ICD-10-CM | POA: Diagnosis not present

## 2024-04-15 ENCOUNTER — Encounter (INDEPENDENT_AMBULATORY_CARE_PROVIDER_SITE_OTHER): Payer: Self-pay | Admitting: Gastroenterology

## 2024-05-14 ENCOUNTER — Inpatient Hospital Stay: Attending: Hematology

## 2024-05-14 DIAGNOSIS — Z8674 Personal history of sudden cardiac arrest: Secondary | ICD-10-CM | POA: Diagnosis not present

## 2024-05-14 DIAGNOSIS — D61818 Other pancytopenia: Secondary | ICD-10-CM | POA: Diagnosis not present

## 2024-05-14 DIAGNOSIS — Z860101 Personal history of adenomatous and serrated colon polyps: Secondary | ICD-10-CM | POA: Diagnosis not present

## 2024-05-14 DIAGNOSIS — Z8679 Personal history of other diseases of the circulatory system: Secondary | ICD-10-CM | POA: Diagnosis not present

## 2024-05-14 DIAGNOSIS — Z8261 Family history of arthritis: Secondary | ICD-10-CM | POA: Diagnosis not present

## 2024-05-14 DIAGNOSIS — R188 Other ascites: Secondary | ICD-10-CM | POA: Diagnosis not present

## 2024-05-14 DIAGNOSIS — Z8619 Personal history of other infectious and parasitic diseases: Secondary | ICD-10-CM | POA: Diagnosis not present

## 2024-05-14 DIAGNOSIS — D509 Iron deficiency anemia, unspecified: Secondary | ICD-10-CM

## 2024-05-14 DIAGNOSIS — Z9049 Acquired absence of other specified parts of digestive tract: Secondary | ICD-10-CM | POA: Diagnosis not present

## 2024-05-14 DIAGNOSIS — Z635 Disruption of family by separation and divorce: Secondary | ICD-10-CM | POA: Diagnosis not present

## 2024-05-14 DIAGNOSIS — Z886 Allergy status to analgesic agent status: Secondary | ICD-10-CM | POA: Diagnosis not present

## 2024-05-14 DIAGNOSIS — Z8249 Family history of ischemic heart disease and other diseases of the circulatory system: Secondary | ICD-10-CM | POA: Diagnosis not present

## 2024-05-14 DIAGNOSIS — Z87442 Personal history of urinary calculi: Secondary | ICD-10-CM | POA: Diagnosis not present

## 2024-05-14 DIAGNOSIS — Z86718 Personal history of other venous thrombosis and embolism: Secondary | ICD-10-CM | POA: Diagnosis not present

## 2024-05-14 DIAGNOSIS — Z8719 Personal history of other diseases of the digestive system: Secondary | ICD-10-CM | POA: Diagnosis not present

## 2024-05-14 DIAGNOSIS — Z87891 Personal history of nicotine dependence: Secondary | ICD-10-CM | POA: Diagnosis not present

## 2024-05-14 DIAGNOSIS — E119 Type 2 diabetes mellitus without complications: Secondary | ICD-10-CM | POA: Diagnosis not present

## 2024-05-14 DIAGNOSIS — Z9089 Acquired absence of other organs: Secondary | ICD-10-CM | POA: Diagnosis not present

## 2024-05-14 DIAGNOSIS — Z809 Family history of malignant neoplasm, unspecified: Secondary | ICD-10-CM | POA: Diagnosis not present

## 2024-05-14 DIAGNOSIS — Z880 Allergy status to penicillin: Secondary | ICD-10-CM | POA: Diagnosis not present

## 2024-05-14 DIAGNOSIS — K7460 Unspecified cirrhosis of liver: Secondary | ICD-10-CM | POA: Diagnosis not present

## 2024-05-14 DIAGNOSIS — K7581 Nonalcoholic steatohepatitis (NASH): Secondary | ICD-10-CM | POA: Diagnosis not present

## 2024-05-14 DIAGNOSIS — R140 Abdominal distension (gaseous): Secondary | ICD-10-CM | POA: Diagnosis not present

## 2024-05-14 DIAGNOSIS — Z881 Allergy status to other antibiotic agents status: Secondary | ICD-10-CM | POA: Diagnosis not present

## 2024-05-14 DIAGNOSIS — Z79899 Other long term (current) drug therapy: Secondary | ICD-10-CM | POA: Diagnosis not present

## 2024-05-14 LAB — CBC WITH DIFFERENTIAL/PLATELET
Abs Immature Granulocytes: 0.01 K/uL (ref 0.00–0.07)
Basophils Absolute: 0 K/uL (ref 0.0–0.1)
Basophils Relative: 1 %
Eosinophils Absolute: 0.2 K/uL (ref 0.0–0.5)
Eosinophils Relative: 8 %
HCT: 40.1 % (ref 39.0–52.0)
Hemoglobin: 13.7 g/dL (ref 13.0–17.0)
Immature Granulocytes: 0 %
Lymphocytes Relative: 10 %
Lymphs Abs: 0.3 K/uL — ABNORMAL LOW (ref 0.7–4.0)
MCH: 30.9 pg (ref 26.0–34.0)
MCHC: 34.2 g/dL (ref 30.0–36.0)
MCV: 90.5 fL (ref 80.0–100.0)
Monocytes Absolute: 0.4 K/uL (ref 0.1–1.0)
Monocytes Relative: 14 %
Neutro Abs: 2 K/uL (ref 1.7–7.7)
Neutrophils Relative %: 67 %
Platelets: 93 K/uL — ABNORMAL LOW (ref 150–400)
RBC: 4.43 MIL/uL (ref 4.22–5.81)
RDW: 14.1 % (ref 11.5–15.5)
WBC: 3 K/uL — ABNORMAL LOW (ref 4.0–10.5)
nRBC: 0 % (ref 0.0–0.2)

## 2024-05-14 LAB — RETICULOCYTES
Immature Retic Fract: 7.1 % (ref 2.3–15.9)
RBC.: 4.42 MIL/uL (ref 4.22–5.81)
Retic Count, Absolute: 103.4 K/uL (ref 19.0–186.0)
Retic Ct Pct: 2.3 % (ref 0.4–3.1)

## 2024-05-14 LAB — LACTATE DEHYDROGENASE: LDH: 225 U/L (ref 105–235)

## 2024-05-14 LAB — IRON AND TIBC
Iron: 104 ug/dL (ref 45–182)
Saturation Ratios: 37 % (ref 17.9–39.5)
TIBC: 281 ug/dL (ref 250–450)
UIBC: 177 ug/dL

## 2024-05-14 LAB — FERRITIN: Ferritin: 208 ng/mL (ref 24–336)

## 2024-05-15 ENCOUNTER — Inpatient Hospital Stay

## 2024-05-15 LAB — DIRECT ANTIGLOBULIN TEST (NOT AT ARMC)
DAT, IgG: UNDETERMINED
DAT, complement: UNDETERMINED

## 2024-05-15 LAB — HAPTOGLOBIN: Haptoglobin: 23 mg/dL — ABNORMAL LOW (ref 32–363)

## 2024-05-18 NOTE — Progress Notes (Unsigned)
 SABRA

## 2024-05-21 ENCOUNTER — Inpatient Hospital Stay: Admitting: Oncology

## 2024-05-21 DIAGNOSIS — D509 Iron deficiency anemia, unspecified: Secondary | ICD-10-CM | POA: Diagnosis not present

## 2024-05-21 DIAGNOSIS — D61818 Other pancytopenia: Secondary | ICD-10-CM

## 2024-05-21 NOTE — Progress Notes (Signed)
 [facility] [address], [place] [address]   Clinic Day:  05/21/2024  Referring physician: Shona Norleen PEDLAR, MD  Patient Care Team: Stephen Norleen PEDLAR, MD as PCP - General (Internal Medicine) Stephen Cough, MD as Consulting Physician (Orthopedic Surgery) Stephen Debby CROME, MD as Consulting Physician (Internal Medicine) Stephen Claudis PENNER, MD (Inactive) as Consulting Physician (Gastroenterology)   ASSESSMENT & PLAN:   Assessment: 1.  Pancytopenia: - History of leukopenia and thrombocytopenia over the last decade. - History of Nash cirrhosis and splenomegaly. - Worsening anemia in the last 1 year with hemoglobin between 8-9. - Started on iron tablet daily from 1 April. - Latest ferritin was 56 with saturation 14 on 12/09/2023. - CTAP on 11/03/2023: Splenomegaly measuring 18.7 cm.  New subtle subcapsular hypodensity in the posterior left liver measures 16 mm.  Cirrhotic liver morphology. - Denies BRBPR/melena.   2.  Provoked DVT: - He had a deep vein thrombosis of the lower extremity after knee surgery 12 years ago.  He was on limited duration anticoagulation.  No recurrence noted.  3. Social/Family History: -No tobacco use. No ETOH. No chemical exposure.  -No family history of leukemia. Sister had cancer, some type of GI.   Plan:  1.  Pancytopenia: - Most likely etiology of leukopenia and thrombocytopenia splenomegaly. - Monoferric  1 g on 12/17/2023. - Workup on 12/12/2023: B12 and folic acid  was normal.  Multiple myeloma workup is negative.  Direct Coombs test was positive for complement and IgG.  LDH is only minimally elevated at 197 and reticulocyte count is 3%.  Not strongly indicative of hemolytic anemia. -Labs from 05/14/2024 show hemoglobin 13.7, white blood cell count 3.0 and platelets 93,000.  Differential is unremarkable.  Iron studies show ferritin 208 with iron saturation 37%.  LDH normal at 225.  Reticulocyte count is 2.3%. -Recommend returning to  clinic in 6 months with labs a few days before. -Continue iron supplements every other day along with B12.  2.  Hypodensity in the left liver: - Last AFP in March was normal. - MRI liver on 12/16/2023: No findings suspicious for HCC.  Perigastric varices present.  3.  Cholecystectomy -Had successful removal of his gallbladder at [facility] last month. -Reports significant improvement of his energy levels, abdominal discomfort and bowel movement since surgery. -Reports he continues to have some ascites and is on several different diuretics.  He is followed closely by [facility] Dr. Michalene.     Orders Placed This Encounter  Procedures   Ferritin    Standing Status:   Future    Expected Date:   11/18/2024    Expiration Date:   02/16/2025   Iron and TIBC ([facility] ONLY)    Standing Status:   Future    Expected Date:   11/18/2024    Expiration Date:   02/16/2025   Lactate dehydrogenase    Standing Status:   Future    Expected Date:   11/18/2024    Expiration Date:   02/16/2025   CBC with Differential    Standing Status:   Future    Expected Date:   11/18/2024    Expiration Date:   05/21/2025    Stephen FORBES Hope, NP   11/20/202512:44 PM  CHIEF COMPLAINT/PURPOSE OF CONSULT:   Diagnosis: Pancytopenia and liver hypodensity  Current Therapy: Monoferric   HISTORY OF PRESENT ILLNESS:   Stephen Burch is a 68 y.o. male presenting to clinic today for follow-up for pancytopenia.  Patient has a medical history  of NASH liver cirrhosis, gastric varices, esophageal varices, rectal varices, Barrett's esophagus, DM type II, and hyperthyroidism.   INTERVAL HISTORY:   Stephen Burch is a 68 y.o. male presenting to the clinic today for follow-up of Pancytopenia and liver hypodensity.   Patient was hospitalized for acute cholecystitis on 01/26/2024 through 01/31/2024 with follow-up with GI.  Patient had gallbladder removed on 03/28/2019 with Stephen Burch at [facility].  He is followed by Dr.  Umberto at [facility] for [facility] cirrhosis with decompensation from large-volume ascites.  He is on furosemide  80 mg once daily and spironolactone  20 mg daily.  Today, he states that he is doing well overall. His appetite level is at 100%. His energy level is at 75%. Stephen Burch denies any improvement in energy after his iron infusion on 12/17/2023. He denies any blood per rectum, hematuria, or melena. He is not taking any new medications and does not have any surgeries planned.   Reports he still has some abdominal bloating but overall this is well.  Reports he is easily bruising with any bump or scrape.  Stephen Burch reports slow coagulation when bleeding.  PAST MEDICAL HISTORY:   Past Medical History: Past Medical History:  Diagnosis Date   Acute systolic (congestive) heart failure (HCC) 04/30/2018   Arthritis    knees, wrists   Barrett's esophagus    Cardiac arrest (HCC) 02/22/2016   Complication of anesthesia    02-22-2016 intraop cardiac arrest immediately after vancomyocin administration , pt cardiac arrest w/ Vfib,  please refer to anesthesia record in epic   Dysrhythmia    Afib 12/2018 - converted to NSR   Esophageal varices determined by endoscopy (HCC) 11/2016   grade 1   GERD (gastroesophageal reflux disease)    Hiatal hernia    History of adenomatous polyp of colon    History of asthma    as child   History of DVT (deep vein thrombosis)    right lower extremitty post-op right total knee surgery 09/ 2010,  treated w/ coumadin   History of esophageal dilatation 07/1998   for schatzski ring   History of kidney stones    History of staph infection 04/2016   MSSA infection of right total knee w/ sepsis   Hx of cardiac arrest    02-22-2016  intraoperative, immediately following moving pt into prone position and vancomyocin administration, cardiac arrest w/ Vfib (referred to anesthesia record in epic) pt extubated himself next day   Infection of prosthetic right knee joint     Liver cirrhosis secondary to NASH (nonalcoholic steatohepatitis) (HCC)    NAFLD-- followed by dr Stephen---  liver bx 09-30-2012  mild portal and focal sinusoidal fibrosis   Pre-diabetes    Pyogenic arthritis of right knee joint (HCC) 04/02/2016   Scapholunate advanced collapse of left wrist    deformity   Shock circulatory (HCC)    Staphylococcus aureus bacteremia with sepsis (HCC)    Thrombocytopenia    10-09-2017 per pt his platelets have always been low , followed by pcp, never been referred to hematologist    Surgical History: Past Surgical History:  Procedure Laterality Date   APPLICATION OF WOUND VAC Right 02/22/2016   Procedure: APPLICATION OF WOUND VAC;  Surgeon: Marcey Raman, MD;  Location: [facility] OR;  Service: Orthopedics;  Laterality: Right;   BIOPSY  02/10/2020   Procedure: BIOPSY;  Surgeon: Stephen Claudis PENNER, MD;  [facility]: [facility];  Service: Endoscopy;;   CARDIAC CATHETERIZATION N/A 02/24/2016   Procedure: Left  Heart Cath and Coronary Angiography;  Surgeon: Lonni JONETTA Cash, MD;  Location: [facility] INVASIVE CV LAB;  Service: Cardiovascular;  Laterality: N/A;  No angiographic evidence of CAD,  normal LVSF, ef 50-55%   CARPECTOMY Left 10/17/2017   Procedure: LEFT WRIST PROXIMAL ROW CARPECTOMY WITH POSTEROR IMBROSSEOUS NERVE EXCISION;  Surgeon: Stephen Cough, MD;  Location: [facility];  Service: Orthopedics;  Laterality: Left;  AXILLARY BLOCK   CARPECTOMY WITH RADIAL STYLOIDECTOMY Left 02/11/2019   Procedure: LEFT WRIST RADIAL STYLOIDECTOMY;  Surgeon: Stephen Cough, MD;  Location: [facility] OR;  Service: Orthopedics;  Laterality: Left;   CHOLECYSTECTOMY, LAPAROSCOPIC     COLONOSCOPY     COLONOSCOPY N/A 10/22/2023   Procedure: COLONOSCOPY;  Surgeon: Eartha Angelia Sieving, MD;  [facility]: [facility];  Service: Gastroenterology;  Laterality: N/A;  9:45AM;ASA 3   COLONOSCOPY WITH PROPOFOL  N/A 02/10/2020   Procedure: COLONOSCOPY WITH PROPOFOL ;  Surgeon:  Stephen Claudis PENNER, MD;  [facility];  Service: Endoscopy;  Laterality: N/A;  955   EAR CYST EXCISION Right 09/06/2014   Procedure: OPEN EXCISION BAKER'S CYST RIGHT KNEE;  Surgeon: Marcey Raman, MD;  Location: [facility] OR;  Service: Orthopedics;  Laterality: Right;   ESOPHAGEAL BANDING  06/30/2020   Procedure: ESOPHAGEAL BANDING;  Surgeon: Stephen Claudis PENNER, MD;  [facility];  Service: Endoscopy;;   ESOPHAGEAL DILATION  1996/ 2000   ESOPHAGOGASTRODUODENOSCOPY N/A 09/17/2012   Procedure: ESOPHAGOGASTRODUODENOSCOPY (EGD);  Surgeon: Claudis Burch Golda, MD;  [facility];  Service: Endoscopy;  Laterality: N/A;  200   ESOPHAGOGASTRODUODENOSCOPY N/A 12/15/2015   Procedure: ESOPHAGOGASTRODUODENOSCOPY (EGD);  Surgeon: Claudis Burch Golda, MD;  [facility];  Service: Endoscopy;  Laterality: N/A;  1245   ESOPHAGOGASTRODUODENOSCOPY N/A 12/17/2016   Procedure: ESOPHAGOGASTRODUODENOSCOPY (EGD);  Surgeon: Stephen Claudis PENNER, MD;  [facility];  Service: Endoscopy;  Laterality: N/A;  210   ESOPHAGOGASTRODUODENOSCOPY N/A 06/30/2020   Procedure: ESOPHAGOGASTRODUODENOSCOPY (EGD);  Surgeon: Stephen Claudis PENNER, MD;  [facility];  Service: Endoscopy;  Laterality: N/A;  11:15   ESOPHAGOGASTRODUODENOSCOPY (EGD) WITH PROPOFOL  N/A 02/10/2020   Procedure: ESOPHAGOGASTRODUODENOSCOPY (EGD) WITH PROPOFOL ;  Surgeon: Stephen Claudis PENNER, MD;  [facility];  Service: Endoscopy;  Laterality: N/A;   ESOPHAGOGASTRODUODENOSCOPY (EGD) WITH PROPOFOL  N/A 11/04/2020   Procedure: ESOPHAGOGASTRODUODENOSCOPY (EGD) WITH PROPOFOL ;  Surgeon: Eartha Angelia Sieving, MD;  [facility];  Service: Gastroenterology;  Laterality: N/A;  9:45   I & D KNEE WITH POLY EXCHANGE Right 04/02/2016   Procedure: IRRIGATION AND DEBRIDEMENT RIGHT KNEE WITH POLY EXCHANGE;  Surgeon: Marcey Raman, MD;  Location: [facility];  Service: Orthopedics;  Laterality: Right;   INCISION AND DRAINAGE HEMATOMA  POST LEFT  TOTAL KNEE  2012   INGUINAL HERNIA REPAIR Bilateral 05/22/2013   Procedure: REPAIR OF RECURRENT INCARCERATED INGUINAL HERNIA WITH MESH RIGHT SIDE,  REPAIR OF RECURRENT INGUINAL HERNIA WITH MESH LEFT SIDE;  Surgeon: Elon CHRISTELLA Pacini, MD;  Location: [facility] OR;  Service: General;  Laterality: Bilateral;   INGUINAL HERNIA REPAIR Bilateral 11/1997   INSERTION OF MESH Bilateral 05/22/2013   Procedure: INSERTION OF MESH;  Surgeon: Elon CHRISTELLA Pacini, MD;  Location: [facility] OR;  Service: General;  Laterality: Bilateral;   IRRIGATION AND DEBRIDEMENT KNEE Right 02/22/2016   Procedure: IRRIGATION AND DEBRIDEMENT KNEE;  Surgeon: Marcey Raman, MD;  Location: [facility];  Service: Orthopedics;  Laterality: Right;   Irrigation and Debridement right knee  03/23/2009   Dr. Cleotilde, [facility]   KNEE  ARTHROSCOPY W/ SYNOVECTOMY Right 01/30/2010   AND DEBRIDEMENT OF HETEROTOPIC   LIVER BIOPSY  09/30/2012   REVISION TOTAL KNEE ARTHROPLASTY Left fall 2012   TONSILLECTOMY  child   TOTAL KNEE ARTHROPLASTY Bilateral right 09/ 2010/  left 09-18-2010   TRANSTHORACIC ECHOCARDIOGRAM  02/22/2016   ef 30-35% (per cardiac cath 02-24-2016 normal), severe hypokinesis of the mid-apicalanteroseptal and apical myocardium,  grade 1 diastolic dysfunction/ trivial PR and TR   TRIGGER FINGER RELEASE Left 02/11/2019   Procedure: LEFT WRIST STENOSING TENOSYNOVITIS RELEASE;  Surgeon: Stephen Cough, MD;  Location: [facility];  Service: Orthopedics;  Laterality: Left;  MAC WITH AXILLARY BLOCK   WRIST ARTHROPLASTY Left 10/17/2017   Procedure: CAPITATE RESURFACING ARTHROPLASTY;  Surgeon: Stephen Cough, MD;  Location: [facility];  Service: Orthopedics;  Laterality: Left;    Social History: Social History   Socioeconomic History   Marital status: Legally Separated    Spouse name: Not on file   Number of children: 2   Years of education: Not on file   Highest education level: 12th grade  Occupational History    Employer: [employer]    Comment: part time  Tobacco Use   Smoking status: Former    Current packs/day: 0.00    Average packs/day: 0.3 packs/day for 3.0 years (0.8 ttl pk-yrs)    Types: Cigarettes    Start date: 10/10/1971    Quit date: 10/10/1974    Years since quitting: 49.6    Passive exposure: Past   Smokeless tobacco: Never  Vaping Use   Vaping status: Never Used  Substance and Sexual Activity   Alcohol use: Not Currently   Drug use: No   Sexual activity: Yes  Other Topics Concern   Not on file  Social History Narrative   ** Merged History Encounter **       Pt lives alone.   Social Drivers of Corporate Investment Banker Strain: Low Risk  (11/16/2019)   Overall Financial Resource Strain (CARDIA)    Difficulty of Paying Living Expenses: Not hard at all  Food Insecurity: No Food Insecurity (02/03/2024)   Hunger Vital Sign    Worried About Running Out of Food in the Last Year: Never true    Ran Out of Food in the Last Year: Never true  Transportation Needs: No Transportation Needs (02/03/2024)   PRAPARE - Administrator, Civil Service (Medical): No    Lack of Transportation (Non-Medical): No  Physical Activity: Inactive (11/16/2019)   Exercise Vital Sign    Days of Exercise per Week: 0 days    Minutes of Exercise per Session: 0 min  Stress: No Stress Concern Present (11/16/2019)   [employer] of Occupational Health - Occupational Stress Questionnaire    Feeling of Stress : Not at all  Social Connections: Moderately Isolated (01/27/2024)   Social Connection and Isolation Panel    Frequency of Communication with Friends and Family: More than three times a week    Frequency of Social Gatherings with Friends and Family: More than three times a week    Attends Religious Services: More than 4 times per year    Active Member of [employer] or Organizations: No    Attends Banker Meetings: Never    Marital Status: Separated  Intimate Partner Violence: Not At Risk  (02/03/2024)   Humiliation, Afraid, Rape, and Kick questionnaire    Fear of Current or Ex-Partner: No    Emotionally Abused: No  Physically Abused: No    Sexually Abused: No    Family History: Family History  Problem Relation Age of Onset   Arthritis Father        died age 39   GI Bleed Father        upper GI Bleed, non-ETOH cirrhosis   Arthritis Brother    Diabetes Brother        type 2   Diabetes Paternal Uncle        type 2   Heart attack Maternal Grandmother    Heart attack Maternal Grandfather    Colon cancer Neg Hx    Colon polyps Neg Hx     Current Medications:  Current Outpatient Medications:    EPINEPHrine  (PRIMATENE  MIST) 0.125 MG/ACT AERO, Inhale 1 puff into the lungs daily as needed (asthma)., Disp: , Rfl:    ferrous sulfate  (FEROSUL) 325 (65 FE) MG tablet, Take 1 tablet (325 mg total) by mouth daily with breakfast., Disp: 60 tablet, Rfl: 5   furosemide  (LASIX ) 40 MG tablet, Take 2 tablets by mouth once daily, Disp: 180 tablet, Rfl: 0   glipiZIDE  (GLUCOTROL  XL) 10 MG 24 hr tablet, Take 10 mg by mouth daily with breakfast., Disp: , Rfl:    pantoprazole  (PROTONIX ) 40 MG tablet, Take 1 tablet (40 mg total) by mouth 2 (two) times daily., Disp: 60 tablet, Rfl: 1   sildenafil (VIAGRA) 100 MG tablet, Take 100 mg by mouth daily as needed for erectile dysfunction., Disp: , Rfl:    spironolactone  (ALDACTONE ) 100 MG tablet, Take 2.5 tablets (250 mg total) by mouth daily., Disp: 75 tablet, Rfl: 2   Allergies: Allergies  Allergen Reactions   Asa [Aspirin] Shortness Of Breath    asthma symptoms   Penicillins Shortness Of Breath    Has patient had a PCN reaction causing immediate rash, facial/tongue/throat swelling, SOB or lightheadedness with hypotension: Yes Has patient had a PCN reaction causing severe rash involving mucus membranes or skin necrosis: No Has patient had a PCN reaction that required hospitalization No Has patient had a PCN reaction occurring within the  last 10 years: No If all of the above answers are NO, then may proceed with Cephalosporin use. asthma symptoms Patient has tolerated cefazolin , ceftriaxo   Vancomycin  Anaphylaxis    Immediately following being turned into prone position and Vancomycin  administration in the OR patient cardiac arrest w/  Vfib.    REVIEW OF SYSTEMS:   Review of Systems  Constitutional:  Positive for fatigue.  Hematological:  Bruises/bleeds easily.     VITALS:   Blood pressure 137/64, pulse 72, temperature 97.7 F (36.5 C), temperature source Tympanic, resp. rate 18, height 5' 11 (1.803 m), weight 189 lb (85.7 kg), SpO2 99%.  Wt Readings from Last 3 Encounters:  05/21/24 189 lb (85.7 kg)  04/04/24 182 lb (82.6 kg)  02/25/24 184 lb 9.6 oz (83.7 kg)    Body mass index is 26.36 kg/m.   PHYSICAL EXAM:   Physical Exam Constitutional:      Appearance: Normal appearance.  Cardiovascular:     Rate and Rhythm: Normal rate and regular rhythm.  Pulmonary:     Effort: Pulmonary effort is normal.     Breath sounds: Normal breath sounds.  Abdominal:     General: Bowel sounds are normal. There is distension.     Palpations: Abdomen is soft. There is no hepatomegaly or splenomegaly.  Musculoskeletal:        General: No swelling. Normal range of motion.  Neurological:     Mental Status: He is alert and oriented to person, place, and time. Mental status is at baseline.     LABS:   CBC    Component Value Date/Time   WBC 3.0 (L) 05/14/2024 1058   RBC 4.43 05/14/2024 1058   RBC 4.42 05/14/2024 1058   HGB 13.7 05/14/2024 1058   HGB 14.7 04/27/2019 1147   HCT 40.1 05/14/2024 1058   HCT 42.8 04/27/2019 1147   PLT 93 (L) 05/14/2024 1058   PLT 78 (LL) 04/27/2019 1147   MCV 90.5 05/14/2024 1058   MCV 89 04/27/2019 1147   MCH 30.9 05/14/2024 1058   MCHC 34.2 05/14/2024 1058   RDW 14.1 05/14/2024 1058   RDW 14.2 04/27/2019 1147   LYMPHSABS 0.3 (L) 05/14/2024 1058   MONOABS 0.4 05/14/2024 1058    EOSABS 0.2 05/14/2024 1058   BASOSABS 0.0 05/14/2024 1058    CMP    Component Value Date/Time   NA 132 (L) 04/04/2024 1912   NA 141 04/27/2019 1147   K 4.4 04/04/2024 1912   CL 97 (L) 04/04/2024 1912   CO2 25 04/04/2024 1912   GLUCOSE 116 (H) 04/04/2024 1912   BUN 13 04/04/2024 1912   BUN 10 04/27/2019 1147   CREATININE 1.08 04/04/2024 1912   CREATININE 1.04 09/23/2023 1553   CALCIUM 8.4 (L) 04/04/2024 1912   PROT 6.1 (L) 04/04/2024 1912   PROT 6.5 04/27/2019 1147   ALBUMIN 3.7 04/04/2024 1912   ALBUMIN 4.3 04/27/2019 1147   AST 32 04/04/2024 1912   ALT 29 04/04/2024 1912   ALKPHOS 215 (H) 04/04/2024 1912   BILITOT 1.4 (H) 04/04/2024 1912   BILITOT 1.6 (H) 04/27/2019 1147   GFRNONAA >60 04/04/2024 1912   GFRAA >60 02/08/2020 1525    No results found for: CEA1, CEA / No results found for: CEA1, CEA Lab Results  Component Value Date   PSA1 3.6 04/27/2019   No results found for: RJW800 No results found for: RJW874  Lab Results  Component Value Date   TOTALPROTELP 6.2 12/12/2023   ALBUMINELP 3.5 12/12/2023   A1GS 0.2 12/12/2023   A2GS 0.4 12/12/2023   BETS 0.9 12/12/2023   GAMS 1.1 12/12/2023   MSPIKE Not Observed 12/12/2023   SPEI Comment 12/12/2023   Lab Results  Component Value Date   TIBC 281 05/14/2024   TIBC 245 (L) 01/23/2024   TIBC 389 12/12/2023   FERRITIN 208 05/14/2024   FERRITIN 105 01/23/2024   FERRITIN 40 12/12/2023   IRONPCTSAT 37 05/14/2024   IRONPCTSAT 21 01/23/2024   IRONPCTSAT 15 (L) 12/12/2023   Lab Results  Component Value Date   LDH 225 05/14/2024   LDH 197 (H) 12/12/2023   LDH 140 07/30/2019     STUDIES:   No results found.

## 2024-05-25 ENCOUNTER — Encounter (INDEPENDENT_AMBULATORY_CARE_PROVIDER_SITE_OTHER): Payer: Self-pay | Admitting: Gastroenterology

## 2024-05-25 DIAGNOSIS — D692 Other nonthrombocytopenic purpura: Secondary | ICD-10-CM | POA: Diagnosis not present

## 2024-05-25 DIAGNOSIS — L80 Vitiligo: Secondary | ICD-10-CM | POA: Diagnosis not present

## 2024-05-25 DIAGNOSIS — L309 Dermatitis, unspecified: Secondary | ICD-10-CM | POA: Diagnosis not present

## 2024-05-25 DIAGNOSIS — D1801 Hemangioma of skin and subcutaneous tissue: Secondary | ICD-10-CM | POA: Diagnosis not present

## 2024-06-01 ENCOUNTER — Ambulatory Visit: Admitting: Urology

## 2024-06-01 VITALS — BP 135/63 | HR 86

## 2024-06-01 DIAGNOSIS — N529 Male erectile dysfunction, unspecified: Secondary | ICD-10-CM | POA: Diagnosis not present

## 2024-06-01 DIAGNOSIS — R972 Elevated prostate specific antigen [PSA]: Secondary | ICD-10-CM

## 2024-06-01 DIAGNOSIS — N5203 Combined arterial insufficiency and corporo-venous occlusive erectile dysfunction: Secondary | ICD-10-CM

## 2024-06-01 LAB — URINALYSIS, ROUTINE W REFLEX MICROSCOPIC
Bilirubin, UA: NEGATIVE
Glucose, UA: NEGATIVE
Ketones, UA: NEGATIVE
Leukocytes,UA: NEGATIVE
Nitrite, UA: NEGATIVE
Protein,UA: NEGATIVE
RBC, UA: NEGATIVE
Specific Gravity, UA: 1.01 (ref 1.005–1.030)
Urobilinogen, Ur: 1 mg/dL (ref 0.2–1.0)
pH, UA: 7 (ref 5.0–7.5)

## 2024-06-01 MED ORDER — TADALAFIL 20 MG PO TABS: 20.0000 mg | ORAL_TABLET | Freq: Every day | ORAL | 5 refills | Status: AC | PRN

## 2024-06-01 NOTE — Progress Notes (Unsigned)
 06/01/2024 10:25 AM   Stephen Burch 1955-11-06 [id number]  Referring provider: Hyacinth Honey, NP [address] [place],  [place] [address]  No chief complaint on file.   HPI:  New patient-  1) PSA elevation - PSA was 5.4, 3.4, 5.8, 3.8, 4.1, 5.1 over an unknown interval. No bx. No FH PCa. Oct 2025 Ct a/p - benign GU. Prostate 4.7 x 3.5 x 3.6 = 30 g. No LAD or bone lesions. Cirrhosis, left varicocele.   2) BPH, urgency - Prostate 30 g on CT. Saw GU - had cystoscopy and PVR. Told he wasn't emptying and tried medicine. No change. Tried gemtesa.   3) ED - Takes sildenafil 100 mg. Doesn't help too much.    No OAC. Thrombocytopenia, cirrhosis. Oct 2025 cr 1.08, INR 1.2. Nov 2025 plt 93. 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   PMH: Past Medical History:  Diagnosis Date   Acute systolic (congestive) heart failure (HCC) 04/30/2018   Arthritis    knees, wrists   Barrett's esophagus    Cardiac arrest (HCC) 02/22/2016   Complication of anesthesia    02-22-2016 intraop cardiac arrest immediately after vancomyocin administration , pt cardiac arrest w/ Vfib,  please refer to anesthesia record in epic   Dysrhythmia    Afib 12/2018 - converted to NSR   Esophageal varices determined by endoscopy (HCC) 11/2016   grade 1   GERD (gastroesophageal reflux disease)    Hiatal hernia    History of adenomatous polyp of colon    History of asthma    as child   History of DVT (deep vein thrombosis)    right lower extremitty post-op right total knee surgery 09/ 2010,  treated w/ coumadin   History of esophageal dilatation 07/1998   for schatzski ring   History of kidney stones    History of staph infection 04/2016   MSSA infection of right total knee w/ sepsis   Hx of cardiac arrest    02-22-2016  intraoperative, immediately following moving pt into prone position and vancomyocin administration, cardiac arrest w/ Vfib (referred to anesthesia record in epic) pt extubated himself next day    Infection of prosthetic right knee joint    Liver cirrhosis secondary to NASH (nonalcoholic steatohepatitis) (HCC)    NAFLD-- followed by dr golda---  liver bx 09-30-2012  mild portal and focal sinusoidal fibrosis   Pre-diabetes    Pyogenic arthritis of right knee joint (HCC) 04/02/2016   Scapholunate advanced collapse of left wrist    deformity   Shock circulatory (HCC)    Staphylococcus aureus bacteremia with sepsis (HCC)    Thrombocytopenia    10-09-2017 per pt his platelets have always been low , followed by pcp, never been referred to hematologist    Surgical History: Past Surgical History:  Procedure Laterality Date   APPLICATION OF WOUND VAC Right 02/22/2016   Procedure: APPLICATION OF WOUND VAC;  Surgeon: Marcey Raman, MD;  Location: [facility];  Service: Orthopedics;  Laterality: Right;   BIOPSY  02/10/2020   Procedure: BIOPSY;  Surgeon: Golda Claudis PENNER, MD;  [facility];  Service: Endoscopy;;   CARDIAC CATHETERIZATION N/A 02/24/2016   Procedure: Left Heart Cath and Coronary Angiography;  Surgeon: Lonni JONETTA Cash, MD;  Location: [facility] INVASIVE CV LAB;  Service: Cardiovascular;  Laterality: N/A;  No angiographic evidence of CAD,  normal LVSF, ef 50-55%   CARPECTOMY Left 10/17/2017   Procedure: LEFT WRIST PROXIMAL ROW CARPECTOMY WITH POSTEROR  IMBROSSEOUS NERVE EXCISION;  Surgeon: Sissy Cough, MD;  Location: [facility];  Service: Orthopedics;  Laterality: Left;  AXILLARY BLOCK   CARPECTOMY WITH RADIAL STYLOIDECTOMY Left 02/11/2019   Procedure: LEFT WRIST RADIAL STYLOIDECTOMY;  Surgeon: Sissy Cough, MD;  Location[facility] OR;  Service: Orthopedics;  Laterality: Left;   CHOLECYSTECTOMY, LAPAROSCOPIC     COLONOSCOPY     COLONOSCOPY N/A 10/22/2023   Procedure: COLONOSCOPY;  Surgeon: Eartha Angelia Sieving, MD;  [facility];  Service: Gastroenterology;  Laterality: N/A;  9:45AM;ASA 3   COLONOSCOPY WITH PROPOFOL  N/A 02/10/2020    Procedure: COLONOSCOPY WITH PROPOFOL ;  Surgeon: Golda Claudis PENNER, MD;  [facility];  Service: Endoscopy;  Laterality: N/A;  955   EAR CYST EXCISION Right 09/06/2014   Procedure: OPEN EXCISION BAKER'S CYST RIGHT KNEE;  Surgeon: Marcey Raman, MD;  [facility]: [facility];  Service: Orthopedics;  Laterality: Right;   ESOPHAGEAL BANDING  06/30/2020   Procedure: ESOPHAGEAL BANDING;  Surgeon: Golda Claudis PENNER, MD;  [facility];  Service: Endoscopy;;   ESOPHAGEAL DILATION  1996/ 2000   ESOPHAGOGASTRODUODENOSCOPY N/A 09/17/2012   Procedure: ESOPHAGOGASTRODUODENOSCOPY (EGD);  Surgeon: Claudis PENNER Golda, MD;  [facility];  Service: Endoscopy;  Laterality: N/A;  200   ESOPHAGOGASTRODUODENOSCOPY N/A 12/15/2015   Procedure: ESOPHAGOGASTRODUODENOSCOPY (EGD);  Surgeon: Claudis PENNER Golda, MD;  [facility];  Service: Endoscopy;  Laterality: N/A;  1245   ESOPHAGOGASTRODUODENOSCOPY N/A 12/17/2016   Procedure: ESOPHAGOGASTRODUODENOSCOPY (EGD);  Surgeon: Golda Claudis PENNER, MD;  [facility];  Service: Endoscopy;  Laterality: N/A;  210   ESOPHAGOGASTRODUODENOSCOPY N/A 06/30/2020   Procedure: ESOPHAGOGASTRODUODENOSCOPY (EGD);  Surgeon: Golda Claudis PENNER, MD;  [facility];  Service: Endoscopy;  Laterality: N/A;  11:15   ESOPHAGOGASTRODUODENOSCOPY (EGD) WITH PROPOFOL  N/A 02/10/2020   Procedure: ESOPHAGOGASTRODUODENOSCOPY (EGD) WITH PROPOFOL ;  Surgeon: Golda Claudis PENNER, MD;  [facility];  Service: Endoscopy;  Laterality: N/A;   ESOPHAGOGASTRODUODENOSCOPY (EGD) WITH PROPOFOL  N/A 11/04/2020   Procedure: ESOPHAGOGASTRODUODENOSCOPY (EGD) WITH PROPOFOL ;  Surgeon: Eartha Angelia Sieving, MD;  [facility];  Service: Gastroenterology;  Laterality: N/A;  9:45   I & D KNEE WITH POLY EXCHANGE Right 04/02/2016   Procedure: IRRIGATION AND DEBRIDEMENT RIGHT KNEE WITH POLY EXCHANGE;  Surgeon: Marcey Raman, MD;  Location: [facility];  Service: Orthopedics;   Laterality: Right;   INCISION AND DRAINAGE HEMATOMA POST LEFT  TOTAL KNEE  2012   INGUINAL HERNIA REPAIR Bilateral 05/22/2013   Procedure: REPAIR OF RECURRENT INCARCERATED INGUINAL HERNIA WITH MESH RIGHT SIDE,  REPAIR OF RECURRENT INGUINAL HERNIA WITH MESH LEFT SIDE;  Surgeon: Elon CHRISTELLA Pacini, MD;  Location: [facility];  Service: General;  Laterality: Bilateral;   INGUINAL HERNIA REPAIR Bilateral 11/1997   INSERTION OF MESH Bilateral 05/22/2013   Procedure: INSERTION OF MESH;  Surgeon: Elon CHRISTELLA Pacini, MD;  Location: [facility];  Service: General;  Laterality: Bilateral;   IRRIGATION AND DEBRIDEMENT KNEE Right 02/22/2016   Procedure: IRRIGATION AND DEBRIDEMENT KNEE;  Surgeon: Marcey Raman, MD;  Location: [facility];  Service: Orthopedics;  Laterality: Right;   Irrigation and Debridement right knee  03/23/2009   Dr. Cleotilde, [facility]   KNEE ARTHROSCOPY W/ SYNOVECTOMY Right 01/30/2010   AND DEBRIDEMENT OF HETEROTOPIC   LIVER BIOPSY  09/30/2012   REVISION TOTAL KNEE ARTHROPLASTY Left fall 2012   TONSILLECTOMY  child   TOTAL KNEE ARTHROPLASTY Bilateral right 09/ 2010/  left 09-18-2010   TRANSTHORACIC ECHOCARDIOGRAM  02/22/2016  ef 30-35% (per cardiac cath 02-24-2016 normal), severe hypokinesis of the mid-apicalanteroseptal and apical myocardium,  grade 1 diastolic dysfunction/ trivial PR and TR   TRIGGER FINGER RELEASE Left 02/11/2019   Procedure: LEFT WRIST STENOSING TENOSYNOVITIS RELEASE;  Surgeon: Sissy Cough, MD;  Location: [facility];  Service: Orthopedics;  Laterality: Left;  MAC WITH AXILLARY BLOCK   WRIST ARTHROPLASTY Left 10/17/2017   Procedure: CAPITATE RESURFACING ARTHROPLASTY;  Surgeon: Sissy Cough, MD;  Location: [facility];  Service: Orthopedics;  Laterality: Left;    Home Medications:  Allergies as of 06/01/2024       Reactions   Asa [aspirin] Shortness Of Breath   asthma symptoms   Penicillins Shortness Of Breath   Has patient had a PCN reaction causing immediate  rash, facial/tongue/throat swelling, SOB or lightheadedness with hypotension: Yes Has patient had a PCN reaction causing severe rash involving mucus membranes or skin necrosis: No Has patient had a PCN reaction that required hospitalization No Has patient had a PCN reaction occurring within the last 10 years: No If all of the above answers are NO, then may proceed with Cephalosporin use. asthma symptoms Patient has tolerated cefazolin , ceftriaxo   Vancomycin  Anaphylaxis   Immediately following being turned into prone position and Vancomycin  administration in the OR patient cardiac arrest w/  Vfib.        Medication List        Accurate as of June 01, 2024 10:25 AM. If you have any questions, ask your nurse or doctor.          ferrous sulfate  325 (65 FE) MG tablet Commonly known as: FeroSul Take 1 tablet (325 mg total) by mouth daily with breakfast.   furosemide  40 MG tablet Commonly known as: LASIX  Take 2 tablets by mouth once daily   glipiZIDE  10 MG 24 hr tablet Commonly known as: GLUCOTROL  XL Take 10 mg by mouth daily with breakfast.   pantoprazole  40 MG tablet Commonly known as: PROTONIX  Take 1 tablet (40 mg total) by mouth 2 (two) times daily.   Primatene  Mist 0.125 MG/ACT Aero Generic drug: EPINEPHrine  Inhale 1 puff into the lungs daily as needed (asthma).   sildenafil 100 MG tablet Commonly known as: VIAGRA Take 100 mg by mouth daily as needed for erectile dysfunction.   spironolactone  100 MG tablet Commonly known as: ALDACTONE  Take 2.5 tablets (250 mg total) by mouth daily.        Allergies:  Allergies  Allergen Reactions   Asa [Aspirin] Shortness Of Breath    asthma symptoms   Penicillins Shortness Of Breath    Has patient had a PCN reaction causing immediate rash, facial/tongue/throat swelling, SOB or lightheadedness with hypotension: Yes Has patient had a PCN reaction causing severe rash involving mucus membranes or skin necrosis:  No Has patient had a PCN reaction that required hospitalization No Has patient had a PCN reaction occurring within the last 10 years: No If all of the above answers are NO, then may proceed with Cephalosporin use. asthma symptoms Patient has tolerated cefazolin , ceftriaxo   Vancomycin  Anaphylaxis    Immediately following being turned into prone position and Vancomycin  administration in the OR patient cardiac arrest w/  Vfib.    Family History: Family History  Problem Relation Age of Onset   Arthritis Father        died age 32   GI Bleed Father        upper GI Bleed, non-ETOH cirrhosis   Arthritis Brother  Diabetes Brother        type 2   Diabetes Paternal Uncle        type 2   Heart attack Maternal Grandmother    Heart attack Maternal Grandfather    Colon cancer Neg Hx    Colon polyps Neg Hx     Social History:  reports that he quit smoking about 49 years ago. His smoking use included cigarettes. He started smoking about 52 years ago. He has a 0.8 pack-year smoking history. He has been exposed to tobacco smoke. He has never used smokeless tobacco. He reports that he does not currently use alcohol. He reports that he does not use drugs.   Physical Exam: BP 135/63   Pulse 86   Constitutional:  Alert and oriented, No acute distress. HEENT: [place] AT, moist mucus membranes.  Trachea midline, no masses. Cardiovascular: No clubbing, cyanosis, or edema. Respiratory: Normal respiratory effort, no increased work of breathing. GI: Abdomen is soft, nontender, nondistended, no abdominal masses GU: No CVA tenderness Skin: No rashes, bruises or suspicious lesions. Neurologic: Grossly intact, no focal deficits, moving all 4 extremities. Psychiatric: Normal mood and affect.  Laboratory Data: Lab Results  Component Value Date   WBC 3.0 (L) 05/14/2024   HGB 13.7 05/14/2024   HCT 40.1 05/14/2024   MCV 90.5 05/14/2024   PLT 93 (L) 05/14/2024    Lab Results  Component Value Date    CREATININE 1.08 04/04/2024    No results found for: PSA  Lab Results  Component Value Date   TESTOSTERONE  447 01/24/2017    Lab Results  Component Value Date   HGBA1C 6.3 (A) 05/16/2020    Urinalysis    Component Value Date/Time   COLORURINE YELLOW 04/04/2024 1903   APPEARANCEUR CLEAR 04/04/2024 1903   LABSPEC 1.004 (L) 04/04/2024 1903   PHURINE 7.0 04/04/2024 1903   GLUCOSEU NEGATIVE 04/04/2024 1903   HGBUR NEGATIVE 04/04/2024 1903   BILIRUBINUR NEGATIVE 04/04/2024 1903   BILIRUBINUR negative 04/27/2019 1143   KETONESUR NEGATIVE 04/04/2024 1903   PROTEINUR NEGATIVE 04/04/2024 1903   UROBILINOGEN 0.2 04/27/2019 1143   UROBILINOGEN 0.2 05/13/2013 0930   NITRITE NEGATIVE 04/04/2024 1903   LEUKOCYTESUR NEGATIVE 04/04/2024 1903    No results found for: LABMICR, WBCUA, RBCUA, LABEPIT, MUCUS, BACTERIA  Pertinent Imaging: N/a    Assessment & Plan:    1. Elevated PSA (Primary)  I had a long discussion with the patient on the nature of elevated PSA - benign vs malignant causes. We discussed age specific levels and that PCa can be seen on a biopsy with very low PSA levels (<=2.5). We discussed the nature risks and benefits of continued surveillance, other lab tests, imaging as well as prostate biopsy. We discussed the management of prostate cancer might include active surveillance or treatment depending on biopsy findings. All questions answered.  2. ED - discussed trial of tadalafil PRN.   - Urinalysis, Routine w reflex microscopic; Future - Urinalysis, Routine w reflex microscopic   No follow-ups on file.  Donnice Brooks, MD  [facility]  [address] [place], [place] [address] [phone]

## 2024-06-02 ENCOUNTER — Ambulatory Visit: Payer: Self-pay

## 2024-06-02 LAB — PSA: Prostate Specific Ag, Serum: 2.7 ng/mL (ref 0.0–4.0)

## 2024-06-04 ENCOUNTER — Ambulatory Visit (INDEPENDENT_AMBULATORY_CARE_PROVIDER_SITE_OTHER): Admitting: Gastroenterology

## 2024-06-10 DIAGNOSIS — K7460 Unspecified cirrhosis of liver: Secondary | ICD-10-CM | POA: Diagnosis not present

## 2024-06-10 DIAGNOSIS — E0590 Thyrotoxicosis, unspecified without thyrotoxic crisis or storm: Secondary | ICD-10-CM | POA: Diagnosis not present

## 2024-06-10 DIAGNOSIS — D649 Anemia, unspecified: Secondary | ICD-10-CM | POA: Diagnosis not present

## 2024-06-23 ENCOUNTER — Ambulatory Visit (INDEPENDENT_AMBULATORY_CARE_PROVIDER_SITE_OTHER): Admitting: Gastroenterology

## 2024-06-23 ENCOUNTER — Encounter (INDEPENDENT_AMBULATORY_CARE_PROVIDER_SITE_OTHER): Payer: Self-pay | Admitting: Gastroenterology

## 2024-06-23 VITALS — BP 102/51 | HR 79 | Temp 98.2°F | Ht 71.0 in | Wt 189.3 lb

## 2024-06-23 DIAGNOSIS — K2270 Barrett's esophagus without dysplasia: Secondary | ICD-10-CM

## 2024-06-23 DIAGNOSIS — K5909 Other constipation: Secondary | ICD-10-CM | POA: Insufficient documentation

## 2024-06-23 DIAGNOSIS — I8500 Esophageal varices without bleeding: Secondary | ICD-10-CM | POA: Diagnosis not present

## 2024-06-23 DIAGNOSIS — K7469 Other cirrhosis of liver: Secondary | ICD-10-CM

## 2024-06-23 DIAGNOSIS — R188 Other ascites: Secondary | ICD-10-CM

## 2024-06-23 DIAGNOSIS — I8510 Secondary esophageal varices without bleeding: Secondary | ICD-10-CM

## 2024-06-23 DIAGNOSIS — I864 Gastric varices: Secondary | ICD-10-CM

## 2024-06-23 MED ORDER — LACTULOSE 10 GM/15ML PO SOLN: 20.0000 g | Freq: Two times a day (BID) | ORAL | 0 refills | Status: AC

## 2024-06-23 NOTE — Patient Instructions (Signed)
 It was very nice to meet you today, as dicussed with will plan for the following :  1) Stop iron   2) Start lactulose   3) Check you BP at home and continue Carvediolol   4) Ultrasound and labs in March with [facility]

## 2024-06-23 NOTE — Progress Notes (Signed)
 Dwight Adamczak Faizan Dravyn Severs , M.D. Gastroenterology & Hepatology [facility]astroenter[facility] [address] [place], [place] [address] Primary Care Physician: Shona Norleen PEDLAR, MD [address] [name] [address]  Chief Complaint: Decompensated cirrhosis  History of Present Illness: Stephen Burch is a 68 y.o. male Rmale with past medical history of NASH cirrhosis complicated by grade 3 esophgeal varices, Type II GOV varices, GERD complicated by Barrett's esophagus without dysplasia, here for follow-up for decompensated cirrhosis  Patient was recently seen by Dr. Umberto at [facility] transplant hepatology.  He was restarted on carvedilol  for varices.  Previously seen by [facility] 01/2024  Patient had cholecystectomy in October 2025 at [facility] and since then he reports he has been feeling well.  Patient currently taking 80 mg of furosemide  and 200 of spironolactone .  Denies any weight gain or abdominal distention more than he previously had.  Denies any fluid leakage from surgical site.  Patient restarted carvedilol  6.25 but has been taking only once daily.  Recently had blood work at [facility].  Patient has a bowel movement every day but would skip 1 or 2 days  Last EGD: 11/04/20 grade III esophageal varices, Type 2 gastroesophageal varices (GOV 2 esophageal varices which extend along the fundus) w/o bleeding, single gastric polyp    Last Colonoscopy:10/2023 - The examined portion of the ileum was normal.                           - 9 non-bleeding colonic angiodysplastic lesions.                            Treated with argon plasma coagulation (APC).                           - Two 1 to 2 mm polyps in the cecum, removed with a                            cold biopsy forceps. Resected and retrieved.                           - Four 3 to 6 mm polyps in the transverse colon and                            in the cecum, removed with a cold snare. Resected                            and  retrieved.                           - Two 3 to 4 mm polyps in the sigmoid colon and in                            the descending colon, removed with a cold snare.                            Resected and retrieved.                           -  Rectal varices. CECUM AND TRANSVERSE COLON, POLYPECTOMY:  Tubular adenoma, multiple fragments  Negative for high-grade dysplasia and carcinoma   B. DESCENDING AND SIGMOID COLON, POLYPECTOMY:  Compatible hyperplastic polyps  Negative for dysplasia and carcinoma    Repeat TCS 3 years    Past Medical History: Past Medical History:  Diagnosis Date   Acute systolic (congestive) heart failure (HCC) 04/30/2018   Arthritis    knees, wrists   Barrett's esophagus    Cardiac arrest (HCC) 02/22/2016   Complication of anesthesia    02-22-2016 intraop cardiac arrest immediately after vancomyocin administration , pt cardiac arrest w/ Vfib,  please refer to anesthesia record in epic   Dysrhythmia    Afib 12/2018 - converted to NSR   Esophageal varices determined by endoscopy (HCC) 11/2016   grade 1   GERD (gastroesophageal reflux disease)    Hiatal hernia    History of adenomatous polyp of colon    History of asthma    as child   History of DVT (deep vein thrombosis)    right lower extremitty post-op right total knee surgery 09/ 2010,  treated w/ coumadin   History of esophageal dilatation 07/1998   for schatzski ring   History of kidney stones    History of staph infection 04/2016   MSSA infection of right total knee w/ sepsis   Hx of cardiac arrest    02-22-2016  intraoperative, immediately following moving pt into prone position and vancomyocin administration, cardiac arrest w/ Vfib (referred to anesthesia record in epic) pt extubated himself next day   Infection of prosthetic right knee joint    Liver cirrhosis secondary to NASH (nonalcoholic steatohepatitis) (HCC)    NAFLD-- followed by dr golda---  liver bx 09-30-2012  mild portal and focal  sinusoidal fibrosis   Pre-diabetes    Pyogenic arthritis of right knee joint (HCC) 04/02/2016   Scapholunate advanced collapse of left wrist    deformity   Shock circulatory (HCC)    Staphylococcus aureus bacteremia with sepsis (HCC)    Thrombocytopenia    10-09-2017 per pt his platelets have always been low , followed by pcp, never been referred to hematologist    Past Surgical History: Past Surgical History:  Procedure Laterality Date   APPLICATION OF WOUND VAC Right 02/22/2016   Procedure: APPLICATION OF WOUND VAC;  Surgeon: Marcey Raman, MD;  [facility]: [facility];  Service: Orthopedics;  Laterality: Right;   BIOPSY  02/10/2020   Procedure: BIOPSY;  Surgeon: Golda Claudis PENNER, MD;  [facility];  Service: Endoscopy;;   CARDIAC CATHETERIZATION N/A 02/24/2016   Procedure: Left Heart Cath and Coronary Angiography;  Surgeon: Lonni JONETTA Cash, MD;  Location: [facility] INVASIVE CV LAB;  Service: Cardiovascular;  Laterality: N/A;  No angiographic evidence of CAD,  normal LVSF, ef 50-55%   CARPECTOMY Left 10/17/2017   Procedure: LEFT WRIST PROXIMAL ROW CARPECTOMY WITH POSTEROR IMBROSSEOUS NERVE EXCISION;  Surgeon: Sissy Cough, MD;  Location: [facility] [place];  Service: Orthopedics;  Laterality: Left;  AXILLARY BLOCK   CARPECTOMY WITH RADIAL STYLOIDECTOMY Left 02/11/2019   Procedure: LEFT WRIST RADIAL STYLOIDECTOMY;  Surgeon: Sissy Cough, MD;  Location: [facility] OR;  Service: Orthopedics;  Laterality: Left;   CHOLECYSTECTOMY     CHOLECYSTECTOMY, LAPAROSCOPIC     COLONOSCOPY     COLONOSCOPY N/A 10/22/2023   Procedure: COLONOSCOPY;  Surgeon: Eartha Angelia Sieving, MD;  [facility];  Service: Gastroenterology;  Laterality: N/A;  9:45AM;ASA 3   COLONOSCOPY WITH PROPOFOL   N/A 02/10/2020   Procedure: COLONOSCOPY WITH PROPOFOL ;  Surgeon: Golda Claudis PENNER, MD;  [facility];  Service: Endoscopy;  Laterality: N/A;  955   EAR CYST EXCISION Right 09/06/2014    Procedure: OPEN EXCISION BAKER'S CYST RIGHT KNEE;  Surgeon: Marcey Raman, MD;  Location: [facility] OR;  Service: Orthopedics;  Laterality: Right;   ESOPHAGEAL BANDING  06/30/2020   Procedure: ESOPHAGEAL BANDING;  Surgeon: Golda Claudis PENNER, MD;  [facility];  Service: Endoscopy;;   ESOPHAGEAL DILATION  1996/ 2000   ESOPHAGOGASTRODUODENOSCOPY N/A 09/17/2012   Procedure: ESOPHAGOGASTRODUODENOSCOPY (EGD);  Surgeon: Claudis PENNER Golda, MD;  [facility];  Service: Endoscopy;  Laterality: N/A;  200   ESOPHAGOGASTRODUODENOSCOPY N/A 12/15/2015   Procedure: ESOPHAGOGASTRODUODENOSCOPY (EGD);  Surgeon: Claudis PENNER Golda, MD;  [facility];  Service: Endoscopy;  Laterality: N/A;  1245   ESOPHAGOGASTRODUODENOSCOPY N/A 12/17/2016   Procedure: ESOPHAGOGASTRODUODENOSCOPY (EGD);  Surgeon: Golda Claudis PENNER, MD;  [facility];  Service: Endoscopy;  Laterality: N/A;  210   ESOPHAGOGASTRODUODENOSCOPY N/A 06/30/2020   Procedure: ESOPHAGOGASTRODUODENOSCOPY (EGD);  Surgeon: Golda Claudis PENNER, MD;  [facility];  Service: Endoscopy;  Laterality: N/A;  11:15   ESOPHAGOGASTRODUODENOSCOPY (EGD) WITH PROPOFOL  N/A 02/10/2020   Procedure: ESOPHAGOGASTRODUODENOSCOPY (EGD) WITH PROPOFOL ;  Surgeon: Golda Claudis PENNER, MD;  [facility];  Service: Endoscopy;  Laterality: N/A;   ESOPHAGOGASTRODUODENOSCOPY (EGD) WITH PROPOFOL  N/A 11/04/2020   Procedure: ESOPHAGOGASTRODUODENOSCOPY (EGD) WITH PROPOFOL ;  Surgeon: Eartha Angelia Sieving, MD;  [facility];  Service: Gastroenterology;  Laterality: N/A;  9:45   I & D KNEE WITH POLY EXCHANGE Right 04/02/2016   Procedure: IRRIGATION AND DEBRIDEMENT RIGHT KNEE WITH POLY EXCHANGE;  Surgeon: Marcey Raman, MD;  [facility];  Service: Orthopedics;  Laterality: Right;   INCISION AND DRAINAGE HEMATOMA POST LEFT  TOTAL KNEE  2012   INGUINAL HERNIA REPAIR Bilateral 05/22/2013   Procedure: REPAIR OF RECURRENT INCARCERATED INGUINAL  HERNIA WITH MESH RIGHT SIDE,  REPAIR OF RECURRENT INGUINAL HERNIA WITH MESH LEFT SIDE;  Surgeon: Elon CHRISTELLA Pacini, MD;  Location: [facility];  Service: General;  Laterality: Bilateral;   INGUINAL HERNIA REPAIR Bilateral 11/1997   INSERTION OF MESH Bilateral 05/22/2013   Procedure: INSERTION OF MESH;  Surgeon: Elon CHRISTELLA Pacini, MD;  Location: [facility];  Service: General;  Laterality: Bilateral;   IRRIGATION AND DEBRIDEMENT KNEE Right 02/22/2016   Procedure: IRRIGATION AND DEBRIDEMENT KNEE;  Surgeon: Marcey Raman, MD;  Location: [facility];  Service: Orthopedics;  Laterality: Right;   Irrigation and Debridement right knee  03/23/2009   Dr. Cleotilde, [facility]   KNEE ARTHROSCOPY W/ SYNOVECTOMY Right 01/30/2010   AND DEBRIDEMENT OF HETEROTOPIC   LIVER BIOPSY  09/30/2012   REVISION TOTAL KNEE ARTHROPLASTY Left fall 2012   TONSILLECTOMY  child   TOTAL KNEE ARTHROPLASTY Bilateral right 09/ 2010/  left 09-18-2010   TRANSTHORACIC ECHOCARDIOGRAM  02/22/2016   ef 30-35% (per cardiac cath 02-24-2016 normal), severe hypokinesis of the mid-apicalanteroseptal and apical myocardium,  grade 1 diastolic dysfunction/ trivial PR and TR   TRIGGER FINGER RELEASE Left 02/11/2019   Procedure: LEFT WRIST STENOSING TENOSYNOVITIS RELEASE;  Surgeon: Sissy Cough, MD;  Location: [facility] OR;  Service: Orthopedics;  Laterality: Left;  MAC WITH AXILLARY BLOCK   WRIST ARTHROPLASTY Left 10/17/2017   Procedure: CAPITATE RESURFACING ARTHROPLASTY;  Surgeon: Sissy Cough, MD;  Location: [facility];  Service: Orthopedics;  Laterality: Left;    Family History: Family History  Problem Relation Age of Onset   Arthritis Father        died age 17   GI Bleed Father        upper GI Bleed, non-ETOH cirrhosis   Arthritis Brother    Diabetes Brother        type 2   Diabetes Paternal Uncle        type 2   Heart attack Maternal Grandmother    Heart attack Maternal Grandfather    Colon cancer Neg Hx    Colon polyps Neg Hx      Social History:Tobacco Use History[1] Social History   Substance and Sexual Activity  Alcohol Use Not Currently   Social History   Substance and Sexual Activity  Drug Use No    Allergies: Allergies[2]  Medications: Current Outpatient Medications  Medication Sig Dispense Refill   carvedilol  (COREG ) 6.25 MG tablet Take 6.25 mg by mouth 2 (two) times daily with a meal.     ferrous sulfate  (FEROSUL) 325 (65 FE) MG tablet Take 1 tablet (325 mg total) by mouth daily with breakfast. (Patient taking differently: Take 325 mg by mouth daily with breakfast. qod) 60 tablet 5   furosemide  (LASIX ) 40 MG tablet Take 2 tablets by mouth once daily (Patient taking differently: Take 80 mg by mouth daily. 2.5 tablets daily) 180 tablet 0   glipiZIDE  (GLUCOTROL  XL) 10 MG 24 hr tablet Take 10 mg by mouth daily with breakfast.     pantoprazole  (PROTONIX ) 40 MG tablet Take 1 tablet (40 mg total) by mouth 2 (two) times daily. 60 tablet 1   sildenafil (VIAGRA) 100 MG tablet Take 100 mg by mouth daily as needed for erectile dysfunction.     spironolactone  (ALDACTONE ) 100 MG tablet Take 2.5 tablets (250 mg total) by mouth daily. 75 tablet 2   tadalafil  (CIALIS ) 20 MG tablet Take 1 tablet (20 mg total) by mouth daily as needed for erectile dysfunction. 10 tablet 5   No current facility-administered medications for this visit.    Review of Systems: GENERAL: negative for malaise, night sweats HEENT: No changes in hearing or vision, no nose bleeds or other nasal problems. NECK: Negative for lumps, goiter, pain and significant neck swelling RESPIRATORY: Negative for cough, wheezing CARDIOVASCULAR: Negative for chest pain, leg swelling, palpitations, orthopnea GI: SEE HPI MUSCULOSKELETAL: Negative for joint pain or swelling, back pain, and muscle pain. SKIN: Negative for lesions, rash HEMATOLOGY Negative for prolonged bleeding, bruising easily, and swollen nodes. ENDOCRINE: Negative for cold or heat  intolerance, polyuria, polydipsia and goiter. NEURO: negative for tremor, gait imbalance, syncope and seizures. The remainder of the review of systems is noncontributory.   Physical Exam: BP (!) 102/51   Pulse 79   Temp 98.2 F (36.8 C)   Ht 5' 11 (1.803 m)   Wt 189 lb 4.8 oz (85.9 kg)   BMI 26.40 kg/m  GENERAL: The patient is AO x3, in no acute distress. HEENT: Head is normocephalic and atraumatic. EOMI are intact. Mouth is well hydrated and without lesions. NECK: Supple. No masses LUNGS: Clear to auscultation. No presence of rhonchi/wheezing/rales. Adequate chest expansion HEART: RRR, normal s1 and s2. ABDOMEN: Soft, nontender, no guarding, no peritoneal signs, and nondistended. BS +. No masses.   Imaging/Labs: as above     Latest Ref Rng & Units 05/14/2024   10:58 AM 04/04/2024    7:12 PM 01/30/2024    4:28 AM  CBC  WBC 4.0 - 10.5 K/uL 3.0  3.9  3.8   Hemoglobin 13.0 - 17.0 g/dL 86.2  86.7  89.1   Hematocrit 39.0 - 52.0 % 40.1  37.6  32.1   Platelets 150 - 400 K/uL 93  112  84    Lab Results  Component Value Date   IRON 104 05/14/2024   TIBC 281 05/14/2024   FERRITIN 208 05/14/2024    I personally reviewed and interpreted the available labs, imaging and endoscopic files.  04/2024  IMPRESSION: 1. Status post cholecystectomy. Simple attenuation fluid collection in the gallbladder fossa measuring 4.5 x 3.5 cm. This could reflect postoperative seroma or biloma. Presence or absence of infection is not established by imaging. 2. Evidence of laparoscopic access of the umbilicus. No focal fluid collection. 3. Large burden of stool and stool balls throughout the colon and rectum. 4. Cirrhosis, splenomegaly and small gastroesophageal varices. Trace ascites. 5. Anasarca. 6. Large left varicocele.  Impression and Plan: JAVARES KAUFHOLD is a 68 y.o. male Rmale with past medical history of NASH cirrhosis complicated by grade 3 esophgeal varices, Type II GOV varices,  GERD complicated by Barrett's esophagus without dysplasia, here for follow-up for decompensated cirrhosis  # Decompensated cirrhosis with ascites and large varices MELD: 15  #Eitology : MASLD  # Hepatic encephalopathy -Normal exam but imaging with large stool burden and intermittently constipated we will start the patient on lactulose  to Titrate lactulose  to achieve 2-3 Bms per day  - Avoid opiates or benzodiazepines  # Ascites - Low sodium diet - Diuretics furosemide  200 spironolactone  Previously large ascites  # Esophageal varices - Last EGD 2022 with large varices and GOV2.  Patient previously did not tolerate beta-blocker but recently restarted carvedilol  6.25 twice daily  # HCC screening - Last abdominal imaging: 04/2024  - Last AFP: 1.8 (06/2024)   Recommendation   -Patient reports ultrasound scheduled at [facility] 2026 - Check CBC, MELD labs and AFP in next 6 months - Reduce salt intake to <2 g per day - Can take Tylenol  max of 2 g per day (650 mg q8h) for pain - Avoid NSAIDs for pain - Avoid eating raw oysters/shellfish - Protein shake (Ensure or Boost) every night before going to sleep - Continue furosemide  80 mg qday - Continue spironolactone  200 mg qday -Start lactulose  -Continue PPI for history of short segment Barrett's esophagus -Patient has very high risk for esophageal varices bleed given last EGD demonstrating grade 3 esophageal varices and gastric varices, and patient previously was not able to tolerate beta-blockers.  As with previous documentation and clinic visit with Dr. Eartha I again revisited the possibility of TIPS/BRTO which patient refused; patient has restarted carvedilol  and seem to be tolerating it now which is a good news.  Although he is taking only once daily and advised him to take it twice daily -Can stop iron supplementation as recent iron profile with iron deficiency - Continue following at [facility] hepatology  All questions were answered.       Emberly Tomasso Faizan Rowene Suto, MD Gastroenterology and Hepatology [facility]   This chart has been completed using [facility] Dictation software, and while attempts have been made to ensure accuracy , certain words and phrases may not be transcribed as intended      [1]  Social History Tobacco Use  Smoking Status Former   Current packs/day: 0.00   Average packs/day: 0.3 packs/day for 3.0 years (0.8 ttl pk-yrs)   Types: Cigarettes   Start date: 10/10/1971   Quit date: 10/10/1974  Years since quitting: 49.7   Passive exposure: Past  Smokeless Tobacco Never  [2]  Allergies Allergen Reactions   Asa [Aspirin] Shortness Of Breath    asthma symptoms   Penicillins Shortness Of Breath    Has patient had a PCN reaction causing immediate rash, facial/tongue/throat swelling, SOB or lightheadedness with hypotension: Yes Has patient had a PCN reaction causing severe rash involving mucus membranes or skin necrosis: No Has patient had a PCN reaction that required hospitalization No Has patient had a PCN reaction occurring within the last 10 years: No If all of the above answers are NO, then may proceed with Cephalosporin use. asthma symptoms Patient has tolerated cefazolin , ceftriaxo   Vancomycin  Anaphylaxis    Immediately following being turned into prone position and Vancomycin  administration in the OR patient cardiac arrest w/  Vfib.

## 2024-06-29 ENCOUNTER — Encounter: Payer: Self-pay | Admitting: *Deleted

## 2024-07-21 ENCOUNTER — Encounter: Payer: Self-pay | Admitting: Oncology

## 2024-08-20 ENCOUNTER — Other Ambulatory Visit

## 2024-08-31 ENCOUNTER — Ambulatory Visit: Admitting: Urology

## 2024-11-12 ENCOUNTER — Inpatient Hospital Stay

## 2024-11-19 ENCOUNTER — Inpatient Hospital Stay: Admitting: Oncology
